# Patient Record
Sex: Female | Born: 1935
Health system: Southern US, Community
[De-identification: ages and names within clinical notes are randomized; demographics above are authoritative.]

## PROBLEM LIST (undated history)

## (undated) DIAGNOSIS — I639 Cerebral infarction, unspecified: Secondary | ICD-10-CM

## (undated) DIAGNOSIS — M949 Disorder of cartilage, unspecified: Secondary | ICD-10-CM

## (undated) DIAGNOSIS — K589 Irritable bowel syndrome without diarrhea: Secondary | ICD-10-CM

## (undated) DIAGNOSIS — I2510 Atherosclerotic heart disease of native coronary artery without angina pectoris: Secondary | ICD-10-CM

## (undated) DIAGNOSIS — I4729 Other ventricular tachycardia: Secondary | ICD-10-CM

## (undated) DIAGNOSIS — M899 Disorder of bone, unspecified: Secondary | ICD-10-CM

## (undated) DIAGNOSIS — Z9889 Other specified postprocedural states: Secondary | ICD-10-CM

## (undated) DIAGNOSIS — I5022 Chronic systolic (congestive) heart failure: Secondary | ICD-10-CM

## (undated) DIAGNOSIS — M1990 Unspecified osteoarthritis, unspecified site: Secondary | ICD-10-CM

## (undated) DIAGNOSIS — R112 Nausea with vomiting, unspecified: Secondary | ICD-10-CM

## (undated) DIAGNOSIS — I10 Essential (primary) hypertension: Secondary | ICD-10-CM

## (undated) DIAGNOSIS — I447 Left bundle-branch block, unspecified: Secondary | ICD-10-CM

## (undated) DIAGNOSIS — I351 Nonrheumatic aortic (valve) insufficiency: Secondary | ICD-10-CM

## (undated) DIAGNOSIS — E559 Vitamin D deficiency, unspecified: Secondary | ICD-10-CM

## (undated) DIAGNOSIS — K219 Gastro-esophageal reflux disease without esophagitis: Secondary | ICD-10-CM

## (undated) DIAGNOSIS — R55 Syncope and collapse: Secondary | ICD-10-CM

## (undated) DIAGNOSIS — R7303 Prediabetes: Secondary | ICD-10-CM

## (undated) DIAGNOSIS — T4145XA Adverse effect of unspecified anesthetic, initial encounter: Secondary | ICD-10-CM

## (undated) DIAGNOSIS — J309 Allergic rhinitis, unspecified: Secondary | ICD-10-CM

## (undated) DIAGNOSIS — I472 Ventricular tachycardia: Secondary | ICD-10-CM

## (undated) DIAGNOSIS — I4891 Unspecified atrial fibrillation: Secondary | ICD-10-CM

## (undated) DIAGNOSIS — E785 Hyperlipidemia, unspecified: Secondary | ICD-10-CM

## (undated) DIAGNOSIS — H269 Unspecified cataract: Secondary | ICD-10-CM

## (undated) DIAGNOSIS — M7700 Medial epicondylitis, unspecified elbow: Secondary | ICD-10-CM

## (undated) HISTORY — DX: Disorder of cartilage, unspecified: M94.9

## (undated) HISTORY — DX: Medial epicondylitis, unspecified elbow: M77.00

## (undated) HISTORY — DX: Cerebral infarction, unspecified: I63.9

## (undated) HISTORY — DX: Ventricular tachycardia: I47.2

## (undated) HISTORY — PX: REPLACEMENT TOTAL KNEE: SUR1224

## (undated) HISTORY — DX: Hyperlipidemia, unspecified: E78.5

## (undated) HISTORY — DX: Unspecified cataract: H26.9

## (undated) HISTORY — DX: Other ventricular tachycardia: I47.29

## (undated) HISTORY — DX: Left bundle-branch block, unspecified: I44.7

## (undated) HISTORY — DX: Chronic systolic (congestive) heart failure: I50.22

## (undated) HISTORY — DX: Disorder of bone, unspecified: M89.9

## (undated) HISTORY — DX: Syncope and collapse: R55

## (undated) HISTORY — DX: Vitamin D deficiency, unspecified: E55.9

## (undated) HISTORY — DX: Atherosclerotic heart disease of native coronary artery without angina pectoris: I25.10

## (undated) HISTORY — DX: Essential (primary) hypertension: I10

## (undated) HISTORY — DX: Unspecified osteoarthritis, unspecified site: M19.90

## (undated) HISTORY — DX: Allergic rhinitis, unspecified: J30.9

## (undated) HISTORY — DX: Gastro-esophageal reflux disease without esophagitis: K21.9

## (undated) HISTORY — DX: Nonrheumatic aortic (valve) insufficiency: I35.1

## (undated) HISTORY — DX: Unspecified atrial fibrillation: I48.91

## (undated) HISTORY — PX: RADICAL HYSTERECTOMY: SHX2283

## (undated) HISTORY — DX: Irritable bowel syndrome, unspecified: K58.9

---

## 1999-01-03 ENCOUNTER — Ambulatory Visit (HOSPITAL_COMMUNITY): Admission: RE | Admit: 1999-01-03 | Discharge: 1999-01-03 | Payer: Self-pay | Admitting: Family Medicine

## 1999-01-03 ENCOUNTER — Encounter: Payer: Self-pay | Admitting: Family Medicine

## 2000-01-22 ENCOUNTER — Ambulatory Visit (HOSPITAL_COMMUNITY): Admission: RE | Admit: 2000-01-22 | Discharge: 2000-01-22 | Payer: Self-pay | Admitting: Family Medicine

## 2000-01-22 ENCOUNTER — Encounter: Payer: Self-pay | Admitting: Family Medicine

## 2000-04-26 ENCOUNTER — Ambulatory Visit (HOSPITAL_COMMUNITY): Admission: RE | Admit: 2000-04-26 | Discharge: 2000-04-26 | Payer: Self-pay | Admitting: Gastroenterology

## 2000-04-26 ENCOUNTER — Encounter: Payer: Self-pay | Admitting: Gastroenterology

## 2001-01-29 ENCOUNTER — Encounter: Payer: Self-pay | Admitting: Family Medicine

## 2001-01-29 ENCOUNTER — Ambulatory Visit (HOSPITAL_COMMUNITY): Admission: RE | Admit: 2001-01-29 | Discharge: 2001-01-29 | Payer: Self-pay | Admitting: Family Medicine

## 2002-02-11 ENCOUNTER — Encounter: Admission: RE | Admit: 2002-02-11 | Discharge: 2002-02-11 | Payer: Self-pay | Admitting: Family Medicine

## 2002-02-11 ENCOUNTER — Ambulatory Visit (HOSPITAL_COMMUNITY): Admission: RE | Admit: 2002-02-11 | Discharge: 2002-02-11 | Payer: Self-pay | Admitting: Family Medicine

## 2002-02-11 ENCOUNTER — Encounter: Payer: Self-pay | Admitting: Family Medicine

## 2003-02-04 ENCOUNTER — Ambulatory Visit (HOSPITAL_COMMUNITY): Admission: RE | Admit: 2003-02-04 | Discharge: 2003-02-04 | Payer: Self-pay | Admitting: Gastroenterology

## 2003-04-14 ENCOUNTER — Ambulatory Visit (HOSPITAL_COMMUNITY): Admission: RE | Admit: 2003-04-14 | Discharge: 2003-04-14 | Payer: Self-pay | Admitting: Family Medicine

## 2003-04-14 ENCOUNTER — Encounter: Payer: Self-pay | Admitting: Family Medicine

## 2004-06-21 ENCOUNTER — Ambulatory Visit (HOSPITAL_COMMUNITY): Admission: RE | Admit: 2004-06-21 | Discharge: 2004-06-21 | Payer: Self-pay | Admitting: Family Medicine

## 2004-11-15 ENCOUNTER — Encounter: Admission: RE | Admit: 2004-11-15 | Discharge: 2004-11-15 | Payer: Self-pay | Admitting: Family Medicine

## 2005-07-24 ENCOUNTER — Ambulatory Visit (HOSPITAL_COMMUNITY): Admission: RE | Admit: 2005-07-24 | Discharge: 2005-07-24 | Payer: Self-pay | Admitting: Family Medicine

## 2006-06-14 ENCOUNTER — Ambulatory Visit: Payer: Self-pay | Admitting: Family Medicine

## 2006-06-26 ENCOUNTER — Encounter: Payer: Self-pay | Admitting: Family Medicine

## 2006-06-26 LAB — CONVERTED CEMR LAB: LDL Cholesterol: 133 mg/dL

## 2006-07-16 DIAGNOSIS — M810 Age-related osteoporosis without current pathological fracture: Secondary | ICD-10-CM | POA: Insufficient documentation

## 2006-07-16 DIAGNOSIS — K219 Gastro-esophageal reflux disease without esophagitis: Secondary | ICD-10-CM

## 2006-07-16 DIAGNOSIS — I10 Essential (primary) hypertension: Secondary | ICD-10-CM | POA: Insufficient documentation

## 2006-07-16 DIAGNOSIS — K589 Irritable bowel syndrome without diarrhea: Secondary | ICD-10-CM | POA: Insufficient documentation

## 2006-07-16 DIAGNOSIS — M899 Disorder of bone, unspecified: Secondary | ICD-10-CM | POA: Insufficient documentation

## 2006-07-16 DIAGNOSIS — J309 Allergic rhinitis, unspecified: Secondary | ICD-10-CM | POA: Insufficient documentation

## 2006-07-16 DIAGNOSIS — M949 Disorder of cartilage, unspecified: Secondary | ICD-10-CM

## 2006-07-29 ENCOUNTER — Ambulatory Visit (HOSPITAL_COMMUNITY): Admission: RE | Admit: 2006-07-29 | Discharge: 2006-07-29 | Payer: Self-pay | Admitting: Family Medicine

## 2006-08-13 ENCOUNTER — Encounter: Payer: Self-pay | Admitting: Family Medicine

## 2006-08-13 ENCOUNTER — Ambulatory Visit: Payer: Self-pay | Admitting: Family Medicine

## 2006-08-13 DIAGNOSIS — E785 Hyperlipidemia, unspecified: Secondary | ICD-10-CM

## 2006-08-13 LAB — CONVERTED CEMR LAB
Cholesterol, target level: 200 mg/dL
HDL goal, serum: 40 mg/dL

## 2006-10-15 ENCOUNTER — Encounter: Payer: Self-pay | Admitting: Family Medicine

## 2006-11-28 ENCOUNTER — Encounter: Payer: Self-pay | Admitting: Family Medicine

## 2006-12-04 ENCOUNTER — Ambulatory Visit: Payer: Self-pay | Admitting: Family Medicine

## 2007-01-01 ENCOUNTER — Telehealth (INDEPENDENT_AMBULATORY_CARE_PROVIDER_SITE_OTHER): Payer: Self-pay | Admitting: *Deleted

## 2007-01-01 ENCOUNTER — Ambulatory Visit: Payer: Self-pay | Admitting: Family Medicine

## 2007-01-01 DIAGNOSIS — M19071 Primary osteoarthritis, right ankle and foot: Secondary | ICD-10-CM

## 2007-01-01 DIAGNOSIS — M19072 Primary osteoarthritis, left ankle and foot: Secondary | ICD-10-CM

## 2007-01-03 ENCOUNTER — Telehealth (INDEPENDENT_AMBULATORY_CARE_PROVIDER_SITE_OTHER): Payer: Self-pay | Admitting: *Deleted

## 2007-01-15 ENCOUNTER — Telehealth: Payer: Self-pay | Admitting: Family Medicine

## 2007-01-23 ENCOUNTER — Encounter: Payer: Self-pay | Admitting: Vascular Surgery

## 2007-01-23 ENCOUNTER — Ambulatory Visit: Payer: Self-pay | Admitting: Vascular Surgery

## 2007-01-23 ENCOUNTER — Ambulatory Visit (HOSPITAL_COMMUNITY): Admission: RE | Admit: 2007-01-23 | Discharge: 2007-01-23 | Payer: Self-pay | Admitting: Family Medicine

## 2007-01-24 ENCOUNTER — Encounter: Payer: Self-pay | Admitting: Family Medicine

## 2007-01-24 ENCOUNTER — Telehealth (INDEPENDENT_AMBULATORY_CARE_PROVIDER_SITE_OTHER): Payer: Self-pay | Admitting: *Deleted

## 2007-01-24 LAB — CONVERTED CEMR LAB: Creatinine, Ser: 0.74 mg/dL (ref 0.40–1.20)

## 2007-01-27 ENCOUNTER — Telehealth (INDEPENDENT_AMBULATORY_CARE_PROVIDER_SITE_OTHER): Payer: Self-pay | Admitting: *Deleted

## 2007-02-18 ENCOUNTER — Ambulatory Visit: Payer: Self-pay | Admitting: Family Medicine

## 2007-02-20 ENCOUNTER — Telehealth: Payer: Self-pay | Admitting: Family Medicine

## 2007-05-16 ENCOUNTER — Ambulatory Visit: Payer: Self-pay | Admitting: Family Medicine

## 2007-05-16 LAB — CONVERTED CEMR LAB: Rapid Strep: NEGATIVE

## 2007-05-19 ENCOUNTER — Encounter: Admission: RE | Admit: 2007-05-19 | Discharge: 2007-05-19 | Payer: Self-pay | Admitting: Orthopedic Surgery

## 2007-05-19 ENCOUNTER — Encounter: Payer: Self-pay | Admitting: Family Medicine

## 2007-05-26 ENCOUNTER — Telehealth: Payer: Self-pay | Admitting: Family Medicine

## 2007-06-26 ENCOUNTER — Ambulatory Visit: Payer: Self-pay | Admitting: Family Medicine

## 2007-06-27 ENCOUNTER — Encounter: Payer: Self-pay | Admitting: Family Medicine

## 2007-06-27 LAB — CONVERTED CEMR LAB
Albumin: 4 g/dL (ref 3.5–5.2)
Alkaline Phosphatase: 62 units/L (ref 39–117)
BUN: 16 mg/dL (ref 6–23)
CO2: 24 meq/L (ref 19–32)
Glucose, Bld: 90 mg/dL (ref 70–99)
HDL: 65 mg/dL (ref >39)
Potassium: 3.9 meq/L (ref 3.5–5.3)
TSH: 1.909 microintl units/mL (ref 0.350–5.50)
Total Bilirubin: 0.6 mg/dL (ref 0.3–1.2)
Total Protein: 7.3 g/dL (ref 6.0–8.3)
VLDL: 16 mg/dL (ref 0–40)

## 2007-08-05 ENCOUNTER — Ambulatory Visit: Payer: Self-pay | Admitting: Family Medicine

## 2007-08-06 ENCOUNTER — Encounter: Payer: Self-pay | Admitting: Family Medicine

## 2007-08-06 ENCOUNTER — Encounter: Admission: RE | Admit: 2007-08-06 | Discharge: 2007-08-06 | Payer: Self-pay | Admitting: Family Medicine

## 2007-09-19 ENCOUNTER — Telehealth (INDEPENDENT_AMBULATORY_CARE_PROVIDER_SITE_OTHER): Payer: Self-pay | Admitting: *Deleted

## 2007-09-19 ENCOUNTER — Encounter: Payer: Self-pay | Admitting: Family Medicine

## 2007-09-19 ENCOUNTER — Ambulatory Visit: Payer: Self-pay | Admitting: Family Medicine

## 2007-09-19 DIAGNOSIS — R9431 Abnormal electrocardiogram [ECG] [EKG]: Secondary | ICD-10-CM

## 2007-09-24 ENCOUNTER — Ambulatory Visit: Payer: Self-pay

## 2007-09-24 ENCOUNTER — Encounter: Payer: Self-pay | Admitting: Family Medicine

## 2007-09-26 ENCOUNTER — Ambulatory Visit: Payer: Self-pay

## 2007-10-09 DIAGNOSIS — T8859XA Other complications of anesthesia, initial encounter: Secondary | ICD-10-CM

## 2007-10-09 HISTORY — DX: Other complications of anesthesia, initial encounter: T88.59XA

## 2007-10-15 ENCOUNTER — Telehealth: Payer: Self-pay | Admitting: Family Medicine

## 2007-10-29 ENCOUNTER — Telehealth: Payer: Self-pay | Admitting: Family Medicine

## 2007-11-03 ENCOUNTER — Encounter: Payer: Self-pay | Admitting: Family Medicine

## 2007-11-03 ENCOUNTER — Inpatient Hospital Stay (HOSPITAL_COMMUNITY): Admission: RE | Admit: 2007-11-03 | Discharge: 2007-11-06 | Payer: Self-pay | Admitting: Orthopedic Surgery

## 2008-01-07 ENCOUNTER — Ambulatory Visit: Payer: Self-pay | Admitting: Family Medicine

## 2008-01-08 ENCOUNTER — Encounter: Payer: Self-pay | Admitting: Family Medicine

## 2008-01-08 LAB — CONVERTED CEMR LAB
ALT: 15 units/L (ref 0–35)
AST: 17 units/L (ref 0–37)
Albumin: 4.4 g/dL (ref 3.5–5.2)
Alkaline Phosphatase: 79 units/L (ref 39–117)
Calcium: 10 mg/dL (ref 8.4–10.5)
Cholesterol: 218 mg/dL — ABNORMAL HIGH (ref 0–200)
Sodium: 139 meq/L (ref 135–145)
Total Bilirubin: 0.5 mg/dL (ref 0.3–1.2)
Total CHOL/HDL Ratio: 3.9
Total Protein: 7.9 g/dL (ref 6.0–8.3)
VLDL: 25 mg/dL (ref 0–40)

## 2008-03-29 ENCOUNTER — Inpatient Hospital Stay (HOSPITAL_COMMUNITY): Admission: RE | Admit: 2008-03-29 | Discharge: 2008-04-01 | Payer: Self-pay | Admitting: Orthopedic Surgery

## 2008-04-19 ENCOUNTER — Encounter: Payer: Self-pay | Admitting: Family Medicine

## 2008-04-28 ENCOUNTER — Ambulatory Visit: Payer: Self-pay | Admitting: Family Medicine

## 2008-04-29 ENCOUNTER — Encounter: Admission: RE | Admit: 2008-04-29 | Discharge: 2008-07-28 | Payer: Self-pay | Admitting: Orthopedic Surgery

## 2008-05-11 ENCOUNTER — Ambulatory Visit: Payer: Self-pay | Admitting: Family Medicine

## 2008-08-10 ENCOUNTER — Ambulatory Visit: Payer: Self-pay | Admitting: Family Medicine

## 2008-08-23 ENCOUNTER — Encounter: Admission: RE | Admit: 2008-08-23 | Discharge: 2008-08-23 | Payer: Self-pay | Admitting: Family Medicine

## 2008-09-29 ENCOUNTER — Encounter: Payer: Self-pay | Admitting: Family Medicine

## 2008-10-27 ENCOUNTER — Encounter: Payer: Self-pay | Admitting: Family Medicine

## 2009-07-25 ENCOUNTER — Ambulatory Visit: Payer: Self-pay | Admitting: Family Medicine

## 2009-07-26 LAB — CONVERTED CEMR LAB
AST: 19 units/L (ref 0–37)
BUN: 21 mg/dL (ref 6–23)
CO2: 23 meq/L (ref 19–32)
Creatinine, Ser: 0.72 mg/dL (ref 0.40–1.20)
Glucose, Bld: 104 mg/dL — ABNORMAL HIGH (ref 70–99)
Sodium: 142 meq/L (ref 135–145)
TSH: 1.872 microintl units/mL (ref 0.350–4.500)
Total Protein: 7.5 g/dL (ref 6.0–8.3)
VLDL: 18 mg/dL (ref 0–40)

## 2009-09-13 ENCOUNTER — Encounter: Admission: RE | Admit: 2009-09-13 | Discharge: 2009-09-13 | Payer: Self-pay | Admitting: Family Medicine

## 2009-09-21 ENCOUNTER — Encounter: Payer: Self-pay | Admitting: Family Medicine

## 2009-10-20 ENCOUNTER — Ambulatory Visit: Payer: Self-pay | Admitting: Family Medicine

## 2009-10-20 DIAGNOSIS — M7700 Medial epicondylitis, unspecified elbow: Secondary | ICD-10-CM

## 2009-11-24 ENCOUNTER — Ambulatory Visit: Payer: Self-pay | Admitting: Family Medicine

## 2009-12-06 ENCOUNTER — Encounter: Payer: Self-pay | Admitting: Family Medicine

## 2009-12-07 LAB — CONVERTED CEMR LAB
BUN: 20 mg/dL (ref 6–23)
CO2: 24 meq/L (ref 19–32)
Calcium: 10.1 mg/dL (ref 8.4–10.5)
Chloride: 103 meq/L (ref 96–112)
Creatinine, Ser: 0.69 mg/dL (ref 0.40–1.20)
Glucose, Bld: 103 mg/dL — ABNORMAL HIGH (ref 70–99)
Potassium: 4.3 meq/L (ref 3.5–5.3)
Sodium: 140 meq/L (ref 135–145)

## 2009-12-19 ENCOUNTER — Ambulatory Visit: Payer: Self-pay | Admitting: Family Medicine

## 2010-01-13 ENCOUNTER — Encounter: Payer: Self-pay | Admitting: Family Medicine

## 2010-01-14 LAB — CONVERTED CEMR LAB
Creatinine, Ser: 0.61 mg/dL (ref 0.40–1.20)
Potassium: 4.2 meq/L (ref 3.5–5.3)
Sodium: 138 meq/L (ref 135–145)

## 2010-02-06 ENCOUNTER — Telehealth: Payer: Self-pay | Admitting: Family Medicine

## 2010-02-13 ENCOUNTER — Encounter: Payer: Self-pay | Admitting: Family Medicine

## 2010-02-28 ENCOUNTER — Ambulatory Visit: Payer: Self-pay | Admitting: Family

## 2010-02-28 DIAGNOSIS — R609 Edema, unspecified: Secondary | ICD-10-CM | POA: Insufficient documentation

## 2010-03-15 ENCOUNTER — Ambulatory Visit: Payer: Self-pay | Admitting: Family Medicine

## 2010-04-20 DIAGNOSIS — H269 Unspecified cataract: Secondary | ICD-10-CM

## 2010-04-20 DIAGNOSIS — M1990 Unspecified osteoarthritis, unspecified site: Secondary | ICD-10-CM | POA: Insufficient documentation

## 2010-04-24 ENCOUNTER — Ambulatory Visit: Payer: Self-pay | Admitting: Cardiovascular Disease

## 2010-04-24 DIAGNOSIS — R0602 Shortness of breath: Secondary | ICD-10-CM | POA: Insufficient documentation

## 2010-04-24 DIAGNOSIS — R55 Syncope and collapse: Secondary | ICD-10-CM

## 2010-05-10 ENCOUNTER — Encounter: Payer: Self-pay | Admitting: Family Medicine

## 2010-05-22 ENCOUNTER — Encounter: Payer: Self-pay | Admitting: Family Medicine

## 2010-05-29 ENCOUNTER — Ambulatory Visit: Payer: Self-pay

## 2010-05-29 ENCOUNTER — Ambulatory Visit (HOSPITAL_COMMUNITY): Admission: RE | Admit: 2010-05-29 | Discharge: 2010-05-29 | Payer: Self-pay | Admitting: Cardiovascular Disease

## 2010-05-29 ENCOUNTER — Ambulatory Visit: Payer: Self-pay | Admitting: Cardiovascular Disease

## 2010-05-29 ENCOUNTER — Encounter: Payer: Self-pay | Admitting: Cardiovascular Disease

## 2010-05-29 ENCOUNTER — Encounter (INDEPENDENT_AMBULATORY_CARE_PROVIDER_SITE_OTHER): Payer: Self-pay

## 2010-05-29 ENCOUNTER — Ambulatory Visit: Payer: Self-pay | Admitting: Internal Medicine

## 2010-05-30 ENCOUNTER — Encounter: Payer: Self-pay | Admitting: Cardiovascular Disease

## 2010-05-31 ENCOUNTER — Ambulatory Visit: Payer: Self-pay | Admitting: Cardiovascular Disease

## 2010-05-31 ENCOUNTER — Encounter: Payer: Self-pay | Admitting: Cardiovascular Disease

## 2010-05-31 DIAGNOSIS — I472 Ventricular tachycardia: Secondary | ICD-10-CM

## 2010-05-31 LAB — CONVERTED CEMR LAB
BUN: 22 mg/dL (ref 6–23)
Basophils Absolute: 0 10*3/uL (ref 0.0–0.1)
Basophils Relative: 0.1 % (ref 0.0–3.0)
CO2: 28 meq/L (ref 19–32)
Chloride: 102 meq/L (ref 96–112)
Eosinophils Relative: 2.1 % (ref 0.0–5.0)
Glucose, Bld: 86 mg/dL (ref 70–99)
Hemoglobin: 13.1 g/dL (ref 12.0–15.0)
Lymphocytes Relative: 32.3 % (ref 12.0–46.0)
Lymphs Abs: 1.8 10*3/uL (ref 0.7–4.0)
MCV: 89.8 fL (ref 78.0–100.0)
Neutro Abs: 3.2 10*3/uL (ref 1.4–7.7)
Potassium: 3.5 meq/L (ref 3.5–5.1)
RBC: 4.2 M/uL (ref 3.87–5.11)
RDW: 13.3 % (ref 11.5–14.6)
WBC: 5.5 10*3/uL (ref 4.5–10.5)

## 2010-06-07 ENCOUNTER — Inpatient Hospital Stay (HOSPITAL_BASED_OUTPATIENT_CLINIC_OR_DEPARTMENT_OTHER): Admission: RE | Admit: 2010-06-07 | Discharge: 2010-06-07 | Payer: Self-pay | Admitting: Cardiovascular Disease

## 2010-06-07 ENCOUNTER — Ambulatory Visit: Payer: Self-pay | Admitting: Cardiovascular Disease

## 2010-07-03 ENCOUNTER — Ambulatory Visit: Payer: Self-pay | Admitting: Internal Medicine

## 2010-07-24 ENCOUNTER — Encounter: Payer: Self-pay | Admitting: Family Medicine

## 2010-10-08 HISTORY — PX: CATARACT EXTRACTION, BILATERAL: SHX1313

## 2010-10-26 ENCOUNTER — Ambulatory Visit
Admission: RE | Admit: 2010-10-26 | Discharge: 2010-10-26 | Payer: Self-pay | Source: Home / Self Care | Attending: Cardiovascular Disease | Admitting: Cardiovascular Disease

## 2010-10-31 ENCOUNTER — Ambulatory Visit
Admission: RE | Admit: 2010-10-31 | Discharge: 2010-10-31 | Payer: Self-pay | Source: Home / Self Care | Attending: Family Medicine | Admitting: Family Medicine

## 2010-10-31 DIAGNOSIS — E559 Vitamin D deficiency, unspecified: Secondary | ICD-10-CM | POA: Insufficient documentation

## 2010-11-03 ENCOUNTER — Other Ambulatory Visit: Payer: Self-pay | Admitting: Family Medicine

## 2010-11-03 DIAGNOSIS — Z1239 Encounter for other screening for malignant neoplasm of breast: Secondary | ICD-10-CM

## 2010-11-06 ENCOUNTER — Encounter: Payer: Self-pay | Admitting: Family Medicine

## 2010-11-07 NOTE — Assessment & Plan Note (Signed)
Summary: CARDIAC EVAUL/MT   Primary Provider:  [name] MD  CC:  BP issues .  History of Present Illness: 75 yo overweight patient with HTN, elevated lipids and borderline DM.  Recent dizzyness and near syncope while on amlodipine.  Previous preop eval with normal myovue 2008.  Reviewed ECG from then and normal with no conduction defect.  ECG no with LBBB.  Has chronic dyspnea likely due to obesity.  Chronic LE edema with venous insuf on lasix.  Started on bystolic after she didnt' tolerate amlodipine.  Denies SSCP but has significant dyspnea, with abnormal ECG and multiple CRF's.  No recurrent dizzyness while of Calcium blocker.    Current Problems (verified): 1)  Syncope  (ICD-780.2) 2)  Dyspnea  (ICD-786.05) 3)  Hyperlipidemia, Mild  (ICD-272.4) 4)  Hypertension, Benign Systemic  (ICD-401.1) 5)  Edema  (ICD-782.3) 6)  Medial Epicondylitis  (ICD-726.31) 7)  Electrocardiogram, Abnormal  (ICD-794.31) 8)  Osteoarthrosis Nos, Lower Leg  (ICD-715.96) 9)  Rhinitis, Allergic  (ICD-477.9) 10)  Osteopenia  (ICD-733.90) 11)  Irritable Bowel Syndrome  (ICD-564.1) 12)  Gastroesophageal Reflux, No Esophagitis  (ICD-530.81) 13)  Cataracts  (ICD-366.9) 14)  Osteoarthritis  (ICD-715.90)  Current Medications (verified): 1)  Fluticasone Propionate 50 Mcg/act Susp (Fluticasone Propionate) .... Two Sprays Each Nostril Daily 2)  Aspirin 325 Mg Tabs (Aspirin) .Marland Kitchen.. 1 Once Daily 3)  Losartan Potassium-Hctz 100-25 Mg Tabs (Losartan Potassium-Hctz) .... One Tablet By Mouth Daily 4)  Cetirizine Hcl 10 Mg Tabs (Cetirizine Hcl) .... Take 1 Tablet By Mouth Once A Day 5)  Metamucil 30.9 % Powd (Psyllium) .Marland Kitchen.. 1 Rounded Tsp 6)  Multivitamins  Tabs (Multiple Vitamin) .... Take 1 Tablet By Mouth Once A Day 7)  Bystolic 10 Mg Tabs (Nebivolol Hcl) .Marland Kitchen.. 1 Tab By Mouth Once Daily 8)  Calcium-Magnesium-Zinc 333-133-8.3 Mg Tabs (Calcium-Magnesium-Zinc) .... Take 4 Tablets A Day By Mouth 9)  Vitamin D-1000 Max  St [phone] Mg-Unit Tabs (Calcium-Vitamin D) .Marland Kitchen.. 1 Every Other Day 10)  Fish Oil 1200 Mg Caps (Omega-3 Fatty Acids) .... Take 2 Tablets By Mouth in Am and Pm 11)  Cyanocobalamin 1000 Mcg Tabs (Cyanocobalamin) .... Take One Tablet By Mouth Once A Day 12)  Omeprazole 40 Mg Cpdr (Omeprazole) .... Take 1 Tablet By Mouth Once A Day 13)  Pravastatin Sodium 20 Mg Tabs (Pravastatin Sodium) .... Take 1 Tablet By Mouth Once A Day  At Bedtime 14)  Grape Seed Extract 146-60 Mg Tabs (Misc Natural Products) .... Take One Tablet By Mouth Twice A Day 15)  Glucosamine Sulfate 750 Mg Caps (Glucosamine Sulfate) .... Take One Tablet By Mouth Once A Day 16)  Lasix 20 Mg Tabs (Furosemide) .... Take 1 Tablet By Mouth Once A Day in The Am Only As Needed For Lower Extremity Swelling  Allergies (verified): 1)  ! Exforge (Amlodipine Besylate-Valsartan)  Past History:  Past Medical History: Last updated: 01/01/2007 ABIs on 11-28-06, Right 1.4, left 1.35  Past Surgical History: Last updated: 08/10/2008 Total Hysterectomy -  Right Total KNee Replacement 11-03-07 Left knee replacement 03-29-08  Family History: Last updated: 08/10/2008 Colon Ca-Father  Parent-DM, hi chol, HTN, afib  mother with alzheimers, afib  Father with MI at age 45 Sister with DM, afib  Social History: Last updated: 06/26/2007 Retired from [employer].  12 yrs education.  Divorced from [employer].  One child.  Quit smoking 1972, no EtOH, no drugs, 2 caff/day, works out in the yard.   Review of Systems  Denies fever, malais, weight loss, blurry vision, decreased visual acuity, cough, sputum, SOB, hemoptysis, pleuritic pain, palpitaitons, heartburn, abdominal pain, melena, lower extremity edema, claudication, or rash.   Vital Signs:  Patient profile:   75 year old female Height:      62 inches Weight:      279 pounds BMI:     51.21 Pulse rate:   60 / minute Resp:     16 per minute BP sitting:   156 / 85  (left arm)  Vitals  Entered By: Kem Parkinson (April 24, 2010 9:02 AM)  Physical Exam  General:  Affect appropriate Healthy:  appears stated age HEENT: normal Neck supple with no adenopathy JVP normal no bruits no thyromegaly Lungs clear with no wheezing and good diaphragmatic motion Heart:  S1/S2 no murmur,rub, gallop or click PMI normal Abdomen: benighn, BS positve, no tenderness, no AAA no bruit.  No HSM or HJR Distal pulses intact with no bruits Plus 2 bilateral edema Neuro non-focal Skin warm and dry    Impression & Recommendations:  Problem # 1:  SYNCOPE (ICD-780.2) Temp related to amlodipine. New LBBB will have to monitor conduction system and assess for structural heart problems.  Avoid use of both calcium blker and BB.   Her updated medication list for this problem includes:    Aspirin 325 Mg Tabs (Aspirin) .Marland Kitchen... 1 once daily    Bystolic 10 Mg Tabs (Nebivolol hcl) .Marland Kitchen... 1 tab by mouth once daily  Orders: Echocardiogram (Echo)  Problem # 2:  DYSPNEA (ICD-786.05) Likely related to obesity but check echo and R/O anginal equiv with myovue Her updated medication list for this problem includes:    Aspirin 325 Mg Tabs (Aspirin) .Marland Kitchen... 1 once daily    Losartan Potassium-hctz 100-25 Mg Tabs (Losartan potassium-hctz) ..... One tablet by mouth daily    Bystolic 10 Mg Tabs (Nebivolol hcl) .Marland Kitchen... 1 tab by mouth once daily    Lasix 20 Mg Tabs (Furosemide) .Marland Kitchen... Take 1 tablet by mouth once a day in the am only as needed for lower extremity swelling  Orders: EKG w/ Interpretation (93000) Echocardiogram (Echo)  Her updated medication list for this problem includes:    Aspirin 325 Mg Tabs (Aspirin) .Marland Kitchen... 1 once daily    Losartan Potassium-hctz 100-25 Mg Tabs (Losartan potassium-hctz) ..... One tablet by mouth daily    Bystolic 10 Mg Tabs (Nebivolol hcl) .Marland Kitchen... 1 tab by mouth once daily    Lasix 20 Mg Tabs (Furosemide) .Marland Kitchen... Take 1 tablet by mouth once a day in the am only as needed for lower  extremity swelling  Problem # 3:  HYPERLIPIDEMIA, MILD (ICD-272.4) Continue statin Her updated medication list for this problem includes:    Pravastatin Sodium 20 Mg Tabs (Pravastatin sodium) .Marland Kitchen... Take 1 tablet by mouth once a day  at bedtime  CHOL: 189 (07/25/2009)   LDL: 111 (07/25/2009)   HDL: 60 (07/25/2009)   TG: 92 (07/25/2009) CHOL (goal): 200 (08/13/2006)   LDL (goal): 130 (08/13/2006)   HDL (goal): 40 (08/13/2006)   TG (goal): 150 (08/13/2006)  Problem # 4:  HYPERTENSION, BENIGN SYSTEMIC (ICD-401.1) Borderline control.  Will see what echo and myovue look like before making further recomendations Her updated medication list for this problem includes:    Aspirin 325 Mg Tabs (Aspirin) .Marland Kitchen... 1 once daily    Losartan Potassium-hctz 100-25 Mg Tabs (Losartan potassium-hctz) ..... One tablet by mouth daily    Bystolic 10 Mg Tabs (Nebivolol hcl) .Marland Kitchen... 1 tab by mouth  once daily    Lasix 20 Mg Tabs (Furosemide) .Marland Kitchen... Take 1 tablet by mouth once a day in the am only as needed for lower extremity swelling  Problem # 5:  EDEMA (ICD-782.3) Venous insuf.  Continue low sodium diet and lasix  Problem # 6:  ELECTROCARDIOGRAM, ABNORMAL (ICD-794.31) LBBB with PR 208  No idication for pacer but clearly her conduction system has changed since 2008.  Avoid further AV nodal blocking drugs Event monitor if any further "syncope" or dizzyness Her updated medication list for this problem includes:    Aspirin 325 Mg Tabs (Aspirin) .Marland Kitchen... 1 once daily    Bystolic 10 Mg Tabs (Nebivolol hcl) .Marland Kitchen... 1 tab by mouth once daily  Other Orders: Nuclear Stress Test (Nuc Stress Test)  Patient Instructions: 1)  Your physician recommends that you schedule a follow-up appointment in: AFTER TESTS 2)  Your physician recommends that you continue on your current medications as directed. Please refer to the Current Medication list given to you today. 3)  Your physician has requested that you have an echocardiogram.   Echocardiography is a painless test that uses sound waves to create images of your heart. It provides your doctor with information about the size and shape of your heart and how well your heart's chambers and valves are working.  This procedure takes approximately one hour. There are no restrictions for this procedure. 4)  Your physician has requested that you have an lexiscan stress myoview.  For further information please visit [email or web address]  Please follow instruction sheet, as given.   EKG Report  Procedure date:  04/24/2010  Findings:      NSR 60 LBBB new since 2008 PR 208 msec

## 2010-11-07 NOTE — Assessment & Plan Note (Signed)
Summary: f/u on BP, Metheney pt- jr   Vital Signs:  Patient profile:   75 year old female Height:      62 inches Weight:      278 pounds Pulse rate:   67 / minute BP sitting:   196 / 81  (left arm) Cuff size:   large  Vitals Entered By: Kathlene November (Feb 28, 2010 1:46 PM) CC: followup BP-stopped Amlodipine and Lasix and started back on Losartan/HCTZ 100/25mg    Primary Care Provider:  [name] MD  CC:  followup BP-stopped Amlodipine and Lasix and started back on Losartan/HCTZ 100/25mg .  History of Present Illness: Charlotte [name] is a 75 year old female with history of HTN who presents with complaint of LE edema.  1) Edema-  Patient notes that she has history of lower extremity edema for a long time. (s/p B TKR 2009).  Swelling became worse in March around the time of her last visit.  Patient has since discontinued amlodipine.  She reported that with the discontinuation of amlodipine her LE edema improved.  She then stopped the lasix as she felt that this was making her dizzy.  She started back on Losartan/HCTZ.  Dizziness is better.     2)Neuropathy-  She has seen neurology (Dr Kristian Covey [facility]).  Patient  was diagnosed with RLS and neuropathy. She was given rx for RLS med (pramipexole) which she hasn't yet filled.    Current Medications (verified): 1)  Fluticasone Propionate 50 Mcg/act Susp (Fluticasone Propionate) .... Two Sprays Each Nostril Daily 2)  Aspirin 81 Mg Tbec (Aspirin) .... Take 4 Tablet By Mouth Once A Day 3)  Losartan Potassium 100 Mg Tabs (Losartan Potassium) .... Take 1 Tablet By Mouth Once A Day 4)  Cetirizine Hcl 10 Mg Tabs (Cetirizine Hcl) .... Take 1 Tablet By Mouth Once A Day 5)  Metamucil Tablets .... Take 3 Tablets By Mouth Once A Day 6)  Multivitamins  Tabs (Multiple Vitamin) .... Take 1 Tablet By Mouth Once A Day 7)  Corgard 40 Mg  Tabs (Nadolol) .Marland Kitchen.. 1 Tab By Mouth Daily 8)  Calcium-Magnesium-Zinc 333-133-8.3 Mg Tabs  (Calcium-Magnesium-Zinc) .... Take 4 Tablets A Day By Mouth 9)  Vitamin D-1000 Max St [phone] Mg-Unit Tabs (Calcium-Vitamin D) .... Take One Tablet By Mouth Once A Day 10)  Fish Oil 1200 Mg Caps (Omega-3 Fatty Acids) .... Take 2 Tablets By Mouth in Am and Pm 11)  Cyanocobalamin 1000 Mcg Tabs (Cyanocobalamin) .... Take One Tablet By Mouth Once A Day 12)  Omeprazole 40 Mg Cpdr (Omeprazole) .... Take 1 Tablet By Mouth Once A Day 13)  Pravastatin Sodium 20 Mg Tabs (Pravastatin Sodium) .... Take 1 Tablet By Mouth Once A Day  At Bedtime 14)  Grape Seed Extract 146-60 Mg Tabs (Misc Natural Products) .... Take One Tablet By Mouth Twice A Day 15)  Glucosamine Sulfate 750 Mg Caps (Glucosamine Sulfate) .... Take One Tablet By Mouth Once A Day 16)  Lasix 20 Mg Tabs (Furosemide) .... Take 1 Tablet By Mouth Once A Day in The Am. 17)  Losartan Potassium-Hctz 100-25 Mg Tabs (Losartan Potassium-Hctz) .... Take One Tablet By Mouth Oncea  Day  Allergies (verified): No Known Drug Allergies  Comments:  Nurse/Medical Assistant: The patient's medications and allergies were reviewed with the patient and were updated in the Medication and Allergy Lists. Kathlene November (Feb 28, 2010 1:47 PM)  Physical Exam  General:  Well-developed,well-nourished,in no acute distress; alert,appropriate and cooperative throughout examination Head:  Normocephalic  and atraumatic without obvious abnormalities. No apparent alopecia or balding. Lungs:  Normal respiratory effort, chest expands symmetrically. Lungs are clear to auscultation, no crackles or wheezes. Heart:  Normal rate and regular rhythm. S1 and S2 normal without gallop, murmur, click, rub or other extra sounds. Extremities:  trace left pedal edema and trace right pedal edema.     Impression & Recommendations:  Problem # 1:  HYPERTENSION, BENIGN SYSTEMIC (ICD-401.1) Assessment Deteriorated Follow up manual BP check in the room was 160/90.  Patient is on ARB, BB,  Diuretic (CCB removed due to edema).  Patient has been on Corgard 40 mg.  HR currently in 60's, therefore hesitant to increase to 80 mg given patient's recent issues with dizziness.  Will change Corgard  to Bystolic and plan upward titration as HR allows.  Pt to follow up in 2 weeks for nurse visit. The following medications were removed from the medication list:    Amlodipine Besylate 5 Mg Tabs (Amlodipine besylate) .Marland Kitchen... Take 1 tablet by mouth once a day Her updated medication list for this problem includes:    Losartan Potassium-hctz 100-25 Mg Tabs (Losartan potassium-hctz) ..... One tablet by mouth daily    Bystolic 5 Mg Tabs (Nebivolol hcl) ..... One tablet by mouth daily    Lasix 20 Mg Tabs (Furosemide) .Marland Kitchen... Take 1 tablet by mouth once a day in the am only as needed for lower extremity swelling  Problem # 2:  EDEMA (ICD-782.3) Assessment: Improved This was likely due to amlodipine and is now improved.  Pt instructed only to use Lasix on a as needed basis.  Continue daily HCTZ. Her updated medication list for this problem includes:    Losartan Potassium-hctz 100-25 Mg Tabs (Losartan potassium-hctz) ..... One tablet by mouth daily    Lasix 20 Mg Tabs (Furosemide) .Marland Kitchen... Take 1 tablet by mouth once a day in the am only as needed for lower extremity swelling  Complete Medication List: 1)  Fluticasone Propionate 50 Mcg/act Susp (Fluticasone propionate) .... Two sprays each nostril daily 2)  Aspirin 81 Mg Tbec (Aspirin) .... Take 4 tablet by mouth once a day 3)  Losartan Potassium-hctz 100-25 Mg Tabs (Losartan potassium-hctz) .... One tablet by mouth daily 4)  Cetirizine Hcl 10 Mg Tabs (Cetirizine hcl) .... Take 1 tablet by mouth once a day 5)  Metamucil Tablets  .... Take 3 tablets by mouth once a day 6)  Multivitamins Tabs (Multiple vitamin) .... Take 1 tablet by mouth once a day 7)  Bystolic 5 Mg Tabs (Nebivolol hcl) .... One tablet by mouth daily 8)  Calcium-magnesium-zinc 333-133-8.3 Mg  Tabs (Calcium-magnesium-zinc) .... Take 4 tablets a day by mouth 9)  Vitamin D-1000 Max St [phone] Mg-unit Tabs (Calcium-vitamin d) .... Take one tablet by mouth once a day 10)  Fish Oil 1200 Mg Caps (omega-3 Fatty Acids)  .... Take 2 tablets by mouth in am and pm 11)  Cyanocobalamin 1000 Mcg Tabs (Cyanocobalamin) .... Take one tablet by mouth once a day 12)  Omeprazole 40 Mg Cpdr (Omeprazole) .... Take 1 tablet by mouth once a day 13)  Pravastatin Sodium 20 Mg Tabs (Pravastatin sodium) .... Take 1 tablet by mouth once a day  at bedtime 14)  Grape Seed Extract 146-60 Mg Tabs (Misc natural products) .... Take one tablet by mouth twice a day 15)  Glucosamine Sulfate 750 Mg Caps (Glucosamine sulfate) .... Take one tablet by mouth once a day 16)  Lasix 20 Mg Tabs (Furosemide) .... Take 1 tablet  by mouth once a day in the am only as needed for lower extremity swelling  Patient Instructions: 1)  Please follow up in 2 weeks for a nurse visit (blood pressure check). 2)  Check blood pressure once daily at home and call if blood pressure is greater than 160/90. Prescriptions: BYSTOLIC 5 MG TABS (NEBIVOLOL HCL) one tablet by mouth daily  #30 x 0   Entered and Authorized by:   Lemont Fillers FNP   Signed by:   Lemont Fillers FNP on 02/28/2010   Method used:   Electronically to        [facility] [phone]* (retail)       [address]       [place], [place]  [address]       Ph: [phone]       Fax: [phone]   RxID:   [id number]

## 2010-11-07 NOTE — Cardiovascular Report (Signed)
Summary: Pre Cath Orders   Pre Cath Orders   Imported By: Roderic Ovens 06/05/2010 13:00:57  _____________________________________________________________________  External Attachment:    Type:   Image     Comment:   External Document

## 2010-11-07 NOTE — Consult Note (Signed)
Summary: [facility]  [facility]   Imported By: Lanelle Bal 04/06/2010 08:33:21  _____________________________________________________________________  External Attachment:    Type:   Image     Comment:   External Document

## 2010-11-07 NOTE — Miscellaneous (Signed)
Summary: Appointment Canceled  Appointment status changed to canceled by [employer] on 05/10/2010 2:24 PM.  Cancellation Comments --------------------- wt 279/lexiscan s/o//780.2/nichan/sec. horz/prec. req/ echo @ 11:30/saf  Appointment Information ----------------------- Appt Type:  CARDIOLOGY NUCLEAR TESTING      Date:  Thursday, May 11, 2010      Time:  12:30 PM for 15 min   Urgency:  Routine   Made By:  Pearson Grippe  To Visit:  [id number]    Reason:  wt 279/lexiscan s/o//780.2/nichan/sec. horz/prec. req/ echo @ 11:30/saf  Appt Comments ------------- -- 05/10/10 14:24: ([facility]EM[facility]) CANCELED -- wt 279/lexiscan s/o//780.2/nichan/sec. horz/prec. req/ echo @ 11:30/saf -- 05/10/10 11:52: ([facility]EM[facility]) BOOKED -- Routine CARDIOLOGY NUCLEAR TESTING at 05/11/2010 12:30 PM for 15 min wt 279/lexiscan s/o//780.2/nichan/sec. h

## 2010-11-07 NOTE — Assessment & Plan Note (Signed)
Summary: BP check  Nurse Visit   Vital Signs:  Patient profile:   75 year old female Pulse rate:   75 / minute BP sitting:   178 / 80  (left arm) Cuff size:   large  Vitals Entered By: Kathlene November (March 15, 2010 3:25 PM) CC: Follow-up HTN HPI: Taking Meds?yes Side Effects?no Chest Pain, SOB, Dizziness?no A/P: HTN(401.1) At Goal? If no, MD will be notified. Follow-up in--  5 minutes was spent with the pt. Pt at home readings are as follows: 141/68, 134/70, 98/65, 151/79, 119/61, 164/76   Allergies: No Known Drug Allergies  Orders Added: 1)  Est. Patient Level I [16109] Prescriptions: BYSTOLIC 10 MG TABS (NEBIVOLOL HCL) 1 tab by mouth once daily  #30 x 3   Entered and Authorized by:   Seymour Bars DO   Signed by:   Seymour Bars DO on 03/15/2010   Method used:   Electronically to        [facility] [phone]* (retail)       [address]       [place], [place]  [address]       Ph: [phone]       Fax: [phone]   RxID:   [id number]     Patient Instructions: 1)  Increase Bystolic to 10 mg/ day. 2)  I sent over new RX. 3)  Return for OV in 4 wks.

## 2010-11-07 NOTE — Miscellaneous (Signed)
Summary: Appointment Canceled  Appointment status changed to canceled by [employer] on 05/10/2010 11:30 AM.  Cancellation Comments --------------------- echo/780.2/sec. horz/no prec. req/myoview @ 12:30/saf  Appointment Information ----------------------- Appt Type:  CARDIOLOGY ANCILLARY VISIT      Date:  Thursday, May 11, 2010      Time:  11:30 AM for 60 min   Urgency:  Routine   Made By:  Pearson Grippe  To Visit:  [id number]    Reason:  echo/780.2/sec. horz/no prec. req/myoview @ 12:30/saf  Appt Comments ------------- -- 05/10/10 11:30: (CEM[facility]) CANCELED -- echo/780.2/sec. horz/no prec. req/myoview @ 12:30/saf -- 04/24/10 9:41: ([facility]EM[facility]) BOOKED -- Routine CARDIOLOGY ANCILLARY VISIT at 05/11/2010 11:30 AM for 60 min echo/780.2/sec. horz/no prec. req/myoview @ 12:30/saf

## 2010-11-07 NOTE — Assessment & Plan Note (Signed)
Summary: per check/427.31/saf   Visit Type:  Follow-up Primary Provider:  [name] MD   History of Present Illness: Charlotte [name] is referred today by Dr. Eden Emms for Dobutamine induced NSVT and ? syncope in the past in the setting of LBBB.  The patient does not think she actually passed out.  She was well until several months ago when she experienced an episode where she fell to the ground.  She does not think she lost conscousness.  She has never had another episode.  She denies c/p.  She underwent a Dobutamine stress test where she had NSVT. Subsequent cahteterization demonstrated no obstructive CAD and preserved LV function.  She has had poorly controlled HTN but as her episode occured while on amlodipine in the setting of her LBBB, it was stopped.  Current Medications (verified): 1)  Fluticasone Propionate 50 Mcg/act Susp (Fluticasone Propionate) .... Two Sprays Each Nostril Daily 2)  Aspirin 325 Mg Tabs (Aspirin) .Marland Kitchen.. 1 Once Daily 3)  Losartan Potassium-Hctz 100-25 Mg Tabs (Losartan Potassium-Hctz) .... One Tablet By Mouth Daily 4)  Cetirizine Hcl 10 Mg Tabs (Cetirizine Hcl) .... Take 1 Tablet By Mouth Once A Day 5)  Metamucil 30.9 % Powd (Psyllium) .Marland Kitchen.. 1 Rounded Tsp 6)  Multivitamins  Tabs (Multiple Vitamin) .... Take 1 Tablet By Mouth Once A Day 7)  Bystolic 10 Mg Tabs (Nebivolol Hcl) .Marland Kitchen.. 1 Tab By Mouth Once Daily 8)  Calcium-Magnesium-Zinc 333-133-8.3 Mg Tabs (Calcium-Magnesium-Zinc) .... Take 4 Tablets A Day By Mouth 9)  Vitamin D-1000 Max St [phone] Mg-Unit Tabs (Calcium-Vitamin D) .Marland Kitchen.. 1 Tab By Mouth Once Daily 10)  Fish Oil 1200 Mg Caps (Omega-3 Fatty Acids) .... Take 2 Tablets By Mouth in Am and Pm 11)  Cyanocobalamin 1000 Mcg Tabs (Cyanocobalamin) .... Take One Tablet By Mouth Once A Day 12)  Omeprazole 40 Mg Cpdr (Omeprazole) .... Take 1 Tablet By Mouth Once A Day 13)  Pravastatin Sodium 20 Mg Tabs (Pravastatin Sodium) .... Take 1 Tablet By Mouth Once A Day  At  Bedtime 14)  Grape Seed Extract 146-60 Mg Tabs (Misc Natural Products) .... Take One Tablet By Mouth Twice A Day 15)  Lasix 20 Mg Tabs (Furosemide) .... Take 1 Tablet By Mouth Once A Day in The Am Only As Needed For Lower Extremity Swelling 16)  Gabapentin 300 Mg Caps (Gabapentin) .Marland Kitchen.. 1  Tab By Mouth Once Daily  Allergies (verified): 1)  ! Exforge (Amlodipine Besylate-Valsartan)  Past History:  Past Medical History: Last updated: 04/20/2010 ABIs on 11-28-06, Right 1.4, left 1.35  HYPERLIPIDEMIA HYPERTENSION EDEMA  MEDIAL EPICONDYLITIS ELECTROCARDIOGRAM, ABNORMAL OSTEOARTHROSIS NOS, LOWER LEG RHINITIS, ALLERGIC OSTEOPENIA  IRRITABLE BOWEL SYNDROME  GASTROESOPHAGEAL REFLUX, NO ESOPHAGITIS  CATARACTS  OSTEOARTHRITIS   Past Surgical History: Last updated: 08/10/2008 Total Hysterectomy -  Right Total KNee Replacement 11-03-07 Left knee replacement 03-29-08  Family History: Last updated: 08/10/2008 Colon Ca-Father  Parent-DM, hi chol, HTN, afib  mother with alzheimers, afib  Father with MI at age 78 Sister with DM, afib  Social History: Last updated: 06/26/2007 Retired from [employer].  12 yrs education.  Divorced from [employer].  One child.  Quit smoking 1972, no EtOH, no drugs, 2 caff/day, works out in the yard.   Review of Systems       All systems reviewed and negative except as noted in the HPI.  Vital Signs:  Patient profile:   75 year old female Height:      82 inches Weight:  279 pounds Pulse rate:   62 / minute BP sitting:   170 / 80  (left arm) Cuff size:   large  Vitals Entered By: Burnett Kanaris, CNA (July 03, 2010 2:45 PM)  Physical Exam  General:  Affect appropriate Healthy:  appears stated age HEENT: normal Neck supple with no adenopathy JVP normal no bruits no thyromegaly Lungs clear with no wheezing and good diaphragmatic motion Heart:  S1/Split S2 no murmur,rub, gallop or click PMI normal Abdomen: benighn, BS positve, no  tenderness, no AAA no bruit.  No HSM or HJR Distal pulses intact with no bruits No edema Neuro non-focal Skin warm and dry Allens test normal on right hand    EKG  Procedure date:  07/03/2010  Findings:      Normal sinus rhythm with rate of: 65.  Left bundle branch block.    Impression & Recommendations:  Problem # 1:  VENTRICULAR TACHYCARDIA (ICD-427.1) As her only documented episode was during her stress test, I have recommended a periode of watchful waiting.  She could have BBRVT but this has only been documented during her stress test. Her updated medication list for this problem includes:    Aspirin 325 Mg Tabs (Aspirin) .Marland Kitchen... 1 once daily    Bystolic 10 Mg Tabs (Nebivolol hcl) .Marland Kitchen... 1 tab by mouth once daily  Problem # 2:  HYPERTENSION, BENIGN SYSTEMIC (ICD-401.1) Her blood pressure is not well controlled.  With her LBBB and other problems, I am not sure which anti-hypertensive medication would be most appropriate.  I will defer this to Dr. Eden Emms.  I have asked her to maintain a blood pressure log.  Her updated medication list for this problem includes:    Aspirin 325 Mg Tabs (Aspirin) .Marland Kitchen... 1 once daily    Losartan Potassium-hctz 100-25 Mg Tabs (Losartan potassium-hctz) ..... One tablet by mouth daily    Bystolic 10 Mg Tabs (Nebivolol hcl) .Marland Kitchen... 1 tab by mouth once daily    Lasix 20 Mg Tabs (Furosemide) .Marland Kitchen... Take 1 tablet by mouth once a day in the am only as needed for lower extremity swelling  Problem # 3:  HYPERLIPIDEMIA, MILD (ICD-272.4) She will continue a low sodium diet. Her updated medication list for this problem includes:    Pravastatin Sodium 20 Mg Tabs (Pravastatin sodium) .Marland Kitchen... Take 1 tablet by mouth once a day  at bedtime  Problem # 4:  SYNCOPE (ICD-780.2) The etiology of her symptoms is unclear.  I have recommended a period of watchful waiting. Her updated medication list for this problem includes:    Aspirin 325 Mg Tabs (Aspirin) .Marland Kitchen... 1 once daily     Bystolic 10 Mg Tabs (Nebivolol hcl) .Marland Kitchen... 1 tab by mouth once daily  Patient Instructions: 1)  Record Blood Pressure every day . 2)  Call Dr.Taylor if you pass out ( fall out ) 3)  Your physician recommends that you schedule a follow-up appointment in: 4 months with Dr.Nishan

## 2010-11-07 NOTE — Assessment & Plan Note (Signed)
Summary: per check out/saf   Primary Provider:  [name] MD  CC:  sob.  History of Present Illness: 75 yo overweight patient with HTN, elevated lipids and borderline DM.  Recent dizzyness and near syncope while on amlodipine.  Previous preop eval with normal myovue 2008.  Reviewed ECG from then and normal with no conduction defect.  ECG no with LBBB.  Has chronic dyspnea likely due to obesity.  Chronic LE edema with venous insuf on lasix.  Started on bystolic after she didnt' tolerate amlodipine.  Denies SSCP but has significant dyspnea, with abnormal ECG and multiple CRF's.  No recurrent dizzyness while of Calcium blocker.    Her Dobutamine echo was abnormal with septal hypokinesis. More importantly she had possible bundle branch reentry VT.  Although this was ppt by dobutamine it may be clinically important given her LBBB and symptoms of dizzyness.    I discussed cath with her.  Given her size a radial approach would decrease risk of bleeding.  She wants to do this next week as she needs to arrange transportation.  I am not in the lab next week and will schedule it on my noninvasive day.  Dr Excell Seltzer will do the case if I am unavailable.   She understands the indication and risks and is willing to proceed. She will see Dr Ladona Ridgel regardless for further recommendation on her VT which will need to wait for results of cath.  Current Problems (verified): 1)  Syncope  (ICD-780.2) 2)  Dyspnea  (ICD-786.05) 3)  Hyperlipidemia, Mild  (ICD-272.4) 4)  Hypertension, Benign Systemic  (ICD-401.1) 5)  Edema  (ICD-782.3) 6)  Medial Epicondylitis  (ICD-726.31) 7)  Electrocardiogram, Abnormal  (ICD-794.31) 8)  Osteoarthrosis Nos, Lower Leg  (ICD-715.96) 9)  Rhinitis, Allergic  (ICD-477.9) 10)  Osteopenia  (ICD-733.90) 11)  Irritable Bowel Syndrome  (ICD-564.1) 12)  Gastroesophageal Reflux, No Esophagitis  (ICD-530.81) 13)  Cataracts  (ICD-366.9) 14)  Osteoarthritis  (ICD-715.90)  Current  Medications (verified): 1)  Fluticasone Propionate 50 Mcg/act Susp (Fluticasone Propionate) .... Two Sprays Each Nostril Daily 2)  Aspirin 325 Mg Tabs (Aspirin) .Marland Kitchen.. 1 Once Daily 3)  Losartan Potassium-Hctz 100-25 Mg Tabs (Losartan Potassium-Hctz) .... One Tablet By Mouth Daily 4)  Cetirizine Hcl 10 Mg Tabs (Cetirizine Hcl) .... Take 1 Tablet By Mouth Once A Day 5)  Metamucil 30.9 % Powd (Psyllium) .Marland Kitchen.. 1 Rounded Tsp 6)  Multivitamins  Tabs (Multiple Vitamin) .... Take 1 Tablet By Mouth Once A Day 7)  Bystolic 10 Mg Tabs (Nebivolol Hcl) .Marland Kitchen.. 1 Tab By Mouth Once Daily 8)  Calcium-Magnesium-Zinc 333-133-8.3 Mg Tabs (Calcium-Magnesium-Zinc) .... Take 4 Tablets A Day By Mouth 9)  Vitamin D-1000 Max St [phone] Mg-Unit Tabs (Calcium-Vitamin D) .Marland Kitchen.. 1 Tab By Mouth Once Daily 10)  Fish Oil 1200 Mg Caps (Omega-3 Fatty Acids) .... Take 2 Tablets By Mouth in Am and Pm 11)  Cyanocobalamin 1000 Mcg Tabs (Cyanocobalamin) .... Take One Tablet By Mouth Once A Day 12)  Omeprazole 40 Mg Cpdr (Omeprazole) .... Take 1 Tablet By Mouth Once A Day 13)  Pravastatin Sodium 20 Mg Tabs (Pravastatin Sodium) .... Take 1 Tablet By Mouth Once A Day  At Bedtime 14)  Grape Seed Extract 146-60 Mg Tabs (Misc Natural Products) .... Take One Tablet By Mouth Twice A Day 15)  Lasix 20 Mg Tabs (Furosemide) .... Take 1 Tablet By Mouth Once A Day in The Am Only As Needed For Lower Extremity Swelling 16)  Gabapentin 300 Mg Caps (Gabapentin) .Marland KitchenMarland KitchenMarland Kitchen  1  Tab By Mouth Once Daily  Allergies (verified): 1)  ! Exforge (Amlodipine Besylate-Valsartan)  Past History:  Past Medical History: Last updated: 04/20/2010 ABIs on 11-28-06, Right 1.4, left 1.35  HYPERLIPIDEMIA HYPERTENSION EDEMA  MEDIAL EPICONDYLITIS ELECTROCARDIOGRAM, ABNORMAL OSTEOARTHROSIS NOS, LOWER LEG RHINITIS, ALLERGIC OSTEOPENIA  IRRITABLE BOWEL SYNDROME  GASTROESOPHAGEAL REFLUX, NO ESOPHAGITIS  CATARACTS  OSTEOARTHRITIS   Past Surgical History: Last updated:  08/10/2008 Total Hysterectomy -  Right Total KNee Replacement 11-03-07 Left knee replacement 03-29-08  Family History: Last updated: 08/10/2008 Colon Ca-Father  Parent-DM, hi chol, HTN, afib  mother with alzheimers, afib  Father with MI at age 30 Sister with DM, afib  Social History: Last updated: 06/26/2007 Retired from [employer].  12 yrs education.  Divorced from [employer].  One child.  Quit smoking 1972, no EtOH, no drugs, 2 caff/day, works out in the yard.   Review of Systems       Denies fever, malais, weight loss, blurry vision, decreased visual acuity, cough, sputum, SOB, hemoptysis, pleuritic pain, palpitaitons, heartburn, abdominal pain, melena, lower extremity edema, claudication, or rash.   Vital Signs:  Patient profile:   75 year old female Height:      82 inches Weight:      278 pounds BMI:     29.17 Pulse rate:   66 / minute Resp:     16 per minute BP sitting:   138 / 70  (left arm)  Vitals Entered By: Kem Parkinson (May 31, 2010 9:50 AM)  Physical Exam  General:  Affect appropriate Healthy:  appears stated age HEENT: normal Neck supple with no adenopathy JVP normal no bruits no thyromegaly Lungs clear with no wheezing and good diaphragmatic motion Heart:  S1/S2 no murmur,rub, gallop or click PMI normal Abdomen: benighn, BS positve, no tenderness, no AAA no bruit.  No HSM or HJR Distal pulses intact with no bruits No edema Neuro non-focal Skin warm and dry Allens test normal on right hand    Impression & Recommendations:  Problem # 1:  SYNCOPE (ICD-780.2) Re-entrant VT with dobutamine ? septal hyppkinesis with baseling LBBB.  Cath to be arranged then F/U with GT Her updated medication list for this problem includes:    Aspirin 325 Mg Tabs (Aspirin) .Marland Kitchen... 1 once daily    Bystolic 10 Mg Tabs (Nebivolol hcl) .Marland Kitchen... 1 tab by mouth once daily  Problem # 2:  DYSPNEA (ICD-786.05) EF low normal on echo no severe DCM likely related to obesity.   Will see what EDP is at cath and get better idea of EF as echo images were suboptimal Her updated medication list for this problem includes:    Aspirin 325 Mg Tabs (Aspirin) .Marland Kitchen... 1 once daily    Losartan Potassium-hctz 100-25 Mg Tabs (Losartan potassium-hctz) ..... One tablet by mouth daily    Bystolic 10 Mg Tabs (Nebivolol hcl) .Marland Kitchen... 1 tab by mouth once daily    Lasix 20 Mg Tabs (Furosemide) .Marland Kitchen... Take 1 tablet by mouth once a day in the am only as needed for lower extremity swelling  Problem # 3:  HYPERTENSION, BENIGN SYSTEMIC (ICD-401.1) Well controlled Her updated medication list for this problem includes:    Aspirin 325 Mg Tabs (Aspirin) .Marland Kitchen... 1 once daily    Losartan Potassium-hctz 100-25 Mg Tabs (Losartan potassium-hctz) ..... One tablet by mouth daily    Bystolic 10 Mg Tabs (Nebivolol hcl) .Marland Kitchen... 1 tab by mouth once daily    Lasix 20 Mg Tabs (Furosemide) .Marland Kitchen... Take 1 tablet  by mouth once a day in the am only as needed for lower extremity swelling  Problem # 4:  HYPERLIPIDEMIA, MILD (ICD-272.4) Will need tighter control if CAD found Continue statin Her updated medication list for this problem includes:    Pravastatin Sodium 20 Mg Tabs (Pravastatin sodium) .Marland Kitchen... Take 1 tablet by mouth once a day  at bedtime  CHOL: 189 (07/25/2009)   LDL: 111 (07/25/2009)   HDL: 60 (07/25/2009)   TG: 92 (07/25/2009) CHOL (goal): 200 (08/13/2006)   LDL (goal): 130 (08/13/2006)   HDL (goal): 40 (08/13/2006)   TG (goal): 150 (08/13/2006)  Other Orders: TLB-BMP (Basic Metabolic Panel-BMET) (80048-METABOL) TLB-CBC Platelet - w/Differential (85025-CBCD) TLB-PT (Protime) (85610-PTP) TLB-PTT (85730-PTTL) T-2 View CXR (71020TC) Cardiac Catheterization (Cardiac Cath) EP Referral (Cardiology EP Ref )  Patient Instructions: 1)  Your physician recommends that you schedule a follow-up appointment in:  3-4 weeks with dr taylor 2)  Your physician recommends that you return for lab work  ZO:[id number],[id number],XB,JYN 3)  Your physician has requested that you have a cardiac catheterization.  Cardiac catheterization is used to diagnose and/or treat various heart conditions. Doctors may recommend this procedure for a number of different reasons. The most common reason is to evaluate chest pain. Chest pain can be a symptom of coronary artery disease (CAD), and cardiac catheterization can show whether plaque is narrowing or blocking your heart's arteries. This procedure is also used to evaluate the valves, as well as measure the blood flow and oxygen levels in different parts of your heart.  For further information please visit [email or web address]  Please follow instruction sheet, as given.

## 2010-11-07 NOTE — Progress Notes (Signed)
Summary: Neuro referral  Phone Note Call from Patient   Summary of Call: Legs are swelling. Has seen podiatry. Hands swollen as well. Went off of Amlodipine. Swelling is better but not gone. Leg pain, foot pain, toes tingl;ing as well as hands. [facility]   Initial call taken by: Kathlene November,  Feb 06, 2010 3:13 PM  Follow-up for Phone Call        OK will schedule.  Follow-up by: [name] MD,  Feb 06, 2010 3:53 PM

## 2010-11-07 NOTE — Letter (Signed)
Summary: [facility]  [facility]   Imported By: Lanelle Bal 08/16/2010 12:26:33  _____________________________________________________________________  External Attachment:    Type:   Image     Comment:   External Document

## 2010-11-07 NOTE — Assessment & Plan Note (Signed)
Summary: HTN, medial epicondylitis   Vital Signs:  Patient profile:   75 year old female Height:      62 inches Weight:      272 pounds Pulse rate:   60 / minute BP sitting:   168 / 70  (left arm) Cuff size:   large  Vitals Entered By: Kathlene November (October 20, 2009 9:47 AM) CC: follow-up BP   Primary Care Provider:  [name] MD  CC:  follow-up BP.  History of Present Illness: Changed to losartan at last visit. Tolerating well.  No side effects.    Reviewed her labs with her. She wanted a printed copy.   On calcium and vitamin D.  Taking 1600mg  calcium and 2000iu Vitamin D   Still having small BMS.   Had normal colonoscopy about 1 year ago. But overall her IBS is better.   Left elbow pain over the olecranon on and off over this winter.  Right handed. No trauma.  Not sure what caused it.  No exacerbating or alleviating sxs.  Says just has days where it bothers her. Wonders if it is her OA.   Anticoagulation Management History:      Positive risk factors for bleeding include an age of 8 years or older.  Negative risk factors for bleeding include no history of CVA/TIA.  The bleeding index is 'intermediate risk'.  Positive CHADS2 values include History of HTN.  Negative CHADS2 values include Age > 49 years old, History of Diabetes, and Prior Stroke/CVA/TIA.    Current Medications (verified): 1)  Fluticasone Propionate 50 Mcg/act Susp (Fluticasone Propionate) .... Two Sprays Each Nostril Daily 2)  Aspirin 81 Mg Tbec (Aspirin) .... Take 4 Tablet By Mouth Once A Day 3)  Losartan Potassium-Hctz 100-25 Mg Tabs (Losartan Potassium-Hctz) .... Take 1 Tablet By Mouth Once A Day 4)  Cetirizine Hcl 10 Mg Tabs (Cetirizine Hcl) .... Take 1 Tablet By Mouth Once A Day 5)  Metamucil Tablets .... Take 3 Tablets By Mouth Once A Day 6)  Multivitamins  Tabs (Multiple Vitamin) .... Take 1 Tablet By Mouth Once A Day 7)  Corgard 40 Mg  Tabs (Nadolol) .Marland Kitchen.. 1 Tab By Mouth Daily 8)   Calcium-Magnesium-Zinc 333-133-8.3 Mg Tabs (Calcium-Magnesium-Zinc) .... Take 4 Tablets A Day By Mouth 9)  Vitamin D-1000 Max St [phone] Mg-Unit Tabs (Calcium-Vitamin D) .... Take One Tablet By Mouth Once A Day 10)  Fish Oil 1200 Mg Caps (Omega-3 Fatty Acids) .... Take 2 Tablets By Mouth in Am and Pm 11)  Cyanocobalamin 1000 Mcg Tabs (Cyanocobalamin) .... Take One Tablet By Mouth Once A Day 12)  Tylenol Arthritis Pain 650 Mg Tbcr (Acetaminophen) .... Take 6 Tablets By Mouth Once A Day 13)  Omeprazole 40 Mg Cpdr (Omeprazole) .... Take 1 Tablet By Mouth Once A Day 14)  Pravastatin Sodium 20 Mg Tabs (Pravastatin Sodium) .... Take 1 Tablet By Mouth Once A Day  At Bedtime 15)  Zostavax .... Inject Single Dose Im X 1  Allergies (verified): No Known Drug Allergies  Comments:  Nurse/Medical Assistant: The patient's medications and allergies were reviewed with the patient and were updated in the Medication and Allergy Lists. Kathlene November (October 20, 2009 9:47 AM)  Physical Exam  General:  Well-developed,well-nourished,in no acute distress; alert,appropriate and cooperative throughout examination Head:  Normocephalic and atraumatic without obvious abnormalities. No apparent alopecia or balding. Lungs:  Normal respiratory effort, chest expands symmetrically. Lungs are clear to auscultation, no crackles or wheezes. Heart:  Normal rate  and regular rhythm. S1 and S2 normal without gallop, murmur, click, rub or other extra sounds. Msk:  Left elboe is tender over the medial epicondyle.  nontender over the olecranon.  Pain wiht internal totation against resistance. No pain with supination or pronation.  No swelling or redness. Elbow and wrist with NROM and strength 5/5  Skin:  no rashes.   Psych:  Cognition and judgment appear intact. Alert and cooperative with normal attention span and concentration. No apparent delusions, illusions, hallucinations   Impression & Recommendations:  Problem # 1:   HYPERTENSION, BENIGN SYSTEMIC (ICD-401.1) Not at goal. Will add amlopdipine. Will monitor for decreased HR on thismedication. Call if any SE.  Fu in 3-4 weeks to recheck BP. Really need to get her to goal. She is toelrating the losartan really well.  Gave her a copy of her labs from October. LDL not quite at goal but I think she can inmprove this with diet and exercise.  Her updated medication list for this problem includes:    Losartan Potassium-hctz 100-25 Mg Tabs (Losartan potassium-hctz) .Marland Kitchen... Take 1 tablet by mouth once a day    Corgard 40 Mg Tabs (Nadolol) .Marland Kitchen... 1 tab by mouth daily    Amlodipine Besylate 5 Mg Tabs (Amlodipine besylate) .Marland Kitchen... Take 1 tablet by mouth once a day  BP today: 168/70 Prior BP: 162/76 (07/25/2009)  Prior 10 Yr Risk Heart Disease: 9 % (01/08/2008)  Labs Reviewed: K+: 4.1 (07/25/2009) Creat: : 0.72 (07/25/2009)   Chol: 189 (07/25/2009)   HDL: 60 (07/25/2009)   LDL: 111 (07/25/2009)   TG: 92 (07/25/2009)  Problem # 2:  MEDIAL EPICONDYLITIS (ICD-726.31) Assessment: New Discussed dx adn treatment. Can use occ NSAID but not frequently. Also can ice the area for pain relief. GAve H.O. on exercises to do for the next 3 weeks. If not gettting better then let me know and will treat further.  .   Complete Medication List: 1)  Fluticasone Propionate 50 Mcg/act Susp (Fluticasone propionate) .... Two sprays each nostril daily 2)  Aspirin 81 Mg Tbec (Aspirin) .... Take 4 tablet by mouth once a day 3)  Losartan Potassium-hctz 100-25 Mg Tabs (Losartan potassium-hctz) .... Take 1 tablet by mouth once a day 4)  Cetirizine Hcl 10 Mg Tabs (Cetirizine hcl) .... Take 1 tablet by mouth once a day 5)  Metamucil Tablets  .... Take 3 tablets by mouth once a day 6)  Multivitamins Tabs (Multiple vitamin) .... Take 1 tablet by mouth once a day 7)  Corgard 40 Mg Tabs (Nadolol) .Marland Kitchen.. 1 tab by mouth daily 8)  Calcium-magnesium-zinc 333-133-8.3 Mg Tabs (Calcium-magnesium-zinc) .... Take 4  tablets a day by mouth 9)  Vitamin D-1000 Max St [phone] Mg-unit Tabs (Calcium-vitamin d) .... Take one tablet by mouth once a day 10)  Fish Oil 1200 Mg Caps (omega-3 Fatty Acids)  .... Take 2 tablets by mouth in am and pm 11)  Cyanocobalamin 1000 Mcg Tabs (Cyanocobalamin) .... Take one tablet by mouth once a day 12)  Tylenol Arthritis Pain 650 Mg Tbcr (Acetaminophen) .... Take 6 tablets by mouth once a day 13)  Omeprazole 40 Mg Cpdr (Omeprazole) .... Take 1 tablet by mouth once a day 14)  Pravastatin Sodium 20 Mg Tabs (Pravastatin sodium) .... Take 1 tablet by mouth once a day  at bedtime 15)  Zostavax  .... Inject single dose im x 1 16)  Amlodipine Besylate 5 Mg Tabs (Amlodipine besylate) .... Take 1 tablet by mouth once a day  Prescriptions: LOSARTAN POTASSIUM-HCTZ 100-25 MG TABS (LOSARTAN POTASSIUM-HCTZ) Take 1 tablet by mouth once a day  #90 x 3   Entered and Authorized by:   [name] MD   Signed by:   [name] MD on 10/20/2009   Method used:   Print then Give to Patient   RxID:   [id number] CORGARD 40 MG  TABS (NADOLOL) 1 tab by mouth daily  #90 x 3   Entered and Authorized by:   [name] MD   Signed by:   [name] MD on 10/20/2009   Method used:   Print then Give to Patient   RxID:   [id number] AMLODIPINE BESYLATE 5 MG TABS (AMLODIPINE BESYLATE) Take 1 tablet by mouth once a day  #30 x 1   Entered and Authorized by:   [name] MD   Signed by:   [name] MD on 10/20/2009   Method used:   Electronically to        [facility] [phone]* (retail)       [address]       [place], [place]  [address]       Ph: [phone]       Fax: [phone]   RxID:   [id number]

## 2010-11-07 NOTE — Letter (Signed)
Summary: [facility]  [facility]   Imported By: Lanelle Bal 06/02/2010 12:42:13  _____________________________________________________________________  External Attachment:    Type:   Image     Comment:   External Document

## 2010-11-07 NOTE — Assessment & Plan Note (Signed)
Summary: 1 MONTH FU HTN, swelling   Vital Signs:  Patient profile:   75 year old female Height:      62 inches Weight:      278 pounds Pulse rate:   59 / minute BP sitting:   151 / 74  (left arm) Cuff size:   large  Vitals Entered By: Kathlene November (November 24, 2009 10:12 AM) CC: followup BP   Primary Care Provider:  [name] MD  CC:  followup BP.  History of Present Illness: Feels like retaining fluid. Has noticed some wheezing. No SOB for today.   Current Medications (verified): 1)  Fluticasone Propionate 50 Mcg/act Susp (Fluticasone Propionate) .... Two Sprays Each Nostril Daily 2)  Aspirin 81 Mg Tbec (Aspirin) .... Take 4 Tablet By Mouth Once A Day 3)  Losartan Potassium-Hctz 100-25 Mg Tabs (Losartan Potassium-Hctz) .... Take 1 Tablet By Mouth Once A Day 4)  Cetirizine Hcl 10 Mg Tabs (Cetirizine Hcl) .... Take 1 Tablet By Mouth Once A Day 5)  Metamucil Tablets .... Take 3 Tablets By Mouth Once A Day 6)  Multivitamins  Tabs (Multiple Vitamin) .... Take 1 Tablet By Mouth Once A Day 7)  Corgard 40 Mg  Tabs (Nadolol) .Marland Kitchen.. 1 Tab By Mouth Daily 8)  Calcium-Magnesium-Zinc 333-133-8.3 Mg Tabs (Calcium-Magnesium-Zinc) .... Take 4 Tablets A Day By Mouth 9)  Vitamin D-1000 Max St [phone] Mg-Unit Tabs (Calcium-Vitamin D) .... Take One Tablet By Mouth Once A Day 10)  Fish Oil 1200 Mg Caps (Omega-3 Fatty Acids) .... Take 2 Tablets By Mouth in Am and Pm 11)  Cyanocobalamin 1000 Mcg Tabs (Cyanocobalamin) .... Take One Tablet By Mouth Once A Day 12)  Omeprazole 40 Mg Cpdr (Omeprazole) .... Take 1 Tablet By Mouth Once A Day 13)  Pravastatin Sodium 20 Mg Tabs (Pravastatin Sodium) .... Take 1 Tablet By Mouth Once A Day  At Bedtime 14)  Zostavax .... Inject Single Dose Im X 1 15)  Amlodipine Besylate 5 Mg Tabs (Amlodipine Besylate) .... Take 1 Tablet By Mouth Once A Day 16)  Grape Seed Extract 146-60 Mg Tabs (Misc Natural Products) .... Take One Tablet By Mouth Twice A Day 17)   Glucosamine Sulfate 750 Mg Caps (Glucosamine Sulfate) .... Take One Tablet By Mouth Once A Day  Allergies (verified): No Known Drug Allergies  Comments:  Nurse/Medical Assistant: The patient's medications and allergies were reviewed with the patient and were updated in the Medication and Allergy Lists. Kathlene November (November 24, 2009 10:19 AM)  Physical Exam  General:  Well-developed,well-nourished,in no acute distress; alert,appropriate and cooperative throughout examination Head:  Normocephalic and atraumatic without obvious abnormalities. No apparent alopecia or balding. Lungs:  Normal respiratory effort, chest expands symmetrically. Lungs are clear to auscultation, no crackles or wheezes. Heart:  Normal rate and regular rhythm. S1 and S2 normal without gallop, murmur, click, rub or other extra sounds. Extremities:  Trace edema in both ankles to mid lower leg.  Skin:  no rashes.   Psych:  Cognition and judgment appear intact. Alert and cooperative with normal attention span and concentration. No apparent delusions, illusions, hallucinations   Impression & Recommendations:  Problem # 1:  HYPERTENSION, BENIGN SYSTEMIC (ICD-401.1) Assessment Deteriorated  Will stop HCTZ.  Will change to Lasix since has gained 6 lbs and has signs of fluid retention. . check your potasssium in one week. Fu in 2-3 weeks to recheck blood pressure.  Also decrease Aleve as can cause swelling. Maybe from the amlodipine thought it is  low dose and she did have some LE swelling prior to starting the medication.  Her updated medication list for this problem includes:    Losartan Potassium 100 Mg Tabs (Losartan potassium) .Marland Kitchen... Take 1 tablet by mouth once a day    Corgard 40 Mg Tabs (Nadolol) .Marland Kitchen... 1 tab by mouth daily    Amlodipine Besylate 5 Mg Tabs (Amlodipine besylate) .Marland Kitchen... Take 1 tablet by mouth once a day    Lasix 20 Mg Tabs (Furosemide) .Marland Kitchen... Take 1 tablet by mouth once a day in the am.  Orders: T-Basic  Metabolic Panel [id number])  Complete Medication List: 1)  Fluticasone Propionate 50 Mcg/act Susp (Fluticasone propionate) .... Two sprays each nostril daily 2)  Aspirin 81 Mg Tbec (Aspirin) .... Take 4 tablet by mouth once a day 3)  Losartan Potassium 100 Mg Tabs (Losartan potassium) .... Take 1 tablet by mouth once a day 4)  Cetirizine Hcl 10 Mg Tabs (Cetirizine hcl) .... Take 1 tablet by mouth once a day 5)  Metamucil Tablets  .... Take 3 tablets by mouth once a day 6)  Multivitamins Tabs (Multiple vitamin) .... Take 1 tablet by mouth once a day 7)  Corgard 40 Mg Tabs (Nadolol) .Marland Kitchen.. 1 tab by mouth daily 8)  Calcium-magnesium-zinc 333-133-8.3 Mg Tabs (Calcium-magnesium-zinc) .... Take 4 tablets a day by mouth 9)  Vitamin D-1000 Max St [phone] Mg-unit Tabs (Calcium-vitamin d) .... Take one tablet by mouth once a day 10)  Fish Oil 1200 Mg Caps (omega-3 Fatty Acids)  .... Take 2 tablets by mouth in am and pm 11)  Cyanocobalamin 1000 Mcg Tabs (Cyanocobalamin) .... Take one tablet by mouth once a day 12)  Omeprazole 40 Mg Cpdr (Omeprazole) .... Take 1 tablet by mouth once a day 13)  Pravastatin Sodium 20 Mg Tabs (Pravastatin sodium) .... Take 1 tablet by mouth once a day  at bedtime 14)  Zostavax  .... Inject single dose im x 1 15)  Amlodipine Besylate 5 Mg Tabs (Amlodipine besylate) .... Take 1 tablet by mouth once a day 16)  Grape Seed Extract 146-60 Mg Tabs (Misc natural products) .... Take one tablet by mouth twice a day 17)  Glucosamine Sulfate 750 Mg Caps (Glucosamine sulfate) .... Take one tablet by mouth once a day 18)  Lasix 20 Mg Tabs (Furosemide) .... Take 1 tablet by mouth once a day in the am.   Patient Instructions: 1)  Please schedule a follow-up appointment in 2-3 weeks.  2)  Check labs in one week.  Prescriptions: LASIX 20 MG TABS (FUROSEMIDE) Take 1 tablet by mouth once a day in the AM.  #30 x 1   Entered and Authorized by:   [name] MD   Signed by:    [name] MD on 11/24/2009   Method used:   Electronically to        [facility] [phone]* (retail)       [address]       [place], [place]  [address]       Ph: [phone]       Fax: [phone]   RxID:   [id number] DGUYQIHK POTASSIUM 100 MG TABS (LOSARTAN POTASSIUM) Take 1 tablet by mouth once a day  #30 x 1   Entered and Authorized by:   [name] MD   Signed by:   [name] MD on 11/24/2009   Method used:   Electronically to        [facility] #  [id number]* (retail)       [address] [place], [place]  [address]       Ph: [phone]       Fax: [phone]   RxID:   [id number]

## 2010-11-07 NOTE — Assessment & Plan Note (Signed)
Summary: HTN   Vital Signs:  Patient profile:   75 year old female Height:      62 inches Weight:      277 pounds BMI:     50.85 O2 Sat:      98 % on Room air Pulse rate:   63 / minute BP sitting:   145 / 76  (left arm) Cuff size:   large  Vitals Entered By: Payton Spark CMA (December 19, 2009 11:03 AM)  O2 Flow:  Room air CC: F/U HTN   Primary Care Provider:  [name] MD  CC:  F/U HTN.  History of Present Illness: Switched to lasix and now her ankles are less swollen. No leg cramps.  Stil has sore feet.  Did buy soem new shoes which do help but still hs to change them.   Current Medications (verified): 1)  Fluticasone Propionate 50 Mcg/act Susp (Fluticasone Propionate) .... Two Sprays Each Nostril Daily 2)  Aspirin 81 Mg Tbec (Aspirin) .... Take 4 Tablet By Mouth Once A Day 3)  Losartan Potassium 100 Mg Tabs (Losartan Potassium) .... Take 1 Tablet By Mouth Once A Day 4)  Cetirizine Hcl 10 Mg Tabs (Cetirizine Hcl) .... Take 1 Tablet By Mouth Once A Day 5)  Metamucil Tablets .... Take 3 Tablets By Mouth Once A Day 6)  Multivitamins  Tabs (Multiple Vitamin) .... Take 1 Tablet By Mouth Once A Day 7)  Corgard 40 Mg  Tabs (Nadolol) .Marland Kitchen.. 1 Tab By Mouth Daily 8)  Calcium-Magnesium-Zinc 333-133-8.3 Mg Tabs (Calcium-Magnesium-Zinc) .... Take 4 Tablets A Day By Mouth 9)  Vitamin D-1000 Max St [phone] Mg-Unit Tabs (Calcium-Vitamin D) .... Take One Tablet By Mouth Once A Day 10)  Fish Oil 1200 Mg Caps (Omega-3 Fatty Acids) .... Take 2 Tablets By Mouth in Am and Pm 11)  Cyanocobalamin 1000 Mcg Tabs (Cyanocobalamin) .... Take One Tablet By Mouth Once A Day 12)  Omeprazole 40 Mg Cpdr (Omeprazole) .... Take 1 Tablet By Mouth Once A Day 13)  Pravastatin Sodium 20 Mg Tabs (Pravastatin Sodium) .... Take 1 Tablet By Mouth Once A Day  At Bedtime 14)  Amlodipine Besylate 5 Mg Tabs (Amlodipine Besylate) .... Take 1 Tablet By Mouth Once A Day 15)  Grape Seed Extract 146-60 Mg Tabs (Misc  Natural Products) .... Take One Tablet By Mouth Twice A Day 16)  Glucosamine Sulfate 750 Mg Caps (Glucosamine Sulfate) .... Take One Tablet By Mouth Once A Day 17)  Lasix 20 Mg Tabs (Furosemide) .... Take 1 Tablet By Mouth Once A Day in The Am.  Allergies (verified): No Known Drug Allergies  Physical Exam  General:  Well-developed,well-nourished,in no acute distress; alert,appropriate and cooperative throughout examination Head:  Normocephalic and atraumatic without obvious abnormalities. No apparent alopecia or balding. Lungs:  Normal respiratory effort, chest expands symmetrically. Lungs are clear to auscultation, no crackles or wheezes. Heart:  Normal rate and regular rhythm. S1 and S2 normal without gallop, murmur, click, rub or other extra sounds. Extremities:  TRace ankle edema bilat.  Skin:  no rashes.   Psych:  Cognition and judgment appear intact. Alert and cooperative with normal attention span and concentration. No apparent delusions, illusions, hallucinations   Impression & Recommendations:  Problem # 1:  HYPERTENSION, BENIGN SYSTEMIC (ICD-401.1)  Contiunue really watch your salt intake.  Recheck potassium in 2 weeks. BP improved today.  F/.U in 3 monhts. If starts sweling again then please let me now.  Her updated medication list for this  problem includes:    Losartan Potassium 100 Mg Tabs (Losartan potassium) .Marland Kitchen... Take 1 tablet by mouth once a day    Corgard 40 Mg Tabs (Nadolol) .Marland Kitchen... 1 tab by mouth daily    Amlodipine Besylate 5 Mg Tabs (Amlodipine besylate) .Marland Kitchen... Take 1 tablet by mouth once a day    Lasix 20 Mg Tabs (Furosemide) .Marland Kitchen... Take 1 tablet by mouth once a day in the am.  Orders: T-Basic Metabolic Panel [id number])  BP today: 145/76 Prior BP: 151/74 (11/24/2009)  Prior 10 Yr Risk Heart Disease: 9 % (01/08/2008)  Labs Reviewed: K+: 4.3 (12/06/2009) Creat: : 0.69 (12/06/2009)   Chol: 189 (07/25/2009)   HDL: 60 (07/25/2009)   LDL: 111 (07/25/2009)   TG:  92 (07/25/2009)  Complete Medication List: 1)  Fluticasone Propionate 50 Mcg/act Susp (Fluticasone propionate) .... Two sprays each nostril daily 2)  Aspirin 81 Mg Tbec (Aspirin) .... Take 4 tablet by mouth once a day 3)  Losartan Potassium 100 Mg Tabs (Losartan potassium) .... Take 1 tablet by mouth once a day 4)  Cetirizine Hcl 10 Mg Tabs (Cetirizine hcl) .... Take 1 tablet by mouth once a day 5)  Metamucil Tablets  .... Take 3 tablets by mouth once a day 6)  Multivitamins Tabs (Multiple vitamin) .... Take 1 tablet by mouth once a day 7)  Corgard 40 Mg Tabs (Nadolol) .Marland Kitchen.. 1 tab by mouth daily 8)  Calcium-magnesium-zinc 333-133-8.3 Mg Tabs (Calcium-magnesium-zinc) .... Take 4 tablets a day by mouth 9)  Vitamin D-1000 Max St [phone] Mg-unit Tabs (Calcium-vitamin d) .... Take one tablet by mouth once a day 10)  Fish Oil 1200 Mg Caps (omega-3 Fatty Acids)  .... Take 2 tablets by mouth in am and pm 11)  Cyanocobalamin 1000 Mcg Tabs (Cyanocobalamin) .... Take one tablet by mouth once a day 12)  Omeprazole 40 Mg Cpdr (Omeprazole) .... Take 1 tablet by mouth once a day 13)  Pravastatin Sodium 20 Mg Tabs (Pravastatin sodium) .... Take 1 tablet by mouth once a day  at bedtime 14)  Amlodipine Besylate 5 Mg Tabs (Amlodipine besylate) .... Take 1 tablet by mouth once a day 15)  Grape Seed Extract 146-60 Mg Tabs (Misc natural products) .... Take one tablet by mouth twice a day 16)  Glucosamine Sulfate 750 Mg Caps (Glucosamine sulfate) .... Take one tablet by mouth once a day 17)  Lasix 20 Mg Tabs (Furosemide) .... Take 1 tablet by mouth once a day in the am.  Patient Instructions: 1)  Please schedule a follow-up appointment in 3 months .  Prescriptions: LASIX 20 MG TABS (FUROSEMIDE) Take 1 tablet by mouth once a day in the AM.  #90 x 1   Entered and Authorized by:   [name] MD   Signed by:   [name] MD on 12/19/2009   Method used:   Printed then faxed to ...       [facility]  (retail)             , [place]         Ph:        Fax: [phone]   RxID:   [id number] AMLODIPINE BESYLATE 5 MG TABS (AMLODIPINE BESYLATE) Take 1 tablet by mouth once a day  #90 x 1   Entered and Authorized by:   [name] MD   Signed by:   [name] MD on 12/19/2009   Method used:   Printed then faxed to .[facility]  [facility] (retail)             , [place]         Ph:        Fax: [phone]   RxID:   [id number] NUUVOZDG POTASSIUM 100 MG TABS (LOSARTAN POTASSIUM) Take 1 tablet by mouth once a day  #90 x 1   Entered and Authorized by:   [name] MD   Signed by:   [name] MD on 12/19/2009   Method used:   Printed then faxed to ...       [facility] (retail)             , [place]         Ph:        Fax: [phone]   RxID:   [id number]   Last Flu Vaccine:  Fluvax 3+ (07/25/2009 10:13:18 AM) Flu Vaccine Result Date:  07/08/2009 Flu Vaccine Result:  given Flu Vaccine Next Due:  1 yr

## 2010-11-07 NOTE — Miscellaneous (Signed)
Summary: IV for Dobutamine Echo  Clinical Lists Changes     IV 24 G (R) Hand for Dobutamine Echo. Patsy Edwards,RN.

## 2010-11-09 NOTE — Assessment & Plan Note (Signed)
Summary: per check out/sf   Primary Provider:  [name] MD  CC:  follow up.  History of Present Illness: [name] is referred today for Dobutamine induced NSVT and ? syncope in the past in the setting of LBBB.  The patient does not think she actually passed out.  She was well until several months ago when she experienced an episode where she fell to the ground.  She does not think she lost conscousness.  She has never had another episode.  She denies c/p.  She underwent a Dobutamine stress test where she had NSVT. Subsequent cahteterization demonstrated no obstructive CAD and preserved LV function.  She has had poorly controlled HTN but as her episode occured while on amlodipine in the setting of her LBBB, it was stopped.  I reviewed her BP and weight records for the last 4 months.  She is not well controlled with average systolics still 150-160.  Weight is still hight around 274.  Long discussion about dietary indiscretion, low carbs and exercise.  She use to do silver sneakers but poor balance limits her activity.  Will put her on stronger diuretic and add different calcium blocker to see if we can improve her BP and edema.  So long as she is obese, with varicosities and sednentary with salt indiscretion her edema will be hard to Rx  Current Problems (verified): 1)  Ventricular Tachycardia  (ICD-427.1) 2)  Syncope  (ICD-780.2) 3)  Dyspnea  (ICD-786.05) 4)  Hyperlipidemia, Mild  (ICD-272.4) 5)  Hypertension, Benign Systemic  (ICD-401.1) 6)  Edema  (ICD-782.3) 7)  Medial Epicondylitis  (ICD-726.31) 8)  Electrocardiogram, Abnormal  (ICD-794.31) 9)  Osteoarthrosis Nos, Lower Leg  (ICD-715.96) 10)  Rhinitis, Allergic  (ICD-477.9) 11)  Osteopenia  (ICD-733.90) 12)  Irritable Bowel Syndrome  (ICD-564.1) 13)  Gastroesophageal Reflux, No Esophagitis  (ICD-530.81) 14)  Cataracts  (ICD-366.9) 15)  Osteoarthritis  (ICD-715.90)  Current Medications (verified): 1)  Fluticasone Propionate  50 Mcg/act Susp (Fluticasone Propionate) .... Two Sprays Each Nostril Daily 2)  Aspirin 325 Mg Tabs (Aspirin) .Marland Kitchen.. 1 Once Daily 3)  Losartan Potassium 100 Mg Tabs (Losartan Potassium) .... One Tablet By Mouth Once Daily 4)  Cetirizine Hcl 10 Mg Tabs (Cetirizine Hcl) .... Take 1 Tablet By Mouth Once A Day 5)  Metamucil 30.9 % Powd (Psyllium) .Marland Kitchen.. 1 Rounded Tsp 6)  Multivitamins  Tabs (Multiple Vitamin) .... Take 1 Tablet By Mouth Once A Day 7)  Bystolic 10 Mg Tabs (Nebivolol Hcl) .Marland Kitchen.. 1 Tab By Mouth Once Daily 8)  Calcium-Magnesium-Zinc 333-133-8.3 Mg Tabs (Calcium-Magnesium-Zinc) .... Take 4 Tablets A Day By Mouth 9)  Vitamin D-1000 Max St [phone] Mg-Unit Tabs (Calcium-Vitamin D) .Marland Kitchen.. 1 Tab By Mouth Once Daily 10)  Fish Oil 1200 Mg Caps (Omega-3 Fatty Acids) .... Take 2 Tablets By Mouth in Am and Pm 11)  Cyanocobalamin 1000 Mcg Tabs (Cyanocobalamin) .... Take One Tablet By Mouth Once A Day 12)  Omeprazole 40 Mg Cpdr (Omeprazole) .... Take 1 Tablet By Mouth Once A Day 13)  Pravastatin Sodium 20 Mg Tabs (Pravastatin Sodium) .... Take 1 Tablet By Mouth Once A Day  At Bedtime 14)  Grape Seed Extract 146-60 Mg Tabs (Misc Natural Products) .... Take One Tablet By Mouth Twice A Day 15)  Gabapentin 300 Mg Caps (Gabapentin) .Marland Kitchen.. 1  Tab By Mouth Once Daily 16)  Amoxicillin .... As Needed Dental 17)  Furosemide 20 Mg Tabs (Furosemide) .... Take One Tablet By Mouth Two Times A Day 18)  Klor-Con M10 10 Meq Cr-Tabs (Potassium Chloride Crys Cr) .... One Tablet By Mouth Once Daily 19)  Calan Sr 120 Mg Cr-Tabs (Verapamil Hcl) .... Onwe Tablet By Mouth Once Daily  Allergies (verified): 1)  ! Exforge (Amlodipine Besylate-Valsartan)  Past History:  Past Medical History: Last updated: 04/20/2010 ABIs on 11-28-06, Right 1.4, left 1.35  HYPERLIPIDEMIA HYPERTENSION EDEMA  MEDIAL EPICONDYLITIS ELECTROCARDIOGRAM, ABNORMAL OSTEOARTHROSIS NOS, LOWER LEG RHINITIS, ALLERGIC OSTEOPENIA  IRRITABLE BOWEL  SYNDROME  GASTROESOPHAGEAL REFLUX, NO ESOPHAGITIS  CATARACTS  OSTEOARTHRITIS   Past Surgical History: Last updated: 08/10/2008 Total Hysterectomy -  Right Total KNee Replacement 11-03-07 Left knee replacement 03-29-08  Family History: Last updated: 08/10/2008 Colon Ca-Father  Parent-DM, hi chol, HTN, afib  mother with alzheimers, afib  Father with MI at age 31 Sister with DM, afib  Social History: Last updated: 06/26/2007 Retired from [employer].  12 yrs education.  Divorced from [employer].  One child.  Quit smoking 1972, no EtOH, no drugs, 2 caff/day, works out in the yard.   Review of Systems       Denies fever, malais, weight loss, blurry vision, decreased visual acuity, cough, sputum, SOB, hemoptysis, pleuritic pain, palpitaitons, heartburn, abdominal pain, melena, l, claudication, or rash.   Vital Signs:  Patient profile:   75 year old female Height:      82 inches Weight:      279 pounds BMI:     29.28 Pulse rate:   58 / minute Resp:     16 per minute BP sitting:   161 / 78  (left arm)  Vitals Entered By: Kem Parkinson (October 26, 2010 2:08 PM)  Physical Exam  General:  Affect appropriate Healthy:  appears stated age HEENT: normal Neck supple with no adenopathy JVP normal no bruits no thyromegaly Lungs clear with no wheezing and good diaphragmatic motion Heart:  S1/S2 no murmur,rub, gallop or click PMI normal Abdomen: benighn, BS positve, no tenderness, no AAA no bruit.  No HSM or HJR Distal pulses intact with no bruits Plus one bilateral  edema Neuro non-focal Skin warm and dry    Impression & Recommendations:  Problem # 1:  VENTRICULAR TACHYCARDIA (ICD-427.1) Normal cors and normal LV  No need for further Rx or w/u per EPS Her updated medication list for this problem includes:    Aspirin 325 Mg Tabs (Aspirin) .Marland Kitchen... 1 once daily    Bystolic 10 Mg Tabs (Nebivolol hcl) .Marland Kitchen... 1 tab by mouth once daily    Calan Sr 120 Mg Cr-tabs (Verapamil hcl)  ..... Onwe tablet by mouth once daily  Problem # 2:  SYNCOPE (ICD-780.2) Non recurrent ? vasovagal Her updated medication list for this problem includes:    Aspirin 325 Mg Tabs (Aspirin) .Marland Kitchen... 1 once daily    Bystolic 10 Mg Tabs (Nebivolol hcl) .Marland Kitchen... 1 tab by mouth once daily    Calan Sr 120 Mg Cr-tabs (Verapamil hcl) ..... Onwe tablet by mouth once daily  Problem # 3:  DYSPNEA (ICD-786.05) Related to obesity  no LV dysfunction   The following medications were removed from the medication list:    Lasix 20 Mg Tabs (Furosemide) .Marland Kitchen... Take 1 tablet by mouth once a day in the am only as needed for lower extremity swelling Her updated medication list for this problem includes:    Aspirin 325 Mg Tabs (Aspirin) .Marland Kitchen... 1 once daily    Losartan Potassium 100 Mg Tabs (Losartan potassium) ..... One tablet by mouth once daily    Bystolic 10  Mg Tabs (Nebivolol hcl) .Marland Kitchen... 1 tab by mouth once daily    Furosemide 20 Mg Tabs (Furosemide) .Marland Kitchen... Take one tablet by mouth two times a day    Calan Sr 120 Mg Cr-tabs (Verapamil hcl) ..... Onwe tablet by mouth once daily  Orders: T-Basic Metabolic Panel ([phone])  Problem # 4:  HYPERLIPIDEMIA, MILD (ICD-272.4)  Continue statin  Target LDL under 100 Her updated medication list for this problem includes:    Pravastatin Sodium 20 Mg Tabs (Pravastatin sodium) .Marland Kitchen... Take 1 tablet by mouth once a day  at bedtime  Her updated medication list for this problem includes:    Pravastatin Sodium 20 Mg Tabs (Pravastatin sodium) .Marland Kitchen... Take 1 tablet by mouth once a day  at bedtime  Problem # 5:  HYPERTENSION, BENIGN SYSTEMIC (ICD-401.1) Add Lasix and verapamil.  F/U 8 weeks BMET in 4 weeks The following medications were removed from the medication list:    Lasix 20 Mg Tabs (Furosemide) .Marland Kitchen... Take 1 tablet by mouth once a day in the am only as needed for lower extremity swelling Her updated medication list for this problem includes:    Aspirin 325 Mg Tabs (Aspirin)  .Marland Kitchen... 1 once daily    Losartan Potassium 100 Mg Tabs (Losartan potassium) ..... One tablet by mouth once daily    Bystolic 10 Mg Tabs (Nebivolol hcl) .Marland Kitchen... 1 tab by mouth once daily    Furosemide 20 Mg Tabs (Furosemide) .Marland Kitchen... Take one tablet by mouth two times a day    Calan Sr 120 Mg Cr-tabs (Verapamil hcl) ..... Onwe tablet by mouth once daily  Orders: T-Basic Metabolic Panel ([phone])  The following medications were removed from the medication list:    Lasix 20 Mg Tabs (Furosemide) .Marland Kitchen... Take 1 tablet by mouth once a day in the am only as needed for lower extremity swelling Her updated medication list for this problem includes:    Aspirin 325 Mg Tabs (Aspirin) .Marland Kitchen... 1 once daily    Losartan Potassium 100 Mg Tabs (Losartan potassium) ..... One tablet by mouth once daily    Bystolic 10 Mg Tabs (Nebivolol hcl) .Marland Kitchen... 1 tab by mouth once daily    Furosemide 20 Mg Tabs (Furosemide) .Marland Kitchen... Take one tablet by mouth two times a day    Calan Sr 120 Mg Cr-tabs (Verapamil hcl) ..... Onwe tablet by mouth once daily  Problem # 6:  EDEMA (ICD-782.3) See HPI.  Lasix 20 bid  Patient Instructions: 1)  Your physician recommends that you schedule a follow-up appointment in: 8-10 WEEKS 2)  Your physician recommends that you HAVE BLOOD WORK IN [place] IN 4 WEEKS 3)  Your physician has recommended you make the following change in your medication: STOP LOSARTAN/HCT 4)  START LOSARTAN 100MG  ONE DAILY 5)  START FUROSEMIDE 20MG  ONE TABLET TWICE DAILY 6)  START POTASSIUM ONE TABLET ONCE DAILY 7)  START CALAN CR 120MG  ONCE DAILY Prescriptions: CALAN SR 120 MG CR-TABS (VERAPAMIL HCL) ONWE TABLET by mouth once daily  #30 x 12   Entered by:   Deliah Goody, RN   Authorized by:   Colon Branch, MD, [facility]   Signed by:   Deliah Goody, RN on 10/26/2010   Method used:   Electronically to        [facility] [phone]* (retail)       [address]       [place], [place]  [address]       Ph:  [phone]  Fax: [phone]   RxID:   [id number] KLOR-CON M10 10 MEQ CR-TABS (POTASSIUM CHLORIDE CRYS CR) ONE TABLET by mouth once daily  #30 x 12   Entered by:   Deliah Goody, RN   Authorized by:   Colon Branch, MD, [facility]   Signed by:   Deliah Goody, RN on 10/26/2010   Method used:   Electronically to        [facility] [phone]* (retail)       [address]       [place], [place]  [address]       Ph: [phone]       Fax: [phone]   RxID:   [id number] FUROSEMIDE 20 MG TABS (FUROSEMIDE) Take one tablet by mouth two times a day  #60 x 12   Entered by:   Deliah Goody, RN   Authorized by:   Colon Branch, MD, [facility]   Signed by:   Deliah Goody, RN on 10/26/2010   Method used:   Electronically to        [facility] [phone]* (retail)       [address]       [place], [place]  [address]       Ph: [phone]       Fax: [phone]   RxID:   [id number] LOSARTAN POTASSIUM 100 MG TABS (LOSARTAN POTASSIUM) ONE TABLET by mouth once daily  #30 x 12   Entered by:   Deliah Goody, RN   Authorized by:   Colon Branch, MD, [facility]   Signed by:   Deliah Goody, RN on 10/26/2010   Method used:   Electronically to        [facility] [phone]* (retail)       [address]       [place], [place]  [address]       Ph: [phone]       Fax: [phone]   RxID:   [id number]

## 2010-11-09 NOTE — Assessment & Plan Note (Signed)
Summary: CPE   Vital Signs:  Patient profile:   75 year old female Height:      82 inches Weight:      276 pounds Pulse rate:   77 / minute BP sitting:   147 / 76  (right arm) Cuff size:   large  Vitals Entered By: Avon Gully CMA, Duncan Dull) (October 31, 2010 1:43 PM) CC: CPE   Primary Care Provider:  [name] MD  CC:  CPE.  History of Present Illness: Cardiologist changed her meds last week.  Keeping record of BP and weight.  Has been trying to lose some weight with her diet.   Current Medications (verified): 1)  Fluticasone Propionate 50 Mcg/act Susp (Fluticasone Propionate) .... Two Sprays Each Nostril Daily 2)  Aspirin 325 Mg Tabs (Aspirin) .Marland Kitchen.. 1 Once Daily 3)  Losartan Potassium 100 Mg Tabs (Losartan Potassium) .... One Tablet By Mouth Once Daily 4)  Cetirizine Hcl 10 Mg Tabs (Cetirizine Hcl) .... Take 1 Tablet By Mouth Once A Day 5)  Metamucil 30.9 % Powd (Psyllium) .Marland Kitchen.. 1 Rounded Tsp 6)  Multivitamins  Tabs (Multiple Vitamin) .... Take 1 Tablet By Mouth Once A Day 7)  Bystolic 10 Mg Tabs (Nebivolol Hcl) .Marland Kitchen.. 1 Tab By Mouth Once Daily 8)  Calcium-Magnesium-Zinc 333-133-8.3 Mg Tabs (Calcium-Magnesium-Zinc) .... Take 4 Tablets A Day By Mouth 9)  Vitamin D-1000 Max St [phone] Mg-Unit Tabs (Calcium-Vitamin D) .Marland Kitchen.. 1 Tab By Mouth Once Daily 10)  Fish Oil 1200 Mg Caps (Omega-3 Fatty Acids) .... Take 2 Tablets By Mouth in Am and Pm 11)  Cyanocobalamin 1000 Mcg Tabs (Cyanocobalamin) .... Take One Tablet By Mouth Once A Day 12)  Omeprazole 40 Mg Cpdr (Omeprazole) .... Take 1 Tablet By Mouth Once A Day 13)  Pravastatin Sodium 20 Mg Tabs (Pravastatin Sodium) .... Take 1 Tablet By Mouth Once A Day  At Bedtime 14)  Grape Seed Extract 146-60 Mg Tabs (Misc Natural Products) .... Take One Tablet By Mouth Twice A Day 15)  Gabapentin 300 Mg Caps (Gabapentin) .Marland Kitchen.. 1  Tab By Mouth Once Daily 16)  Amoxicillin .... As Needed Dental 17)  Furosemide 20 Mg Tabs (Furosemide) ....  Take One Tablet By Mouth Two Times A Day 18)  Klor-Con M10 10 Meq Cr-Tabs (Potassium Chloride Crys Cr) .... One Tablet By Mouth Once Daily 19)  Calan Sr 120 Mg Cr-Tabs (Verapamil Hcl) .... Onwe Tablet By Mouth Once Daily  Allergies (verified): 1)  ! Exforge (Amlodipine Besylate-Valsartan)  Comments:  Nurse/Medical Assistant: The patient's medications and allergies were reviewed with the patient and were updated in the Medication and Allergy Lists. Avon Gully CMA, Duncan Dull) (October 31, 2010 1:43 PM)  Past History:  Past Surgical History: Last updated: 08/10/2008 Total Hysterectomy -  Right Total KNee Replacement 11-03-07 Left knee replacement 03-29-08  Family History: Last updated: 08/10/2008 Colon Ca-Father  Parent-DM, hi chol, HTN, afib  mother with alzheimers, afib  Father with MI at age 62 Sister with DM, afib  Social History: Last updated: 06/26/2007 Retired from [employer].  12 yrs education.  Divorced from [employer].  One child.  Quit smoking 1972, no EtOH, no drugs, 2 caff/day, works out in the yard.   Past Medical History: ABIs on 11-28-06, Right 1.4, left 1.35 EKG - Left bundle branch block  Family History: Reviewed history from 08/10/2008 and no changes required. Colon Ca-Father  Parent-DM, hi chol, HTN, afib  mother with alzheimers, afib  Father with MI at age 92 Sister with DM,  afib  Social History: Reviewed history from 06/26/2007 and no changes required. Retired from [employer].  12 yrs education.  Divorced from [employer].  One child.  Quit smoking 1972, no EtOH, no drugs, 2 caff/day, works out in the yard.   Physical Exam  General:  Well-developed,well-nourished,in no acute distress; alert,appropriate and cooperative throughout examination Head:  Normocephalic and atraumatic without obvious abnormalities. No apparent alopecia or balding. Eyes:  No corneal or conjunctival inflammation noted. EOMI. Perrla. Ears:  External ear exam shows no significant  lesions or deformities.  Otoscopic examination reveals clear canals, tympanic membranes are intact bilaterally without bulging, retraction, inflammation or discharge. Hearing is grossly normal bilaterally. Nose:  External nasal examination shows no deformity or inflammation. Mouth:  Oral mucosa and oropharynx without lesions or exudates.  Teeth in good repair. Neck:  No deformities, masses, or tenderness noted. Chest Wall:  No deformities, masses, or tenderness noted. Breasts:  No mass, nodules, thickening, tenderness, bulging, retraction, inflamation, nipple discharge or skin changes noted.   Lungs:  Normal respiratory effort, chest expands symmetrically. Lungs are clear to auscultation, no crackles or wheezes. Heart:  Normal rate and regular rhythm. S1 and S2 normal without gallop, murmur, click, rub or other extra sounds. Abdomen:  Bowel sounds positive,abdomen soft and non-tender without masses, organomegaly or hernias noted. Msk:  No deformity or scoliosis noted of thoracic or lumbar spine.   Pulses:  R and L carotid,radial,dorsalis pedis and posterior tibial pulses are full and equal bilaterally Extremities:  No clubbing, cyanosis, edema, or deformity noted with normal full range of motion of all joints.   Neurologic:  No cranial nerve deficits noted. Station and gait are normal. DTRs are symmetrical throughout. Sensory, motor and coordinative functions appear intact. Skin:  no rashes.   Cervical Nodes:  No lymphadenopathy noted Axillary Nodes:  No palpable lymphadenopathy Psych:  Cognition and judgment appear intact. Alert and cooperative with normal attention span and concentration. No apparent delusions, illusions, hallucinations   Impression & Recommendations:  Problem # 1:  HEALTH MAINTENANCE EXAM (ICD-V70.0) Exam is normal. Congratulated her on her wieght loss.  Her BP does look better on her current regimen.   Try to get some exercise to prevent decnditioning Make sure getting  adequate calcium in her diet Due for labs and her mammogram Bone density due next year Vaccines are up to date.  She would like to have vit D checkd.   Complete Medication List: 1)  Fluticasone Propionate 50 Mcg/act Susp (Fluticasone propionate) .... Two sprays each nostril daily 2)  Aspirin 325 Mg Tabs (Aspirin) .Marland Kitchen.. 1 once daily 3)  Losartan Potassium 100 Mg Tabs (Losartan potassium) .... One tablet by mouth once daily 4)  Cetirizine Hcl 10 Mg Tabs (Cetirizine hcl) .... Take 1 tablet by mouth once a day 5)  Metamucil 30.9 % Powd (Psyllium) .Marland Kitchen.. 1 rounded tsp 6)  Multivitamins Tabs (Multiple vitamin) .... Take 1 tablet by mouth once a day 7)  Bystolic 10 Mg Tabs (Nebivolol hcl) .Marland Kitchen.. 1 tab by mouth once daily 8)  Calcium-magnesium-zinc 333-133-8.3 Mg Tabs (Calcium-magnesium-zinc) .... Take 4 tablets a day by mouth 9)  Vitamin D-1000 Max St [phone] Mg-unit Tabs (Calcium-vitamin d) .Marland Kitchen.. 1 tab by mouth once daily 10)  Fish Oil 1200 Mg Caps (omega-3 Fatty Acids)  .... Take 2 tablets by mouth in am and pm 11)  Cyanocobalamin 1000 Mcg Tabs (Cyanocobalamin) .... Take one tablet by mouth once a day 12)  Omeprazole 40 Mg Cpdr (Omeprazole) .... Take  1 tablet by mouth once a day 13)  Pravastatin Sodium 20 Mg Tabs (Pravastatin sodium) .... Take 1 tablet by mouth once a day  at bedtime 14)  Grape Seed Extract 146-60 Mg Tabs (Misc natural products) .... Take one tablet by mouth twice a day 15)  Gabapentin 300 Mg Caps (Gabapentin) .Marland Kitchen.. 1  tab by mouth once daily 16)  Amoxicillin  .... As needed dental 17)  Furosemide 20 Mg Tabs (Furosemide) .... Take one tablet by mouth two times a day 18)  Klor-con M10 10 Meq Cr-tabs (Potassium chloride crys cr) .... One tablet by mouth once daily 19)  Calan Sr 120 Mg Cr-tabs (Verapamil hcl) .... Onwe tablet by mouth once daily  Other Orders: T-Comprehensive Metabolic Panel [phone]) T-Vitamin D (25-Hydroxy) [phone]) T-CBC No Diff ([id number])  Patient  Instructions: 1)  You do have some actinic keratoses on your face that I recommend  freezing off.   2)  Call and schedule your mammogram for this month 3)  Encourage some regular exercise every day.   Prescriptions: OMEPRAZOLE 40 MG CPDR (OMEPRAZOLE) Take 1 tablet by mouth once a day  #90 x 3   Entered and Authorized by:   [name] MD   Signed by:   [name] MD on 10/31/2010   Method used:   Electronically to        [facility]* (retail)             ,          Ph: [phone]       Fax: [phone]   RxID:   [id number] CETIRIZINE HCL 10 MG TABS (CETIRIZINE HCL) Take 1 tablet by mouth once a day  #90 Tablet x 3   Entered and Authorized by:   [name] MD   Signed by:   [name] MD on 10/31/2010   Method used:   Electronically to        [facility]* (retail)             ,          Ph: [phone]       Fax: [phone]   RxID:   [id number] FLUTICASONE PROPIONATE 50 MCG/ACT SUSP (FLUTICASONE PROPIONATE) two sprays each nostril daily  #3 x 3   Entered and Authorized by:   [name] MD   Signed by:   [name] MD on 10/31/2010   Method used:   Electronically to        [facility]* (retail)             ,          Ph: [phone]       Fax: [phone]   RxID:   [id number]    Orders Added: 1)  T-Comprehensive Metabolic Panel [[id number]] 2)  T-Vitamin D (25-Hydroxy) [[id number]] 3)  T-CBC No Diff [[id number]] 4)  Est. Patient age 60&> [38101]   Immunization History:  Influenza Immunization History:    Influenza:  historical (06/30/2010)   Immunization History:  Influenza Immunization History:    Influenza:  Historical (06/30/2010)  Last Flu Vaccine:  Fluvax 3+ (07/25/2009 10:13:18 AM) Flu Vaccine Next Due:  1 yr    Immunization History:  Influenza Immunization History:    Influenza:  historical (06/30/2010)  Prevention & Chronic Care Immunizations   Influenza vaccine:  Historical  (06/30/2010)   Influenza vaccine due: 07/25/2010    Tetanus booster: 07/25/2009: Tdap   Tetanus booster due: 07/26/2019  Pneumococcal vaccine: Historical  (10/08/2002)   Pneumococcal vaccine due: None    H. zoster vaccine: 09/21/2009: given  Colorectal Screening   Hemoccult: Not documented   Hemoccult due: Not Indicated    Colonoscopy: normal  (09/29/2008)   Colonoscopy due: 09/29/2013  Other Screening   Pap smear: Not documented   Pap smear due: Not Indicated    Mammogram: Normal  (09/13/2009)   Mammogram due: 09/2010    DXA bone density scan: abnormal  (09/12/2009)   DXA scan due: 09/2011    Smoking status: quit  (08/13/2006)  Lipids   Total Cholesterol: 189  (07/25/2009)   LDL: 111  (07/25/2009)   LDL Direct: Not documented   HDL: 60  (07/25/2009)   Triglycerides: 92  (07/25/2009)    SGOT (AST): 19  (07/25/2009)   SGPT (ALT): 17  (07/25/2009) CMP ordered    Alkaline phosphatase: 66  (07/25/2009)   Total bilirubin: 0.5  (07/25/2009)  Hypertension   Last Blood Pressure: 147 / 76  (10/31/2010)   Serum creatinine: 0.7  (05/31/2010)   Serum potassium 3.5  (05/31/2010) CMP ordered   Self-Management Support :    Hypertension self-management support: Not documented    Lipid self-management support: Not documented    Appended Document: CPE Actinic keratosis on her right facial cheek and right forehead and left forearm.

## 2010-11-13 ENCOUNTER — Ambulatory Visit (INDEPENDENT_AMBULATORY_CARE_PROVIDER_SITE_OTHER): Payer: BC Managed Care – PPO | Admitting: Family Medicine

## 2010-11-13 ENCOUNTER — Encounter: Payer: Self-pay | Admitting: Family Medicine

## 2010-11-13 DIAGNOSIS — L570 Actinic keratosis: Secondary | ICD-10-CM | POA: Insufficient documentation

## 2010-11-13 DIAGNOSIS — L820 Inflamed seborrheic keratosis: Secondary | ICD-10-CM | POA: Insufficient documentation

## 2010-11-14 ENCOUNTER — Ambulatory Visit
Admission: RE | Admit: 2010-11-14 | Discharge: 2010-11-14 | Disposition: A | Discharge status: Home / Self Care | Payer: BC Managed Care – PPO | Source: Ambulatory Visit | Attending: Family Medicine | Admitting: Family Medicine

## 2010-11-14 DIAGNOSIS — Z1239 Encounter for other screening for malignant neoplasm of breast: Secondary | ICD-10-CM

## 2010-11-21 ENCOUNTER — Encounter: Payer: Self-pay | Admitting: Family Medicine

## 2010-11-21 LAB — CONVERTED CEMR LAB
ALT: 15 units/L (ref 0–35)
Alkaline Phosphatase: 59 units/L (ref 39–117)
BUN: 23 mg/dL (ref 6–23)
CO2: 25 meq/L (ref 19–32)
Creatinine, Ser: 0.86 mg/dL (ref 0.40–1.20)
Glucose, Bld: 107 mg/dL — ABNORMAL HIGH (ref 70–99)
MCHC: 32.7 g/dL (ref 30.0–36.0)
Platelets: 215 10*3/uL (ref 150–400)
RBC: 4.21 M/uL (ref 3.87–5.11)
RDW: 13.3 % (ref 11.5–15.5)
Sodium: 140 meq/L (ref 135–145)

## 2010-11-23 NOTE — Assessment & Plan Note (Signed)
Summary: Cryotherapy   Vital Signs:  Patient profile:   75 year old female Height:      82 inches Weight:      281 pounds Pulse rate:   115 / minute BP sitting:   129 / 65  (right arm) Cuff size:   large  Vitals Entered By: Avon Gully CMA, Duncan Dull) (November 13, 2010 2:32 PM) CC: AK's on the face   CC:  AK's on the face.  Current Medications (verified): 1)  Fluticasone Propionate 50 Mcg/act Susp (Fluticasone Propionate) .... Two Sprays Each Nostril Daily 2)  Aspirin 325 Mg Tabs (Aspirin) .Marland Kitchen.. 1 Once Daily 3)  Losartan Potassium 100 Mg Tabs (Losartan Potassium) .... One Tablet By Mouth Once Daily 4)  Cetirizine Hcl 10 Mg Tabs (Cetirizine Hcl) .... Take 1 Tablet By Mouth Once A Day 5)  Metamucil 30.9 % Powd (Psyllium) .Marland Kitchen.. 1 Rounded Tsp 6)  Multivitamins  Tabs (Multiple Vitamin) .... Take 1 Tablet By Mouth Once A Day 7)  Bystolic 10 Mg Tabs (Nebivolol Hcl) .Marland Kitchen.. 1 Tab By Mouth Once Daily 8)  Calcium-Magnesium-Zinc 333-133-8.3 Mg Tabs (Calcium-Magnesium-Zinc) .... Take 4 Tablets A Day By Mouth 9)  Vitamin D-1000 Max St [phone] Mg-Unit Tabs (Calcium-Vitamin D) .Marland Kitchen.. 1 Tab By Mouth Once Daily 10)  Fish Oil 1200 Mg Caps (Omega-3 Fatty Acids) .... Take 2 Tablets By Mouth in Am and Pm 11)  Cyanocobalamin 1000 Mcg Tabs (Cyanocobalamin) .... Take One Tablet By Mouth Once A Day 12)  Omeprazole 40 Mg Cpdr (Omeprazole) .... Take 1 Tablet By Mouth Once A Day 13)  Pravastatin Sodium 20 Mg Tabs (Pravastatin Sodium) .... Take 1 Tablet By Mouth Once A Day  At Bedtime 14)  Grape Seed Extract 146-60 Mg Tabs (Misc Natural Products) .... Take One Tablet By Mouth Twice A Day 15)  Gabapentin 300 Mg Caps (Gabapentin) .Marland Kitchen.. 1  Tab By Mouth Once Daily 16)  Amoxicillin .... As Needed Dental 17)  Furosemide 20 Mg Tabs (Furosemide) .... Take One Tablet By Mouth Two Times A Day 18)  Klor-Con M10 10 Meq Cr-Tabs (Potassium Chloride Crys Cr) .... One Tablet By Mouth Once Daily 19)  Calan Sr 120 Mg Cr-Tabs  (Verapamil Hcl) .... Onwe Tablet By Mouth Once Daily  Allergies (verified): 1)  ! Exforge (Amlodipine Besylate-Valsartan)  Comments:  Nurse/Medical Assistant: The patient's medications and allergies were reviewed with the patient and were updated in the Medication and Allergy Lists. Avon Gully CMA, Duncan Dull) (November 13, 2010 2:34 PM)  Physical Exam  Skin:  She has one large irritated seb k on her right hair line and another along teh elft hair line.She has also has several scattered actinic keratoses.  Lesion on left forearm is either a AK or early squamous cell.    Impression & Recommendations:  Problem # 1:  SEBORRHEIC KERATOSIS, INFLAMED (ICD-702.11)  Cryotherapy performed. Pt tolerated well. If lesion on forearm or the right cheeck doesn't resolved then will need bx.   Orders: Cryotherapy/Destruction benign or premalignant lesion (1st lesion)  (17000) Cryotherapy/Destruction benign or premalignant lesion (2nd-14th lesions) (17003)  Problem # 2:  ACTINIC KERATOSIS (ICD-702.0)  Orders: Cryotherapy/Destruction benign or premalignant lesion (2nd-14th lesions) (17003)  Complete Medication List: 1)  Fluticasone Propionate 50 Mcg/act Susp (Fluticasone propionate) .... Two sprays each nostril daily 2)  Aspirin 325 Mg Tabs (Aspirin) .Marland Kitchen.. 1 once daily 3)  Losartan Potassium 100 Mg Tabs (Losartan potassium) .... One tablet by mouth once daily 4)  Cetirizine Hcl 10 Mg Tabs (  Cetirizine hcl) .... Take 1 tablet by mouth once a day 5)  Metamucil 30.9 % Powd (Psyllium) .Marland Kitchen.. 1 rounded tsp 6)  Multivitamins Tabs (Multiple vitamin) .... Take 1 tablet by mouth once a day 7)  Bystolic 10 Mg Tabs (Nebivolol hcl) .Marland Kitchen.. 1 tab by mouth once daily 8)  Calcium-magnesium-zinc 333-133-8.3 Mg Tabs (Calcium-magnesium-zinc) .... Take 4 tablets a day by mouth 9)  Vitamin D-1000 Max St [phone] Mg-unit Tabs (Calcium-vitamin d) .Marland Kitchen.. 1 tab by mouth once daily 10)  Fish Oil 1200 Mg Caps (omega-3 Fatty  Acids)  .... Take 2 tablets by mouth in am and pm 11)  Cyanocobalamin 1000 Mcg Tabs (Cyanocobalamin) .... Take one tablet by mouth once a day 12)  Omeprazole 40 Mg Cpdr (Omeprazole) .... Take 1 tablet by mouth once a day 13)  Pravastatin Sodium 20 Mg Tabs (Pravastatin sodium) .... Take 1 tablet by mouth once a day  at bedtime 14)  Grape Seed Extract 146-60 Mg Tabs (Misc natural products) .... Take one tablet by mouth twice a day 15)  Gabapentin 300 Mg Caps (Gabapentin) .Marland Kitchen.. 1  tab by mouth once daily 16)  Amoxicillin  .... As needed dental 17)  Furosemide 20 Mg Tabs (Furosemide) .... Take one tablet by mouth two times a day 18)  Klor-con M10 10 Meq Cr-tabs (Potassium chloride crys cr) .... One tablet by mouth once daily 19)  Calan Sr 120 Mg Cr-tabs (Verapamil hcl) .... Onwe tablet by mouth once daily  Patient Instructions: 1)  Wash your face with regular soap and water.    Orders Added: 1)  Cryotherapy/Destruction benign or premalignant lesion (1st lesion)  [17000] 2)  Cryotherapy/Destruction benign or premalignant lesion (2nd-14th lesions) [17003]

## 2010-12-05 NOTE — Letter (Signed)
Summary: Self Health Assessment/[employer]  Self Health Assessment/[employer]   Imported By: Maryln Gottron 11/27/2010 12:41:44  _____________________________________________________________________  External Attachment:    Type:   Image     Comment:   External Document

## 2010-12-29 ENCOUNTER — Encounter: Payer: Self-pay | Admitting: Cardiovascular Disease

## 2011-01-10 ENCOUNTER — Encounter: Payer: Self-pay | Admitting: Cardiovascular Disease

## 2011-01-10 ENCOUNTER — Ambulatory Visit (INDEPENDENT_AMBULATORY_CARE_PROVIDER_SITE_OTHER): Payer: BC Managed Care – PPO | Admitting: Cardiovascular Disease

## 2011-01-10 DIAGNOSIS — E785 Hyperlipidemia, unspecified: Secondary | ICD-10-CM

## 2011-01-10 DIAGNOSIS — I10 Essential (primary) hypertension: Secondary | ICD-10-CM

## 2011-01-10 DIAGNOSIS — R609 Edema, unspecified: Secondary | ICD-10-CM

## 2011-01-10 DIAGNOSIS — R55 Syncope and collapse: Secondary | ICD-10-CM

## 2011-01-10 DIAGNOSIS — R9431 Abnormal electrocardiogram [ECG] [EKG]: Secondary | ICD-10-CM

## 2011-01-10 NOTE — Patient Instructions (Signed)
Your physician recommends that you schedule a follow-up appointment in: 6 months  

## 2011-01-10 NOTE — Assessment & Plan Note (Signed)
Chronic LBBB  No evidence of high grade heart or AV block  F/U ECG 9/12

## 2011-01-10 NOTE — Progress Notes (Signed)
Charlotte [name] is referred today for Dobutamine induced NSVT and ? syncope in the past in the setting of LBBB.  The patient does not think she actually passed out.  She was well until several months ago when she experienced an episode where she fell to the ground.  She does not think she lost conscousness.  She has never had another episode.  She denies c/p.  She underwent a Dobutamine stress test where she had NSVT. Subsequent cahteterization demonstrated no obstructive CAD and preserved LV function.  She has had poorly controlled HTN but as her episode occured while on amlodipine in the setting of her LBBB, it was stopped.  I reviewed her BP and weight records for the last 4 months.  She is not well controlled with average systolics still 150-160.  Weight is still hight around 274.  Long discussion about dietary indiscretion, low carbs and exercise.  She use to do silver sneakers but poor balance limits her activity.  Will put her on stronger diuretic and add different calcium blocker to see if we can improve her BP and edema.  So long as she is obese, with varicosities and sednentary with salt indiscretion her edema will be hard to Rx  ROS: Denies fever, malais, weight loss, blurry vision, decreased visual acuity, cough, sputum, SOB, hemoptysis, pleuritic pain, palpitaitons, heartburn, abdominal pain, melena, lower extremity edema, claudication, or rash.   General: Obese Affect appropriate Healthy:  appears stated age HEENT: normal Neck supple with no adenopathy JVP normal no bruits no thyromegaly Lungs clear with no wheezing and good diaphragmatic motion Heart:  S1/S2 no murmur,rub, gallop or click PMI normal Abdomen: benighn, BS positve, no tenderness, no AAA no bruit.  No HSM or HJR Distal pulses intact with no bruits Plus one bilateral edema Neuro non-focal Skin warm and dry No muscular weakness   Current Outpatient Prescriptions  Medication Sig Dispense Refill  . amoxicillin (AMOXIL)  500 MG capsule Take 500 mg by mouth as needed. For dental appt      . aspirin 325 MG tablet Take 325 mg by mouth daily.        . Calcium-Magnesium-Zinc 333-133-5 MG TABS Take by mouth. 4 po daily       . cetirizine (ZYRTEC) 10 MG tablet Take 10 mg by mouth daily.        . fluticasone (VERAMYST) 27.5 MCG/SPRAY nasal spray 2 sprays by Nasal route daily.        . furosemide (LASIX) 20 MG tablet Take 20 mg by mouth 2 (two) times daily.        Marland Kitchen gabapentin (NEURONTIN) 300 MG capsule Take 300 mg by mouth 2 (two) times daily.       Marland Kitchen losartan (COZAAR) 100 MG tablet Take 100 mg by mouth daily.        . Misc Natural Products (GRAPE SEED XTRA PO) Take by mouth. 1 po bid       . Multiple Vitamin (MULTIVITAMIN) capsule Take 1 capsule by mouth daily.        . nebivolol (BYSTOLIC) 10 MG tablet Take 10 mg by mouth daily.        . Omega-3 Fatty Acids (FISH OIL) 1200 MG CAPS Take by mouth. 2 po bid       . omeprazole (PRILOSEC) 40 MG capsule Take 40 mg by mouth daily.        . potassium chloride (KLOR-CON) 10 MEQ CR tablet Take 10 mEq by mouth daily.        Marland Kitchen  pravastatin (PRAVACHOL) 20 MG tablet Take 20 mg by mouth daily.        . psyllium (METAMUCIL) 58.6 % powder Take 1 packet by mouth daily.        . verapamil (CALAN) 120 MG tablet Take 120 mg by mouth daily.        . vitamin B-12 (CYANOCOBALAMIN) 1000 MCG tablet Take 1,000 mcg by mouth daily.        Marland Kitchen VITAMIN D, CHOLECALCIFEROL, PO Take by mouth. 1 po daily         Allergies  Amlodipine besylate-valsartan  Electrocardiogram:  9/11  NSR LBBB LAD  Assessment and Plan

## 2011-01-10 NOTE — Assessment & Plan Note (Signed)
Well controlled.  Continue current medications and low sodium Dash type diet.    

## 2011-01-10 NOTE — Assessment & Plan Note (Signed)
Cholesterol is at goal.  Continue current dose of statin and diet Rx.  No myalgias or side effects.  F/U  LFT's in 6 months. Lab Results  Component Value Date   LDLCALC 111* 07/25/2009

## 2011-01-10 NOTE — Assessment & Plan Note (Signed)
Improved.  Continue current dose of lasix and low sodium intake

## 2011-01-11 ENCOUNTER — Telehealth: Payer: Self-pay | Admitting: Cardiovascular Disease

## 2011-01-11 NOTE — Telephone Encounter (Signed)
Pt would like refill

## 2011-01-12 MED ORDER — FUROSEMIDE 20 MG PO TABS: 20.0000 mg | ORAL_TABLET | Freq: Two times a day (BID) | ORAL | Status: DC

## 2011-01-12 MED ORDER — POTASSIUM CHLORIDE 10 MEQ PO TBCR: 10.0000 meq | EXTENDED_RELEASE_TABLET | Freq: Every day | ORAL | Status: DC

## 2011-01-12 MED ORDER — VERAPAMIL HCL 120 MG PO TABS: 120.0000 mg | ORAL_TABLET | Freq: Every day | ORAL | Status: DC

## 2011-01-12 MED ORDER — NEBIVOLOL HCL 10 MG PO TABS: 10.0000 mg | ORAL_TABLET | Freq: Every day | ORAL | Status: DC

## 2011-01-12 MED ORDER — LOSARTAN POTASSIUM 100 MG PO TABS: 100.0000 mg | ORAL_TABLET | Freq: Every day | ORAL | Status: DC

## 2011-01-22 ENCOUNTER — Other Ambulatory Visit: Payer: Self-pay | Admitting: *Deleted

## 2011-01-22 MED ORDER — CETIRIZINE HCL 10 MG PO TABS: 10.0000 mg | ORAL_TABLET | Freq: Every day | ORAL | Status: DC

## 2011-02-05 ENCOUNTER — Other Ambulatory Visit: Payer: Self-pay | Admitting: Family Medicine

## 2011-02-05 MED ORDER — CETIRIZINE HCL 10 MG PO TABS: 10.0000 mg | ORAL_TABLET | Freq: Every day | ORAL | Status: DC

## 2011-02-05 NOTE — Telephone Encounter (Signed)
Pt called and requested RF of generic Zyrtec 10mg .  When researched RX RF was sent on 01-22-11 #30/11 RFS and good through 01-22-2012,   PLAN:  LMOM with above info and told to call her pharm and tell them to get RF ready for her since Rx current and good til 2013. Jarvis Newcomer, LPN Domingo Dimes

## 2011-02-05 NOTE — Telephone Encounter (Signed)
Pt calls and states meds are not at [facility] in [place].  Review of chart shows that rx was sent to [facility]. Advised pt we would fix and to call pharm prior to picking up to assure ready.  She is agreeable.  RX corrected and sent to [facility].

## 2011-02-20 NOTE — Op Note (Signed)
NAMEVEENA, STURGESS                  ACCOUNT NO.:  [id number]   MEDICAL RECORD NO.:  [id number]          PATIENT TYPE:  INP   LOCATION:  0001                         FACILITY:  [facility]   PHYSICIAN:  Madlyn Frankel. Charlann Boxer, M.D.  DATE OF BIRTH:  1936/04/20   DATE OF PROCEDURE:  03/29/2008  DATE OF DISCHARGE:                               OPERATIVE REPORT   PREOPERATIVE DIAGNOSIS:  Left knee osteoarthritis.   POSTOPERATIVE DIAGNOSIS:  Left knee osteoarthritis.   PROCEDURE:  Left total knee replacement.   COMPONENTS USED:  T-rotating platform posterior stabilized knee system,  size 3 femur, 3 tibia, 12.5 insert and a 35 patella button inserted.   SURGEON:  Madlyn Frankel. Charlann Boxer, M.D.   ASSISTANT:  [name]. Mann, PA   ANESTHESIA:  Duramorph, spinal.   TOURNIQUET TIME:  60 minutes at 250 mmHg.   COMPLICATIONS:  None.   DRAINS:  X1.   BLOOD LOSS:  Minimal.   INDICATIONS FOR THE PROCEDURE:  Ms. North is a 75 year old patient of  mine, status post right total knee arthroplasty.  She has done very well  but she has end-stage changes in the left knee.  After successfully  making it through the right knee, she wishes at this point to proceed  with the left total knee.  Risks and benefits were reviewed prior to  consent being obtained.   PROCEDURE IN DETAIL:  The patient was brought to the operating theater.  Once adequate anesthesia and preoperative antibiotics, Ancef  administered, the patient was positioned supine with a proximal thigh  tourniquet placed.  The left lower extremity was prescrubbed and prepped  and draped in a sterile fashion.  Midline incision was made followed by  a median arthrotomy.  Following initial debridement, attention was  directed to the patella.  Precut measurement was 22 mm.  It was resected  down to about 13-14 mm using the 36 patella button, checking with this  button in place, the patella height was back to 22 mm.  I kept the  button in place to protect the cut  surface from __________ .   Attention was now directed to the femur following initial debridement  including fat pad synovial debridement.  I opened up the femoral canal  with a drill, irrigated to prevent fat emboli.  Intramedullary rod was  then passed and based on her short stature I decided to use a 3-degree  cut and resected 10 mm off the distal femur with 3-degrees valgus.  I  sized the femur to be a size 3, and now I attended to the tibia.  With  the tibia subluxated anteriorly I used the ex-medullary device.  The  perpendicular cut was made, resecting approximately 8 mm off the  proximal tibia based on the appearance of the radiographs and anatomy.  Once this cut was made, I checked with my extension block.  With the  12.5 mm extension block, the ROP component of the knee came out to full  extension.   We now attended to the femur.  With the distal femur exposed, we placed  the size 3 cutting block with the intramedullary rod at 3 degrees of  valgus.  I placed the stylus on the anterior aspect of the lateral  aspect of the distal femur to prevent notching and then locked this into  place.  I than placed a 12.5-mm shim, set our rotation and the femoral  component.  Once it was set at 90 degrees of flexion and assessing the  tibia cut too to make sure it was perpendicular as it was, I then pinned  this block into position.  I rechecked with the crab claw to make sure  that there was no notching and then placed the final pins.  The  anterior, posterior and chamfer cuts were all made at this point.  Final  block was made based off the lateral aspect of this femur, and assessing  with a size 3 trial.  Now attention was redirected back to the tibia.  Final preparation of the tibia was carried out using the size 3 tibial  tray, as this fit best when we cut the surface.  I drilled and keel  punched and did a trial reduction with a 3 femur, a 3 tibia and a 12.5  insert based on our  trials.  The knee came out to full extension and the  ligament was securely balanced in extension and flexion.  The patella  tracked without any application pressure through the center force on the  trochlea.  At this point all the trial components were removed and the  final components were opened.  The knee was irrigated with normal saline  solution, pulse lavaged.  The synovial capsule junction was injected  with 0.25% Marcaine with epinephrine and 1 mL of Toradol.   Once cement was ready, the final components were cemented into position  and the knee was brought into extension with the 12.5 insert.  Extruded  cement was removed.  Once the cement had secured, excessive cement was  removed throughout the knee.  Once I was satisfied that I was unable to  visualize any cement at this point, the final 12.5 insert was placed.  We reirrigated the knee out and placed a medium Hemovac drain.  I  reapproximated the extensor mechanism using #1 Vicryl with the knee in  flexion.  The remainder of the wound was closed with 2-0 Vicryl and a 4-  0 running Monocryl.  The skin was cleaned and dried and rinsed sterilely  with Steri-Strips and a bulky sterile wrap.  She was brought to the  recovery room in stable condition, tolerated the procedure well.      Madlyn Frankel Charlann Boxer, M.D.  Electronically Signed     MDO/MEDQ  D:  03/29/2008  T:  03/29/2008  Job:  [id number]

## 2011-02-20 NOTE — Discharge Summary (Signed)
Charlotte [name], Charlotte [name]                  ACCOUNT NO.:  [id number]   MEDICAL RECORD NO.:  [id number]          PATIENT TYPE:  INP   LOCATION:  1604                         FACILITY:  [facility]   PHYSICIAN:  Madlyn Frankel. Charlann Boxer, M.D.  DATE OF BIRTH:  Nov 03, 1935   DATE OF ADMISSION:  03/29/2008  DATE OF DISCHARGE:                               DISCHARGE SUMMARY   ADMITTING DIAGNOSES:  1. Osteoarthritis.  2. Hypertension.  3. Hypercholesteremia.  4. Reflux disease.  5. Osteopenia.  6. Cataracts.  7. Irritable bowel syndrome.   DISCHARGE DIAGNOSES:  1. Osteoarthritis.  2. Hypertension.  3. Hypercholesteremia.  4. Reflux disease.  5. Osteoporosis.  6. Cataract.  7. Irritable bowel syndrome.  8. Postoperative hypokalemia, resolved at the time of discharge.   HISTORY OF PRESENT ILLNESS:  This is a 75 year old female with a history  of left knee pain secondary to osteoarthritis.  It was refractory to all  conservative treatment.  She also has a history of a right total knee  replacement and did extremely well with the rehab process.   PROCEDURE:  Left total knee arthroplasty by surgeon Dr. Durene Romans and  assistant, Dwyane Luo, PA-C.   CONSULTS:  None.   LABS ON ADMISSION:  Hemoglobin and hematocrit were 12.2 and 35.9 and  platelets were 202 and tracked throughout her course of stay.  At  discharge, her hemoglobin was 10.1, hematocrit 29.4, platelets were 148.  Routine chemistry on admission:  Sodium 136, potassium 3.6, glucose 135,  creatinine 0.63.  At the time of discharge, sodium 135, potassium 3.3,  creatinine 0.52, glucose 138.  Coagulation on admission:  PT 13.8, INR  1.  UA was negative.   RADIOLOGY:  Chest two-view no acute findings.   CARDIOLOGY:  EKG:  Normal sinus rhythm.   HOSPITAL COURSE:  The patient was admitted to the hospital and underwent  a left total knee replacement and admitted to the orthopedic floor.  She  remained afebrile throughout her course of stay.  She  remained  hemodynamically stable, although she did have a small drop in her  potassium which was replenished while there.  Her dressing was changed.  After day 1, no sign of any drainage or infection.  Her Hemovac was  discontinued on day 1.  She remained neurovascularly intact of her left  lower extremity throughout her course of stay.  Her quads did improve in  function.  She is still working on a straight-leg raise prior to  discharge.  She was weightbearing as tolerated.  She did make good  progress with physical therapy, although still requiring assistance and  increase strength in order to reach independence.   DISCHARGE DISPOSITION:  Discharged to skilled nurse facility rehab for  further improvement in strength, mobility, balance and independence.  She was in stable and improved condition.   DISCHARGE DIET:  Heart healthy, regular.   DISCHARGE WOUND CARE:  Keep wound dry.   DISCHARGE PHYSICAL THERAPY:  She is weightbearing as tolerated with the  use of a rolling walker.  Goals of physical therapy will be range  of  motion 0-100 at 2 weeks, 0-120 at 6 weeks.  We want to increase  strength, minimize pain and increase activities of daily living.   DISCHARGE MEDICATIONS:  1. Lovenox 40 mg subcutaneous every 24 hours x11 days.  2. Then hen start enteric-coated aspirin 325 mg one p.o. daily x4      weeks after Lovenox completed.  3. Robaxin 500 mg one p.o. every 6 hours p.r.n. muscle spasm or pain.  4. Iron 325 mg one p.o. t.i.d. x2 weeks.  5. Colace 100 mg p.o. b.i.d. p.r.n. constipation.  6. MiraLAX 17 grams p.o. daily p.r.n. constipation.  7. Vicodin 5/325 one to two p.o. every 4-6 hours p.r.n. pain.  8. Fluticasone nasal spray 2 sprays each nostril at bedtime.  9. Nadolol 40 mg one p.o. every morning.  10.Benazepril/HCTZ 20/25 mg tablets one p.o. every morning.  11.Prilosec over-the-counter one p.o. every morning before breakfast.  12.Loratadine 10 mg one p.o. nightly  p.r.n.  13.Metamucil 1 teaspoon every night p.o. p.r.n.  14.Calcium citrate with vitamin D 2 tablets twice a day.  15.Vitamin D 1,000 units one p.o. daily.  16.Multivitamin p.o. daily, patient may self-administered.   DISCHARGE FOLLOWUP:  Followup with Dr. Charlann Boxer at phone number [phone] in  10-14 days for wound check.   SPECIAL INSTRUCTIONS:  TED hose to be worn, on 12 hours and off 12  hours.     ______________________________  Charlotte [name] Charlotte [name], Charlotte [name]      Madlyn Frankel. Charlann Boxer, M.D.  Electronically Signed   BLM/MEDQ  D:  04/01/2008  T:  04/01/2008  Job:  [id number]

## 2011-02-20 NOTE — Discharge Summary (Signed)
NAMESHAILY, LIBRIZZI                  ACCOUNT NO.:  [id number]   MEDICAL RECORD NO.:  [id number]          PATIENT TYPE:  INP   LOCATION:  1619                         FACILITY:  [facility] ([place])   PHYSICIAN:  Madlyn Frankel. Charlann Boxer, M.D.  DATE OF BIRTH:  [birth year]   DATE OF ADMISSION:  11/03/2007  DATE OF DISCHARGE:  11/06/2007                               DISCHARGE SUMMARY   ADMISSION DIAGNOSES:  1. Osteoarthritis.  2. Hypertension.  3. Hypercholesteremia.  4. Reflux disease.  5. Irritable bowel syndrome.  6. Osteopenia.   DISCHARGE DIAGNOSES:  1. Osteoarthritis.  2. Hypertension.  3. Hypercholesteremia.  4. Reflux disease.  5. Irritable bowel syndrome.  6. Osteopenia.   CONSULTATION:  None.   PROCEDURE:  Right total knee replacement.  Surgeon:  Dr. Durene Romans.  Assistant:  Dwyane Luo PA-C.   HISTORY OF PRESENT ILLNESS:  A 75 year old female with a history of  right knee pain secondary to osteoarthritis refractory to all  conservative treatment.   LABS:  Hematocrit postop day #1 dropped down to 28.5, at time of  discharge was stable at 30.8.  Routine chemistry:  Stable throughout her  course of stay.  At discharge sodium was 137, potassium 3.8, creatinine  0.61, glucose 124.  INR 1.  Preadmission UA:  Negative.   RADIOLOGY:  Chest two-view:  No acute findings.   CARDIOLOGY:  EKG:  Normal sinus rhythm with full cardiology workup and  clearance.  Normal stress nuclear study.   HOSPITAL COURSE:  The patient underwent right total knee replacement and  tolerated the procedure well.  Was admitted to the orthopedic floor.  She remained afebrile throughout her course of stay.  Hemodynamically  she remained stable with a rise in hematocrit at discharge.  Dressing  was changed on a daily basis after postop day #1 with no significant  ooze.  No sign of infection.  She was neurovascularly intact to her  right lower extremity throughout her course stay.  She had good colonic  function prior to  discharge.  She was weightbearing as tolerated.  Other  than a sore throat due to the general intubation, there were no  complications.  She was able to make minimal progress during the course  of stay, requiring further rehab for strengthening and safety and  independence in activities of daily living.   DISCHARGE DISPOSITION:  Discharge to skilled nursing facility rehab for  further strengthening and mobility exercises.  Stable and improved  condition.   DISCHARGE PHYSICAL THERAPY:  She is weightbearing as tolerated with the  use of a rolling walker with transition to a single-point cane after 2  weeks after balance and strength are improved.  Goals of therapy will be  to reach a range of motion of 0-100 degrees at 2 weeks, 0-120 at 6  weeks.  Want to maintain and increase quad strengthening, targeting the  VMO, also to target the hamstrings, calves as well as the hip flexors,  abductors and adductors.  Work on Investment banker, operational and proprioception  exercises.   DISCHARGE WOUND CARE:  Keep dry.  DISCHARGE DIET:  Regular as tolerated by the patient.   DISCHARGE MEDICATIONS:  1. Lovenox 40 mg subcutaneous q24h. x11 days.  2. Robaxin 500 mg one p.o. q.6h. muscle spasm pain.  3. Vicodin 5/325 mg one to two p.o. q.4-6h. pain.  4. Iron 325 mg one p.o. t.i.d. x2 weeks.  5. Enteric-coated aspirin 325 mg one p.o. daily x4 weeks after the      Lovenox is completed.  6. Colace 100 mg one p.o. b.i.d. constipation.  7. MiraLax 17 g one p.o. daily.   HOME MEDICATIONS:  1. Benazepril/hydrochlorothiazide 20/25 mg one p.o. q.a.m.  2. Nadolol 20 mg two tablets p.o. q.a.m.  3. Loratadine one p.o. q.p.m.  4. Fluticasone nasal spray, 2 sprays each nostril q.p.m.  5. Metamucil one teaspoon daily.  6. Calcium plus D p.o. b.i.d.  7. Multivitamin p.o. daily.   DISCHARGE WOUND CARE:  Keep wound dry, change dressing on a daily basis.   DISCHARGE FOLLOW-UP:  Follow up with Dr. Charlann Boxer at phone number  [phone]  in 2 weeks.     ______________________________  [name] [address], [place]      Madlyn Frankel. Charlann Boxer, M.D.  Electronically Signed    BLM/MEDQ  D:  11/06/2007  T:  11/06/2007  Job:  [id number]

## 2011-02-20 NOTE — H&P (Signed)
Charlotte [name], Charlotte [name]                  ACCOUNT NO.:  [id number]   MEDICAL RECORD NO.:  [id number]          PATIENT TYPE:  INP   LOCATION:  NA                           FACILITY:  [facility]   PHYSICIAN:  Madlyn Frankel. Charlann Boxer, M.D.  DATE OF BIRTH:  March 31, 1936   DATE OF ADMISSION:  11/03/2007  DATE OF DISCHARGE:                              HISTORY & PHYSICAL   PROCEDURE:  Right total knee arthroplasty.   CHIEF COMPLAINTS:  Right knee pain.   HISTORY OF PRESENT ILLNESS:  A 75 year old female with a history of  right knee pain secondary to osteoarthritis.  It has been refractory to  all conservative treatment.  She has been presurgically assessed by her  cardiologist, Dr. Gala Romney.   PAST MEDICAL HISTORY:  1. Osteoarthritis.  2. Hypertension.  3. Hypercholesteremia.  4. Reflux disease.  5. Irritable bowel syndrome.  6. Osteopenia.   PAST SURGICAL HISTORY:  Hysterectomy.   FAMILY HISTORY:  Heart disease, hypertension, cancer, arthritis,  Alzheimer's.   SOCIAL HISTORY:  The patient is divorced, retired.  Planning on a rehab  stay after surgery.   DRUG ALLERGIES:  No known drug allergies.   MEDICATIONS:  1. Aspirin 81 mg two a day.  2. Loratadine 10 mg one a day.  3. Benazepril/HCTZ 20/25 mg one a day.  4. Fluticasone nasal spray 50 mcg 2 puffs in each nostril daily.  5. Calcium plus vitamin D four a day.  6. Omega-3 fish oil 1000 mg two a day.  7. Glucosamine plus MSM 1500/1500 mg two a day.  8. Multivitamin 1 a day.  9. Vitamin B12 1000 mcg one a day.  10.Tylenol Arthritis 650 mg p.r.n.  11.Nadolol tablet 40 mg one a day.  12.Metamucil powder 1 teaspoon daily.  13.Nature's Pearl 1300 mg two a day.  14.Prilosec over-the-counter.  15.Omeprazole 1 tablet a day.   REVIEW OF SYSTEMS:  CARDIOVASCULAR:  EKG last done on September 16, 2007  with a full stress test on September 18, 2007.  GENITOURINARY:  Incontinence.  Otherwise see HPI.   PHYSICAL EXAM:  Pulse 60, respirations 18, blood  pressure 128/74.  GENERAL:  Awake, alert and oriented, well-developed, well-nourished, in  no acute distress.  NECK:  Supple.  No carotid bruits.  CHEST:  The lungs are clear to auscultation bilaterally.  BREASTS:  Deferred.  HEART:  Regular rate and rhythm without gallops, clicks, rubs or  murmurs.  ABDOMEN:  Soft, nontender, nondistended.  Bowel sounds present.  GENITOURINARY:  Deferred.  EXTREMITIES:  Dorsalis pedis pulse positive.  She does have edema.  SKIN:  No cellulitis.  NEUROLOGIC:  Intact distal sensibilities.   LABS:  Pending presurgical testing.   CARDIOLOGY:  EKG showed normal sinus rhythm.  Stress test:  Ejection  fraction 74%.  Presurgically cleared.   RADIOLOGY:  Chest x-ray pending.   IMPRESSION:  Right knee osteoarthritis.   PLAN OF ACTION:  Right total knee arthroplasty at [facility]  November 03, 2007, by surgeon Dr. Durene Romans.  Risks and complications  were discussed.   POSTOPERATIVE DISPOSITION:  The patient  will require rehab placement  postoperatively.  This was agreed to by the patient.     ______________________________  [name]. [address], [place]      Madlyn Frankel. Charlann Boxer, M.D.  Electronically Signed    BLM/MEDQ  D:  10/29/2007  T:  10/30/2007  Job:  [id number]   cc:   [name], M.D.  [phone]  [place], [place] [address]   Bevelyn Buckles. Bensimhon, MD  [address] [place] [address]   Petra Kuba, M.D.  Fax: [phone]

## 2011-02-20 NOTE — Op Note (Signed)
Charlotte [name], Charlotte [name]                  ACCOUNT NO.:  [id number]   MEDICAL RECORD NO.:  [id number]          PATIENT TYPE:  INP   LOCATION:  0004                         FACILITY:  [facility]   PHYSICIAN:  Madlyn Frankel. Charlann Boxer, M.D.  DATE OF BIRTH:  [birth year]   DATE OF PROCEDURE:  11/03/2007  DATE OF DISCHARGE:                               OPERATIVE REPORT   PREOPERATIVE DIAGNOSIS:  Right knee osteoarthritis.   POSTOPERATIVE DIAGNOSIS:  Right knee osteoarthritis.   OPERATION/PROCEDURE:  Right total knee replacement.   COMPONENTS USED:  Rotating platform posterior stabilized knee system,  size 3 femur, 3 tibia, 12.5 insert and 38-mm patella.   SURGEON:  Madlyn Frankel. Charlann Boxer, M.D.   ASSISTANT:  [name]. Mann, PA.   ANESTHESIA:  General plus regional block postoperatively.   DRAINS:  One.   COMPLICATIONS:  None.   TOURNIQUET TIME:  39 minutes 300 mmHg.   INDICATIONS:  Charlotte [name] is a pleasant 75 year old female who presented  to the office for evaluation of bilateral knee osteoarthritis, right  greater left.  She had failed conservative measures and wished to review  surgical options.  Risks risks of knee replacement surgery including  infection, DVT, component failure, need for revision surgery as well as  stiffness postop course and expectation were all reviewed.  Consent  obtained.   PROCEDURE IN DETAIL:  The patient was brought to operative theater.  Once adequate anesthesia and preoperative antibiotics, 2 grams Ancef,  administered, the patient was positioned supine.  A right thigh  tourniquet placed.  The right lower extremity prescrubbed and prepped  and draped in sterile fashion.  The patient had a very large lower  extremity with significant adipose tissue.  A midline incision was made.  Extensor mechanism was identified and median arthrotomy created.  Following initial debridement intra-articularly, fat and synovium as  well as proximal medial peel, attention was directed to the  patella.  Patellar exposure and debridement of synovium in the proximal and  inferior aspects of the patella were carried out.  Precut measurement  was 22 mm.  I resected down to 13 mm, used the 38 patellar button with  excellent coverage.  I placed a metal shield to protect the patella from  retractors.  At this point attention was directed femur.  Femoral canal  was opened with a drill following debridement osteophytes and the  cruciate ligaments.  I irrigated the canal prevent fat emboli and  intramedullary rod was then passed at 5 degrees valgus.  I resected 10  mm of bone off the distal femur.  I then sized the femur to be a size 3.  I pinned it in position and based on the posterior condylar axis, this  matched perpendicular Whiteside line.  The anterior-posterior chamfer  cuts were then made.   At this point the box cut was made to finish off femur based off the  lateral aspect of distal femur.  Attention was now directed to the  tibia.   Tibial subluxation was carried out easily with removal of meniscal  stumps as well as the  ligament stump.  Using extramedullary guide based  off the proximal tibia, I  resected 10 mm of bone off laterally.   Checked my extension block.  Was happy that was at least came out to 10  extension with 10 mm insert.  I then prepared the proximal tibia with a  size 3 tray and then positioned, drilled and keel punch checked  alignment rod.  Was that the cut was perpendicular in both planes.  Trial reduction was carried out with 3 femur, 3 tibia, and 12.5 insert.  Knee came out to extension and importantly the ligaments were stable  from extension to flexion.  The patella tracked without application of  the pressure.  At this point, all trial components removed.  The knee  with synovial capsule junction was injected with the course of Marcaine  with epinephrine 1 mL of Toradol for 61 mL.  The knee was then irrigated  with pulse lavage.  The cement was  mixed and components opened.  The  components were then cemented into position on the dried, cut surfaces.  Tibia first, femur and patella.  The 12.5 insert was placed and the knee  was brought to extension and extruded cement was removed.  Once the  cement had cured, the knee was brought to flexion and extension and  remaining cement was removed from the medial and lateral aspects of the  knee.  Once I was satisfied there was no visualized loose cement  fragments, I irrigated the knee once again and placed the final 12.5  insert center until it was let down to 39 minutes. .   Again, we irrigated the knee, placed a medium Hemovac drain deep.  Extensor mechanism was then reapproximated using #1 Vicryl with the knee  in flexion.  The remaining wound was closed 2-0 Vicryl and 4-0 running  Monocryl.  The patient's adipose tissue was quite significant and  resulted in lab closure purposes due to nature of the skin.  The skin  was then was cleaned, dried and dressed sterilely with Steri-Strips and  bulky wrap.  She was brought to recovery room after the femoral block  placed by Dr. Vernice Jefferson, extubated in stable condition.      Madlyn Frankel Charlann Boxer, M.D.  Electronically Signed     MDO/MEDQ  D:  11/03/2007  T:  11/03/2007  Job:  [id number]

## 2011-02-20 NOTE — H&P (Signed)
Charlotte [name], GELBER                  ACCOUNT NO.:  [id number]   MEDICAL RECORD NO.:  [id number]           PATIENT TYPE:   LOCATION:                                 FACILITY:   PHYSICIAN:  Madlyn Frankel. Charlann Boxer, M.D.  DATE OF BIRTH:  Nov 18, 1935   DATE OF ADMISSION:  03/29/2008  DATE OF DISCHARGE:                              HISTORY & PHYSICAL   PROCEDURE:  Left total knee arthroplasty.   CHIEF COMPLAINT:  Left knee pain.   HISTORY OF PRESENT ILLNESS:  [age]-year-old female with a history of  left knee pain secondary to osteoarthritis.  It has been refractory to  all conservative treatment.  She does have a history of a right total  knee replacement and is doing extremely well with this.  She has  recently started and stopped a statin drug due to muscle pain, weakness  and has developed a rash on her right lower extremity.  Otherwise no  health changes.   PAST MEDICAL HISTORY:  Significant for:  1. Osteoarthritis.  2. Hypertension.  3. Hypercholesterolemia.  4. Reflux disease.  5. Osteopenia.  6. Cataracts.  7. Irritable bowel syndrome.   PAST SURGICAL HISTORY:  1. Hysterectomy in 1985.  2. Right total knee replacement in January 2009.   FAMILY HISTORY:  Congestive heart failure, cancer, arthritis,  Alzheimer's, hypertension, heart dysrhythmias.   SOCIAL HISTORY:  Patient is retired.  She is planning on going to  [facility] for rehabilitation afterward, where she went for her  right knee.   DRUG ALLERGIES:  No known drug allergies.   MEDICATIONS:  1. Benazepril hydrochlorothiazide 20/25 mg 1 p.o. q.a.m.  2. Nadolol 40 mg 1 p.o. q.a.m.  3. Loratadine 10 mg 1 p.o. nightly.  4. Metamucil 1 teaspoon nightly.  5. Calcium plus vitamin D 315/200 units, 4:  2 at night and 2 in he      morning.  6. Vitamin D 1000 units 1 at night.  7. Tylenol Arthritis 650 mg p.r.n.  8. __________  nasal spray 15 mcg 2 puffs each nostril daily.  9. Omeprazole 20 mg 1 p.o. q.a.m. before  meal.   REVIEW OF SYSTEMS:  See HPI.   PHYSICAL EXAMINATION:  Pulse 64, respirations 18, blood pressure 102/72.  GENERAL:  Awake, alert and oriented.  NECK:  Supple, no carotid bruits.  CHEST:  Lungs clear to auscultation bilaterally.  BREASTS:  Deferred.  HEART:  Regular rate and rhythm.  S1, S2 distinct.  ABDOMEN:  Soft.  Bowel sounds are present.  GENITOURINARY;  Deferred.  EXTREMITIES:  Left lower extremity comes out to full extension, flexes  110 degrees plus.  Right lower extremity does have a psoriatic appearing  rash of her skin.  SKIN:  As noted in extremities, does have a right lower extremity  psoriatic rash.  The left lower extremity has no signs of cellulitis or  infection.  NEUROLOGIC:  Intact distal sensibilities.   LABORATORY DATA:  EKG, chest x-ray, all pending presurgical testing.   IMPRESSION:  Left knee osteoarthritis.   PLAN OF ACTION:  Left total knee arthroplasty  at [facility]  March 29, 2008 by the surgeon, Dr. Durene Romans.  Risks and complications  were discussed.   Postoperative medications were not provided as the patient is planning  on going to [facility] for rehabilitation postoperatively.     ______________________________  Charlotte [name] [address], [place]      Madlyn Frankel. Charlann Boxer, M.D.  Electronically Signed    BLM/MEDQ  D:  03/24/2008  T:  03/24/2008  Job:  [id number]

## 2011-02-23 NOTE — Op Note (Signed)
   NAMEMASAE, Charlotte [name]                            ACCOUNT NO.:  [id number]   MEDICAL RECORD NO.:  [id number]                   PATIENT TYPE:  AMB   LOCATION:  ENDO                                 FACILITY:  [facility]   PHYSICIAN:  Petra Kuba, M.D.                 DATE OF BIRTH:  12-Sep-1936   DATE OF PROCEDURE:  02/04/2003  DATE OF DISCHARGE:                                 OPERATIVE REPORT   PROCEDURE:  Colonoscopy.   INDICATION:  Family history, due for colonic screening.   Consent was signed after risks, benefits and options thoroughly discussed  multiple times in the past.   MEDICINES USED:  Demerol 50 mg, Versed 4 mg.   DESCRIPTION OF PROCEDURE:  Rectal inspection was pertinent for external  hemorrhoids.  Digital exam was negative.  The video colonoscope was inserted  and advanced around the colon to the cecum, this did require some abdominal  pressure and position changes.  The cecum was identified by the appendiceal  orifice and the ileocecal valve.  No abnormalities were seen on insertion.  The scope was slowly withdrawn.  The prep was adequate, there was some  liquid stool that required washing and suctioning.  On slow withdrawal  through the colon, no polyps, masses or diverticula were seen as we slowly  went through back to the rectum.  Once back in the rectum, the scope was  retroflexed and pertinent for some internal hemorrhoids.  The scope was  straightened and readvanced towards the left side of the colon, the air was  suctioned and the scope removed.  The patient tolerated the procedure well  and there was no evidence of any complication.   ENDOSCOPIC DIAGNOSES:  1. Internal and external hemorrhoids.  2. Otherwise within normal limits to the cecum.   PLAN:  Yearly rectals and guaiacs per Dr. Theresia Lo, happy to see back  p.r.n., otherwise repeat screening in five years.                                               Petra Kuba, M.D.    MEM/MEDQ  D:   02/04/2003  T:  02/05/2003  Job:  [id number]   cc:   Vikki Ports, M.D.  [address]  [place]  [place] [address]  Fax: [phone]

## 2011-06-29 LAB — CBC
HCT: 30.8 — ABNORMAL LOW
HCT: 35.7 — ABNORMAL LOW
Hemoglobin: 10.7 — ABNORMAL LOW
MCHC: 34.6
MCHC: 34.6
MCHC: 34.8
MCV: 89
RBC: 3.19 — ABNORMAL LOW
RBC: 3.46 — ABNORMAL LOW
RBC: 4.02
WBC: 7

## 2011-06-29 LAB — ABO/RH: ABO/RH(D): A POS

## 2011-06-29 LAB — URINALYSIS, ROUTINE W REFLEX MICROSCOPIC
Bilirubin Urine: NEGATIVE
Glucose, UA: NEGATIVE
Hgb urine dipstick: NEGATIVE
Specific Gravity, Urine: 1.026
pH: 6.5

## 2011-06-29 LAB — BASIC METABOLIC PANEL
BUN: 20
CO2: 24
CO2: 31
CO2: 31
Calcium: 8.3 — ABNORMAL LOW
Calcium: 9.4
Chloride: 106
Creatinine, Ser: 0.54
Creatinine, Ser: 0.65
GFR calc Af Amer: >60
GFR calc Af Amer: >60
GFR calc Af Amer: >60
GFR calc non Af Amer: >60
GFR calc non Af Amer: >60
Potassium: 3.5
Potassium: 3.8
Sodium: 136
Sodium: 137

## 2011-06-29 LAB — TYPE AND SCREEN
ABO/RH(D): A POS
Antibody Screen: NEGATIVE

## 2011-06-29 LAB — DIFFERENTIAL
Basophils Relative: 0
Eosinophils Absolute: 0.1
Eosinophils Relative: 1
Lymphs Abs: 1.7
Monocytes Relative: 8
Neutrophils Relative %: 67

## 2011-07-05 LAB — URINALYSIS, ROUTINE W REFLEX MICROSCOPIC
Bilirubin Urine: NEGATIVE
Glucose, UA: NEGATIVE
Ketones, ur: NEGATIVE
pH: 6

## 2011-07-05 LAB — DIFFERENTIAL
Basophils Absolute: 0
Basophils Relative: 0
Eosinophils Absolute: 0.1
Eosinophils Relative: 2
Monocytes Absolute: 0.6

## 2011-07-05 LAB — BASIC METABOLIC PANEL
CO2: 26
CO2: 27
Chloride: 100
Chloride: 104
Creatinine, Ser: 0.52
Creatinine, Ser: 0.56
GFR calc Af Amer: >60
GFR calc Af Amer: >60
Glucose, Bld: 135 — ABNORMAL HIGH
Potassium: 3.3 — ABNORMAL LOW
Potassium: 3.6
Potassium: 3.7
Sodium: 135
Sodium: 136

## 2011-07-05 LAB — CBC
HCT: 29.4 — ABNORMAL LOW
HCT: 35.9 — ABNORMAL LOW
Hemoglobin: 10.1 — ABNORMAL LOW
Hemoglobin: 12.2
MCHC: 34
MCHC: 34.3
MCV: 88
MCV: 88.9
RBC: 3.35 — ABNORMAL LOW
RBC: 3.39 — ABNORMAL LOW
RDW: 13.5
WBC: 8.6

## 2011-07-05 LAB — TYPE AND SCREEN

## 2011-07-09 ENCOUNTER — Ambulatory Visit: Payer: BC Managed Care – PPO | Admitting: Cardiovascular Disease

## 2011-07-11 ENCOUNTER — Ambulatory Visit: Payer: BC Managed Care – PPO | Admitting: Cardiovascular Disease

## 2011-07-12 ENCOUNTER — Encounter: Payer: Self-pay | Admitting: *Deleted

## 2011-07-12 ENCOUNTER — Encounter: Payer: Self-pay | Admitting: Cardiovascular Disease

## 2011-07-13 ENCOUNTER — Encounter: Payer: Self-pay | Admitting: Cardiovascular Disease

## 2011-07-13 ENCOUNTER — Ambulatory Visit (INDEPENDENT_AMBULATORY_CARE_PROVIDER_SITE_OTHER): Payer: BC Managed Care – PPO | Admitting: Cardiovascular Disease

## 2011-07-13 DIAGNOSIS — E785 Hyperlipidemia, unspecified: Secondary | ICD-10-CM

## 2011-07-13 DIAGNOSIS — R609 Edema, unspecified: Secondary | ICD-10-CM

## 2011-07-13 DIAGNOSIS — I10 Essential (primary) hypertension: Secondary | ICD-10-CM

## 2011-07-13 DIAGNOSIS — R9431 Abnormal electrocardiogram [ECG] [EKG]: Secondary | ICD-10-CM

## 2011-07-13 NOTE — Assessment & Plan Note (Signed)
Cholesterol is at goal.  Continue current dose of statin and diet Rx.  No myalgias or side effects.  F/U  LFT's in 6 months. Lab Results  Component Value Date   LDLCALC 111* 07/25/2009

## 2011-07-13 NOTE — Patient Instructions (Signed)
Your physician wants you to follow-up in: 6 MONTHS You will receive a reminder letter in the mail two months in advance. If you don't receive a letter, please call our office to schedule the follow-up appointment. 

## 2011-07-13 NOTE — Assessment & Plan Note (Signed)
Well controlled.  Continue current medications and low sodium Dash type diet.    

## 2011-07-13 NOTE — Assessment & Plan Note (Signed)
Stable and no change from  07/03/10 LVH from HTN  No CAD

## 2011-07-13 NOTE — Progress Notes (Signed)
Charlotte [name] is referred today for Dobutamine induced NSVT and ? syncope in the past in the setting of LBBB. The patient does not think she actually passed out. She was well until several months ago when she experienced an episode where she fell to the ground. She does not think she lost conscousness. She has never had another episode. She denies c/p. She underwent a Dobutamine stress test where she had NSVT. Subsequent cahteterization demonstrated no obstructive CAD and preserved LV function. She has had poorly controlled HTN but as her episode occured while on amlodipine in the setting of her LBBB, it was stopped. I reviewed her BP and weight records for the last 4 months. She is not well controlled with average systolics still 150-160. Weight is still hight around 274.  Long discussion about dietary indiscretion, low carbs and exercise. She use to do silver sneakers but poor balance limits her activity. Will put her on stronger diuretic and add different calcium blocker to see if we can improve her BP and edema. So long as she is obese, with varicosities and sednentary with salt indiscretion her edema will be hard to Rx  ROS: Denies fever, malais, weight loss, blurry vision, decreased visual acuity, cough, sputum, SOB, hemoptysis, pleuritic pain, palpitaitons, heartburn, abdominal pain, melena, lower extremity edema, claudication, or rash.  All other systems reviewed and negative  General: Affect appropriate Healthy:  appears stated age HEENT: normal Neck supple with no adenopathy JVP normal no bruits no thyromegaly Lungs clear with no wheezing and good diaphragmatic motion Heart:  S1/S2 no murmur,rub, gallop or click PMI normal Abdomen: benighn, BS positve, no tenderness, no AAA no bruit.  No HSM or HJR Distal pulses intact with no bruits Plus one bilateral  edema Neuro non-focal Skin warm and dry No muscular weakness   Current Outpatient Prescriptions  Medication Sig Dispense Refill  .  amoxicillin (AMOXIL) 500 MG capsule Take 500 mg by mouth as needed. For dental appt      . aspirin 325 MG tablet Take 325 mg by mouth daily.        . Calcium-Magnesium-Zinc 333-133-5 MG TABS Take by mouth. 4 po daily       . cetirizine (ZYRTEC) 10 MG tablet Take 1 tablet (10 mg total) by mouth daily.  30 tablet  11  . fluticasone (VERAMYST) 27.5 MCG/SPRAY nasal spray 2 sprays by Nasal route daily.        . furosemide (LASIX) 20 MG tablet Take 1 tablet (20 mg total) by mouth 2 (two) times daily.  180 tablet  3  . gabapentin (NEURONTIN) 300 MG capsule Take 300 mg by mouth 2 (two) times daily.       Marland Kitchen losartan (COZAAR) 100 MG tablet Take 1 tablet (100 mg total) by mouth daily.  90 tablet  3  . Misc Natural Products (GRAPE SEED XTRA PO) Take by mouth. 1 po bid       . Multiple Vitamin (MULTIVITAMIN) capsule Take 1 capsule by mouth daily.        . nebivolol (BYSTOLIC) 10 MG tablet Take 1 tablet (10 mg total) by mouth daily.  90 tablet  3  . Omega-3 Fatty Acids (FISH OIL) 1200 MG CAPS Take by mouth. 2 po bid       . omeprazole (PRILOSEC) 40 MG capsule Take 40 mg by mouth daily.        . potassium chloride (KLOR-CON) 10 MEQ CR tablet Take 1 tablet (10 mEq total) by mouth daily.  90 tablet  3  . pravastatin (PRAVACHOL) 20 MG tablet Take 20 mg by mouth daily.        . psyllium (METAMUCIL) 58.6 % powder Take 1 packet by mouth daily.        . verapamil (CALAN) 120 MG tablet Take 1 tablet (120 mg total) by mouth daily.  90 tablet  3  . vitamin B-12 (CYANOCOBALAMIN) 1000 MCG tablet Take 1,000 mcg by mouth daily.        Marland Kitchen VITAMIN D, CHOLECALCIFEROL, PO Take by mouth. 1 po daily         Allergies  Amlodipine besylate-valsartan  Electrocardiogram:  NSR 55 poor R wave progression and lateral T wave inversions old  Assessment and Plan

## 2011-07-13 NOTE — Assessment & Plan Note (Signed)
Improved continue current dose of diuretic

## 2011-11-01 ENCOUNTER — Other Ambulatory Visit: Payer: Self-pay | Admitting: Cardiovascular Disease

## 2011-11-01 MED ORDER — VERAPAMIL HCL 120 MG PO TABS: 120.0000 mg | ORAL_TABLET | Freq: Every day | ORAL | Status: DC

## 2011-11-01 MED ORDER — NEBIVOLOL HCL 10 MG PO TABS: 10.0000 mg | ORAL_TABLET | Freq: Every day | ORAL | Status: DC

## 2011-11-15 ENCOUNTER — Other Ambulatory Visit: Payer: Self-pay | Admitting: *Deleted

## 2011-11-19 ENCOUNTER — Ambulatory Visit (INDEPENDENT_AMBULATORY_CARE_PROVIDER_SITE_OTHER): Payer: BC Managed Care – PPO | Admitting: Family Medicine

## 2011-11-19 ENCOUNTER — Encounter: Payer: Self-pay | Admitting: Family Medicine

## 2011-11-19 VITALS — BP 145/70 | HR 95 | Ht 62.0 in | Wt 259.0 lb

## 2011-11-19 DIAGNOSIS — G629 Polyneuropathy, unspecified: Secondary | ICD-10-CM

## 2011-11-19 DIAGNOSIS — Z780 Asymptomatic menopausal state: Secondary | ICD-10-CM

## 2011-11-19 DIAGNOSIS — M8580 Other specified disorders of bone density and structure, unspecified site: Secondary | ICD-10-CM

## 2011-11-19 DIAGNOSIS — I10 Essential (primary) hypertension: Secondary | ICD-10-CM

## 2011-11-19 DIAGNOSIS — Z0000 Encounter for general adult medical examination without abnormal findings: Secondary | ICD-10-CM

## 2011-11-19 MED ORDER — OMEPRAZOLE 20 MG PO CPDR
20.0000 mg | DELAYED_RELEASE_CAPSULE | Freq: Two times a day (BID) | ORAL | Status: DC
Start: 2011-11-19 — End: 2013-05-07

## 2011-11-19 MED ORDER — GABAPENTIN 300 MG PO CAPS: 300.0000 mg | ORAL_CAPSULE | Freq: Two times a day (BID) | ORAL | Status: DC

## 2011-11-19 NOTE — Patient Instructions (Signed)
Start a regular exercise program and make sure you are eating a healthy diet Try to eat 4 servings of dairy a day or take a calcium supplement (500mg  twice a day). Your vaccines are up to date.  We will call you with your lab results once you go. If you don't here from [place] in about a week then please give [place] a call at [phone].

## 2011-11-19 NOTE — Progress Notes (Signed)
Subjective:    Charlotte [name] is a 76 y.o. female who presents for Medicare Annual/Subsequent preventive examination.  Preventive Screening-Counseling & Management  Tobacco History  Smoking status  . Former Smoker  . Quit date: 10/08/1970  Smokeless tobacco  . Not on file     Problems Prior to Visit 1.   Current Problems (verified) Patient Active Problem List  Diagnoses  . HYPERLIPIDEMIA, MILD  . CATARACTS  . HYPERTENSION, BENIGN SYSTEMIC  . VENTRICULAR TACHYCARDIA  . RHINITIS, ALLERGIC  . GASTROESOPHAGEAL REFLUX, NO ESOPHAGITIS  . IRRITABLE BOWEL SYNDROME  . OSTEOARTHRITIS  . OSTEOARTHROSIS NOS, LOWER LEG  . MEDIAL EPICONDYLITIS  . OSTEOPENIA  . SYNCOPE  . EDEMA  . DYSPNEA  . ELECTROCARDIOGRAM, ABNORMAL  . VITAMIN D DEFICIENCY  . ACTINIC KERATOSIS  . SEBORRHEIC KERATOSIS, INFLAMED    Medications Prior to Visit Current Outpatient Prescriptions on File Prior to Visit  Medication Sig Dispense Refill  . aspirin 325 MG tablet Take 325 mg by mouth daily.        . Calcium-Magnesium-Zinc 333-133-5 MG TABS Take by mouth. 4 po daily       . cetirizine (ZYRTEC) 10 MG tablet Take 1 tablet (10 mg total) by mouth daily.  30 tablet  11  . fluticasone (VERAMYST) 27.5 MCG/SPRAY nasal spray 2 sprays by Nasal route daily.        . furosemide (LASIX) 20 MG tablet Take 1 tablet (20 mg total) by mouth 2 (two) times daily.  180 tablet  3  . gabapentin (NEURONTIN) 300 MG capsule Take 300 mg by mouth 2 (two) times daily.       Marland Kitchen losartan (COZAAR) 100 MG tablet Take 1 tablet (100 mg total) by mouth daily.  90 tablet  3  . Multiple Vitamin (MULTIVITAMIN) capsule Take 1 capsule by mouth daily.        . nebivolol (BYSTOLIC) 10 MG tablet Take 1 tablet (10 mg total) by mouth daily.  90 tablet  3  . Omega-3 Fatty Acids (FISH OIL) 1200 MG CAPS Take by mouth. 2 po bid       . potassium chloride (KLOR-CON) 10 MEQ CR tablet Take 1 tablet (10 mEq total) by mouth daily.  90 tablet  3  . pravastatin  (PRAVACHOL) 20 MG tablet Take 20 mg by mouth daily.        . psyllium (METAMUCIL) 58.6 % powder Take 1 packet by mouth daily.        . verapamil (CALAN) 120 MG tablet Take 1 tablet (120 mg total) by mouth daily.  90 tablet  3  . vitamin B-12 (CYANOCOBALAMIN) 1000 MCG tablet Take 1,000 mcg by mouth daily.        Marland Kitchen VITAMIN D, CHOLECALCIFEROL, PO Take by mouth. 1 po daily         Current Medications (verified) Current Outpatient Prescriptions  Medication Sig Dispense Refill  . omeprazole (PRILOSEC) 20 MG capsule Take 20 mg by mouth daily.      Marland Kitchen aspirin 325 MG tablet Take 325 mg by mouth daily.        . Calcium-Magnesium-Zinc 333-133-5 MG TABS Take by mouth. 4 po daily       . cetirizine (ZYRTEC) 10 MG tablet Take 1 tablet (10 mg total) by mouth daily.  30 tablet  11  . fluticasone (VERAMYST) 27.5 MCG/SPRAY nasal spray 2 sprays by Nasal route daily.        . furosemide (LASIX) 20 MG tablet Take 1 tablet (  20 mg total) by mouth 2 (two) times daily.  180 tablet  3  . gabapentin (NEURONTIN) 300 MG capsule Take 300 mg by mouth 2 (two) times daily.       Marland Kitchen losartan (COZAAR) 100 MG tablet Take 1 tablet (100 mg total) by mouth daily.  90 tablet  3  . Multiple Vitamin (MULTIVITAMIN) capsule Take 1 capsule by mouth daily.        . nebivolol (BYSTOLIC) 10 MG tablet Take 1 tablet (10 mg total) by mouth daily.  90 tablet  3  . Omega-3 Fatty Acids (FISH OIL) 1200 MG CAPS Take by mouth. 2 po bid       . potassium chloride (KLOR-CON) 10 MEQ CR tablet Take 1 tablet (10 mEq total) by mouth daily.  90 tablet  3  . pravastatin (PRAVACHOL) 20 MG tablet Take 20 mg by mouth daily.        . psyllium (METAMUCIL) 58.6 % powder Take 1 packet by mouth daily.        . verapamil (CALAN) 120 MG tablet Take 1 tablet (120 mg total) by mouth daily.  90 tablet  3  . vitamin B-12 (CYANOCOBALAMIN) 1000 MCG tablet Take 1,000 mcg by mouth daily.        Marland Kitchen VITAMIN D, CHOLECALCIFEROL, PO Take by mouth. 1 po daily          Allergies  (verified) Amlodipine besylate-valsartan   PAST HISTORY  Family History Family History  Problem Relation Age of Onset  . Arrhythmia Mother     afib  . Alzheimer's disease Mother   . Colon cancer Father   . Heart attack Father 66  . Diabetes Sister   . Arrhythmia Sister     afib    Social History History  Substance Use Topics  . Smoking status: Former Smoker    Quit date: 10/08/1970  . Smokeless tobacco: Not on file  . Alcohol Use: No     Are there smokers in your home (other than you)? No 2 Risk Factors Current exercise habits: Home exercise routine includes Not much. think about going to silver sneakers. .  Dietary issues discussed: On Weight Watchers   Cardiac risk factors: advanced age (older than 29 for men, 27 for women), family history of premature cardiovascular disease and hypertension.  Depression Screen (Note: if answer to either of the following is "Yes", a more complete depression screening is indicated)   Over the past two weeks, have you felt down, depressed or hopeless? No   Over the past two weeks, have you felt little interest or pleasure in doing things? No  Have you lost interest or pleasure in daily life? No  Do you often feel hopeless? No  Do you cry easily over simple problems? No  Activities of Daily Living In your present state of health, do you have any difficulty performing the following activities?:  Driving? No Managing money?  No Feeding yourself? No Getting from bed to chair? No Climbing a flight of stairs? No Preparing food and eating?: No Bathing or showering? No Getting dressed: No Getting to the toilet? No Using the toilet:No Moving around from place to place: No In the past year have you fallen or had a near fall?:No   Are you sexually active?  No  Do you have more than one partner?  No  Hearing Difficulties: No Do you often ask people to speak up or repeat themselves? No Do you experience ringing or noises in your  ears? No Do you have difficulty understanding soft or whispered voices? No   Do you feel that you have a problem with memory? No  Do you often misplace items? No  Do you feel safe at home?  Yes  Cognitive Testing  Alert? Yes  Normal Appearance?Yes  Oriented to person? Yes  Place? Yes   Time? Yes  Recall of three objects?  Yes  Can perform simple calculations? Yes  Displays appropriate judgment?Yes  Can read the correct time from a watch face?Yes   Advanced Directives have been discussed with the patient? Yes  List the Names of Other Physician/Practitioners you currently use: 1.    Indicate any recent Medical Services you may have received from other than [facility] providers in the past year (date may be approximate).  Immunization History  Administered Date(s) Administered  . Influenza Whole 08/07/2006, 08/05/2007, 07/08/2009, 07/25/2009, 06/30/2010  . Pneumococcal Polysaccharide 10/08/2002  . Td 07/25/2009  . Zoster 09/21/2009    Screening Tests Health Maintenance  Topic Date Due  . Influenza Vaccine  07/08/2012  . Colonoscopy  10/08/2017  . Tetanus/tdap  07/26/2019  . Pneumococcal Polysaccharide Vaccine Age 58 And Over  Completed  . Zostavax  Completed    All answers were reviewed with the patient and necessary referrals were made:  Moroni Nester, MD   11/19/2011   History reviewed: allergies, current medications, past family history, past medical history, past social history, past surgical history and problem list  Review of Systems A comprehensive review of systems was negative.    Objective:     Vision by Snellen chart: right eye:20/25, left eye:20/25  Body mass index is 47.37 kg/(m^2). BP 145/70  Pulse 95  Ht 5\' 2"  (1.575 m)  Wt 259 lb (117.482 kg)  BMI 47.37 kg/m2  BP 145/70  Pulse 95  Ht 5\' 2"  (1.575 m)  Wt 259 lb (117.482 kg)  BMI 47.37 kg/m2  General Appearance:    Alert, cooperative, no distress, appears stated age  Head:    Normocephalic,  without obvious abnormality, atraumatic  Eyes:    PERRL, conjunctiva/corneas clear, EOM's intact, fundi    benign, both eyes  Ears:    Normal TM's and external ear canals, both ears  Nose:   Nares normal, septum midline, mucosa normal, no drainage    or sinus tenderness  Throat:   Lips, mucosa, and tongue normal; teeth and gums normal  Neck:   Supple, symmetrical, trachea midline, no adenopathy;    thyroid:  no enlargement/tenderness/nodules; no carotid   bruit or JVD  Back:     Symmetric, no curvature, ROM normal, no CVA tenderness  Lungs:     Clear to auscultation bilaterally, respirations unlabored  Chest Wall:    No tenderness or deformity   Heart:    Regular rate and rhythm, S1 and S2 normal, no murmur, rub   or gallop  Breast Exam:    No tenderness, masses, or nipple abnormality  Abdomen:     Soft, non-tender, bowel sounds active all four quadrants,    no masses, no organomegaly  Genitalia:    Normal female without lesion, discharge or tenderness  Rectal:    Normal tone, normal prostate, no masses or tenderness;   guaiac negative stool  Extremities:   Extremities normal, atraumatic, no cyanosis or edema  Pulses:   2+ and symmetric all extremities  Skin:   Skin color, texture, turgor normal, no rashes or lesions  Lymph nodes:   Cervical, supraclavicular, and axillary nodes  normal  Neurologic:   CNII-XII intact, normal strength, sensation and reflexes    throughout       Assessment:     Annual Wellness Exam.      Plan:     During the course of the visit the patient was educated and counseled about appropriate screening and preventive services including:    Pneumococcal vaccine   Bone densitometry screening  Due for screening labs for her hypertnsipon. WEll overdue.   DEXA   Diet review for nutrition referral? Yes ____  Not Indicated _x___   Patient Instructions (the written plan) was given to the patient.  Medicare Attestation I have personally reviewed: The  patient's medical and social history Their use of alcohol, tobacco or illicit drugs Their current medications and supplements The patient's functional ability including ADLs,fall risks, home safety risks, cognitive, and hearing and visual impairment Diet and physical activities Evidence for depression or mood disorders  The patient's weight, height, BMI, and visual acuity have been recorded in the chart.  I have made referrals, counseling, and provided education to the patient based on review of the above and I have provided the patient with a written personalized care plan for preventive services.     Almarosa Bohac, MD   11/19/2011    Hypertension-her blood pressure is up a little bit today. I will back her blood pressure was well-controlled in the spring. I also noticed that her verapamil is regular relief in his dose to once a day. I wonder if this could be a mistake. I will try to get in touch with Dr. Ricki Miller office who is her cardiologist, and see if she may actually have the wrong prescription. I suspect she supposed to be on sustained released product and that might be the difference between her current blood pressure and last spring.

## 2011-11-21 ENCOUNTER — Telehealth: Payer: Self-pay | Admitting: Cardiovascular Disease

## 2011-11-21 NOTE — Telephone Encounter (Signed)
APPEARS VERAPAMIL AND BYSTOLIC WERE OKAYED  FROM LAST  FILL DATE OF 11/01/11 AND PT WAS NOTIFIED  ON COST  PER  [facility]. CONFUSING MESSAGE./CY

## 2011-11-21 NOTE — Telephone Encounter (Signed)
New problem:  Per Sonya calling the request for medication has been approval.

## 2011-11-26 ENCOUNTER — Telehealth: Payer: Self-pay | Admitting: *Deleted

## 2011-11-26 ENCOUNTER — Other Ambulatory Visit: Payer: Self-pay | Admitting: Family Medicine

## 2011-11-26 DIAGNOSIS — Z1231 Encounter for screening mammogram for malignant neoplasm of breast: Secondary | ICD-10-CM

## 2011-11-26 MED ORDER — VERAPAMIL HCL ER 120 MG PO CP24: 120.0000 mg | ORAL_CAPSULE | Freq: Every day | ORAL | Status: DC

## 2011-11-26 NOTE — Telephone Encounter (Signed)
Since she has verapamil SR she can take this twice a day until she runs out. I went ahead and then a new prescription for the extended release 24 hour, once a day version to mail order.

## 2011-11-26 NOTE — Telephone Encounter (Signed)
Pt came into the office and wanted you to know that she has always been on verapamil extended releases and for some reason it got switched over when she went over to mail order pharm to verapamil SR

## 2011-11-27 ENCOUNTER — Other Ambulatory Visit: Payer: Self-pay | Admitting: Family Medicine

## 2011-11-27 LAB — COMPLETE METABOLIC PANEL WITH GFR
ALT: 16 U/L (ref 0–35)
AST: 19 U/L (ref 0–37)
Creat: 0.7 mg/dL (ref 0.50–1.10)
GFR, Est African American: >89 mL/min
Total Bilirubin: 0.6 mg/dL (ref 0.3–1.2)

## 2011-11-27 LAB — LIPID PANEL
HDL: 50 mg/dL (ref >39)
LDL Cholesterol: 117 mg/dL — ABNORMAL HIGH (ref 0–99)
Triglycerides: 75 mg/dL (ref <150)

## 2011-11-27 MED ORDER — PRAVASTATIN SODIUM 40 MG PO TABS: 40.0000 mg | ORAL_TABLET | Freq: Every day | ORAL | Status: DC

## 2011-11-27 NOTE — Telephone Encounter (Signed)
Pt informed

## 2011-12-04 ENCOUNTER — Ambulatory Visit
Admission: RE | Admit: 2011-12-04 | Discharge: 2011-12-04 | Disposition: A | Discharge status: Home / Self Care | Payer: Medicare Other | Source: Ambulatory Visit | Attending: Family Medicine | Admitting: Family Medicine

## 2011-12-04 DIAGNOSIS — Z780 Asymptomatic menopausal state: Secondary | ICD-10-CM

## 2011-12-04 IMAGING — DX DG DXA BONE DENSITY STUDY HL7
2 series · 2 of 2 positions shown · non-contrast
Comparison: When compared to most recent prior study of [DATE],
there has been a 2% decrease in bone mineral density in the left
femur, which is not felt to be statistically significant.

CLINICAL DATA: History of hysterectomy and oophorectomy, takes
calcium but no bone [HOSPITAL]

[view not recorded (1 of 2)]
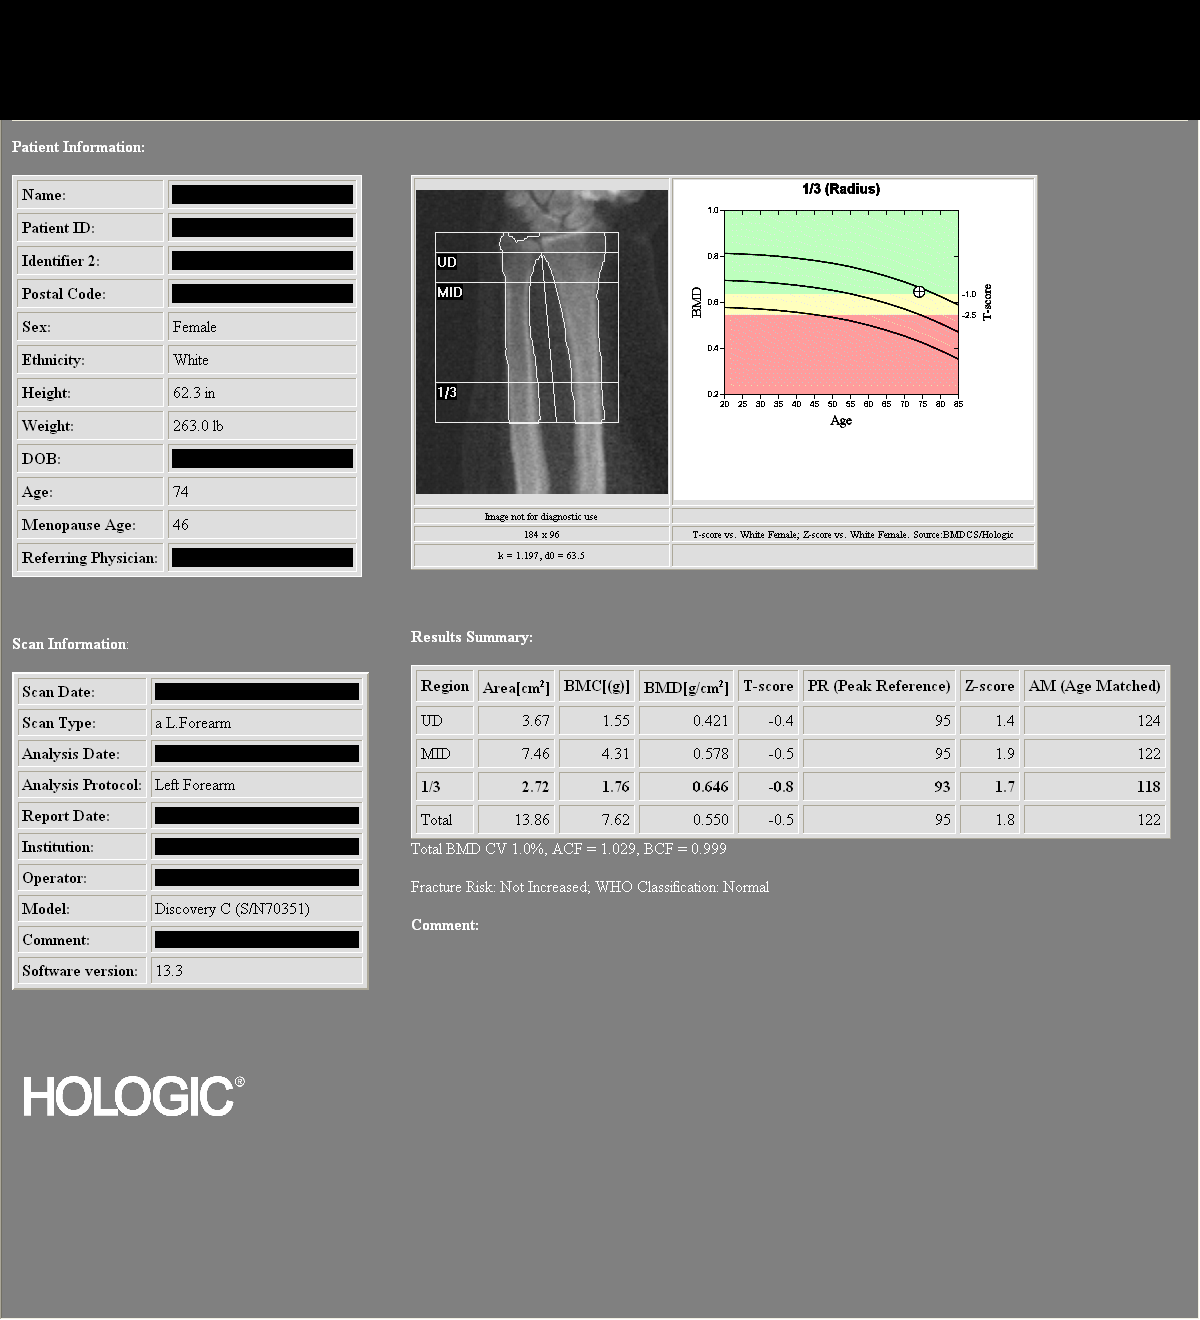

[view not recorded (2 of 2)]
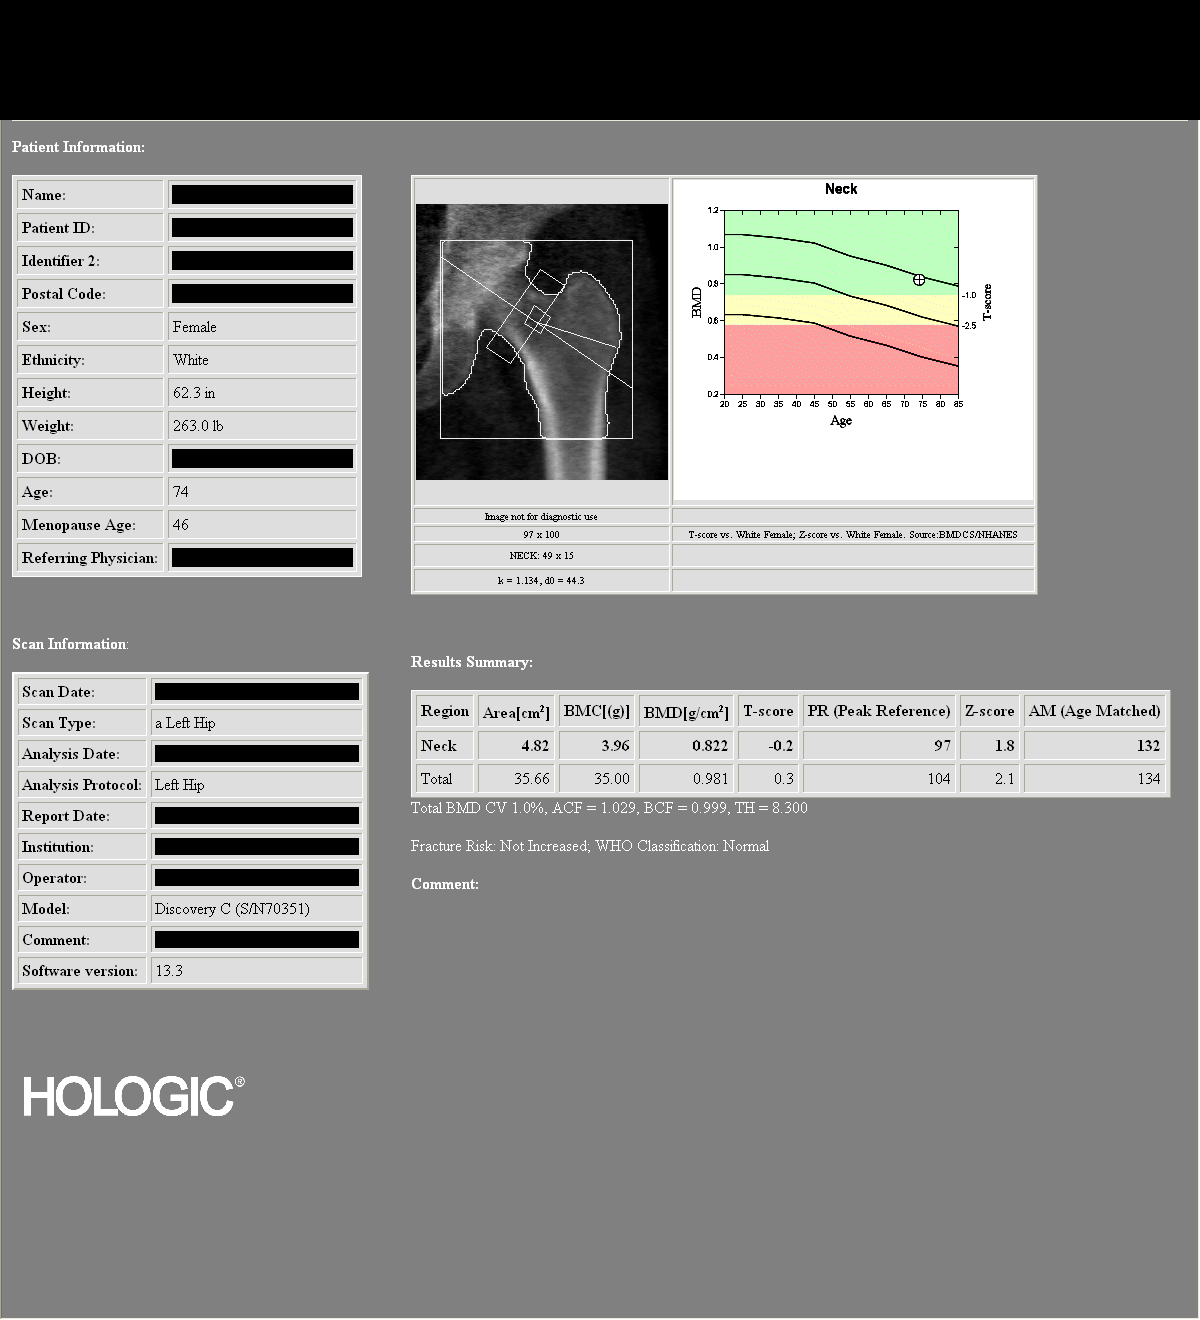

[2 of 2 positions shown; findings below may reference images not displayed]

DUAL X-RAY ABSORPTIOMETRY (DXA) FOR BONE MINERAL DENSITY

LEFT FEMUR NECK

Bone Mineral Density (BMD):             0.822 g/cm2
Young Adult T Score:                           -0.2
Z Score:

 LEFT FOREARM ([DATE] RADIUS)

Bone Mineral Density (BMD):                     0.646 g/cm2
Young Adult T Score:                                  -0.8
Z Score:

ASSESSMENT:  Patient's diagnostic category is NORMAL by WHO
Criteria.

FRACTURE RISK: NOT INCREASED

FRAX: World Health Organization FRAX assessment of absolute
fracture risk is not calculated for this patient because the
patient has normal bone density.
When
compared to baseline examination of [DATE], there has been a
10.5% decrease, which is statistically significant.

RECOMMENDATIONS:

Effective therapies are available in the form of bisphosphonates,
selective estrogen receptor modulators, biologic agents, and
hormone replacement therapy (for women).  All patients should
ensure an adequate intake of dietary calcium (1200mg daily) and
vitamin D (800 PAULUS N) unless contraindicated.

All treatment decisions require clinical judgement and
consideration of individual patient factors, including patient
preferences, co-morbidities, previous drug use, risk factors not
captured in the FRAX model (e.g., frailty, falls, vitamin D
deficiency, increased bone turnover, interval significant decline
in bone density) and possible under-or over-estimation of fracture
risk by FRAX.

The National Osteoporosis Foundation recommends that FDA-approved
medical therapies be considered in postmenopausal women and mean
age 50 or older with a:

      1)     Hip or vertebral (clinical or morphometric) fracture.

2)    T-score of -2.5 or lower at the spine or hip.
3)    Ten-year fracture probability by FRAX of 3% or greater for
hip fracture or 20% or greater for major osteoporotic fracture.
FOLLOW-UP:

People with diagnosed cases of osteoporosis or at high risk for
fracture should have regular bone mineral density tests.  For
patients eligible for Medicare, routine testing is allowed once
every 2 years.  The testing frequency can be increased to one year
for patients who have rapidly progressing disease, those who are
receiving or discontinuing medical therapy to restore bone mass, or
have additional risk factors.

World Health Organization (WHO) Criteria:

Normal: T scores from +1.0 to -1.0
Low Bone Mass (Osteopenia): T scores between -1.0 and -2.5
Osteoporosis: T scores -2.5 and below

Comparison to Reference Population:

T score is the key measure used in the diagnosis of osteoporosis
and relative risk determination for fracture.  It provides a value
for bone mass relative to the mean bone mass of a young adult
reference population expressed in terms of standard deviation (SD).

Z score is the age-matched score showing the patient's values
compared to a population matched for age, sex, and race.  This is
also expressed in terms of standard deviation.  The patient may
have values that compare favorably to the age-matched values and
still be at increased risk for fracture.

## 2011-12-11 ENCOUNTER — Ambulatory Visit
Admission: RE | Admit: 2011-12-11 | Discharge: 2011-12-11 | Disposition: A | Discharge status: Home / Self Care | Payer: Medicare Other | Source: Ambulatory Visit | Attending: Family Medicine | Admitting: Family Medicine

## 2011-12-11 DIAGNOSIS — Z1231 Encounter for screening mammogram for malignant neoplasm of breast: Secondary | ICD-10-CM

## 2012-02-15 ENCOUNTER — Ambulatory Visit (INDEPENDENT_AMBULATORY_CARE_PROVIDER_SITE_OTHER): Payer: Medicare Other | Admitting: Cardiovascular Disease

## 2012-02-15 ENCOUNTER — Encounter: Payer: Self-pay | Admitting: Cardiovascular Disease

## 2012-02-15 VITALS — BP 144/88 | HR 65 | Wt 270.0 lb

## 2012-02-15 DIAGNOSIS — I10 Essential (primary) hypertension: Secondary | ICD-10-CM

## 2012-02-15 DIAGNOSIS — E785 Hyperlipidemia, unspecified: Secondary | ICD-10-CM

## 2012-02-15 DIAGNOSIS — I472 Ventricular tachycardia: Secondary | ICD-10-CM

## 2012-02-15 NOTE — Progress Notes (Signed)
Patient ID: Charlotte [name], female   DOB: 1936-02-27, 76 y.o.   MRN: [id number] Charlotte [name] is referred today for Dobutamine induced NSVT and ? syncope in the past in the setting of LBBB. The patient does not think she actually passed out. She was well until several months ago when she experienced an episode where she fell to the ground. She does not think she lost conscousness. She has never had another episode. She denies c/p. She underwent a Dobutamine stress test where she had NSVT. Subsequent cahteterization demonstrated no obstructive CAD and preserved LV function. She has had poorly controlled HTN but as her episode occured while on amlodipine in the setting of her LBBB, it was stopped. I reviewed her BP and weight records for the last 4 months. She is not well controlled with average systolics still 150-160. Weight is still hight around 274.  Long discussion about dietary indiscretion, low carbs and exercise. She use to do silver sneakers but poor balance limits her activity. Will put her on stronger diuretic and add different calcium blocker to see if we can improve her BP and edema. So long as she is obese, with varicosities and sednentary with salt indiscretion her edema will be hard to Rx  Discussed taking losartan in am.  Takes SBE prophylaxis for knee replacements not heart   ROS: Denies fever, malais, weight loss, blurry vision, decreased visual acuity, cough, sputum, SOB, hemoptysis, pleuritic pain, palpitaitons, heartburn, abdominal pain, melena, lower extremity edema, claudication, or rash.  All other systems reviewed and negative  General: Affect appropriate Obese white female HEENT: normal Neck supple with no adenopathy JVP normal no bruits no thyromegaly Lungs clear with no wheezing and good diaphragmatic motion Heart:  S1/S2 no murmur, no rub, gallop or click PMI normal Abdomen: benighn, BS positve, no tenderness, no AAA no bruit.  No HSM or HJR Distal pulses intact with no  bruits No edema Neuro non-focal Skin warm and dry No muscular weakness   Current Outpatient Prescriptions  Medication Sig Dispense Refill  . amoxicillin (AMOXIL) 500 MG capsule Take 500 mg by mouth. PRN DENTAL      . aspirin 325 MG tablet Take 325 mg by mouth daily.        . Calcium-Magnesium-Zinc 333-133-5 MG TABS Take by mouth. 4 po daily       . fluticasone (FLONASE) 50 MCG/ACT nasal spray PRN      . fluticasone (VERAMYST) 27.5 MCG/SPRAY nasal spray 2 sprays by Nasal route daily.        . furosemide (LASIX) 20 MG tablet Take 1 tablet (20 mg total) by mouth 2 (two) times daily.  180 tablet  3  . gabapentin (NEURONTIN) 300 MG capsule Take 300 mg by mouth 4 (four) times daily.      Marland Kitchen losartan (COZAAR) 100 MG tablet Take 1 tablet (100 mg total) by mouth daily.  90 tablet  3  . Multiple Vitamin (MULTIVITAMIN) capsule Take 1 capsule by mouth daily.        . nebivolol (BYSTOLIC) 10 MG tablet Take 1 tablet (10 mg total) by mouth daily.  90 tablet  3  . Omega-3 Fatty Acids (FISH OIL) 1200 MG CAPS Take by mouth. 2 po bid       . omeprazole (PRILOSEC) 20 MG capsule Take 1 capsule (20 mg total) by mouth 2 (two) times daily.  180 capsule  1  . polyvinyl alcohol-povidone (REFRESH) 1.4-0.6 % ophthalmic solution 1-2 drops as needed.      Marland Kitchen  potassium chloride (KLOR-CON) 10 MEQ CR tablet Take 1 tablet (10 mEq total) by mouth daily.  90 tablet  3  . pravastatin (PRAVACHOL) 20 MG tablet Take 20 mg by mouth daily.      . psyllium (METAMUCIL) 58.6 % powder Take 1 packet by mouth daily.        . verapamil (VERELAN PM) 120 MG 24 hr capsule Take 1 capsule (120 mg total) by mouth at bedtime.  90 capsule  1  . vitamin B-12 (CYANOCOBALAMIN) 1000 MCG tablet Take 1,000 mcg by mouth daily.        Marland Kitchen VITAMIN D, CHOLECALCIFEROL, PO Take by mouth. 1 po daily       . DISCONTD: gabapentin (NEURONTIN) 300 MG capsule Take 1 capsule (300 mg total) by mouth 2 (two) times daily.  180 capsule  1  . cetirizine (ZYRTEC) 10 MG  tablet Take 1 tablet (10 mg total) by mouth daily.  30 tablet  11    Allergies  Amlodipine besylate-valsartan  Electrocardiogram:  Assessment and Plan

## 2012-02-15 NOTE — Patient Instructions (Signed)
Your physician wants you to follow-up in: YEAR WITH DR NISHAN  You will receive a reminder letter in the mail two months in advance. If you don't receive a letter, please call our office to schedule the follow-up appointment.  Your physician recommends that you continue on your current medications as directed. Please refer to the Current Medication list given to you today. 

## 2012-02-15 NOTE — Assessment & Plan Note (Signed)
Start taking losartan in am and not all her meds at night

## 2012-02-15 NOTE — Assessment & Plan Note (Signed)
Cholesterol is at goal.  Continue current dose of statin and diet Rx.  No myalgias or side effects.  F/U  LFT's in 6 months. Lab Results  Component Value Date   LDLCALC 117* 11/26/2011             

## 2012-02-15 NOTE — Assessment & Plan Note (Signed)
In setting of dobutamine.  LBBB no CAD Stable with no recurrent syncope

## 2012-02-18 ENCOUNTER — Other Ambulatory Visit: Payer: Self-pay | Admitting: Cardiovascular Disease

## 2012-02-18 MED ORDER — POTASSIUM CHLORIDE ER 10 MEQ PO TBCR: 10.0000 meq | EXTENDED_RELEASE_TABLET | Freq: Two times a day (BID) | ORAL | Status: DC

## 2012-02-21 ENCOUNTER — Ambulatory Visit: Payer: Medicare Other | Attending: Psychiatry | Admitting: Physical Therapy

## 2012-02-21 DIAGNOSIS — R293 Abnormal posture: Secondary | ICD-10-CM | POA: Insufficient documentation

## 2012-02-21 DIAGNOSIS — M545 Low back pain, unspecified: Secondary | ICD-10-CM | POA: Insufficient documentation

## 2012-02-21 DIAGNOSIS — M25559 Pain in unspecified hip: Secondary | ICD-10-CM | POA: Insufficient documentation

## 2012-02-21 DIAGNOSIS — IMO0001 Reserved for inherently not codable concepts without codable children: Secondary | ICD-10-CM | POA: Insufficient documentation

## 2012-02-21 DIAGNOSIS — M6281 Muscle weakness (generalized): Secondary | ICD-10-CM | POA: Insufficient documentation

## 2012-02-25 ENCOUNTER — Ambulatory Visit: Payer: Medicare Other | Admitting: Physical Therapy

## 2012-02-25 ENCOUNTER — Telehealth: Payer: Self-pay | Admitting: Cardiovascular Disease

## 2012-02-25 MED ORDER — LOSARTAN POTASSIUM 100 MG PO TABS: 100.0000 mg | ORAL_TABLET | Freq: Every day | ORAL | Status: DC

## 2012-02-25 MED ORDER — POTASSIUM CHLORIDE ER 10 MEQ PO TBCR: 10.0000 meq | EXTENDED_RELEASE_TABLET | Freq: Every day | ORAL | Status: DC

## 2012-02-25 NOTE — Telephone Encounter (Signed)
LOSARTIN 100MG  REFILLED NEEDED [facility] FOR 90 DAY SUPPLY, PT ALSO HAS QUESTION RE KLOR CON, HER MED LIST SHOWS K-DUR TO TAKE TWO TIMES A DAY, HER MED LIST WE GAVE HER 5-10 SHOWS THE KLOR CON TWICE A DAY BUT SHE WAS ONLY TAKING IT ONCE A DAY, ALSO REQUESTING SAMPLES OF THE LORSARTIN ONLY HAS 4-5 DAYS LEFT, AND CAN THEY BE MAILED?  PLS CALL  [phone]

## 2012-02-25 NOTE — Telephone Encounter (Signed)
SPOKE WITH  PT RE MESSAGE   PT AWARE LOSARTAN IS GEN   SAMPLES ARE NOT AVAILABLE  REORDERED MED VIA EPIC TO [facility]  ALSO PT ONLY TAKING  KCL  10 MEQ DAILY  AFTER REVIEWING  OFFICE PT WAS ONLY TAKING  1 TAB  INSTTRUCTED TO CONT TO ONLY TAKE 1  NO DOCUMENTATION NOTED FOR INCREASE PT VERBALIZED UNDERSTANDING .Zack Seal

## 2012-02-28 ENCOUNTER — Ambulatory Visit: Payer: Medicare Other | Admitting: Physical Therapy

## 2012-03-04 ENCOUNTER — Ambulatory Visit: Payer: Medicare Other | Admitting: Physical Therapy

## 2012-03-07 ENCOUNTER — Ambulatory Visit: Payer: Medicare Other | Admitting: Physical Therapy

## 2012-03-10 ENCOUNTER — Ambulatory Visit: Payer: Medicare Other | Attending: Psychiatry | Admitting: Physical Therapy

## 2012-03-10 DIAGNOSIS — M25559 Pain in unspecified hip: Secondary | ICD-10-CM | POA: Insufficient documentation

## 2012-03-10 DIAGNOSIS — M545 Low back pain, unspecified: Secondary | ICD-10-CM | POA: Insufficient documentation

## 2012-03-10 DIAGNOSIS — IMO0001 Reserved for inherently not codable concepts without codable children: Secondary | ICD-10-CM | POA: Insufficient documentation

## 2012-03-10 DIAGNOSIS — M6281 Muscle weakness (generalized): Secondary | ICD-10-CM | POA: Insufficient documentation

## 2012-03-10 DIAGNOSIS — R293 Abnormal posture: Secondary | ICD-10-CM | POA: Insufficient documentation

## 2012-03-13 ENCOUNTER — Encounter: Payer: Medicare Other | Admitting: Physical Therapy

## 2012-03-14 ENCOUNTER — Ambulatory Visit: Payer: Medicare Other | Admitting: Physical Therapy

## 2012-03-17 ENCOUNTER — Ambulatory Visit: Payer: Medicare Other | Admitting: Physical Therapy

## 2012-03-19 ENCOUNTER — Other Ambulatory Visit: Payer: Self-pay | Admitting: Family Medicine

## 2012-03-20 ENCOUNTER — Ambulatory Visit: Payer: Medicare Other | Admitting: Physical Therapy

## 2012-04-04 ENCOUNTER — Other Ambulatory Visit: Payer: Self-pay | Admitting: Cardiovascular Disease

## 2012-04-04 MED ORDER — POTASSIUM CHLORIDE ER 10 MEQ PO TBCR: 10.0000 meq | EXTENDED_RELEASE_TABLET | Freq: Every day | ORAL | Status: DC

## 2012-06-23 ENCOUNTER — Telehealth: Payer: Self-pay | Admitting: Cardiovascular Disease

## 2012-06-23 NOTE — Telephone Encounter (Signed)
Pt needs refill of klor-con 90 day supply at [facility]

## 2012-06-24 MED ORDER — POTASSIUM CHLORIDE ER 10 MEQ PO TBCR: 10.0000 meq | EXTENDED_RELEASE_TABLET | Freq: Every day | ORAL | Status: DC

## 2012-07-03 ENCOUNTER — Other Ambulatory Visit: Payer: Self-pay | Admitting: *Deleted

## 2012-07-03 MED ORDER — POTASSIUM CHLORIDE ER 10 MEQ PO TBCR: 10.0000 meq | EXTENDED_RELEASE_TABLET | Freq: Every day | ORAL | Status: DC

## 2012-07-03 MED ORDER — VERAPAMIL HCL ER 120 MG PO CP24: 120.0000 mg | ORAL_CAPSULE | Freq: Every day | ORAL | Status: DC

## 2012-07-03 NOTE — Telephone Encounter (Signed)
Refilled potassium and verapamil

## 2012-07-08 ENCOUNTER — Telehealth: Payer: Self-pay | Admitting: Cardiovascular Disease

## 2012-07-08 MED ORDER — VERAPAMIL HCL ER 120 MG PO CP24: 120.0000 mg | ORAL_CAPSULE | Freq: Every day | ORAL | Status: DC

## 2012-07-08 NOTE — Telephone Encounter (Signed)
GENERIC ONLY

## 2012-07-11 ENCOUNTER — Other Ambulatory Visit: Payer: Self-pay | Admitting: *Deleted

## 2012-07-11 MED ORDER — FUROSEMIDE 20 MG PO TABS: 20.0000 mg | ORAL_TABLET | Freq: Two times a day (BID) | ORAL | Status: DC

## 2012-07-11 NOTE — Telephone Encounter (Signed)
Fax Received. Refill Completed. Charlotte [name] (R.M.A)   

## 2012-09-18 ENCOUNTER — Telehealth: Payer: Self-pay | Admitting: Cardiovascular Disease

## 2012-09-18 MED ORDER — POTASSIUM CHLORIDE ER 10 MEQ PO TBCR: 10.0000 meq | EXTENDED_RELEASE_TABLET | Freq: Every day | ORAL | Status: DC

## 2012-09-18 NOTE — Telephone Encounter (Signed)
New Problem:    Patient called in needing a refill of her potassium chloride (K-DUR) 10 MEQ tablet filled with [facility].  Please call back if you have any questions.

## 2012-11-25 ENCOUNTER — Other Ambulatory Visit: Payer: Self-pay | Admitting: Cardiovascular Disease

## 2012-11-25 MED ORDER — POTASSIUM CHLORIDE ER 10 MEQ PO TBCR: 10.0000 meq | EXTENDED_RELEASE_TABLET | Freq: Every day | ORAL | Status: DC

## 2012-11-25 MED ORDER — VERAPAMIL HCL ER 120 MG PO CP24: 120.0000 mg | ORAL_CAPSULE | Freq: Every day | ORAL | Status: DC

## 2012-11-25 MED ORDER — LOSARTAN POTASSIUM 100 MG PO TABS: 100.0000 mg | ORAL_TABLET | Freq: Every day | ORAL | Status: DC

## 2012-11-25 MED ORDER — NEBIVOLOL HCL 10 MG PO TABS: 10.0000 mg | ORAL_TABLET | Freq: Every day | ORAL | Status: DC

## 2012-11-25 NOTE — Telephone Encounter (Signed)
Per refill line  Pt needs these 4 meds refilled to [facility]

## 2013-01-27 ENCOUNTER — Other Ambulatory Visit: Payer: Self-pay | Admitting: *Deleted

## 2013-01-27 MED ORDER — POTASSIUM CHLORIDE ER 10 MEQ PO TBCR: 10.0000 meq | EXTENDED_RELEASE_TABLET | Freq: Every day | ORAL | Status: DC

## 2013-02-24 ENCOUNTER — Encounter: Payer: Self-pay | Admitting: Cardiovascular Disease

## 2013-02-24 ENCOUNTER — Ambulatory Visit (INDEPENDENT_AMBULATORY_CARE_PROVIDER_SITE_OTHER): Payer: No Typology Code available for payment source | Admitting: Cardiovascular Disease

## 2013-02-24 VITALS — BP 145/82 | HR 87 | Ht 62.0 in | Wt 292.4 lb

## 2013-02-24 DIAGNOSIS — I4891 Unspecified atrial fibrillation: Secondary | ICD-10-CM

## 2013-02-24 LAB — CBC WITH DIFFERENTIAL/PLATELET
Basophils Absolute: 0 10*3/uL (ref 0.0–0.1)
Eosinophils Absolute: 0.1 10*3/uL (ref 0.0–0.7)
HCT: 40.3 % (ref 36.0–46.0)
Hemoglobin: 13.7 g/dL (ref 12.0–15.0)
Lymphs Abs: 2.1 10*3/uL (ref 0.7–4.0)
MCHC: 34.1 g/dL (ref 30.0–36.0)
MCV: 89.3 fl (ref 78.0–100.0)
Monocytes Absolute: 0.5 10*3/uL (ref 0.1–1.0)
Monocytes Relative: 7.8 % (ref 3.0–12.0)
Neutro Abs: 4.1 10*3/uL (ref 1.4–7.7)
Platelets: 209 10*3/uL (ref 150.0–400.0)
RDW: 14 % (ref 11.5–14.6)

## 2013-02-24 LAB — T4, FREE: Free T4: 0.86 ng/dL (ref 0.60–1.60)

## 2013-02-24 LAB — BASIC METABOLIC PANEL
CO2: 26 mEq/L (ref 19–32)
Calcium: 9.7 mg/dL (ref 8.4–10.5)
Creatinine, Ser: 0.8 mg/dL (ref 0.4–1.2)
GFR: 75.33 mL/min (ref >60.00)
Sodium: 139 mEq/L (ref 135–145)

## 2013-02-24 MED ORDER — VERAPAMIL HCL ER 240 MG PO CP24: 240.0000 mg | ORAL_CAPSULE | Freq: Every day | ORAL | Status: DC

## 2013-02-24 MED ORDER — POTASSIUM CHLORIDE ER 10 MEQ PO TBCR: 10.0000 meq | EXTENDED_RELEASE_TABLET | Freq: Two times a day (BID) | ORAL | Status: DC

## 2013-02-24 MED ORDER — RIVAROXABAN 20 MG PO TABS: 20.0000 mg | ORAL_TABLET | Freq: Every day | ORAL | Status: DC

## 2013-02-24 MED ORDER — FUROSEMIDE 20 MG PO TABS: 20.0000 mg | ORAL_TABLET | Freq: Two times a day (BID) | ORAL | Status: DC

## 2013-02-24 NOTE — Patient Instructions (Addendum)
Your physician recommends that you schedule a follow-up appointment in: 3-4 WEEKS WITH DR [facility] Your physician has recommended you make the following change in your medication: INCREASE VERAPAMIL TO 240 MG  EVERY DAY  INCREASE POTASSIUM TO 10  MEQ TWICE DAILY  INCREASE FUROSEMIDE TO  20 MG   1 TAB  EVERY AM AND 2 TABS  EVERY PM AND START XARELTO 20 MG  WITH  EVENING MEAL  Your physician has requested that you have an echocardiogram. Echocardiography is a painless test that uses sound waves to create images of your heart. It provides your doctor with information about the size and shape of your heart and how well your heart's chambers and valves are working. This procedure takes approximately one hour. There are no restrictions for this procedure.  Your physician recommends that you return for lab work in:  LAB TODAY  BMET BNP  CBC  TSH  FREE T4  DX  AFIB

## 2013-02-24 NOTE — Assessment & Plan Note (Signed)
Cholesterol is at goal.  Continue current dose of statin and diet Rx.  No myalgias or side effects.  F/U  LFT's in 6 months. Lab Results  Component Value Date   LDLCALC 117* 11/26/2011

## 2013-02-24 NOTE — Assessment & Plan Note (Addendum)
Related to obesity and new onset afib.  Check echo for LV function  BNP  Increase lasix

## 2013-02-24 NOTE — Assessment & Plan Note (Signed)
Well controlled.  Continue current medications and low sodium Dash type diet.    

## 2013-02-24 NOTE — Progress Notes (Signed)
Patient ID: Charlotte [name], female   DOB: [birth year], 77 y.o.   MRN: [id number] Ms. Gail is referred today for Dobutamine induced NSVT and ? syncope in the past in the setting of LBBB. The patient does not think she actually passed out. She was well until several months ago when she experienced an episode where she fell to the ground. She does not think she lost conscousness. She has never had another episode. She denies c/p. She underwent a Dobutamine stress test where she had NSVT. Subsequent cahteterization demonstrated no obstructive CAD and preserved LV function. She has had poorly controlled HTN but as her episode occured while on amlodipine in the setting of her LBBB, it was stopped. I reviewed her BP and weight records for the last 4 months. She is not well controlled with average systolics still 150-160. Weight is still hight around 274.   Long discussion about dietary indiscretion, low carbs and exercise. She use to do silver sneakers but poor balance limits her activity Last visit calcium blocker changed to ACE  and diuretic increased  So long as she is obese, with varicosities and sednentary with salt indiscretion her edema will be hard to Rx  Unfortunately today she was also noted to be in afib. No contraindications to anticoagulation. She takes her BP at home and thinks it showed irregularity in her pulse for about a month.  Increased dyspnea and edema.    ROS: Denies fever, malais, weight loss, blurry vision, decreased visual acuity, cough, sputum,  hemoptysis, pleuritic pain, palpitaitons, heartburn, abdominal pain, melena,, claudication, or rash.  All other systems reviewed and negative  General: Affect appropriate [place] white female HEENT: normal Neck supple with no adenopathy JVP normal no bruits no thyromegaly Lungs clear with no wheezing and good diaphragmatic motion Heart:  S1/S2 no murmur, no rub, gallop or click PMI normal Abdomen: benighn, BS positve, no tenderness, no  AAA no bruit.  No HSM or HJR Distal pulses intact with no bruits Plus 2 edema Neuro non-focal Skin warm and dry No muscular weakness   Current Outpatient Prescriptions  Medication Sig Dispense Refill  . amoxicillin (AMOXIL) 500 MG capsule Take 500 mg by mouth. PRN DENTAL      . aspirin 325 MG tablet Take 325 mg by mouth daily.        . Calcium-Magnesium-Zinc 333-133-5 MG TABS Take by mouth. 4 po daily       . cetirizine (ZYRTEC) 10 MG tablet TAKE ONE TABLET BY MOUTH ONCE DAILY  90 tablet  10  . fluticasone (FLONASE) 50 MCG/ACT nasal spray PRN      . fluticasone (VERAMYST) 27.5 MCG/SPRAY nasal spray 2 sprays by Nasal route daily.        . furosemide (LASIX) 20 MG tablet Take 1 tablet (20 mg total) by mouth 2 (two) times daily.  180 tablet  3  . gabapentin (NEURONTIN) 300 MG capsule Take 300 mg by mouth 4 (four) times daily.      Marland Kitchen losartan (COZAAR) 100 MG tablet Take 1 tablet (100 mg total) by mouth daily.  90 tablet  3  . Multiple Vitamin (MULTIVITAMIN) capsule Take 1 capsule by mouth daily.        . nebivolol (BYSTOLIC) 10 MG tablet Take 1 tablet (10 mg total) by mouth daily.  90 tablet  3  . Omega-3 Fatty Acids (FISH OIL) 1200 MG CAPS Take by mouth. 2 po bid       . omeprazole (PRILOSEC) 20 MG capsule  Take 1 capsule (20 mg total) by mouth 2 (two) times daily.  180 capsule  1  . polyvinyl alcohol-povidone (REFRESH) 1.4-0.6 % ophthalmic solution 1-2 drops as needed.      . potassium chloride (K-DUR) 10 MEQ tablet Take 1 tablet (10 mEq total) by mouth daily.  90 tablet  3  . pravastatin (PRAVACHOL) 20 MG tablet Take 20 mg by mouth daily.      . psyllium (METAMUCIL) 58.6 % powder Take 1 packet by mouth daily.        . verapamil (VERELAN PM) 120 MG 24 hr capsule Take 1 capsule (120 mg total) by mouth at bedtime. GENERIC ONLY  90 capsule  3  . vitamin B-12 (CYANOCOBALAMIN) 1000 MCG tablet Take 1,000 mcg by mouth daily.        Marland Kitchen VITAMIN D, CHOLECALCIFEROL, PO Take by mouth. 1 po daily         No current facility-administered medications for this visit.    Allergies  Amlodipine besylate-valsartan  Electrocardiogram:  afib rate 87  LBBB pattern with LAD   Assessment and Plan

## 2013-02-24 NOTE — Assessment & Plan Note (Signed)
Long discussion with patient regarding diagnosis, rate control anticoagulation and possible [facility]  Likely related to her dyspnea.  Increase verapamil to 240 and continue beta blocker.  Start xarelto with appr labs  Check TSH/T4  F/U with me in 3-4 weeks to further discuss [facility]

## 2013-03-04 ENCOUNTER — Other Ambulatory Visit: Payer: Self-pay | Admitting: *Deleted

## 2013-03-04 ENCOUNTER — Telehealth: Payer: Self-pay | Admitting: Cardiovascular Disease

## 2013-03-04 MED ORDER — FUROSEMIDE 20 MG PO TABS: ORAL_TABLET | ORAL | Status: DC

## 2013-03-04 NOTE — Telephone Encounter (Signed)
Follow Up     Pt is following up on new prescription and refills. Please call.

## 2013-03-04 NOTE — Telephone Encounter (Signed)
Patient phoned stating [facility] trying to contact office regarding Rx's sent over on 02/24/13. Called pharmacy to clarify directions.

## 2013-03-05 ENCOUNTER — Ambulatory Visit (HOSPITAL_COMMUNITY): Payer: Medicare Other | Attending: Cardiology

## 2013-03-05 DIAGNOSIS — I4891 Unspecified atrial fibrillation: Secondary | ICD-10-CM | POA: Insufficient documentation

## 2013-03-05 DIAGNOSIS — R0609 Other forms of dyspnea: Secondary | ICD-10-CM | POA: Insufficient documentation

## 2013-03-05 DIAGNOSIS — R0989 Other specified symptoms and signs involving the circulatory and respiratory systems: Secondary | ICD-10-CM | POA: Insufficient documentation

## 2013-03-05 NOTE — Progress Notes (Signed)
Echocardiogram performed.  

## 2013-03-27 ENCOUNTER — Encounter: Payer: Self-pay | Admitting: *Deleted

## 2013-03-27 ENCOUNTER — Encounter: Payer: Self-pay | Admitting: Cardiovascular Disease

## 2013-03-27 ENCOUNTER — Ambulatory Visit (INDEPENDENT_AMBULATORY_CARE_PROVIDER_SITE_OTHER): Payer: No Typology Code available for payment source | Admitting: Cardiovascular Disease

## 2013-03-27 ENCOUNTER — Other Ambulatory Visit: Payer: Self-pay | Admitting: Cardiovascular Disease

## 2013-03-27 ENCOUNTER — Other Ambulatory Visit: Payer: Self-pay | Admitting: Family Medicine

## 2013-03-27 VITALS — BP 166/92 | HR 91 | Ht 62.0 in | Wt 286.0 lb

## 2013-03-27 DIAGNOSIS — Z01810 Encounter for preprocedural cardiovascular examination: Secondary | ICD-10-CM

## 2013-03-27 DIAGNOSIS — I10 Essential (primary) hypertension: Secondary | ICD-10-CM

## 2013-03-27 DIAGNOSIS — I4891 Unspecified atrial fibrillation: Secondary | ICD-10-CM

## 2013-03-27 DIAGNOSIS — R0602 Shortness of breath: Secondary | ICD-10-CM

## 2013-03-27 LAB — CBC WITH DIFFERENTIAL/PLATELET
Basophils Relative: 0.1 % (ref 0.0–3.0)
Eosinophils Relative: 1.7 % (ref 0.0–5.0)
Lymphocytes Relative: 30.6 % (ref 12.0–46.0)
MCV: 91.3 fl (ref 78.0–100.0)
Monocytes Absolute: 0.5 10*3/uL (ref 0.1–1.0)
Neutrophils Relative %: 59.4 % (ref 43.0–77.0)
Platelets: 190 10*3/uL (ref 150.0–400.0)
RBC: 4.49 Mil/uL (ref 3.87–5.11)
WBC: 6 10*3/uL (ref 4.5–10.5)

## 2013-03-27 LAB — BASIC METABOLIC PANEL
BUN: 23 mg/dL (ref 6–23)
Chloride: 106 mEq/L (ref 96–112)
GFR: 69.22 mL/min (ref >60.00)
Glucose, Bld: 121 mg/dL — ABNORMAL HIGH (ref 70–99)
Potassium: 4.2 mEq/L (ref 3.5–5.1)
Sodium: 138 mEq/L (ref 135–145)

## 2013-03-27 LAB — BRAIN NATRIURETIC PEPTIDE: Pro B Natriuretic peptide (BNP): 293 pg/mL — ABNORMAL HIGH (ref 0.0–100.0)

## 2013-03-27 MED ORDER — METOLAZONE 2.5 MG PO TABS: 2.5000 mg | ORAL_TABLET | Freq: Every day | ORAL | Status: DC

## 2013-03-27 NOTE — Assessment & Plan Note (Signed)
Willing to proceed with [facility] On Rx xarelto and has not missed a dose. Risks discussed including stroke Will help keep her out of CHF with poor EF  Will arrange for next week

## 2013-03-27 NOTE — Patient Instructions (Addendum)
Your physician has recommended that you have a Cardioversion (DCCV). Electrical Cardioversion uses a jolt of electricity to your heart either through paddles or wired patches attached to your chest. This is a controlled, usually prescheduled, procedure. Defibrillation is done under light anesthesia in the hospital, and you usually go home the day of the procedure. This is done to get your heart back into a normal rhythm. You are not awake for the procedure. Please see the instruction sheet given to you today. Your physician recommends that you return for lab work in: TODAY  BMET BNP CBC  DX V72.81 Your physician has recommended you make the following change in your medication: START  Zaroxolyn 2.5 MG  30 MIN BEFORE FUROSEMIDE EVERY AM

## 2013-03-27 NOTE — Progress Notes (Signed)
Patient ID: Charlotte [name], female   DOB: June 22, 1936, 77 y.o.   MRN: [id number] Charlotte [name] was initially seen for Dobutamine induced NSVT and ? syncope in the setting of LBBB. The patient does not think she actually passed out.  She underwent a Dobutamine stress test where she had NSVT. Subsequent catheterization  demonstrated no obstructive CAD and preserved LV function. She has had poorly controlled HTN but as her episode occured while on amlodipine in the setting of her LBBB, it was stopped. I reviewed her BP and weight records for the last 4 months. She is not well controlled with average systolics still 150-160. Weight is still hight around 274.   Started on ACE and diuretic a month ago Also noted to be in new onset afib and started on anticoagulation.    So long as she is obese, with varicosities and sednentary with salt indiscretion her edema will be hard to Rx  Unfortunately today she was also noted to be in afib. No contraindications to anticoagulation. She takes her BP at home and thinks it showed irregularity in her pulse for about a month. Increased dyspnea and edema.   Echo done 03/05/13 Study Conclusions  - Left ventricle: The cavity size was normal. Wall thickness was increased in a pattern of moderate LVH. Systolic function was severely reduced. The estimated ejection fraction was in the range of 25% to 30%. There is akinesis of the anteroseptal and apical myocardium. There is akinesis of the mid-distalinferior myocardium. The study is not technically sufficient to allow evaluation of LV diastolic function. - Ventricular septum: Septal motion showed abnormal function and dyssynergy. These changes are consistent with a left bundle branch block. - Aortic valve: Moderate regurgitation. - Mitral valve: Calcified annulus. Mildly thickened leaflets . Moderate regurgitation. - Left atrium: The atrium was moderately dilated. - Right atrium: The atrium was mildly dilated. - Pulmonary  arteries: Systolic pressure was mildly increased. PA peak pressure: 32mm Hg (S).   ROS: Denies fever, malais, weight loss, blurry vision, decreased visual acuity, cough, sputum, SOB, hemoptysis, pleuritic pain, palpitaitons, heartburn, abdominal pain, melena, lower extremity edema, claudication, or rash.  All other systems reviewed and negative  General: Affect appropriate Obese white female HEENT: normal Neck supple with no adenopathy JVP normal no bruits no thyromegaly Lungs clear with no wheezing and good diaphragmatic motion Heart:  S1/S2 no murmur, no rub, gallop or click PMI normal Abdomen: benighn, BS positve, no tenderness, no AAA no bruit.  No HSM or HJR Distal pulses intact with no bruits Plus 2 bilateral edema with varicose veins Neuro non-focal Skin warm and dry No muscular weakness   Current Outpatient Prescriptions  Medication Sig Dispense Refill  . Acetaminophen (TYLENOL PO) Take by mouth.      Marland Kitchen amoxicillin (AMOXIL) 500 MG capsule Take 500 mg by mouth. PRN DENTAL      . aspirin 325 MG tablet Take 325 mg by mouth daily.        . Calcium-Magnesium-Zinc 333-133-5 MG TABS Take by mouth. 4 po daily       . cetirizine (ZYRTEC) 10 MG tablet TAKE ONE TABLET BY MOUTH ONCE DAILY  90 tablet  10  . fluticasone (FLONASE) 50 MCG/ACT nasal spray PRN      . furosemide (LASIX) 20 MG tablet 1 TAB IN AM AND 2 TABS PM  270 tablet  3  . gabapentin (NEURONTIN) 300 MG capsule Take 300 mg by mouth 4 (four) times daily.      Marland Kitchen  losartan (COZAAR) 100 MG tablet Take 1 tablet (100 mg total) by mouth daily.  90 tablet  3  . Multiple Vitamin (MULTIVITAMIN) capsule Take 1 capsule by mouth daily.        . nebivolol (BYSTOLIC) 10 MG tablet Take 1 tablet (10 mg total) by mouth daily.  90 tablet  3  . Omega-3 Fatty Acids (FISH OIL) 1200 MG CAPS Take by mouth. 2 po bid       . omeprazole (PRILOSEC) 20 MG capsule Take 1 capsule (20 mg total) by mouth 2 (two) times daily.  180 capsule  1  . polyvinyl  alcohol-povidone (REFRESH) 1.4-0.6 % ophthalmic solution 1-2 drops as needed.      . potassium chloride (K-DUR) 10 MEQ tablet Take 1 tablet (10 mEq total) by mouth 2 (two) times daily.  180 tablet  3  . pravastatin (PRAVACHOL) 20 MG tablet Take 40 mg by mouth daily.       . psyllium (METAMUCIL) 58.6 % powder Take 1 packet by mouth daily.        . Rivaroxaban (XARELTO) 20 MG TABS Take 1 tablet (20 mg total) by mouth daily.  90 tablet  3  . verapamil (VERELAN PM) 240 MG 24 hr capsule Take 1 capsule (240 mg total) by mouth at bedtime. GENERIC ONLY  90 capsule  3  . vitamin B-12 (CYANOCOBALAMIN) 1000 MCG tablet Take 1,000 mcg by mouth daily.        Marland Kitchen VITAMIN D, CHOLECALCIFEROL, PO Take by mouth. 1 po daily        No current facility-administered medications for this visit.    Allergies  Amlodipine besylate-valsartan  Electrocardiogram: 5/14 afib rate 80's LBBB LAD    Assessment and Plan

## 2013-03-27 NOTE — Assessment & Plan Note (Signed)
Add zaroxyln to current lasix as she is still edematous.  Check BMET and BNP before [facility]

## 2013-03-27 NOTE — Assessment & Plan Note (Signed)
Well controlled.  Continue current medications and low sodium Dash type diet.    

## 2013-04-01 ENCOUNTER — Ambulatory Visit (HOSPITAL_COMMUNITY)
Admission: RE | Admit: 2013-04-01 | Discharge: 2013-04-01 | Disposition: A | Discharge status: Home / Self Care | Payer: Medicare Other | Source: Ambulatory Visit | Attending: Cardiovascular Disease | Admitting: Cardiovascular Disease

## 2013-04-01 ENCOUNTER — Encounter (HOSPITAL_COMMUNITY): Payer: Self-pay

## 2013-04-01 ENCOUNTER — Ambulatory Visit (HOSPITAL_COMMUNITY): Payer: Medicare Other | Admitting: Anesthesiology

## 2013-04-01 ENCOUNTER — Encounter (HOSPITAL_COMMUNITY): Payer: Self-pay | Admitting: Anesthesiology

## 2013-04-01 ENCOUNTER — Encounter (HOSPITAL_COMMUNITY)
Admission: RE | Disposition: A | Discharge status: Home / Self Care | Payer: Self-pay | Source: Ambulatory Visit | Attending: Cardiovascular Disease

## 2013-04-01 DIAGNOSIS — I447 Left bundle-branch block, unspecified: Secondary | ICD-10-CM | POA: Insufficient documentation

## 2013-04-01 DIAGNOSIS — I8390 Asymptomatic varicose veins of unspecified lower extremity: Secondary | ICD-10-CM | POA: Insufficient documentation

## 2013-04-01 DIAGNOSIS — I10 Essential (primary) hypertension: Secondary | ICD-10-CM | POA: Insufficient documentation

## 2013-04-01 DIAGNOSIS — I4891 Unspecified atrial fibrillation: Secondary | ICD-10-CM

## 2013-04-01 DIAGNOSIS — E669 Obesity, unspecified: Secondary | ICD-10-CM | POA: Insufficient documentation

## 2013-04-01 DIAGNOSIS — Z7982 Long term (current) use of aspirin: Secondary | ICD-10-CM | POA: Insufficient documentation

## 2013-04-01 DIAGNOSIS — Z7901 Long term (current) use of anticoagulants: Secondary | ICD-10-CM | POA: Insufficient documentation

## 2013-04-01 DIAGNOSIS — Z79899 Other long term (current) drug therapy: Secondary | ICD-10-CM | POA: Insufficient documentation

## 2013-04-01 HISTORY — DX: Adverse effect of unspecified anesthetic, initial encounter: T41.45XA

## 2013-04-01 HISTORY — PX: CARDIOVERSION: SHX1299

## 2013-04-01 LAB — GLUCOSE, CAPILLARY: Glucose-Capillary: 116 mg/dL — ABNORMAL HIGH (ref 70–99)

## 2013-04-01 SURGERY — CARDIOVERSION
Anesthesia: Monitor Anesthesia Care

## 2013-04-01 MED ORDER — SODIUM CHLORIDE 0.9 % IV SOLN: INTRAVENOUS | Status: DC

## 2013-04-01 MED ORDER — PROPOFOL 10 MG/ML IV BOLUS
INTRAVENOUS | Status: DC | PRN
  Administered 2013-04-01: 50 mg via INTRAVENOUS

## 2013-04-01 MED ORDER — LIDOCAINE HCL (CARDIAC) 20 MG/ML IV SOLN
INTRAVENOUS | Status: DC | PRN
  Administered 2013-04-01: 70 mg via INTRAVENOUS

## 2013-04-01 MED ORDER — SODIUM CHLORIDE 0.9 % IV SOLN
INTRAVENOUS | Status: DC | PRN
  Administered 2013-04-01: 11:00:00 via INTRAVENOUS

## 2013-04-01 NOTE — Interval H&P Note (Signed)
History and Physical Interval Note:  04/01/2013 9:47 AM  Charlotte [name]  has presented today for surgery, with the diagnosis of AFIB  The various methods of treatment have been discussed with the patient and family. After consideration of risks, benefits and other options for treatment, the patient has consented to  Procedure(s): CARDIOVERSION (N/A) as a surgical intervention .  The patient's history has been reviewed, patient examined, no change in status, stable for surgery.  I have reviewed the patient's chart and labs.  Questions were answered to the patient's satisfaction.     Charlton Haws

## 2013-04-01 NOTE — Anesthesia Postprocedure Evaluation (Signed)
  Anesthesia Post-op Note  Patient: Charlotte [name]  Procedure(s) Performed: Procedure(s): CARDIOVERSION (N/A)  Patient Location: Endoscopy Unit  Anesthesia Type:MAC  Level of Consciousness: awake, alert  and oriented  Airway and Oxygen Therapy: Patient Spontanous Breathing and Patient connected to nasal cannula oxygen  Post-op Pain: none  Post-op Assessment: Post-op Vital signs reviewed, Patient's Cardiovascular Status Stable, Respiratory Function Stable, Patent Airway, No signs of Nausea or vomiting and Pain level controlled  Post-op Vital Signs: Reviewed and stable  Complications: No apparent anesthesia complications

## 2013-04-01 NOTE — Anesthesia Preprocedure Evaluation (Addendum)
Anesthesia Evaluation  Patient identified by MRN, date of birth, ID band Patient awake    Reviewed: Allergy & Precautions, H&P , NPO status , Patient's Chart, lab work & pertinent test results, reviewed documented beta blocker date and time   History of Anesthesia Complications (+) PONV  Airway Mallampati: II TM Distance: >3 FB Neck ROM: Full    Dental  (+) Teeth Intact   Pulmonary shortness of breath,    Pulmonary exam normal       Cardiovascular hypertension, Pt. on medications + dysrhythmias Atrial Fibrillation Rhythm:Irregular     Neuro/Psych    GI/Hepatic GERD-  ,  Endo/Other    Renal/GU      Musculoskeletal   Abdominal Normal abdominal exam  (+)   Peds  Hematology   Anesthesia Other Findings   Reproductive/Obstetrics                           Anesthesia Physical Anesthesia Plan  ASA: III  Anesthesia Plan: MAC   Post-op Pain Management:    Induction: Intravenous  Airway Management Planned: Mask  Additional Equipment:   Intra-op Plan:   Post-operative Plan:   Informed Consent: I have reviewed the patients History and Physical, chart, labs and discussed the procedure including the risks, benefits and alternatives for the proposed anesthesia with the patient or authorized representative who has indicated his/her understanding and acceptance.   Dental advisory given  Plan Discussed with: CRNA, Anesthesiologist and Surgeon  Anesthesia Plan Comments:         Anesthesia Quick Evaluation

## 2013-04-01 NOTE — Transfer of Care (Signed)
Immediate Anesthesia Transfer of Care Note  Patient: Charlotte [name]  Procedure(s) Performed: Procedure(s): CARDIOVERSION (N/A)  Patient Location: Endoscopy Unit  Anesthesia Type:MAC  Level of Consciousness: awake, alert  and oriented  Airway & Oxygen Therapy: Patient Spontanous Breathing and Patient connected to nasal cannula oxygen  Post-op Assessment: Report given to PACU RN  Post vital signs: Reviewed and stable  Complications: No apparent anesthesia complications

## 2013-04-01 NOTE — H&P (View-Only) (Signed)
Patient ID: Charlotte [name], female   DOB: Aug 29, 1936, 77 y.o.   MRN: [id number] Charlotte [name] was initially seen for Dobutamine induced NSVT and ? syncope in the setting of LBBB. The patient does not think she actually passed out.  She underwent a Dobutamine stress test where she had NSVT. Subsequent catheterization  demonstrated no obstructive CAD and preserved LV function. She has had poorly controlled HTN but as her episode occured while on amlodipine in the setting of her LBBB, it was stopped. I reviewed her BP and weight records for the last 4 months. She is not well controlled with average systolics still 150-160. Weight is still hight around 274.   Started on ACE and diuretic a month ago Also noted to be in new onset afib and started on anticoagulation.    So long as she is obese, with varicosities and sednentary with salt indiscretion her edema will be hard to Rx  Unfortunately today she was also noted to be in afib. No contraindications to anticoagulation. She takes her BP at home and thinks it showed irregularity in her pulse for about a month. Increased dyspnea and edema.   Echo done 03/05/13 Study Conclusions  - Left ventricle: The cavity size was normal. Wall thickness was increased in a pattern of moderate LVH. Systolic function was severely reduced. The estimated ejection fraction was in the range of 25% to 30%. There is akinesis of the anteroseptal and apical myocardium. There is akinesis of the mid-distalinferior myocardium. The study is not technically sufficient to allow evaluation of LV diastolic function. - Ventricular septum: Septal motion showed abnormal function and dyssynergy. These changes are consistent with a left bundle branch block. - Aortic valve: Moderate regurgitation. - Mitral valve: Calcified annulus. Mildly thickened leaflets . Moderate regurgitation. - Left atrium: The atrium was moderately dilated. - Right atrium: The atrium was mildly dilated. - Pulmonary  arteries: Systolic pressure was mildly increased. PA peak pressure: 32mm Hg (S).   ROS: Denies fever, malais, weight loss, blurry vision, decreased visual acuity, cough, sputum, SOB, hemoptysis, pleuritic pain, palpitaitons, heartburn, abdominal pain, melena, lower extremity edema, claudication, or rash.  All other systems reviewed and negative  General: Affect appropriate Obese white female HEENT: normal Neck supple with no adenopathy JVP normal no bruits no thyromegaly Lungs clear with no wheezing and good diaphragmatic motion Heart:  S1/S2 no murmur, no rub, gallop or click PMI normal Abdomen: benighn, BS positve, no tenderness, no AAA no bruit.  No HSM or HJR Distal pulses intact with no bruits Plus 2 bilateral edema with varicose veins Neuro non-focal Skin warm and dry No muscular weakness   Current Outpatient Prescriptions  Medication Sig Dispense Refill  . Acetaminophen (TYLENOL PO) Take by mouth.      Marland Kitchen amoxicillin (AMOXIL) 500 MG capsule Take 500 mg by mouth. PRN DENTAL      . aspirin 325 MG tablet Take 325 mg by mouth daily.        . Calcium-Magnesium-Zinc 333-133-5 MG TABS Take by mouth. 4 po daily       . cetirizine (ZYRTEC) 10 MG tablet TAKE ONE TABLET BY MOUTH ONCE DAILY  90 tablet  10  . fluticasone (FLONASE) 50 MCG/ACT nasal spray PRN      . furosemide (LASIX) 20 MG tablet 1 TAB IN AM AND 2 TABS PM  270 tablet  3  . gabapentin (NEURONTIN) 300 MG capsule Take 300 mg by mouth 4 (four) times daily.      Marland Kitchen  losartan (COZAAR) 100 MG tablet Take 1 tablet (100 mg total) by mouth daily.  90 tablet  3  . Multiple Vitamin (MULTIVITAMIN) capsule Take 1 capsule by mouth daily.        . nebivolol (BYSTOLIC) 10 MG tablet Take 1 tablet (10 mg total) by mouth daily.  90 tablet  3  . Omega-3 Fatty Acids (FISH OIL) 1200 MG CAPS Take by mouth. 2 po bid       . omeprazole (PRILOSEC) 20 MG capsule Take 1 capsule (20 mg total) by mouth 2 (two) times daily.  180 capsule  1  . polyvinyl  alcohol-povidone (REFRESH) 1.4-0.6 % ophthalmic solution 1-2 drops as needed.      . potassium chloride (K-DUR) 10 MEQ tablet Take 1 tablet (10 mEq total) by mouth 2 (two) times daily.  180 tablet  3  . pravastatin (PRAVACHOL) 20 MG tablet Take 40 mg by mouth daily.       . psyllium (METAMUCIL) 58.6 % powder Take 1 packet by mouth daily.        . Rivaroxaban (XARELTO) 20 MG TABS Take 1 tablet (20 mg total) by mouth daily.  90 tablet  3  . verapamil (VERELAN PM) 240 MG 24 hr capsule Take 1 capsule (240 mg total) by mouth at bedtime. GENERIC ONLY  90 capsule  3  . vitamin B-12 (CYANOCOBALAMIN) 1000 MCG tablet Take 1,000 mcg by mouth daily.        Marland Kitchen VITAMIN D, CHOLECALCIFEROL, PO Take by mouth. 1 po daily        No current facility-administered medications for this visit.    Allergies  Amlodipine besylate-valsartan  Electrocardiogram: 5/14 afib rate 80's LBBB LAD    Assessment and Plan

## 2013-04-01 NOTE — Preoperative (Signed)
Beta Blockers   Reason not to administer Beta Blockers:Not Applicable 

## 2013-04-01 NOTE — CV Procedure (Signed)
DCC: On Rx xarelto NPO since 8:00pm 03/31/13 Baseline LBBB No CAD EF 25-30% by recent echo  50 mg of propofol DCC X1 150J  Afib rate 84 to NSR with PAC rate 62  No immediate neurologic sequelae   Charlton Haws

## 2013-04-02 ENCOUNTER — Encounter (HOSPITAL_COMMUNITY): Payer: Self-pay | Admitting: Cardiovascular Disease

## 2013-04-02 ENCOUNTER — Telehealth: Payer: Self-pay | Admitting: *Deleted

## 2013-04-02 NOTE — Telephone Encounter (Signed)
         Meyah, Corle - 03/27/13 ','<More Detail >>       Wendall Stade, MD       Sent: Wed April 01, 2013 11:27 AM                  Message     Needs post [facility] f/u next available or with PA in 4-6 weeks            Attached Reports    The sender attached the following reports to this message:                Patient Information    Patient Name Sex DOB SSN   Charlotte [name], Charlotte [name] Female 03/06/1936 [id number]      CV Procedure signed by Wendall Stade, MD at 04/01/2013 11:25 AM    Author: Wendall Stade, MD Service: (none) Author Type: Physician   Filed: 04/01/2013 11:25 AM Note Time: 04/01/2013 11:21 AM         DCC: On Rx xarelto NPO since 8:00pm 03/31/13  Baseline LBBB No CAD EF 25-30% by recent echo  50 mg of propofol  DCC X1 150J Afib rate 84 to NSR with PAC rate 62  No immediate neurologic sequelae  Peter Nishan       BUSY  X1 UNABLE TO LEAVE MESSAGE./CY

## 2013-04-07 NOTE — Telephone Encounter (Signed)
PT HAS F/U  WITH [facility]OTT [facility]  ON 05-07-13 AT  11:30 .Zack Seal

## 2013-05-07 ENCOUNTER — Encounter: Payer: Self-pay | Admitting: Physician Assistant

## 2013-05-07 ENCOUNTER — Telehealth: Payer: Self-pay | Admitting: *Deleted

## 2013-05-07 ENCOUNTER — Ambulatory Visit (INDEPENDENT_AMBULATORY_CARE_PROVIDER_SITE_OTHER): Payer: No Typology Code available for payment source | Admitting: Physician Assistant

## 2013-05-07 VITALS — BP 129/72 | HR 57 | Ht 63.0 in | Wt 284.0 lb

## 2013-05-07 DIAGNOSIS — I2510 Atherosclerotic heart disease of native coronary artery without angina pectoris: Secondary | ICD-10-CM

## 2013-05-07 DIAGNOSIS — I4891 Unspecified atrial fibrillation: Secondary | ICD-10-CM

## 2013-05-07 DIAGNOSIS — E785 Hyperlipidemia, unspecified: Secondary | ICD-10-CM

## 2013-05-07 DIAGNOSIS — I428 Other cardiomyopathies: Secondary | ICD-10-CM

## 2013-05-07 DIAGNOSIS — I5022 Chronic systolic (congestive) heart failure: Secondary | ICD-10-CM

## 2013-05-07 DIAGNOSIS — I10 Essential (primary) hypertension: Secondary | ICD-10-CM

## 2013-05-07 LAB — BASIC METABOLIC PANEL
CO2: 33 mEq/L — ABNORMAL HIGH (ref 19–32)
Calcium: 9.3 mg/dL (ref 8.4–10.5)
Creatinine, Ser: 0.8 mg/dL (ref 0.4–1.2)
GFR: 76.41 mL/min (ref >60.00)
Sodium: 137 mEq/L (ref 135–145)

## 2013-05-07 NOTE — Patient Instructions (Addendum)
PLEASE SCHEDULE TO HAVE AN ECHO; DX 427.31, 428.22, 425.4  LAB TODAY; BMET  FOLLOW UP WITH DR. Eden Emms IN 3 MONTHS  YOU WILL NEED AN APPT TO SEE THE COUMADIN CLINIC TO DISCUSS SWITCHING FROM XARELTO TO COUAMDIN

## 2013-05-07 NOTE — Progress Notes (Signed)
[address], [address] [place], [place]  [address] Phone: [phone] Fax:  [phone]  Date:  05/07/2013   ID:  Charlotte [name], DOB [birth year], MRN [id number]  PCP:  [name], MD  Cardiologist:  Dr. Charlton Haws     History of Present Illness: Charlotte [name] is a 77 y.o. female who returns for followup after recent cardioversion.  She has a history of LBBB, HL, HTN. Dobutamine echocardiogram done in 06/2010 for syncope and demonstrated nonsustained ventricular tachycardia. EF at that time was normal. LHC 06/2010: EF 55%, mild plaque in the LAD 20-30%, otherwise normal coronary arteries. She was recently seen by Dr. Eden Emms and noted to be in atrial fibrillation. Echocardiogram 02/2013: Moderate LVH, EF 25-30%, anteroseptal and apical HK, moderate AI, MAC, moderate MR, moderate LAE, mild RAE, PASP 32. Patient was placed on Xarelto for anticoagulation.  She underwent DCCV 04/01/13 after several weeks of uninterrupted treatment with Xarelto.  She is doing well.  Notes less dyspnea.  She reports NYHA Class IIb symptoms.  No orthopnea, PND.   LE edema is improved.  No CP, syncope or palpitations.    Labs (5/14):  FT4 0.86 Labs (6/14):  K 4.2, Cr 0.9, Hgb 13.8  Wt Readings from Last 3 Encounters:  05/07/13 284 lb (128.822 kg)  03/27/13 286 lb (129.729 kg)  02/24/13 292 lb 6.4 oz (132.632 kg)     Past Medical History  Diagnosis Date  . HYPERLIPIDEMIA, MILD   . CATARACTS   . HYPERTENSION, BENIGN SYSTEMIC   . RHINITIS, ALLERGIC   . GERD (gastroesophageal reflux disease)   . Irritable bowel syndrome   . OSTEOARTHRITIS   . MEDIAL EPICONDYLITIS   . OSTEOPENIA   . SYNCOPE   . VITAMIN D DEFICIENCY   . Complication of anesthesia 2009    nausea and vomitting  . Chronic systolic heart failure     in setting of AFib;  Echocardiogram 02/2013: Moderate LVH, EF 25-30%, anteroseptal and apical HK, moderate AI, MAC, moderate MR, moderate LAE, mild RAE, PASP 32  . CAD (coronary artery  disease)     LHC 06/2010: EF 55%, mild plaque in the LAD 20-30%, otherwise normal coronary arteries  . LBBB (left bundle branch block)   . NSVT (nonsustained ventricular tachycardia)     during Dob Echo 2011 => normal cath  . Atrial fibrillation     a. s/p DCCV 03/2013    Current Outpatient Prescriptions  Medication Sig Dispense Refill  . Acetaminophen (TYLENOL PO) Take by mouth.      Marland Kitchen amoxicillin (AMOXIL) 500 MG capsule Take 500 mg by mouth. PRN DENTAL      . aspirin 325 MG tablet Take 325 mg by mouth daily.        . Calcium-Magnesium-Zinc 333-133-5 MG TABS Take by mouth. 4 po daily       . cetirizine (ZYRTEC) 10 MG tablet TAKE ONE TABLET BY MOUTH ONE TIME DAILY  90 tablet  0  . fluticasone (FLONASE) 50 MCG/ACT nasal spray PRN      . furosemide (LASIX) 20 MG tablet 1 TAB IN AM AND 2 TABS PM  270 tablet  3  . gabapentin (NEURONTIN) 300 MG capsule Take 300 mg by mouth 4 (four) times daily.      Marland Kitchen losartan (COZAAR) 100 MG tablet Take 1 tablet (100 mg total) by mouth daily.  90 tablet  3  . metolazone (ZAROXOLYN) 2.5 MG tablet Take 1 tablet (2.5 mg total) by mouth daily.  30 tablet  3  . Multiple Vitamin (MULTIVITAMIN) capsule Take 1 capsule by mouth daily.        . nebivolol (BYSTOLIC) 10 MG tablet Take 1 tablet (10 mg total) by mouth daily.  90 tablet  3  . Omega-3 Fatty Acids (FISH OIL) 1200 MG CAPS Take by mouth. 2 po bid       . polyvinyl alcohol-povidone (REFRESH) 1.4-0.6 % ophthalmic solution 1-2 drops as needed.      . potassium chloride (K-DUR) 10 MEQ tablet Take 1 tablet (10 mEq total) by mouth 2 (two) times daily.  180 tablet  3  . pravastatin (PRAVACHOL) 20 MG tablet Take 40 mg by mouth daily.       . psyllium (METAMUCIL) 58.6 % powder Take 1 packet by mouth daily.        . Rivaroxaban (XARELTO) 20 MG TABS Take 1 tablet (20 mg total) by mouth daily.  90 tablet  3  . verapamil (VERELAN PM) 240 MG 24 hr capsule Take 1 capsule (240 mg total) by mouth at bedtime. GENERIC ONLY  90  capsule  3  . vitamin B-12 (CYANOCOBALAMIN) 1000 MCG tablet Take 1,000 mcg by mouth daily.        Marland Kitchen VITAMIN D, CHOLECALCIFEROL, PO Take by mouth. 1 po daily        No current facility-administered medications for this visit.    Allergies:    Allergies  Allergen Reactions  . Amlodipine Besylate-Valsartan     Social History:  The patient  reports that she quit smoking about 42 years ago. She does not have any smokeless tobacco history on file. She reports that she does not drink alcohol or use illicit drugs.   ROS:  Please see the history of present illness.   She notes some constipation with metolazone.   All other systems reviewed and negative.   PHYSICAL EXAM: VS:  BP 129/72  Pulse 57  Ht 5\' 3"  (1.6 m)  Wt 284 lb (128.822 kg)  BMI 50.32 kg/m2 Well nourished, well developed, in no acute distress HEENT: normal Neck: no JVD Cardiac:  normal S1, S2; RRR; no murmur Lungs:  clear to auscultation bilaterally, no wheezing, rhonchi or rales Abd: soft, nontender, no hepatomegaly Ext: no edema Skin: warm and dry Neuro:  CNs 2-12 intact, no focal abnormalities noted  EKG:  Sinus brady, HR 57, LAD, AS Q waves     ASSESSMENT AND PLAN:  1. Atrial Fibrillation: Maintaining sinus rhythm.  CHADS2-VASc=6.  She should remain on anticoagulation.  Xarelto is too expensive.  She would like to transition to coumadin.  Will have her change from Xarelto to Coumadin with the assistance of the coumadin clinic.   2. Chronic Systolic CHF:  Volume stable.  Check BMET today. 3. Cardiomyopathy:  ? Tachy mediated.  Continue beta blocker and ARB.  Repeat echo now that she has been back in NSR for ~ 1 month.  If EF still down, consider stopping CCB.   4. Non-Obstructive CAD:  Continue ASA and statin. 5. Hyperlipidemia:  Continue Pravastatin. 6. Hypertension:  Controlled.  Continue current therapy.  7. Disposition:  F/u with Dr. Charlton Haws in 3 mos.   Signed, Tereso Newcomer, PA-C  05/07/2013 12:12 PM

## 2013-05-07 NOTE — Telephone Encounter (Signed)
Message copied by Tarri Fuller on Thu May 07, 2013  4:15 PM ------      Message from: [place], [place] T      Created: Thu May 07, 2013  3:30 PM       K+ and creatinine ok      CO2 up a little      Change Metolazone to every other day      Repeat BMET in 2 weeks      Tereso Newcomer, PA-C        05/07/2013 3:30 PM ------

## 2013-05-08 ENCOUNTER — Telehealth: Payer: Self-pay | Admitting: Physician Assistant

## 2013-05-08 ENCOUNTER — Telehealth: Payer: Self-pay | Admitting: *Deleted

## 2013-05-08 DIAGNOSIS — I4891 Unspecified atrial fibrillation: Secondary | ICD-10-CM

## 2013-05-08 MED ORDER — WARFARIN SODIUM 5 MG PO TABS: 5.0000 mg | ORAL_TABLET | Freq: Every day | ORAL | Status: DC

## 2013-05-08 MED ORDER — METOLAZONE 2.5 MG PO TABS: 2.5000 mg | ORAL_TABLET | ORAL | Status: DC

## 2013-05-08 NOTE — Telephone Encounter (Signed)
pt advised per Scott W. PA to overlap Xarelto and Coumadin starting 05/11/13 until CVRR appt 05/14/13. Pt is switching from Xarelto to Coumadin. Rx for 5 mg coumadin sent in to [facility] today. Pt verbalized understanding of Plan of Care 

## 2013-05-08 NOTE — Telephone Encounter (Signed)
pt advised per Bing Neighbors. PA to overlap Xarelto and Coumadin starting 05/11/13 until CVRR appt 05/14/13. Pt is switching from Xarelto to Coumadin. Rx for 5 mg coumadin sent in to [facility] today. Pt verbalized understanding of Plan of Care

## 2013-05-08 NOTE — Telephone Encounter (Signed)
pt notfied about lab results and to change metolazone to QOD, bmet 8/15, pt verbalized understanding to plan of care

## 2013-05-08 NOTE — Telephone Encounter (Signed)
Message copied by Tarri Fuller on Fri May 08, 2013  6:09 PM ------      Message from: [place], [place] T      Created: Thu May 07, 2013  3:30 PM       K+ and creatinine ok      CO2 up a little      Change Metolazone to every other day      Repeat BMET in 2 weeks      Tereso Newcomer, PA-C        05/07/2013 3:30 PM ------

## 2013-05-08 NOTE — Telephone Encounter (Signed)
Reviewed with Weston Brass, PharmD. Overlap Xarelto and coumadin for 3 days. Have her start coumadin 3 days before her appt with the coumadin clinic on 8/7. Take coumadin 5 mg QD until seen by the coumadin clinic. I will fwd to Weston Brass, PharmD as well for FYI. Tereso Newcomer, PA-C   05/08/2013 1:28 PM

## 2013-05-14 ENCOUNTER — Ambulatory Visit (INDEPENDENT_AMBULATORY_CARE_PROVIDER_SITE_OTHER): Payer: Medicare Other

## 2013-05-14 ENCOUNTER — Ambulatory Visit (HOSPITAL_COMMUNITY): Payer: Medicare Other | Attending: Physician Assistant | Admitting: Radiology

## 2013-05-14 ENCOUNTER — Encounter: Payer: Self-pay | Admitting: Physician Assistant

## 2013-05-14 DIAGNOSIS — R609 Edema, unspecified: Secondary | ICD-10-CM | POA: Insufficient documentation

## 2013-05-14 DIAGNOSIS — I5022 Chronic systolic (congestive) heart failure: Secondary | ICD-10-CM

## 2013-05-14 DIAGNOSIS — I447 Left bundle-branch block, unspecified: Secondary | ICD-10-CM | POA: Insufficient documentation

## 2013-05-14 DIAGNOSIS — R55 Syncope and collapse: Secondary | ICD-10-CM | POA: Insufficient documentation

## 2013-05-14 DIAGNOSIS — I4891 Unspecified atrial fibrillation: Secondary | ICD-10-CM

## 2013-05-14 DIAGNOSIS — Z87891 Personal history of nicotine dependence: Secondary | ICD-10-CM | POA: Insufficient documentation

## 2013-05-14 DIAGNOSIS — Z7901 Long term (current) use of anticoagulants: Secondary | ICD-10-CM | POA: Insufficient documentation

## 2013-05-14 DIAGNOSIS — I428 Other cardiomyopathies: Secondary | ICD-10-CM

## 2013-05-14 LAB — POCT INR: INR: 1.6

## 2013-05-14 NOTE — Patient Instructions (Signed)

## 2013-05-14 NOTE — Progress Notes (Signed)
Echocardiogram performed.  

## 2013-05-18 ENCOUNTER — Ambulatory Visit (INDEPENDENT_AMBULATORY_CARE_PROVIDER_SITE_OTHER): Payer: Medicare Other | Admitting: *Deleted

## 2013-05-18 DIAGNOSIS — I4891 Unspecified atrial fibrillation: Secondary | ICD-10-CM

## 2013-05-18 DIAGNOSIS — Z7901 Long term (current) use of anticoagulants: Secondary | ICD-10-CM

## 2013-05-18 LAB — POCT INR: INR: 1.2

## 2013-05-18 MED ORDER — WARFARIN SODIUM 5 MG PO TABS: 5.0000 mg | ORAL_TABLET | ORAL | Status: DC

## 2013-05-22 ENCOUNTER — Telehealth: Payer: Self-pay | Admitting: *Deleted

## 2013-05-22 ENCOUNTER — Other Ambulatory Visit (INDEPENDENT_AMBULATORY_CARE_PROVIDER_SITE_OTHER): Payer: Medicare Other

## 2013-05-22 ENCOUNTER — Telehealth: Payer: Self-pay

## 2013-05-22 DIAGNOSIS — I4891 Unspecified atrial fibrillation: Secondary | ICD-10-CM

## 2013-05-22 LAB — BASIC METABOLIC PANEL
Calcium: 9.2 mg/dL (ref 8.4–10.5)
Creatinine, Ser: 0.9 mg/dL (ref 0.4–1.2)
GFR: 63.15 mL/min (ref >60.00)
Sodium: 136 mEq/L (ref 135–145)

## 2013-05-22 NOTE — Telephone Encounter (Signed)
Received call from lab that pts Potassium level is critical at 2.8. Lab results given to Wynona Canes, Dr Fabio Bering nurse, to give to Dr Eden Emms for his review and recommendations.

## 2013-05-22 NOTE — Telephone Encounter (Signed)
SPOKE WITH  PT RE  LOW POTASSIUM  OF  2.8 PER PT  IS TAKING KCL 10 MEQ 2 TABS  QD  AND  METOLAZONE 2.5 MG  EVERY OTHER DAY  AND  FUROSEMIDE  20 MG  MOST OF THE  TIME TAKES 2 TABS   A DAY NOT  3   DISCUSSED WITH DR Eden Emms  INCREASE  KCL TO  2  TABS BID  AND RECHECK BMET  IN  1-2 WEEKS ./CY ORDER MAILED TO PT TO HAVE DONE IN [place] OR [place]./CY

## 2013-05-25 ENCOUNTER — Ambulatory Visit (INDEPENDENT_AMBULATORY_CARE_PROVIDER_SITE_OTHER): Payer: Medicare Other | Admitting: Pharmacist

## 2013-05-25 DIAGNOSIS — Z7901 Long term (current) use of anticoagulants: Secondary | ICD-10-CM

## 2013-05-25 DIAGNOSIS — I4891 Unspecified atrial fibrillation: Secondary | ICD-10-CM

## 2013-05-25 LAB — POCT INR: INR: 1.6

## 2013-06-03 ENCOUNTER — Encounter: Payer: Self-pay | Admitting: Cardiovascular Disease

## 2013-06-04 ENCOUNTER — Other Ambulatory Visit: Payer: Self-pay | Admitting: *Deleted

## 2013-06-04 MED ORDER — POTASSIUM CHLORIDE ER 10 MEQ PO TBCR: 20.0000 meq | EXTENDED_RELEASE_TABLET | Freq: Two times a day (BID) | ORAL | Status: DC

## 2013-06-09 ENCOUNTER — Ambulatory Visit (INDEPENDENT_AMBULATORY_CARE_PROVIDER_SITE_OTHER): Payer: Medicare Other | Admitting: Pharmacist

## 2013-06-09 DIAGNOSIS — Z7901 Long term (current) use of anticoagulants: Secondary | ICD-10-CM

## 2013-06-09 DIAGNOSIS — I4891 Unspecified atrial fibrillation: Secondary | ICD-10-CM

## 2013-06-09 LAB — POCT INR: INR: 2

## 2013-06-10 ENCOUNTER — Other Ambulatory Visit: Payer: Self-pay | Admitting: *Deleted

## 2013-06-10 DIAGNOSIS — I4891 Unspecified atrial fibrillation: Secondary | ICD-10-CM

## 2013-06-10 MED ORDER — METOLAZONE 2.5 MG PO TABS: 2.5000 mg | ORAL_TABLET | ORAL | Status: DC

## 2013-06-11 ENCOUNTER — Ambulatory Visit (INDEPENDENT_AMBULATORY_CARE_PROVIDER_SITE_OTHER): Payer: Medicare Other | Admitting: Family Medicine

## 2013-06-11 ENCOUNTER — Encounter: Payer: Self-pay | Admitting: Family Medicine

## 2013-06-11 VITALS — BP 146/66 | HR 68 | Ht 62.6 in | Wt 288.0 lb

## 2013-06-11 DIAGNOSIS — Z23 Encounter for immunization: Secondary | ICD-10-CM

## 2013-06-11 DIAGNOSIS — Z0000 Encounter for general adult medical examination without abnormal findings: Secondary | ICD-10-CM

## 2013-06-11 DIAGNOSIS — I10 Essential (primary) hypertension: Secondary | ICD-10-CM

## 2013-06-11 DIAGNOSIS — L570 Actinic keratosis: Secondary | ICD-10-CM

## 2013-06-11 DIAGNOSIS — E785 Hyperlipidemia, unspecified: Secondary | ICD-10-CM

## 2013-06-11 NOTE — Patient Instructions (Signed)
Keep up a regular exercise program and make sure you are eating a healthy diet Try to eat 4 servings of dairy a day, or if you are lactose intolerant take a calcium with vitamin D daily.  Your vaccines are up to date.   

## 2013-06-11 NOTE — Progress Notes (Signed)
Subjective:    Charlotte [name] is a 77 y.o. female who presents for Medicare Annual/Subsequent preventive examination.  Preventive Screening-Counseling & Management  Tobacco History  Smoking status  . Former Smoker  . Quit date: 10/08/1970  Smokeless tobacco  . Not on file     Problems Prior to Visit 1. Lesion on forearm. Had cryotherapy but lesion came back. Not painful or tichy on left post forearm.   Current Problems (verified) Patient Active Problem List   Diagnosis Date Noted  . Long term (current) use of anticoagulants 05/14/2013  . A-fib 02/24/2013  . ACTINIC KERATOSIS 11/13/2010  . SEBORRHEIC KERATOSIS, INFLAMED 11/13/2010  . VITAMIN D DEFICIENCY 10/31/2010  . VENTRICULAR TACHYCARDIA 05/31/2010  . SYNCOPE 04/24/2010  . DYSPNEA 04/24/2010  . CATARACTS 04/20/2010  . OSTEOARTHRITIS 04/20/2010  . EDEMA 02/28/2010  . MEDIAL EPICONDYLITIS 10/20/2009  . ELECTROCARDIOGRAM, ABNORMAL 09/19/2007  . OSTEOARTHROSIS NOS, LOWER LEG 01/01/2007  . HYPERLIPIDEMIA, MILD 08/13/2006  . HYPERTENSION, BENIGN SYSTEMIC 07/16/2006  . RHINITIS, ALLERGIC 07/16/2006  . GASTROESOPHAGEAL REFLUX, NO ESOPHAGITIS 07/16/2006  . IRRITABLE BOWEL SYNDROME 07/16/2006  . OSTEOPENIA 07/16/2006    Medications Prior to Visit Current Outpatient Prescriptions on File Prior to Visit  Medication Sig Dispense Refill  . Acetaminophen (TYLENOL PO) Take by mouth.      Marland Kitchen amoxicillin (AMOXIL) 500 MG capsule Take 500 mg by mouth. PRN DENTAL      . aspirin 325 MG tablet Take 325 mg by mouth daily.        . Calcium Carb-Cholecalciferol (CALCIUM 600/VITAMIN D3) 600-800 MG-UNIT TABS Take 1 tablet by mouth 2 (two) times daily.      . cetirizine (ZYRTEC) 10 MG tablet TAKE ONE TABLET BY MOUTH ONE TIME DAILY  90 tablet  0  . furosemide (LASIX) 20 MG tablet 1 TAB IN AM AND 2 TABS PM  270 tablet  3  . gabapentin (NEURONTIN) 300 MG capsule Take 300 mg by mouth 4 (four) times daily.      Marland Kitchen losartan (COZAAR) 100 MG tablet  Take 1 tablet (100 mg total) by mouth daily.  90 tablet  3  . metolazone (ZAROXOLYN) 2.5 MG tablet Take 1 tablet (2.5 mg total) by mouth every other day.  45 tablet  3  . Multiple Vitamin (MULTIVITAMIN) capsule Take 1 capsule by mouth daily.        . nebivolol (BYSTOLIC) 10 MG tablet Take 1 tablet (10 mg total) by mouth daily.  90 tablet  3  . polyvinyl alcohol-povidone (REFRESH) 1.4-0.6 % ophthalmic solution 1-2 drops as needed.      . potassium chloride (K-DUR) 10 MEQ tablet Take 2 tablets (20 mEq total) by mouth 2 (two) times daily.  180 tablet  3  . pravastatin (PRAVACHOL) 20 MG tablet Take 40 mg by mouth daily.       . verapamil (VERELAN PM) 240 MG 24 hr capsule Take 1 capsule (240 mg total) by mouth at bedtime. GENERIC ONLY  90 capsule  3  . vitamin B-12 (CYANOCOBALAMIN) 1000 MCG tablet Take 1,000 mcg by mouth daily.        Marland Kitchen warfarin (COUMADIN) 5 MG tablet Take 1 tablet (5 mg total) by mouth as directed.  105 tablet  0   No current facility-administered medications on file prior to visit.    Current Medications (verified) Current Outpatient Prescriptions  Medication Sig Dispense Refill  . Acetaminophen (TYLENOL PO) Take by mouth.      Marland Kitchen amoxicillin (AMOXIL) 500 MG capsule  Take 500 mg by mouth. PRN DENTAL      . aspirin 325 MG tablet Take 325 mg by mouth daily.        . Calcium Carb-Cholecalciferol (CALCIUM 600/VITAMIN D3) 600-800 MG-UNIT TABS Take 1 tablet by mouth 2 (two) times daily.      . cetirizine (ZYRTEC) 10 MG tablet TAKE ONE TABLET BY MOUTH ONE TIME DAILY  90 tablet  0  . furosemide (LASIX) 20 MG tablet 1 TAB IN AM AND 2 TABS PM  270 tablet  3  . gabapentin (NEURONTIN) 300 MG capsule Take 300 mg by mouth 4 (four) times daily.      Marland Kitchen losartan (COZAAR) 100 MG tablet Take 1 tablet (100 mg total) by mouth daily.  90 tablet  3  . metolazone (ZAROXOLYN) 2.5 MG tablet Take 1 tablet (2.5 mg total) by mouth every other day.  45 tablet  3  . Multiple Vitamin (MULTIVITAMIN) capsule  Take 1 capsule by mouth daily.        . nebivolol (BYSTOLIC) 10 MG tablet Take 1 tablet (10 mg total) by mouth daily.  90 tablet  3  . polyvinyl alcohol-povidone (REFRESH) 1.4-0.6 % ophthalmic solution 1-2 drops as needed.      . potassium chloride (K-DUR) 10 MEQ tablet Take 2 tablets (20 mEq total) by mouth 2 (two) times daily.  180 tablet  3  . pravastatin (PRAVACHOL) 20 MG tablet Take 40 mg by mouth daily.       . verapamil (VERELAN PM) 240 MG 24 hr capsule Take 1 capsule (240 mg total) by mouth at bedtime. GENERIC ONLY  90 capsule  3  . vitamin B-12 (CYANOCOBALAMIN) 1000 MCG tablet Take 1,000 mcg by mouth daily.        Marland Kitchen warfarin (COUMADIN) 5 MG tablet Take 1 tablet (5 mg total) by mouth as directed.  105 tablet  0   No current facility-administered medications for this visit.     Allergies (verified) Amlodipine besylate-valsartan   PAST HISTORY  Family History Family History  Problem Relation Age of Onset  . Arrhythmia Mother     afib  . Alzheimer's disease Mother   . Colon cancer Father   . Heart attack Father 40  . Diabetes Sister   . Arrhythmia Sister     afib    Social History History  Substance Use Topics  . Smoking status: Former Smoker    Quit date: 10/08/1970  . Smokeless tobacco: Not on file  . Alcohol Use: No     Are there smokers in your home (other than you)? No  Risk Factors Current exercise habits: none  Dietary issues discussed: none   Cardiac risk factors: advanced age (older than 30 for men, 90 for women).  Depression Screen (Note: if answer to either of the following is "Yes", a more complete depression screening is indicated)   Over the past two weeks, have you felt down, depressed or hopeless? No  Over the past two weeks, have you felt little interest or pleasure in doing things? No  Have you lost interest or pleasure in daily life? No  Do you often feel hopeless? No  Do you cry easily over simple problems? No  Activities of Daily  Living In your present state of health, do you have any difficulty performing the following activities?:  Driving? No Managing money?  No Feeding yourself? No Getting from bed to chair? Yes  Climbing a flight of stairs? Yes Preparing food and eating?:  No Bathing or showering? No Getting dressed: No Getting to the toilet? Yes Using the toilet:No Moving around from place to place: Yes In the past year have you fallen or had a near fall?:Yes   Are you sexually active?  No  Do you have more than one partner?  No  Hearing Difficulties: No Do you often ask people to speak up or repeat themselves? No Do you experience ringing or noises in your ears? No Do you have difficulty understanding soft or whispered voices? No   Do you feel that you have a problem with memory? No  Do you often misplace items? Yes  Do you feel safe at home?  Yes  Cognitive Testing  Alert? Yes  Normal Appearance?Yes  Oriented to person? Yes  Place? Yes   Time? Yes  Recall of three objects?  Yes  Can perform simple calculations? Yes  Displays appropriate judgment?Yes  Can read the correct time from a watch face?Yes   Advanced Directives have been discussed with the patient? Yes  List the Names of Other Physician/Practitioners you currently use: 1.  Dr. Charlton Haws  Indicate any recent Medical Services you may have received from other than [facility] providers in the past year (date may be approximate).  Immunization History  Administered Date(s) Administered  . Influenza Whole 08/07/2006, 08/05/2007, 07/08/2009, 07/25/2009, 06/30/2010  . Pneumococcal Polysaccharide 10/08/2002  . Td 07/25/2009  . Zoster 09/21/2009    Screening Tests Health Maintenance  Topic Date Due  . Influenza Vaccine  05/08/2013  . Colonoscopy  10/08/2017  . Tetanus/tdap  07/26/2019  . Pneumococcal Polysaccharide Vaccine Age 51 And Over  Completed  . Zostavax  Completed    All answers were reviewed with the patient and  necessary referrals were made:  METHENEY,CATHERINE, MD   06/11/2013   History reviewed: allergies, current medications, past family history, past medical history, past social history, past surgical history and problem list  Review of Systems A comprehensive review of systems was negative.    Objective:     Vision by Snellen chart: right eye:20/50, left eye:20/25  Body mass index is 51.67 kg/(m^2). BP 146/66  Pulse 68  Ht 5' 2.6" (1.59 m)  Wt 288 lb (130.636 kg)  BMI 51.67 kg/m2  BP 146/66  Pulse 68  Ht 5' 2.6" (1.59 m)  Wt 288 lb (130.636 kg)  BMI 51.67 kg/m2  General Appearance:    Alert, cooperative, no distress, appears stated age  Head:    Normocephalic, without obvious abnormality, atraumatic  Eyes:    PERRL, conjunctiva/corneas clear, EOM's intact, both eyes  Ears:    Normal TM's and external ear canals, both ears  Nose:   Nares normal, septum midline, mucosa normal, no drainage    or sinus tenderness  Throat:   Lips, mucosa, and tongue normal; teeth and gums normal  Neck:   Supple, symmetrical, trachea midline, no adenopathy;    thyroid:  no enlargement/tenderness/nodules; no carotid   bruit or JVD  Back:     Symmetric, no curvature, ROM normal, no CVA tenderness  Lungs:     Clear to auscultation bilaterally, respirations unlabored  Chest Wall:    No tenderness or deformity   Heart:    Regular rate and rhythm, S1 and S2 normal, no murmur, rub   or gallop  Breast Exam:    No tenderness, masses, or nipple abnormality  Abdomen:     Soft, non-tender, bowel sounds active all four quadrants,    no masses, no  organomegaly  Genitalia:    Not performed  Rectal:    Not performed.   Extremities:   Extremities normal, atraumatic, no cyanosis or edema  Pulses:   2+ and symmetric all extremities  Skin:   Skin color, texture, turgor normal, no rashes or lesions  Lymph nodes:   Cervical, supraclavicular, and axillary nodes normal  Neurologic:   CNII-XII intact, normal strength,  sensation and reflexes    throughout       Assessment:     Medicare Wellness Exam      Plan:     During the course of the visit the patient was educated and counseled about appropriate screening and preventive services including:    Influenza vaccine  Lesion on left forearm.   Diet review for nutrition referral? Yes ____  Not Indicated __x_   Patient Instructions (the written plan) was given to the patient.  Medicare Attestation I have personally reviewed: The patient's medical and social history Their use of alcohol, tobacco or illicit drugs Their current medications and supplements The patient's functional ability including ADLs,fall risks, home safety risks, cognitive, and hearing and visual impairment Diet and physical activities Evidence for depression or mood disorders  The patient's weight, height, BMI, and visual acuity have been recorded in the chart.  I have made referrals, counseling, and provided education to the patient based on review of the above and I have provided the patient with a written personalized care plan for preventive services.     METHENEY,CATHERINE, MD   06/11/2013      Shave Biopsy Procedure Note  Pre-operative Diagnosis: Actinic keratosis  Post-operative Diagnosis: normal  Locations:left post forearm  Indications: r/o squamous cell  Anesthesia: Lidocaine 1% with epinephrine without added sodium bicarbonate  Procedure Details  History of allergy to iodine: no  Patient informed of the risks (including bleeding and infection) and benefits of the  procedure and Verbal informed consent obtained.  The lesion and surrounding area were given a sterile prep using betadyne and draped in the usual sterile fashion. A scalpel was used to shave an area of skin approximately 1cm by 0.5cm.  Hemostasis achieved with alumuninum chloride. Antibiotic ointment and a sterile dressing applied.  The specimen was sent for pathologic examination. The  patient tolerated the procedure well.  EBL: 0 ml  Findings: Await pathology  Condition: Stable  Complications: none.  Plan: 1. Instructed to keep the wound dry and covered for 24-48h and clean thereafter.Apply vaseline daily.  2. Warning signs of infection were reviewed.   3. Recommended that the patient use OTC acetaminophen as needed for pain.  4. Return prn

## 2013-06-19 LAB — COMPLETE METABOLIC PANEL WITH GFR
Alkaline Phosphatase: 63 U/L (ref 39–117)
BUN: 22 mg/dL (ref 6–23)
GFR, Est Non African American: 75 mL/min
Glucose, Bld: 107 mg/dL — ABNORMAL HIGH (ref 70–99)
Total Bilirubin: 0.8 mg/dL (ref 0.3–1.2)

## 2013-06-19 LAB — HEMOGLOBIN A1C: Mean Plasma Glucose: 134 mg/dL — ABNORMAL HIGH (ref <117)

## 2013-06-19 LAB — LIPID PANEL
Cholesterol: 169 mg/dL (ref 0–200)
HDL: 53 mg/dL (ref >39)
Total CHOL/HDL Ratio: 3.2 Ratio
Triglycerides: 109 mg/dL (ref <150)

## 2013-06-23 ENCOUNTER — Ambulatory Visit (INDEPENDENT_AMBULATORY_CARE_PROVIDER_SITE_OTHER): Payer: Medicare Other | Admitting: Pharmacist

## 2013-06-23 DIAGNOSIS — Z7901 Long term (current) use of anticoagulants: Secondary | ICD-10-CM

## 2013-06-23 DIAGNOSIS — I4891 Unspecified atrial fibrillation: Secondary | ICD-10-CM

## 2013-06-23 MED ORDER — POTASSIUM CHLORIDE ER 10 MEQ PO TBCR: 20.0000 meq | EXTENDED_RELEASE_TABLET | Freq: Two times a day (BID) | ORAL | Status: DC

## 2013-07-07 ENCOUNTER — Ambulatory Visit (INDEPENDENT_AMBULATORY_CARE_PROVIDER_SITE_OTHER): Payer: Medicare Other

## 2013-07-07 DIAGNOSIS — I4891 Unspecified atrial fibrillation: Secondary | ICD-10-CM

## 2013-07-07 DIAGNOSIS — Z7901 Long term (current) use of anticoagulants: Secondary | ICD-10-CM

## 2013-07-15 ENCOUNTER — Other Ambulatory Visit: Payer: Self-pay | Admitting: Family Medicine

## 2013-07-15 ENCOUNTER — Encounter: Payer: Self-pay | Admitting: Cardiovascular Disease

## 2013-07-15 DIAGNOSIS — Z1231 Encounter for screening mammogram for malignant neoplasm of breast: Secondary | ICD-10-CM

## 2013-07-20 ENCOUNTER — Telehealth: Payer: Self-pay | Admitting: Cardiovascular Disease

## 2013-07-20 NOTE — Telephone Encounter (Signed)
New Problem:  Pt states she is returning Christine's call from Friday.

## 2013-07-20 NOTE — Telephone Encounter (Signed)
PT AWARE OF LAB RESULTS./CY 

## 2013-07-20 NOTE — Telephone Encounter (Signed)
BUSY   SIGNAL X3   AFTER   I CALLED  AND SOMEONE ANSWERED AND STATED HAD WRONG NUMBER .Zack Seal

## 2013-07-27 ENCOUNTER — Ambulatory Visit (INDEPENDENT_AMBULATORY_CARE_PROVIDER_SITE_OTHER): Payer: Medicare Other | Admitting: Cardiovascular Disease

## 2013-07-27 ENCOUNTER — Encounter: Payer: Self-pay | Admitting: Cardiovascular Disease

## 2013-07-27 ENCOUNTER — Ambulatory Visit (INDEPENDENT_AMBULATORY_CARE_PROVIDER_SITE_OTHER): Payer: Medicare Other | Admitting: Pharmacist

## 2013-07-27 VITALS — BP 138/78 | HR 80 | Ht 64.0 in | Wt 287.8 lb

## 2013-07-27 DIAGNOSIS — I4891 Unspecified atrial fibrillation: Secondary | ICD-10-CM

## 2013-07-27 DIAGNOSIS — Z7901 Long term (current) use of anticoagulants: Secondary | ICD-10-CM

## 2013-07-27 DIAGNOSIS — I10 Essential (primary) hypertension: Secondary | ICD-10-CM

## 2013-07-27 DIAGNOSIS — E785 Hyperlipidemia, unspecified: Secondary | ICD-10-CM

## 2013-07-27 NOTE — Assessment & Plan Note (Signed)
Well controlled.  Continue current medications and low sodium Dash type diet.    

## 2013-07-27 NOTE — Assessment & Plan Note (Signed)
Cholesterol is at goal.  Continue current dose of statin and diet Rx.  No myalgias or side effects.  F/U  LFT's in 6 months. Lab Results  Component Value Date   LDLCALC 94 06/19/2013             

## 2013-07-27 NOTE — Patient Instructions (Signed)
Your physician wants you to follow-up in:  6 MONTHS WITH DR NISHAN  You will receive a reminder letter in the mail two months in advance. If you don't receive a letter, please call our office to schedule the follow-up appointment. Your physician recommends that you continue on your current medications as directed. Please refer to the Current Medication list given to you today. 

## 2013-07-27 NOTE — Assessment & Plan Note (Signed)
Maint NSR  Consider stopping coumadin in 6 months  F/U coumadin clinic No bleeding issues

## 2013-07-27 NOTE — Progress Notes (Signed)
Patient ID: Charlotte [name], female   DOB: Oct 20, 1935, 77 y.o.   MRN: [id number] She has a history of LBBB, HL, HTN. Dobutamine echocardiogram done in 06/2010 for syncope and demonstrated nonsustained ventricular tachycardia. EF at that time was normal. LHC 06/2010: EF 55%, mild plaque in the LAD 20-30%, otherwise normal coronary arteries. She was recently seen by Dr. Eden Emms and noted to be in atrial fibrillation. Echocardiogram 02/2013: Moderate LVH, EF 25-30%, anteroseptal and apical HK, moderate AI, MAC, moderate MR, moderate LAE, mild RAE, PASP 32. Patient was placed on Xarelto for anticoagulation. She underwent DCCV 04/01/13 after several weeks of uninterrupted treatment with Xarelto. She is doing well. Notes less dyspnea. She reports NYHA Class IIb symptoms. No orthopnea, PND. LE edema is improved. No CP, syncope or palpitations.   Changed to coumadin recently due to cost    ROS: Denies fever, malais, weight loss, blurry vision, decreased visual acuity, cough, sputum, SOB, hemoptysis, pleuritic pain, palpitaitons, heartburn, abdominal pain, melena, lower extremity edema, claudication, or rash.  All other systems reviewed and negative  General: Affect appropriate Healthy:  appears stated age HEENT: normal Neck supple with no adenopathy JVP normal no bruits no thyromegaly Lungs clear with no wheezing and good diaphragmatic motion Heart:  S1/S2 no murmur, no rub, gallop or click PMI normal Abdomen: benighn, BS positve, no tenderness, no AAA no bruit.  No HSM or HJR Distal pulses intact with no bruits No edema Neuro non-focal Skin warm and dry No muscular weakness   Current Outpatient Prescriptions  Medication Sig Dispense Refill  . Acetaminophen (TYLENOL PO) Take by mouth.      Marland Kitchen amoxicillin (AMOXIL) 500 MG capsule Take 500 mg by mouth. PRN DENTAL      . aspirin 325 MG tablet Take 325 mg by mouth daily.        . Calcium Carb-Cholecalciferol (CALCIUM 600/VITAMIN D3) 600-800 MG-UNIT TABS Take 1  tablet by mouth 2 (two) times daily.      . cetirizine (ZYRTEC) 10 MG tablet TAKE ONE TABLET BY MOUTH ONE TIME DAILY  90 tablet  0  . furosemide (LASIX) 20 MG tablet 1 TAB IN AM AND 2 TABS PM  270 tablet  3  . gabapentin (NEURONTIN) 300 MG capsule Take 300 mg by mouth 3 (three) times daily.       Marland Kitchen losartan (COZAAR) 100 MG tablet Take 1 tablet (100 mg total) by mouth daily.  90 tablet  3  . metolazone (ZAROXOLYN) 2.5 MG tablet Take 1 tablet (2.5 mg total) by mouth every other day.  45 tablet  3  . Multiple Vitamin (MULTIVITAMIN) capsule Take 1 capsule by mouth daily.        . nebivolol (BYSTOLIC) 10 MG tablet Take 1 tablet (10 mg total) by mouth daily.  90 tablet  3  . polyvinyl alcohol-povidone (REFRESH) 1.4-0.6 % ophthalmic solution 1-2 drops as needed.      . potassium chloride (K-DUR) 10 MEQ tablet Take 2 tablets (20 mEq total) by mouth 2 (two) times daily.  360 tablet  3  . pravastatin (PRAVACHOL) 20 MG tablet Take 40 mg by mouth daily.       . verapamil (VERELAN PM) 240 MG 24 hr capsule Take 1 capsule (240 mg total) by mouth at bedtime. GENERIC ONLY  90 capsule  3  . vitamin B-12 (CYANOCOBALAMIN) 1000 MCG tablet Take 1,000 mcg by mouth daily.        Marland Kitchen warfarin (COUMADIN) 5 MG tablet Take 1 tablet (  5 mg total) by mouth as directed.  105 tablet  0   No current facility-administered medications for this visit.    Allergies  Amlodipine besylate-valsartan  Electrocardiogram: 7.24 SR rate 57 first degree block LAD ? Old anterior MI  Assessment and Plan

## 2013-08-06 ENCOUNTER — Telehealth: Payer: Self-pay | Admitting: Cardiovascular Disease

## 2013-08-06 NOTE — Telephone Encounter (Signed)
Can try coreg 6.25 bid

## 2013-08-06 NOTE — Telephone Encounter (Signed)
WILL FORWARD TO DR Eden Emms FOR  HIS REVIEW .Charlotte [name]

## 2013-08-06 NOTE — Telephone Encounter (Signed)
New message    On bystolic--ins will not cover for 2015----what other rx can you presc

## 2013-08-06 NOTE — Telephone Encounter (Signed)
PT  AWARE  WILL CALL  WHEN IS NEEDING  SCRIPT  HAS  APPROX  90 DAY   SUPPLY OF  THE  BYSTOLIC  LEFT./CY

## 2013-08-12 ENCOUNTER — Other Ambulatory Visit: Payer: Self-pay | Admitting: Family Medicine

## 2013-08-12 ENCOUNTER — Other Ambulatory Visit: Payer: Self-pay

## 2013-08-12 MED ORDER — POTASSIUM CHLORIDE ER 10 MEQ PO TBCR: 20.0000 meq | EXTENDED_RELEASE_TABLET | Freq: Two times a day (BID) | ORAL | Status: DC

## 2013-08-13 ENCOUNTER — Ambulatory Visit (INDEPENDENT_AMBULATORY_CARE_PROVIDER_SITE_OTHER): Payer: Medicare Other

## 2013-08-13 ENCOUNTER — Telehealth: Payer: Self-pay

## 2013-08-13 DIAGNOSIS — Z1231 Encounter for screening mammogram for malignant neoplasm of breast: Secondary | ICD-10-CM

## 2013-08-13 NOTE — Telephone Encounter (Signed)
I spoke with the patient. I advised I do not see where Wynona Canes has tried to call her yesterday. The patient states she did receive a call yesterday. She reports she called earlier about a potassium refill. I advised this was sent in yesterday and that someone from refills may have tried to call her back about that. Otherwise I do not see anything. Advised I will forward to [place]. Patient states she can call her back if she needs to.

## 2013-08-13 NOTE — Telephone Encounter (Signed)
New problem    Returning call back to nurse.   

## 2013-08-17 ENCOUNTER — Ambulatory Visit (INDEPENDENT_AMBULATORY_CARE_PROVIDER_SITE_OTHER): Payer: Medicare Other | Admitting: Pharmacist

## 2013-08-17 DIAGNOSIS — I4891 Unspecified atrial fibrillation: Secondary | ICD-10-CM

## 2013-08-17 DIAGNOSIS — Z7901 Long term (current) use of anticoagulants: Secondary | ICD-10-CM

## 2013-08-17 MED ORDER — WARFARIN SODIUM 5 MG PO TABS: 5.0000 mg | ORAL_TABLET | ORAL | Status: DC

## 2013-09-07 ENCOUNTER — Ambulatory Visit (INDEPENDENT_AMBULATORY_CARE_PROVIDER_SITE_OTHER): Payer: Medicare Other | Admitting: General Practice

## 2013-09-07 DIAGNOSIS — I4891 Unspecified atrial fibrillation: Secondary | ICD-10-CM

## 2013-09-07 DIAGNOSIS — Z7901 Long term (current) use of anticoagulants: Secondary | ICD-10-CM

## 2013-09-07 LAB — POCT INR: INR: 2.7

## 2013-10-05 ENCOUNTER — Ambulatory Visit (INDEPENDENT_AMBULATORY_CARE_PROVIDER_SITE_OTHER): Payer: Medicare Other

## 2013-10-05 DIAGNOSIS — I4891 Unspecified atrial fibrillation: Secondary | ICD-10-CM

## 2013-10-05 DIAGNOSIS — Z7901 Long term (current) use of anticoagulants: Secondary | ICD-10-CM

## 2013-10-05 MED ORDER — WARFARIN SODIUM 5 MG PO TABS: ORAL_TABLET | ORAL | Status: DC

## 2013-11-02 ENCOUNTER — Ambulatory Visit (INDEPENDENT_AMBULATORY_CARE_PROVIDER_SITE_OTHER): Payer: Medicare HMO

## 2013-11-02 DIAGNOSIS — I4891 Unspecified atrial fibrillation: Secondary | ICD-10-CM

## 2013-11-02 DIAGNOSIS — Z5181 Encounter for therapeutic drug level monitoring: Secondary | ICD-10-CM

## 2013-11-02 DIAGNOSIS — Z7901 Long term (current) use of anticoagulants: Secondary | ICD-10-CM

## 2013-11-02 LAB — POCT INR: INR: 2.1

## 2013-11-17 ENCOUNTER — Other Ambulatory Visit: Payer: Self-pay | Admitting: *Deleted

## 2013-11-17 DIAGNOSIS — I4891 Unspecified atrial fibrillation: Secondary | ICD-10-CM

## 2013-11-17 MED ORDER — METOLAZONE 2.5 MG PO TABS: 2.5000 mg | ORAL_TABLET | ORAL | Status: DC

## 2013-11-17 MED ORDER — LOSARTAN POTASSIUM 100 MG PO TABS: 100.0000 mg | ORAL_TABLET | Freq: Every day | ORAL | Status: DC

## 2013-11-17 MED ORDER — POTASSIUM CHLORIDE ER 10 MEQ PO TBCR: 20.0000 meq | EXTENDED_RELEASE_TABLET | Freq: Two times a day (BID) | ORAL | Status: DC

## 2013-11-27 ENCOUNTER — Other Ambulatory Visit: Payer: Self-pay | Admitting: *Deleted

## 2013-11-27 MED ORDER — PRAVASTATIN SODIUM 20 MG PO TABS: 40.0000 mg | ORAL_TABLET | Freq: Every day | ORAL | Status: DC

## 2013-11-30 ENCOUNTER — Telehealth: Payer: Self-pay | Admitting: *Deleted

## 2013-11-30 ENCOUNTER — Ambulatory Visit (INDEPENDENT_AMBULATORY_CARE_PROVIDER_SITE_OTHER): Payer: Medicare HMO

## 2013-11-30 DIAGNOSIS — I4891 Unspecified atrial fibrillation: Secondary | ICD-10-CM

## 2013-11-30 DIAGNOSIS — Z5181 Encounter for therapeutic drug level monitoring: Secondary | ICD-10-CM

## 2013-11-30 DIAGNOSIS — Z7901 Long term (current) use of anticoagulants: Secondary | ICD-10-CM

## 2013-11-30 LAB — POCT INR: INR: 2.3

## 2013-11-30 NOTE — Telephone Encounter (Signed)
Prior auth approved for pravastatin 20mg . Auth good until 11/30/14.

## 2013-12-09 ENCOUNTER — Encounter: Payer: Self-pay | Admitting: Family Medicine

## 2013-12-09 ENCOUNTER — Ambulatory Visit (INDEPENDENT_AMBULATORY_CARE_PROVIDER_SITE_OTHER): Payer: Medicare HMO | Admitting: Family Medicine

## 2013-12-09 VITALS — BP 123/60 | HR 63 | Ht 63.6 in | Wt 286.0 lb

## 2013-12-09 DIAGNOSIS — R7301 Impaired fasting glucose: Secondary | ICD-10-CM

## 2013-12-09 DIAGNOSIS — E669 Obesity, unspecified: Secondary | ICD-10-CM | POA: Insufficient documentation

## 2013-12-09 DIAGNOSIS — Z1211 Encounter for screening for malignant neoplasm of colon: Secondary | ICD-10-CM

## 2013-12-09 DIAGNOSIS — M25569 Pain in unspecified knee: Secondary | ICD-10-CM

## 2013-12-09 DIAGNOSIS — L259 Unspecified contact dermatitis, unspecified cause: Secondary | ICD-10-CM

## 2013-12-09 DIAGNOSIS — E1169 Type 2 diabetes mellitus with other specified complication: Secondary | ICD-10-CM | POA: Insufficient documentation

## 2013-12-09 DIAGNOSIS — L309 Dermatitis, unspecified: Secondary | ICD-10-CM

## 2013-12-09 DIAGNOSIS — I4891 Unspecified atrial fibrillation: Secondary | ICD-10-CM

## 2013-12-09 DIAGNOSIS — I10 Essential (primary) hypertension: Secondary | ICD-10-CM

## 2013-12-09 LAB — POCT GLYCOSYLATED HEMOGLOBIN (HGB A1C): Hemoglobin A1C: 6.1

## 2013-12-09 MED ORDER — TRIAMCINOLONE ACETONIDE 0.5 % EX OINT: 1.0000 "application " | TOPICAL_OINTMENT | Freq: Two times a day (BID) | CUTANEOUS | Status: DC

## 2013-12-09 NOTE — Patient Instructions (Signed)
Wash with a gentle moisturizing soap. Pat dry the skin Then apply the prescription cortisone cream, and then apply Eucerin cream on top.   Apply the Eucerin twice a day.

## 2013-12-09 NOTE — Progress Notes (Signed)
   Subjective:    Patient ID: Charlotte [name], female    DOB: 03/23/1936, 78 y.o.   MRN: [id number]  HPI Hypertension- Pt denies chest pain, SOB, dizziness, or heart palpitations.  Taking meds as directed w/o problems.  Denies medication side effects.    Red and itching legs x2-3 months. Used her daughters prescription cortisone cream and that really helped.  She has been using an over-the-counter lotion, it but doesn't really seem to help. No known triggers. No worsening or alleviating factors.  Atrial fibrillation-occasionally get short of breath with activity such as walking to the mailbox if she doesn't have time to stop and rest. She says usually it's very brief. She is on Coumadin and followed at cardiology for this. Review of Systems     Objective:   Physical Exam  Constitutional: She is oriented to person, place, and time. She appears well-developed and well-nourished.  HENT:  Head: Normocephalic and atraumatic.  Cardiovascular: Normal rate, regular rhythm and normal heart sounds.   No carotid bruit  Pulmonary/Chest: Effort normal and breath sounds normal.  Neurological: She is alert and oriented to person, place, and time.  Skin: Skin is warm and dry.  Dry scaling skin on LE with some excoriations   Psychiatric: She has a normal mood and affect. Her behavior is normal.          Assessment & Plan:  HTN- well controlled.  Continue current regimen. Followup in 6 months.  IFG - stable. Continue work on exercise diet and weight loss. will follow every 6 months.   Eczema - discussed moisturizing and will use topical steroid cream.  See information provided.    Afib- on coumadin and followed by cardiology.  Occ feel SOb with sudden activity    Needs referral to GI for screening colonoscopy.    Nees to see ortho for her knees. Will place new referral.

## 2013-12-11 ENCOUNTER — Telehealth: Payer: Self-pay | Admitting: *Deleted

## 2013-12-11 NOTE — Telephone Encounter (Signed)
Pt informed that insurance will no longer cover pravastatin. She is ok with any change that has to be made.Charlotte [name]

## 2013-12-12 LAB — BASIC METABOLIC PANEL WITH GFR
BUN: 35 mg/dL — ABNORMAL HIGH (ref 6–23)
CO2: 31 mEq/L (ref 19–32)
Calcium: 9.7 mg/dL (ref 8.4–10.5)
Chloride: 99 mEq/L (ref 96–112)
Creat: 0.8 mg/dL (ref 0.50–1.10)
GFR, EST AFRICAN AMERICAN: 83 mL/min
GFR, EST NON AFRICAN AMERICAN: 72 mL/min
Glucose, Bld: 80 mg/dL (ref 70–99)
POTASSIUM: 3.5 meq/L (ref 3.5–5.3)
SODIUM: 138 meq/L (ref 135–145)

## 2013-12-14 MED ORDER — LOVASTATIN 40 MG PO TABS: 40.0000 mg | ORAL_TABLET | Freq: Every day | ORAL | Status: DC

## 2013-12-14 NOTE — Telephone Encounter (Signed)
New rx sent

## 2013-12-14 NOTE — Progress Notes (Signed)
Quick Note:  All labs are normal. ______ 

## 2014-01-11 ENCOUNTER — Ambulatory Visit (INDEPENDENT_AMBULATORY_CARE_PROVIDER_SITE_OTHER): Payer: Commercial Managed Care - HMO

## 2014-01-11 DIAGNOSIS — Z5181 Encounter for therapeutic drug level monitoring: Secondary | ICD-10-CM

## 2014-01-11 DIAGNOSIS — I4891 Unspecified atrial fibrillation: Secondary | ICD-10-CM | POA: Diagnosis not present

## 2014-01-11 DIAGNOSIS — Z7901 Long term (current) use of anticoagulants: Secondary | ICD-10-CM

## 2014-01-11 LAB — POCT INR: INR: 2.1

## 2014-01-13 ENCOUNTER — Other Ambulatory Visit: Payer: Self-pay | Admitting: Cardiovascular Disease

## 2014-01-22 ENCOUNTER — Other Ambulatory Visit: Payer: Self-pay | Admitting: *Deleted

## 2014-01-22 MED ORDER — CARVEDILOL 6.25 MG PO TABS
6.2500 mg | ORAL_TABLET | Freq: Two times a day (BID) | ORAL | Status: DC
Start: 2014-01-22 — End: 2014-04-16

## 2014-01-22 MED ORDER — VERAPAMIL HCL ER 240 MG PO CP24: 240.0000 mg | ORAL_CAPSULE | Freq: Every day | ORAL | Status: DC

## 2014-02-22 ENCOUNTER — Ambulatory Visit (INDEPENDENT_AMBULATORY_CARE_PROVIDER_SITE_OTHER): Payer: Medicare HMO | Admitting: Surgery

## 2014-02-22 DIAGNOSIS — Z5181 Encounter for therapeutic drug level monitoring: Secondary | ICD-10-CM

## 2014-02-22 DIAGNOSIS — Z7901 Long term (current) use of anticoagulants: Secondary | ICD-10-CM

## 2014-02-22 DIAGNOSIS — I4891 Unspecified atrial fibrillation: Secondary | ICD-10-CM

## 2014-02-22 LAB — POCT INR: INR: 1.4

## 2014-03-03 ENCOUNTER — Ambulatory Visit (INDEPENDENT_AMBULATORY_CARE_PROVIDER_SITE_OTHER): Payer: Commercial Managed Care - HMO | Admitting: Surgery

## 2014-03-03 ENCOUNTER — Encounter: Payer: Self-pay | Admitting: Cardiovascular Disease

## 2014-03-03 ENCOUNTER — Ambulatory Visit (INDEPENDENT_AMBULATORY_CARE_PROVIDER_SITE_OTHER): Payer: Commercial Managed Care - HMO | Admitting: Cardiovascular Disease

## 2014-03-03 VITALS — BP 150/90 | HR 71 | Ht 63.0 in | Wt 285.0 lb

## 2014-03-03 DIAGNOSIS — R609 Edema, unspecified: Secondary | ICD-10-CM

## 2014-03-03 DIAGNOSIS — Z5181 Encounter for therapeutic drug level monitoring: Secondary | ICD-10-CM

## 2014-03-03 DIAGNOSIS — I10 Essential (primary) hypertension: Secondary | ICD-10-CM | POA: Diagnosis not present

## 2014-03-03 DIAGNOSIS — I4891 Unspecified atrial fibrillation: Secondary | ICD-10-CM

## 2014-03-03 DIAGNOSIS — I351 Nonrheumatic aortic (valve) insufficiency: Secondary | ICD-10-CM

## 2014-03-03 DIAGNOSIS — Z7901 Long term (current) use of anticoagulants: Secondary | ICD-10-CM | POA: Diagnosis not present

## 2014-03-03 DIAGNOSIS — I359 Nonrheumatic aortic valve disorder, unspecified: Secondary | ICD-10-CM

## 2014-03-03 LAB — POCT INR: INR: 1.5

## 2014-03-03 NOTE — Assessment & Plan Note (Signed)
Well controlled.  Continue current medications and low sodium Dash type diet.    

## 2014-03-03 NOTE — Assessment & Plan Note (Signed)
Maint NSR post DCC with improved LV funciton

## 2014-03-03 NOTE — Patient Instructions (Signed)
Your physician recommends that you schedule a follow-up appointment in: AUGUST WITH  DR   Eden Emms  WITH   ECHO  SAME  DAY  Your physician recommends that you continue on your current medications as directed. Please refer to the Current Medication list given to you today. Your physician has requested that you have an echocardiogram. Echocardiography is a painless test that uses sound waves to create images of your heart. It provides your doctor with information about the size and shape of your heart and how well your heart's chambers and valves are working. This procedure takes approximately one hour. There are no restrictions for this procedure.  DUE IN [place].

## 2014-03-03 NOTE — Assessment & Plan Note (Signed)
Dependant from varicosities and obesity continue lasix and kenelog cream  No evidence of cellulitis

## 2014-03-03 NOTE — Assessment & Plan Note (Signed)
Moderate Hard to hear on exam due to her body size.  F/U echo August.  Continue diuretic and ACE

## 2014-03-03 NOTE — Progress Notes (Addendum)
Patient ID: Charlotte [name], female   DOB: 1936/02/12, 78 y.o.   MRN: [id number] She has a history of LBBB, HL, HTN. Dobutamine echocardiogram done in 06/2010 for syncope and demonstrated nonsustained ventricular tachycardia. EF at that time was normal. LHC 06/2010: EF 55%, mild plaque in the LAD 20-30%, otherwise normal coronary arteries. Atrial fibrillation noted 2014 Echocardiogram 02/2013: Moderate LVH, EF 25-30%, anteroseptal and apical HK, moderate AI, MAC, moderate MR, moderate LAE, mild RAE, PASP 32. Patient was placed on Xarelto for anticoagulation. She underwent DCCV 04/01/13 after several weeks of uninterrupted treatment with Xarelto. She is doing well. Notes less dyspnea. She reports NYHA Class IIb symptoms. No orthopnea, PND. LE edema is improved. No CP, syncope or palpitations.   Changed to coumadin recently due to cost  F/U echo showed moderate AR and return to normal LV function  8/14 Study Conclusions  - Left ventricle: The cavity size was normal. Wall thickness was increased in a pattern of moderate LVH. Systolic function was normal. The estimated ejection fraction was in the range of 60% to 65%. Wall motion was normal; there were no regional wall motion abnormalities. Doppler parameters are consistent with abnormal left ventricular relaxation (grade 1 diastolic dysfunction). - Aortic valve: Moderate regurgitation. - Left atrium: The atrium was moderately dilated.   Some ectopic excema from edema in LE's  Given doxycycline fut doubt cellulitis Kenelog lotion works better   ROS: Denies fever, malais, weight loss, blurry vision, decreased visual acuity, cough, sputum, SOB, hemoptysis, pleuritic pain, palpitaitons, heartburn, abdominal pain, melena, lower extremity edema, claudication, or rash.  All other systems reviewed and negative  General: Affect appropriate Healthy:  appears stated age HEENT: normal Neck supple with no adenopathy JVP normal no bruits no thyromegaly Lungs clear  with no wheezing and good diaphragmatic motion Heart:  S1/S2 AR  murmur, no rub, gallop or click PMI normal Abdomen: benighn, BS positve, no tenderness, no AAA no bruit.  No HSM or HJR Distal pulses intact with no bruits Plus one bilateral  edema Neuro non-focal Skin warm and dry No muscular weakness   Current Outpatient Prescriptions  Medication Sig Dispense Refill  . Acetaminophen (TYLENOL PO) Take by mouth.      . Calcium Carb-Cholecalciferol (CALCIUM 600/VITAMIN D3) 600-800 MG-UNIT TABS Take 1 tablet by mouth 2 (two) times daily.      . carvedilol (COREG) 6.25 MG tablet Take 1 tablet (6.25 mg total) by mouth 2 (two) times daily.  180 tablet  0  . cetirizine (ZYRTEC) 10 MG tablet TAKE ONE TABLET BY MOUTH ONE TIME DAILY   90 tablet  1  . furosemide (LASIX) 20 MG tablet 1 TAB IN AM AND 2 TABS PM  270 tablet  3  . gabapentin (NEURONTIN) 300 MG capsule Take 300 mg by mouth 3 (three) times daily.       Marland Kitchen losartan (COZAAR) 100 MG tablet TAKE 1 TABLET EVERY DAY  90 tablet  0  . metolazone (ZAROXOLYN) 2.5 MG tablet Take 1 tablet (2.5 mg total) by mouth every other day.  45 tablet  0  . Multiple Vitamin (MULTIVITAMIN) capsule Take 1 capsule by mouth daily.        . polyvinyl alcohol-povidone (REFRESH) 1.4-0.6 % ophthalmic solution 1-2 drops as needed.      . potassium chloride (K-DUR) 10 MEQ tablet Take 2 tablets (20 mEq total) by mouth 2 (two) times daily.  360 tablet  0  . triamcinolone ointment (KENALOG) 0.5 % Apply 1 application topically  2 (two) times daily.  30 g  1  . verapamil (VERELAN PM) 240 MG 24 hr capsule Take 1 capsule (240 mg total) by mouth at bedtime. GENERIC ONLY  90 capsule  0  . vitamin B-12 (CYANOCOBALAMIN) 1000 MCG tablet Take 1,000 mcg by mouth daily.        Marland Kitchen. warfarin (COUMADIN) 5 MG tablet Take as directed by Anticoagulation clinic  135 tablet  1   No current facility-administered medications for this visit.    Allergies  Amlodipine  besylate-valsartan  Electrocardiogram:  SR rate 71 poor R wave progression cannot r/o old anterolateral MI   Assessment and Plan

## 2014-03-09 ENCOUNTER — Other Ambulatory Visit: Payer: Self-pay | Admitting: *Deleted

## 2014-03-09 MED ORDER — POTASSIUM CHLORIDE ER 10 MEQ PO TBCR: 20.0000 meq | EXTENDED_RELEASE_TABLET | Freq: Two times a day (BID) | ORAL | Status: DC

## 2014-03-11 ENCOUNTER — Other Ambulatory Visit: Payer: Self-pay | Admitting: Cardiovascular Disease

## 2014-03-12 ENCOUNTER — Ambulatory Visit (INDEPENDENT_AMBULATORY_CARE_PROVIDER_SITE_OTHER): Payer: Commercial Managed Care - HMO | Admitting: Pharmacist

## 2014-03-12 DIAGNOSIS — Z5181 Encounter for therapeutic drug level monitoring: Secondary | ICD-10-CM

## 2014-03-12 DIAGNOSIS — Z7901 Long term (current) use of anticoagulants: Secondary | ICD-10-CM

## 2014-03-12 DIAGNOSIS — I4891 Unspecified atrial fibrillation: Secondary | ICD-10-CM

## 2014-03-12 LAB — POCT INR: INR: 2

## 2014-03-26 ENCOUNTER — Ambulatory Visit (INDEPENDENT_AMBULATORY_CARE_PROVIDER_SITE_OTHER): Payer: Commercial Managed Care - HMO | Admitting: Pharmacist

## 2014-03-26 DIAGNOSIS — Z7901 Long term (current) use of anticoagulants: Secondary | ICD-10-CM

## 2014-03-26 DIAGNOSIS — Z5181 Encounter for therapeutic drug level monitoring: Secondary | ICD-10-CM | POA: Diagnosis not present

## 2014-03-26 DIAGNOSIS — I4891 Unspecified atrial fibrillation: Secondary | ICD-10-CM

## 2014-03-26 LAB — POCT INR: INR: 2.3

## 2014-03-30 DIAGNOSIS — G603 Idiopathic progressive neuropathy: Secondary | ICD-10-CM | POA: Insufficient documentation

## 2014-03-30 DIAGNOSIS — M48061 Spinal stenosis, lumbar region without neurogenic claudication: Secondary | ICD-10-CM | POA: Insufficient documentation

## 2014-03-30 DIAGNOSIS — G2581 Restless legs syndrome: Secondary | ICD-10-CM | POA: Insufficient documentation

## 2014-04-16 ENCOUNTER — Ambulatory Visit (INDEPENDENT_AMBULATORY_CARE_PROVIDER_SITE_OTHER): Payer: Commercial Managed Care - HMO | Admitting: *Deleted

## 2014-04-16 ENCOUNTER — Other Ambulatory Visit: Payer: Self-pay | Admitting: *Deleted

## 2014-04-16 DIAGNOSIS — Z7901 Long term (current) use of anticoagulants: Secondary | ICD-10-CM

## 2014-04-16 DIAGNOSIS — I4891 Unspecified atrial fibrillation: Secondary | ICD-10-CM

## 2014-04-16 DIAGNOSIS — Z5181 Encounter for therapeutic drug level monitoring: Secondary | ICD-10-CM

## 2014-04-16 LAB — POCT INR: INR: 3.4

## 2014-04-16 MED ORDER — FUROSEMIDE 20 MG PO TABS: ORAL_TABLET | ORAL | Status: DC

## 2014-04-16 MED ORDER — VERAPAMIL HCL ER 240 MG PO CP24: 240.0000 mg | ORAL_CAPSULE | Freq: Every day | ORAL | Status: DC

## 2014-04-16 MED ORDER — CARVEDILOL 6.25 MG PO TABS: 6.2500 mg | ORAL_TABLET | Freq: Two times a day (BID) | ORAL | Status: DC

## 2014-04-16 MED ORDER — LOSARTAN POTASSIUM 100 MG PO TABS: ORAL_TABLET | ORAL | Status: DC

## 2014-04-30 ENCOUNTER — Ambulatory Visit (INDEPENDENT_AMBULATORY_CARE_PROVIDER_SITE_OTHER): Payer: Commercial Managed Care - HMO

## 2014-04-30 DIAGNOSIS — Z7901 Long term (current) use of anticoagulants: Secondary | ICD-10-CM

## 2014-04-30 DIAGNOSIS — I4891 Unspecified atrial fibrillation: Secondary | ICD-10-CM

## 2014-04-30 DIAGNOSIS — Z5181 Encounter for therapeutic drug level monitoring: Secondary | ICD-10-CM

## 2014-04-30 LAB — POCT INR: INR: 2.4

## 2014-05-21 ENCOUNTER — Ambulatory Visit (INDEPENDENT_AMBULATORY_CARE_PROVIDER_SITE_OTHER): Payer: Commercial Managed Care - HMO | Admitting: *Deleted

## 2014-05-21 DIAGNOSIS — I4891 Unspecified atrial fibrillation: Secondary | ICD-10-CM

## 2014-05-21 DIAGNOSIS — Z5181 Encounter for therapeutic drug level monitoring: Secondary | ICD-10-CM

## 2014-05-21 DIAGNOSIS — Z7901 Long term (current) use of anticoagulants: Secondary | ICD-10-CM

## 2014-05-21 LAB — POCT INR: INR: 2.3

## 2014-06-02 ENCOUNTER — Ambulatory Visit (INDEPENDENT_AMBULATORY_CARE_PROVIDER_SITE_OTHER): Payer: Commercial Managed Care - HMO | Admitting: Cardiovascular Disease

## 2014-06-02 ENCOUNTER — Encounter: Payer: Self-pay | Admitting: Cardiovascular Disease

## 2014-06-02 ENCOUNTER — Ambulatory Visit (HOSPITAL_COMMUNITY): Payer: Medicare HMO | Attending: Cardiovascular Disease | Admitting: Cardiology

## 2014-06-02 VITALS — BP 124/68 | HR 64 | Ht 63.0 in | Wt 283.8 lb

## 2014-06-02 DIAGNOSIS — I359 Nonrheumatic aortic valve disorder, unspecified: Secondary | ICD-10-CM | POA: Diagnosis not present

## 2014-06-02 DIAGNOSIS — I351 Nonrheumatic aortic (valve) insufficiency: Secondary | ICD-10-CM

## 2014-06-02 DIAGNOSIS — E785 Hyperlipidemia, unspecified: Secondary | ICD-10-CM | POA: Insufficient documentation

## 2014-06-02 DIAGNOSIS — R609 Edema, unspecified: Secondary | ICD-10-CM | POA: Diagnosis not present

## 2014-06-02 DIAGNOSIS — E669 Obesity, unspecified: Secondary | ICD-10-CM | POA: Diagnosis not present

## 2014-06-02 DIAGNOSIS — I10 Essential (primary) hypertension: Secondary | ICD-10-CM | POA: Insufficient documentation

## 2014-06-02 NOTE — Assessment & Plan Note (Signed)
Well controlled.  Continue current medications and low sodium Dash type diet.    

## 2014-06-02 NOTE — Assessment & Plan Note (Signed)
Cholesterol is at goal.  Continue current dose of statin and diet Rx.  No myalgias or side effects.  F/U  LFT's in 6 months. Lab Results  Component Value Date   LDLCALC 94 06/19/2013

## 2014-06-02 NOTE — Patient Instructions (Signed)
Your physician wants you to follow-up in:  6 MONTHS WITH DR NISHAN  You will receive a reminder letter in the mail two months in advance. If you don't receive a letter, please call our office to schedule the follow-up appointment. Your physician recommends that you continue on your current medications as directed. Please refer to the Current Medication list given to you today. 

## 2014-06-02 NOTE — Progress Notes (Signed)
Patient ID: Charlotte [name], female   DOB: [birth year], 78 y.o.   MRN: [id number] She has a history of PAF , LBBB, HL, HTN. Dobutamine echocardiogram done in 06/2010 for syncope and demonstrated nonsustained ventricular tachycardia. EF at that time was normal. LHC 06/2010: EF 55%, mild plaque in the LAD 20-30%, otherwise normal coronary arteries. Atrial fibrillation noted 2014 Echocardiogram 02/2013: Moderate LVH, EF 25-30%, anteroseptal and apical HK, moderate AI, MAC, moderate MR, moderate LAE, mild RAE, PASP 32. Patient was placed on Xarelto for anticoagulation. She underwent DCCV 04/01/13 after several weeks of uninterrupted treatment with Xarelto. She is doing well. Notes less dyspnea. She reports NYHA Class IIb symptoms. No orthopnea, PND. LE edema is improved. No CP, syncope or palpitations.   Changed to coumadin recently due to cost F/U echo showed moderate AR and return to normal LV function  8/14  Study Conclusions  - Left ventricle: The cavity size was normal. Wall thickness was increased in a pattern of moderate LVH. Systolic function was normal. The estimated ejection fraction was in the range of 60% to 65%. Wall motion was normal; there were no regional wall motion abnormalities. Doppler parameters are consistent with abnormal left ventricular relaxation (grade 1 diastolic dysfunction). - Aortic valve: Moderate regurgitation. - Left atrium: The atrium was moderately dilated.  Some ectopic excema from edema in LE's Given doxycycline fut doubt cellulitis Kenelog lotion works better   REviewed echo from today and severe LVH normal EF only mild AR      ROS: Denies fever, malais, weight loss, blurry vision, decreased visual acuity, cough, sputum, SOB, hemoptysis, pleuritic pain, palpitaitons, heartburn, abdominal pain, melena, lower extremity edema, claudication, or rash.  All other systems reviewed and negative  General: Affect appropriate Overweight whtie female  HEENT: normal Neck  supple with no adenopathy JVP normal no bruits no thyromegaly Lungs clear with no wheezing and good diaphragmatic motion Heart:  S1/S2 SEM murmur, no rub, gallop or click PMI normal Abdomen: benighn, BS positve, no tenderness, no AAA no bruit.  No HSM or HJR Distal pulses intact with no bruits Plus one bilateral  Edema with varicose veins Neuro non-focal Skin warm and dry No muscular weakness   Current Outpatient Prescriptions  Medication Sig Dispense Refill  . Acetaminophen (TYLENOL PO) Take by mouth.      . Calcium Carb-Cholecalciferol (CALCIUM 600/VITAMIN D3) 600-800 MG-UNIT TABS Take 1 tablet by mouth 2 (two) times daily.      . carvedilol (COREG) 6.25 MG tablet Take 1 tablet (6.25 mg total) by mouth 2 (two) times daily.  180 tablet  1  . cetirizine (ZYRTEC) 10 MG tablet TAKE ONE TABLET BY MOUTH ONE TIME DAILY   90 tablet  1  . furosemide (LASIX) 20 MG tablet 1 TAB IN AM AND 2 TABS PM  270 tablet  1  . gabapentin (NEURONTIN) 300 MG capsule Take 300 mg by mouth 3 (three) times daily.       Marland Kitchen losartan (COZAAR) 100 MG tablet TAKE 1 TABLET EVERY DAY  90 tablet  1  . metolazone (ZAROXOLYN) 2.5 MG tablet Take 1 tablet (2.5 mg total) by mouth every other day.  45 tablet  0  . Multiple Vitamin (MULTIVITAMIN) capsule Take 1 capsule by mouth daily.        . polyvinyl alcohol-povidone (REFRESH) 1.4-0.6 % ophthalmic solution 1-2 drops as needed.      . potassium chloride (K-DUR) 10 MEQ tablet Take 2 tablets (20 mEq total) by mouth 2 (two)  times daily.  360 tablet  0  . triamcinolone ointment (KENALOG) 0.5 % Apply 1 application topically 2 (two) times daily.  30 g  1  . verapamil (VERELAN PM) 240 MG 24 hr capsule Take 1 capsule (240 mg total) by mouth at bedtime. GENERIC ONLY  90 capsule  1  . vitamin B-12 (CYANOCOBALAMIN) 1000 MCG tablet Take 1,000 mcg by mouth daily.        Marland Kitchen warfarin (COUMADIN) 5 MG tablet 1.5 tablets daily or as directed by coumadin clinic  145 tablet  1   No current  facility-administered medications for this visit.    Allergies  Amlodipine besylate-valsartan  Electrocardiogram:  SR rate 71  Loss of precordial R waves   Assessment and Plan

## 2014-06-02 NOTE — Progress Notes (Signed)
Echo performed. 

## 2014-06-02 NOTE — Assessment & Plan Note (Signed)
Mild by echo reviewed today Improved continue BP control No need for SBE

## 2014-06-11 ENCOUNTER — Ambulatory Visit: Payer: Medicare HMO | Admitting: Family Medicine

## 2014-06-18 ENCOUNTER — Ambulatory Visit (INDEPENDENT_AMBULATORY_CARE_PROVIDER_SITE_OTHER): Payer: Medicare HMO | Admitting: Family Medicine

## 2014-06-18 ENCOUNTER — Ambulatory Visit (INDEPENDENT_AMBULATORY_CARE_PROVIDER_SITE_OTHER): Payer: Commercial Managed Care - HMO | Admitting: Pharmacist

## 2014-06-18 ENCOUNTER — Encounter: Payer: Self-pay | Admitting: Family Medicine

## 2014-06-18 VITALS — BP 131/73 | HR 63 | Ht 62.0 in | Wt 283.0 lb

## 2014-06-18 DIAGNOSIS — Z7901 Long term (current) use of anticoagulants: Secondary | ICD-10-CM

## 2014-06-18 DIAGNOSIS — I4891 Unspecified atrial fibrillation: Secondary | ICD-10-CM

## 2014-06-18 DIAGNOSIS — Z23 Encounter for immunization: Secondary | ICD-10-CM

## 2014-06-18 DIAGNOSIS — Z5181 Encounter for therapeutic drug level monitoring: Secondary | ICD-10-CM

## 2014-06-18 DIAGNOSIS — R7301 Impaired fasting glucose: Secondary | ICD-10-CM

## 2014-06-18 DIAGNOSIS — I10 Essential (primary) hypertension: Secondary | ICD-10-CM

## 2014-06-18 LAB — POCT INR: INR: 2

## 2014-06-18 LAB — POCT GLYCOSYLATED HEMOGLOBIN (HGB A1C): HEMOGLOBIN A1C: 6

## 2014-06-18 NOTE — Assessment & Plan Note (Signed)
Well-controlled. Continue current regimen. Looks fantastic. Followup in 6 months.

## 2014-06-18 NOTE — Assessment & Plan Note (Signed)
She's done a great job. She's actually lost 3 pounds since March. Encouraged her to continue work on diet and be as active as possible. I would love to see her continually lose weight. Her A1c looks even better this time compared to previous. Follow up again in 6 months.

## 2014-06-18 NOTE — Progress Notes (Signed)
   Subjective:    Patient ID: Charlotte [name], female    DOB: 1936-04-10, 78 y.o.   MRN: [id number]  Hypertension  Diabetes   Hypertension- Pt denies chest pain, SOB, dizziness, or heart palpitations.  Taking meds as directed w/o problems.  Denies medication side effects.    IFG - Has lost 3 lbs since March. No recent change in diet. She does not exercise regularly. Lab Results  Component Value Date   HGBA1C 6.0 06/18/2014    Review of Systems     Objective:   Physical Exam  Constitutional: She is oriented to person, place, and time. She appears well-developed and well-nourished.  HENT:  Head: Normocephalic and atraumatic.  Cardiovascular: Normal rate, regular rhythm and normal heart sounds.   Pulmonary/Chest: Effort normal and breath sounds normal.  Neurological: She is alert and oriented to person, place, and time.  Skin: Skin is warm and dry.  Psychiatric: She has a normal mood and affect. Her behavior is normal.          Assessment & Plan:  Flu shot given today.    BMI 51-discussed importance of diet and try to get some regular exercise. She has some arthritis problems and is not very verbal but encouraged to be as active as possible. She has lost 3 pounds in the last 5 months and congratulated her on this.

## 2014-06-18 NOTE — Assessment & Plan Note (Signed)
She has to she can start doing her Coumadin here in our office. She currently is driving back and forth to [facility] for her INR checks. We would be happy to provide this service for her but would encourage her to speak with the cardiologist about this.

## 2014-06-24 LAB — LIPID PANEL
CHOL/HDL RATIO: 4.1 ratio
CHOLESTEROL: 197 mg/dL (ref 0–200)
HDL: 48 mg/dL (ref >39)
LDL Cholesterol: 125 mg/dL — ABNORMAL HIGH (ref 0–99)
Triglycerides: 120 mg/dL (ref <150)
VLDL: 24 mg/dL (ref 0–40)

## 2014-06-24 LAB — COMPLETE METABOLIC PANEL WITH GFR
ALT: 16 U/L (ref 0–35)
AST: 19 U/L (ref 0–37)
Albumin: 4.2 g/dL (ref 3.5–5.2)
Alkaline Phosphatase: 70 U/L (ref 39–117)
BILIRUBIN TOTAL: 0.7 mg/dL (ref 0.2–1.2)
BUN: 26 mg/dL — ABNORMAL HIGH (ref 6–23)
CO2: 29 mEq/L (ref 19–32)
Calcium: 9.9 mg/dL (ref 8.4–10.5)
Chloride: 95 mEq/L — ABNORMAL LOW (ref 96–112)
Creat: 0.85 mg/dL (ref 0.50–1.10)
GFR, EST AFRICAN AMERICAN: 77 mL/min
GFR, Est Non African American: 67 mL/min
Glucose, Bld: 102 mg/dL — ABNORMAL HIGH (ref 70–99)
Potassium: 3.5 mEq/L (ref 3.5–5.3)
Sodium: 136 mEq/L (ref 135–145)
Total Protein: 7.3 g/dL (ref 6.0–8.3)

## 2014-06-25 ENCOUNTER — Other Ambulatory Visit: Payer: Self-pay | Admitting: *Deleted

## 2014-06-25 MED ORDER — PRAVASTATIN SODIUM 40 MG PO TABS: 40.0000 mg | ORAL_TABLET | Freq: Every day | ORAL | Status: DC

## 2014-07-16 ENCOUNTER — Ambulatory Visit: Payer: Commercial Managed Care - HMO

## 2014-07-19 ENCOUNTER — Ambulatory Visit (INDEPENDENT_AMBULATORY_CARE_PROVIDER_SITE_OTHER): Payer: Commercial Managed Care - HMO | Admitting: Family Medicine

## 2014-07-19 ENCOUNTER — Encounter: Payer: Self-pay | Admitting: *Deleted

## 2014-07-19 VITALS — BP 130/65 | HR 65

## 2014-07-19 DIAGNOSIS — Z23 Encounter for immunization: Secondary | ICD-10-CM

## 2014-07-19 DIAGNOSIS — I4891 Unspecified atrial fibrillation: Secondary | ICD-10-CM

## 2014-07-19 DIAGNOSIS — I447 Left bundle-branch block, unspecified: Secondary | ICD-10-CM

## 2014-07-19 DIAGNOSIS — Z7901 Long term (current) use of anticoagulants: Secondary | ICD-10-CM

## 2014-07-19 LAB — POCT INR: INR: 2.4

## 2014-07-20 NOTE — Progress Notes (Signed)
Patient advised of recommendations and confirmed she does take 1 1/2 tablet of 5 mg daily.

## 2014-08-10 ENCOUNTER — Other Ambulatory Visit: Payer: Self-pay

## 2014-08-10 MED ORDER — POTASSIUM CHLORIDE ER 10 MEQ PO TBCR: 20.0000 meq | EXTENDED_RELEASE_TABLET | Freq: Two times a day (BID) | ORAL | Status: DC

## 2014-08-10 MED ORDER — METOLAZONE 2.5 MG PO TABS: 2.5000 mg | ORAL_TABLET | ORAL | Status: DC

## 2014-08-17 ENCOUNTER — Ambulatory Visit (INDEPENDENT_AMBULATORY_CARE_PROVIDER_SITE_OTHER): Payer: Commercial Managed Care - HMO | Admitting: Family Medicine

## 2014-08-17 VITALS — BP 128/68 | HR 64

## 2014-08-17 DIAGNOSIS — Z7901 Long term (current) use of anticoagulants: Secondary | ICD-10-CM

## 2014-08-17 LAB — POCT INR: INR: 2.5

## 2014-08-18 NOTE — Progress Notes (Signed)
Breonna notified and scheduled for follow up appt. Corliss Skains, RMA

## 2014-09-14 ENCOUNTER — Ambulatory Visit (INDEPENDENT_AMBULATORY_CARE_PROVIDER_SITE_OTHER): Payer: Commercial Managed Care - HMO | Admitting: Family Medicine

## 2014-09-14 ENCOUNTER — Ambulatory Visit: Payer: Commercial Managed Care - HMO

## 2014-09-14 DIAGNOSIS — I4891 Unspecified atrial fibrillation: Secondary | ICD-10-CM

## 2014-09-14 LAB — POCT INR: INR: 2.7

## 2014-09-15 NOTE — Progress Notes (Signed)
Charlotte [name] was notified and verbalized understanding of current dosage and follow up instructions. Corliss Skains, CMA

## 2014-10-12 ENCOUNTER — Ambulatory Visit (INDEPENDENT_AMBULATORY_CARE_PROVIDER_SITE_OTHER): Payer: Commercial Managed Care - HMO | Admitting: Family Medicine

## 2014-10-12 VITALS — BP 105/59 | HR 69

## 2014-10-12 DIAGNOSIS — Z7901 Long term (current) use of anticoagulants: Secondary | ICD-10-CM

## 2014-10-12 LAB — POCT INR: INR: 2.6

## 2014-10-12 NOTE — Progress Notes (Signed)
Voicemail left for Diella to continue same dosage and follow up in one month.

## 2014-11-05 ENCOUNTER — Other Ambulatory Visit: Payer: Self-pay

## 2014-11-05 ENCOUNTER — Telehealth: Payer: Self-pay

## 2014-11-05 MED ORDER — CARVEDILOL 6.25 MG PO TABS: 6.2500 mg | ORAL_TABLET | Freq: Two times a day (BID) | ORAL | Status: DC

## 2014-11-05 MED ORDER — LOSARTAN POTASSIUM 100 MG PO TABS: ORAL_TABLET | ORAL | Status: DC

## 2014-11-05 NOTE — Telephone Encounter (Signed)
Needs appt.  I couldn't find where we have addressed this before. If she already has a podiatrist but needs a new appt then we can make referral.

## 2014-11-05 NOTE — Telephone Encounter (Signed)
Charlotte [name] called and reports having bilateral foot pain, worse in the morning. She would like to be referred to a podiatrist. Please advise.

## 2014-11-08 NOTE — Telephone Encounter (Signed)
Patient scheduled.

## 2014-11-10 ENCOUNTER — Ambulatory Visit (INDEPENDENT_AMBULATORY_CARE_PROVIDER_SITE_OTHER): Payer: Commercial Managed Care - HMO | Admitting: Family Medicine

## 2014-11-10 DIAGNOSIS — Z7901 Long term (current) use of anticoagulants: Secondary | ICD-10-CM

## 2014-11-10 DIAGNOSIS — I4891 Unspecified atrial fibrillation: Secondary | ICD-10-CM | POA: Diagnosis not present

## 2014-11-10 LAB — POCT INR: INR: 1.8

## 2014-11-13 NOTE — Progress Notes (Signed)
Patient advised of recommendations.  

## 2014-11-16 ENCOUNTER — Ambulatory Visit: Payer: Self-pay | Admitting: Family Medicine

## 2014-11-16 ENCOUNTER — Ambulatory Visit (INDEPENDENT_AMBULATORY_CARE_PROVIDER_SITE_OTHER): Payer: Commercial Managed Care - HMO

## 2014-11-16 ENCOUNTER — Ambulatory Visit (INDEPENDENT_AMBULATORY_CARE_PROVIDER_SITE_OTHER): Payer: Commercial Managed Care - HMO | Admitting: Family Medicine

## 2014-11-16 ENCOUNTER — Encounter: Payer: Self-pay | Admitting: Family Medicine

## 2014-11-16 ENCOUNTER — Other Ambulatory Visit: Payer: Self-pay | Admitting: Family Medicine

## 2014-11-16 VITALS — BP 132/66 | HR 76 | Ht 62.0 in | Wt 285.0 lb

## 2014-11-16 DIAGNOSIS — M1710 Unilateral primary osteoarthritis, unspecified knee: Secondary | ICD-10-CM

## 2014-11-16 DIAGNOSIS — I7090 Unspecified atherosclerosis: Secondary | ICD-10-CM | POA: Diagnosis not present

## 2014-11-16 DIAGNOSIS — M25579 Pain in unspecified ankle and joints of unspecified foot: Secondary | ICD-10-CM

## 2014-11-16 DIAGNOSIS — I351 Nonrheumatic aortic (valve) insufficiency: Secondary | ICD-10-CM | POA: Diagnosis not present

## 2014-11-16 DIAGNOSIS — M19072 Primary osteoarthritis, left ankle and foot: Secondary | ICD-10-CM | POA: Diagnosis not present

## 2014-11-16 DIAGNOSIS — M2142 Flat foot [pes planus] (acquired), left foot: Secondary | ICD-10-CM | POA: Diagnosis not present

## 2014-11-16 DIAGNOSIS — M179 Osteoarthritis of knee, unspecified: Secondary | ICD-10-CM | POA: Diagnosis not present

## 2014-11-16 DIAGNOSIS — M7732 Calcaneal spur, left foot: Secondary | ICD-10-CM

## 2014-11-16 DIAGNOSIS — M7989 Other specified soft tissue disorders: Secondary | ICD-10-CM | POA: Diagnosis not present

## 2014-11-16 DIAGNOSIS — I4891 Unspecified atrial fibrillation: Secondary | ICD-10-CM | POA: Diagnosis not present

## 2014-11-16 DIAGNOSIS — M85871 Other specified disorders of bone density and structure, right ankle and foot: Secondary | ICD-10-CM

## 2014-11-16 DIAGNOSIS — IMO0002 Reserved for concepts with insufficient information to code with codable children: Secondary | ICD-10-CM

## 2014-11-16 DIAGNOSIS — M7731 Calcaneal spur, right foot: Secondary | ICD-10-CM

## 2014-11-16 DIAGNOSIS — M2141 Flat foot [pes planus] (acquired), right foot: Secondary | ICD-10-CM

## 2014-11-16 DIAGNOSIS — M19071 Primary osteoarthritis, right ankle and foot: Secondary | ICD-10-CM | POA: Diagnosis not present

## 2014-11-16 DIAGNOSIS — I70293 Other atherosclerosis of native arteries of extremities, bilateral legs: Secondary | ICD-10-CM | POA: Diagnosis not present

## 2014-11-16 DIAGNOSIS — M25473 Effusion, unspecified ankle: Secondary | ICD-10-CM | POA: Diagnosis not present

## 2014-11-16 NOTE — Progress Notes (Signed)
   Subjective:    Patient ID: Charlotte [name], female    DOB: Dec 31, 1935, 79 y.o.   MRN: [id number]  HPI Hx of plantar fascitis and neuropathy. She is requesting refer to Podiatry. Does get chronic swelling in her legs form her CHF.  Is on a diuretic. Ankles really bother her.  Says more pian laterally.  She denies any recent trauma or injury but does have a history of arthritis. She denies any pain at the bottoms of her feet or into her arches. She occasionally gets pain that is shooting in nature from just below her knee down through the top of her foot.  Atrial fibrillation-she says she got the call to adjust her Coumadin this weekend. She currently takes a 5 mg tab and takes 1-1/2 tabs daily except she did take 2 tabs on Sunday. Review of Systems     Objective:   Physical Exam  Constitutional: She appears well-developed and well-nourished.  HENT:  Head: Normocephalic and atraumatic.  Musculoskeletal:  She does have significant ankle swelling laterally in both feet as well as some 1+ edema of the lower extremity bilaterally that is pitting. No increased laxity of the ankle joints. Strength in all directions is 5 out of 5. Unable to palpate dorsal pedal pulses bilaterally but great capillary refills on the toes. Great strength of the great toe bilaterally. Nontender over the calcaneus. Mildly tender over the left anterior edge of the lateral malleolus. Nontender over the right lateral malleolus. Nontender medially.  Skin: Skin is warm and dry.  Psychiatric: She has a normal mood and affect. Her behavior is normal.          Assessment & Plan:  Chronic ankle swelling-suspect somewhat related to congestive heart failure as well as venous stasis. She is also obese which is continuing greatly to her fluid issues. Next  Chronic ankle pain-predominantly lateral but also complains of it hurting all the way through the ankles. Recommend x-rays today just to evaluate for degree of osteoarthritis. May  be related to the stretching of the skin from the swelling itself or may be from her peripheral neuropathy. I did not see any sign of plantar fasciitis or tarsal tunnel syndrome. No sign of peripheral vascular disease on exam. Consider referral to our sports medicine doctor for further evaluation.  She also needs a referral placed for her cardiologist. Her insurance requires this. She currently sees Dr. Theron Arista edition and has an appoint with him in about a month. Unfortunately she received a bill from December where she saw one of her specialists and did not have a referral placed at a time and the claim was denied.  Atrial fibrillation-see intake regulation flowsheet for adjustments to her Coumadin. Unfortunately she didn't get the call for couple days after the Coumadin was adjusted. In addition the dose that we thought she was taking was incorrect. I have corrected the information with her here today and printed out a new intake regulation flowsheet. We'll recheck her level in one week.

## 2014-11-24 ENCOUNTER — Ambulatory Visit (INDEPENDENT_AMBULATORY_CARE_PROVIDER_SITE_OTHER): Payer: Commercial Managed Care - HMO | Admitting: Family Medicine

## 2014-11-24 DIAGNOSIS — Z7901 Long term (current) use of anticoagulants: Secondary | ICD-10-CM | POA: Diagnosis not present

## 2014-11-24 LAB — POCT INR: INR: 2.3

## 2014-11-25 ENCOUNTER — Encounter: Payer: Self-pay | Admitting: Sports Medicine

## 2014-11-25 ENCOUNTER — Ambulatory Visit (INDEPENDENT_AMBULATORY_CARE_PROVIDER_SITE_OTHER): Payer: Commercial Managed Care - HMO | Admitting: Sports Medicine

## 2014-11-25 VITALS — BP 153/77 | HR 89 | Ht 63.0 in | Wt 283.0 lb

## 2014-11-25 DIAGNOSIS — M19071 Primary osteoarthritis, right ankle and foot: Secondary | ICD-10-CM

## 2014-11-25 DIAGNOSIS — L918 Other hypertrophic disorders of the skin: Secondary | ICD-10-CM

## 2014-11-25 DIAGNOSIS — M19072 Primary osteoarthritis, left ankle and foot: Secondary | ICD-10-CM | POA: Diagnosis not present

## 2014-11-25 MED ORDER — TRAMADOL HCL 50 MG PO TABS
ORAL_TABLET | ORAL | Status: DC
Start: 2014-11-25 — End: 2014-12-09

## 2014-11-25 MED ORDER — ACETAMINOPHEN ER 650 MG PO TBCR: 1300.0000 mg | EXTENDED_RELEASE_TABLET | Freq: Three times a day (TID) | ORAL | Status: DC | PRN

## 2014-11-25 NOTE — Progress Notes (Signed)
Subjective:    I'm seeing this patient as a consultation for:  Dr. Linford Arnold  CC: bilateral ankle pain  HPI: This is a pleasant 79 year old female who comes in with a long history of pain in both ankles, pain is localized anteriorly at the talocrural joint, she does get gelling and morning stiffness, and has tried regular strength Tylenol with only minimal response. NSAIDs have been avoided due to her congestive heart failure and edema appropriately. Symptoms are moderate, persistent. She has never had interventional treatment.  Past medical history, Surgical history, Family history not pertinant except as noted below, Social history, Allergies, and medications have been entered into the medical record, reviewed, and no changes needed.   Review of Systems: No headache, visual changes, nausea, vomiting, diarrhea, constipation, dizziness, abdominal pain, skin rash, fevers, chills, night sweats, weight loss, swollen lymph nodes, body aches, joint swelling, muscle aches, chest pain, shortness of breath, mood changes, visual or auditory hallucinations.   Objective:   General: Well Developed, well nourished, and in no acute distress.  Neuro/Psych: Alert and oriented x3, extra-ocular muscles intact, able to move all 4 extremities, sensation grossly intact. Skin: Warm and dry, no rashes noted.  Respiratory: Not using accessory muscles, speaking in full sentences, trachea midline.  Cardiovascular: Pulses palpable, no extremity edema. Abdomen: Does not appear distended. Bilateral Ankle: Visibly swollen bilaterally with overlying varicosities, there is also a skin tag over her left anterior ankle. Range of motion is full in all directions. Bilateral pes planus. Strength is 5/5 in all directions. Stable lateral and medial ligaments; squeeze test and kleiger test unremarkable; Tender to palpation over the entirety of the talar dome and the talocrural joint anteriorly. There is a palpable fluid  wave. 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  Procedure: Real-time Ultrasound Guided Injection of left talocrural joint Device: GE Logiq E  Verbal informed consent obtained.  Time-out conducted.  Noted no overlying erythema, induration, or other signs of local infection.  Skin prepped in a sterile fashion.  Local anesthesia: Topical Ethyl chloride.  With sterile technique and under real time ultrasound guidance:  25-gauge needle advanced into joint, 1 mL kenalog 40, 2 mL lidocaine injected easily. Completed without difficulty  Pain immediately resolved suggesting accurate placement of the medication.  Advised to call if fevers/chills, erythema, induration, drainage, or persistent bleeding.  Images permanently stored and available for review in the ultrasound unit.  Impression: Technically successful ultrasound guided injection.  Procedure: Real-time Ultrasound Guided Injection of right talocrural joint Device: GE Logiq E  Verbal informed consent obtained.  Time-out conducted.  Noted no overlying erythema, induration, or other signs of local infection.  Skin prepped in a sterile fashion.  Local anesthesia: Topical Ethyl chloride.  With sterile technique and under real time ultrasound guidance:  25-gauge needle advanced into joint, 1 mL kenalog 40, 2 mL lidocaine injected easily. Completed without difficulty  Pain immediately resolved suggesting accurate placement of the medication.  Advised to call if fevers/chills, erythema, induration, drainage, or persistent bleeding.  Images permanently stored and available for review in the ultrasound unit.  Impression:  Technically successful ultrasound guided injection.  Procedure:  Removal of single left foot skin tag(s). Risks, benefits, alternatives explained  to patient. Consent obtained. Time out conducted. Noted no overlying induration or erythema at site of injection. A small amount of lidocaine with epinephrine infiltrated under the skin tag(s) for local anesthesia. Hemostat used to clamp the neck of the skin tag(s). Scalpel then used to excise the skin tag(s), and subsequent electrocautery with a Hyfrecator used to control minor bleeding. Antibiotic ointment applied. Wound dressed. Advised to return if increased redness, swelling, drainage, fevers, or chills.  Impression and Recommendations:   This case required medical decision making of moderate complexity.

## 2014-11-25 NOTE — Assessment & Plan Note (Signed)
Increasing to Tylenol 652 tabs 3 times a day, tramadol for breakthrough pain. Bilateral ankle injection as above. Return for custom orthotics.

## 2014-11-25 NOTE — Assessment & Plan Note (Signed)
Excision as above.

## 2014-11-26 NOTE — Progress Notes (Signed)
Patient advised of recommendations.  

## 2014-11-29 NOTE — Progress Notes (Signed)
Patient ID: Charlotte [name], female   DOB: 12-29-1935, 79 y.o.   MRN: [id number] She has a history of PAF , LBBB, HL, HTN. Dobutamine echocardiogram done in 06/2010 for syncope and demonstrated nonsustained ventricular tachycardia. EF at that time was normal. LHC 06/2010: EF 55%, mild plaque in the LAD 20-30%, otherwise normal coronary arteries. Atrial fibrillation noted 2014 Echocardiogram 02/2013: Moderate LVH, EF 25-30%, anteroseptal and apical HK, moderate AI, MAC, moderate MR, moderate LAE, mild RAE, PASP 32. Patient was placed on Xarelto for anticoagulation. She underwent DCCV 04/01/13 after several weeks of uninterrupted treatment with Xarelto. She is doing well. Notes less dyspnea. She reports NYHA Class IIb symptoms. No orthopnea, PND. LE edema is improved. No CP, syncope or palpitations.   Changed to coumadin recently due to cost   Echo 06/02/14  Only mild AR reviewed   Study Conclusions  - Left ventricle: The cavity size was normal. Wall thickness was increased in a pattern of severe LVH. Systolic function was normal. The estimated ejection fraction was in the range of 55% to 60%. Doppler parameters are consistent with abnormal left ventricular relaxation (grade 1 diastolic dysfunction). - Aortic valve: There was mild regurgitation. - Left atrium: The atrium was mildly dilated. - Atrial septum: No defect or patent foramen ovale was identified.     ROS: Denies fever, malais, weight loss, blurry vision, decreased visual acuity, cough, sputum, SOB, hemoptysis, pleuritic pain, palpitaitons, heartburn, abdominal pain, melena, lower extremity edema, claudication, or rash.  All other systems reviewed and negative  General: Affect appropriate Overweight whtie female  HEENT: normal Neck supple with no adenopathy JVP normal no bruits no thyromegaly Lungs clear with no wheezing and good diaphragmatic motion Heart:  S1/S2 SEM murmur, no rub, gallop or click PMI normal Abdomen: benighn, BS  positve, no tenderness, no AAA no bruit.  No HSM or HJR Distal pulses intact with no bruits Plus one bilateral  Edema with varicose veins Neuro non-focal Skin warm and dry No muscular weakness   Current Outpatient Prescriptions  Medication Sig Dispense Refill  . acetaminophen (TYLENOL) 650 MG CR tablet Take 2 tablets (1,300 mg total) by mouth every 8 (eight) hours as needed for pain. 90 tablet 3  . Calcium Carb-Cholecalciferol (CALCIUM 600/VITAMIN D3) 600-800 MG-UNIT TABS Take 1 tablet by mouth 2 (two) times daily.    . carvedilol (COREG) 6.25 MG tablet Take 1 tablet (6.25 mg total) by mouth 2 (two) times daily. 180 tablet 1  . cetirizine (ZYRTEC) 10 MG tablet TAKE ONE TABLET BY MOUTH ONE TIME DAILY  90 tablet 1  . furosemide (LASIX) 20 MG tablet 1 TAB IN AM AND 2 TABS PM 270 tablet 1  . gabapentin (NEURONTIN) 300 MG capsule Take 300 mg by mouth 4 (four) times daily.     Marland Kitchen losartan (COZAAR) 100 MG tablet TAKE 1 TABLET EVERY DAY 90 tablet 1  . metolazone (ZAROXOLYN) 2.5 MG tablet Take 1 tablet (2.5 mg total) by mouth every other day. 45 tablet 2  . Multiple Vitamin (MULTIVITAMIN) capsule Take 1 capsule by mouth daily.      . polyvinyl alcohol-povidone (REFRESH) 1.4-0.6 % ophthalmic solution 1-2 drops as needed.    . potassium chloride (K-DUR) 10 MEQ tablet Take 2 tablets (20 mEq total) by mouth 2 (two) times daily. 360 tablet 1  . pravastatin (PRAVACHOL) 40 MG tablet Take 1 tablet (40 mg total) by mouth daily. 90 tablet 3  . traMADol (ULTRAM) 50 MG tablet 1-2 tabs by mouth Q8  hours, maximum 6 tabs per day. 90 tablet 0  . verapamil (VERELAN PM) 240 MG 24 hr capsule Take 1 capsule (240 mg total) by mouth at bedtime. GENERIC ONLY 90 capsule 1  . vitamin B-12 (CYANOCOBALAMIN) 1000 MCG tablet Take 1,000 mcg by mouth daily.      Marland Kitchen warfarin (COUMADIN) 5 MG tablet 1.5 tablets daily or as directed by coumadin clinic 145 tablet 1   No current facility-administered medications for this visit.     Allergies  Amlodipine besylate-valsartan  Electrocardiogram:  SR rate 71  Loss of precordial R waves 8/15   Assessment and Plan

## 2014-11-30 ENCOUNTER — Encounter: Payer: Self-pay | Admitting: Cardiovascular Disease

## 2014-11-30 ENCOUNTER — Ambulatory Visit (INDEPENDENT_AMBULATORY_CARE_PROVIDER_SITE_OTHER): Payer: Commercial Managed Care - HMO | Admitting: Cardiovascular Disease

## 2014-11-30 VITALS — BP 140/80 | HR 70 | Ht 63.0 in | Wt 279.0 lb

## 2014-11-30 DIAGNOSIS — I10 Essential (primary) hypertension: Secondary | ICD-10-CM

## 2014-11-30 DIAGNOSIS — I472 Ventricular tachycardia: Secondary | ICD-10-CM | POA: Diagnosis not present

## 2014-11-30 DIAGNOSIS — I4729 Other ventricular tachycardia: Secondary | ICD-10-CM

## 2014-11-30 DIAGNOSIS — I4891 Unspecified atrial fibrillation: Secondary | ICD-10-CM

## 2014-11-30 DIAGNOSIS — R609 Edema, unspecified: Secondary | ICD-10-CM

## 2014-11-30 DIAGNOSIS — I351 Nonrheumatic aortic (valve) insufficiency: Secondary | ICD-10-CM | POA: Diagnosis not present

## 2014-11-30 NOTE — Assessment & Plan Note (Signed)
Well controlled.  Continue current medications and low sodium Dash type diet.    

## 2014-11-30 NOTE — Assessment & Plan Note (Signed)
Non recurrent EF ok no obstructive CAD  Continue beta blocker

## 2014-11-30 NOTE — Assessment & Plan Note (Signed)
Continue bid lasix mostly now from obesity and varicosities

## 2014-11-30 NOTE — Assessment & Plan Note (Signed)
Mild on echo 8/15  Consider f/u in 6 months  No murmur on exam today but she is obese

## 2014-11-30 NOTE — Assessment & Plan Note (Signed)
Maint NSR post DCC.  Continue coreg  INR checked at [facility] office in [place]

## 2014-12-09 ENCOUNTER — Ambulatory Visit (INDEPENDENT_AMBULATORY_CARE_PROVIDER_SITE_OTHER): Payer: Medicare HMO | Admitting: Sports Medicine

## 2014-12-09 ENCOUNTER — Encounter: Payer: Self-pay | Admitting: Sports Medicine

## 2014-12-09 VITALS — BP 139/79 | HR 73 | Wt 275.0 lb

## 2014-12-09 DIAGNOSIS — M19072 Primary osteoarthritis, left ankle and foot: Secondary | ICD-10-CM

## 2014-12-09 DIAGNOSIS — M19071 Primary osteoarthritis, right ankle and foot: Secondary | ICD-10-CM

## 2014-12-09 MED ORDER — TRAMADOL HCL 50 MG PO TABS: ORAL_TABLET | ORAL | Status: DC

## 2014-12-09 NOTE — Addendum Note (Signed)
Addended by: Monica Becton on: 12/09/2014 11:21 AM   Modules accepted: Orders

## 2014-12-09 NOTE — Assessment & Plan Note (Addendum)
Approximately 60% improved after talocrural injection, custom orthotics as above, return in a month. Refilling tramadol, uses approximately 4 per day.

## 2014-12-09 NOTE — Progress Notes (Signed)
    Patient was fitted for a : standard, cushioned, semi-rigid orthotic. The orthotic was heated and afterward the patient stood on the orthotic blank positioned on the orthotic stand. The patient was positioned in subtalar neutral position and 10 degrees of ankle dorsiflexion in a weight bearing stance. After completion of molding, a stable base was applied to the orthotic blank. The blank was ground to a stable position for weight bearing. Size: 7 Base: [facility], hospital and Padding: it was very difficult to get her into a neutral position, I had added scaphoid pads bilaterally. The patient ambulated these, and they were very comfortable.  I spent 40 minutes with this patient, greater than 50% was face-to-face time counseling regarding the below diagnosis.

## 2014-12-10 ENCOUNTER — Ambulatory Visit (INDEPENDENT_AMBULATORY_CARE_PROVIDER_SITE_OTHER): Payer: Commercial Managed Care - HMO | Admitting: Family Medicine

## 2014-12-10 VITALS — BP 122/69 | HR 66

## 2014-12-10 DIAGNOSIS — I482 Chronic atrial fibrillation, unspecified: Secondary | ICD-10-CM

## 2014-12-10 LAB — POCT INR: INR: 1.7

## 2014-12-10 NOTE — Progress Notes (Signed)
Patient advised of recommendations.  

## 2014-12-10 NOTE — Progress Notes (Signed)
Left detailed message.   

## 2014-12-17 ENCOUNTER — Encounter: Payer: Self-pay | Admitting: Family Medicine

## 2014-12-17 ENCOUNTER — Ambulatory Visit (INDEPENDENT_AMBULATORY_CARE_PROVIDER_SITE_OTHER): Payer: Commercial Managed Care - HMO | Admitting: Family Medicine

## 2014-12-17 ENCOUNTER — Other Ambulatory Visit: Payer: Self-pay | Admitting: *Deleted

## 2014-12-17 VITALS — BP 118/68 | HR 67 | Wt 278.0 lb

## 2014-12-17 DIAGNOSIS — I10 Essential (primary) hypertension: Secondary | ICD-10-CM | POA: Diagnosis not present

## 2014-12-17 DIAGNOSIS — R7301 Impaired fasting glucose: Secondary | ICD-10-CM | POA: Diagnosis not present

## 2014-12-17 LAB — POCT GLYCOSYLATED HEMOGLOBIN (HGB A1C): Hemoglobin A1C: 6

## 2014-12-17 MED ORDER — WARFARIN SODIUM 5 MG PO TABS: ORAL_TABLET | ORAL | Status: DC

## 2014-12-17 NOTE — Progress Notes (Signed)
   Subjective:    Patient ID: Charlotte [name], female    DOB: 1936/01/04, 79 y.o.   MRN: [id number]  HPI IFG - No inc thirst or urination.  Her weight is down about 5 pounds from the last time I saw her 6 months ago which is fantastic.   Hypertension- Pt denies chest pain, SOB, dizziness, or heart palpitations.  Taking meds as directed w/o problems.  Denies medication side effects.       Review of Systems     Objective:   Physical Exam  Constitutional: She is oriented to person, place, and time. She appears well-developed and well-nourished.  HENT:  Head: Normocephalic and atraumatic.  Cardiovascular: Normal rate, regular rhythm and normal heart sounds.   Pulmonary/Chest: Effort normal and breath sounds normal.  Neurological: She is alert and oriented to person, place, and time.  Skin: Skin is warm and dry.  Psychiatric: She has a normal mood and affect. Her behavior is normal.          Assessment & Plan:  IFG - Stable. Continue to work on diet and exercise.  Follow-up in 6 months.  HTN - Well controlled.  Due for BMP. Continue current regimen. Follow up in 6 months.

## 2014-12-24 ENCOUNTER — Ambulatory Visit (INDEPENDENT_AMBULATORY_CARE_PROVIDER_SITE_OTHER): Payer: Commercial Managed Care - HMO | Admitting: Family Medicine

## 2014-12-24 DIAGNOSIS — I4891 Unspecified atrial fibrillation: Secondary | ICD-10-CM | POA: Diagnosis not present

## 2014-12-24 LAB — POCT INR: INR: 1.8

## 2014-12-24 NOTE — Progress Notes (Signed)
Patient advised of recommendations.  

## 2014-12-29 ENCOUNTER — Other Ambulatory Visit: Payer: Self-pay | Admitting: *Deleted

## 2014-12-29 MED ORDER — VERAPAMIL HCL ER 240 MG PO CP24: 240.0000 mg | ORAL_CAPSULE | Freq: Every day | ORAL | Status: DC

## 2015-01-06 ENCOUNTER — Ambulatory Visit (INDEPENDENT_AMBULATORY_CARE_PROVIDER_SITE_OTHER): Payer: Medicare HMO | Admitting: Sports Medicine

## 2015-01-06 ENCOUNTER — Encounter: Payer: Self-pay | Admitting: Sports Medicine

## 2015-01-06 DIAGNOSIS — M19071 Primary osteoarthritis, right ankle and foot: Secondary | ICD-10-CM

## 2015-01-06 DIAGNOSIS — M19072 Primary osteoarthritis, left ankle and foot: Secondary | ICD-10-CM

## 2015-01-06 MED ORDER — DICLOFENAC SODIUM 2 % TD SOLN: 2.0000 | Freq: Two times a day (BID) | TRANSDERMAL | Status: DC

## 2015-01-06 NOTE — Assessment & Plan Note (Signed)
50+ percent better with injection and custom orthotics. Continue Tylenol and as needed tramadol. Adding topical diclofenac. Return to see me in 2 months, we can inject 4 times per year. She does not desire to even consider surgical intervention.

## 2015-01-06 NOTE — Progress Notes (Signed)
  Subjective:    CC: Follow-up  HPI: Charlotte [name] returns, she has bilateral talocrural osteoarthritis, I injected both talocrural joint at the last visit, and she did extremely well, we then built her custom orthotics which also helped significantly. She was doing very well until she had a accidental fall, she has a couple of bruises but is otherwise okay. Overall she is happy with how things are going, we have not been able use oral NSAIDs due to her being on Coumadin, but she is amenable to trying a topical NSAID.  Past medical history, Surgical history, Family history not pertinant except as noted below, Social history, Allergies, and medications have been entered into the medical record, reviewed, and no changes needed.   Review of Systems: No fevers, chills, night sweats, weight loss, chest pain, or shortness of breath.   Objective:    General: Well Developed, well nourished, and in no acute distress.  Neuro: Alert and oriented x3, extra-ocular muscles intact, sensation grossly intact.  HEENT: Normocephalic, atraumatic, pupils equal round reactive to light, neck supple, no masses, no lymphadenopathy, thyroid nonpalpable.  Skin: Warm and dry, no rashes. Cardiac: Regular rate and rhythm, no murmurs rubs or gallops, no lower extremity edema.  Respiratory: Clear to auscultation bilaterally. Not using accessory muscles, speaking in full sentences.  Impression and Recommendations:

## 2015-01-07 ENCOUNTER — Other Ambulatory Visit: Payer: Self-pay

## 2015-01-07 ENCOUNTER — Ambulatory Visit: Payer: Self-pay

## 2015-01-07 ENCOUNTER — Ambulatory Visit (INDEPENDENT_AMBULATORY_CARE_PROVIDER_SITE_OTHER): Payer: Medicare HMO | Admitting: Family Medicine

## 2015-01-07 VITALS — BP 125/83 | HR 42 | Ht 63.0 in | Wt 273.0 lb

## 2015-01-07 DIAGNOSIS — I482 Chronic atrial fibrillation, unspecified: Secondary | ICD-10-CM

## 2015-01-07 LAB — POCT INR: INR: 2

## 2015-01-07 NOTE — Patient Outreach (Signed)
[name]) Care Management  01/07/2015  Charlotte [name] 1936-03-28 [id number]   SUBJECTIVE:  Telephone call to patient regarding disease management follow up.  Patient states she fell at her daughters home on 01/02/2015.  Patient states she was walking in her daughters garage and ran into the lawn furniture and fell.  Patient reports she has a bruise on her left arm between her shoulder and elbow.  Patient denies seeking any medical attention regarding the fall and/ or bruised arm.  Patient states her daughter is a Engineer, civil (consulting).   Patient reports she saw Dr. Benjamin [name] on 01/06/15.  Patient states she has received steroid injection to both ankles and was given topical cream to apply to ankles for pain.  Patient reports the pain in her ankles has decreased to a level of 5-6.  Patient states pain level prior to steroid injections was 8-9.   Patient reports she has also received orthotics for her shoes.   Patient reports she is in the Charlotte [name] zone of her heart failure action plan today.  Patient denies any shortness of breath, swelling, cough.  Patient reports she continues to adhere to her low sodium diet.    Patient reports she has received EMMI education material regarding heart failure, [facility] calendar, and Charlotte [name] packet.   Patient reports she continues to go to coumadin clinic as directed.   ASSESSMENT:  Patient requires additional review of heart failure action plan/zones. RNCM reviewed with patient when to contact physician for heart failure signs and symptoms.  Patient verbalizes understanding of low sodium diet choices and food preparation.  RNCM advised patient regarding fall safety and continue to use ambulatory devices as advised by doctor for safety.  RNCM offered patient [name] community care manager to assess/evaluate patient for fall safety at home.  Patient declined.  RNCM advised patient to take and apply medications as prescribed by her doctors.   PLAN:  RNCM will follow up with patient  within 1 month. RNCM will send patient's  primary MD update regarding recent fall assessment. Patient will sign consent form and return to Charlotte [name] care management office.  RNCM will send patient fall safety EMMI education material Patient will review heart failure action plan and report action plans at next outreach.   Charlotte Ina RN,BSN,CCM [name] Telephonic Care Coordinator [phone]

## 2015-01-07 NOTE — Patient Instructions (Signed)
Instructions to patient from telephone call with nurse on: 01/07/15  Review fall safety education material  Complete consent form and send back to [facility] care management office in self addressed envelop.  Nurse will contact you by phone within 1 month  Keep all scheduled doctors appointments  Take medications as prescribed by doctor  Report falls to doctor   It is important to avoid accidents which may result in broken bones.  Here are a few ideas on how to make your home safer so you will be less likely to trip or fall.  1. Use nonskid mats or non slip strips in your shower or tub, on your bathroom floor and around sinks.  If you know that you have spilled water, wipe it up! 2. In the bathroom, it is important to have properly installed grab bars on the walls or on the edge of the tub.  Towel racks are NOT strong enough for you to hold onto or to pull on for support. 3. Stairs and hallways should have enough light.  Add lamps or night lights if you need ore light. 4. It is good to have handrails on both sides of the stairs if possible.  Always fix broken handrails right away. 5. It is important to see the edges of steps.  Paint the edges of outdoor steps white so you can see them better.  Put colored tape on the edge of inside steps. 6. Throw-rugs are dangerous because they can slide.  Removing the rugs is the best idea, but if they must stay, add adhesive carpet tape to prevent slipping. 7. Do not keep things on stairs or in the halls.  Remove small furniture that blocks the halls as it may cause you to trip.  Keep telephone and electrical cords out of the way where you walk. 8. Always were sturdy, rubber-soled shoes for good support.  Never [place] just socks, especially on the stairs.  Socks may cause you to slip or fall.  Do not [place] full-length housecoats as you can easily trip on the bottom.  9. Place the things you use the most on the shelves that are the easiest to reach.  If you use a stepstool,  make sure it is in good condition.  If you feel unsteady, DO NOT climb, ask for help. 10. If a health professional advises you to use a cane or walker, do not be ashamed.  These items can keep you from falling and breaking your bones.   George Ina RN,BSN,CCM [facility] Telephonic Care Coordinator [phone]

## 2015-01-07 NOTE — Progress Notes (Signed)
Advised Pt of dosage change. Pt was able to provide correct read back. Scheduled nurse visit for 2 weeks. No further questions.

## 2015-01-21 ENCOUNTER — Ambulatory Visit (INDEPENDENT_AMBULATORY_CARE_PROVIDER_SITE_OTHER): Payer: Medicare HMO | Admitting: Family Medicine

## 2015-01-21 VITALS — BP 143/75 | HR 84

## 2015-01-21 DIAGNOSIS — I4891 Unspecified atrial fibrillation: Secondary | ICD-10-CM

## 2015-01-21 LAB — PROTIME-INR
INR: 3.94 — ABNORMAL HIGH (ref <1.50)
PROTHROMBIN TIME: 38.5 s — AB (ref 11.6–15.2)

## 2015-01-21 LAB — POCT INR: INR: 5.5

## 2015-01-21 NOTE — Progress Notes (Signed)
   Subjective:    Patient ID: Charlotte [name], female    DOB: 1936-05-20, 79 y.o.   MRN: [id number]  HPI    Review of Systems     Objective:   Physical Exam        Assessment & Plan:  Point-of-care INR elevated. Patient sent to the lab for confirmation.

## 2015-01-24 ENCOUNTER — Ambulatory Visit: Payer: Self-pay | Admitting: Family Medicine

## 2015-01-31 ENCOUNTER — Encounter: Payer: Self-pay | Admitting: Family Medicine

## 2015-01-31 ENCOUNTER — Ambulatory Visit (INDEPENDENT_AMBULATORY_CARE_PROVIDER_SITE_OTHER): Payer: Commercial Managed Care - HMO | Admitting: Family Medicine

## 2015-01-31 VITALS — BP 122/70 | HR 73 | Ht 68.0 in | Wt 275.0 lb

## 2015-01-31 DIAGNOSIS — Z0000 Encounter for general adult medical examination without abnormal findings: Secondary | ICD-10-CM | POA: Diagnosis not present

## 2015-01-31 DIAGNOSIS — E785 Hyperlipidemia, unspecified: Secondary | ICD-10-CM | POA: Diagnosis not present

## 2015-01-31 DIAGNOSIS — Z7901 Long term (current) use of anticoagulants: Secondary | ICD-10-CM | POA: Diagnosis not present

## 2015-01-31 NOTE — Patient Instructions (Signed)
Keep up a regular exercise program and make sure you are eating a healthy diet Try to eat 4 servings of dairy a day, or if you are lactose intolerant take a calcium with vitamin D daily.  Your vaccines are up to date.   

## 2015-01-31 NOTE — Progress Notes (Signed)
Subjective:    Charlotte [name] is a 79 y.o. female who presents for Medicare Annual/Subsequent preventive examination.  Preventive Screening-Counseling & Management  Tobacco History  Smoking status  . Former Smoker  . Quit date: 10/08/1970  Smokeless tobacco  . Not on file     Problems Prior to Visit 1.   Current Problems (verified) Patient Active Problem List   Diagnosis Date Noted  . Skin tag 11/25/2014  . Atrial fibrillation, unspecified 11/10/2014  . Severe obesity (BMI >= 40) 06/18/2014  . Aortic regurgitation 03/03/2014  . IFG (impaired fasting glucose) 12/09/2013  . Long term current use of anticoagulant therapy 05/14/2013  . ACTINIC KERATOSIS 11/13/2010  . VITAMIN D DEFICIENCY 10/31/2010  . VENTRICULAR TACHYCARDIA 05/31/2010  . SYNCOPE 04/24/2010  . DYSPNEA 04/24/2010  . CATARACTS 04/20/2010  . EDEMA 02/28/2010  . MEDIAL EPICONDYLITIS 10/20/2009  . ELECTROCARDIOGRAM, ABNORMAL 09/19/2007  . Osteoarthritis of both ankles 01/01/2007  . Hyperlipidemia 08/13/2006  . HYPERTENSION, BENIGN SYSTEMIC 07/16/2006  . RHINITIS, ALLERGIC 07/16/2006  . GASTROESOPHAGEAL REFLUX, NO ESOPHAGITIS 07/16/2006  . IRRITABLE BOWEL SYNDROME 07/16/2006  . OSTEOPENIA 07/16/2006    Medications Prior to Visit Current Outpatient Prescriptions on File Prior to Visit  Medication Sig Dispense Refill  . acetaminophen (TYLENOL) 650 MG CR tablet Take 2 tablets (1,300 mg total) by mouth every 8 (eight) hours as needed for pain. 90 tablet 3  . Calcium Carb-Cholecalciferol (CALCIUM 600/VITAMIN D3) 600-800 MG-UNIT TABS Take 1 tablet by mouth 2 (two) times daily.    . carvedilol (COREG) 6.25 MG tablet Take 1 tablet (6.25 mg total) by mouth 2 (two) times daily. 180 tablet 1  . cetirizine (ZYRTEC) 10 MG tablet TAKE ONE TABLET BY MOUTH ONE TIME DAILY  90 tablet 1  . Diclofenac Sodium 2 % SOLN Place 2 sprays onto the skin 2 (two) times daily. 1 Bottle 11  . furosemide (LASIX) 20 MG tablet 1 TAB IN AM  AND 2 TABS PM 270 tablet 1  . gabapentin (NEURONTIN) 300 MG capsule Take 300 mg by mouth 4 (four) times daily.     Marland Kitchen losartan (COZAAR) 100 MG tablet TAKE 1 TABLET EVERY DAY 90 tablet 1  . metolazone (ZAROXOLYN) 2.5 MG tablet Take 1 tablet (2.5 mg total) by mouth every other day. 45 tablet 2  . Multiple Vitamin (MULTIVITAMIN) capsule Take 1 capsule by mouth daily.      . polyvinyl alcohol-povidone (REFRESH) 1.4-0.6 % ophthalmic solution 1-2 drops as needed.    . potassium chloride (K-DUR) 10 MEQ tablet Take 2 tablets (20 mEq total) by mouth 2 (two) times daily. 360 tablet 1  . pravastatin (PRAVACHOL) 40 MG tablet Take 1 tablet (40 mg total) by mouth daily. 90 tablet 3  . traMADol (ULTRAM) 50 MG tablet 1 tablet up to 4 times per day 360 tablet 0  . verapamil (VERELAN PM) 240 MG 24 hr capsule Take 1 capsule (240 mg total) by mouth at bedtime. GENERIC ONLY 90 capsule 1  . vitamin B-12 (CYANOCOBALAMIN) 1000 MCG tablet Take 1,000 mcg by mouth daily.      Marland Kitchen warfarin (COUMADIN) 5 MG tablet 1.5 tablets daily or as directed by coumadin clinic 145 tablet 1   No current facility-administered medications on file prior to visit.    Current Medications (verified) Current Outpatient Prescriptions  Medication Sig Dispense Refill  . acetaminophen (TYLENOL) 650 MG CR tablet Take 2 tablets (1,300 mg total) by mouth every 8 (eight) hours as needed for pain. 90 tablet 3  .  Calcium Carb-Cholecalciferol (CALCIUM 600/VITAMIN D3) 600-800 MG-UNIT TABS Take 1 tablet by mouth 2 (two) times daily.    . carvedilol (COREG) 6.25 MG tablet Take 1 tablet (6.25 mg total) by mouth 2 (two) times daily. 180 tablet 1  . cetirizine (ZYRTEC) 10 MG tablet TAKE ONE TABLET BY MOUTH ONE TIME DAILY  90 tablet 1  . Diclofenac Sodium 2 % SOLN Place 2 sprays onto the skin 2 (two) times daily. 1 Bottle 11  . furosemide (LASIX) 20 MG tablet 1 TAB IN AM AND 2 TABS PM 270 tablet 1  . gabapentin (NEURONTIN) 300 MG capsule Take 300 mg by mouth 4  (four) times daily.     Marland Kitchen losartan (COZAAR) 100 MG tablet TAKE 1 TABLET EVERY DAY 90 tablet 1  . metolazone (ZAROXOLYN) 2.5 MG tablet Take 1 tablet (2.5 mg total) by mouth every other day. 45 tablet 2  . Multiple Vitamin (MULTIVITAMIN) capsule Take 1 capsule by mouth daily.      . polyvinyl alcohol-povidone (REFRESH) 1.4-0.6 % ophthalmic solution 1-2 drops as needed.    . potassium chloride (K-DUR) 10 MEQ tablet Take 2 tablets (20 mEq total) by mouth 2 (two) times daily. 360 tablet 1  . pravastatin (PRAVACHOL) 40 MG tablet Take 1 tablet (40 mg total) by mouth daily. 90 tablet 3  . traMADol (ULTRAM) 50 MG tablet 1 tablet up to 4 times per day 360 tablet 0  . verapamil (VERELAN PM) 240 MG 24 hr capsule Take 1 capsule (240 mg total) by mouth at bedtime. GENERIC ONLY 90 capsule 1  . vitamin B-12 (CYANOCOBALAMIN) 1000 MCG tablet Take 1,000 mcg by mouth daily.      Marland Kitchen warfarin (COUMADIN) 5 MG tablet 1.5 tablets daily or as directed by coumadin clinic 145 tablet 1   No current facility-administered medications for this visit.     Allergies (verified) Amlodipine besylate-valsartan   PAST HISTORY  Family History Family History  Problem Relation Age of Onset  . Arrhythmia Mother     afib  . Alzheimer's disease Mother   . Colon cancer Father   . Heart attack Father 52  . Diabetes Sister   . Arrhythmia Sister     afib    Social History History  Substance Use Topics  . Smoking status: Former Smoker    Quit date: 10/08/1970  . Smokeless tobacco: Not on file  . Alcohol Use: No     Are there smokers in your home (other than you)? No  Risk Factors Current exercise habits: The patient does not participate in regular exercise at present.  Dietary issues discussed: none   Cardiac risk factors: advanced age (older than 5 for men, 70 for women), hypertension, obesity (BMI >= 30 kg/m2) and sedentary lifestyle.  Depression Screen (Note: if answer to either of the following is "Yes", a more  complete depression screening is indicated)   Over the past two weeks, have you felt down, depressed or hopeless? No  Over the past two weeks, have you felt little interest or pleasure in doing things? Yes  Have you lost interest or pleasure in daily life? No  Do you often feel hopeless? No  Do you cry easily over simple problems? No  Activities of Daily Living In your present state of health, do you have any difficulty performing the following activities?:  Driving? Yes Managing money?  Yes Feeding yourself? Yes Getting from bed to chair? Yes  Climbing a flight of stairs? Yes Preparing food and eating?:  No Bathing or showering? No Getting dressed: No Getting to the toilet? No Using the toilet:No Moving around from place to place: No In the past year have you fallen or had a near fall?:Yes, 2 falls in March   Are you sexually active?  No  Do you have more than one partner?  No  Hearing Difficulties: No Do you often ask people to speak up or repeat themselves? No Do you experience ringing or noises in your ears? No Do you have difficulty understanding soft or whispered voices? No   Do you feel that you have a problem with memory? No  Do you often misplace items? No  Do you feel safe at home?  Yes  Cognitive Testing  Alert? Yes  Normal Appearance?Yes  Oriented to person? Yes  Place? Yes   Time? Yes  Recall of three objects?  Yes  Can perform simple calculations? Yes  Displays appropriate judgment?Yes  Can read the correct time from a watch face?Yes   6 CIt score of 2/28    Advanced Directives have been discussed with the patient? Yes  List the Names of Other Physician/Practitioners you currently use: 1.  Dr. Charlton Haws (Cardiology)   Indicate any recent Medical Services you may have received from other than [facility] providers in the past year (date may be approximate).  Immunization History  Administered Date(s) Administered  . Influenza Whole 08/07/2006, 08/05/2007,  07/08/2009, 07/25/2009, 06/30/2010  . Influenza,inj,Quad PF,36+ Mos 06/11/2013, 06/18/2014  . Pneumococcal Conjugate-13 07/19/2014  . Pneumococcal Polysaccharide-23 10/08/2002  . Td 07/25/2009  . Zoster 09/21/2009    Screening Tests Health Maintenance  Topic Date Due  . COLONOSCOPY  10/08/2012  . INFLUENZA VACCINE  05/09/2015  . PNA vac Low Risk Adult (2 of 2 - PPSV23) 07/20/2015  . TETANUS/TDAP  07/26/2019  . DEXA SCAN  Completed  . ZOSTAVAX  Completed    All answers were reviewed with the patient and necessary referrals were made:  METHENEY,CATHERINE, MD   01/31/2015   History reviewed: allergies, current medications, past family history, past medical history, past social history, past surgical history and problem list  Review of Systems A comprehensive review of systems was negative.    Objective:     Vision by Snellen chart: eye exam is UTD  Body mass index is 41.82 kg/(m^2). BP 122/70 mmHg  Pulse 73  Ht 5\' 8"  (1.727 m)  Wt 275 lb (124.739 kg)  BMI 41.82 kg/m2  BP 122/70 mmHg  Pulse 73  Ht 5\' 8"  (1.727 m)  Wt 275 lb (124.739 kg)  BMI 41.82 kg/m2  General Appearance:    Alert, cooperative, no distress, appears stated age  Head:    Normocephalic, without obvious abnormality, atraumatic  Eyes:    PERRL, conjunctiva/corneas clear, EOM's intact, both eyes  Ears:    Normal TM's and external ear canals, both ears  Nose:   Nares normal, septum midline, mucosa normal, no drainage    or sinus tenderness  Throat:   Lips, mucosa, and tongue normal; teeth and gums normal  Neck:   Supple, symmetrical, trachea midline, no adenopathy;    thyroid:  no enlargement/tenderness/nodules; no carotid   bruit or JVD  Back:     Symmetric, no curvature, ROM normal, no CVA tenderness  Lungs:     Clear to auscultation bilaterally, respirations unlabored  Chest Wall:    No tenderness or deformity   Heart:    Regular rate and rhythm, S1 and S2 normal, no murmur, rub  or gallop  Breast  Exam:    No tenderness, masses, or nipple abnormality  Abdomen:     Soft, non-tender, bowel sounds active all four quadrants,    no masses, no organomegaly  Genitalia:    Not performed.   Rectal:    Not performed.    guaiac negative stool  Extremities:   Extremities normal, atraumatic, no cyanosis or edema  Pulses:   2+ and symmetric all extremities  Skin:   Skin color, texture, turgor normal, no rashes or lesions  Lymph nodes:   Cervical, supraclavicular, and axillary nodes normal  Neurologic:   CNII-XII intact, normal strength, sensation and reflexes    throughout       Assessment:     Medicare Wellness Exam       Plan:     During the course of the visit the patient was educated and counseled about appropriate screening and preventive services including:    due for eye exam  - has called to schedule  Went to see GI - they didn't recommend repeat colonscopy.   Discussed breast cancer screening since she is over 75.   Constipation - discussed usind a stool softner or miralax. Call if still not working well.    Atrial fibrillation-her INR level is elevated today but it is improved from previous. Encouraged her to skip dose tonight. We will get blood draw for confirmation. We'll call with results and adjust dose as needed.  Diet review for nutrition referral? Yes ____  Not Indicated _X__   Patient Instructions (the written plan) was given to the patient.  Medicare Attestation I have personally reviewed: The patient's medical and social history Their use of alcohol, tobacco or illicit drugs Their current medications and supplements The patient's functional ability including ADLs,fall risks, home safety risks, cognitive, and hearing and visual impairment Diet and physical activities Evidence for depression or mood disorders  The patient's weight, height, BMI, and visual acuity have been recorded in the chart.  I have made referrals, counseling, and provided education to  the patient based on review of the above and I have provided the patient with a written personalized care plan for preventive services.     METHENEY,CATHERINE, MD   01/31/2015

## 2015-02-01 ENCOUNTER — Ambulatory Visit: Payer: Self-pay | Admitting: Family Medicine

## 2015-02-01 LAB — PROTIME-INR
INR: 3.66 — AB (ref <1.50)
PROTHROMBIN TIME: 36.4 s — AB (ref 11.6–15.2)

## 2015-02-03 ENCOUNTER — Other Ambulatory Visit: Payer: Self-pay

## 2015-02-03 DIAGNOSIS — I509 Heart failure, unspecified: Secondary | ICD-10-CM

## 2015-02-03 NOTE — Patient Outreach (Addendum)
[facility]) Care Management  02/03/2015  Charlotte [name] 1936-08-22 [id number]   SUBJECTIVE:  Patient reports she still has consent form but keeps forgetting to send back to [facility].  Patient states she will send consent as soon as possible.  Patient verbalized agreement to transition to health coach for continued disease management.   Patient denied any recent falls.   ASSESSMENT:  Continued disease management/education for hear failure.  Patient will benefit from fall safety education. RNCM reinforced fall safety with patient.   RNCM reinforced with patient importance of written consent completion for continued services.  Patient verbalized understanding.   PLAN:  RNCM will refer patient to health coach. Patient will mail consent back to [facility] care management.  George Ina RN,BSN,CCM [facility] Telephonic Care Coordinator [phone]

## 2015-02-04 DIAGNOSIS — E785 Hyperlipidemia, unspecified: Secondary | ICD-10-CM | POA: Diagnosis not present

## 2015-02-05 LAB — BASIC METABOLIC PANEL
BUN: 32 mg/dL — AB (ref 6–23)
CO2: 29 mEq/L (ref 19–32)
Calcium: 9.3 mg/dL (ref 8.4–10.5)
Chloride: 94 mEq/L — ABNORMAL LOW (ref 96–112)
Creat: 0.89 mg/dL (ref 0.50–1.10)
GLUCOSE: 106 mg/dL — AB (ref 70–99)
POTASSIUM: 3.3 meq/L — AB (ref 3.5–5.3)
Sodium: 137 mEq/L (ref 135–145)

## 2015-02-05 LAB — LIPID PANEL
CHOL/HDL RATIO: 3.3 ratio
Cholesterol: 176 mg/dL (ref 0–200)
HDL: 53 mg/dL (ref >=46)
LDL Cholesterol: 97 mg/dL (ref 0–99)
Triglycerides: 131 mg/dL (ref <150)
VLDL: 26 mg/dL (ref 0–40)

## 2015-02-08 NOTE — Addendum Note (Signed)
Addended by: Collie Siad on: 02/08/2015 08:26 AM   Modules accepted: Orders

## 2015-02-09 ENCOUNTER — Other Ambulatory Visit: Payer: Self-pay

## 2015-02-09 DIAGNOSIS — K5900 Constipation, unspecified: Secondary | ICD-10-CM

## 2015-02-09 NOTE — Patient Outreach (Signed)
Patient transferred from case management to Health Coach Disease Management for CHF as of 02-03-15.  Called patient to introduce myself and to arrange for monthly phone calls.  Patient reported she was not feeling as well today as usual, but that her weight has not increased significantly (only .4 pounds since yesterday), and that she has no increase in swelling- only trace edema in ankles.  Reports she has shortness of breath with activity in the home, and that it may be "a little worse" today.  Patient takes Lasix 20mg  once in the morning and twice in the evening around 6PM.  States she has to get up to bathroom multiple times at night.  Also states she has not taken her am dose of Lasix at time of my call.  Instructed her to take her am dose right away.  Also instructed her to try taking her evening dose of Lasix around 4PM to see if that will decrease night time trips to the bathroom.  Patient reports she fell twice during the month of March.  Provided teaching on fall precautions and reviewed instructions that were previously mailed to her.  She reports that her daughter has helped her improve the safety of her environment to prevent falls.  She reports she has a copy of her CHF Action Plan.  Health Coach reviewed 'Yellow Zone' symptoms to watch for, and instructed her to call her MD if these symptoms are present.  Scheduled first monthly appointment with Health Coach for near the first of June.  Patient states she has not yet mailed consents forms to [facility].  Reinforced need to sign and mail forms.  She stated she will try to mail them today.  [name], RN, MSN [facility] Health Coach [phone] Fax [phone]

## 2015-02-14 ENCOUNTER — Ambulatory Visit (INDEPENDENT_AMBULATORY_CARE_PROVIDER_SITE_OTHER): Payer: Medicare HMO | Admitting: Family Medicine

## 2015-02-14 VITALS — BP 139/71 | HR 71

## 2015-02-14 DIAGNOSIS — Z7901 Long term (current) use of anticoagulants: Secondary | ICD-10-CM | POA: Diagnosis not present

## 2015-02-14 DIAGNOSIS — I4891 Unspecified atrial fibrillation: Secondary | ICD-10-CM | POA: Diagnosis not present

## 2015-02-14 DIAGNOSIS — Z0000 Encounter for general adult medical examination without abnormal findings: Secondary | ICD-10-CM | POA: Diagnosis not present

## 2015-02-14 LAB — POCT INR: INR: 3.5

## 2015-02-15 LAB — BASIC METABOLIC PANEL WITH GFR
BUN: 18 mg/dL (ref 6–23)
CALCIUM: 9.2 mg/dL (ref 8.4–10.5)
CHLORIDE: 103 meq/L (ref 96–112)
CO2: 28 meq/L (ref 19–32)
Creat: 0.83 mg/dL (ref 0.50–1.10)
GFR, EST NON AFRICAN AMERICAN: 68 mL/min
GFR, Est African American: 79 mL/min
Glucose, Bld: 107 mg/dL — ABNORMAL HIGH (ref 70–99)
Potassium: 4.6 mEq/L (ref 3.5–5.3)
SODIUM: 140 meq/L (ref 135–145)

## 2015-02-28 ENCOUNTER — Ambulatory Visit (INDEPENDENT_AMBULATORY_CARE_PROVIDER_SITE_OTHER): Payer: Medicare HMO | Admitting: Family Medicine

## 2015-02-28 DIAGNOSIS — I4891 Unspecified atrial fibrillation: Secondary | ICD-10-CM

## 2015-02-28 LAB — POCT INR: INR: 3.4

## 2015-03-01 NOTE — Progress Notes (Signed)
Pt advised of dosage change and f/u appt was scheduled.

## 2015-03-08 ENCOUNTER — Encounter: Payer: Self-pay | Admitting: Sports Medicine

## 2015-03-08 ENCOUNTER — Ambulatory Visit (INDEPENDENT_AMBULATORY_CARE_PROVIDER_SITE_OTHER): Payer: Medicare HMO | Admitting: Sports Medicine

## 2015-03-08 VITALS — BP 123/71 | HR 75 | Ht 63.0 in | Wt 277.0 lb

## 2015-03-08 DIAGNOSIS — M19071 Primary osteoarthritis, right ankle and foot: Secondary | ICD-10-CM | POA: Diagnosis not present

## 2015-03-08 DIAGNOSIS — M19072 Primary osteoarthritis, left ankle and foot: Secondary | ICD-10-CM | POA: Diagnosis not present

## 2015-03-08 NOTE — Progress Notes (Signed)
  Subjective:    CC: follow-up  HPI: Bilateral ankle osteoarthritis: Charlotte [name] returns, she had an injection over 2 months ago with both ankles that had a fantastic response, unfortunately she has had a return of pain, moderate, persistent, localized at the talocrural joint bilaterally.  Past medical history, Surgical history, Family history not pertinant except as noted below, Social history, Allergies, and medications have been entered into the medical record, reviewed, and no changes needed.   Review of Systems: No fevers, chills, night sweats, weight loss, chest pain, or shortness of breath.   Objective:    General: Well Developed, well nourished, and in no acute distress.  Neuro: Alert and oriented x3, extra-ocular muscles intact, sensation grossly intact.  HEENT: Normocephalic, atraumatic, pupils equal round reactive to light, neck supple, no masses, no lymphadenopathy, thyroid nonpalpable.  Skin: Warm and dry, no rashes. Cardiac: Regular rate and rhythm, no murmurs rubs or gallops, no lower extremity edema.  Respiratory: Clear to auscultation bilaterally. Not using accessory muscles, speaking in full sentences.  Procedure: Real-time Ultrasound Guided Injection of left talocrural joint Device: GE Logiq E  Verbal informed consent obtained.  Time-out conducted.  Noted no overlying erythema, induration, or other signs of local infection.  Skin prepped in a sterile fashion.  Local anesthesia: Topical Ethyl chloride.  With sterile technique and under real time ultrasound guidance:  1 mL kenalog 40, 3 mL lidocaine injected easily. Completed without difficulty  Pain immediately resolved suggesting accurate placement of the medication.  Advised to call if fevers/chills, erythema, induration, drainage, or persistent bleeding.  Images permanently stored and available for review in the ultrasound unit.  Impression: Technically successful ultrasound guided injection.  Procedure: Real-time  Ultrasound Guided Injection of right talocrural joint Device: GE Logiq E  Verbal informed consent obtained.  Time-out conducted.  Noted no overlying erythema, induration, or other signs of local infection.  Skin prepped in a sterile fashion.  Local anesthesia: Topical Ethyl chloride.  With sterile technique and under real time ultrasound guidance:  1 mL kenalog 40, 3 mL lidocaine injected easily. Completed without difficulty  Pain immediately resolved suggesting accurate placement of the medication.  Advised to call if fevers/chills, erythema, induration, drainage, or persistent bleeding.  Images permanently stored and available for review in the ultrasound unit.  Impression: Technically successful ultrasound guided injection.   Impression and Recommendations:

## 2015-03-08 NOTE — Assessment & Plan Note (Addendum)
Bilateral injection as above. Previous injection did not last long, she has custom orthotics. At this point I am going to refer her for consideration of ankle replacement versus fusion.

## 2015-03-11 ENCOUNTER — Other Ambulatory Visit: Payer: Self-pay

## 2015-03-11 NOTE — Patient Outreach (Signed)
[facility]) Care Management  03/11/2015  GRASYN MARCIAL 1936/02/08 [id number]  Follow-up assessment with patient regarding self-management of CHF.  Patient reports actively working on decreasing salt in her diet.  She is keeping a log of her BP and daily weights, and reports she has been "in the green zone".  Patient has arthritis in both ankles, and saw her orthopedist recently for bilateral steroid injections.  States she continues to have pain in her ankles and lower legs, for which she takes Ultram.  She is awaiting an appointment with an orthopedic surgeon for a consult about replacement surgery.    Health Coach will continue monthly telephonic contacts for education and support for self-management of CHF.  Patient has [facility] Health Coach and 24 hour nurse line phone numbers.  [name], RN, MSN [facility] Health Coach [phone] Fax [phone]

## 2015-03-15 ENCOUNTER — Ambulatory Visit (INDEPENDENT_AMBULATORY_CARE_PROVIDER_SITE_OTHER): Payer: Medicare HMO | Admitting: Family Medicine

## 2015-03-15 DIAGNOSIS — I4891 Unspecified atrial fibrillation: Secondary | ICD-10-CM | POA: Diagnosis not present

## 2015-03-15 LAB — POCT INR: INR: 2

## 2015-03-16 NOTE — Progress Notes (Signed)
Pt informed of dosage and scheduled for f/u. No further questions.

## 2015-03-29 ENCOUNTER — Ambulatory Visit (INDEPENDENT_AMBULATORY_CARE_PROVIDER_SITE_OTHER): Payer: Medicare HMO | Admitting: Family Medicine

## 2015-03-29 VITALS — BP 108/63 | HR 80

## 2015-03-29 DIAGNOSIS — I4891 Unspecified atrial fibrillation: Secondary | ICD-10-CM

## 2015-03-29 DIAGNOSIS — Z7901 Long term (current) use of anticoagulants: Secondary | ICD-10-CM | POA: Diagnosis not present

## 2015-03-29 LAB — POCT INR: INR: 1.6

## 2015-03-30 NOTE — Progress Notes (Signed)
Pt advised of dosage change, verbalized understanding. F/U appt made.

## 2015-04-07 ENCOUNTER — Other Ambulatory Visit: Payer: Self-pay

## 2015-04-07 NOTE — Patient Outreach (Addendum)
[facility]) Care Management  Charlotte [name]  04/07/2015   Charlotte [name] January 12, 1936 [id number]   Telephone assessment completed today for chronic disease management for heart failure.  Subjective: Patient reports doing well.  Weights have been stable.  No edema or breathing problems reported.                      States she sometimes skips her morning lasix if she is going out. Reports following low sodium                      diet guidelines most of time.  No recent falls.  Objective: Patient was able to discuss components of heart failure action plan and low sodium diet.  Current Medications:  Current Outpatient Prescriptions  Medication Sig Dispense Refill  . acetaminophen (TYLENOL) 650 MG CR tablet Take 2 tablets (1,300 mg total) by mouth every 8 (eight) hours as needed for pain. 90 tablet 3  . Calcium Carb-Cholecalciferol (CALCIUM 600/VITAMIN D3) 600-800 MG-UNIT TABS Take 1 tablet by mouth 2 (two) times daily.    . carvedilol (COREG) 6.25 MG tablet Take 1 tablet (6.25 mg total) by mouth 2 (two) times daily. 180 tablet 1  . cetirizine (ZYRTEC) 10 MG tablet TAKE ONE TABLET BY MOUTH ONE TIME DAILY  90 tablet 1  . Diclofenac Sodium 2 % SOLN Place 2 sprays onto the skin 2 (two) times daily. 1 Bottle 11  . furosemide (LASIX) 20 MG tablet 1 TAB IN AM AND 2 TABS PM 270 tablet 1  . gabapentin (NEURONTIN) 300 MG capsule Take 300 mg by mouth 4 (four) times daily.     Marland Kitchen losartan (COZAAR) 100 MG tablet TAKE 1 TABLET EVERY DAY 90 tablet 1  . metolazone (ZAROXOLYN) 2.5 MG tablet Take 1 tablet (2.5 mg total) by mouth every other day. 45 tablet 2  . Multiple Vitamin (MULTIVITAMIN) capsule Take 1 capsule by mouth daily.      . polyvinyl alcohol-povidone (REFRESH) 1.4-0.6 % ophthalmic solution 1-2 drops as needed.    . potassium chloride (K-DUR) 10 MEQ tablet Take 2 tablets (20 mEq total) by mouth 2 (two) times daily. 360 tablet 1  . pravastatin (PRAVACHOL) 40 MG tablet Take 1  tablet (40 mg total) by mouth daily. 90 tablet 3  . traMADol (ULTRAM) 50 MG tablet 1 tablet up to 4 times per day 360 tablet 0  . verapamil (VERELAN PM) 240 MG 24 hr capsule Take 1 capsule (240 mg total) by mouth at bedtime. GENERIC ONLY 90 capsule 1  . vitamin B-12 (CYANOCOBALAMIN) 1000 MCG tablet Take 1,000 mcg by mouth daily.      Marland Kitchen warfarin (COUMADIN) 5 MG tablet 1.5 tablets daily or as directed by coumadin clinic 145 tablet 1   No current facility-administered medications for this visit.    Functional Status:  In your present state of health, do you have any difficulty performing the following activities: 04/07/2015 03/11/2015  Hearing? N N  Vision? N N  Difficulty concentrating or making decisions? N N  Walking or climbing stairs? Y Y  Dressing or bathing? N N  Doing errands, shopping? N N  Preparing Food and eating ? N N  Using the Toilet? N N  In the past six months, have you accidently leaked urine? N N  Do you have problems with loss of bowel control? N N  Managing your Medications? N N  Managing your Finances? N N  Housekeeping or managing your Housekeeping? N N    Fall/Depression Screening: PHQ 2/9 Scores 04/07/2015 06/18/2014 06/11/2013  PHQ - 2 Score 0 0 0   [facility] CM Care Plan Problem One        Patient Outreach Telephone from 04/07/2015 in [place] Problem One  knowledge deficit related to congestive heart failiure diagnosis   Care Plan for Problem One  Active   [facility] Long Term Goal (31-90 days)  Patient will verbalize all 3 congestive heart failure zones within 60 days   [facility] Long Term Goal Start Date  11/24/14   [facility] Long Term Goal Met Date  04/07/15   Interventions for Problem One Long Term Goal  Using teachback method, patient able to explain action plan [Reinforced teaching re CHF Action Plan.]   [facility] CM Short Term Goal #1 (0-30 days)  patient will report 3 low sodium food items within 30 days   [facility] CM Short Term Goal #1 Start Date  03/11/15    Interventions for Short Term Goal #1  Reinforced guidelines for low sodium diet.    [facility] CM Care Plan Problem Two        Patient Outreach Telephone from 04/07/2015 in [place] Problem Two  high risk for falls related to recent fall   Care Plan for Problem Two  Not Active   [facility]N CM Short Term Goal #1 (0-30 days)  Patient will report no falls within the next 30 days   [facility]N CM Short Term Goal #1 Met Date   03/11/15   Interventions for Short Term Goal #2   -- [No falls reported- reinforced ongoing need for safety.]     Assessment: Patient has increased her knowledge of CHF but will benefit from ongoing reinforcement                        of education and support from monthly telephonic assessments.                        She has remained free of falls. Plan: Scheduled visit for 04-25-15 for reinforcement of CHF self-management plan.  Candie Mile, RN, MSN [place] [phone] Fax [phone]

## 2015-04-12 ENCOUNTER — Ambulatory Visit (INDEPENDENT_AMBULATORY_CARE_PROVIDER_SITE_OTHER): Payer: Medicare HMO | Admitting: Family Medicine

## 2015-04-12 VITALS — BP 115/60 | HR 69

## 2015-04-12 DIAGNOSIS — I4891 Unspecified atrial fibrillation: Secondary | ICD-10-CM

## 2015-04-12 LAB — PROTIME-INR
INR: 3.46 — AB (ref <1.50)
PROTHROMBIN TIME: 34.8 s — AB (ref 11.6–15.2)

## 2015-04-12 LAB — POCT INR: INR: 4.2

## 2015-04-13 NOTE — Progress Notes (Signed)
Pt advised of dosage change and f/u appt made. No further questions.

## 2015-04-25 ENCOUNTER — Ambulatory Visit (INDEPENDENT_AMBULATORY_CARE_PROVIDER_SITE_OTHER): Payer: Commercial Managed Care - HMO | Admitting: Family Medicine

## 2015-04-25 DIAGNOSIS — I4891 Unspecified atrial fibrillation: Secondary | ICD-10-CM

## 2015-04-25 LAB — POCT INR: INR: 2.9

## 2015-04-25 NOTE — Progress Notes (Signed)
Left detailed message.   

## 2015-05-04 ENCOUNTER — Other Ambulatory Visit: Payer: Self-pay | Admitting: Cardiovascular Disease

## 2015-05-04 ENCOUNTER — Other Ambulatory Visit: Payer: Self-pay | Admitting: Family Medicine

## 2015-05-09 ENCOUNTER — Ambulatory Visit (INDEPENDENT_AMBULATORY_CARE_PROVIDER_SITE_OTHER): Payer: Commercial Managed Care - HMO | Admitting: Family Medicine

## 2015-05-09 DIAGNOSIS — I4891 Unspecified atrial fibrillation: Secondary | ICD-10-CM

## 2015-05-09 LAB — POCT INR: INR: 3.7

## 2015-05-09 NOTE — Progress Notes (Signed)
Pt advised of dosage and f/u appt scheduled.

## 2015-05-10 ENCOUNTER — Ambulatory Visit: Payer: Self-pay

## 2015-05-10 ENCOUNTER — Other Ambulatory Visit: Payer: Self-pay

## 2015-05-10 DIAGNOSIS — K5909 Other constipation: Secondary | ICD-10-CM | POA: Insufficient documentation

## 2015-05-10 DIAGNOSIS — K5900 Constipation, unspecified: Secondary | ICD-10-CM

## 2015-05-10 NOTE — Patient Outreach (Signed)
[facility]) Care Management  [facility] Care Manager  05/10/2015   SHERLYNE HALKER 1936-01-31 [id number]   Telephonic assessment completed today by RN Health Coach regarding self-management of Congestive Heart Failure.  Patient demonstrated knowledge of CHF Action Plan, using the 'stoplight' tool.  She also reports adherence to a low sodium diet, and is using salt substitutes and reading labels for sodium content.  Her weight was increased to 276.2 pounds from 273.6 approximately one month ago.  She reports this has been a gradual increase, and that the swelling in her ankles- which she attributes to arthritis- is unchanged.   Safety and fall prevention has been a focus since she suffered a fall in March 2016.  She has been instructed about safety in the home and fall prevention measures.  She has not had a subsequent fall.  Ms. Saner chief concern today is constipation due to analgesic use for her arthritis pain in her ankles.  Education was provided regarding management of constipation. She is currently being seen by an orthopedist, and has discussed the possibility of ankle replacement surgery.  She wants to avoid surgery if possible.  Monthly follow-up with [facility] RN Health Coach will be continued for reinforcement of education provided, and support in reaching her goals.   Current Medications:  Current Outpatient Prescriptions  Medication Sig Dispense Refill  . acetaminophen (TYLENOL) 650 MG CR tablet Take 2 tablets (1,300 mg total) by mouth every 8 (eight) hours as needed for pain. 90 tablet 3  . Calcium Carb-Cholecalciferol (CALCIUM 600/VITAMIN D3) 600-800 MG-UNIT TABS Take 1 tablet by mouth 2 (two) times daily.    . carvedilol (COREG) 6.25 MG tablet TAKE 1 TABLET TWICE DAILY 180 tablet 1  . furosemide (LASIX) 20 MG tablet 1 TAB IN AM AND 2 TABS PM 270 tablet 1  . gabapentin (NEURONTIN) 300 MG capsule Take 300 mg by mouth 4 (four) times daily.     Marland Kitchen losartan (COZAAR) 100 MG  tablet TAKE 1 TABLET EVERY DAY 90 tablet 1  . metolazone (ZAROXOLYN) 2.5 MG tablet TAKE 1 TABLET EVERY OTHER DAY 45 tablet 2  . Multiple Vitamin (MULTIVITAMIN) capsule Take 1 capsule by mouth daily.      . polyvinyl alcohol-povidone (REFRESH) 1.4-0.6 % ophthalmic solution 1-2 drops as needed.    . potassium chloride (K-DUR) 10 MEQ tablet Take 2 tablets (20 mEq total) by mouth 2 (two) times daily. 360 tablet 1  . pravastatin (PRAVACHOL) 40 MG tablet Take 1 tablet (40 mg total) by mouth daily. 90 tablet 3  . traMADol (ULTRAM) 50 MG tablet 1 tablet up to 4 times per day 360 tablet 0  . verapamil (VERELAN PM) 240 MG 24 hr capsule Take 1 capsule (240 mg total) by mouth at bedtime. GENERIC ONLY 90 capsule 1  . vitamin B-12 (CYANOCOBALAMIN) 1000 MCG tablet Take 1,000 mcg by mouth daily.      Marland Kitchen warfarin (COUMADIN) 5 MG tablet TAKE 1 AND 1/2 TABLETS EVERY DAY  OR AS DIRECTED  BY  COUMADIN  CLINIC 145 tablet 1  . cetirizine (ZYRTEC) 10 MG tablet TAKE ONE TABLET BY MOUTH ONE TIME DAILY  (Patient not taking: Reported on 05/10/2015) 90 tablet 1  . Diclofenac Sodium 2 % SOLN Place 2 sprays onto the skin 2 (two) times daily. (Patient not taking: Reported on 05/10/2015) 1 Bottle 11   No current facility-administered medications for this visit.    Functional Status:  In your present state of health, do you have  any difficulty performing the following activities: 05/10/2015 05/10/2015  Hearing? - N  Vision? - N  Difficulty concentrating or making decisions? - N  Walking or climbing stairs? - Y  Dressing or bathing? - N  Doing errands, shopping? - N  Quarry manager and eating ? - N  Using the Toilet? - N  In the past six months, have you accidently leaked urine? - Y  Do you have problems with loss of bowel control? (No Data) N  Managing your Medications? - N  Managing your Finances? - N  Housekeeping or managing your Housekeeping? - N    Fall/Depression Screening: PHQ 2/9 Scores 05/10/2015 04/07/2015 06/18/2014  06/11/2013  PHQ - 2 Score 0 0 0 0      [name], RN, MSN [facility] Health Coach [phone] Fax [phone]

## 2015-05-16 ENCOUNTER — Other Ambulatory Visit: Payer: Self-pay | Admitting: *Deleted

## 2015-05-16 MED ORDER — FUROSEMIDE 20 MG PO TABS: ORAL_TABLET | ORAL | Status: DC

## 2015-05-23 ENCOUNTER — Ambulatory Visit (INDEPENDENT_AMBULATORY_CARE_PROVIDER_SITE_OTHER): Payer: Commercial Managed Care - HMO | Admitting: Family Medicine

## 2015-05-23 DIAGNOSIS — I4891 Unspecified atrial fibrillation: Secondary | ICD-10-CM

## 2015-05-23 LAB — POCT INR: INR: 2.2

## 2015-05-24 NOTE — Progress Notes (Signed)
Patient advised of recommendations.  

## 2015-06-07 ENCOUNTER — Other Ambulatory Visit: Payer: Medicare HMO

## 2015-06-07 NOTE — Patient Outreach (Signed)
[facility]) Care Management  06/07/2015  Charlotte [name] Jun 16, 1936 [id number]   Patient called to request assessment be changed to tomorrow 06-08-15 due to family illness.  [name], RN, MSN RN [employer] [phone] Fax [phone]

## 2015-06-08 ENCOUNTER — Other Ambulatory Visit: Payer: Self-pay

## 2015-06-08 ENCOUNTER — Ambulatory Visit: Payer: Self-pay

## 2015-06-08 NOTE — Patient Outreach (Signed)
[facility]) Care Management  [place]  06/08/2015   Charlotte [name] Jul 17, 1936 [id number]   Telephonic assessment completed today.  Patient's primary concern today is on-going problem with constipation due to use of Tramadol ordered for pain in ankles.  Patient was to consult with an orthopedist about possible ankle replacement surgery, but she has decided against surgery at this time.  Episodic urinary incontinence continues, especially during the night.  Patient is weighing daily and recording her weight, which is 170.4 pounds today, down from 176.2 last month.  She is abiding by low sodium diet guidelines, and reports trace edema in her ankles.  She is able to demonstrate knowledge of CHF Action Plan, and states she is in 'Green Zone'.  Current Medications:  Current Outpatient Prescriptions  Medication Sig Dispense Refill  . acetaminophen (TYLENOL) 650 MG CR tablet Take 2 tablets (1,300 mg total) by mouth every 8 (eight) hours as needed for pain. 90 tablet 3  . Calcium Carb-Cholecalciferol (CALCIUM 600/VITAMIN D3) 600-800 MG-UNIT TABS Take 1 tablet by mouth 2 (two) times daily.    . carvedilol (COREG) 6.25 MG tablet TAKE 1 TABLET TWICE DAILY 180 tablet 1  . cetirizine (ZYRTEC) 10 MG tablet TAKE ONE TABLET BY MOUTH ONE TIME DAILY  90 tablet 1  . Diclofenac Sodium 2 % SOLN Place 2 sprays onto the skin 2 (two) times daily. 1 Bottle 11  . furosemide (LASIX) 20 MG tablet 1 TAB IN AM AND 2 TABS PM 270 tablet 1  . gabapentin (NEURONTIN) 300 MG capsule Take 300 mg by mouth 4 (four) times daily.     Marland Kitchen losartan (COZAAR) 100 MG tablet TAKE 1 TABLET EVERY DAY 90 tablet 1  . metolazone (ZAROXOLYN) 2.5 MG tablet TAKE 1 TABLET EVERY OTHER DAY 45 tablet 2  . Multiple Vitamin (MULTIVITAMIN) capsule Take 1 capsule by mouth daily.      . polyvinyl alcohol-povidone (REFRESH) 1.4-0.6 % ophthalmic solution 1-2 drops as needed.    . potassium chloride (K-DUR) 10 MEQ tablet Take 2  tablets (20 mEq total) by mouth 2 (two) times daily. 360 tablet 1  . pravastatin (PRAVACHOL) 40 MG tablet Take 1 tablet (40 mg total) by mouth daily. 90 tablet 3  . traMADol (ULTRAM) 50 MG tablet 1 tablet up to 4 times per day 360 tablet 0  . verapamil (VERELAN PM) 240 MG 24 hr capsule Take 1 capsule (240 mg total) by mouth at bedtime. GENERIC ONLY 90 capsule 1  . vitamin B-12 (CYANOCOBALAMIN) 1000 MCG tablet Take 1,000 mcg by mouth daily.      Marland Kitchen warfarin (COUMADIN) 5 MG tablet TAKE 1 AND 1/2 TABLETS EVERY DAY  OR AS DIRECTED  BY  COUMADIN  CLINIC 145 tablet 1   No current facility-administered medications for this visit.    Functional Status:  In your present state of health, do you have any difficulty performing the following activities: 06/08/2015 05/10/2015  Hearing? N -  Vision? N -  Difficulty concentrating or making decisions? N -  Walking or climbing stairs? Y -  Dressing or bathing? N -  Doing errands, shopping? N -  Preparing Food and eating ? N -  Using the Toilet? N -  In the past six months, have you accidently leaked urine? Y -  Do you have problems with loss of bowel control? N (No Data)  Managing your Medications? N -  Managing your Finances? N -  Housekeeping or managing your Housekeeping? N -  Fall/Depression Screening: PHQ 2/9 Scores 05/10/2015 04/07/2015 06/18/2014 06/11/2013  PHQ - 2 Score 0 0 0 0   THN CM Care Plan Problem One        Most Recent Value   Care Plan Problem One  knowledge deficit related to congestive heart failiure diagnosis   Role Documenting the Problem One  Health Coach [Patient has recieved CHF educational materials.]   Care Plan for Problem One  Active   THN CM Short Term Goal #1 Met Date  06/08/15   Interventions for Short Term Goal #1  Patient able to discuss low salt diet guidelines and states she is following same.    THN CM Care Plan Problem Two        Most Recent Value   Care Plan Problem Two  high risk for falls related to recent fall    Role Documenting the Problem [place] for Problem Two  Not Active   THN CM Short Term Goal #1 (0-30 days)  Patient will report no falls within the next 30 days   THN CM Short Term Goal #1 Met Date   03/11/15   Interventions for Short Term Goal #2   No recent falls.  Education provided on fall prevention when OOB to bathroom.    THN CM Care Plan Problem Three        Most Recent Value   Care Plan Problem Three  Constipation related to pain medication use.   Role Documenting the Problem [place] for Problem Three  Active   THN CM Short Term Goal #1 (0-30 days)  -- [Patient will report BM every 2-3 days within 30 days.]   THN CM Short Term Goal #1 Start Date  05/10/15   Interventions for Short Term Goal #1  -- [Education provided re managing constipation.]      Assessment: CHF appears to be stable at this time.  Patient has knowledge of CHF Action Plan, and is adhering                        to low salt diet.    Plan: Patient will continue to self-manage her CHF, and requests ongoing support and reinforcement of                     education by RN Health Coach.                   Assessment scheduled for follow-up on Sept 27th.  Candie Mile, RN, MSN [place] [phone] Fax [phone]

## 2015-06-09 ENCOUNTER — Other Ambulatory Visit: Payer: Self-pay

## 2015-06-09 DIAGNOSIS — M19071 Primary osteoarthritis, right ankle and foot: Secondary | ICD-10-CM

## 2015-06-09 DIAGNOSIS — M19072 Primary osteoarthritis, left ankle and foot: Principal | ICD-10-CM

## 2015-06-09 MED ORDER — TRAMADOL HCL 50 MG PO TABS: ORAL_TABLET | ORAL | Status: DC

## 2015-06-14 ENCOUNTER — Ambulatory Visit (INDEPENDENT_AMBULATORY_CARE_PROVIDER_SITE_OTHER): Payer: Commercial Managed Care - HMO | Admitting: Family Medicine

## 2015-06-14 VITALS — BP 152/75

## 2015-06-14 DIAGNOSIS — I4891 Unspecified atrial fibrillation: Secondary | ICD-10-CM

## 2015-06-14 LAB — POCT INR: INR: 4.7

## 2015-06-14 NOTE — Progress Notes (Signed)
   Subjective:    Patient ID: Charlotte [name], female    DOB: 10/13/1935, 79 y.o.   MRN: [id number]  HPI    Review of Systems     Objective:   Physical Exam        Assessment & Plan:  Because INR was greater than 4.0 she will need to go to the lab for confirmation of her INR.  [name], MD

## 2015-06-15 NOTE — Progress Notes (Signed)
Patient advised to return for a lab draw.

## 2015-06-16 ENCOUNTER — Ambulatory Visit: Payer: Self-pay | Admitting: Family Medicine

## 2015-06-16 ENCOUNTER — Telehealth: Payer: Self-pay | Admitting: *Deleted

## 2015-06-16 LAB — PROTIME-INR
INR: 3.5 — AB (ref <1.50)
PROTHROMBIN TIME: 35.7 s — AB (ref 11.6–15.2)

## 2015-06-16 NOTE — Telephone Encounter (Signed)
lvm w/following information.:Decrease dose. 5mg  on Thursday and 7.5 mg all other days.  Recheck in 2 week. Laureen Ochs, Viann Shove

## 2015-06-20 ENCOUNTER — Encounter: Payer: Self-pay | Admitting: Family Medicine

## 2015-06-20 ENCOUNTER — Ambulatory Visit (INDEPENDENT_AMBULATORY_CARE_PROVIDER_SITE_OTHER): Payer: Commercial Managed Care - HMO | Admitting: Family Medicine

## 2015-06-20 VITALS — BP 113/60 | HR 74 | Temp 98.4°F | Ht 63.0 in | Wt 270.0 lb

## 2015-06-20 DIAGNOSIS — I10 Essential (primary) hypertension: Secondary | ICD-10-CM | POA: Diagnosis not present

## 2015-06-20 DIAGNOSIS — R7301 Impaired fasting glucose: Secondary | ICD-10-CM

## 2015-06-20 DIAGNOSIS — I4891 Unspecified atrial fibrillation: Secondary | ICD-10-CM

## 2015-06-20 DIAGNOSIS — Z23 Encounter for immunization: Secondary | ICD-10-CM

## 2015-06-20 LAB — POCT GLYCOSYLATED HEMOGLOBIN (HGB A1C): HEMOGLOBIN A1C: 6.2

## 2015-06-20 NOTE — Progress Notes (Signed)
   Subjective:    Patient ID: Charlotte [name], female    DOB: [birth year], 79 y.o.   MRN: [id number]  HPI Hypertension- Pt denies chest pain, SOB, dizziness, or heart palpitations.  Taking meds as directed w/o problems.  Denies medication side effects.    IFG - she hasn't been eating as well the last couple of weeks as her brother has been in the hospital but before that she really was trying to cut back on her carbs. She has lost about 3 pounds.Marland Kitchen    afib- Her INR was 3.6s owe decreased her dose.  Doing well overall. She denies any chest pain or shortness of breath. She's overall asymptomatic.   Review of Systems     Objective:   Physical Exam  Constitutional: She is oriented to person, place, and time. She appears well-developed and well-nourished.  HENT:  Head: Normocephalic and atraumatic.  Cardiovascular: Normal rate, regular rhythm and normal heart sounds.   Pulmonary/Chest: Effort normal and breath sounds normal.  Neurological: She is alert and oriented to person, place, and time.  Skin: Skin is warm and dry.  Psychiatric: She has a normal mood and affect. Her behavior is normal.          Assessment & Plan:  HTN  - well controlled.  Continue current regimen. F/U in 6 months.   IFG - increased.  A1C has inc to 6.2.  F/U in 4 months. She has lost a few lbs since May. Great job. Continue to work Radiographer, therapeutic and exerise.   A fib -  Stable. See intake regulation flowsheet for adjustments. To follow up in one week for repeat Coumadin check.

## 2015-06-30 ENCOUNTER — Ambulatory Visit (INDEPENDENT_AMBULATORY_CARE_PROVIDER_SITE_OTHER): Payer: Commercial Managed Care - HMO | Admitting: Family Medicine

## 2015-06-30 VITALS — BP 119/71 | HR 82

## 2015-06-30 DIAGNOSIS — I4891 Unspecified atrial fibrillation: Secondary | ICD-10-CM

## 2015-06-30 LAB — POCT INR: INR: 2.7

## 2015-06-30 NOTE — Progress Notes (Signed)
Left information on Pt's voicemail, advised of dosage and provided callback for f/u scheduling.

## 2015-07-01 ENCOUNTER — Other Ambulatory Visit: Payer: Self-pay | Admitting: Cardiovascular Disease

## 2015-07-05 ENCOUNTER — Ambulatory Visit: Payer: Commercial Managed Care - HMO

## 2015-07-06 ENCOUNTER — Ambulatory Visit: Payer: Self-pay

## 2015-07-06 ENCOUNTER — Other Ambulatory Visit: Payer: Self-pay

## 2015-07-06 NOTE — Patient Outreach (Signed)
[facility]) Care Management  [place]  07/06/2015   Charlotte [name] 07-Sep-1936 [id number]  Patient reports she is doing well.  Is adhering to low salt diet guidelines, but admits it is a struggle.  Her weight has decreased to 268.6, which she is pleased about.  She reports slight swelling of lower extremities but no sudden weight gains, and no new problems with shortness of breath, stating she has been in the "Green Zone" this month.  Pain in her ankles remains an issue.  She is using Tramadol and Gabapentin three times a day, and reports a pain rating of four during our conversation.  Reports at its worst, pain is a 6.  She has an October appointment with her PCP and wants to discuss pain management issues at that time.  She is managing constipation with Metamucil. Nocturia continues with multiple trips up to bathroom most nights.   Current Medications:  Current Outpatient Prescriptions  Medication Sig Dispense Refill  . acetaminophen (TYLENOL) 650 MG CR tablet Take 2 tablets (1,300 mg total) by mouth every 8 (eight) hours as needed for pain. 90 tablet 3  . Calcium Carb-Cholecalciferol (CALCIUM 600/VITAMIN D3) 600-800 MG-UNIT TABS Take 1 tablet by mouth 2 (two) times daily.    . carvedilol (COREG) 6.25 MG tablet TAKE 1 TABLET TWICE DAILY 180 tablet 1  . furosemide (LASIX) 20 MG tablet 1 TAB IN AM AND 2 TABS PM 270 tablet 1  . gabapentin (NEURONTIN) 300 MG capsule Take 300 mg by mouth 4 (four) times daily.     Marland Kitchen losartan (COZAAR) 100 MG tablet TAKE 1 TABLET EVERY DAY 90 tablet 1  . metolazone (ZAROXOLYN) 2.5 MG tablet TAKE 1 TABLET EVERY OTHER DAY 45 tablet 2  . Multiple Vitamin (MULTIVITAMIN) capsule Take 1 capsule by mouth daily.      . polyvinyl alcohol-povidone (REFRESH) 1.4-0.6 % ophthalmic solution 1-2 drops as needed.    . potassium chloride (K-DUR) 10 MEQ tablet Take 2 tablets (20 mEq total) by mouth 2 (two) times daily. 360 tablet 1  . potassium chloride  (K-DUR,KLOR-CON) 10 MEQ tablet Take 2 tablets (20 mEq total) by mouth 2 (two) times daily. 360 tablet 0  . pravastatin (PRAVACHOL) 40 MG tablet Take 1 tablet (40 mg total) by mouth daily. 90 tablet 3  . traMADol (ULTRAM) 50 MG tablet 1 tablet up to 4 times per day 360 tablet 0  . verapamil (VERELAN PM) 240 MG 24 hr capsule Take 1 capsule (240 mg total) by mouth at bedtime. GENERIC ONLY 90 capsule 1  . vitamin B-12 (CYANOCOBALAMIN) 1000 MCG tablet Take 1,000 mcg by mouth daily.      Marland Kitchen warfarin (COUMADIN) 5 MG tablet TAKE 1 AND 1/2 TABLETS EVERY DAY  OR AS DIRECTED  BY  COUMADIN  CLINIC 145 tablet 1   No current facility-administered medications for this visit.    Functional Status:  In your present state of health, do you have any difficulty performing the following activities: 07/06/2015 06/08/2015  Hearing? - N  Vision? - N  Difficulty concentrating or making decisions? N N  Walking or climbing stairs? Y Y  Dressing or bathing? N N  Doing errands, shopping? N N  Preparing Food and eating ? N N  Using the Toilet? N N  In the past six months, have you accidently leaked urine? (No Data) Y  Do you have problems with loss of bowel control? N N  Managing your Medications? N N  Managing your Finances? N N  Housekeeping or managing your Housekeeping? N N    Fall/Depression Screening: PHQ 2/9 Scores 05/10/2015 04/07/2015 06/18/2014 06/11/2013  PHQ - 2 Score 0 0 0 0    Assessment: Patient continues to live alone and is independent for ADLs.  No episodic issues with heart failure in the past month.  THN CM Care Plan Problem One        Most Recent Value   Care Plan Problem One  knowledge deficit related to congestive heart failiure diagnosis   Role Documenting the Problem One  Health Coach [Patient has recieved CHF educational materials.]   Care Plan for Problem One  Active   THN Long Term Goal (31-90 days)  Patient will verbalize all 3 congestive heart failure zones within 60 days   THN Long  Term Goal Start Date  11/24/14   [facility] Long Term Goal Met Date  04/07/15   Interventions for Problem One Long Term Goal  Using teachback method, patient able to explain action plan [Reinforced teaching re CHF Action Plan.]   THN CM Short Term Goal #1 (0-30 days)  patient will report 3 low sodium food items within 30 days   THN CM Short Term Goal #1 Start Date  03/11/15   [facility] CM Short Term Goal #1 Met Date  07/06/15   Interventions for Short Term Goal #1  Reinforced guidelines for low sodium diet.   THN CM Short Term Goal #2 (0-30 days)  patient will report taking medications as prescribed by doctor within 30 days.   THN CM Short Term Goal #2 Met Date  12/07/14   THN CM Short Term Goal #3 (0-30 days)  patient will verbalize heart failure action plan within 30 days    [facility] CM Care Plan Problem Two        Most Recent Value   Care Plan Problem Two  high risk for falls related to recent fall   Role Documenting the Problem Two  [place] for Problem Two  Not Active   THN CM Short Term Goal #1 (0-30 days)  Patient will report no falls within the next 30 days   THN CM Short Term Goal #1 Met Date   03/11/15   Interventions for Short Term Goal #2   Reinforced fall precautions. [No falls reported- reinforced ongoing need for safety.]    [facility] CM Care Plan Problem Three        Most Recent Value   Care Plan Problem Three  Constipation related to pain medication use.   Role Documenting the Problem [place]  Health Coach      Plan: Reinforced education on congestive heart failure self management, low salt diet, fall prevention.           Follow-up assessment scheduled for 08-04-15.  Candie Mile, RN, MSN [place] [phone] Fax [phone]

## 2015-07-15 ENCOUNTER — Other Ambulatory Visit: Payer: Self-pay

## 2015-07-15 MED ORDER — VERAPAMIL HCL ER 240 MG PO CP24: 240.0000 mg | ORAL_CAPSULE | Freq: Every day | ORAL | Status: DC

## 2015-07-18 ENCOUNTER — Ambulatory Visit (INDEPENDENT_AMBULATORY_CARE_PROVIDER_SITE_OTHER): Payer: Commercial Managed Care - HMO | Admitting: Family Medicine

## 2015-07-18 VITALS — BP 107/67 | HR 78 | Resp 16

## 2015-07-18 DIAGNOSIS — I4891 Unspecified atrial fibrillation: Secondary | ICD-10-CM | POA: Diagnosis not present

## 2015-07-18 LAB — POCT INR: INR: 3.5

## 2015-07-19 NOTE — Progress Notes (Signed)
Pt notified of dosage change and follow up appointment has been made. No further questions.

## 2015-07-25 ENCOUNTER — Ambulatory Visit: Payer: Medicare HMO | Admitting: Family Medicine

## 2015-08-01 ENCOUNTER — Encounter: Payer: Self-pay | Admitting: *Deleted

## 2015-08-01 ENCOUNTER — Ambulatory Visit (INDEPENDENT_AMBULATORY_CARE_PROVIDER_SITE_OTHER): Payer: Commercial Managed Care - HMO | Admitting: *Deleted

## 2015-08-01 VITALS — BP 105/51 | HR 64 | Wt 269.0 lb

## 2015-08-01 DIAGNOSIS — I4891 Unspecified atrial fibrillation: Secondary | ICD-10-CM | POA: Diagnosis not present

## 2015-08-01 DIAGNOSIS — Z7901 Long term (current) use of anticoagulants: Secondary | ICD-10-CM

## 2015-08-01 LAB — POCT INR: INR: 3.3

## 2015-08-01 NOTE — Progress Notes (Signed)
Pt here for INR check she missed last night's dose. No other missed doses, diet changes,bruising,bleeding,CP,SOB. She currently has been taking 7.5 mg daily.Charlotte [name] [place]

## 2015-08-03 ENCOUNTER — Other Ambulatory Visit: Payer: Self-pay

## 2015-08-03 DIAGNOSIS — I509 Heart failure, unspecified: Secondary | ICD-10-CM

## 2015-08-03 NOTE — Patient Outreach (Signed)
[facility]) Care Management  [place]  08/03/2015   Charlotte [name] May 27, 1936 [id number]   Patient being followed for disease management of CHF.  She demonstrates understanding and use of CHF Action Plan, and reports no current acute symptoms.  Her weight ranges from 265.4 to 268, but she reports no sudden increases.  She has persistent pain and swelling in her ankles, but attributes this to arthritis.  She has an appointment with her orthopedist this week for follow up on this. She demonstrates understanding of low salt diet guidelines.  She reports difficulty in choosing low salt menu items when eating out. Nocturia remains an issue, but she practices fall prevention strategies and has not fallen recently. She has received a flu shot, and was able to explain good hand hygiene and avoiding crowds as steps to limit her exposure.   Current Medications:  Current Outpatient Prescriptions  Medication Sig Dispense Refill  . acetaminophen (TYLENOL) 650 MG CR tablet Take 2 tablets (1,300 mg total) by mouth every 8 (eight) hours as needed for pain. 90 tablet 3  . Calcium Carb-Cholecalciferol (CALCIUM 600/VITAMIN D3) 600-800 MG-UNIT TABS Take 1 tablet by mouth 2 (two) times daily.    . carvedilol (COREG) 6.25 MG tablet TAKE 1 TABLET TWICE DAILY 180 tablet 1  . furosemide (LASIX) 20 MG tablet 1 TAB IN AM AND 2 TABS PM 270 tablet 1  . gabapentin (NEURONTIN) 300 MG capsule Take 300 mg by mouth 4 (four) times daily.     Marland Kitchen losartan (COZAAR) 100 MG tablet TAKE 1 TABLET EVERY DAY 90 tablet 1  . metolazone (ZAROXOLYN) 2.5 MG tablet TAKE 1 TABLET EVERY OTHER DAY 45 tablet 2  . Multiple Vitamin (MULTIVITAMIN) capsule Take 1 capsule by mouth daily.      . polyvinyl alcohol-povidone (REFRESH) 1.4-0.6 % ophthalmic solution 1-2 drops as needed.    . potassium chloride (K-DUR) 10 MEQ tablet Take 2 tablets (20 mEq total) by mouth 2 (two) times daily. 360 tablet 1  . potassium chloride  (K-DUR,KLOR-CON) 10 MEQ tablet Take 2 tablets (20 mEq total) by mouth 2 (two) times daily. 360 tablet 0  . pravastatin (PRAVACHOL) 40 MG tablet Take 1 tablet (40 mg total) by mouth daily. 90 tablet 3  . traMADol (ULTRAM) 50 MG tablet 1 tablet up to 4 times per day 360 tablet 0  . verapamil (VERELAN PM) 240 MG 24 hr capsule Take 1 capsule (240 mg total) by mouth at bedtime. GENERIC ONLY 90 capsule 1  . vitamin B-12 (CYANOCOBALAMIN) 1000 MCG tablet Take 1,000 mcg by mouth daily.      Marland Kitchen warfarin (COUMADIN) 5 MG tablet TAKE 1 AND 1/2 TABLETS EVERY DAY  OR AS DIRECTED  BY  COUMADIN  CLINIC 145 tablet 1   No current facility-administered medications for this visit.    Functional Status:  In your present state of health, do you have any difficulty performing the following activities: 08/03/2015 07/06/2015  Hearing? N -  Vision? N -  Difficulty concentrating or making decisions? N N  Walking or climbing stairs? Y Y  Dressing or bathing? N N  Doing errands, shopping? N N  Preparing Food and eating ? - N  Using the Toilet? - N  In the past six months, have you accidently leaked urine? - (No Data)  Do you have problems with loss of bowel control? - N  Managing your Medications? - N  Managing your Finances? - N  Housekeeping or managing  your Housekeeping? - N    Fall/Depression Screening: PHQ 2/9 Scores 05/10/2015 04/07/2015 06/18/2014 06/11/2013  PHQ - 2 Score 0 0 0 0   THN CM Care Plan Problem One        Most Recent Value   Care Plan Problem One  knowledge deficit related to congestive heart failiure diagnosis   Role Documenting the Problem One  Health Coach [Patient has recieved CHF educational materials.]   Care Plan for Problem One  Active   THN Long Term Goal (31-90 days)  Patient will verbalize all 3 congestive heart failure zones within 60 days   THN Long Term Goal Start Date  11/24/14   [facility] Long Term Goal Met Date  04/07/15   Interventions for Problem One Long Term Goal  Using teachback  method, patient able to explain action plan [Reinforced teaching re CHF Action Plan.]   THN CM Short Term Goal #1 (0-30 days)  patient will report 3 low sodium food items within 30 days   THN CM Short Term Goal #1 Start Date  03/11/15   [facility] CM Short Term Goal #1 Met Date  07/06/15   Interventions for Short Term Goal #1  Reinforced guidelines for low sodium diet.   THN CM Short Term Goal #2 (0-30 days)  patient will report taking medications as prescribed by doctor within 30 days.   THN CM Short Term Goal #2 Met Date  12/07/14   THN CM Short Term Goal #3 (0-30 days)  patient will verbalize heart failure action plan within 30 days    [facility] CM Care Plan Problem Two        Most Recent Value   Care Plan Problem Two  high risk for falls related to recent fall   Role Documenting the Problem Two  [place] for Problem Two  Not Active   THN CM Short Term Goal #1 (0-30 days)  Patient will report no falls within the next 30 days   THN CM Short Term Goal #1 Met Date   03/11/15   Interventions for Short Term Goal #2   No recent falls.  Reinforced teaching. [No falls reported- reinforced ongoing need for safety.]    [facility] CM Care Plan Problem Three        Most Recent Value   Care Plan Problem Three  Constipation related to pain medication use.   Role Documenting the Problem [place] for Problem Three  Not Active      Assessment: Patient has good understanding of CHF Action Plan.  Plan: Patient will continue to incorporate self-management strategies for CHF.           Patient will keep her appointment on Friday 08-05-15.           RN Health Coach will follow-up in approximately one month.  Candie Mile, RN, MSN [place] [phone] Fax [phone]

## 2015-08-04 ENCOUNTER — Ambulatory Visit: Payer: Commercial Managed Care - HMO

## 2015-08-05 ENCOUNTER — Encounter: Payer: Self-pay | Admitting: Sports Medicine

## 2015-08-05 ENCOUNTER — Ambulatory Visit (INDEPENDENT_AMBULATORY_CARE_PROVIDER_SITE_OTHER): Payer: Commercial Managed Care - HMO | Admitting: Sports Medicine

## 2015-08-05 DIAGNOSIS — M19071 Primary osteoarthritis, right ankle and foot: Secondary | ICD-10-CM | POA: Diagnosis not present

## 2015-08-05 DIAGNOSIS — M19072 Primary osteoarthritis, left ankle and foot: Secondary | ICD-10-CM

## 2015-08-05 MED ORDER — TRAMADOL HCL ER 300 MG PO TB24: 300.0000 mg | ORAL_TABLET | Freq: Every day | ORAL | Status: DC

## 2015-08-05 NOTE — Assessment & Plan Note (Signed)
Previous injection provided 5 months of relief, repeat bilateral ankle injection today. Continue custom orthotics, declines surgical referral. Switching tramadol to 300 mg extended release, Currently using 200 mg of extended-release total.

## 2015-08-05 NOTE — Progress Notes (Signed)
  Subjective:    CC:  Bilateral ankle pain  HPI: This is a pleasant 79 year old female with known bilateral ankle osteoarthritis, and injection was moderately effective approximately 5 months ago, and she desires repeat interventional treatment today.  Past medical history, Surgical history, Family history not pertinant except as noted below, Social history, Allergies, and medications have been entered into the medical record, reviewed, and no changes needed.   Review of Systems: No fevers, chills, night sweats, weight loss, chest pain, or shortness of breath.   Objective:    General: Well Developed, well nourished, and in no acute distress.  Neuro: Alert and oriented x3, extra-ocular muscles intact, sensation grossly intact.  HEENT: Normocephalic, atraumatic, pupils equal round reactive to light, neck supple, no masses, no lymphadenopathy, thyroid nonpalpable.  Skin: Warm and dry, no rashes. Cardiac: Regular rate and rhythm, no murmurs rubs or gallops, no lower extremity edema.  Respiratory: Clear to auscultation bilaterally. Not using accessory muscles, speaking in full sentences.  Procedure: Real-time Ultrasound Guided Injection of Right ankle Device: GE Logiq E  Verbal informed consent obtained.  Time-out conducted.  Noted no overlying erythema, induration, or other signs of local infection.  Skin prepped in a sterile fashion.  Local anesthesia: Topical Ethyl chloride.  With sterile technique and under real time ultrasound guidance:  2 mL Marcaine, 2 mL lidocaine, 1 mL kenalog 40 injected easily. Completed without difficulty  Pain immediately resolved suggesting accurate placement of the medication.  Advised to call if fevers/chills, erythema, induration, drainage, or persistent bleeding.  Images permanently stored and available for review in the ultrasound unit.  Impression: Technically successful ultrasound guided injection.  Procedure: Real-time Ultrasound Guided Injection of  left ankle Device: GE Logiq E  Verbal informed consent obtained.  Time-out conducted.  Noted no overlying erythema, induration, or other signs of local infection.  Skin prepped in a sterile fashion.  Local anesthesia: Topical Ethyl chloride.  With sterile technique and under real time ultrasound guidance:  2 mL Marcaine, 2 mL lidocaine, 1 mL kenalog 40 injected easily. Completed without difficulty  Pain immediately resolved suggesting accurate placement of the medication.  Advised to call if fevers/chills, erythema, induration, drainage, or persistent bleeding.  Images permanently stored and available for review in the ultrasound unit.  Impression: Technically successful ultrasound guided injection.  Left and right ankles were strapped with compressive dressing.  Impression and Recommendations:

## 2015-08-18 ENCOUNTER — Encounter: Payer: Self-pay | Admitting: *Deleted

## 2015-08-19 ENCOUNTER — Ambulatory Visit (INDEPENDENT_AMBULATORY_CARE_PROVIDER_SITE_OTHER): Payer: Commercial Managed Care - HMO | Admitting: Family Medicine

## 2015-08-19 VITALS — BP 149/83 | HR 93

## 2015-08-19 DIAGNOSIS — I4891 Unspecified atrial fibrillation: Secondary | ICD-10-CM

## 2015-08-19 LAB — POCT INR: INR: 1.9

## 2015-08-19 NOTE — Progress Notes (Signed)
Left message advising of recommendations.  

## 2015-08-22 ENCOUNTER — Encounter: Payer: Self-pay | Admitting: Cardiovascular Disease

## 2015-08-22 ENCOUNTER — Ambulatory Visit (INDEPENDENT_AMBULATORY_CARE_PROVIDER_SITE_OTHER): Payer: Commercial Managed Care - HMO | Admitting: Cardiovascular Disease

## 2015-08-22 VITALS — BP 106/60 | HR 67 | Ht 63.0 in | Wt 270.4 lb

## 2015-08-22 DIAGNOSIS — I10 Essential (primary) hypertension: Secondary | ICD-10-CM | POA: Diagnosis not present

## 2015-08-22 DIAGNOSIS — I429 Cardiomyopathy, unspecified: Secondary | ICD-10-CM | POA: Diagnosis not present

## 2015-08-22 NOTE — Patient Instructions (Addendum)
Medication Instructions:  Your physician recommends that you continue on your current medications as directed. Please refer to the Current Medication list given to you today.   Labwork: NONE  Testing/Procedures: Your physician has requested that you have an echocardiogram. Echocardiography is a painless test that uses sound waves to create images of your heart. It provides your doctor with information about the size and shape of your heart and how well your heart's chambers and valves are working. This procedure takes approximately one hour. There are no restrictions for this procedure.  DUE I N  6 MO   Follow-Up: Your physician wants you to follow-up in:   6 MONTHS WITH  DR Eden Emms ECHO SAME DAY You will receive a reminder letter in the mail two months in advance. If you don't receive a letter, please call our office to schedule the follow-up appointment.  Any Other Special Instructions Will Be Listed Below (If Applicable).     If you need a refill on your cardiac medications before your next appointment, please call your pharmacy. CHECK PRICES  ELIQUIS    PRADAXA   Carlena Hurl

## 2015-08-22 NOTE — Progress Notes (Signed)
Patient ID: Charlotte [name], female   DOB: 07/15/1936, 79 y.o.   MRN: [id number]   She has a history of PAF , LBBB, HL, HTN. Dobutamine echocardiogram done in 06/2010 for syncope and demonstrated nonsustained ventricular tachycardia. EF at that time was normal. LHC 06/2010: EF 55%, mild plaque in the LAD 20-30%, otherwise normal coronary arteries. Atrial fibrillation noted 2014 Echocardiogram 02/2013: Moderate LVH, EF 25-30%, anteroseptal and apical HK, moderate AI, MAC, moderate MR, moderate LAE, mild RAE, PASP 32. Patient was placed on Xarelto for anticoagulation. She underwent DCCV 04/01/13 after several weeks of uninterrupted treatment with Xarelto. She is doing well. Notes less dyspnea. She reports NYHA Class IIb symptoms. No orthopnea, PND. LE edema is improved. No CP, syncope or palpitations.   Changed to coumadin recently due to cost   Echo 06/02/14  Only mild AR reviewed   Study Conclusions  - Left ventricle: The cavity size was normal. Wall thickness was increased in a pattern of severe LVH. Systolic function was normal. The estimated ejection fraction was in the range of 55% to 60%. Doppler parameters are consistent with abnormal left ventricular relaxation (grade 1 diastolic dysfunction). - Aortic valve: There was mild regurgitation. - Left atrium: The atrium was mildly dilated. - Atrial septum: No defect or patent foramen ovale was identified.     ROS: Denies fever, malais, weight loss, blurry vision, decreased visual acuity, cough, sputum, SOB, hemoptysis, pleuritic pain, palpitaitons, heartburn, abdominal pain, melena, lower extremity edema, claudication, or rash.  All other systems reviewed and negative  General: Affect appropriate Overweight whtie female  HEENT: normal Neck supple with no adenopathy JVP normal no bruits no thyromegaly Lungs clear with no wheezing and good diaphragmatic motion Heart:  S1/S2 SEM murmur, no rub, gallop or click PMI normal Abdomen: benighn,  BS positve, no tenderness, no AAA no bruit.  No HSM or HJR Distal pulses intact with no bruits Plus one bilateral  Edema with varicose veins Neuro non-focal Skin warm and dry No muscular weakness   Current Outpatient Prescriptions  Medication Sig Dispense Refill  . acetaminophen (TYLENOL) 650 MG CR tablet Take 2 tablets (1,300 mg total) by mouth every 8 (eight) hours as needed for pain. 90 tablet 3  . Calcium Carb-Cholecalciferol (CALCIUM 600/VITAMIN D3) 600-800 MG-UNIT TABS Take 1 tablet by mouth 2 (two) times daily.    . carvedilol (COREG) 6.25 MG tablet Take 6.25 mg by mouth 2 (two) times daily with a meal.    . furosemide (LASIX) 20 MG tablet TAKE 20 MG BY MOUTH IN THE AM & TAKE 40 MG  BY MOUTH IN THE PM    . gabapentin (NEURONTIN) 300 MG capsule Take 300 mg by mouth 4 (four) times daily.     Marland Kitchen losartan (COZAAR) 100 MG tablet Take 100 mg by mouth daily.    . metolazone (ZAROXOLYN) 2.5 MG tablet Take 2.5 mg by mouth daily.    . Multiple Vitamin (MULTIVITAMIN) capsule Take 1 capsule by mouth daily.      . polyvinyl alcohol-povidone (REFRESH) 1.4-0.6 % ophthalmic solution Place 1-2 drops into both eyes daily as needed (FOR DRY EYES).     . potassium chloride (K-DUR,KLOR-CON) 10 MEQ tablet Take 20 mEq by mouth 2 (two) times daily.    . pravastatin (PRAVACHOL) 40 MG tablet Take 1 tablet (40 mg total) by mouth daily. 90 tablet 3  . traMADol (ULTRAM-ER) 300 MG 24 hr tablet Take 1 tablet (300 mg total) by mouth daily. 30 tablet 3  .  verapamil (VERELAN PM) 240 MG 24 hr capsule Take 1 capsule (240 mg total) by mouth at bedtime. GENERIC ONLY 90 capsule 1  . vitamin B-12 (CYANOCOBALAMIN) 1000 MCG tablet Take 1,000 mcg by mouth daily.      Marland Kitchen warfarin (COUMADIN) 5 MG tablet TAKE 1 AND 1/2 TABLETS EVERY DAY  OR AS DIRECTED  BY  COUMADIN  CLINIC 145 tablet 1   No current facility-administered medications for this visit.    Allergies  Amlodipine besylate-valsartan  Electrocardiogram:  SR rate 71   Loss of precordial R waves 8/15   Assessment and Plan PAF: Maint NSR with no palpitations DCM: EF improved 50-55% update echo next year continue current medical Rx HTN: Well controlled.  Continue current medications and low sodium Dash type diet.   Chol:  Lab Results  Component Value Date   LDLCALC 97 02/04/2015    LBBB chronic no high grade AV block yearly ECG Anticoagulation  Fluctuates INR due to obesity she will check price on NOAC with new health plan INR followed by primary [place]

## 2015-08-24 NOTE — Progress Notes (Signed)
Patient aware of recommendations.  

## 2015-08-30 ENCOUNTER — Other Ambulatory Visit: Payer: Self-pay | Admitting: *Deleted

## 2015-08-30 ENCOUNTER — Other Ambulatory Visit: Payer: Self-pay

## 2015-08-30 VITALS — Wt 265.0 lb

## 2015-08-30 DIAGNOSIS — K5909 Other constipation: Secondary | ICD-10-CM

## 2015-08-30 NOTE — Patient Outreach (Signed)
[facility]) Care Management  [facility] Care Manager  08/30/2015   NYELLIE WINDING [birth year] [id number]   Telephonic assessment conducted today.  Patient reports no acute changes in her heart failure status.  Her weight has been steady, around 265 pounds. She continues with limiting salt in her diet.  Patient states she does want to increase her activity level, using a stationary bike.   Patient saw her orthopedist 08-05-15 and received bilateral injections into her ankles, where she has arthritis and chronic pain.  States the shots only helped for a short time, and the pain has returned.  She is using Tramadol 300 mg ER daily, and is starting home exercises for her ankles.  Current Medications:  Current Outpatient Prescriptions  Medication Sig Dispense Refill  . acetaminophen (TYLENOL) 650 MG CR tablet Take 2 tablets (1,300 mg total) by mouth every 8 (eight) hours as needed for pain. 90 tablet 3  . Calcium Carb-Cholecalciferol (CALCIUM 600/VITAMIN D3) 600-800 MG-UNIT TABS Take 1 tablet by mouth 2 (two) times daily.    . carvedilol (COREG) 6.25 MG tablet Take 6.25 mg by mouth 2 (two) times daily with a meal.    . furosemide (LASIX) 20 MG tablet TAKE 20 MG BY MOUTH IN THE AM & TAKE 40 MG  BY MOUTH IN THE PM    . gabapentin (NEURONTIN) 300 MG capsule Take 300 mg by mouth 4 (four) times daily.     Marland Kitchen losartan (COZAAR) 100 MG tablet Take 100 mg by mouth daily.    . metolazone (ZAROXOLYN) 2.5 MG tablet Take 2.5 mg by mouth daily.    . Multiple Vitamin (MULTIVITAMIN) capsule Take 1 capsule by mouth daily.      . polyvinyl alcohol-povidone (REFRESH) 1.4-0.6 % ophthalmic solution Place 1-2 drops into both eyes daily as needed (FOR DRY EYES).     . potassium chloride (K-DUR,KLOR-CON) 10 MEQ tablet Take 20 mEq by mouth 2 (two) times daily.    . pravastatin (PRAVACHOL) 40 MG tablet Take 1 tablet (40 mg total) by mouth daily. 90 tablet 3  . traMADol (ULTRAM-ER) 300 MG 24 hr tablet Take 1  tablet (300 mg total) by mouth daily. 30 tablet 3  . verapamil (VERELAN PM) 240 MG 24 hr capsule Take 1 capsule (240 mg total) by mouth at bedtime. GENERIC ONLY 90 capsule 1  . vitamin B-12 (CYANOCOBALAMIN) 1000 MCG tablet Take 1,000 mcg by mouth daily.      Marland Kitchen warfarin (COUMADIN) 5 MG tablet TAKE 1 AND 1/2 TABLETS EVERY DAY  OR AS DIRECTED  BY  COUMADIN  CLINIC 145 tablet 1   No current facility-administered medications for this visit.    Assessment: Patient demonstrates knowledge and use of CHF Action Plan to self-manage her CHF.  Plan: Patient will increase her activity level by implementing use of a stationary bicycle 5 days a week, gradually                              increasing the amount of time for each session.           Patient will continue use of CHF Action Plan, daily weights, and low salt diet.           RN Health Coach will mail EMMI article on activiy.           RN Health Coach will follow up in approximately 1 month.  [name], RN, MSN RN  [facility] [employer] [phone] Fax [phone]

## 2015-08-31 ENCOUNTER — Other Ambulatory Visit: Payer: Self-pay | Admitting: Cardiovascular Disease

## 2015-09-05 ENCOUNTER — Ambulatory Visit (INDEPENDENT_AMBULATORY_CARE_PROVIDER_SITE_OTHER): Payer: Commercial Managed Care - HMO | Admitting: Sports Medicine

## 2015-09-05 ENCOUNTER — Encounter: Payer: Self-pay | Admitting: Sports Medicine

## 2015-09-05 VITALS — BP 122/79 | HR 102

## 2015-09-05 DIAGNOSIS — M19071 Primary osteoarthritis, right ankle and foot: Secondary | ICD-10-CM | POA: Diagnosis not present

## 2015-09-05 DIAGNOSIS — I482 Chronic atrial fibrillation, unspecified: Secondary | ICD-10-CM

## 2015-09-05 DIAGNOSIS — M19072 Primary osteoarthritis, left ankle and foot: Secondary | ICD-10-CM

## 2015-09-05 LAB — POCT INR: INR: 2.5

## 2015-09-05 MED ORDER — TRAMADOL HCL ER 300 MG PO TB24: 300.0000 mg | ORAL_TABLET | Freq: Every day | ORAL | Status: DC

## 2015-09-05 NOTE — Progress Notes (Signed)
  Subjective:    CC:  Follow-up  HPI: Ankle osteoarthritis: did well with initial injection, previous injection did not provide the same relief, has not yet started 300 mg tramadol. Pain is moderate, persistent. She does have a history of congestive heart failure, most recent echocardiogram showed a normal ejection fraction with LVH, she also had mild nonobstructive coronary artery disease on catheterization 2 years ago. No debilitating angina.  Past medical history, Surgical history, Family history not pertinant except as noted below, Social history, Allergies, and medications have been entered into the medical record, reviewed, and no changes needed.   Review of Systems: No fevers, chills, night sweats, weight loss, chest pain, or shortness of breath.   Objective:    General: Well Developed, well nourished, and in no acute distress.  Neuro: Alert and oriented x3, extra-ocular muscles intact, sensation grossly intact.  HEENT: Normocephalic, atraumatic, pupils equal round reactive to light, neck supple, no masses, no lymphadenopathy, thyroid nonpalpable.  Skin: Warm and dry, no rashes. Cardiac: Regular rate and rhythm, no murmurs rubs or gallops, no lower extremity edema.  Respiratory: Clear to auscultation bilaterally. Not using accessory muscles, speaking in full sentences.  Impression and Recommendations:

## 2015-09-05 NOTE — Assessment & Plan Note (Signed)
After 2 injections she is continuing to have pain, the first one provided good relief. Continue compression stockings, increasing tramadol 300 mg extended release, she needs 3 months  Sent to her mail-order pharmacy.  return to see me in one month, at that point we will consider referral for ankle arthroplasty versus arthrodesis.  She has nonobstructive CAD on a catheterization 2 years ago, with a normal ejection fraction, and no symptoms of angina , some LVH.

## 2015-09-19 ENCOUNTER — Ambulatory Visit (INDEPENDENT_AMBULATORY_CARE_PROVIDER_SITE_OTHER): Payer: Commercial Managed Care - HMO | Admitting: Family Medicine

## 2015-09-19 ENCOUNTER — Telehealth: Payer: Self-pay | Admitting: Emergency Medicine

## 2015-09-19 VITALS — BP 128/79 | HR 82 | Resp 16

## 2015-09-19 DIAGNOSIS — I4891 Unspecified atrial fibrillation: Secondary | ICD-10-CM | POA: Diagnosis not present

## 2015-09-19 LAB — POCT INR: INR: 3.7

## 2015-09-20 NOTE — Telephone Encounter (Signed)
n

## 2015-09-26 ENCOUNTER — Ambulatory Visit: Payer: Self-pay

## 2015-09-27 ENCOUNTER — Other Ambulatory Visit: Payer: Self-pay

## 2015-09-27 VITALS — Wt 264.0 lb

## 2015-09-27 DIAGNOSIS — I509 Heart failure, unspecified: Secondary | ICD-10-CM

## 2015-09-27 NOTE — Patient Outreach (Signed)
[name]) Care Management  [place]  09/27/2015   Charlotte [name] August 24, 1936 [id number]  Telephonic assessment completed with patient.  She reports continuing chronic ankle pain, and that she has had a cold for a few weeks (no fever).  She is managing her pain with Tramadol 50 mg tablets 3-4 times a day, waiting for insurance approval for Tramadol ER 367m.  Patient continues with daily weights and abiding by low salt dietary guidelines.  Weight today was 164 pounds.  Reinforced importance of closely monitoring weight, especially during the holidays with eating out or going to holiday functions.  Patient has not implemented an exercise regimen.  She intends to use her stationary bike up to 5 days a week, but states she has not felt like exercising due to her cold and ankle pain.  Encouraged to implement activity plan.  Patient reports no recent falls.  Current Medications:  Current Outpatient Prescriptions  Medication Sig Dispense Refill  . traMADol (ULTRAM) 50 MG tablet Take 50 mg by mouth every 6 (six) hours as needed (Staying on 544mtablet until 30059mR is approved.).    . aMarland Kitchenetaminophen (TYLENOL) 650 MG CR tablet Take 2 tablets (1,300 mg total) by mouth every 8 (eight) hours as needed for pain. 90 tablet 3  . Calcium Carb-Cholecalciferol (CALCIUM 600/VITAMIN D3) 600-800 MG-UNIT TABS Take 1 tablet by mouth 2 (two) times daily.    . carvedilol (COREG) 6.25 MG tablet Take 6.25 mg by mouth 2 (two) times daily with a meal.    . furosemide (LASIX) 20 MG tablet TAKE 20 MG BY MOUTH IN THE AM & TAKE 40 MG  BY MOUTH IN THE PM    . gabapentin (NEURONTIN) 300 MG capsule Take 300 mg by mouth 4 (four) times daily.     . lMarland Kitchensartan (COZAAR) 100 MG tablet Take 100 mg by mouth daily.    . metolazone (ZAROXOLYN) 2.5 MG tablet Take 2.5 mg by mouth daily.    . Multiple Vitamin (MULTIVITAMIN) capsule Take 1 capsule by mouth daily.      . polyvinyl alcohol-povidone (REFRESH)  1.4-0.6 % ophthalmic solution Place 1-2 drops into both eyes daily as needed (FOR DRY EYES).     . potassium chloride (K-DUR,KLOR-CON) 10 MEQ tablet TAKE 2 TABLETS TWICE DAILY (PLEASE CALL MD OFFICE AND SCHEDULE AN APPOINTMENT) 360 tablet 3  . pravastatin (PRAVACHOL) 40 MG tablet Take 1 tablet (40 mg total) by mouth daily. 90 tablet 3  . traMADol (ULTRAM-ER) 300 MG 24 hr tablet Take 1 tablet (300 mg total) by mouth daily. (Patient not taking: Reported on 09/27/2015) 90 tablet 3  . verapamil (VERELAN PM) 240 MG 24 hr capsule Take 1 capsule (240 mg total) by mouth at bedtime. GENERIC ONLY 90 capsule 1  . vitamin B-12 (CYANOCOBALAMIN) 1000 MCG tablet Take 1,000 mcg by mouth daily.      . wMarland Kitchenrfarin (COUMADIN) 5 MG tablet TAKE 1 AND 1/2 TABLETS EVERY DAY  OR AS DIRECTED  BY  COUMADIN  CLINIC 145 tablet 1   No current facility-administered medications for this visit.    [facility] CM Care Plan Problem One        Most Recent Value   Care Plan Problem One  knowledge deficit related to congestive heart failiure diagnosis   Role Documenting the Problem One  Health Coach [Patient has recieved CHF educational materials.]   Care Plan for Problem One  Active   [facility] Long Term Goal (31-90 days)  Patient  will verbalize all 3 congestive heart failure zones within 60 days   [facility] Long Term Goal Start Date  11/24/14   [facility] Long Term Goal Met Date  04/07/15   Interventions for Problem One Long Term Goal  Reviewed Action Plan with patient. [Reinforced teaching re CHF Action Plan.]   [facility] CM Short Term Goal #1 (0-30 days)  patient will report 3 low sodium food items within 30 days   [facility] CM Short Term Goal #1 Start Date  03/11/15   [facility] CM Short Term Goal #1 Met Date  07/06/15   Interventions for Short Term Goal #1  Reinforced guidelines for low sodium diet.   THN CM Short Term Goal #2 (0-30 days)  Taking meds as prescribed.   [facility]N CM Short Term Goal #2 Met Date  12/07/14   [facility] CM Short Term Goal #3 (0-30 days)  patient will  verbalize heart failure action plan within 30 days   [facility]N CM Short Term Goal #4 (0-30 days)  Patient will increase activity level by using stationalry bike 5 times a week within 30 days.   [facility] CM Short Term Goal #4 Start Date  08/30/15   Interventions for Short Term Goal #4  Has not been using exercise bike.  Reinforced need to increase activity levle.    [facility]N CM Care Plan Problem Two        Most Recent Value   Care Plan Problem Two  high risk for falls related to recent fall   Role Documenting the Problem [place] for Problem Two  Not Active   [facility]N CM Short Term Goal #1 (0-30 days)  Patient will report no falls within the next 30 days   [facility] CM Short Term Goal #1 Met Date   03/11/15   Interventions for Short Term Goal #2   No recent falls.  Reinforced teaching. [No falls reported- reinforced ongoing need for safety.]    [facility] CM Care Plan Problem Three        Most Recent Value   Care Plan Problem Three  Constipation related to pain medication use.   Role Documenting the Problem [place] for Problem Three  Not Active       Assessment: Patient managing her CHF, using her CHF Action Plan.  Plan: Patient will start using her exercise bike up to 5 days a week.           Patient will continue low salt dietary guidelines and daily weights.           RN Health Coach will follow up within one month.  Candie Mile, RN, MSN [place] [phone] Fax [phone]

## 2015-10-04 ENCOUNTER — Ambulatory Visit (INDEPENDENT_AMBULATORY_CARE_PROVIDER_SITE_OTHER): Payer: Commercial Managed Care - HMO | Admitting: Family Medicine

## 2015-10-04 ENCOUNTER — Ambulatory Visit: Payer: Commercial Managed Care - HMO | Admitting: Sports Medicine

## 2015-10-04 VITALS — BP 119/68 | HR 70

## 2015-10-04 DIAGNOSIS — I4891 Unspecified atrial fibrillation: Secondary | ICD-10-CM

## 2015-10-04 LAB — POCT INR: INR: 3.4

## 2015-10-04 NOTE — Progress Notes (Signed)
Pt notified. -Olivia Mackie, RMA

## 2015-10-04 NOTE — Progress Notes (Signed)
Pt presents to clinic for coumadin check.  Pt tolerated finger stick without any complications. -Olivia Mackie, RMA

## 2015-10-07 ENCOUNTER — Other Ambulatory Visit: Payer: Self-pay | Admitting: Family Medicine

## 2015-10-18 ENCOUNTER — Ambulatory Visit (INDEPENDENT_AMBULATORY_CARE_PROVIDER_SITE_OTHER): Payer: Commercial Managed Care - HMO | Admitting: Family Medicine

## 2015-10-18 DIAGNOSIS — I4891 Unspecified atrial fibrillation: Secondary | ICD-10-CM

## 2015-10-18 LAB — POCT INR: INR: 1.7

## 2015-10-20 NOTE — Progress Notes (Signed)
Patient advised of recommendations.  

## 2015-10-21 ENCOUNTER — Encounter: Payer: Self-pay | Admitting: Family Medicine

## 2015-10-21 ENCOUNTER — Ambulatory Visit (INDEPENDENT_AMBULATORY_CARE_PROVIDER_SITE_OTHER): Payer: Commercial Managed Care - HMO | Admitting: Family Medicine

## 2015-10-21 VITALS — BP 134/65 | HR 76 | Wt 265.0 lb

## 2015-10-21 DIAGNOSIS — E1169 Type 2 diabetes mellitus with other specified complication: Secondary | ICD-10-CM

## 2015-10-21 DIAGNOSIS — I4891 Unspecified atrial fibrillation: Secondary | ICD-10-CM

## 2015-10-21 DIAGNOSIS — R7301 Impaired fasting glucose: Secondary | ICD-10-CM | POA: Diagnosis not present

## 2015-10-21 DIAGNOSIS — E119 Type 2 diabetes mellitus without complications: Secondary | ICD-10-CM | POA: Diagnosis not present

## 2015-10-21 DIAGNOSIS — I10 Essential (primary) hypertension: Secondary | ICD-10-CM | POA: Diagnosis not present

## 2015-10-21 DIAGNOSIS — E669 Obesity, unspecified: Secondary | ICD-10-CM

## 2015-10-21 LAB — POCT GLYCOSYLATED HEMOGLOBIN (HGB A1C): HEMOGLOBIN A1C: 6.5

## 2015-10-21 MED ORDER — METFORMIN HCL 500 MG PO TABS: 500.0000 mg | ORAL_TABLET | Freq: Two times a day (BID) | ORAL | Status: DC

## 2015-10-21 MED ORDER — TRUE METRIX GO GLUCOSE METER W/DEVICE KIT: 1.0000 | PACK | Freq: Every day | Status: DC

## 2015-10-21 MED ORDER — TGT LANCET ULTRA THIN 30G MISC: Status: DC

## 2015-10-21 MED ORDER — GLUCOSE BLOOD VI STRP: ORAL_STRIP | Status: DC

## 2015-10-21 MED ORDER — ALCOHOL PREP 70 % PADS: MEDICATED_PAD | Status: DC

## 2015-10-21 NOTE — Progress Notes (Signed)
Subjective:    Patient ID: Charlotte [name], female    DOB: [birth year], 80 y.o.   MRN: [id number]  HPI Hypertension- Pt denies chest pain, SOB, dizziness, or heart palpitations.  Taking meds as directed w/o problems.  Denies medication side effects.    IFG - no increased thirst or urination. She has lost 5 pounds since I saw her in September. She is active but does not exercise actively.  Atrial fibrillation no chest pain shortness of breath. No swelling. She does come in regularly for her Coumadin checks.  Review of Systems   BP 134/65 mmHg  Pulse 76  Wt 265 lb (120.203 kg)  SpO2 95%    Allergies  Allergen Reactions  . Amlodipine Besylate-Valsartan     HEADACHE     Past Medical History  Diagnosis Date  . HYPERLIPIDEMIA, MILD   . CATARACTS   . HYPERTENSION, BENIGN SYSTEMIC   . RHINITIS, ALLERGIC   . GERD (gastroesophageal reflux disease)   . Irritable bowel syndrome   . OSTEOARTHRITIS   . MEDIAL EPICONDYLITIS   . OSTEOPENIA   . SYNCOPE   . VITAMIN D DEFICIENCY   . Complication of anesthesia 2009    nausea and vomitting  . Chronic systolic heart failure (HCC)     in setting of AFib;  Echocardiogram 02/2013: Moderate LVH, EF 25-30%, anteroseptal and apical HK, moderate AI, MAC, moderate MR, moderate LAE, mild RAE, PASP 32 => after DCCV f/u Echo 8/14: Moderate LVH, EF 60-65%, normal wall motion, grade 1 diastolic dysfunction, moderate AI, moderate LAE  . CAD (coronary artery disease)     LHC 06/2010: EF 55%, mild plaque in the LAD 20-30%, otherwise normal coronary arteries  . LBBB (left bundle branch block)   . NSVT (nonsustained ventricular tachycardia) (HCC)     during Dob Echo 2011 => normal cath  . Atrial fibrillation (HCC)     a. s/p DCCV 03/2013  . Aortic insufficiency     Past Surgical History  Procedure Laterality Date  . Radical hysterectomy    . Replacement total knee      11/03/07 right....... 03/29/08 left  . Cataract extraction, bilateral  2012    Dr.  Elmer Picker  . Cardioversion N/A 04/01/2013    Procedure: CARDIOVERSION;  Surgeon: Wendall Stade, MD;  Location: [facility] ENDOSCOPY;  Service: Cardiovascular;  Laterality: N/A;    Social History   Social History  . Marital Status: Divorced    Spouse Name: N/A  . Number of Children: 1  . Years of Education: N/A   Occupational History  . Retired    Social History Main Topics  . Smoking status: Former Smoker    Quit date: 10/08/1970  . Smokeless tobacco: Not on file  . Alcohol Use: No  . Drug Use: No  . Sexual Activity: Not on file   Other Topics Concern  . Not on file   Social History Narrative    Family History  Problem Relation Age of Onset  . Arrhythmia Mother     afib  . Alzheimer's disease Mother   . Colon cancer Father   . Heart attack Father 21  . Diabetes Sister   . Arrhythmia Sister     afib    Outpatient Encounter Prescriptions as of 10/21/2015  Medication Sig  . acetaminophen (TYLENOL) 650 MG CR tablet Take 2 tablets (1,300 mg total) by mouth every 8 (eight) hours as needed for pain.  . Calcium Carb-Cholecalciferol (CALCIUM 600/VITAMIN D3) 600-800 MG-UNIT TABS  Take 1 tablet by mouth 2 (two) times daily.  . carvedilol (COREG) 6.25 MG tablet Take 6.25 mg by mouth 2 (two) times daily with a meal.  . furosemide (LASIX) 20 MG tablet TAKE 20 MG BY MOUTH IN THE AM & TAKE 40 MG  BY MOUTH IN THE PM  . gabapentin (NEURONTIN) 300 MG capsule Take 300 mg by mouth 4 (four) times daily.   Marland Kitchen losartan (COZAAR) 100 MG tablet Take 100 mg by mouth daily.  . metolazone (ZAROXOLYN) 2.5 MG tablet Take 2.5 mg by mouth daily.  . Multiple Vitamin (MULTIVITAMIN) capsule Take 1 capsule by mouth daily.    . polyvinyl alcohol-povidone (REFRESH) 1.4-0.6 % ophthalmic solution Place 1-2 drops into both eyes daily as needed (FOR DRY EYES).   . potassium chloride (K-DUR,KLOR-CON) 10 MEQ tablet TAKE 2 TABLETS TWICE DAILY (PLEASE CALL MD OFFICE AND SCHEDULE AN APPOINTMENT)  . pravastatin (PRAVACHOL) 40  MG tablet TAKE 1 TABLET EVERY DAY  . traMADol (ULTRAM) 50 MG tablet TAKE 1 TABLET  UP  TO FOUR TIMES DAILY  . verapamil (VERELAN PM) 240 MG 24 hr capsule Take 1 capsule (240 mg total) by mouth at bedtime. GENERIC ONLY  . vitamin B-12 (CYANOCOBALAMIN) 1000 MCG tablet Take 1,000 mcg by mouth daily.    Marland Kitchen warfarin (COUMADIN) 5 MG tablet TAKE 1 AND 1/2 TABLETS EVERY DAY  OR AS DIRECTED  BY  COUMADIN  CLINIC  . metFORMIN (GLUCOPHAGE) 500 MG tablet Take 1 tablet (500 mg total) by mouth 2 (two) times daily with a meal.  . [DISCONTINUED] traMADol (ULTRAM-ER) 300 MG 24 hr tablet Take 1 tablet (300 mg total) by mouth daily.   No facility-administered encounter medications on file as of 10/21/2015.          Objective:   Physical Exam  Constitutional: She is oriented to person, place, and time. She appears well-developed and well-nourished.  HENT:  Head: Normocephalic and atraumatic.  Cardiovascular: Normal rate, regular rhythm and normal heart sounds.   Pulmonary/Chest: Effort normal and breath sounds normal.  Neurological: She is alert and oriented to person, place, and time.  Skin: Skin is warm and dry.  Psychiatric: She has a normal mood and affect. Her behavior is normal.          Assessment & Plan:  HTN - well controlled.  Continue current regimen.  New Dx DM type 2 - her A1c today is 6.5 which makes her officially diabetic. We discussed starting metformin. Discussed potential side effects of the medication including diarrhea and upset stomach. Will need a BMP. Will get her prescription for glucometer. Follow-up in 3 month.    Atrial fibrillation-stable. Comes in regularly for Coumadin evaluation.

## 2015-10-26 ENCOUNTER — Other Ambulatory Visit: Payer: Self-pay

## 2015-10-26 VITALS — Wt 263.0 lb

## 2015-10-26 DIAGNOSIS — R7301 Impaired fasting glucose: Secondary | ICD-10-CM | POA: Diagnosis not present

## 2015-10-26 DIAGNOSIS — E669 Obesity, unspecified: Secondary | ICD-10-CM | POA: Diagnosis not present

## 2015-10-26 DIAGNOSIS — I509 Heart failure, unspecified: Secondary | ICD-10-CM

## 2015-10-26 DIAGNOSIS — I10 Essential (primary) hypertension: Secondary | ICD-10-CM | POA: Diagnosis not present

## 2015-10-26 DIAGNOSIS — E119 Type 2 diabetes mellitus without complications: Secondary | ICD-10-CM | POA: Diagnosis not present

## 2015-10-26 NOTE — Patient Outreach (Signed)
[facility]) Care Management  [place]run[place]  10/26/2015   Charlotte [name] 01-07-1936 [id number]  Telephone contact with patient, who reports no issues with her CHF.  She continues to monitor her weight daily, and it remains stable with no sudden increases.  Weight today is 263.  She continues to limit sodium intake.  Patient has not yet implemented an activity plan.  She has an exercise bike, but has not been using it.  One reason is ongoing ankle pain, for which she takes Tramadol 50 mg 3 or 4 times a day.  Discussed chronic pain management and encouraged her to discuss concerns about pain levels with her PCP.  Patient reports she saw her PCP on Jan 13th, and was given a new diagnosis of diabetes.  She has a new Rx for Metformin 500 mg twice a day, but has not yet received it from her mail order pharmacy.  She also has an Rx for a glucometer, but has not yet obtained one.  Instruction was provided about monitoring blood sugar levels, starting her Metformin when she gets it, and diabetic dietary guidelines.  Discussed hypo and hyperglycemia symptoms and management.  Will mail EMMI materials on diabetic self management.  Patient reports her daughter is a Marine scientist and that she will teach her how to use the glucometer.  Current Medications:  Current Outpatient Prescriptions  Medication Sig Dispense Refill  . traMADol (ULTRAM) 50 MG tablet TAKE 1 TABLET  UP  TO FOUR TIMES DAILY 360 tablet 0  . acetaminophen (TYLENOL) 650 MG CR tablet Take 2 tablets (1,300 mg total) by mouth every 8 (eight) hours as needed for pain. 90 tablet 3  . Alcohol Swabs (ALCOHOL PREP) 70 % PADS Use to test blood sugars one time daily. DX: E11.9 100 each 3  . Blood Glucose Monitoring Suppl (TRUE METRIX GO GLUCOSE METER) w/Device KIT 1 kit by Does not apply route daily. Use to test blood sugars one time daily. DX: E11.9 1 kit 0  . Calcium Carb-Cholecalciferol (CALCIUM 600/VITAMIN D3) 600-800 MG-UNIT TABS Take 1  tablet by mouth 2 (two) times daily.    . carvedilol (COREG) 6.25 MG tablet Take 6.25 mg by mouth 2 (two) times daily with a meal.    . furosemide (LASIX) 20 MG tablet TAKE 20 MG BY MOUTH IN THE AM & TAKE 40 MG  BY MOUTH IN THE PM    . gabapentin (NEURONTIN) 300 MG capsule Take 300 mg by mouth 4 (four) times daily.     Marland Kitchen glucose blood test strip Use to test blood sugars one time daily. DX: E11.9 100 each 12  . Lancets (TGT LANCET ULTRA THIN 30G) MISC Use to test blood sugars one time daily. DX: E11.9 100 each 3  . losartan (COZAAR) 100 MG tablet Take 100 mg by mouth daily.    . metFORMIN (GLUCOPHAGE) 500 MG tablet Take 1 tablet (500 mg total) by mouth 2 (two) times daily with a meal. (Patient not taking: Reported on 10/26/2015) 180 tablet 1  . metolazone (ZAROXOLYN) 2.5 MG tablet Take 2.5 mg by mouth daily.    . Multiple Vitamin (MULTIVITAMIN) capsule Take 1 capsule by mouth daily.      . polyvinyl alcohol-povidone (REFRESH) 1.4-0.6 % ophthalmic solution Place 1-2 drops into both eyes daily as needed (FOR DRY EYES).     . potassium chloride (K-DUR,KLOR-CON) 10 MEQ tablet TAKE 2 TABLETS TWICE DAILY (PLEASE CALL MD OFFICE AND SCHEDULE AN APPOINTMENT) 360 tablet 3  .  pravastatin (PRAVACHOL) 40 MG tablet TAKE 1 TABLET EVERY DAY 90 tablet 3  . verapamil (VERELAN PM) 240 MG 24 hr capsule Take 1 capsule (240 mg total) by mouth at bedtime. GENERIC ONLY 90 capsule 1  . vitamin B-12 (CYANOCOBALAMIN) 1000 MCG tablet Take 1,000 mcg by mouth daily.      Marland Kitchen warfarin (COUMADIN) 5 MG tablet TAKE 1 AND 1/2 TABLETS EVERY DAY  OR AS DIRECTED  BY  COUMADIN  CLINIC 145 tablet 1   No current facility-administered medications for this visit.      THN CM Care Plan Problem One        Most Recent Value   Care Plan Problem One  knowledge deficit related to congestive heart failiure diagnosis   Role Documenting the Problem One  Health Coach [Patient has recieved CHF educational materials.]   Care Plan for Problem One   Active   THN Long Term Goal (31-90 days)  Patient will verbalize all 3 congestive heart failure zones within 60 days   THN Long Term Goal Start Date  11/24/14   [facility] Long Term Goal Met Date  04/07/15   Interventions for Problem One Long Term Goal  Reviewed Action Plan with patient. [Reinforced teaching re CHF Action Plan.]   THN CM Short Term Goal #1 (0-30 days)  patient will report 3 low sodium food items within 30 days   THN CM Short Term Goal #1 Start Date  03/11/15   [facility] CM Short Term Goal #1 Met Date  07/06/15   Interventions for Short Term Goal #1  Reinforced guidelines for low sodium diet.   THN CM Short Term Goal #2 (0-30 days)  Taking meds as prescribed.   THN CM Short Term Goal #2 Met Date  12/07/14   THN CM Short Term Goal #3 (0-30 days)  patient will verbalize heart failure action plan within 30 days   THN CM Short Term Goal #4 (0-30 days)  Patient will increase activity level by using stationalry bike 5 times a week within 30 days.   THN CM Short Term Goal #4 Start Date  08/30/15   Interventions for Short Term Goal #4  Reinforced need to increase activity level.    THN CM Care Plan Problem Two        Most Recent Value   Care Plan Problem Two  high risk for falls related to recent fall   Role Documenting the Problem [place] for Problem Two  Not Active   THN CM Short Term Goal #1 (0-30 days)  Patient will report no falls within the next 30 days   THN CM Short Term Goal #1 Met Date   03/11/15   Interventions for Short Term Goal #2   Fall assessment completed.  No recent falls. [No falls reported- reinforced ongoing need for safety.]    [facility] CM Care Plan Problem Three        Most Recent Value   Care Plan Problem Three  Constipation related to pain medication use.   Role Documenting the Problem [place] for Problem Three  Not Active   THN CM Short Term Goal #1 (0-30 days)  Patient will monitor cbgs and keep a record.   THN CM Short  Term Goal #1 Start Date  10/26/15   Interventions for Short Term Goal #1  Education provided re monitoring cbgs and keeping record.  Will mail EMMI materials.  THN CM Short Term Goal #2 (0-30 days)  Patient will be able to name 3 foods to avoid for diabetic dietary management.   THN CM Short Term Goal #2 Start Date  10/26/15   Interventions for Short Term Goal #2  Discussed need to limit carbohydrates.  Will mail EMMI materials.       Assessment: Patient appears to be using CHF Action Plan and is effectively managing CHF.                        Patient lacks understanding of diabetic self management.  Plan: Patient will continue daily weights and low salt diet.           Patient will obtain a glucometer and begin monitoring blood sugars and keeping a record.           Patient will use EMMI educational materials to increase her knowledge of diabetic management.           RN Health Coach will mail EMMI materials on diabetic blood sugar monitoring and dietary guidelines.           RN Health Coach will follow up in approximately 2 weeks.  Candie Mile, RN, MSN [place] [phone] Fax [phone]

## 2015-10-27 LAB — BASIC METABOLIC PANEL WITH GFR
BUN: 33 mg/dL — ABNORMAL HIGH (ref 7–25)
CHLORIDE: 97 mmol/L — AB (ref 98–110)
CO2: 31 mmol/L (ref 20–31)
Calcium: 9.6 mg/dL (ref 8.6–10.4)
Creat: 0.99 mg/dL — ABNORMAL HIGH (ref 0.60–0.93)
GFR, EST NON AFRICAN AMERICAN: 55 mL/min — AB (ref >=60)
GFR, Est African American: 63 mL/min (ref >=60)
Glucose, Bld: 101 mg/dL — ABNORMAL HIGH (ref 65–99)
Potassium: 3.6 mmol/L (ref 3.5–5.3)
SODIUM: 137 mmol/L (ref 135–146)

## 2015-10-28 ENCOUNTER — Encounter: Payer: Self-pay | Admitting: Family Medicine

## 2015-11-02 ENCOUNTER — Other Ambulatory Visit: Payer: Self-pay | Admitting: Cardiovascular Disease

## 2015-11-03 ENCOUNTER — Ambulatory Visit (INDEPENDENT_AMBULATORY_CARE_PROVIDER_SITE_OTHER): Payer: Commercial Managed Care - HMO | Admitting: Family Medicine

## 2015-11-03 VITALS — BP 111/70 | HR 66 | Ht 63.0 in | Wt 265.0 lb

## 2015-11-03 DIAGNOSIS — I4891 Unspecified atrial fibrillation: Secondary | ICD-10-CM | POA: Diagnosis not present

## 2015-11-03 LAB — POCT INR: INR: 1.2

## 2015-11-16 ENCOUNTER — Other Ambulatory Visit: Payer: Self-pay

## 2015-11-21 ENCOUNTER — Ambulatory Visit (INDEPENDENT_AMBULATORY_CARE_PROVIDER_SITE_OTHER): Payer: Commercial Managed Care - HMO | Admitting: Family Medicine

## 2015-11-21 DIAGNOSIS — I4891 Unspecified atrial fibrillation: Secondary | ICD-10-CM | POA: Diagnosis not present

## 2015-11-21 LAB — POCT INR: INR: 1.1

## 2015-11-21 NOTE — Progress Notes (Signed)
Pt aware of Rx change. Appt scheduled for 2 week follow up.

## 2015-12-05 ENCOUNTER — Ambulatory Visit (INDEPENDENT_AMBULATORY_CARE_PROVIDER_SITE_OTHER): Payer: Commercial Managed Care - HMO | Admitting: Family Medicine

## 2015-12-05 DIAGNOSIS — I4891 Unspecified atrial fibrillation: Secondary | ICD-10-CM

## 2015-12-05 LAB — POCT INR: INR: 1.7

## 2015-12-07 ENCOUNTER — Other Ambulatory Visit: Payer: Self-pay | Admitting: Cardiovascular Disease

## 2015-12-07 ENCOUNTER — Other Ambulatory Visit: Payer: Self-pay | Admitting: Family Medicine

## 2015-12-14 ENCOUNTER — Ambulatory Visit: Payer: Self-pay

## 2015-12-14 ENCOUNTER — Other Ambulatory Visit: Payer: Self-pay

## 2015-12-14 NOTE — Patient Outreach (Signed)
[facility]) Care Management  12/14/2015  Charlotte [name] [birth year] [id number]   Unsuccessful attempt to call for scheduled appointment.  HIPPA appropriate message left requesting call back.  If no response RN will make another attempt to contact patient within 5 working days.  [name], RN, MSN RN [employer] [phone] Fax [phone]

## 2015-12-14 NOTE — Patient Outreach (Signed)
[facility]) Care Management  12/14/2015  Charlotte [name] 10/18/1935 [id number]   LATE ENTRY for contact on 11-16-15.  Telephone contact with patient.  She continues to report stable status for CHF.  Weight is 264.2 pounds.  She continues monitoring daily weights and watching sodium intake.  Patient's daughter is helping her with management of newly diagnosed Diabetes.  Patient states she thinks PCP said "Prediabetes".  Her A1C is 6.5.  Her last cbg on 11-12-15 was 95 fasting.  Education was provided on hypo and hyperglycemia.    [name], RN, MSN RN [employer] [phone] Fax [phone]

## 2015-12-15 ENCOUNTER — Other Ambulatory Visit: Payer: Self-pay

## 2015-12-15 NOTE — Patient Outreach (Signed)
[facility]) Care Management  12/15/2015  Charlotte [name] 1936-02-05 [id number]   Telephonic assessment completed.  Patient reports compliance with CHF Action Plan.  She has had no sudden increases in weight or edema.   Reviewed symptoms of hypoglycemia with patient.  She received written educational materials sent last month, and states she has reviewed them.  Instructed her to check her cbg if she develops signs of hypoglycemia.  Patient states due to neuropathy in her hands she has difficulty using the lancet pen to stick her finger.  Her daughter does this for her. A recent fasting cbg was 88, and a non-fasting reading was 124.  Encouraged her to take the record with her to PCP appointment in April.  THN CM Care Plan Problem One        Most Recent Value   Care Plan Problem One  knowledge deficit related to congestive heart failiure diagnosis   Role Documenting the Problem One  Health Coach [Patient has recieved CHF educational materials.]   Care Plan for Problem One  Active   THN Long Term Goal (31-90 days)  Patient will verbalize all 3 congestive heart failure zones within 60 days   THN Long Term Goal Start Date  11/24/14   [facility] Long Term Goal Met Date  04/07/15   Interventions for Problem One Long Term Goal  Demonstrates knowledge of CHF Action Plan. [Reinforced teaching re CHF Action Plan.]   THN CM Short Term Goal #1 (0-30 days)  patient will report 3 low sodium food items within 30 days   THN CM Short Term Goal #1 Start Date  03/11/15   [facility] CM Short Term Goal #1 Met Date  07/06/15   THN CM Short Term Goal #2 (0-30 days)  Taking meds as prescribed.   THN CM Short Term Goal #2 Met Date  12/07/14   THN CM Short Term Goal #3 (0-30 days)  patient will verbalize heart failure action plan within 30 days   THN CM Short Term Goal #4 (0-30 days)  Patient will increase activity level by using stationalry bike 5 times a week within 30 days.   THN CM Short Term Goal #4 Start Date   08/30/15   Interventions for Short Term Goal #4  Pt encouraged to explore options like yoga that will not aggrevate orthopedic problems.    THN CM Care Plan Problem Two        Most Recent Value   Care Plan Problem Two  high risk for falls related to recent fall   Role Documenting the Problem [place] for Problem Two  Not Active   THN CM Short Term Goal #1 (0-30 days)  Patient will report no falls within the next 30 days   THN CM Short Term Goal #1 Met Date   03/11/15   Interventions for Short Term Goal #2   -- [No falls reported- reinforced ongoing need for safety.]    [facility] CM Care Plan Problem Three        Most Recent Value   Care Plan Problem Three  Constipation related to pain medication use.   Role Documenting the Problem [place] for Problem Three  Not Active   THN CM Short Term Goal #1 (0-30 days)  Patient will monitor cbgs and keep a record.   THN CM Short Term Goal #1 Start Date  10/26/15   Interventions for Short Term Goal #  1  Dau is assisting with cbg checks several times a week.  Encouraged to take record to MD appt. in April.   THN CM Short Term Goal #2 (0-30 days)  Patient will be able to name 3 foods to avoid for diabetic dietary management.   THN CM Short Term Goal #2 Start Date  10/26/15   Interventions for Short Term Goal #2  Patient reports limiting carbohydrates.      Plan: Patient will keep PCP appointment in April.          Patient will continue using CHF Action Plan to monitor for CHF symptoms.          Patient will observe for symptoms of hypoglycemia and take appropriate actions if it occurs.          RN will mail 2017 [facility] calendar to patient.          RN will follow up in approximately one month.  Candie Mile, RN, MSN [place] [phone] Fax [phone]

## 2015-12-19 ENCOUNTER — Ambulatory Visit (INDEPENDENT_AMBULATORY_CARE_PROVIDER_SITE_OTHER): Payer: Commercial Managed Care - HMO | Admitting: Family Medicine

## 2015-12-19 VITALS — BP 125/75 | HR 72

## 2015-12-19 DIAGNOSIS — I4891 Unspecified atrial fibrillation: Secondary | ICD-10-CM | POA: Diagnosis not present

## 2015-12-19 LAB — POCT INR: INR: 2.2

## 2015-12-19 NOTE — Progress Notes (Signed)
Left information on Pt's VM, callback information provided.

## 2015-12-19 NOTE — Progress Notes (Signed)
See anticoagulation flowsheet for adjustments to Coumadin.    [name], MD

## 2016-01-02 ENCOUNTER — Ambulatory Visit (INDEPENDENT_AMBULATORY_CARE_PROVIDER_SITE_OTHER): Payer: Commercial Managed Care - HMO | Admitting: Family Medicine

## 2016-01-02 DIAGNOSIS — I4891 Unspecified atrial fibrillation: Secondary | ICD-10-CM | POA: Diagnosis not present

## 2016-01-02 LAB — POCT INR: INR: 1.9

## 2016-01-03 NOTE — Progress Notes (Signed)
Patient advised of recommendations.  

## 2016-01-17 ENCOUNTER — Ambulatory Visit (INDEPENDENT_AMBULATORY_CARE_PROVIDER_SITE_OTHER): Payer: Commercial Managed Care - HMO | Admitting: Family Medicine

## 2016-01-17 DIAGNOSIS — I4891 Unspecified atrial fibrillation: Secondary | ICD-10-CM

## 2016-01-17 LAB — POCT INR: INR: 2.5

## 2016-01-17 NOTE — Progress Notes (Signed)
Left information on Pt's VM, callback provided for scheduling.

## 2016-01-19 ENCOUNTER — Ambulatory Visit: Payer: Commercial Managed Care - HMO | Admitting: Family Medicine

## 2016-01-23 ENCOUNTER — Ambulatory Visit (INDEPENDENT_AMBULATORY_CARE_PROVIDER_SITE_OTHER): Payer: Commercial Managed Care - HMO | Admitting: Family Medicine

## 2016-01-23 ENCOUNTER — Encounter: Payer: Self-pay | Admitting: Family Medicine

## 2016-01-23 VITALS — BP 120/63 | HR 62 | Wt 264.0 lb

## 2016-01-23 DIAGNOSIS — I10 Essential (primary) hypertension: Secondary | ICD-10-CM

## 2016-01-23 DIAGNOSIS — E1169 Type 2 diabetes mellitus with other specified complication: Secondary | ICD-10-CM

## 2016-01-23 DIAGNOSIS — E785 Hyperlipidemia, unspecified: Secondary | ICD-10-CM | POA: Diagnosis not present

## 2016-01-23 DIAGNOSIS — E119 Type 2 diabetes mellitus without complications: Secondary | ICD-10-CM | POA: Diagnosis not present

## 2016-01-23 DIAGNOSIS — E669 Obesity, unspecified: Secondary | ICD-10-CM

## 2016-01-23 LAB — POCT GLYCOSYLATED HEMOGLOBIN (HGB A1C): Hemoglobin A1C: 6.1

## 2016-01-23 NOTE — Progress Notes (Signed)
   Subjective:    Patient ID: Charlotte [name], female    DOB: Oct 10, 1935, 80 y.o.   MRN: [id number]  HPI Diabetes - no hypoglycemic events. No wounds or sores that are not healing well. No increased thirst or urination. Checking glucose at home. Taking medications as prescribed without any side effects.  Hypertension- Pt denies chest pain, SOB, dizziness, or heart palpitations.  Taking meds as directed w/o problems.  Denies medication side effects.    Atrial fibrillation - currently on Coumadin we just saw her about a week ago and her INR looks fantastic. She is due for recheck in about 2 weeks. Asymtpomatic. No CP, palpitations or SOB. No recent change or increase in swelling. Has some chronic ankle edema.   -Review of Systems     Objective:   Physical Exam  Constitutional: She is oriented to person, place, and time. She appears well-developed and well-nourished.  HENT:  Head: Normocephalic and atraumatic.  Cardiovascular: Normal rate, regular rhythm and normal heart sounds.   Pulmonary/Chest: Effort normal and breath sounds normal.  Musculoskeletal:  Trace ankle edema bilaterally.   Neurological: She is alert and oriented to person, place, and time.  Skin: Skin is warm and dry.  Psychiatric: She has a normal mood and affect. Her behavior is normal.        Assessment & Plan:  DM- Well controlled. Continue current regimen. Follow up in 3-4 months. Schedule for eye exam next week.    Lab Results  Component Value Date   HGBA1C 6.1 01/23/2016   HTN- Well controlled. Continue current regimen. Follow up in 6 mo.   afib - f/U in 2 weeks for coumadin

## 2016-01-31 ENCOUNTER — Other Ambulatory Visit: Payer: Self-pay | Admitting: Cardiovascular Disease

## 2016-02-01 DIAGNOSIS — E669 Obesity, unspecified: Secondary | ICD-10-CM | POA: Diagnosis not present

## 2016-02-01 DIAGNOSIS — E785 Hyperlipidemia, unspecified: Secondary | ICD-10-CM | POA: Diagnosis not present

## 2016-02-01 DIAGNOSIS — E119 Type 2 diabetes mellitus without complications: Secondary | ICD-10-CM | POA: Diagnosis not present

## 2016-02-01 DIAGNOSIS — I10 Essential (primary) hypertension: Secondary | ICD-10-CM | POA: Diagnosis not present

## 2016-02-02 LAB — BASIC METABOLIC PANEL
BUN: 18 mg/dL (ref 7–25)
CO2: 25 mmol/L (ref 20–31)
Calcium: 9.5 mg/dL (ref 8.6–10.4)
Chloride: 99 mmol/L (ref 98–110)
Creat: 0.72 mg/dL (ref 0.60–0.93)
Glucose, Bld: 113 mg/dL — ABNORMAL HIGH (ref 65–99)
POTASSIUM: 3.7 mmol/L (ref 3.5–5.3)
Sodium: 137 mmol/L (ref 135–146)

## 2016-02-02 LAB — LIPID PANEL
CHOLESTEROL: 151 mg/dL (ref 125–200)
HDL: 57 mg/dL (ref >=46)
LDL Cholesterol: 74 mg/dL (ref <130)
TRIGLYCERIDES: 101 mg/dL (ref <150)
Total CHOL/HDL Ratio: 2.6 Ratio (ref <=5.0)
VLDL: 20 mg/dL (ref <30)

## 2016-02-02 NOTE — Progress Notes (Signed)
Quick Note:  All labs are normal. ______ 

## 2016-02-07 ENCOUNTER — Ambulatory Visit (INDEPENDENT_AMBULATORY_CARE_PROVIDER_SITE_OTHER): Payer: Commercial Managed Care - HMO | Admitting: Family Medicine

## 2016-02-07 ENCOUNTER — Encounter: Payer: Self-pay | Admitting: Family Medicine

## 2016-02-07 ENCOUNTER — Other Ambulatory Visit: Payer: Self-pay

## 2016-02-07 VITALS — BP 132/75 | HR 70 | Wt 262.0 lb

## 2016-02-07 DIAGNOSIS — I4891 Unspecified atrial fibrillation: Secondary | ICD-10-CM

## 2016-02-07 DIAGNOSIS — E119 Type 2 diabetes mellitus without complications: Secondary | ICD-10-CM | POA: Diagnosis not present

## 2016-02-07 DIAGNOSIS — I509 Heart failure, unspecified: Secondary | ICD-10-CM

## 2016-02-07 DIAGNOSIS — Z0000 Encounter for general adult medical examination without abnormal findings: Secondary | ICD-10-CM

## 2016-02-07 DIAGNOSIS — E669 Obesity, unspecified: Secondary | ICD-10-CM

## 2016-02-07 DIAGNOSIS — E1169 Type 2 diabetes mellitus with other specified complication: Secondary | ICD-10-CM

## 2016-02-07 NOTE — Patient Outreach (Signed)
[facility]) Care Management  02/07/2016  Charlotte [name] March 15, 1936 [id number]   Telephone assessment today.  Patient saw her PCP on 01-23-16 and got a good report.  She states Dr. Linford Arnold was pleased with her cbg readings, and that no new problems were found.  She only has to check cbgs about 2 times a week.  Patient states most recent readings were 108 and 116, plus a non-fasting reading of 134.  CHF self-management is on-going.  Patient weighs daily, and limits salt in her diet.  She has an appointment on 02-27-16 with Dr. Hurshel Party, and will have an echo-cardiogram.  Plan: Patient will continue self-management guidelines for CHF and Diabetes.          Patient will keep appointment with cardiologist on 02-27-16.          RN will follow up in approximately one month.  [name], RN, MSN RN [employer] [phone] Fax [phone]

## 2016-02-07 NOTE — Patient Outreach (Signed)
Hgb A1C was 6.1 on 01-23-16.

## 2016-02-07 NOTE — Progress Notes (Signed)
Subjective:    Charlotte [name] is a 80 y.o. female who presents for Medicare Annual/Subsequent preventive examination.  Preventive Screening-Counseling & Management  Tobacco History  Smoking status  . Former Smoker  . Quit date: 10/08/1970  Smokeless tobacco  . Not on file     Problems Prior to Visit 1.   Current Problems (verified) Patient Active Problem List   Diagnosis Date Noted  . Constipation 05/10/2015  . Skin tag 11/25/2014  . Atrial fibrillation, unspecified 11/10/2014  . Severe obesity (BMI >= 40) ([place]) 06/18/2014  . Aortic regurgitation 03/03/2014  . Diabetes mellitus type 2 in obese ([place]) 12/09/2013  . Long term current use of anticoagulant therapy 05/14/2013  . ACTINIC KERATOSIS 11/13/2010  . VITAMIN D DEFICIENCY 10/31/2010  . VENTRICULAR TACHYCARDIA 05/31/2010  . SYNCOPE 04/24/2010  . DYSPNEA 04/24/2010  . CATARACTS 04/20/2010  . EDEMA 02/28/2010  . MEDIAL EPICONDYLITIS 10/20/2009  . ELECTROCARDIOGRAM, ABNORMAL 09/19/2007  . Osteoarthritis of both ankles 01/01/2007  . Hyperlipidemia 08/13/2006  . HYPERTENSION, BENIGN SYSTEMIC 07/16/2006  . RHINITIS, ALLERGIC 07/16/2006  . GASTROESOPHAGEAL REFLUX, NO ESOPHAGITIS 07/16/2006  . IRRITABLE BOWEL SYNDROME 07/16/2006  . OSTEOPENIA 07/16/2006    Medications Prior to Visit Current Outpatient Prescriptions on File Prior to Visit  Medication Sig Dispense Refill  . acetaminophen (TYLENOL) 650 MG CR tablet Take 2 tablets (1,300 mg total) by mouth every 8 (eight) hours as needed for pain. 90 tablet 3  . Alcohol Swabs (ALCOHOL PREP) 70 % PADS Use to test blood sugars one time daily. DX: E11.9 100 each 3  . Blood Glucose Monitoring Suppl (TRUE METRIX GO GLUCOSE METER) w/Device KIT 1 kit by Does not apply route daily. Use to test blood sugars one time daily. DX: E11.9 1 kit 0  . Calcium Carb-Cholecalciferol (CALCIUM 600/VITAMIN D3) 600-800 MG-UNIT TABS Take 1 tablet by mouth 2 (two) times daily.    . carvedilol  (COREG) 6.25 MG tablet TAKE 1 TABLET TWICE DAILY 180 tablet 0  . furosemide (LASIX) 20 MG tablet TAKE 1 TABLET IN THE MORNING  AND TAKE 2 TABLETS IN THE EVENING 270 tablet 1  . gabapentin (NEURONTIN) 300 MG capsule Take 300 mg by mouth 4 (four) times daily.     Marland Kitchen glucose blood test strip Use to test blood sugars one time daily. DX: E11.9 100 each 12  . Lancets (TGT LANCET ULTRA THIN 30G) MISC Use to test blood sugars one time daily. DX: E11.9 100 each 3  . losartan (COZAAR) 100 MG tablet TAKE 1 TABLET EVERY DAY 90 tablet 1  . metFORMIN (GLUCOPHAGE) 500 MG tablet Take 1 tablet (500 mg total) by mouth 2 (two) times daily with a meal. 180 tablet 1  . metolazone (ZAROXOLYN) 2.5 MG tablet Take 2.5 mg by mouth daily.    . Multiple Vitamin (MULTIVITAMIN) capsule Take 1 capsule by mouth daily.      . polyvinyl alcohol-povidone (REFRESH) 1.4-0.6 % ophthalmic solution Place 1-2 drops into both eyes daily as needed (FOR DRY EYES).     . potassium chloride (K-DUR,KLOR-CON) 10 MEQ tablet TAKE 2 TABLETS TWICE DAILY (PLEASE CALL MD OFFICE AND SCHEDULE AN APPOINTMENT) 360 tablet 3  . pravastatin (PRAVACHOL) 40 MG tablet TAKE 1 TABLET EVERY DAY 90 tablet 3  . traMADol (ULTRAM) 50 MG tablet TAKE 1 TABLET  UP  TO FOUR TIMES DAILY 360 tablet 0  . verapamil (VERELAN PM) 240 MG 24 hr capsule TAKE 1 CAPSULE (240 MG TOTAL) BY MOUTH AT BEDTIME.  [place]  capsule 0  . vitamin B-12 (CYANOCOBALAMIN) 1000 MCG tablet Take 1,000 mcg by mouth daily.      Marland Kitchen warfarin (COUMADIN) 5 MG tablet TAKE 1 AND 1/2 TABLETS EVERY DAY  OR AS DIRECTED  BY  COUMADIN  CLINIC 145 tablet 1   No current facility-administered medications on file prior to visit.    Current Medications (verified) Current Outpatient Prescriptions  Medication Sig Dispense Refill  . acetaminophen (TYLENOL) 650 MG CR tablet Take 2 tablets (1,300 mg total) by mouth every 8 (eight) hours as needed for pain. 90 tablet 3  . Alcohol Swabs (ALCOHOL PREP) 70 % PADS Use to test  blood sugars one time daily. DX: E11.9 100 each 3  . Blood Glucose Monitoring Suppl (TRUE METRIX GO GLUCOSE METER) w/Device KIT 1 kit by Does not apply route daily. Use to test blood sugars one time daily. DX: E11.9 1 kit 0  . Calcium Carb-Cholecalciferol (CALCIUM 600/VITAMIN D3) 600-800 MG-UNIT TABS Take 1 tablet by mouth 2 (two) times daily.    . carvedilol (COREG) 6.25 MG tablet TAKE 1 TABLET TWICE DAILY 180 tablet 0  . furosemide (LASIX) 20 MG tablet TAKE 1 TABLET IN THE MORNING  AND TAKE 2 TABLETS IN THE EVENING 270 tablet 1  . gabapentin (NEURONTIN) 300 MG capsule Take 300 mg by mouth 4 (four) times daily.     Marland Kitchen glucose blood test strip Use to test blood sugars one time daily. DX: E11.9 100 each 12  . Lancets (TGT LANCET ULTRA THIN 30G) MISC Use to test blood sugars one time daily. DX: E11.9 100 each 3  . losartan (COZAAR) 100 MG tablet TAKE 1 TABLET EVERY DAY 90 tablet 1  . metFORMIN (GLUCOPHAGE) 500 MG tablet Take 1 tablet (500 mg total) by mouth 2 (two) times daily with a meal. 180 tablet 1  . metolazone (ZAROXOLYN) 2.5 MG tablet Take 2.5 mg by mouth daily.    . Multiple Vitamin (MULTIVITAMIN) capsule Take 1 capsule by mouth daily.      . polyvinyl alcohol-povidone (REFRESH) 1.4-0.6 % ophthalmic solution Place 1-2 drops into both eyes daily as needed (FOR DRY EYES).     . potassium chloride (K-DUR,KLOR-CON) 10 MEQ tablet TAKE 2 TABLETS TWICE DAILY (PLEASE CALL MD OFFICE AND SCHEDULE AN APPOINTMENT) 360 tablet 3  . pravastatin (PRAVACHOL) 40 MG tablet TAKE 1 TABLET EVERY DAY 90 tablet 3  . traMADol (ULTRAM) 50 MG tablet TAKE 1 TABLET  UP  TO FOUR TIMES DAILY 360 tablet 0  . verapamil (VERELAN PM) 240 MG 24 hr capsule TAKE 1 CAPSULE (240 MG TOTAL) BY MOUTH AT BEDTIME.  90 capsule 0  . vitamin B-12 (CYANOCOBALAMIN) 1000 MCG tablet Take 1,000 mcg by mouth daily.      Marland Kitchen warfarin (COUMADIN) 5 MG tablet TAKE 1 AND 1/2 TABLETS EVERY DAY  OR AS DIRECTED  BY  COUMADIN  CLINIC 145 tablet 1   No  current facility-administered medications for this visit.     Allergies (verified) Amlodipine besylate-valsartan   PAST HISTORY  Family History Family History  Problem Relation Age of Onset  . Arrhythmia Mother     afib  . Alzheimer's disease Mother   . Colon cancer Father   . Heart attack Father 8  . Diabetes Sister   . Arrhythmia Sister     afib    Social History Social History  Substance Use Topics  . Smoking status: Former Smoker    Quit date: 10/08/1970  . Smokeless tobacco:  Not on file  . Alcohol Use: No     Are there smokers in your home (other than you)? No  Risk Factors Current exercise habits: has been trying to garden some.  she is limited in her mobility.   Dietary issues discussed: discussed fruit intake    Cardiac risk factors: advanced age (older than 53 for men, 75 for women), hypertension and sedentary lifestyle.  Depression Screen (Note: if answer to either of the following is "Yes", a more complete depression screening is indicated)   Over the past two weeks, have you felt down, depressed or hopeless? No  Over the past two weeks, have you felt little interest or pleasure in doing things? No  Have you lost interest or pleasure in daily life? No  Do you often feel hopeless? No  Do you cry easily over simple problems? No  Activities of Daily Living In your present state of health, do you have any difficulty performing the following activities?:  Driving? No Managing money?  No Feeding yourself? No Getting from bed to chair? No Climbing a flight of stairs? No Preparing food and eating?: No Bathing or showering? No Getting dressed: No Getting to the toilet? No Using the toilet:No Moving around from place to place: No In the past year have you fallen or had a near fall?:Yes.  She reports 3 near falls but did not actually fall and injure herself. She felt like it was related to her dizziness.   Are you sexually active?  No  Do you have more  than one partner?  No  Hearing Difficulties: No Do you often ask people to speak up or repeat themselves? No Do you experience ringing or noises in your ears? No Do you have difficulty understanding soft or whispered voices? No   Do you feel that you have a problem with memory? No  Do you often misplace items? No  Do you feel safe at home?  Yes  Cognitive Testing  Alert? Yes  Normal Appearance?Yes  Oriented to person? Yes  Place? Yes   Time? Yes  Recall of three objects?  Yes  Can perform simple calculations? Yes  Displays appropriate judgment?Yes  Can read the correct time from a watch face?Yes   Advanced Directives have been discussed with the patient? No  List the Names of Other Physician/Practitioners you currently use: 1.    Indicate any recent Medical Services you may have received from other than [facility] providers in the past year (date may be approximate).  Immunization History  Administered Date(s) Administered  . Influenza Whole 08/07/2006, 08/05/2007, 07/08/2009, 07/25/2009, 06/30/2010  . Influenza,inj,Quad PF,36+ Mos 06/11/2013, 06/18/2014, 06/20/2015  . Pneumococcal Conjugate-13 07/19/2014  . Pneumococcal Polysaccharide-23 10/08/2002  . Td 07/25/2009  . Zoster 09/21/2009    Screening Tests Health Maintenance  Topic Date Due  . OPHTHALMOLOGY EXAM  10/17/1945  . PNA vac Low Risk Adult (2 of 2 - PPSV23) 07/20/2015  . INFLUENZA VACCINE  05/08/2016  . HEMOGLOBIN A1C  07/24/2016  . FOOT EXAM  01/22/2017  . TETANUS/TDAP  07/26/2019  . DEXA SCAN  Completed  . ZOSTAVAX  Completed    All answers were reviewed with the patient and necessary referrals were made:  Christop Hippert, MD   02/07/2016   History reviewed: allergies, current medications, past family history, past medical history, past social history, past surgical history and problem list  Review of Systems A comprehensive review of systems was negative.    Objective:     Vision  by Snellen chart:  see vision   Body mass index is 46.42 kg/(m^2). BP 132/75 mmHg  Pulse 70  Wt 262 lb (118.842 kg)  SpO2 98%  BP 132/75 mmHg  Pulse 70  Wt 262 lb (118.842 kg)  SpO2 98% General appearance: alert, cooperative and appears stated age Head: Normocephalic, without obvious abnormality, atraumatic Eyes: conj clear, EOMI, pEERLA Ears: normal TM's and external ear canals both ears Nose: Nares normal. Septum midline. Mucosa normal. No drainage or sinus tenderness. Throat: lips, mucosa, and tongue normal; teeth and gums normal Neck: no adenopathy, no carotid bruit, no JVD, supple, symmetrical, trachea midline and thyroid not enlarged, symmetric, no tenderness/mass/nodules Back: symmetric, no curvature. ROM normal. No CVA tenderness. Lungs: clear to auscultation bilaterally Breasts: normal appearance, no masses or tenderness Heart: regular rate and rhythm, S1, S2 normal, no murmur, click, rub or gallop Abdomen: soft, non-tender; bowel sounds normal; no masses,  no organomegaly Extremities: extremities normal, atraumatic, no cyanosis or edema Pulses: 2+ and symmetric Skin: Skin color, texture, turgor normal. No rashes or lesions Lymph nodes: Cervical, supraclavicular, and axillary nodes normal. Neurologic: Alert and oriented X 3, normal strength and tone. Normal symmetric reflexes. Normal coordination and gait     Assessment:     Medicare WEllness exam       Plan:     During the course of the visit the patient was educated and counseled about appropriate screening and preventive services including:    Screening mammography  eye exam   Atrial fib - she is on coumading and symptomatic. Has an echo scheduled later this month.    BMI 40 - reviewed importance of regular exercise. Encouraged her to check into doing pool/water aerobics and she does have access to silver sneakers through her health insurance. Explained that she would be able to work out without any further injury to her  knees or ankles.  Diet review for nutrition referral? Yes ____  Not Indicated ___x_   Patient Instructions (the written plan) was given to the patient.  Medicare Attestation I have personally reviewed: The patient's medical and social history Their use of alcohol, tobacco or illicit drugs Their current medications and supplements The patient's functional ability including ADLs,fall risks, home safety risks, cognitive, and hearing and visual impairment Diet and physical activities Evidence for depression or mood disorders  The patient's weight, height, BMI, and visual acuity have been recorded in the chart.  I have made referrals, counseling, and provided education to the patient based on review of the above and I have provided the patient with a written personalized care plan for preventive services.     ,, MD   02/07/2016

## 2016-02-07 NOTE — Patient Instructions (Signed)
Keep up a regular exercise program and make sure you are eating a healthy diet Try to eat 4 servings of dairy a day, or if you are lactose intolerant take a calcium with vitamin D daily.  Your vaccines are up to date.   

## 2016-02-10 ENCOUNTER — Ambulatory Visit (INDEPENDENT_AMBULATORY_CARE_PROVIDER_SITE_OTHER): Payer: Commercial Managed Care - HMO | Admitting: Family Medicine

## 2016-02-10 DIAGNOSIS — I4891 Unspecified atrial fibrillation: Secondary | ICD-10-CM

## 2016-02-10 LAB — POCT INR: INR: 2.5

## 2016-02-10 NOTE — Progress Notes (Signed)
Left information on Pt's VM. Callback provided to schedule follow up.

## 2016-02-21 ENCOUNTER — Encounter: Payer: Self-pay | Admitting: Cardiovascular Disease

## 2016-02-21 NOTE — Progress Notes (Signed)
Patient ID: Charlotte [name], female   DOB: 1936-02-19, 80 y.o.   MRN: [id number]   She has a history of PAF , LBBB, HL, HTN. Dobutamine echocardiogram done in 06/2010 for syncope and demonstrated nonsustained ventricular tachycardia. EF at that time was normal. LHC 06/2010: EF 55%, mild plaque in the LAD 20-30%, otherwise normal coronary arteries. Atrial fibrillation noted 2014 Echocardiogram 02/2013: Moderate LVH, EF 25-30%, anteroseptal and apical HK, moderate AI, MAC, moderate MR, moderate LAE, mild RAE, PASP 32. Patient was placed on Xarelto for anticoagulation. She underwent DCCV 04/01/13 after several weeks of uninterrupted treatment with Xarelto. She is doing well. Notes less dyspnea. She reports NYHA Class IIb symptoms. No orthopnea, PND. LE edema is improved. No CP, syncope or palpitations.   Changed to coumadin  due to cost  But now thinks she wants to change   Echo 06/02/14  Only mild AR reviewed   Study Conclusions  - Left ventricle: The cavity size was normal. Wall thickness was increased in a pattern of severe LVH. Systolic function was normal. The estimated ejection fraction was in the range of 55% to 60%. Doppler parameters are consistent with abnormal left ventricular relaxation (grade 1 diastolic dysfunction). - Aortic valve: There was mild regurgitation. - Left atrium: The atrium was mildly dilated. - Atrial septum: No defect or patent foramen ovale was identified.   ROS: Denies fever, malais, weight loss, blurry vision, decreased visual acuity, cough, sputum, SOB, hemoptysis, pleuritic pain, palpitaitons, heartburn, abdominal pain, melena, lower extremity edema, claudication, or rash.  All other systems reviewed and negative  General: Affect appropriate Overweight whtie female  HEENT: normal Neck supple with no adenopathy JVP normal no bruits no thyromegaly Lungs clear with no wheezing and good diaphragmatic motion Heart:  S1/S2 SEM murmur, no rub, gallop or click PMI  normal Abdomen: benighn, BS positve, no tenderness, no AAA no bruit.  No HSM or HJR Distal pulses intact with no bruits Plus one bilateral  Edema with varicose veins Neuro non-focal Skin warm and dry No muscular weakness   Current Outpatient Prescriptions  Medication Sig Dispense Refill  . acetaminophen (TYLENOL) 650 MG CR tablet Take 2 tablets (1,300 mg total) by mouth every 8 (eight) hours as needed for pain. 90 tablet 3  . Calcium Carb-Cholecalciferol (CALCIUM 600/VITAMIN D3) 600-800 MG-UNIT TABS Take 1 tablet by mouth 2 (two) times daily.    . carvedilol (COREG) 6.25 MG tablet Take 6.25 mg by mouth daily.    . furosemide (LASIX) 20 MG tablet Take 20 mg by mouth daily.    Marland Kitchen gabapentin (NEURONTIN) 300 MG capsule Take 300 mg by mouth 4 (four) times daily.     Marland Kitchen losartan (COZAAR) 100 MG tablet Take 100 mg by mouth daily.    . metFORMIN (GLUCOPHAGE) 500 MG tablet Take 1 tablet (500 mg total) by mouth 2 (two) times daily with a meal. 180 tablet 1  . metolazone (ZAROXOLYN) 2.5 MG tablet Take 2.5 mg by mouth every other day.    . Multiple Vitamin (MULTIVITAMIN) capsule Take 1 capsule by mouth daily.      . polyvinyl alcohol-povidone (REFRESH) 1.4-0.6 % ophthalmic solution Place 1-2 drops into both eyes daily as needed (FOR DRY EYES).     . potassium chloride (MICRO-K) 10 MEQ CR capsule Take 10 mEq by mouth 2 (two) times daily.    . pravastatin (PRAVACHOL) 40 MG tablet Take 40 mg by mouth daily.    . traMADol (ULTRAM) 50 MG tablet Take 50 mg  by mouth every 6 (six) hours as needed for moderate pain or severe pain.    . verapamil (VERELAN PM) 240 MG 24 hr capsule TAKE 1 CAPSULE (240 MG TOTAL) BY MOUTH AT BEDTIME.  90 capsule 0  . vitamin B-12 (CYANOCOBALAMIN) 1000 MCG tablet Take 1,000 mcg by mouth daily.      Marland Kitchen warfarin (COUMADIN) 5 MG tablet TAKE 1 AND 1/2 TABLETS EVERY DAY  OR AS DIRECTED  BY  COUMADIN  CLINIC 145 tablet 1   No current facility-administered medications for this visit.     Allergies  Amlodipine besylate-valsartan  Electrocardiogram:  SR rate 71  Loss of precordial R waves 8/15   Assessment and Plan PAF: Maint NSR with no palpitations DCM: EF improved 50-55% update echo continue current medical Rx HTN: Well controlled.  Continue current medications and low sodium Dash type diet.   Chol:  Lab Results  Component Value Date   LDLCALC 74 02/01/2016    LBBB chronic no high grade AV block yearly ECG Anticoagulation  Getting BS checks and doesn't want that with INR checks wants to change to NOAC Will check INR today and if less than 3 start xaretlto 20 mg  Cr normal on 02/01/16  Lab Results  Component Value Date   CREATININE 0.72 02/01/2016   BUN 18 02/01/2016   NA 137 02/01/2016   K 3.7 02/01/2016   CL 99 02/01/2016   CO2 25 02/01/2016     Charlton Haws

## 2016-02-27 ENCOUNTER — Ambulatory Visit (HOSPITAL_COMMUNITY): Payer: Commercial Managed Care - HMO | Attending: Cardiology

## 2016-02-27 ENCOUNTER — Ambulatory Visit (INDEPENDENT_AMBULATORY_CARE_PROVIDER_SITE_OTHER): Payer: Commercial Managed Care - HMO | Admitting: Cardiovascular Disease

## 2016-02-27 ENCOUNTER — Encounter: Payer: Self-pay | Admitting: Cardiovascular Disease

## 2016-02-27 ENCOUNTER — Other Ambulatory Visit: Payer: Self-pay

## 2016-02-27 VITALS — BP 114/60 | HR 64 | Ht 62.0 in | Wt 262.8 lb

## 2016-02-27 DIAGNOSIS — I351 Nonrheumatic aortic (valve) insufficiency: Secondary | ICD-10-CM | POA: Insufficient documentation

## 2016-02-27 DIAGNOSIS — E669 Obesity, unspecified: Secondary | ICD-10-CM | POA: Insufficient documentation

## 2016-02-27 DIAGNOSIS — Z6841 Body Mass Index (BMI) 40.0 and over, adult: Secondary | ICD-10-CM | POA: Diagnosis not present

## 2016-02-27 DIAGNOSIS — E785 Hyperlipidemia, unspecified: Secondary | ICD-10-CM | POA: Diagnosis not present

## 2016-02-27 DIAGNOSIS — Z7901 Long term (current) use of anticoagulants: Secondary | ICD-10-CM | POA: Diagnosis not present

## 2016-02-27 DIAGNOSIS — I429 Cardiomyopathy, unspecified: Secondary | ICD-10-CM

## 2016-02-27 DIAGNOSIS — I4891 Unspecified atrial fibrillation: Secondary | ICD-10-CM | POA: Diagnosis not present

## 2016-02-27 DIAGNOSIS — I10 Essential (primary) hypertension: Secondary | ICD-10-CM | POA: Diagnosis not present

## 2016-02-27 LAB — POCT INR: INR: 3.2

## 2016-02-27 MED ORDER — RIVAROXABAN 20 MG PO TABS
20.0000 mg | ORAL_TABLET | Freq: Every day | ORAL | Status: DC
Start: 2016-02-27 — End: 2017-03-07

## 2016-02-27 NOTE — Patient Instructions (Addendum)
Medication Instructions:  Your physician has recommended you make the following change in your medication:  1-START Xarelto 20 mg by mouth daily 2-STOP Coumadin as directed by coumadin clinic  Labwork: NONE  Testing/Procedures: NONE  Follow-Up: Your physician wants you to follow-up in: 6 months with Dr. Eden Emms. You will receive a reminder letter in the mail two months in advance. If you don't receive a letter, please call our office to schedule the follow-up appointment.   If you need a refill on your cardiac medications before your next appointment, please call your pharmacy.

## 2016-02-27 NOTE — Addendum Note (Signed)
Addended by: Audrie Lia R on: 02/27/2016 03:05 PM   Modules accepted: Orders

## 2016-02-28 ENCOUNTER — Other Ambulatory Visit: Payer: Self-pay | Admitting: Cardiovascular Disease

## 2016-02-28 NOTE — Telephone Encounter (Signed)
Rx refill sent to pharmacy. 

## 2016-03-07 ENCOUNTER — Other Ambulatory Visit: Payer: Self-pay

## 2016-03-07 VITALS — Wt 260.2 lb

## 2016-03-07 DIAGNOSIS — I509 Heart failure, unspecified: Secondary | ICD-10-CM

## 2016-03-07 NOTE — Patient Outreach (Signed)
[facility] service [facility]) Care Management  03/07/2016  [name] Dec 29, 1935 [id number]   Telephone contact with patient.  She reports good self management of CHF and Diabetes.  Her weights have ranged from 256 to 260 with no sudden increases.  Her most recent blood sugars were 100 and a non-fasting 123.    She did report a recent fall outside in her yard, with no injuries other than sore ribs.  She has been put on Xeralto and taken off of Coumadin.  She states a recent echo-cardiogram report was 'good'.  Plan:  Follow-up in approximately one month.  [name], RN, MSN RN [employer] [phone] Fax [phone]

## 2016-04-04 ENCOUNTER — Other Ambulatory Visit: Payer: Self-pay

## 2016-04-04 VITALS — Wt 256.6 lb

## 2016-04-04 DIAGNOSIS — I509 Heart failure, unspecified: Secondary | ICD-10-CM

## 2016-04-04 NOTE — Patient Outreach (Signed)
[facility] Care Management  04/04/2016  Charlotte [name] [birth year] [id number]  Telephone assessment and case closure today.  Patient reports she is doing well.  She is successfully managing her congestive heart failure and a more recent diagnosis of diabetes.  She has met the goals we identified.  Plan:  Patient will continue self-management strategies.           Patient will call [facility] if she has further needs in the future.           RN will send case closure letter to PCP.  Candie Mile, RN, MSN [place] [phone] Fax [phone]

## 2016-04-05 ENCOUNTER — Other Ambulatory Visit: Payer: Self-pay | Admitting: Cardiovascular Disease

## 2016-04-12 ENCOUNTER — Other Ambulatory Visit: Payer: Self-pay | Admitting: Cardiovascular Disease

## 2016-04-23 ENCOUNTER — Encounter: Payer: Self-pay | Admitting: Family Medicine

## 2016-04-23 ENCOUNTER — Ambulatory Visit (INDEPENDENT_AMBULATORY_CARE_PROVIDER_SITE_OTHER): Payer: Commercial Managed Care - HMO | Admitting: Family Medicine

## 2016-04-23 VITALS — BP 122/67 | HR 67 | Wt 259.0 lb

## 2016-04-23 DIAGNOSIS — I10 Essential (primary) hypertension: Secondary | ICD-10-CM | POA: Diagnosis not present

## 2016-04-23 DIAGNOSIS — I482 Chronic atrial fibrillation, unspecified: Secondary | ICD-10-CM

## 2016-04-23 DIAGNOSIS — E1169 Type 2 diabetes mellitus with other specified complication: Secondary | ICD-10-CM

## 2016-04-23 DIAGNOSIS — E119 Type 2 diabetes mellitus without complications: Secondary | ICD-10-CM

## 2016-04-23 DIAGNOSIS — E669 Obesity, unspecified: Secondary | ICD-10-CM | POA: Diagnosis not present

## 2016-04-23 DIAGNOSIS — Z0100 Encounter for examination of eyes and vision without abnormal findings: Secondary | ICD-10-CM

## 2016-04-23 NOTE — Progress Notes (Signed)
Subjective:    CC: DM  HPI: Diabetes - no hypoglycemic events. No wounds or sores that are not healing well. No increased thirst or urination. Checking glucose at home. Taking medications as prescribed without any side effects.  Hypertension- Pt denies chest pain, SOB, dizziness, or heart palpitations.  Taking meds as directed w/o problems.  Denies medication side effects.    Atrial fibrillation-now on Xarelto and off of Coumadin.  She was having a lot of billing problems with getting her Coumadin checked here. She will get a charge for about $16 each time she came and it was supposed to be a free/covered visit. She is doing well on the Xarelto without any side effects   Past medical history, Surgical history, Family history not pertinant except as noted below, Social history, Allergies, and medications have been entered into the medical record, reviewed, and corrections made.   Review of Systems: No fevers, chills, night sweats, weight loss, chest pain, or shortness of breath.   Objective:    General: Well Developed, well nourished, and in no acute distress.  Neuro: Alert and oriented x3, extra-ocular muscles intact, sensation grossly intact.  HEENT: Normocephalic, atraumatic  Skin: Warm and dry, no rashes. Cardiac: Regular rate and rhythm, no murmurs rubs or gallops, no lower extremity edema.  Respiratory: Clear to auscultation bilaterally. Not using accessory muscles, speaking in full sentences.   Impression and Recommendations:   DM - Due for A1c today. Unfortunately we are out of test strips and she will have to go to the lab. I'll call her with those results and we can make adjustments if needed.  HTN - well controlled. Continue current regimen. Next  Atrial fibrillation-she's now on Xarelto and doing well without any side effects.

## 2016-04-24 LAB — HEMOGLOBIN A1C
HEMOGLOBIN A1C: 5.8 % — AB (ref <5.7)
MEAN PLASMA GLUCOSE: 120 mg/dL

## 2016-04-26 ENCOUNTER — Other Ambulatory Visit: Payer: Self-pay | Admitting: Family Medicine

## 2016-04-27 ENCOUNTER — Other Ambulatory Visit: Payer: Self-pay | Admitting: Cardiovascular Disease

## 2016-04-27 NOTE — Telephone Encounter (Signed)
In reviewing chart patient might have reported taking coreg only once a day, there is no record on it changing in her chart. Called patient to confirm how she is taking medication. Patient is taking coreg twice daily. Sent in refill.

## 2016-04-27 NOTE — Telephone Encounter (Signed)
Should this now be once daily? Please advise. Thanks, MI

## 2016-05-07 ENCOUNTER — Ambulatory Visit: Payer: Commercial Managed Care - HMO

## 2016-06-13 ENCOUNTER — Other Ambulatory Visit: Payer: Self-pay | Admitting: Family Medicine

## 2016-06-18 ENCOUNTER — Encounter: Payer: Self-pay | Admitting: Family Medicine

## 2016-06-18 DIAGNOSIS — E119 Type 2 diabetes mellitus without complications: Secondary | ICD-10-CM | POA: Diagnosis not present

## 2016-06-18 DIAGNOSIS — H43813 Vitreous degeneration, bilateral: Secondary | ICD-10-CM | POA: Diagnosis not present

## 2016-06-18 DIAGNOSIS — H02831 Dermatochalasis of right upper eyelid: Secondary | ICD-10-CM | POA: Diagnosis not present

## 2016-06-18 DIAGNOSIS — H02834 Dermatochalasis of left upper eyelid: Secondary | ICD-10-CM | POA: Diagnosis not present

## 2016-06-18 LAB — HM DIABETES EYE EXAM

## 2016-06-25 DIAGNOSIS — M546 Pain in thoracic spine: Secondary | ICD-10-CM | POA: Diagnosis not present

## 2016-06-25 DIAGNOSIS — M545 Low back pain: Secondary | ICD-10-CM | POA: Diagnosis not present

## 2016-06-25 DIAGNOSIS — M9902 Segmental and somatic dysfunction of thoracic region: Secondary | ICD-10-CM | POA: Diagnosis not present

## 2016-06-25 DIAGNOSIS — M9903 Segmental and somatic dysfunction of lumbar region: Secondary | ICD-10-CM | POA: Diagnosis not present

## 2016-06-28 DIAGNOSIS — M9903 Segmental and somatic dysfunction of lumbar region: Secondary | ICD-10-CM | POA: Diagnosis not present

## 2016-06-28 DIAGNOSIS — M9902 Segmental and somatic dysfunction of thoracic region: Secondary | ICD-10-CM | POA: Diagnosis not present

## 2016-06-28 DIAGNOSIS — M545 Low back pain: Secondary | ICD-10-CM | POA: Diagnosis not present

## 2016-06-28 DIAGNOSIS — M546 Pain in thoracic spine: Secondary | ICD-10-CM | POA: Diagnosis not present

## 2016-07-02 DIAGNOSIS — M9903 Segmental and somatic dysfunction of lumbar region: Secondary | ICD-10-CM | POA: Diagnosis not present

## 2016-07-02 DIAGNOSIS — M9902 Segmental and somatic dysfunction of thoracic region: Secondary | ICD-10-CM | POA: Diagnosis not present

## 2016-07-02 DIAGNOSIS — M546 Pain in thoracic spine: Secondary | ICD-10-CM | POA: Diagnosis not present

## 2016-07-02 DIAGNOSIS — M545 Low back pain: Secondary | ICD-10-CM | POA: Diagnosis not present

## 2016-07-03 DIAGNOSIS — M9903 Segmental and somatic dysfunction of lumbar region: Secondary | ICD-10-CM | POA: Diagnosis not present

## 2016-07-03 DIAGNOSIS — M546 Pain in thoracic spine: Secondary | ICD-10-CM | POA: Diagnosis not present

## 2016-07-03 DIAGNOSIS — M9902 Segmental and somatic dysfunction of thoracic region: Secondary | ICD-10-CM | POA: Diagnosis not present

## 2016-07-03 DIAGNOSIS — M545 Low back pain: Secondary | ICD-10-CM | POA: Diagnosis not present

## 2016-07-04 DIAGNOSIS — M9903 Segmental and somatic dysfunction of lumbar region: Secondary | ICD-10-CM | POA: Diagnosis not present

## 2016-07-04 DIAGNOSIS — M9902 Segmental and somatic dysfunction of thoracic region: Secondary | ICD-10-CM | POA: Diagnosis not present

## 2016-07-04 DIAGNOSIS — M546 Pain in thoracic spine: Secondary | ICD-10-CM | POA: Diagnosis not present

## 2016-07-04 DIAGNOSIS — M545 Low back pain: Secondary | ICD-10-CM | POA: Diagnosis not present

## 2016-07-09 DIAGNOSIS — M545 Low back pain: Secondary | ICD-10-CM | POA: Diagnosis not present

## 2016-07-09 DIAGNOSIS — M546 Pain in thoracic spine: Secondary | ICD-10-CM | POA: Diagnosis not present

## 2016-07-09 DIAGNOSIS — M9903 Segmental and somatic dysfunction of lumbar region: Secondary | ICD-10-CM | POA: Diagnosis not present

## 2016-07-09 DIAGNOSIS — M9902 Segmental and somatic dysfunction of thoracic region: Secondary | ICD-10-CM | POA: Diagnosis not present

## 2016-07-10 ENCOUNTER — Other Ambulatory Visit: Payer: Self-pay | Admitting: Family Medicine

## 2016-07-10 DIAGNOSIS — Z780 Asymptomatic menopausal state: Secondary | ICD-10-CM

## 2016-07-11 ENCOUNTER — Ambulatory Visit (INDEPENDENT_AMBULATORY_CARE_PROVIDER_SITE_OTHER): Payer: Commercial Managed Care - HMO

## 2016-07-11 DIAGNOSIS — Z780 Asymptomatic menopausal state: Secondary | ICD-10-CM

## 2016-07-11 DIAGNOSIS — M545 Low back pain: Secondary | ICD-10-CM | POA: Diagnosis not present

## 2016-07-11 DIAGNOSIS — M9902 Segmental and somatic dysfunction of thoracic region: Secondary | ICD-10-CM | POA: Diagnosis not present

## 2016-07-11 DIAGNOSIS — M546 Pain in thoracic spine: Secondary | ICD-10-CM | POA: Diagnosis not present

## 2016-07-11 DIAGNOSIS — M9903 Segmental and somatic dysfunction of lumbar region: Secondary | ICD-10-CM | POA: Diagnosis not present

## 2016-07-11 DIAGNOSIS — Z13820 Encounter for screening for osteoporosis: Secondary | ICD-10-CM | POA: Diagnosis not present

## 2016-07-11 DIAGNOSIS — E2839 Other primary ovarian failure: Secondary | ICD-10-CM | POA: Diagnosis not present

## 2016-07-13 DIAGNOSIS — M9902 Segmental and somatic dysfunction of thoracic region: Secondary | ICD-10-CM | POA: Diagnosis not present

## 2016-07-13 DIAGNOSIS — M545 Low back pain: Secondary | ICD-10-CM | POA: Diagnosis not present

## 2016-07-13 DIAGNOSIS — M546 Pain in thoracic spine: Secondary | ICD-10-CM | POA: Diagnosis not present

## 2016-07-13 DIAGNOSIS — M9903 Segmental and somatic dysfunction of lumbar region: Secondary | ICD-10-CM | POA: Diagnosis not present

## 2016-07-16 DIAGNOSIS — M545 Low back pain: Secondary | ICD-10-CM | POA: Diagnosis not present

## 2016-07-16 DIAGNOSIS — M546 Pain in thoracic spine: Secondary | ICD-10-CM | POA: Diagnosis not present

## 2016-07-16 DIAGNOSIS — M9903 Segmental and somatic dysfunction of lumbar region: Secondary | ICD-10-CM | POA: Diagnosis not present

## 2016-07-16 DIAGNOSIS — M9902 Segmental and somatic dysfunction of thoracic region: Secondary | ICD-10-CM | POA: Diagnosis not present

## 2016-07-18 DIAGNOSIS — M545 Low back pain: Secondary | ICD-10-CM | POA: Diagnosis not present

## 2016-07-18 DIAGNOSIS — M9903 Segmental and somatic dysfunction of lumbar region: Secondary | ICD-10-CM | POA: Diagnosis not present

## 2016-07-18 DIAGNOSIS — M9902 Segmental and somatic dysfunction of thoracic region: Secondary | ICD-10-CM | POA: Diagnosis not present

## 2016-07-18 DIAGNOSIS — M546 Pain in thoracic spine: Secondary | ICD-10-CM | POA: Diagnosis not present

## 2016-07-24 ENCOUNTER — Ambulatory Visit (INDEPENDENT_AMBULATORY_CARE_PROVIDER_SITE_OTHER): Payer: Commercial Managed Care - HMO | Admitting: Family Medicine

## 2016-07-24 ENCOUNTER — Encounter: Payer: Self-pay | Admitting: Family Medicine

## 2016-07-24 VITALS — BP 120/67 | HR 64 | Ht 62.0 in | Wt 253.0 lb

## 2016-07-24 DIAGNOSIS — E1169 Type 2 diabetes mellitus with other specified complication: Secondary | ICD-10-CM

## 2016-07-24 DIAGNOSIS — I482 Chronic atrial fibrillation, unspecified: Secondary | ICD-10-CM

## 2016-07-24 DIAGNOSIS — Z23 Encounter for immunization: Secondary | ICD-10-CM

## 2016-07-24 DIAGNOSIS — E669 Obesity, unspecified: Secondary | ICD-10-CM | POA: Diagnosis not present

## 2016-07-24 DIAGNOSIS — I10 Essential (primary) hypertension: Secondary | ICD-10-CM

## 2016-07-24 LAB — POCT GLYCOSYLATED HEMOGLOBIN (HGB A1C): Hemoglobin A1C: 5.6

## 2016-07-24 NOTE — Progress Notes (Signed)
Subjective:    CC: DM  HPI: Diabetes - no hypoglycemic events. No wounds or sores that are not healing well. No increased thirst or urination. Checking glucose at home. Taking medications as prescribed without any side effects. Lab Results  Component Value Date   HGBA1C 5.8 (H) 04/23/2016   Hypertension- Pt denies chest pain, SOB, dizziness, or heart palpitations.  Taking meds as directed w/o problems.  Denies medication side effects.    Afib - she is on Xarelto. She is doing well. No bleeding.   Past medical history, Surgical history, Family history not pertinant except as noted below, Social history, Allergies, and medications have been entered into the medical record, reviewed, and corrections made.   Review of Systems: No fevers, chills, night sweats, weight loss, chest pain, or shortness of breath.   Objective:    General: Well Developed, well nourished, and in no acute distress.  Neuro: Alert and oriented x3, extra-ocular muscles intact, sensation grossly intact.  HEENT: Normocephalic, atraumatic, no thyromegaly Skin: Warm and dry, no rashes. Cardiac: Regular rate and rhythm, no murmurs rubs or gallops, no lower extremity edema.  Respiratory: Clear to auscultation bilaterally. Not using accessory muscles, speaking in full sentences.   Impression and Recommendations:   DM- well controlled. A1C of 5.6.  Continue current regimen. Follow-up in 3-4 months.  HTN - Well controlled. Continue current regimen. Follow up in  3-4 mo.   Afib - On Xareltop.

## 2016-07-26 LAB — BASIC METABOLIC PANEL WITH GFR
BUN: 34 mg/dL — AB (ref 7–25)
CHLORIDE: 99 mmol/L (ref 98–110)
CO2: 26 mmol/L (ref 20–31)
CREATININE: 0.89 mg/dL (ref 0.60–0.93)
Calcium: 9.2 mg/dL (ref 8.6–10.4)
GFR, Est African American: 72 mL/min (ref >=60)
GFR, Est Non African American: 62 mL/min (ref >=60)
Glucose, Bld: 110 mg/dL — ABNORMAL HIGH (ref 65–99)
Potassium: 3.8 mmol/L (ref 3.5–5.3)
Sodium: 137 mmol/L (ref 135–146)

## 2016-08-21 ENCOUNTER — Other Ambulatory Visit: Payer: Self-pay

## 2016-08-21 MED ORDER — GABAPENTIN 300 MG PO CAPS: 300.0000 mg | ORAL_CAPSULE | Freq: Four times a day (QID) | ORAL | 1 refills | Status: DC

## 2016-09-17 ENCOUNTER — Other Ambulatory Visit: Payer: Self-pay | Admitting: Family Medicine

## 2016-11-12 NOTE — Progress Notes (Signed)
Patient ID: Charlotte [name], female   DOB: [birth year], 81 y.o.   MRN: [id number]   She has a history of PAF , LBBB, HL, HTN. Dobutamine echocardiogram done in 06/2010 for syncope and demonstrated nonsustained ventricular tachycardia. EF at that time was normal. LHC 06/2010: EF 55%, mild plaque in the LAD 20-30%, otherwise normal coronary arteries. Atrial fibrillation noted 2014 Echocardiogram 02/2013: Moderate LVH, EF 25-30%, anteroseptal and apical HK, moderate AI, MAC, moderate MR, moderate LAE, mild RAE, PASP 32. Patient was placed on Xarelto for anticoagulation. She underwent DCCV 04/01/13 after several weeks of uninterrupted treatment with Xarelto. She is doing well. Notes less dyspnea. She reports NYHA Class IIb symptoms. No orthopnea, PND. LE edema is improved. No CP, syncope or palpitations.   Changed to coumadin  due to cost  But now thinks she wants to change   Echo 02/27/16  EF 00-34%  Grade 1 diastolic Mild AR   Seeing chiropractor since 07/2016 for her back and scoliosis with a lot Of improvement   ROS: Denies fever, malais, weight loss, blurry vision, decreased visual acuity, cough, sputum, SOB, hemoptysis, pleuritic pain, palpitaitons, heartburn, abdominal pain, melena, lower extremity edema, claudication, or rash.  All other systems reviewed and negative  General: Affect appropriate Overweight whtie female  HEENT: normal Neck supple with no adenopathy JVP normal no bruits no thyromegaly Lungs clear with no wheezing and good diaphragmatic motion Heart:  S1/S2 SEM murmur, no rub, gallop or click PMI normal Abdomen: benighn, BS positve, no tenderness, no AAA no bruit.  No HSM or HJR Distal pulses intact with no bruits Plus one bilateral  Edema with varicose veins Neuro non-focal Skin warm and dry No muscular weakness   Current Outpatient Prescriptions  Medication Sig Dispense Refill  . acetaminophen (TYLENOL) 650 MG CR tablet Take 2 tablets (1,300 mg total) by mouth every 8  (eight) hours as needed for pain. 90 tablet 3  . Calcium Carb-Cholecalciferol (CALCIUM 600/VITAMIN D3) 600-800 MG-UNIT TABS Take 1 tablet by mouth 2 (two) times daily.    . carvedilol (COREG) 6.25 MG tablet Take 6.25 mg by mouth 2 (two) times daily.    . furosemide (LASIX) 20 MG tablet TAKE 1 TAB BY MOUTH IN THE AM & 2 TABS BY MOUTH IN THE PM    . gabapentin (NEURONTIN) 300 MG capsule Take 1 capsule (300 mg total) by mouth 4 (four) times daily. 270 capsule 1  . losartan (COZAAR) 100 MG tablet Take 100 mg by mouth daily.    . metFORMIN (GLUCOPHAGE) 500 MG tablet Take 500 mg by mouth 2 (two) times daily.    . metolazone (ZAROXOLYN) 2.5 MG tablet Take 2.5 mg by mouth every other day.    . Multiple Vitamin (MULTIVITAMIN) capsule Take 1 capsule by mouth daily.      . polyvinyl alcohol-povidone (REFRESH) 1.4-0.6 % ophthalmic solution Place 1-2 drops into both eyes daily as needed (FOR DRY EYES).     . potassium chloride (MICRO-K) 10 MEQ CR capsule Take 10 mEq by mouth 2 (two) times daily.    . pravastatin (PRAVACHOL) 40 MG tablet Take 40 mg by mouth daily.    . rivaroxaban (XARELTO) 20 MG TABS tablet Take 1 tablet (20 mg total) by mouth daily with supper. 30 tablet 11  . traMADol (ULTRAM) 50 MG tablet Take 50 mg by mouth every 6 (six) hours as needed.    . verapamil (VERELAN PM) 240 MG 24 hr capsule Take 1 capsule (240 mg total) by  mouth at bedtime. 90 capsule 3  . vitamin B-12 (CYANOCOBALAMIN) 1000 MCG tablet Take 1,000 mcg by mouth daily.       No current facility-administered medications for this visit.     Allergies  Amlodipine besylate-valsartan  Electrocardiogram:  SR rate 71  Loss of precordial R waves 8/15   11/16/16  SR rate 71 LBBB LAD   Assessment and Plan  PAF: Maint NSR with no palpitations DCM: EF is normalized by echo 2017 continue medical Rx  HTN: Well controlled.  Continue current medications and low sodium Dash type diet.   Chol:  Lab Results  Component Value Date    LDLCALC 74 02/01/2016    LBBB chronic no high grade AV block yearly ECG Anticoagulation  On Xarelto now more convenient and no issues  Lab Results  Component Value Date   CREATININE 0.89 07/24/2016   BUN 34 (H) 07/24/2016   NA 137 07/24/2016   K 3.8 07/24/2016   CL 99 07/24/2016   CO2 26 07/24/2016     Jenkins Rouge

## 2016-11-16 ENCOUNTER — Ambulatory Visit (INDEPENDENT_AMBULATORY_CARE_PROVIDER_SITE_OTHER): Payer: Medicare HMO | Admitting: Cardiovascular Disease

## 2016-11-16 ENCOUNTER — Encounter (INDEPENDENT_AMBULATORY_CARE_PROVIDER_SITE_OTHER): Payer: Self-pay

## 2016-11-16 ENCOUNTER — Encounter: Payer: Self-pay | Admitting: Cardiovascular Disease

## 2016-11-16 VITALS — BP 120/60 | HR 75 | Ht 62.5 in | Wt 247.1 lb

## 2016-11-16 DIAGNOSIS — I429 Cardiomyopathy, unspecified: Secondary | ICD-10-CM

## 2016-11-16 DIAGNOSIS — I10 Essential (primary) hypertension: Secondary | ICD-10-CM | POA: Diagnosis not present

## 2016-11-16 NOTE — Patient Instructions (Signed)

## 2016-11-26 ENCOUNTER — Ambulatory Visit: Payer: Medicare HMO

## 2016-11-26 ENCOUNTER — Encounter: Payer: Self-pay | Admitting: Family Medicine

## 2016-11-26 ENCOUNTER — Ambulatory Visit (INDEPENDENT_AMBULATORY_CARE_PROVIDER_SITE_OTHER): Payer: Medicare HMO | Admitting: Family Medicine

## 2016-11-26 ENCOUNTER — Ambulatory Visit: Payer: Commercial Managed Care - HMO | Admitting: Family Medicine

## 2016-11-26 VITALS — BP 112/59 | HR 78 | Wt 251.0 lb

## 2016-11-26 DIAGNOSIS — I482 Chronic atrial fibrillation, unspecified: Secondary | ICD-10-CM

## 2016-11-26 DIAGNOSIS — E669 Obesity, unspecified: Secondary | ICD-10-CM | POA: Diagnosis not present

## 2016-11-26 DIAGNOSIS — I10 Essential (primary) hypertension: Secondary | ICD-10-CM | POA: Diagnosis not present

## 2016-11-26 DIAGNOSIS — E1169 Type 2 diabetes mellitus with other specified complication: Secondary | ICD-10-CM

## 2016-11-26 LAB — POCT GLYCOSYLATED HEMOGLOBIN (HGB A1C): Hemoglobin A1C: 5.7

## 2016-11-26 NOTE — Progress Notes (Signed)
Subjective:    CC: HTN, DM 4 mo f/U   Hypertension- Pt denies chest pain, SOB, dizziness, or heart palpitations.  Taking meds as directed w/o problems.  Denies medication side effects.    Diabetes - no hypoglycemic events. No wounds or sores that are not healing well. No increased thirst or urination. Checking glucose at home. Taking medications as prescribed without any side effects. Home sugars running 100-122.  He did want to let me know that she is weaning off of her statin because she went to see her chiropractor who specializes in diet and nutrition and he and his looked discussed that statins can cause cancer and so she wants to get off of them she is now just taking one every other day.  Atrial fibrillation - Stable. No SOB.  Has been working on upper body exercises.  She is on Xarelto.   She's been getting more weakness in her legs and says that the chiropractor that she is working with his encouraged her to work on her upper body strength and to stop walking for exercise.  Past medical history, Surgical history, Family history not pertinant except as noted below, Social history, Allergies, and medications have been entered into the medical record, reviewed, and corrections made.   Review of Systems: No fevers, chills, night sweats, weight loss, chest pain, or shortness of breath.   Objective:    General: Well Developed, well nourished, and in no acute distress.  Neuro: Alert and oriented x3, extra-ocular muscles intact, sensation grossly intact.  HEENT: Normocephalic, atraumatic  Skin: Warm and dry, no rashes. Cardiac: Regular rate and rhythm, no murmurs rubs or gallops, no lower extremity edema.  Respiratory: Clear to auscultation bilaterally. Not using accessory muscles, speaking in full sentences.   Impression and Recommendations:    HTN - She is really done a great job losing weight over the last year. There her weight is up a couple pounds from last time. Encouraged her to  just continue to work on healthy diet and regular exercise and weight loss and hopefully we can actually decrease her losartan 50 mg when I see her back in 3 months.  DM - Well controlled. Continue current regimen. Follow up in  6 months. We'll be due for full labs including lipid panel at follow-up appointment. I discussed with her that I am not aware of any study that shows a correlation between increased risk of cancer and taking a statin and discussed with her the benefits of taking a statin [facility] and she is diabetic and has hypertension. Certainly accept her what she decides. She's been noticing some lower extremity weakness and thinks it could be related to the statin. We also discussed making sure that she is working on some lower extremity strengthening.  Atrial fibrillation - stable. Continue Xarelto.

## 2016-11-26 NOTE — Patient Instructions (Signed)
We can look at splitting your losartan in half if pressure still look really good next time.

## 2016-11-30 ENCOUNTER — Other Ambulatory Visit: Payer: Self-pay | Admitting: Cardiovascular Disease

## 2017-01-21 NOTE — Patient Outreach (Signed)
[facility]) Care Management  01/21/2017  Charlotte [name] 21-Aug-1936 [id number]  Late Entry note:  Case closure was completed 03-25-16.  [name], RN, MSN RN [employer] [phone] Fax [phone]

## 2017-01-26 ENCOUNTER — Other Ambulatory Visit: Payer: Self-pay | Admitting: Cardiovascular Disease

## 2017-02-05 ENCOUNTER — Other Ambulatory Visit: Payer: Self-pay | Admitting: Family Medicine

## 2017-02-25 ENCOUNTER — Ambulatory Visit (INDEPENDENT_AMBULATORY_CARE_PROVIDER_SITE_OTHER): Payer: Medicare HMO | Admitting: Family Medicine

## 2017-02-25 ENCOUNTER — Encounter: Payer: Self-pay | Admitting: Family Medicine

## 2017-02-25 VITALS — BP 118/68 | HR 67 | Ht 63.0 in | Wt 248.0 lb

## 2017-02-25 DIAGNOSIS — E1169 Type 2 diabetes mellitus with other specified complication: Secondary | ICD-10-CM | POA: Diagnosis not present

## 2017-02-25 DIAGNOSIS — E876 Hypokalemia: Secondary | ICD-10-CM | POA: Diagnosis not present

## 2017-02-25 DIAGNOSIS — I10 Essential (primary) hypertension: Secondary | ICD-10-CM

## 2017-02-25 DIAGNOSIS — R7989 Other specified abnormal findings of blood chemistry: Secondary | ICD-10-CM | POA: Diagnosis not present

## 2017-02-25 DIAGNOSIS — E669 Obesity, unspecified: Secondary | ICD-10-CM

## 2017-02-25 DIAGNOSIS — R0602 Shortness of breath: Secondary | ICD-10-CM | POA: Diagnosis not present

## 2017-02-25 DIAGNOSIS — Z23 Encounter for immunization: Secondary | ICD-10-CM | POA: Diagnosis not present

## 2017-02-25 DIAGNOSIS — K5901 Slow transit constipation: Secondary | ICD-10-CM | POA: Diagnosis not present

## 2017-02-25 LAB — POCT UA - MICROALBUMIN
Albumin/Creatinine Ratio, Urine, POC: <30
Creatinine, POC: 100 mg/dL
Microalbumin Ur, POC: 30 mg/L

## 2017-02-25 LAB — POCT GLYCOSYLATED HEMOGLOBIN (HGB A1C): HEMOGLOBIN A1C: 5.6

## 2017-02-25 NOTE — Progress Notes (Signed)
Subjective:    Patient ID: Charlotte [name], female    DOB: [birth year], 81 y.o.   MRN: [id number]  HPI Hypertension- Pt denies chest pain, SOB, dizziness, or heart palpitations.  Taking meds as directed w/o problems.  Denies medication side effects.     Diabetes - no hypoglycemic events. No wounds or sores that are not healing well. No increased thirst or urination. Checking glucose at home. Taking medications as prescribed without any side effects.  Chronic constipation-she actually says her constipation has been better since she has stopped the tramadol. She says that has made a big difference in she's not having to use laxatives nearly as often.  Lower extremities edema-she feels like her swelling has been under good control.   Review of Systems BP 118/68   Pulse 67   Ht _0  (1.6 m)   Wt 248 lb (112.5 kg)   SpO2 97%   BMI 43.93 kg/m     Allergies  Allergen Reactions  . Amlodipine Besylate-Valsartan     HEADACHE     Past Medical History:  Diagnosis Date  . Aortic insufficiency   . Atrial fibrillation ([place])    a. s/p DCCV 03/2013  . CAD (coronary artery disease)    LHC 06/2010: EF 55%, mild plaque in the LAD 20-30%, otherwise normal coronary arteries  . CATARACTS   . Chronic systolic heart failure ([place])    in setting of AFib;  Echocardiogram 02/2013: Moderate LVH, EF 25-30%, anteroseptal and apical HK, moderate AI, MAC, moderate MR, moderate LAE, mild RAE, PASP 32 => after DCCV f/u Echo 8/14: Moderate LVH, EF 60-65%, normal wall motion, grade 1 diastolic dysfunction, moderate AI, moderate LAE  . Complication of anesthesia 2009   nausea and vomitting  . Diabetes mellitus without complication ([place])   . GERD (gastroesophageal reflux disease)   . HYPERLIPIDEMIA, MILD   . HYPERTENSION, BENIGN SYSTEMIC   . Irritable bowel syndrome   . LBBB (left bundle branch block)   . MEDIAL EPICONDYLITIS   . NSVT (nonsustained ventricular tachycardia) ([place])    during Dob Echo 2011 =>  normal cath  . OSTEOARTHRITIS   . OSTEOPENIA   . RHINITIS, ALLERGIC   . SYNCOPE   . VITAMIN D DEFICIENCY     Past Surgical History:  Procedure Laterality Date  . CARDIOVERSION N/A 04/01/2013   Procedure: CARDIOVERSION;  Surgeon: Josue Hector, MD;  Location: [place];  Service: Cardiovascular;  Laterality: N/A;  . CATARACT EXTRACTION, BILATERAL  2012   Dr. Herbert Deaner  . RADICAL HYSTERECTOMY    . REPLACEMENT TOTAL KNEE     11/03/07 right....... 03/29/08 left    Social History   Social History  . Marital status: Divorced    Spouse name: N/A  . Number of children: 1  . Years of education: N/A   Occupational History  . Retired Retired   Social History Main Topics  . Smoking status: Former Smoker    Quit date: 10/08/1970  . Smokeless tobacco: Never Used  . Alcohol use No  . Drug use: No  . Sexual activity: Not on file   Other Topics Concern  . Not on file   Social History Narrative  . No narrative on file    Family History  Problem Relation Age of Onset  . Arrhythmia Mother        afib  . Alzheimer's disease Mother   . Colon cancer Father   . Heart attack Father 45  . Diabetes Sister   .  Arrhythmia Sister        afib    Outpatient Encounter Prescriptions as of 02/25/2017  Medication Sig  . acetaminophen (TYLENOL) 650 MG CR tablet Take 2 tablets (1,300 mg total) by mouth every 8 (eight) hours as needed for pain.  . Calcium Carb-Cholecalciferol (CALCIUM 600/VITAMIN D3) 600-800 MG-UNIT TABS Take 1 tablet by mouth 2 (two) times daily.  . carvedilol (COREG) 6.25 MG tablet Take 6.25 mg by mouth 2 (two) times daily.  . furosemide (LASIX) 20 MG tablet TAKE 1 TAB BY MOUTH IN THE AM & 2 TABS BY MOUTH IN THE PM  . gabapentin (NEURONTIN) 300 MG capsule TAKE 1 CAPSULE FOUR TIMES DAILY  . losartan (COZAAR) 100 MG tablet Take 100 mg by mouth daily.  . metFORMIN (GLUCOPHAGE) 500 MG tablet Take 500 mg by mouth 2 (two) times daily.  . metolazone (ZAROXOLYN) 2.5 MG tablet TAKE 1  TABLET EVERY OTHER DAY  . Multiple Vitamin (MULTIVITAMIN) capsule Take 1 capsule by mouth daily.    . polyvinyl alcohol-povidone (REFRESH) 1.4-0.6 % ophthalmic solution Place 1-2 drops into both eyes daily as needed (FOR DRY EYES).   . potassium chloride (K-DUR,KLOR-CON) 10 MEQ tablet Take 1 tablet (10 mEq total) by mouth 2 (two) times daily.  . rivaroxaban (XARELTO) 20 MG TABS tablet Take 1 tablet (20 mg total) by mouth daily with supper.  . verapamil (VERELAN PM) 240 MG 24 hr capsule Take 1 capsule (240 mg total) by mouth at bedtime.  . vitamin B-12 (CYANOCOBALAMIN) 1000 MCG tablet Take 1,000 mcg by mouth daily.    . [DISCONTINUED] potassium chloride (MICRO-K) 10 MEQ CR capsule Take 10 mEq by mouth 2 (two) times daily.  . [DISCONTINUED] traMADol (ULTRAM) 50 MG tablet Take 50 mg by mouth every 6 (six) hours as needed.   No facility-administered encounter medications on file as of 02/25/2017.          Objective:   Physical Exam  Constitutional: She is oriented to person, place, and time. She appears well-developed and well-nourished.  HENT:  Head: Normocephalic and atraumatic.  Cardiovascular: Normal rate, regular rhythm and normal heart sounds.   Pulmonary/Chest: Effort normal and breath sounds normal.  Musculoskeletal:  Trace ankle edema bilaterally.  Neurological: She is alert and oriented to person, place, and time.  Skin: Skin is warm and dry.  Psychiatric: She has a normal mood and affect. Her behavior is normal.          Assessment & Plan:  HTN - Well controlled. Due to recheck CMP and lipids. Continue current regimen. Follow up in  6 months.   DM- Well-controlled. Hemoglobin A1c of 5.6 which is fantastic. Foot exam performed. Follow-up in 4-5 months.  Continue metformin. Lab Results  Component Value Date   HGBA1C 5.7 11/26/2016   Chronic constipation - improved off of the tramadol. She is mostly relying on her gabapentin and Tylenol for pain control at this point in  time.   Edema-stable on current regimen. Due to recheck electrolytes.

## 2017-03-01 DIAGNOSIS — I10 Essential (primary) hypertension: Secondary | ICD-10-CM | POA: Diagnosis not present

## 2017-03-01 DIAGNOSIS — E669 Obesity, unspecified: Secondary | ICD-10-CM | POA: Diagnosis not present

## 2017-03-01 DIAGNOSIS — E1169 Type 2 diabetes mellitus with other specified complication: Secondary | ICD-10-CM | POA: Diagnosis not present

## 2017-03-02 LAB — COMPLETE METABOLIC PANEL WITH GFR
ALK PHOS: 73 U/L (ref 33–130)
ALT: 11 U/L (ref 6–29)
AST: 13 U/L (ref 10–35)
Albumin: 3.7 g/dL (ref 3.6–5.1)
BILIRUBIN TOTAL: 0.5 mg/dL (ref 0.2–1.2)
BUN: 51 mg/dL — AB (ref 7–25)
CALCIUM: 9.5 mg/dL (ref 8.6–10.4)
CO2: 24 mmol/L (ref 20–31)
Chloride: 98 mmol/L (ref 98–110)
Creat: 1.19 mg/dL — ABNORMAL HIGH (ref 0.60–0.93)
GFR, Est African American: 50 mL/min — ABNORMAL LOW (ref >=60)
GFR, Est Non African American: 44 mL/min — ABNORMAL LOW (ref >=60)
GLUCOSE: 111 mg/dL — AB (ref 65–99)
Potassium: 3.4 mmol/L — ABNORMAL LOW (ref 3.5–5.3)
SODIUM: 135 mmol/L (ref 135–146)
TOTAL PROTEIN: 6.8 g/dL (ref 6.1–8.1)

## 2017-03-02 LAB — LIPID PANEL
CHOLESTEROL: 193 mg/dL (ref <200)
HDL: 54 mg/dL (ref >50)
LDL Cholesterol: 124 mg/dL — ABNORMAL HIGH (ref <100)
Total CHOL/HDL Ratio: 3.6 Ratio (ref <5.0)
Triglycerides: 76 mg/dL (ref <150)
VLDL: 15 mg/dL (ref <30)

## 2017-03-02 LAB — TSH: TSH: 1.38 m[IU]/L

## 2017-03-06 NOTE — Addendum Note (Signed)
Addended by: Deno Etienne on: 03/06/2017 10:27 AM   Modules accepted: Orders

## 2017-03-07 ENCOUNTER — Other Ambulatory Visit: Payer: Self-pay | Admitting: Cardiovascular Disease

## 2017-03-12 DIAGNOSIS — R7989 Other specified abnormal findings of blood chemistry: Secondary | ICD-10-CM | POA: Diagnosis not present

## 2017-03-12 DIAGNOSIS — E876 Hypokalemia: Secondary | ICD-10-CM | POA: Diagnosis not present

## 2017-03-13 LAB — BASIC METABOLIC PANEL
BUN: 25 mg/dL (ref 7–25)
CO2: 22 mmol/L (ref 20–31)
Calcium: 8.9 mg/dL (ref 8.6–10.4)
Chloride: 101 mmol/L (ref 98–110)
Creat: 0.94 mg/dL — ABNORMAL HIGH (ref 0.60–0.93)
Glucose, Bld: 108 mg/dL — ABNORMAL HIGH (ref 65–99)
POTASSIUM: 3.7 mmol/L (ref 3.5–5.3)
SODIUM: 135 mmol/L (ref 135–146)

## 2017-03-26 ENCOUNTER — Other Ambulatory Visit: Payer: Self-pay | Admitting: Cardiovascular Disease

## 2017-05-02 ENCOUNTER — Telehealth: Payer: Self-pay | Admitting: Cardiovascular Disease

## 2017-05-02 NOTE — Telephone Encounter (Signed)
New Message     This call came off the answering service sheet   Pt  is dehydrated and has burning when urinating - how long can pt go w/o taking metolazone (ZAROXOLYN) 2.5 MG tablet and furosemide (LASIX) 20 MG tablet

## 2017-05-02 NOTE — Telephone Encounter (Signed)
Patient stated her Chiropractor tried to do a procedure PRP injections. They were unable to do the procedure due to patient being dehydrated. Patient wanted to know what she could do since she takings Lasix and Metolazone. Informed patient she should not stop any of her medications without talking to her doctor. Informed patient that a lot of times places that need patients to stop medications will send a request to the patient's doctor. Encouraged patient if she needs to have this procedure done to have it scheduled early in the morning and just wait to take her medications after the procedure is done that morning.  Informed patient that her PCP checked her BMET last month, and it had improved from the previous month. Patient also complained of being dizzy when standing and getting up at night to use the bathroom. Patient stated her BP and HR are good. Encouraged patient to change positions slowly and take her time when getting up at night. Informed patient that Dr. Eden Emms is out of the office until next week. Will forward to patient's PCP and Dr. Eden Emms to advise.

## 2017-05-03 NOTE — Telephone Encounter (Signed)
I agree, Ok to hold duretic until after procedure, especially if she can have the procedure done first thing in the morning and then take her medications when she gets home. If that is not possible then she could hold the Lasix for a day if needed.

## 2017-05-03 NOTE — Telephone Encounter (Signed)
Called patient back with Dr. Shelah Lewandowsky recommendation. Patient verbalized understanding and appreciated the call back.

## 2017-05-06 ENCOUNTER — Telehealth: Payer: Self-pay | Admitting: Cardiovascular Disease

## 2017-05-06 NOTE — Telephone Encounter (Signed)
New message    Molli Hazard is calling. He states pt is on lasix and is dehydrated. He is asking if pt can hold lasix for 24 hours prior to her next visit. He said she is scheduled for this Wednesday. Please call pt.

## 2017-05-06 NOTE — Telephone Encounter (Signed)
Left message for patient to call back  

## 2017-05-07 NOTE — Telephone Encounter (Signed)
Agapito Games, MD  to Me     10:23 AM  Note    I agree, Ok to hold duretic until after procedure, especially if she can have the procedure done first thing in the morning and then take her medications when she gets home. If that is not possible then she could hold the Lasix for a day if needed.     Molli Hazard returned call this morning. Repeated Dr. Shelah Lewandowsky message from last phone note to Molli Hazard at [facility]. He verbalized understanding and stated "That is all I needed to know".

## 2017-05-13 ENCOUNTER — Other Ambulatory Visit: Payer: Self-pay | Admitting: Cardiovascular Disease

## 2017-05-13 ENCOUNTER — Other Ambulatory Visit: Payer: Self-pay | Admitting: Family Medicine

## 2017-05-21 ENCOUNTER — Encounter (HOSPITAL_COMMUNITY): Payer: Self-pay

## 2017-05-21 ENCOUNTER — Encounter (INDEPENDENT_AMBULATORY_CARE_PROVIDER_SITE_OTHER): Payer: Self-pay

## 2017-05-21 ENCOUNTER — Inpatient Hospital Stay (HOSPITAL_COMMUNITY)
Admission: EM | Admit: 2017-05-21 | Discharge: 2017-05-23 | DRG: 811 | Disposition: A | Discharge status: Home / Self Care | Payer: Medicare Other | Attending: Internal Medicine | Admitting: Internal Medicine

## 2017-05-21 ENCOUNTER — Encounter: Payer: Self-pay | Admitting: Nurse Practitioner

## 2017-05-21 ENCOUNTER — Ambulatory Visit (INDEPENDENT_AMBULATORY_CARE_PROVIDER_SITE_OTHER): Payer: Medicare HMO | Admitting: Nurse Practitioner

## 2017-05-21 VITALS — BP 128/60 | HR 88 | Ht 62.5 in | Wt 248.2 lb

## 2017-05-21 DIAGNOSIS — E861 Hypovolemia: Secondary | ICD-10-CM | POA: Diagnosis present

## 2017-05-21 DIAGNOSIS — I4891 Unspecified atrial fibrillation: Secondary | ICD-10-CM | POA: Diagnosis present

## 2017-05-21 DIAGNOSIS — K219 Gastro-esophageal reflux disease without esophagitis: Secondary | ICD-10-CM | POA: Diagnosis present

## 2017-05-21 DIAGNOSIS — M19071 Primary osteoarthritis, right ankle and foot: Secondary | ICD-10-CM

## 2017-05-21 DIAGNOSIS — Z888 Allergy status to other drugs, medicaments and biological substances status: Secondary | ICD-10-CM | POA: Diagnosis not present

## 2017-05-21 DIAGNOSIS — E119 Type 2 diabetes mellitus without complications: Secondary | ICD-10-CM | POA: Diagnosis not present

## 2017-05-21 DIAGNOSIS — R195 Other fecal abnormalities: Secondary | ICD-10-CM | POA: Diagnosis not present

## 2017-05-21 DIAGNOSIS — Z86010 Personal history of colonic polyps: Secondary | ICD-10-CM

## 2017-05-21 DIAGNOSIS — E1169 Type 2 diabetes mellitus with other specified complication: Secondary | ICD-10-CM | POA: Diagnosis not present

## 2017-05-21 DIAGNOSIS — E669 Obesity, unspecified: Secondary | ICD-10-CM | POA: Diagnosis present

## 2017-05-21 DIAGNOSIS — E871 Hypo-osmolality and hyponatremia: Secondary | ICD-10-CM | POA: Diagnosis not present

## 2017-05-21 DIAGNOSIS — K2100 Gastro-esophageal reflux disease with esophagitis, without bleeding: Secondary | ICD-10-CM

## 2017-05-21 DIAGNOSIS — Z6841 Body Mass Index (BMI) 40.0 and over, adult: Secondary | ICD-10-CM | POA: Diagnosis not present

## 2017-05-21 DIAGNOSIS — I10 Essential (primary) hypertension: Secondary | ICD-10-CM | POA: Diagnosis present

## 2017-05-21 DIAGNOSIS — I5032 Chronic diastolic (congestive) heart failure: Secondary | ICD-10-CM | POA: Diagnosis present

## 2017-05-21 DIAGNOSIS — K254 Chronic or unspecified gastric ulcer with hemorrhage: Secondary | ICD-10-CM | POA: Diagnosis present

## 2017-05-21 DIAGNOSIS — Z7901 Long term (current) use of anticoagulants: Secondary | ICD-10-CM

## 2017-05-21 DIAGNOSIS — Z9841 Cataract extraction status, right eye: Secondary | ICD-10-CM | POA: Diagnosis not present

## 2017-05-21 DIAGNOSIS — I447 Left bundle-branch block, unspecified: Secondary | ICD-10-CM | POA: Diagnosis present

## 2017-05-21 DIAGNOSIS — K5730 Diverticulosis of large intestine without perforation or abscess without bleeding: Secondary | ICD-10-CM | POA: Diagnosis not present

## 2017-05-21 DIAGNOSIS — M19072 Primary osteoarthritis, left ankle and foot: Secondary | ICD-10-CM

## 2017-05-21 DIAGNOSIS — I351 Nonrheumatic aortic (valve) insufficiency: Secondary | ICD-10-CM | POA: Diagnosis present

## 2017-05-21 DIAGNOSIS — I480 Paroxysmal atrial fibrillation: Secondary | ICD-10-CM | POA: Diagnosis not present

## 2017-05-21 DIAGNOSIS — D62 Acute posthemorrhagic anemia: Secondary | ICD-10-CM | POA: Diagnosis not present

## 2017-05-21 DIAGNOSIS — I440 Atrioventricular block, first degree: Secondary | ICD-10-CM | POA: Diagnosis present

## 2017-05-21 DIAGNOSIS — Z96653 Presence of artificial knee joint, bilateral: Secondary | ICD-10-CM | POA: Diagnosis not present

## 2017-05-21 DIAGNOSIS — I428 Other cardiomyopathies: Secondary | ICD-10-CM

## 2017-05-21 DIAGNOSIS — D649 Anemia, unspecified: Secondary | ICD-10-CM | POA: Diagnosis not present

## 2017-05-21 DIAGNOSIS — Z800 Family history of malignant neoplasm of digestive organs: Secondary | ICD-10-CM | POA: Diagnosis not present

## 2017-05-21 DIAGNOSIS — Z833 Family history of diabetes mellitus: Secondary | ICD-10-CM

## 2017-05-21 DIAGNOSIS — Z87891 Personal history of nicotine dependence: Secondary | ICD-10-CM

## 2017-05-21 DIAGNOSIS — Z9842 Cataract extraction status, left eye: Secondary | ICD-10-CM

## 2017-05-21 DIAGNOSIS — E876 Hypokalemia: Secondary | ICD-10-CM | POA: Diagnosis not present

## 2017-05-21 DIAGNOSIS — Z90710 Acquired absence of both cervix and uterus: Secondary | ICD-10-CM

## 2017-05-21 DIAGNOSIS — K922 Gastrointestinal hemorrhage, unspecified: Secondary | ICD-10-CM | POA: Diagnosis not present

## 2017-05-21 DIAGNOSIS — I110 Hypertensive heart disease with heart failure: Secondary | ICD-10-CM | POA: Diagnosis present

## 2017-05-21 DIAGNOSIS — I2510 Atherosclerotic heart disease of native coronary artery without angina pectoris: Secondary | ICD-10-CM | POA: Diagnosis present

## 2017-05-21 DIAGNOSIS — Z7984 Long term (current) use of oral hypoglycemic drugs: Secondary | ICD-10-CM

## 2017-05-21 DIAGNOSIS — K259 Gastric ulcer, unspecified as acute or chronic, without hemorrhage or perforation: Secondary | ICD-10-CM | POA: Diagnosis not present

## 2017-05-21 DIAGNOSIS — Z8249 Family history of ischemic heart disease and other diseases of the circulatory system: Secondary | ICD-10-CM

## 2017-05-21 DIAGNOSIS — D509 Iron deficiency anemia, unspecified: Secondary | ICD-10-CM | POA: Diagnosis not present

## 2017-05-21 LAB — CBC
HCT: 24 % — ABNORMAL LOW (ref 36.0–46.0)
Hematocrit: 23.2 % — ABNORMAL LOW (ref 34.0–46.6)
Hemoglobin: 7.4 g/dL — ABNORMAL LOW (ref 11.1–15.9)
Hemoglobin: 7.3 g/dL — ABNORMAL LOW (ref 12.0–15.0)
MCH: 22.5 pg — ABNORMAL LOW (ref 26.6–33.0)
MCH: 22.6 pg — AB (ref 26.0–34.0)
MCHC: 31.9 g/dL (ref 31.5–35.7)
MCHC: 30.4 g/dL (ref 30.0–36.0)
MCV: 71 fL — ABNORMAL LOW (ref 79–97)
MCV: 74.3 fL — ABNORMAL LOW (ref 78.0–100.0)
PLATELETS: 415 10*3/uL — AB (ref 150–400)
Platelets: 440 10*3/uL — ABNORMAL HIGH (ref 150–379)
RBC: 3.29 x10E6/uL — ABNORMAL LOW (ref 3.77–5.28)
RBC: 3.23 MIL/uL — ABNORMAL LOW (ref 3.87–5.11)
RDW: 17 % — ABNORMAL HIGH (ref 12.3–15.4)
RDW: 16.4 % — AB (ref 11.5–15.5)
WBC: 7 10*3/uL (ref 3.4–10.8)
WBC: 7.5 10*3/uL (ref 4.0–10.5)

## 2017-05-21 LAB — BASIC METABOLIC PANEL
Anion gap: 12 (ref 5–15)
BUN/Creatinine Ratio: 27 (ref 12–28)
BUN: 28 mg/dL — ABNORMAL HIGH (ref 8–27)
BUN: 29 mg/dL — AB (ref 6–20)
CALCIUM: 9.1 mg/dL (ref 8.9–10.3)
CO2: 23 mmol/L (ref 20–29)
CO2: 23 mmol/L (ref 22–32)
CREATININE: 1.07 mg/dL — AB (ref 0.44–1.00)
Calcium: 9.4 mg/dL (ref 8.7–10.3)
Chloride: 98 mmol/L (ref 96–106)
Chloride: 98 mmol/L — ABNORMAL LOW (ref 101–111)
Creatinine, Ser: 1.05 mg/dL — ABNORMAL HIGH (ref 0.57–1.00)
GFR calc Af Amer: 58 mL/min/{1.73_m2} — ABNORMAL LOW (ref >59)
GFR calc Af Amer: 56 mL/min — ABNORMAL LOW (ref >60)
GFR calc non Af Amer: 51 mL/min/{1.73_m2} — ABNORMAL LOW (ref >59)
GFR calc non Af Amer: 48 mL/min — ABNORMAL LOW (ref >60)
GLUCOSE: 99 mg/dL (ref 65–99)
Glucose: 108 mg/dL — ABNORMAL HIGH (ref 65–99)
Potassium: 3.4 mmol/L — ABNORMAL LOW (ref 3.5–5.2)
Potassium: 3.6 mmol/L (ref 3.5–5.1)
Sodium: 136 mmol/L (ref 134–144)
Sodium: 133 mmol/L — ABNORMAL LOW (ref 135–145)

## 2017-05-21 LAB — ABO/RH: ABO/RH(D): A POS

## 2017-05-21 LAB — POC OCCULT BLOOD, ED: FECAL OCCULT BLD: POSITIVE — AB

## 2017-05-21 LAB — PREPARE RBC (CROSSMATCH)

## 2017-05-21 MED ORDER — ADULT MULTIVITAMIN W/MINERALS CH
1.0000 | ORAL_TABLET | Freq: Every day | ORAL | Status: DC
  Administered 2017-05-22 – 2017-05-23 (×2): 1 via ORAL
  Filled 2017-05-21 (×2): qty 1

## 2017-05-21 MED ORDER — LOSARTAN POTASSIUM 50 MG PO TABS
100.0000 mg | ORAL_TABLET | Freq: Every day | ORAL | Status: DC
  Administered 2017-05-22 – 2017-05-23 (×2): 100 mg via ORAL
  Filled 2017-05-21 (×2): qty 2

## 2017-05-21 MED ORDER — POLYVINYL ALCOHOL 1.4 % OP SOLN
1.0000 [drp] | Freq: Every day | OPHTHALMIC | Status: DC | PRN
  Filled 2017-05-21: qty 15

## 2017-05-21 MED ORDER — PANTOPRAZOLE SODIUM 40 MG IV SOLR
40.0000 mg | Freq: Two times a day (BID) | INTRAVENOUS | Status: DC
  Administered 2017-05-22 – 2017-05-23 (×4): 40 mg via INTRAVENOUS
  Filled 2017-05-21 (×4): qty 40

## 2017-05-21 MED ORDER — HYDROCODONE-ACETAMINOPHEN 5-325 MG PO TABS
1.0000 | ORAL_TABLET | ORAL | Status: DC | PRN
  Administered 2017-05-22 (×2): 1 via ORAL
  Filled 2017-05-21: qty 1
  Filled 2017-05-21: qty 2

## 2017-05-21 MED ORDER — CARVEDILOL 6.25 MG PO TABS
6.2500 mg | ORAL_TABLET | Freq: Two times a day (BID) | ORAL | Status: DC
  Administered 2017-05-22 – 2017-05-23 (×3): 6.25 mg via ORAL
  Filled 2017-05-21 (×4): qty 1

## 2017-05-21 MED ORDER — VERAPAMIL HCL ER 240 MG PO TBCR
240.0000 mg | EXTENDED_RELEASE_TABLET | Freq: Every day | ORAL | Status: DC
  Administered 2017-05-22 (×2): 240 mg via ORAL
  Filled 2017-05-21 (×4): qty 1

## 2017-05-21 MED ORDER — INSULIN ASPART 100 UNIT/ML ~~LOC~~ SOLN: 0.0000 [IU] | Freq: Every day | SUBCUTANEOUS | Status: DC

## 2017-05-21 MED ORDER — SODIUM CHLORIDE 0.9% FLUSH
3.0000 mL | Freq: Two times a day (BID) | INTRAVENOUS | Status: DC
  Administered 2017-05-22: 3 mL via INTRAVENOUS

## 2017-05-21 MED ORDER — SODIUM CHLORIDE 0.9% FLUSH
3.0000 mL | Freq: Two times a day (BID) | INTRAVENOUS | Status: DC
  Administered 2017-05-22 – 2017-05-23 (×2): 3 mL via INTRAVENOUS

## 2017-05-21 MED ORDER — ACETAMINOPHEN 650 MG RE SUPP: 650.0000 mg | Freq: Four times a day (QID) | RECTAL | Status: DC | PRN

## 2017-05-21 MED ORDER — VITAMIN B-12 1000 MCG PO TABS
1000.0000 ug | ORAL_TABLET | Freq: Every day | ORAL | Status: DC
  Administered 2017-05-22 – 2017-05-23 (×2): 1000 ug via ORAL
  Filled 2017-05-21 (×2): qty 1

## 2017-05-21 MED ORDER — INSULIN ASPART 100 UNIT/ML ~~LOC~~ SOLN: 0.0000 [IU] | Freq: Three times a day (TID) | SUBCUTANEOUS | Status: DC

## 2017-05-21 MED ORDER — SODIUM CHLORIDE 0.9 % IV SOLN
10.0000 mL/h | Freq: Once | INTRAVENOUS | Status: AC
  Administered 2017-05-23: 09:00:00 via INTRAVENOUS

## 2017-05-21 MED ORDER — SODIUM CHLORIDE 0.9% FLUSH: 3.0000 mL | INTRAVENOUS | Status: DC | PRN

## 2017-05-21 MED ORDER — GABAPENTIN 300 MG PO CAPS
300.0000 mg | ORAL_CAPSULE | Freq: Three times a day (TID) | ORAL | Status: DC
  Administered 2017-05-22 – 2017-05-23 (×6): 300 mg via ORAL
  Filled 2017-05-21 (×6): qty 1

## 2017-05-21 MED ORDER — ONDANSETRON HCL 4 MG PO TABS: 4.0000 mg | ORAL_TABLET | Freq: Four times a day (QID) | ORAL | Status: DC | PRN

## 2017-05-21 MED ORDER — ACETAMINOPHEN 325 MG PO TABS
650.0000 mg | ORAL_TABLET | Freq: Four times a day (QID) | ORAL | Status: DC | PRN
  Administered 2017-05-22 – 2017-05-23 (×3): 650 mg via ORAL
  Filled 2017-05-21 (×3): qty 2

## 2017-05-21 MED ORDER — ONDANSETRON HCL 4 MG/2ML IJ SOLN: 4.0000 mg | Freq: Four times a day (QID) | INTRAMUSCULAR | Status: DC | PRN

## 2017-05-21 MED ORDER — CALCIUM CARBONATE-VITAMIN D 500-200 MG-UNIT PO TABS
1.0000 | ORAL_TABLET | Freq: Two times a day (BID) | ORAL | Status: DC
Start: 2017-05-22 — End: 2017-05-23
  Administered 2017-05-22 – 2017-05-23 (×3): 1 via ORAL
  Filled 2017-05-21 (×3): qty 1

## 2017-05-21 MED ORDER — SODIUM CHLORIDE 0.9 % IV SOLN: 250.0000 mL | INTRAVENOUS | Status: DC | PRN

## 2017-05-21 NOTE — ED Triage Notes (Signed)
Per Pt, Pt went to PCP for wellness check. Pt was called and told to come to the ED due to low hemoglobin. Pt reports fatigue, but denies any other symptoms. Pt has chronic back pain. Reports some SOB.

## 2017-05-21 NOTE — Progress Notes (Signed)
CARDIOLOGY OFFICE NOTE  Date:  05/21/2017    Charlotte [name] Date of Birth: 1936-09-13 Medical Record #[id number]  PCP:  [name], MD  Cardiologist:  Johnsie Cancel  Chief Complaint  Patient presents with  . Atrial Fibrillation    6 month check - seen for Dr. Johnsie Cancel    History of Present Illness: Charlotte [name] is a 81 y.o. female who presents today for a 6 month check. Seen for Dr. Johnsie Cancel.   She has a history of PAF, LBBB, HLD, & HTN. Dobutamine echocardiogram done in 06/2010 for syncope demonstrated nonsustained ventricular tachycardia. EF at that time was normal. LHC 06/2010: EF 55%, mild plaque in the LAD 20-30%, otherwise normal coronary arteries. Atrial fibrillation noted in 2014.  Echocardiogram 02/2013: Moderate LVH, EF 25-30%, anteroseptal and apical HK, moderate AI, MAC, moderate MR, moderate LAE, mild RAE, PASP 32. Patient was placed on Xarelto for anticoagulation. She underwent DCCV 04/01/13 after several weeks of uninterrupted treatment with Xarelto. She has maintained NSR. Other issues as noted below.   Last seen back in February - seemed to be doing ok. Remained in NSR.   Comes in today. Here alone. It is her regular follow up but she is complaining of feeling short of breath.  She notes that she has been to "[facility] [facility] [employer]" - had blood taken - respun and then injected back into her feet. She paid $3000. Says she can feel her feet after the first treatment. Seen by her PCP but told she was "dehydrated" - could not get her labs checked. No recent CBC noted. She feels short of breath. Has just started over the last week or so - only with exertion - especially with walking. No chest pain. Rhythm has been ok as far as she knows. No belly pain. Having constipation. Stool may be dark. She has chronic swelling in her legs - this is unchanged.   Past Medical History:  Diagnosis Date  . Aortic insufficiency   . Atrial fibrillation ([place])    a. s/p DCCV 03/2013  . CAD  (coronary artery disease)    LHC 06/2010: EF 55%, mild plaque in the LAD 20-30%, otherwise normal coronary arteries  . CATARACTS   . Chronic systolic heart failure ([place])    in setting of AFib;  Echocardiogram 02/2013: Moderate LVH, EF 25-30%, anteroseptal and apical HK, moderate AI, MAC, moderate MR, moderate LAE, mild RAE, PASP 32 => after DCCV f/u Echo 8/14: Moderate LVH, EF 60-65%, normal wall motion, grade 1 diastolic dysfunction, moderate AI, moderate LAE  . Complication of anesthesia 2009   nausea and vomitting  . Diabetes mellitus without complication ([place])   . GERD (gastroesophageal reflux disease)   . HYPERLIPIDEMIA, MILD   . HYPERTENSION, BENIGN SYSTEMIC   . Irritable bowel syndrome   . LBBB (left bundle branch block)   . MEDIAL EPICONDYLITIS   . NSVT (nonsustained ventricular tachycardia) ([place])    during Dob Echo 2011 => normal cath  . OSTEOARTHRITIS   . OSTEOPENIA   . RHINITIS, ALLERGIC   . SYNCOPE   . VITAMIN D DEFICIENCY     Past Surgical History:  Procedure Laterality Date  . CARDIOVERSION N/A 04/01/2013   Procedure: CARDIOVERSION;  Surgeon: Josue Hector, MD;  Location: [facility];  Service: Cardiovascular;  Laterality: N/A;  . CATARACT EXTRACTION, BILATERAL  2012   Dr. Herbert Deaner  . RADICAL HYSTERECTOMY    . REPLACEMENT TOTAL KNEE     11/03/07 right....... 03/29/08  left     Medications: Current Meds  Medication Sig  . acetaminophen (TYLENOL) 650 MG CR tablet Take 2 tablets (1,300 mg total) by mouth every 8 (eight) hours as needed for pain.  . Calcium Carb-Cholecalciferol (CALCIUM 600/VITAMIN D3) 600-800 MG-UNIT TABS Take 1 tablet by mouth 2 (two) times daily.  . carvedilol (COREG) 6.25 MG tablet TAKE 1 TABLET TWICE DAILY  . furosemide (LASIX) 20 MG tablet Take 20 mg by mouth 2 (two) times daily. Except when pt's has prp treatment  . gabapentin (NEURONTIN) 300 MG capsule TAKE 1 CAPSULE FOUR TIMES DAILY  . losartan (COZAAR) 100 MG tablet TAKE 1 TABLET EVERY DAY  .  metFORMIN (GLUCOPHAGE) 500 MG tablet TAKE 1 TABLET TWICE DAILY WITH MEALS  . metolazone (ZAROXOLYN) 2.5 MG tablet TAKE 1 TABLET EVERY OTHER DAY  . Multiple Vitamin (MULTIVITAMIN) capsule Take 1 capsule by mouth daily.    . polyvinyl alcohol-povidone (REFRESH) 1.4-0.6 % ophthalmic solution Place 1-2 drops into both eyes daily as needed (FOR DRY EYES).   . potassium chloride (K-DUR,KLOR-CON) 10 MEQ tablet Take 1 tablet (10 mEq total) by mouth 2 (two) times daily.  . verapamil (VERELAN PM) 240 MG 24 hr capsule TAKE 1 CAPSULE AT BEDTIME  . vitamin B-12 (CYANOCOBALAMIN) 1000 MCG tablet Take 1,000 mcg by mouth daily.    Alveda Reasons 20 MG TABS tablet TAKE 1 TABLET EVERY DAY WITH SUPPER  . [DISCONTINUED] furosemide (LASIX) 20 MG tablet TAKE 1 TAB BY MOUTH IN THE AM & 2 TABS BY MOUTH IN THE PM     Allergies: Allergies  Allergen Reactions  . Amlodipine Besylate-Valsartan     HEADACHE     Social History: The patient  reports that she quit smoking about 46 years ago. She has never used smokeless tobacco. She reports that she does not drink alcohol or use drugs.   Family History: The patient's family history includes Alzheimer's disease in her mother; Arrhythmia in her mother and sister; Colon cancer in her father; Diabetes in her sister; Heart attack (age of onset: 73) in her father.   Review of Systems: Please see the history of present illness.   Otherwise, the review of systems is positive for none.   All other systems are reviewed and negative.   Physical Exam: VS:  BP 128/60 (BP Location: Left Arm, Patient Position: Sitting, Cuff Size: Large)   Pulse 88   Ht 5' 2.5" (1.588 m)   Wt 248 lb 3.2 oz (112.6 kg)   BMI 44.67 kg/m  .  BMI Body mass index is 44.67 kg/m.  Wt Readings from Last 3 Encounters:  05/21/17 248 lb 3.2 oz (112.6 kg)  02/25/17 248 lb (112.5 kg)  11/26/16 251 lb (113.9 kg)   Oxygen level is 96%.   General: Pleasant. Obese female who is alert and in no acute distress.  Using a cane. Little short of breath with coming into the office. She is quite pale.    HEENT: Normal. Conjunctiva pale as well.  Neck: Supple, no JVD, carotid bruits, or masses noted.  Cardiac: Regular rate and rhythm. Occasional ectopics noted. No murmurs, rubs, or gallops. Legs are full with trace edema.  Respiratory:  Lungs are clear to auscultation bilaterally with normal work of breathing.  GI: Soft and nontender.  MS: No deformity or atrophy. Gait and ROM intact.  Skin: Warm and dry. Color is quite pale Neuro:  Strength and sensation are intact and no gross focal deficits noted.  Psych: Alert, appropriate  and with normal affect.   LABORATORY DATA:  EKG:  EKG is ordered today. This shows NSR with LBBB. PVC noted.   Lab Results  Component Value Date   WBC 6.0 03/27/2013   HGB 13.8 03/27/2013   HCT 41.0 03/27/2013   PLT 190.0 03/27/2013   GLUCOSE 108 (H) 03/12/2017   CHOL 193 03/01/2017   TRIG 76 03/01/2017   HDL 54 03/01/2017   LDLCALC 124 (H) 03/01/2017   ALT 11 03/01/2017   AST 13 03/01/2017   NA 135 03/12/2017   K 3.7 03/12/2017   CL 101 03/12/2017   CREATININE 0.94 (H) 03/12/2017   BUN 25 03/12/2017   CO2 22 03/12/2017   TSH 1.38 03/01/2017   INR 3.2 02/27/2016   HGBA1C 5.6 02/25/2017   MICROALBUR 30 02/25/2017     BNP (last 3 results) No results for input(s): BNP in the last 8760 hours.  ProBNP (last 3 results) No results for input(s): PROBNP in the last 8760 hours.   Other Studies Reviewed Today:  Echo Study Conclusions 02/2016  - Left ventricle: The cavity size was normal. There was mild focal   basal and mild concentric hypertrophy of the septum. Systolic   function was normal. The estimated ejection fraction was in the   range of 55% to 60%. Wall motion was normal; there were no   regional wall motion abnormalities. There was an increased   relative contribution of atrial contraction to ventricular   filling. Doppler parameters are consistent  with abnormal left   ventricular relaxation (grade 1 diastolic dysfunction). - Ventricular septum: Septal motion showed moderate paradox. These   changes are consistent with intraventricular conduction delay. - Aortic valve: Trileaflet; normal thickness, mildly calcified   leaflets. There was mild regurgitation. - Mitral valve: Calcified annulus. Mild focal calcification of the   anterior leaflet (medial segment(s)). - Atrial septum: There was increased thickness of the septum,   consistent with lipomatous hypertrophy. - Tricuspid valve: There was trivial regurgitation.  Assessment/Plan:  1. PAF - she remains in NSR. She does not feel well. Looks quite pale to me - checking stat lab - further disposition to follow.   2. Dilated CM - EF normalized by echo back in 2017 - her weight is down. Swelling is chronic - I want to see what her lab show prior to proceeding with further evaluation.   3. HTN - BP is fine on her current regimen.   4. LBBB - chronic  5. Chronic anticoagulation - needs baseline labs updated. She appears quite pale to me today/stools are dark -  but I have never seen her before to know what she looks like. Further disposition to follow.    Current medicines are reviewed with the patient today.  The patient does not have concerns regarding medicines other than what has been noted above.  The following changes have been made:  See above.  Labs/ tests ordered today include:    Orders Placed This Encounter  Procedures  . Pro b natriuretic peptide (BNP)  . Basic metabolic panel  . CBC     Disposition:   Further disposition to follow.   Patient is agreeable to this plan and will call if any problems develop in the interim.   SignedTruitt Merle, NP  05/21/2017 3:09 PM  [facility] [address] [place], [place]  [address] Phone: [phone] Fax: [phone]

## 2017-05-21 NOTE — H&P (Signed)
History and Physical    Charlotte [name] BMW:[id number] DOB: [birth year] DOA: 05/21/2017  PCP: Charlotte Marry, MD   Patient coming from: Home  Chief Complaint: Fatigue, dark stools  HPI: Charlotte [name] is a 81 y.o. female with medical history significant for paroxysmal atrial fibrillation on Xarelto, coronary artery disease, chronic diastolic CHF, type 2 diabetes mellitus, hypertension, and GERD, now presenting to the emergency department at the direction of her cardiologist for evaluation of fatigue, shortness of breath, and low hemoglobin. Patient saw her cardiologist today for a routine checkup, reported feeling increasingly short of breath and fatigued over the recent weeks, and notes that her stool has been darker over the same interval. She denies any chest pain or palpitations and denies any loss of consciousness. There has not been any recent fevers or chills, cough, or increase in her chronic lower extremity swelling. Basic blood work was obtained after her clinic visit today and hemoglobin was noted to be in the 7-range, significantly lower from priors. She was directed to the ED for further evaluation.  ED Course: Upon arrival to the ED, patient is found to be afebrile, saturating well on room air, and with vital signs stable. EKG features a sinus rhythm with first degree AV nodal block and chronic LBBB. Chemistry panel is notable for a sodium of 133, BUN of 29, and creatinine 1.07 which appears stable. CBC features a hemoglobin of 7.3, down from 14 remotely and with no recent priors available for comparison. DRE revealed dark brown stool which is FOBT positive. One unit of packed red blood cells was ordered for immediate transfusion in the ED. Patient remained hemodynamically stable and in no pallor rest or distress and will be admitted to the telemetry unit for ongoing evaluation and management of symptomatic anemia with occult GI bleeding in a patient on Xarelto for atrial  fibrillation.  Review of Systems:  All other systems reviewed and apart from HPI, are negative.  Past Medical History:  Diagnosis Date  . Aortic insufficiency   . Atrial fibrillation ([place])    a. s/p DCCV 03/2013  . CAD (coronary artery disease)    LHC 06/2010: EF 55%, mild plaque in the LAD 20-30%, otherwise normal coronary arteries  . CATARACTS   . Chronic systolic heart failure ([place])    in setting of AFib;  Echocardiogram 02/2013: Moderate LVH, EF 25-30%, anteroseptal and apical HK, moderate AI, MAC, moderate MR, moderate LAE, mild RAE, PASP 32 => after DCCV f/u Echo 8/14: Moderate LVH, EF 60-65%, normal wall motion, grade 1 diastolic dysfunction, moderate AI, moderate LAE  . Complication of anesthesia 2009   nausea and vomitting  . Diabetes mellitus without complication ([place])   . GERD (gastroesophageal reflux disease)   . HYPERLIPIDEMIA, MILD   . HYPERTENSION, BENIGN SYSTEMIC   . Irritable bowel syndrome   . LBBB (left bundle branch block)   . MEDIAL EPICONDYLITIS   . NSVT (nonsustained ventricular tachycardia) ([place])    during Dob Echo 2011 => normal cath  . OSTEOARTHRITIS   . OSTEOPENIA   . RHINITIS, ALLERGIC   . SYNCOPE   . VITAMIN D DEFICIENCY     Past Surgical History:  Procedure Laterality Date  . CARDIOVERSION N/A 04/01/2013   Procedure: CARDIOVERSION;  Surgeon: Josue Hector, MD;  Location: [facility];  Service: Cardiovascular;  Laterality: N/A;  . CATARACT EXTRACTION, BILATERAL  2012   Dr. Herbert Deaner  . RADICAL HYSTERECTOMY    . REPLACEMENT TOTAL KNEE  11/03/07 right....... 03/29/08 left     reports that she quit smoking about 46 years ago. She has never used smokeless tobacco. She reports that she does not drink alcohol or use drugs.  Allergies  Allergen Reactions  . Amlodipine Besylate-Valsartan Other (See Comments)    HEADACHE     Family History  Problem Relation Age of Onset  . Arrhythmia Mother        afib  . Alzheimer's disease Mother   . Colon  cancer Father   . Heart attack Father 32  . Diabetes Sister   . Arrhythmia Sister        afib     Prior to Admission medications   Medication Sig Start Date End Date Taking? Authorizing Provider  Acetaminophen 500 MG coapsule Take 500 mg by mouth every 6 (six) hours as needed for pain.  04/08/17  Yes [provider]  Calcium Carb-Cholecalciferol (CALCIUM 600/VITAMIN D3) 600-800 MG-UNIT TABS Take 1 tablet by mouth 2 (two) times daily.   Yes [provider]  carvedilol (COREG) 6.25 MG tablet TAKE 1 TABLET TWICE DAILY Patient taking differently: TAKE 6.25 mg TABLET TWICE DAILY 05/14/17  Yes Josue Hector, MD  furosemide (LASIX) 20 MG tablet Take 20 mg by mouth 2 (two) times daily. Except when pt's has prp treatment   Yes [provider]  gabapentin (NEURONTIN) 300 MG capsule TAKE 1 CAPSULE FOUR TIMES DAILY Patient taking differently: TAKE 300 mg CAPSULE Three TIMES DAILY 02/06/17  Yes Charlotte Marry, MD  losartan (COZAAR) 100 MG tablet TAKE 1 TABLET EVERY DAY Patient taking differently: TAKE 100 mg TABLET EVERY DAY 03/27/17  Yes Josue Hector, MD  metFORMIN (GLUCOPHAGE) 500 MG tablet TAKE 1 TABLET TWICE DAILY WITH MEALS Patient taking differently: TAKE 500 mg TABLET TWICE DAILY WITH MEALS 05/14/17  Yes Charlotte Marry, MD  metolazone (ZAROXOLYN) 2.5 MG tablet TAKE 1 TABLET EVERY OTHER DAY Patient taking differently: TAKE 2.5 mg TABLET EVERY OTHER DAY 01/28/17  Yes Josue Hector, MD  Multiple Vitamin (MULTIVITAMIN) capsule Take 1 capsule by mouth daily.     Yes [provider]  polyvinyl alcohol-povidone (REFRESH) 1.4-0.6 % ophthalmic solution Place 1-2 drops into both eyes daily as needed (FOR DRY EYES).    Yes [provider]  potassium chloride (K-DUR,KLOR-CON) 10 MEQ tablet Take 1 tablet (10 mEq total) by mouth 2 (two) times daily. 12/03/16  Yes Josue Hector, MD  psyllium (HYDROCIL/METAMUCIL) 95 % PACK Take 1 packet by mouth daily.    Yes [provider]  verapamil (VERELAN PM) 240 MG 24 hr capsule TAKE 1 CAPSULE AT BEDTIME Patient taking differently: TAKE 240 mg CAPSULE AT BEDTIME 03/27/17  Yes Josue Hector, MD  vitamin B-12 (CYANOCOBALAMIN) 1000 MCG tablet Take 1,000 mcg by mouth daily.     Yes [provider]  XARELTO 20 MG TABS tablet TAKE 1 TABLET EVERY DAY WITH SUPPER Patient taking differently: TAKE 20 mg TABLET EVERY DAY WITH SUPPER 03/07/17  Yes Josue Hector, MD  acetaminophen (TYLENOL) 650 MG CR tablet Take 2 tablets (1,300 mg total) by mouth every 8 (eight) hours as needed for pain. Patient not taking: Reported on 05/21/2017 11/25/14   Silverio Decamp, MD    Physical Exam: Vitals:   05/21/17 1724 05/21/17 1930 05/21/17 2000  BP: 129/69 (!) 147/66 (!) 145/71  Pulse: 97 77 75  Resp: 16    Temp: 98.1 F (36.7 C)    TempSrc: Oral  SpO2: 95% 97% 98%  Weight: 111.1 kg (245 lb)    Height: 5' 2.5" (1.588 m)        Constitutional: NAD, calm, comfortable, obese Eyes: PERTLA, lids and conjunctivae normal ENMT: Mucous membranes are moist. Posterior pharynx clear of any exudate or lesions.   Neck: normal, supple, no masses, no thyromegaly Respiratory: clear to auscultation bilaterally, breath sounds slightly diminished bilaterally. Dyspnea with speech. No accessory muscle use.  Cardiovascular: S1 & S2 heard, regular rate and rhythm. 1+ pretibial edema bilaterally. No significant JVD. Abdomen: No distension, no tenderness, no masses palpated. Bowel sounds normal.  Musculoskeletal: no clubbing / cyanosis. No joint deformity upper and lower extremities.  Skin: no significant rashes, lesions, ulcers. Warm, dry, well-perfused. Neurologic: CN 2-12 grossly intact. Sensation intact, DTR normal. Strength 5/5 in all 4 limbs.  Psychiatric: Alert and oriented x 3. Calm, cooperative.     Labs on Admission: I have personally reviewed following labs and imaging studies  CBC:  Recent  Labs Lab 05/21/17 1509 05/21/17 1729  WBC 7.0 7.5  HGB 7.4* 7.3*  HCT 23.2* 24.0*  MCV 71* 74.3*  PLT 440* 482*   Basic Metabolic Panel:  Recent Labs Lab 05/21/17 1509 05/21/17 1729  NA 136 133*  K 3.4* 3.6  CL 98 98*  CO2 23 23  GLUCOSE 108* 99  BUN 28* 29*  CREATININE 1.05* 1.07*  CALCIUM 9.4 9.1   GFR: Estimated Creatinine Clearance: 50.6 mL/min (A) (by C-G formula based on SCr of 1.07 mg/dL (H)). Liver Function Tests: No results for input(s): AST, ALT, ALKPHOS, BILITOT, PROT, ALBUMIN in the last 168 hours. No results for input(s): LIPASE, AMYLASE in the last 168 hours. No results for input(s): AMMONIA in the last 168 hours. Coagulation Profile: No results for input(s): INR, PROTIME in the last 168 hours. Cardiac Enzymes: No results for input(s): CKTOTAL, CKMB, CKMBINDEX, TROPONINI in the last 168 hours. BNP (last 3 results) No results for input(s): PROBNP in the last 8760 hours. HbA1C: No results for input(s): HGBA1C in the last 72 hours. CBG: No results for input(s): GLUCAP in the last 168 hours. Lipid Profile: No results for input(s): CHOL, HDL, LDLCALC, TRIG, CHOLHDL, LDLDIRECT in the last 72 hours. Thyroid Function Tests: No results for input(s): TSH, T4TOTAL, FREET4, T3FREE, THYROIDAB in the last 72 hours. Anemia Panel: No results for input(s): VITAMINB12, FOLATE, FERRITIN, TIBC, IRON, RETICCTPCT in the last 72 hours. Urine analysis:    Component Value Date/Time   COLORURINE YELLOW 03/23/2008 1050   APPEARANCEUR CLEAR 03/23/2008 1050   LABSPEC 1.010 03/23/2008 1050   PHURINE 6.0 03/23/2008 1050   GLUCOSEU NEGATIVE 03/23/2008 1050   HGBUR NEGATIVE 03/23/2008 1050   BILIRUBINUR NEGATIVE 03/23/2008 1050   KETONESUR NEGATIVE 03/23/2008 1050   PROTEINUR NEGATIVE 03/23/2008 1050   UROBILINOGEN 0.2 03/23/2008 1050   NITRITE NEGATIVE 03/23/2008 1050   LEUKOCYTESUR  03/23/2008 1050    NEGATIVE MICROSCOPIC NOT DONE ON URINES WITH NEGATIVE PROTEIN, BLOOD,  LEUKOCYTES, NITRITE, OR GLUCOSE <1000 mg/dL.   Sepsis Labs: _0 (procalcitonin:4,lacticidven:4) )No results found for this or any previous visit (from the past 240 hour(s)).   Radiological Exams on Admission: No results found.  EKG: Independently reviewed. Sinus rhythm, 1st degree AV block, chronic LBBB.   Assessment/Plan  1. Symptomatic anemia  - Pt reports insidious development of DOE and lightheadedness over weeks  - Found to have Hgb 7.3; was normal remotely, no recent labs  - She acknowledges dark stools, FOBT is positive  - Reports having  colonoscopy at age 75 with polypectomy  - She is hemodynamically stable in ED - 1 unit of RBC's infusing in ED - Hold Xarelto, check post-transfusion CBC    2. Atrial fibrillation, paroxysmal  - In a sinus rhythm on admission  - CHADS-VASc is 3 (age, gender, CHF, HTN, DM) - Xarelto held in light of suspected GIB with symptomatic anemia  - Continue verapamil  3. Chronic diastolic CHF  - Appears hypovolemic on admission  - Plan to SLIV after transfusion, hold diuretics, follow daily wts and I/O's, continue Coreg and losartan    4. Hyponatremia  - Serum sodium is 133 on admission  - This is in setting of hypovolemia  - Transfusing 1 unit RBC's, holding diuretics, repeating chem panel in am    5. Type II DM  - A1c was only 5.6% in May '18  - Managed at home with metformin only  - Check CBG with meals and qHS  - Start a low-intensity sliding-scale with Novolog prn   6. Hypertension  - BP at goal  - Continue Coreg and losartan as tolerated    DVT prophylaxis: SCD's  Code Status: Full  Family Communication: Daughter updated at bedside Disposition Plan: Admit to telemetry Consults called: None Admission status: Inpatient    Vianne Bulls, MD [facility] Pager [phone]  If 7PM-7AM, please contact night-coverage [email or web address] Password [facility]  05/21/2017, 9:52 PM

## 2017-05-21 NOTE — Patient Instructions (Addendum)
We will be checking the following labs today - STAT BMET, STAT CBC and BNP   Medication Instructions:    Continue with your current medicines for now.     Testing/Procedures To Be Arranged:  N/A  Follow-Up:   Let's see what your labs show - then we will decide about follow up.      Other Special Instructions:   N/A    If you need a refill on your cardiac medications before your next appointment, please call your pharmacy.   Call the [facility] office at [phone] if you have any questions, problems or concerns.

## 2017-05-21 NOTE — ED Notes (Signed)
Patient endorses seeing her PCP earlier today for bloodwork - was called and told to go to ED d/t Hgb 7.3. Patient endorses dizziness upon standing at times, lightheadedness with long periods of standing, and dark stools. She states she stays constipated off and on. Last BM yesterday. Denies abd pain, N/V, no lightheadedness at rest. Patient ambulatory with assistance from cane.

## 2017-05-21 NOTE — ED Provider Notes (Signed)
[facility] DEPT Provider Note   CSN: [id number] Arrival date & time: 05/21/17  1716     History   Chief Complaint Chief Complaint  Patient presents with  . Anemia    HPI Charlotte [name] is a 81 y.o. female.  Patient with history of anticoagulation due to atrial fibrillation, coronary artery disease, systolic heart failure, diabetes,  Colon polyps on previous colonoscopy, no history of blood transfusion -- presents with complaints of anemia. Patient states that she saw her primary care physician today for routine physical. She had labs drawn and her hemoglobin was found to be 7. She was referred to the emergency department for further evaluation and workup. Patient does report "dark" stools over the past 2-3 months with associated fatigue,exertional shortness of breath, generalized weakness. She states that some days she just doesn't when even get up. She has a greatly decreased activity level. No chest pain. She denies red blood in her stool, urine. No blood gum bleeding. No easy bruising. Patient denies NSAID or alcohol use. She has not had any abdominal pains. Onset of symptoms insidious. Course is gradually worsening. Nothing makes symptoms better.      Past Medical History:  Diagnosis Date  . Aortic insufficiency   . Atrial fibrillation ([place])    a. s/p DCCV 03/2013  . CAD (coronary artery disease)    LHC 06/2010: EF 55%, mild plaque in the LAD 20-30%, otherwise normal coronary arteries  . CATARACTS   . Chronic systolic heart failure ([place])    in setting of AFib;  Echocardiogram 02/2013: Moderate LVH, EF 25-30%, anteroseptal and apical HK, moderate AI, MAC, moderate MR, moderate LAE, mild RAE, PASP 32 => after DCCV f/u Echo 8/14: Moderate LVH, EF 60-65%, normal wall motion, grade 1 diastolic dysfunction, moderate AI, moderate LAE  . Complication of anesthesia 2009   nausea and vomitting  . Diabetes mellitus without complication ([place])   . GERD (gastroesophageal reflux disease)     . HYPERLIPIDEMIA, MILD   . HYPERTENSION, BENIGN SYSTEMIC   . Irritable bowel syndrome   . LBBB (left bundle branch block)   . MEDIAL EPICONDYLITIS   . NSVT (nonsustained ventricular tachycardia) ([place])    during Dob Echo 2011 => normal cath  . OSTEOARTHRITIS   . OSTEOPENIA   . RHINITIS, ALLERGIC   . SYNCOPE   . VITAMIN D DEFICIENCY     Patient Active Problem List   Diagnosis Date Noted  . Symptomatic anemia 05/21/2017  . Hyponatremia 05/21/2017  . Constipation 05/10/2015  . Atrial fibrillation ([place]) 11/10/2014  . Severe obesity (BMI >= 40) ([place]) 06/18/2014  . Aortic regurgitation 03/03/2014  . Diabetes mellitus type 2 in obese ([place]) 12/09/2013  . Long term current use of anticoagulant therapy 05/14/2013  . VITAMIN D DEFICIENCY 10/31/2010  . VENTRICULAR TACHYCARDIA 05/31/2010  . SYNCOPE 04/24/2010  . DYSPNEA 04/24/2010  . CATARACTS 04/20/2010  . EDEMA 02/28/2010  . ELECTROCARDIOGRAM, ABNORMAL 09/19/2007  . Osteoarthritis of both ankles 01/01/2007  . Hyperlipidemia 08/13/2006  . HYPERTENSION, BENIGN SYSTEMIC 07/16/2006  . RHINITIS, ALLERGIC 07/16/2006  . GASTROESOPHAGEAL REFLUX, NO ESOPHAGITIS 07/16/2006  . IRRITABLE BOWEL SYNDROME 07/16/2006  . OSTEOPENIA 07/16/2006    Past Surgical History:  Procedure Laterality Date  . CARDIOVERSION N/A 04/01/2013   Procedure: CARDIOVERSION;  Surgeon: Josue Hector, MD;  Location: [facility];  Service: Cardiovascular;  Laterality: N/A;  . CATARACT EXTRACTION, BILATERAL  2012   Dr. Herbert Deaner  . RADICAL HYSTERECTOMY    . REPLACEMENT TOTAL KNEE  11/03/07 right....... 03/29/08 left    OB History    No data available       Home Medications    Prior to Admission medications   Medication Sig Start Date End Date Taking? Authorizing Provider  Acetaminophen 500 MG coapsule Take 500 mg by mouth every 6 (six) hours as needed for pain.  04/08/17  Yes [provider]  Calcium Carb-Cholecalciferol (CALCIUM 600/VITAMIN D3)  600-800 MG-UNIT TABS Take 1 tablet by mouth 2 (two) times daily.   Yes [provider]  carvedilol (COREG) 6.25 MG tablet TAKE 1 TABLET TWICE DAILY Patient taking differently: TAKE 6.25 mg TABLET TWICE DAILY 05/14/17  Yes Josue Hector, MD  furosemide (LASIX) 20 MG tablet Take 20 mg by mouth 2 (two) times daily. Except when pt's has prp treatment   Yes [provider]  gabapentin (NEURONTIN) 300 MG capsule TAKE 1 CAPSULE FOUR TIMES DAILY Patient taking differently: TAKE 300 mg CAPSULE Three TIMES DAILY 02/06/17  Yes [name], MD  losartan (COZAAR) 100 MG tablet TAKE 1 TABLET EVERY DAY Patient taking differently: TAKE 100 mg TABLET EVERY DAY 03/27/17  Yes Josue Hector, MD  metFORMIN (GLUCOPHAGE) 500 MG tablet TAKE 1 TABLET TWICE DAILY WITH MEALS Patient taking differently: TAKE 500 mg TABLET TWICE DAILY WITH MEALS 05/14/17  Yes [name], MD  metolazone (ZAROXOLYN) 2.5 MG tablet TAKE 1 TABLET EVERY OTHER DAY Patient taking differently: TAKE 2.5 mg TABLET EVERY OTHER DAY 01/28/17  Yes Josue Hector, MD  Multiple Vitamin (MULTIVITAMIN) capsule Take 1 capsule by mouth daily.     Yes [provider]  polyvinyl alcohol-povidone (REFRESH) 1.4-0.6 % ophthalmic solution Place 1-2 drops into both eyes daily as needed (FOR DRY EYES).    Yes [provider]  potassium chloride (K-DUR,KLOR-CON) 10 MEQ tablet Take 1 tablet (10 mEq total) by mouth 2 (two) times daily. 12/03/16  Yes Josue Hector, MD  psyllium (HYDROCIL/METAMUCIL) 95 % PACK Take 1 packet by mouth daily.   Yes [provider]  verapamil (VERELAN PM) 240 MG 24 hr capsule TAKE 1 CAPSULE AT BEDTIME Patient taking differently: TAKE 240 mg CAPSULE AT BEDTIME 03/27/17  Yes Josue Hector, MD  vitamin B-12 (CYANOCOBALAMIN) 1000 MCG tablet Take 1,000 mcg by mouth daily.     Yes [provider]  XARELTO 20 MG TABS tablet TAKE 1 TABLET EVERY DAY WITH SUPPER Patient taking  differently: TAKE 20 mg TABLET EVERY DAY WITH SUPPER 03/07/17  Yes Josue Hector, MD  acetaminophen (TYLENOL) 650 MG CR tablet Take 2 tablets (1,300 mg total) by mouth every 8 (eight) hours as needed for pain. Patient not taking: Reported on 05/21/2017 11/25/14   Silverio Decamp, MD    Family History Family History  Problem Relation Age of Onset  . Arrhythmia Mother        afib  . Alzheimer's disease Mother   . Colon cancer Father   . Heart attack Father 45  . Diabetes Sister   . Arrhythmia Sister        afib    Social History Social History  Substance Use Topics  . Smoking status: Former Smoker    Quit date: 10/08/1970  . Smokeless tobacco: Never Used  . Alcohol use No     Allergies   Amlodipine besylate-valsartan   Review of Systems Review of Systems  Constitutional: Positive for fatigue. Negative for fever.  HENT: Negative for rhinorrhea and sore throat.   Eyes:  Negative for redness.  Respiratory: Positive for shortness of breath. Negative for cough.   Cardiovascular: Negative for chest pain.  Gastrointestinal: Negative for abdominal pain, blood in stool, diarrhea, nausea and vomiting.  Genitourinary: Negative for dysuria.  Musculoskeletal: Negative for myalgias.  Skin: Negative for rash.  Neurological: Positive for weakness. Negative for headaches.     Physical Exam Updated Vital Signs BP (!) 145/71   Pulse 75   Temp 98.1 F (36.7 C) (Oral)   Resp 16   Ht 5' 2.5" (1.588 m)   Wt 111.1 kg (245 lb)   SpO2 98%   BMI 44.10 kg/m   Physical Exam  Constitutional: She appears well-developed and well-nourished.  HENT:  Head: Normocephalic and atraumatic.  Mouth/Throat: Oropharynx is clear and moist.  Eyes: Right eye exhibits no discharge. Left eye exhibits no discharge.  Conjunctival pallor  Neck: Normal range of motion. Neck supple.  Cardiovascular: Normal rate, regular rhythm and normal heart sounds.   No tachycardia.  Pulmonary/Chest: Effort  normal and breath sounds normal. No respiratory distress. She has no wheezes. She has no rales.  Abdominal: Soft. There is no tenderness.  Genitourinary: Rectal exam shows external hemorrhoid (nonthrombosed, no active bleeding) and guaiac positive stool. Rectal exam shows no internal hemorrhoid, no mass and no tenderness.  Genitourinary Comments: Dark brown stool noted  Musculoskeletal: She exhibits no edema or tenderness.  Neurological: She is alert.  Skin: Skin is warm and dry.  Psychiatric: She has a normal mood and affect.  Nursing note and vitals reviewed.    ED Treatments / Results  Labs (all labs ordered are listed, but only abnormal results are displayed) Labs Reviewed  BASIC METABOLIC PANEL - Abnormal; Notable for the following:       Result Value   Sodium 133 (*)    Chloride 98 (*)    BUN 29 (*)    Creatinine, Ser 1.07 (*)    GFR calc non Af Amer 48 (*)    GFR calc Af Amer 56 (*)    All other components within normal limits  CBC - Abnormal; Notable for the following:    RBC 3.23 (*)    Hemoglobin 7.3 (*)    HCT 24.0 (*)    MCV 74.3 (*)    MCH 22.6 (*)    RDW 16.4 (*)    Platelets 415 (*)    All other components within normal limits  POC OCCULT BLOOD, ED - Abnormal; Notable for the following:    Fecal Occult Bld POSITIVE (*)    All other components within normal limits  BASIC METABOLIC PANEL  VITAMIN Z56  FOLATE  IRON AND TIBC  FERRITIN  RETICULOCYTES  TYPE AND SCREEN  ABO/RH  PREPARE RBC (CROSSMATCH)    EKG  EKG Interpretation  Date/Time:  Tuesday May 21 2017 17:28:34 EDT Ventricular Rate:  95 PR Interval:  234 QRS Duration: 138 QT Interval:  376 QTC Calculation: 472 R Axis:   -39 Text Interpretation:  Sinus rhythm with 1st degree A-V block Left axis deviation Left bundle branch block Abnormal ECG no significant change since 2014 Confirmed by Sherwood Gambler [phone]) on 05/21/2017 8:57:35 PM       Radiology No results  found.  Procedures Procedures (including critical care time)  Medications Ordered in ED Medications  0.9 %  sodium chloride infusion (not administered)     Initial Impression / Assessment and Plan / ED Course  I have reviewed the triage vital signs and the nursing notes.  Pertinent labs & imaging results that were available during my care of the patient were reviewed by me and considered in my medical decision making (see chart for details).     Patient seen and examined. Patient discussed with Dr. Regenia Skeeter who will see patient.  Vital signs reviewed and are as follows: BP (!) 145/71   Pulse 75   Temp 98.1 F (36.7 C) (Oral)   Resp 16   Ht 5' 2.5" (1.588 m)   Wt 111.1 kg (245 lb)   SpO2 98%   BMI 44.10 kg/m   Fecal occult blood test is positive. Will transfuse patient 1 unit of packed red blood cells. Will admit for GI bleeding and anemia evaluation.  Patient and daughter updated on plan. They are in agreement. Discussed risks and benefits of blood transfusion.  Discussed patient with Dr. Myna Hidalgo who will see and admit.  Final Clinical Impressions(s) / ED Diagnoses   Final diagnoses:  Gastrointestinal hemorrhage, unspecified gastrointestinal hemorrhage type   Patient with symptomatic anemia, likely secondary to occult GI bleeding. Admit for workup and treatment.   New Prescriptions New Prescriptions   No medications on file       Carlisle Cater, Hershal Coria 05/21/17 2155    Sherwood Gambler, MD 05/26/17 [phone]

## 2017-05-21 NOTE — ED Notes (Signed)
Patient verbalized understanding of RBC administration and denies any further needs or questions. Informed consent form signed and at bedside.

## 2017-05-21 NOTE — ED Notes (Signed)
EDP states okay for patient to eat/drink - provided with [place] sandwich and water.

## 2017-05-22 ENCOUNTER — Other Ambulatory Visit: Payer: Self-pay | Admitting: *Deleted

## 2017-05-22 DIAGNOSIS — E119 Type 2 diabetes mellitus without complications: Secondary | ICD-10-CM | POA: Diagnosis not present

## 2017-05-22 DIAGNOSIS — I480 Paroxysmal atrial fibrillation: Secondary | ICD-10-CM

## 2017-05-22 DIAGNOSIS — E669 Obesity, unspecified: Secondary | ICD-10-CM

## 2017-05-22 DIAGNOSIS — Z6841 Body Mass Index (BMI) 40.0 and over, adult: Secondary | ICD-10-CM | POA: Diagnosis not present

## 2017-05-22 DIAGNOSIS — R195 Other fecal abnormalities: Secondary | ICD-10-CM | POA: Diagnosis not present

## 2017-05-22 DIAGNOSIS — K254 Chronic or unspecified gastric ulcer with hemorrhage: Secondary | ICD-10-CM | POA: Diagnosis not present

## 2017-05-22 DIAGNOSIS — K210 Gastro-esophageal reflux disease with esophagitis: Secondary | ICD-10-CM | POA: Diagnosis not present

## 2017-05-22 DIAGNOSIS — D649 Anemia, unspecified: Secondary | ICD-10-CM

## 2017-05-22 DIAGNOSIS — I10 Essential (primary) hypertension: Secondary | ICD-10-CM

## 2017-05-22 DIAGNOSIS — E876 Hypokalemia: Secondary | ICD-10-CM | POA: Diagnosis not present

## 2017-05-22 DIAGNOSIS — D62 Acute posthemorrhagic anemia: Secondary | ICD-10-CM | POA: Diagnosis not present

## 2017-05-22 DIAGNOSIS — K259 Gastric ulcer, unspecified as acute or chronic, without hemorrhage or perforation: Secondary | ICD-10-CM | POA: Diagnosis not present

## 2017-05-22 DIAGNOSIS — E871 Hypo-osmolality and hyponatremia: Secondary | ICD-10-CM | POA: Diagnosis not present

## 2017-05-22 DIAGNOSIS — K922 Gastrointestinal hemorrhage, unspecified: Secondary | ICD-10-CM

## 2017-05-22 DIAGNOSIS — E1169 Type 2 diabetes mellitus with other specified complication: Secondary | ICD-10-CM

## 2017-05-22 DIAGNOSIS — K219 Gastro-esophageal reflux disease without esophagitis: Secondary | ICD-10-CM | POA: Diagnosis not present

## 2017-05-22 DIAGNOSIS — I5032 Chronic diastolic (congestive) heart failure: Secondary | ICD-10-CM | POA: Diagnosis not present

## 2017-05-22 DIAGNOSIS — K5730 Diverticulosis of large intestine without perforation or abscess without bleeding: Secondary | ICD-10-CM | POA: Diagnosis not present

## 2017-05-22 DIAGNOSIS — D509 Iron deficiency anemia, unspecified: Secondary | ICD-10-CM | POA: Diagnosis not present

## 2017-05-22 LAB — FERRITIN: Ferritin: 6 ng/mL — ABNORMAL LOW (ref 11–307)

## 2017-05-22 LAB — HEMOGLOBIN AND HEMATOCRIT, BLOOD
HEMATOCRIT: 24.1 % — AB (ref 36.0–46.0)
HEMATOCRIT: 27.2 % — AB (ref 36.0–46.0)
HEMOGLOBIN: 8.2 g/dL — AB (ref 12.0–15.0)
Hemoglobin: 7.5 g/dL — ABNORMAL LOW (ref 12.0–15.0)

## 2017-05-22 LAB — GLUCOSE, CAPILLARY
GLUCOSE-CAPILLARY: 107 mg/dL — AB (ref 65–99)
GLUCOSE-CAPILLARY: 122 mg/dL — AB (ref 65–99)
GLUCOSE-CAPILLARY: 112 mg/dL — AB (ref 65–99)
GLUCOSE-CAPILLARY: 110 mg/dL — AB (ref 65–99)
Glucose-Capillary: 123 mg/dL — ABNORMAL HIGH (ref 65–99)
Glucose-Capillary: 129 mg/dL — ABNORMAL HIGH (ref 65–99)

## 2017-05-22 LAB — PRO B NATRIURETIC PEPTIDE: NT-Pro BNP: 332 pg/mL (ref 0–738)

## 2017-05-22 LAB — RETICULOCYTES
RBC.: 3.38 MIL/uL — AB (ref 3.87–5.11)
RETIC COUNT ABSOLUTE: 54.1 10*3/uL (ref 19.0–186.0)
RETIC CT PCT: 1.6 % (ref 0.4–3.1)

## 2017-05-22 LAB — PREPARE RBC (CROSSMATCH)

## 2017-05-22 LAB — BASIC METABOLIC PANEL
ANION GAP: 9 (ref 5–15)
BUN: 23 mg/dL — AB (ref 6–20)
CALCIUM: 9.1 mg/dL (ref 8.9–10.3)
CHLORIDE: 101 mmol/L (ref 101–111)
CO2: 26 mmol/L (ref 22–32)
Creatinine, Ser: 0.87 mg/dL (ref 0.44–1.00)
Glucose, Bld: 107 mg/dL — ABNORMAL HIGH (ref 65–99)
POTASSIUM: 3.1 mmol/L — AB (ref 3.5–5.1)
Sodium: 136 mmol/L (ref 135–145)

## 2017-05-22 LAB — IRON AND TIBC
Iron: 72 ug/dL (ref 28–170)
Saturation Ratios: 16 % (ref 10.4–31.8)
TIBC: 441 ug/dL (ref 250–450)
UIBC: 369 ug/dL

## 2017-05-22 LAB — VITAMIN B12: Vitamin B-12: 319 pg/mL (ref 180–914)

## 2017-05-22 LAB — FOLATE: Folate: 15.2 ng/mL (ref >5.9)

## 2017-05-22 MED ORDER — SODIUM CHLORIDE 0.9 % IV SOLN
INTRAVENOUS | Status: DC
  Administered 2017-05-22: 09:00:00 via INTRAVENOUS

## 2017-05-22 MED ORDER — POTASSIUM CHLORIDE CRYS ER 20 MEQ PO TBCR
40.0000 meq | EXTENDED_RELEASE_TABLET | Freq: Once | ORAL | Status: AC
  Administered 2017-05-22: 40 meq via ORAL
  Filled 2017-05-22: qty 2

## 2017-05-22 MED ORDER — PEG 3350-KCL-NA BICARB-NACL 420 G PO SOLR
4000.0000 mL | Freq: Once | ORAL | Status: AC
  Administered 2017-05-22: 4000 mL via ORAL
  Filled 2017-05-22 (×2): qty 4000

## 2017-05-22 MED ORDER — SODIUM CHLORIDE 0.9 % IV SOLN
Freq: Once | INTRAVENOUS | Status: AC
  Administered 2017-05-22: 14:00:00 via INTRAVENOUS

## 2017-05-22 NOTE — Progress Notes (Signed)
Report received from [facility] and bedside rounding done. Pt awake and alert connected to scd's and is on low bed with mattress on the floor. In nad and is currently finishing up her oral prep for colonoscopy in am. Npo made aware. Will start assessment.

## 2017-05-22 NOTE — Consult Note (Signed)
Subjective:   HPI  The patient is a 81 year old female with a history of atrial fibrillation who is on Xarelto. She has multiple other medical problems including coronary artery disease chronic diastolic congestive heart failure type 2 diabetes and hypertension. She saw her cardiologist yesterday with complaints of fatigue and shortness of breath which she has been experiencing for a while now according to her. She was found to have a low hemoglobin. She was subsequently admitted. She was found to be heme positive. She denies black stools red blood in the stool or vomiting blood. She received one unit of blood in the emergency room.  Review of Systems No chest pain. She has been short of breath and feeling fatigued. She feels better today.  Past Medical History:  Diagnosis Date  . Aortic insufficiency   . Atrial fibrillation ([place])    a. s/p DCCV 03/2013  . CAD (coronary artery disease)    LHC 06/2010: EF 55%, mild plaque in the LAD 20-30%, otherwise normal coronary arteries  . CATARACTS   . Chronic systolic heart failure ([place])    in setting of AFib;  Echocardiogram 02/2013: Moderate LVH, EF 25-30%, anteroseptal and apical HK, moderate AI, MAC, moderate MR, moderate LAE, mild RAE, PASP 32 => after DCCV f/u Echo 8/14: Moderate LVH, EF 60-65%, normal wall motion, grade 1 diastolic dysfunction, moderate AI, moderate LAE  . Complication of anesthesia 2009   nausea and vomitting  . Diabetes mellitus without complication ([place])   . GERD (gastroesophageal reflux disease)   . HYPERLIPIDEMIA, MILD   . HYPERTENSION, BENIGN SYSTEMIC   . Irritable bowel syndrome   . LBBB (left bundle branch block)   . MEDIAL EPICONDYLITIS   . NSVT (nonsustained ventricular tachycardia) ([place])    during Dob Echo 2011 => normal cath  . OSTEOARTHRITIS   . OSTEOPENIA   . RHINITIS, ALLERGIC   . SYNCOPE   . VITAMIN D DEFICIENCY    Past Surgical History:  Procedure Laterality Date  . CARDIOVERSION N/A 04/01/2013    Procedure: CARDIOVERSION;  Surgeon: Josue Hector, MD;  Location: [facility];  Service: Cardiovascular;  Laterality: N/A;  . CATARACT EXTRACTION, BILATERAL  2012   Dr. Herbert Deaner  . RADICAL HYSTERECTOMY    . REPLACEMENT TOTAL KNEE     11/03/07 right....... 03/29/08 left   Social History   Social History  . Marital status: Divorced    Spouse name: N/A  . Number of children: 1  . Years of education: N/A   Occupational History  . Retired Retired   Social History Main Topics  . Smoking status: Former Smoker    Quit date: 10/08/1970  . Smokeless tobacco: Never Used  . Alcohol use No  . Drug use: No  . Sexual activity: Not on file   Other Topics Concern  . Not on file   Social History Narrative  . No narrative on file   family history includes Alzheimer's disease in her mother; Arrhythmia in her mother and sister; Colon cancer in her father; Diabetes in her sister; Heart attack (age of onset: 37) in her father.  Current Facility-Administered Medications:  .  0.9 %  sodium chloride infusion, 10 mL/hr, Intravenous, Once, Geiple, Joshua, PA-C .  0.9 %  sodium chloride infusion, 250 mL, Intravenous, PRN, Opyd, Timothy S, MD .  0.9 %  sodium chloride infusion, , Intravenous, Continuous, Rai, Ripudeep K, MD, Last Rate: 75 mL/hr at 05/22/17 0907 .  acetaminophen (TYLENOL) tablet 650 mg, 650 mg, Oral, Q6H  PRN, 650 mg at 05/22/17 0347 **OR** acetaminophen (TYLENOL) suppository 650 mg, 650 mg, Rectal, Q6H PRN, Opyd, Ilene Qua, MD .  calcium-vitamin D (OSCAL WITH D) 500-200 MG-UNIT per tablet 1 tablet, 1 tablet, Oral, BID, Opyd, Timothy S, MD .  carvedilol (COREG) tablet 6.25 mg, 6.25 mg, Oral, BID WC, Opyd, Timothy S, MD, 6.25 mg at 05/22/17 0900 .  gabapentin (NEURONTIN) capsule 300 mg, 300 mg, Oral, TID, Opyd, Ilene Qua, MD, 300 mg at 05/22/17 0329 .  HYDROcodone-acetaminophen (NORCO/VICODIN) 5-325 MG per tablet 1-2 tablet, 1-2 tablet, Oral, Q4H PRN, Opyd, Timothy S, MD .  insulin aspart  (novoLOG) injection 0-5 Units, 0-5 Units, Subcutaneous, QHS, Opyd, Timothy S, MD .  insulin aspart (novoLOG) injection 0-9 Units, 0-9 Units, Subcutaneous, TID WC, Opyd, Timothy S, MD .  losartan (COZAAR) tablet 100 mg, 100 mg, Oral, Daily, Opyd, Ilene Qua, MD .  multivitamin with minerals tablet 1 tablet, 1 tablet, Oral, Daily, Opyd, Timothy S, MD .  ondansetron (ZOFRAN) tablet 4 mg, 4 mg, Oral, Q6H PRN **OR** ondansetron (ZOFRAN) injection 4 mg, 4 mg, Intravenous, Q6H PRN, Opyd, Timothy S, MD .  pantoprazole (PROTONIX) injection 40 mg, 40 mg, Intravenous, Q12H, Opyd, Ilene Qua, MD, 40 mg at 05/22/17 0907 .  polyethylene glycol-electrolytes (NuLYTELY/GoLYTELY) solution 4,000 mL, 4,000 mL, Oral, Once, Anson Fret F, MD .  polyvinyl alcohol (LIQUIFILM TEARS) 1.4 % ophthalmic solution 1-2 drop, 1-2 drop, Both Eyes, Daily PRN, Opyd, Timothy S, MD .  sodium chloride flush (NS) 0.9 % injection 3 mL, 3 mL, Intravenous, Q12H, Opyd, Timothy S, MD .  sodium chloride flush (NS) 0.9 % injection 3 mL, 3 mL, Intravenous, Q12H, Opyd, Ilene Qua, MD, 3 mL at 05/22/17 0330 .  sodium chloride flush (NS) 0.9 % injection 3 mL, 3 mL, Intravenous, PRN, Opyd, Timothy S, MD .  verapamil (CALAN-SR) CR tablet 240 mg, 240 mg, Oral, QHS, Opyd, Ilene Qua, MD, 240 mg at 05/22/17 0330 .  vitamin B-12 (CYANOCOBALAMIN) tablet 1,000 mcg, 1,000 mcg, Oral, Daily, Opyd, Ilene Qua, MD, 1,000 mcg at 05/22/17 0907 Allergies  Allergen Reactions  . Amlodipine Besylate-Valsartan Other (See Comments)    HEADACHE      Objective:     BP (!) 128/56 (BP Location: Right Arm)   Pulse 63   Temp 98.3 F (36.8 C) (Oral)   Resp 16   Ht 5' 2.5" (1.588 m)   Wt 111.1 kg (245 lb)   SpO2 97%   BMI 44.10 kg/m   No distress  Heart irregular  Lungs clear  Abdomen soft and nontender  Laboratory No components found for: D1    Assessment:     Anemia  Heme-positive stool  Multiple other medical problems as mentioned above       Plan:     We discussed EGD and colonoscopy to further investigate. This will be scheduled for tomorrow. Lab Results  Component Value Date   HGB 7.3 (L) 05/21/2017   HGB 7.4 (L) 05/21/2017   HGB 13.8 03/27/2013   HGB 13.7 02/24/2013   HCT 24.0 (L) 05/21/2017   HCT 23.2 (L) 05/21/2017   HCT 41.0 03/27/2013   HCT 40.3 02/24/2013   ALKPHOS 73 03/01/2017   ALKPHOS 70 06/23/2014   ALKPHOS 63 06/19/2013   AST 13 03/01/2017   AST 19 06/23/2014   AST 23 06/19/2013   ALT 11 03/01/2017   ALT 16 06/23/2014   ALT 19 06/19/2013

## 2017-05-22 NOTE — Progress Notes (Signed)
Report received from [facility] and bedside rounding done.

## 2017-05-22 NOTE — Progress Notes (Signed)
[facility]                                                                              Patient Demographics  Charlotte [name], is a 81 y.o. female, DOB - [birth year], PHK:[id number]  Admit date - 05/21/2017   Admitting Physician Briscoe Deutscher, MD  Outpatient Primary MD for the patient is Agapito Games, MD  Outpatient specialists:   LOS - 1  days   Medical records reviewed and are as summarized below:    Chief Complaint  Patient presents with  . Anemia       Brief summary   Patient is a 81 year old female with paroxysmal Afib on xarelto, CAD, DM, and chronic diastolic CHF, hypertension, GERD presented with fatigue, dizziness, shortness of breath, fatigued over the recent weeks and was found to have hemoglobin of 7.3 Patient reported dark stools, was found to be FOBT positive. In ED, patient was transfused 1 unit of packed RBCs.   Assessment & Plan    Principal Problem:   Symptomatic anemia / GI bleed - Hemoglobin 7.3 in the ED on presentation, baseline 13.8 in June 2014, no recent lab work in chart available - Patient on xarelto for paroxysmal atrial fibrillation - Transfused 1 unit packed RBC overnight however hemoglobin still 7.5 this morning - Continue IV PPI, transfuse 2 more RBC transfusions - GI consulted, discussed with Dr. Evette Cristal, EGD, colonoscopy in a.m. - Hold xarelto, last xarelto dose was on 8/13  Active Problems:   HYPERTENSION, BENIGN SYSTEMIC - BP currently stable - For now continue verapamil    GASTROESOPHAGEAL REFLUX, NO ESOPHAGITIS - Continue IV PPI    Diabetes mellitus type 2 in obese (HCC) - Hemoglobin A1c 5.6 in May 2018 - On metformin at home, hold while inpatient - Continue sliding scale insulin     Atrial fibrillation (HCC) - Continue Coreg for rate control, - HOLD xarelto in light of GI bleed    Hyponatremia - Resolved  Hypokalemia - Replaced   Code Status: Full CODE STATUS DVT Prophylaxis:  SCD's Family  Communication: Discussed in detail with the patient, all imaging results, lab results explained to the patient    Disposition Plan:   Time Spent in minutes   25 minutes  Procedures:    Consultants:    Gastroenterology  Antimicrobials:      Medications  Scheduled Meds: . calcium-vitamin D  1 tablet Oral BID  . carvedilol  6.25 mg Oral BID WC  . gabapentin  300 mg Oral TID  . insulin aspart  0-5 Units Subcutaneous QHS  . insulin aspart  0-9 Units Subcutaneous TID WC  . losartan  100 mg Oral Daily  . multivitamin with minerals  1 tablet Oral Daily  . pantoprazole (PROTONIX) IV  40 mg Intravenous Q12H  . polyethylene glycol-electrolytes  4,000 mL Oral Once  . sodium chloride flush  3 mL Intravenous Q12H  . sodium chloride flush  3 mL Intravenous Q12H  . verapamil  240 mg Oral QHS  . vitamin B-12  1,000 mcg Oral Daily   Continuous Infusions: . sodium chloride    . sodium chloride    .  sodium chloride 75 mL/hr at 05/22/17 0907   PRN Meds:.sodium chloride, acetaminophen **OR** acetaminophen, HYDROcodone-acetaminophen, ondansetron **OR** ondansetron (ZOFRAN) IV, polyvinyl alcohol, sodium chloride flush   Antibiotics   Anti-infectives    None        Subjective:   Charlotte [name] was seen and examined today. Still feeling weak and lightheaded at the time of my examination. Patient denies  chest pain, shortness of breath, abdominal pain, N/V/D/C, numbess, tingling. No acute events overnight.    Objective:   Vitals:   05/22/17 0026 05/22/17 0145 05/22/17 0431 05/22/17 0909  BP: 134/61 (!) 138/50 (!) 114/46 (!) 128/56  Pulse: 91 90 69 63  Resp: 16  16   Temp: 98.2 F (36.8 C) 98.1 F (36.7 C) 98 F (36.7 C) 98.3 F (36.8 C)  TempSrc: Oral Oral Oral Oral  SpO2: 99%  99% 97%  Weight:      Height:        Intake/Output Summary (Last 24 hours) at 05/22/17 1241 Last data filed at 05/22/17 0949  Gross per 24 hour  Intake              404 ml  Output                 0 ml  Net              404 ml     Wt Readings from Last 3 Encounters:  05/21/17 111.1 kg (245 lb)  05/21/17 112.6 kg (248 lb 3.2 oz)  02/25/17 112.5 kg (248 lb)     Exam  General: Alert and oriented x 3, NAD  Eyes: PERRLA, EOMI, Anicteric Sclera,  HEENT:  Atraumatic, normocephalic, normal oropharynx  Cardiovascular: S1 S2 auscultated, no rubs, murmurs or gallops. Regular rate and rhythm.  Respiratory: Clear to auscultation bilaterally, no wheezing, rales or rhonchi  Gastrointestinal: Soft, nontender, nondistended, + bowel sounds  Ext: no pedal edema bilaterally  Neuro: AAOx3, Cr N's II- XII. Strength 5/5 upper and lower extremities bilaterally, speech clear, sensations grossly intact  Musculoskeletal: No digital cyanosis, clubbing  Skin: No rashes  Psych: Normal affect and demeanor, alert and oriented x3    Data Reviewed:  I have personally reviewed following labs and imaging studies  Micro Results No results found for this or any previous visit (from the past 240 hour(s)).  Radiology Reports No results found.  Lab Data:  CBC:  Recent Labs Lab 05/21/17 1509 05/21/17 1729 05/22/17 1115  WBC 7.0 7.5  --   HGB 7.4* 7.3* 7.5*  HCT 23.2* 24.0* 24.1*  MCV 71* 74.3*  --   PLT 440* 415*  --    Basic Metabolic Panel:  Recent Labs Lab 05/21/17 1509 05/21/17 1729 05/22/17 0535  NA 136 133* 136  K 3.4* 3.6 3.1*  CL 98 98* 101  CO2 23 23 26   GLUCOSE 108* 99 107*  BUN 28* 29* 23*  CREATININE 1.05* 1.07* 0.87  CALCIUM 9.4 9.1 9.1   GFR: Estimated Creatinine Clearance: 62.2 mL/min (by C-G formula based on SCr of 0.87 mg/dL). Liver Function Tests: No results for input(s): AST, ALT, ALKPHOS, BILITOT, PROT, ALBUMIN in the last 168 hours. No results for input(s): LIPASE, AMYLASE in the last 168 hours. No results for input(s): AMMONIA in the last 168 hours. Coagulation Profile: No results for input(s): INR, PROTIME in the last 168 hours. Cardiac  Enzymes: No results for input(s): CKTOTAL, CKMB, CKMBINDEX, TROPONINI in the last 168 hours. BNP (last 3 results)  Recent Labs  05/21/17 1510  PROBNP 332   HbA1C: No results for input(s): HGBA1C in the last 72 hours. CBG:  Recent Labs Lab 05/21/17 2318 05/22/17 0429 05/22/17 0853 05/22/17 1229  GLUCAP 123* 107* 122* 129*   Lipid Profile: No results for input(s): CHOL, HDL, LDLCALC, TRIG, CHOLHDL, LDLDIRECT in the last 72 hours. Thyroid Function Tests: No results for input(s): TSH, T4TOTAL, FREET4, T3FREE, THYROIDAB in the last 72 hours. Anemia Panel:  Recent Labs  05/22/17 0535  VITAMINB12 319  FOLATE 15.2  FERRITIN 6*  TIBC 441  IRON 72  RETICCTPCT 1.6   Urine analysis:    Component Value Date/Time   COLORURINE YELLOW 03/23/2008 1050   APPEARANCEUR CLEAR 03/23/2008 1050   LABSPEC 1.010 03/23/2008 1050   PHURINE 6.0 03/23/2008 1050   GLUCOSEU NEGATIVE 03/23/2008 1050   HGBUR NEGATIVE 03/23/2008 1050   BILIRUBINUR NEGATIVE 03/23/2008 1050   KETONESUR NEGATIVE 03/23/2008 1050   PROTEINUR NEGATIVE 03/23/2008 1050   UROBILINOGEN 0.2 03/23/2008 1050   NITRITE NEGATIVE 03/23/2008 1050   LEUKOCYTESUR  03/23/2008 1050    NEGATIVE MICROSCOPIC NOT DONE ON URINES WITH NEGATIVE PROTEIN, BLOOD, LEUKOCYTES, NITRITE, OR GLUCOSE <1000 mg/dL.     Thad Ranger M.D. [facility] 05/22/2017, 12:41 PM  Pager: [phone] Between 7am to 7pm - call Pager - [phone]  After 7pm go to [email or web address] - password [email or web address]  Call night coverage person covering after 7pm

## 2017-05-22 NOTE — Care Management Note (Signed)
Case Management Note  Patient Details  Name: NANETTA FAVORITE MRN: [id number] Date of Birth: [birth year]  Subjective/Objective:                 Patient from home alone admitted for symptomatic anemia. Plan for colonoscopy tomorrow, has been transfused. CM will continue to follow for DC planning.    Action/Plan:   Expected Discharge Date:                  Expected Discharge Plan:  Home/Self Care  In-House Referral:     Discharge planning Services  CM Consult  Post Acute Care Choice:    Choice offered to:     DME Arranged:    DME Agency:     HH Arranged:    HH Agency:     Status of Service:  In process, will continue to follow  If discussed at Long Length of Stay Meetings, dates discussed:    Additional Comments:  Lawerance Sabal, RN 05/22/2017, 3:09 PM

## 2017-05-22 NOTE — Care Management Important Message (Signed)
Important Message  Patient Details  Name: Charlotte [name] MRN: [id number] Date of Birth: 1936/05/17   Medicare Important Message Given:  Yes    Lamari Youngers Abena 05/22/2017, 9:23 AM

## 2017-05-23 ENCOUNTER — Encounter (HOSPITAL_COMMUNITY)
Admission: EM | Disposition: A | Discharge status: Home / Self Care | Payer: Self-pay | Source: Home / Self Care | Attending: Internal Medicine

## 2017-05-23 ENCOUNTER — Inpatient Hospital Stay (HOSPITAL_COMMUNITY): Payer: Medicare Other | Admitting: Anesthesiology

## 2017-05-23 ENCOUNTER — Ambulatory Visit: Payer: Medicare HMO | Admitting: Cardiovascular Disease

## 2017-05-23 ENCOUNTER — Encounter (HOSPITAL_COMMUNITY): Payer: Self-pay | Admitting: *Deleted

## 2017-05-23 DIAGNOSIS — Z6841 Body Mass Index (BMI) 40.0 and over, adult: Secondary | ICD-10-CM | POA: Diagnosis not present

## 2017-05-23 DIAGNOSIS — E1169 Type 2 diabetes mellitus with other specified complication: Secondary | ICD-10-CM | POA: Diagnosis not present

## 2017-05-23 DIAGNOSIS — D62 Acute posthemorrhagic anemia: Secondary | ICD-10-CM | POA: Diagnosis not present

## 2017-05-23 DIAGNOSIS — I5032 Chronic diastolic (congestive) heart failure: Secondary | ICD-10-CM | POA: Diagnosis not present

## 2017-05-23 DIAGNOSIS — K319 Disease of stomach and duodenum, unspecified: Secondary | ICD-10-CM | POA: Diagnosis not present

## 2017-05-23 DIAGNOSIS — K922 Gastrointestinal hemorrhage, unspecified: Secondary | ICD-10-CM | POA: Diagnosis not present

## 2017-05-23 DIAGNOSIS — E669 Obesity, unspecified: Secondary | ICD-10-CM | POA: Diagnosis not present

## 2017-05-23 DIAGNOSIS — D649 Anemia, unspecified: Secondary | ICD-10-CM | POA: Diagnosis not present

## 2017-05-23 DIAGNOSIS — K5730 Diverticulosis of large intestine without perforation or abscess without bleeding: Secondary | ICD-10-CM | POA: Diagnosis not present

## 2017-05-23 DIAGNOSIS — K210 Gastro-esophageal reflux disease with esophagitis: Secondary | ICD-10-CM | POA: Diagnosis not present

## 2017-05-23 DIAGNOSIS — R195 Other fecal abnormalities: Secondary | ICD-10-CM | POA: Diagnosis not present

## 2017-05-23 DIAGNOSIS — E119 Type 2 diabetes mellitus without complications: Secondary | ICD-10-CM | POA: Diagnosis not present

## 2017-05-23 DIAGNOSIS — E871 Hypo-osmolality and hyponatremia: Secondary | ICD-10-CM | POA: Diagnosis not present

## 2017-05-23 DIAGNOSIS — K254 Chronic or unspecified gastric ulcer with hemorrhage: Secondary | ICD-10-CM | POA: Diagnosis not present

## 2017-05-23 DIAGNOSIS — I10 Essential (primary) hypertension: Secondary | ICD-10-CM | POA: Diagnosis not present

## 2017-05-23 DIAGNOSIS — K219 Gastro-esophageal reflux disease without esophagitis: Secondary | ICD-10-CM | POA: Diagnosis not present

## 2017-05-23 DIAGNOSIS — K259 Gastric ulcer, unspecified as acute or chronic, without hemorrhage or perforation: Secondary | ICD-10-CM | POA: Diagnosis not present

## 2017-05-23 DIAGNOSIS — E876 Hypokalemia: Secondary | ICD-10-CM | POA: Diagnosis not present

## 2017-05-23 DIAGNOSIS — I480 Paroxysmal atrial fibrillation: Secondary | ICD-10-CM | POA: Diagnosis not present

## 2017-05-23 HISTORY — PX: ESOPHAGOGASTRODUODENOSCOPY (EGD) WITH PROPOFOL: SHX5813

## 2017-05-23 HISTORY — PX: COLONOSCOPY WITH PROPOFOL: SHX5780

## 2017-05-23 LAB — BPAM RBC
BLOOD PRODUCT EXPIRATION DATE: 201808292359
Blood Product Expiration Date: 201808292359
Blood Product Expiration Date: 201808292359
ISSUE DATE / TIME: 201808142153
ISSUE DATE / TIME: 201808151342
ISSUE DATE / TIME: 201808152020
Unit Type and Rh: 6200
Unit Type and Rh: 6200
Unit Type and Rh: 6200

## 2017-05-23 LAB — TYPE AND SCREEN
ABO/RH(D): A POS
Antibody Screen: NEGATIVE
Unit division: 0
Unit division: 0
Unit division: 0

## 2017-05-23 LAB — GLUCOSE, CAPILLARY
GLUCOSE-CAPILLARY: 109 mg/dL — AB (ref 65–99)
GLUCOSE-CAPILLARY: 114 mg/dL — AB (ref 65–99)
Glucose-Capillary: 113 mg/dL — ABNORMAL HIGH (ref 65–99)
Glucose-Capillary: 126 mg/dL — ABNORMAL HIGH (ref 65–99)

## 2017-05-23 LAB — HEMOGLOBIN AND HEMATOCRIT, BLOOD
HCT: 29.6 % — ABNORMAL LOW (ref 36.0–46.0)
HCT: 32.4 % — ABNORMAL LOW (ref 36.0–46.0)
HEMOGLOBIN: 9.4 g/dL — AB (ref 12.0–15.0)
Hemoglobin: 10.1 g/dL — ABNORMAL LOW (ref 12.0–15.0)

## 2017-05-23 LAB — BASIC METABOLIC PANEL
ANION GAP: 10 (ref 5–15)
BUN: 10 mg/dL (ref 6–20)
CO2: 21 mmol/L — ABNORMAL LOW (ref 22–32)
Calcium: 8.9 mg/dL (ref 8.9–10.3)
Chloride: 107 mmol/L (ref 101–111)
Creatinine, Ser: 0.66 mg/dL (ref 0.44–1.00)
GFR calc Af Amer: >60 mL/min (ref >60)
GFR calc non Af Amer: >60 mL/min (ref >60)
Glucose, Bld: 115 mg/dL — ABNORMAL HIGH (ref 65–99)
POTASSIUM: 3.9 mmol/L (ref 3.5–5.1)
SODIUM: 138 mmol/L (ref 135–145)

## 2017-05-23 SURGERY — COLONOSCOPY WITH PROPOFOL
Anesthesia: Monitor Anesthesia Care

## 2017-05-23 MED ORDER — LACTATED RINGERS IV SOLN
INTRAVENOUS | Status: AC | PRN
  Administered 2017-05-23: 1000 mL via INTRAVENOUS

## 2017-05-23 MED ORDER — PANTOPRAZOLE SODIUM 40 MG PO TBEC: 40.0000 mg | DELAYED_RELEASE_TABLET | Freq: Two times a day (BID) | ORAL | 4 refills | Status: DC

## 2017-05-23 MED ORDER — POLYSACCHARIDE IRON COMPLEX 150 MG PO CAPS: 150.0000 mg | ORAL_CAPSULE | Freq: Every day | ORAL | 3 refills | Status: DC

## 2017-05-23 MED ORDER — PANTOPRAZOLE SODIUM 40 MG PO TBEC: 40.0000 mg | DELAYED_RELEASE_TABLET | Freq: Two times a day (BID) | ORAL | Status: DC

## 2017-05-23 MED ORDER — SODIUM CHLORIDE 0.9 % IV SOLN: INTRAVENOUS | Status: DC

## 2017-05-23 MED ORDER — RIVAROXABAN 20 MG PO TABS: ORAL_TABLET | ORAL | 3 refills | Status: DC

## 2017-05-23 MED ORDER — LIDOCAINE HCL (CARDIAC) 20 MG/ML IV SOLN
INTRAVENOUS | Status: DC | PRN
  Administered 2017-05-23: 100 mg via INTRATRACHEAL

## 2017-05-23 MED ORDER — PROPOFOL 500 MG/50ML IV EMUL
INTRAVENOUS | Status: DC | PRN
  Administered 2017-05-23: 75 ug/kg/min via INTRAVENOUS

## 2017-05-23 SURGICAL SUPPLY — 25 items

## 2017-05-23 NOTE — Anesthesia Preprocedure Evaluation (Signed)
Anesthesia Evaluation  Patient identified by MRN, date of birth, ID band Patient awake    Reviewed: Allergy & Precautions, H&P , NPO status , Patient's Chart, lab work & pertinent test results, reviewed documented beta blocker date and time   History of Anesthesia Complications (+) PONV and history of anesthetic complications  Airway Mallampati: II  TM Distance: >3 FB Neck ROM: Full    Dental  (+) Teeth Intact   Pulmonary shortness of breath, former smoker,    Pulmonary exam normal        Cardiovascular hypertension, Pt. on medications and Pt. on home beta blockers + CAD and +CHF  Normal cardiovascular exam+ dysrhythmias Atrial Fibrillation   ECG: SR, rate 92. 1st degree AV block  ECHO: LV EF: 55% -   60%  Sees cardiologist   Neuro/Psych    GI/Hepatic   Endo/Other  diabetes, Oral Hypoglycemic AgentsMorbid obesity  Renal/GU      Musculoskeletal   Abdominal (+) + obese,   Peds  Hematology  (+) anemia ,   Anesthesia Other Findings HYPERLIPIDEMIA  Reproductive/Obstetrics                             Anesthesia Physical  Anesthesia Plan  ASA: III  Anesthesia Plan: MAC   Post-op Pain Management:    Induction: Intravenous  PONV Risk Score and Plan: Treatment may vary due to age or medical condition and Propofol infusion  Airway Management Planned: Nasal Cannula  Additional Equipment:   Intra-op Plan:   Post-operative Plan:   Informed Consent: I have reviewed the patients History and Physical, chart, labs and discussed the procedure including the risks, benefits and alternatives for the proposed anesthesia with the patient or authorized representative who has indicated his/her understanding and acceptance.   Dental advisory given  Plan Discussed with: CRNA and Surgeon  Anesthesia Plan Comments:         Anesthesia Quick Evaluation

## 2017-05-23 NOTE — H&P (Signed)
The patient presents to the endoscopy department for EGD and colonoscopy to evaluate anemia and heme positive stool. No change in history or physical  Physical: No distress Heart irregular  Lungs clear  Abdomen soft and nontender  Impression: Anemia and heme positive stool  Plan: EGD and colonoscopy

## 2017-05-23 NOTE — Progress Notes (Signed)
Discharge instructions, RX's and follow up appt explained and provided to patient and daughter verbalized understanding. Patient left floor via wheelchair accompanied by staff no c/o pain or shortness of breath at d/c.  Shantel Wesely, Kae Heller, RN

## 2017-05-23 NOTE — Op Note (Addendum)
[facility] Patient Name: Charlotte [name] Procedure Date : 05/23/2017 MRN: [id number] Attending MD: Graylin Shiver , MD Date of Birth: [birth year] CSN: [id number] Age: 81 Admit Type: Inpatient Procedure:                Colonoscopy Indications:              Heme positive stool Providers:                Graylin Shiver, MD, Norman Clay, RN, Oletha Blend,                            Technician Referring MD:              Medicines:                Propofol per Anesthesia Complications:            No immediate complications. Estimated Blood Loss:     Estimated blood loss: none. Procedure:                Pre-Anesthesia Assessment:                           - Prior to the procedure, a History and Physical                            was performed, and patient medications and                            allergies were reviewed. The patient's tolerance of                            previous anesthesia was also reviewed. The risks                            and benefits of the procedure and the sedation                            options and risks were discussed with the patient.                            All questions were answered, and informed consent                            was obtained. Prior Anticoagulants: The patient has                            taken anticoagulant medication, last dose was 3                            days prior to procedure. ASA Grade Assessment: III                            - A patient with severe systemic disease. After  reviewing the risks and benefits, the patient was                            deemed in satisfactory condition to undergo the                            procedure.                           - Prior to the procedure, a History and Physical                            was performed, and patient medications and                            allergies were reviewed. The patient's tolerance of   previous anesthesia was also reviewed. The risks                            and benefits of the procedure and the sedation                            options and risks were discussed with the patient.                            All questions were answered, and informed consent                            was obtained. Prior Anticoagulants: The patient has                            taken anticoagulant medication, last dose was 3                            days prior to procedure. ASA Grade Assessment: III                            - A patient with severe systemic disease. After                            reviewing the risks and benefits, the patient was                            deemed in satisfactory condition to undergo the                            procedure.                           After obtaining informed consent, the colonoscope                            was passed under direct vision. Throughout the  procedure, the patient's blood pressure, pulse, and                            oxygen saturations were monitored continuously. The                            EC-[id number] ([id number]) scope was introduced through                            the anus and advanced to the the cecum, identified                            by the ileocecal valve. The ileocecal valve and the                            rectum were photographed. The quality of the bowel                            preparation was adequate. Scope In: 9:21:35 AM Scope Out: 9:35:30 AM Scope Withdrawal Time: 0 hours 4 minutes 41 seconds  Total Procedure Duration: 0 hours 13 minutes 55 seconds  Findings:      The perianal and digital rectal examinations were normal.      The digital rectal exam was normal.      A few small-mouthed diverticula were found in the sigmoid colon.      The exam was otherwise without abnormality. Impression:               - Diverticulosis in the sigmoid colon.                            - The examination was otherwise normal.                           - No specimens collected. Moderate Sedation:      . Recommendation:           - Resume regular diet.                           - Continue present medications.                           - Repeat colonoscopy is not recommended for                            screening purposes. Procedure Code(s):        --- Professional ---                           [phone], Colonoscopy, flexible; diagnostic, including                            collection of specimen(s) by brushing or washing,                            when performed (separate procedure) Diagnosis Code(s):        ---  Professional ---                           R19.5, Other fecal abnormalities                           K57.30, Diverticulosis of large intestine without                            perforation or abscess without bleeding CPT copyright 2016 American Medical Association. All rights reserved. The codes documented in this report are preliminary and upon coder review may  be revised to meet current compliance requirements. Graylin Shiver, MD 05/23/2017 9:51:28 AM This report has been signed electronically. Number of Addenda: 0

## 2017-05-23 NOTE — Consult Note (Signed)
[facility] CM Primary Care Navigator  05/23/2017  Charlotte [name] [birth year] [id number]    Met with patient and daughter Butch Penny) at the bedside to identify possible discharge needs. Patient reports having dizziness, easily fatigued and low hemoglobin level (7.4) that had led to this admission. Patient endorses Dr. Beatrice Lecher with [facility] as the primary care provider.   Patient shared using [facility] Order service, [facility] and [facility] in [place] to obtain medications without difficulty.   Patient reports managing her own medications at home using "pill box" system filled weekly.   Patient reports being able to drive prior to admission. Daughter can provide transportationto herdoctors'appointments after discharge per patient.   She lives alone and fairly independent but daughter (lives next door) will be able to provide assistance with her needs at home.  Anticipated discharge plan is home per daughter.    Patient and daughter expressedunderstanding to call primary care provider's officewhen she returns back home,for a post discharge follow-up appointment within a week or sooner if needed.Patient letter (with PCP's contact number) was provided as a reminder.  Explained to patient about [facility] CM services available for healthmanagement but she denies any current needs or concerns at this time. Patient reports managing her chronic illnesses at home with daughter's (RN) help. Patient states not needing EMMI Calls to follow-up when she returns home since daughter and granddaughter (both nurses) will be there to look after her.  Patient voiced understanding to seek referral to [facility] care managementservicesfrom primary care provider if deemed necessaryin the future.   [facility] care management information provided for future needs that may arise.   For questions, please contact:  Dannielle Huh, BSN, RN-  [facility] Primary Care Navigator  Telephone: [phone] [place]

## 2017-05-23 NOTE — Care Management Obs Status (Signed)
MEDICARE OBSERVATION STATUS NOTIFICATION   Patient Details  Name: Charlotte [name] MRN: [id number] Date of Birth: [birth year]   Medicare Observation Status Notification Given:  Yes    Tyrique Sporn, Annamarie Major, RN 05/23/2017, 3:22 PM

## 2017-05-23 NOTE — Discharge Summary (Addendum)
Physician Discharge Summary   Patient ID: Charlotte [name] MRN: [id number] DOB/AGE: December 06, 1935 81 y.o.  Admit date: 05/21/2017 Discharge date: 05/23/2017  Primary Care Physician:  Agapito Games, MD  Discharge Diagnoses:    . HYPERTENSION, BENIGN SYSTEMIC . Diabetes mellitus type 2 in obese (HCC) . Atrial fibrillation (HCC) . GASTROESOPHAGEAL REFLUX, NO ESOPHAGITIS . Symptomatic anemia . Hyponatremia   Consults: Gastroenterology  Recommendations for Outpatient Follow-up:  1. Please repeat CBC/BMET at next visit 2. Patient started on Protonix 40 mg twice a day for 2 weeks and then 40 mg daily thereafter for 1 cm linear gastric ulcer   DIET: Carb modified diet    Allergies:   Allergies  Allergen Reactions  . Amlodipine Besylate-Valsartan Other (See Comments)    HEADACHE      DISCHARGE MEDICATIONS: Current Discharge Medication List    START taking these medications   Details  iron polysaccharides (NU-IRON) 150 MG capsule Take 1 capsule (150 mg total) by mouth daily. Qty: 30 capsule, Refills: 3   Associated Diagnoses: Osteoarthritis of both ankles    pantoprazole (PROTONIX) 40 MG tablet Take 1 tablet (40 mg total) by mouth 2 (two) times daily before a meal. Please take twice a day before meals for 2 WEEKS, then daily thereafter Qty: 60 tablet, Refills: 4      CONTINUE these medications which have CHANGED   Details  rivaroxaban (XARELTO) 20 MG TABS tablet TAKE 20 mg TABLET EVERY DAY WITH SUPPER, start on 05/25/17 Qty: 90 tablet, Refills: 3      CONTINUE these medications which have NOT CHANGED   Details  Acetaminophen 500 MG coapsule Take 500 mg by mouth every 6 (six) hours as needed for pain.     Calcium Carb-Cholecalciferol (CALCIUM 600/VITAMIN D3) 600-800 MG-UNIT TABS Take 1 tablet by mouth 2 (two) times daily.    carvedilol (COREG) 6.25 MG tablet TAKE 1 TABLET TWICE DAILY Qty: 180 tablet, Refills: 1    furosemide (LASIX) 20 MG tablet Take 20 mg by  mouth 2 (two) times daily. Except when pt's has prp treatment    gabapentin (NEURONTIN) 300 MG capsule TAKE 1 CAPSULE FOUR TIMES DAILY Qty: 360 capsule, Refills: 1    losartan (COZAAR) 100 MG tablet TAKE 1 TABLET EVERY DAY Qty: 90 tablet, Refills: 2    metFORMIN (GLUCOPHAGE) 500 MG tablet TAKE 1 TABLET TWICE DAILY WITH MEALS Qty: 180 tablet, Refills: 1    metolazone (ZAROXOLYN) 2.5 MG tablet TAKE 1 TABLET EVERY OTHER DAY Qty: 45 tablet, Refills: 3    Multiple Vitamin (MULTIVITAMIN) capsule Take 1 capsule by mouth daily.      polyvinyl alcohol-povidone (REFRESH) 1.4-0.6 % ophthalmic solution Place 1-2 drops into both eyes daily as needed (FOR DRY EYES).     potassium chloride (K-DUR,KLOR-CON) 10 MEQ tablet Take 1 tablet (10 mEq total) by mouth 2 (two) times daily. Qty: 180 tablet, Refills: 3    psyllium (HYDROCIL/METAMUCIL) 95 % PACK Take 1 packet by mouth daily.    verapamil (VERELAN PM) 240 MG 24 hr capsule TAKE 1 CAPSULE AT BEDTIME Qty: 90 capsule, Refills: 2    vitamin B-12 (CYANOCOBALAMIN) 1000 MCG tablet Take 1,000 mcg by mouth daily.           Brief H and P: For complete details please refer to admission H and P, but in briefPatient is a 81 year old female with paroxysmal Afib on xarelto, CAD, DM, and chronic diastolic CHF, hypertension, GERD presented with fatigue, dizziness, shortness of breath, fatigued over  the recent weeks and was found to have hemoglobin of 7.3 Patient reported dark stools, was found to be FOBT positive. In ED, patient was transfused 1 unit of packed RBCs.   Hospital Course:   Symptomatic anemia / GI bleed - Hemoglobin 7.3 in the ED on presentation, baseline 13.8 in June 2014, no recent lab work in chart available - Patient on xarelto for paroxysmal atrial fibrillation - Patient was placed on IV PPI and transfused. At the time of discharge hemoglobin stable and 10.1. - The patient underwent endoscopy and colonoscopy by Dr Evette Cristal, [facility] - EGD  showed normal esophagus, one linear gastric ulcer 1 cm long with no stigmata of bleeding in the gastric antrum, normal duodenum. - Colonoscopy showed diverticulosis in the sigmoid colon otherwise normal. - GI recommended iron supplement, PPI and may resume anticoagulation     HYPERTENSION, BENIGN SYSTEMIC - BP currently stable - For now continue verapamil    GASTROESOPHAGEAL REFLUX, NO ESOPHAGITIS - Placed on oral PPI twice a day for 2 weeks and then daily thereafter - Outpatient follow-up with Dr. Evette Cristal    Diabetes mellitus type 2 in obese (HCC) - Hemoglobin A1c 5.6 in May 2018 - On metformin at home, held while inpatient and placed on sliding scale insulin     Atrial fibrillation (HCC) - Continue Coreg for rate control, - Per GI may resume and decortication    Hyponatremia - Resolved  Hypokalemia - Replaced   Day of Discharge BP 119/87   Pulse 61   Temp (!) 97.2 F (36.2 C) (Axillary)   Resp 17   Ht 5' 2.5" (1.588 m)   Wt 111.1 kg (245 lb)   SpO2 100%   BMI 44.10 kg/m   Physical Exam: General: Alert and awake oriented x3 not in any acute distress. HEENT: anicteric sclera, pupils reactive to light and accommodation CVS: S1-S2 clear no murmur rubs or gallops Chest: clear to auscultation bilaterally, no wheezing rales or rhonchi Abdomen: soft nontender, nondistended, normal bowel sounds Extremities: no cyanosis, clubbing or edema noted bilaterally Neuro: Cranial nerves II-XII intact, no focal neurological deficits   The results of significant diagnostics from this hospitalization (including imaging, microbiology, ancillary and laboratory) are listed below for reference.    LAB RESULTS: Basic Metabolic Panel:  Recent Labs Lab 05/22/17 0535 05/23/17 0700  NA 136 138  K 3.1* 3.9  CL 101 107  CO2 26 21*  GLUCOSE 107* 115*  BUN 23* 10  CREATININE 0.87 0.66  CALCIUM 9.1 8.9   Liver Function Tests: No results for input(s): AST, ALT, ALKPHOS,  BILITOT, PROT, ALBUMIN in the last 168 hours. No results for input(s): LIPASE, AMYLASE in the last 168 hours. No results for input(s): AMMONIA in the last 168 hours. CBC:  Recent Labs Lab 05/21/17 1509 05/21/17 1729  05/23/17 0134 05/23/17 1012  WBC 7.0 7.5  --   --   --   HGB 7.4* 7.3*  < > 9.4* 10.1*  HCT 23.2* 24.0*  < > 29.6* 32.4*  MCV 71* 74.3*  --   --   --   PLT 440* 415*  --   --   --   < > = values in this interval not displayed. Cardiac Enzymes: No results for input(s): CKTOTAL, CKMB, CKMBINDEX, TROPONINI in the last 168 hours. BNP: Invalid input(s): POCBNP CBG:  Recent Labs Lab 05/23/17 0813 05/23/17 1202  GLUCAP 109* 126*    Significant Diagnostic Studies:  No results found.  2D ECHO:  Disposition and Follow-up: Discharge Instructions    Diet Carb Modified    Complete by:  As directed    Increase activity slowly    Complete by:  As directed        DISPOSITION:Home   DISCHARGE FOLLOW-UP Follow-up Information    Agapito Games, MD. Schedule an appointment as soon as possible for a visit in 2 week(s).   Specialty:  Family Medicine Contact information: [address] [place] [address] [phone]        Graylin Shiver, MD. Schedule an appointment as soon as possible for a visit in 4 week(s).   Specialty:  Gastroenterology Why:  GI follow-up Contact information: [address] [place] [address] [phone]            Time spent on Discharge: 35 mins   Signed:   Thad Ranger M.D. [facility] 05/23/2017, 2:16 PM Pager: [phone]   Coding query addendum Acute blood loss anemia secondary to GI bleed, gastric ulcer Hemoglobin 7.3 in ED status post transfusion of 3 units pRBCs. EGD showed normal esophagus, one linear gastric ulcer 1 cm long with no stigmata of bleeding in the gastric antrum, normal duodenum.    Thad Ranger M.D. [facility] 06/21/2017, 1:12  PM  Pager: [phone]

## 2017-05-23 NOTE — Anesthesia Postprocedure Evaluation (Signed)
Anesthesia Post Note  Patient: Charlotte [name]  Procedure(s) Performed: Procedure(s) (LRB): COLONOSCOPY WITH PROPOFOL (N/A) ESOPHAGOGASTRODUODENOSCOPY (EGD) WITH PROPOFOL (N/A)     Patient location during evaluation: PACU Anesthesia Type: MAC Level of consciousness: awake and alert Pain management: pain level controlled Vital Signs Assessment: post-procedure vital signs reviewed and stable Respiratory status: spontaneous breathing, nonlabored ventilation, respiratory function stable and patient connected to nasal cannula oxygen Cardiovascular status: stable and blood pressure returned to baseline Anesthetic complications: no    Last Vitals:  Vitals:   05/23/17 0950 05/23/17 1001  BP: (!) 144/66 119/87  Pulse: 62 61  Resp: 13 17  Temp:    SpO2: 100% 100%    Last Pain:  Vitals:   05/23/17 0940  TempSrc: Axillary  PainSc:                  Karyl Kinnier [name]

## 2017-05-23 NOTE — Op Note (Signed)
[facility] Patient Name: Charlotte [name] Procedure Date : 05/23/2017 MRN: [id number] Attending MD: Graylin Shiver , MD Date of Birth: [birth year] CSN: [id number] Age: 81 Admit Type: Inpatient Procedure:                Upper GI endoscopy Indications:              Iron deficiency anemia, Heme positive stool Providers:                Graylin Shiver, MD, Norman Clay, RN, Oletha Blend,                            Technician Referring MD:              Medicines:                Propofol per Anesthesia Complications:            No immediate complications. Estimated Blood Loss:     Estimated blood loss: none. Procedure:                Pre-Anesthesia Assessment:                           - Prior to the procedure, a History and Physical                            was performed, and patient medications and                            allergies were reviewed. The patient's tolerance of                            previous anesthesia was also reviewed. The risks                            and benefits of the procedure and the sedation                            options and risks were discussed with the patient.                            All questions were answered, and informed consent                            was obtained. Prior Anticoagulants: The patient has                            taken anticoagulant medication, last dose was 3                            days prior to procedure. ASA Grade Assessment: III                            - A patient with severe systemic disease. After  reviewing the risks and benefits, the patient was                            deemed in satisfactory condition to undergo the                            procedure.                           After obtaining informed consent, the endoscope was                            passed under direct vision. Throughout the                            procedure, the patient's blood pressure, pulse,  and                            oxygen saturations were monitored continuously. The                            [id number] ([id number]) scope was introduced through the                            mouth, and advanced to the second part of duodenum.                            The upper GI endoscopy was accomplished without                            difficulty. The patient tolerated the procedure                            well. Scope In: Scope Out: Findings:      The examined esophagus was normal.      One linear gastric ulcer 1 cm long with no stigmata of bleeding was       found in the gastric antrum.      The examined duodenum was normal. Impression:               - Normal esophagus.                           - Gastric ulcer with no stigmata of bleeding.                           - Normal examined duodenum.                           - No specimens collected. Moderate Sedation:      . Recommendation:           - Resume regular diet.                           - Continue present medications.                           -  May resume anticoagulant tomorrow. She needs to                            be on a PPI. Iron suppliment to build up HGB Procedure Code(s):        --- Professional ---                           [phone], Esophagogastroduodenoscopy, flexible,                            transoral; diagnostic, including collection of                            specimen(s) by brushing or washing, when performed                            (separate procedure) Diagnosis Code(s):        --- Professional ---                           K25.9, Gastric ulcer, unspecified as acute or                            chronic, without hemorrhage or perforation                           D50.9, Iron deficiency anemia, unspecified                           R19.5, Other fecal abnormalities CPT copyright 2016 [employer]. All rights reserved. The codes documented in this report are preliminary and upon  coder review may  be revised to meet current compliance requirements. Graylin Shiver, MD 05/23/2017 9:48:03 AM This report has been signed electronically. Number of Addenda: 0

## 2017-05-23 NOTE — Transfer of Care (Signed)
Immediate Anesthesia Transfer of Care Note  Patient: Charlotte [name]  Procedure(s) Performed: Procedure(s): COLONOSCOPY WITH PROPOFOL (N/A) ESOPHAGOGASTRODUODENOSCOPY (EGD) WITH PROPOFOL (N/A)  Patient Location: Endoscopy Unit  Anesthesia Type:MAC  Level of Consciousness: awake, alert  and oriented  Airway & Oxygen Therapy: Patient Spontanous Breathing and Patient connected to nasal cannula oxygen  Post-op Assessment: Report given to RN, Post -op Vital signs reviewed and stable and Patient moving all extremities X 4  Post vital signs: Reviewed and stable  Last Vitals:  Vitals:   05/23/17 0449 05/23/17 0831  BP: (!) 163/55 (!) 165/68  Pulse: 62   Resp: 17 15  Temp: 36.6 C 36.6 C  SpO2: 99% 100%    Last Pain:  Vitals:   05/23/17 0831  TempSrc: Oral  PainSc:       Patients Stated Pain Goal: 2 (03/55/97 4163)  Complications: No apparent anesthesia complications

## 2017-05-24 ENCOUNTER — Encounter (HOSPITAL_COMMUNITY): Payer: Self-pay | Admitting: Gastroenterology

## 2017-05-28 ENCOUNTER — Encounter: Payer: Self-pay | Admitting: *Deleted

## 2017-05-28 ENCOUNTER — Emergency Department
Admission: EM | Admit: 2017-05-28 | Discharge: 2017-05-28 | Disposition: A | Discharge status: Home / Self Care | Payer: Medicare HMO | Source: Home / Self Care | Attending: Family Medicine | Admitting: Family Medicine

## 2017-05-28 DIAGNOSIS — M25561 Pain in right knee: Secondary | ICD-10-CM

## 2017-05-28 DIAGNOSIS — M545 Low back pain, unspecified: Secondary | ICD-10-CM

## 2017-05-28 DIAGNOSIS — M25562 Pain in left knee: Secondary | ICD-10-CM

## 2017-05-28 LAB — POCT URINALYSIS DIP (MANUAL ENTRY)
Blood, UA: NEGATIVE
Glucose, UA: NEGATIVE mg/dL
Nitrite, UA: NEGATIVE
Protein Ur, POC: NEGATIVE mg/dL
Spec Grav, UA: 1.015 (ref 1.010–1.025)
Urobilinogen, UA: 0.2 E.U./dL
pH, UA: 5 (ref 5.0–8.0)

## 2017-05-28 MED ORDER — CYCLOBENZAPRINE HCL 5 MG PO TABS: 5.0000 mg | ORAL_TABLET | Freq: Three times a day (TID) | ORAL | 0 refills | Status: DC | PRN

## 2017-05-28 NOTE — ED Provider Notes (Signed)
[facility]    CSN: [id number] Arrival date & time: 05/28/17  1719     History   Chief Complaint Chief Complaint  Patient presents with  . Back Pain    HPI Charlotte [name] is a 81 y.o. female.   HPI  Charlotte [name] is a 81 y.o. female presenting to UC with her daughter c/o gradually worsening back pain that started 3 weeks ago in her upper back and has gradually moved down to her lower back and hips.  She also reports bilateral knee pain that is worse with weight bearing. She was admitted to the hospital from 05/21/17 to 05/23/17 for a GI bleed.  She was having back pain prior to being admitted.  Hx of chronic back pain and bilateral knee replacement about 10 years ago.  She has taken Tylenol and took 1 tramadol w/o relief. Pt tries not to take tramadol as it causes constipation.  She typically walks with a cane at home but since being discharged she has been using her walker.  She does report mild dysuria. denies fever, chills, n/v/d. She called her PCP for f/u due to recent admission but was advised she cannot f/u for 1 week.    Past Medical History:  Diagnosis Date  . Aortic insufficiency   . Atrial fibrillation ([place])    a. s/p DCCV 03/2013  . CAD (coronary artery disease)    LHC 06/2010: EF 55%, mild plaque in the LAD 20-30%, otherwise normal coronary arteries  . CATARACTS   . Chronic systolic heart failure ([place])    in setting of AFib;  Echocardiogram 02/2013: Moderate LVH, EF 25-30%, anteroseptal and apical HK, moderate AI, MAC, moderate MR, moderate LAE, mild RAE, PASP 32 => after DCCV f/u Echo 8/14: Moderate LVH, EF 60-65%, normal wall motion, grade 1 diastolic dysfunction, moderate AI, moderate LAE  . Complication of anesthesia 2009   nausea and vomitting  . Diabetes mellitus without complication ([place])   . GERD (gastroesophageal reflux disease)   . HYPERLIPIDEMIA, MILD   . HYPERTENSION, BENIGN SYSTEMIC   . Irritable bowel syndrome   . LBBB (left bundle branch block)    . MEDIAL EPICONDYLITIS   . NSVT (nonsustained ventricular tachycardia) ([place])    during Dob Echo 2011 => normal cath  . OSTEOARTHRITIS   . OSTEOPENIA   . RHINITIS, ALLERGIC   . SYNCOPE   . VITAMIN D DEFICIENCY     Patient Active Problem List   Diagnosis Date Noted  . Symptomatic anemia 05/21/2017  . Hyponatremia 05/21/2017  . Gastrointestinal hemorrhage   . Constipation 05/10/2015  . Atrial fibrillation ([place]) 11/10/2014  . Severe obesity (BMI >= 40) ([place]) 06/18/2014  . Aortic regurgitation 03/03/2014  . Diabetes mellitus type 2 in obese ([place]) 12/09/2013  . Long term current use of anticoagulant therapy 05/14/2013  . VITAMIN D DEFICIENCY 10/31/2010  . VENTRICULAR TACHYCARDIA 05/31/2010  . SYNCOPE 04/24/2010  . DYSPNEA 04/24/2010  . CATARACTS 04/20/2010  . EDEMA 02/28/2010  . ELECTROCARDIOGRAM, ABNORMAL 09/19/2007  . Osteoarthritis of both ankles 01/01/2007  . Hyperlipidemia 08/13/2006  . HYPERTENSION, BENIGN SYSTEMIC 07/16/2006  . RHINITIS, ALLERGIC 07/16/2006  . GASTROESOPHAGEAL REFLUX, NO ESOPHAGITIS 07/16/2006  . IRRITABLE BOWEL SYNDROME 07/16/2006  . OSTEOPENIA 07/16/2006    Past Surgical History:  Procedure Laterality Date  . CARDIOVERSION N/A 04/01/2013   Procedure: CARDIOVERSION;  Surgeon: Josue Hector, MD;  Location: [facility];  Service: Cardiovascular;  Laterality: N/A;  . CATARACT EXTRACTION, BILATERAL  2012  Dr. Herbert Deaner  . COLONOSCOPY WITH PROPOFOL N/A 05/23/2017   Procedure: COLONOSCOPY WITH PROPOFOL;  Surgeon: Wonda Horner, MD;  [facility] ENDOSCOPY;  Service: Endoscopy;  Laterality: N/A;  . ESOPHAGOGASTRODUODENOSCOPY (EGD) WITH PROPOFOL N/A 05/23/2017   Procedure: ESOPHAGOGASTRODUODENOSCOPY (EGD) WITH PROPOFOL;  Surgeon: Wonda Horner, MD;  Location: [facility] END[facility]COPY;  Service: Endoscopy;  Laterality: N/A;  . RADICAL HYSTERECTOMY    . REPLACEMENT TOTAL KNEE     11/03/07 right....... 03/29/08 left    OB History    No data available       Home  Medications    Prior to Admission medications   Medication Sig Start Date End Date Taking? Authorizing Provider  Acetaminophen 500 MG coapsule Take 500 mg by mouth every 6 (six) hours as needed for pain.  04/08/17   [provider]  Calcium Carb-Cholecalciferol (CALCIUM 600/VITAMIN D3) 600-800 MG-UNIT TABS Take 1 tablet by mouth 2 (two) times daily.    [provider]  carvedilol (COREG) 6.25 MG tablet TAKE 1 TABLET TWICE DAILY Patient taking differently: TAKE 6.25 mg TABLET TWICE DAILY 05/14/17   Josue Hector, MD  cyclobenzaprine (FLEXERIL) 5 MG tablet Take 1 tablet (5 mg total) by mouth 3 (three) times daily as needed for muscle spasms. 05/28/17   Noe Gens, PA-C  furosemide (LASIX) 20 MG tablet Take 20 mg by mouth 2 (two) times daily. Except when pt's has prp treatment    [provider]  gabapentin (NEURONTIN) 300 MG capsule TAKE 1 CAPSULE FOUR TIMES DAILY Patient taking differently: TAKE 300 mg CAPSULE Three TIMES DAILY 02/06/17   [name], MD  iron polysaccharides (NU-IRON) 150 MG capsule Take 1 capsule (150 mg total) by mouth daily. 05/23/17   Rai, Ripudeep Raliegh Ip, MD  losartan (COZAAR) 100 MG tablet TAKE 1 TABLET EVERY DAY Patient taking differently: TAKE 100 mg TABLET EVERY DAY 03/27/17   Josue Hector, MD  metFORMIN (GLUCOPHAGE) 500 MG tablet TAKE 1 TABLET TWICE DAILY WITH MEALS Patient taking differently: TAKE 500 mg TABLET TWICE DAILY WITH MEALS 05/14/17   [name], MD  metolazone (ZAROXOLYN) 2.5 MG tablet TAKE 1 TABLET EVERY OTHER DAY Patient taking differently: TAKE 2.5 mg TABLET EVERY OTHER DAY 01/28/17   Josue Hector, MD  Multiple Vitamin (MULTIVITAMIN) capsule Take 1 capsule by mouth daily.      [provider]  pantoprazole (PROTONIX) 40 MG tablet Take 1 tablet (40 mg total) by mouth 2 (two) times daily before a meal. Please take twice a day before meals for 2 WEEKS, then daily thereafter 05/23/17   Rai, Ripudeep K, MD    polyvinyl alcohol-povidone (REFRESH) 1.4-0.6 % ophthalmic solution Place 1-2 drops into both eyes daily as needed (FOR DRY EYES).     [provider]  potassium chloride (K-DUR,KLOR-CON) 10 MEQ tablet Take 1 tablet (10 mEq total) by mouth 2 (two) times daily. 12/03/16   Josue Hector, MD  psyllium (HYDROCIL/METAMUCIL) 95 % PACK Take 1 packet by mouth daily.    [provider]  rivaroxaban (XARELTO) 20 MG TABS tablet TAKE 20 mg TABLET EVERY DAY WITH SUPPER, start on 05/25/17 05/25/17   Rai, Vernelle Emerald, MD  verapamil (VERELAN PM) 240 MG 24 hr capsule TAKE 1 CAPSULE AT BEDTIME Patient taking differently: TAKE 240 mg CAPSULE AT BEDTIME 03/27/17   Josue Hector, MD  vitamin B-12 (CYANOCOBALAMIN) 1000 MCG tablet Take 1,000 mcg by mouth daily.      [provider]  Family History Family History  Problem Relation Age of Onset  . Arrhythmia Mother        afib  . Alzheimer's disease Mother   . Colon cancer Father   . Heart attack Father 32  . Diabetes Sister   . Arrhythmia Sister        afib    Social History Social History  Substance Use Topics  . Smoking status: Former Smoker    Quit date: 10/08/1970  . Smokeless tobacco: Never Used  . Alcohol use No     Allergies   Amlodipine besylate-valsartan   Review of Systems Review of Systems  Constitutional: Negative for chills and fever.  Gastrointestinal: Negative for abdominal pain, diarrhea, nausea and vomiting.  Genitourinary: Positive for dysuria. Negative for flank pain, frequency and hematuria.  Musculoskeletal: Positive for arthralgias, back pain, gait problem and myalgias. Negative for joint swelling, neck pain and neck stiffness.     Physical Exam Triage Vital Signs ED Triage Vitals  Enc Vitals Group     BP 05/28/17 1751 136/86     Pulse Rate 05/28/17 1751 63     Resp 05/28/17 1751 18     Temp 05/28/17 1751 98.5 F (36.9 C)     Temp Source 05/28/17 1751 Oral     SpO2 05/28/17 1751 94 %      Weight --      Height 05/28/17 1751 5' 4.5" (1.638 m)     Head Circumference --      Peak Flow --      Pain Score 05/28/17 1752 7     Pain Loc --      Pain Edu? --      Excl. in [place]? --    No data found.   Updated Vital Signs BP 136/86 (BP Location: Left Arm)   Pulse 63   Temp 98.5 F (36.9 C) (Oral)   Resp 18   Ht 5' 4.5" (1.638 m)   SpO2 94%     Physical Exam  Constitutional: She is oriented to person, place, and time. She appears well-developed and well-nourished. No distress.  Obese elderly female sitting in wheelchair, NAD. Walker by her side.  HENT:  Head: Normocephalic and atraumatic.  Eyes: EOM are normal.  Neck: Normal range of motion.  Cardiovascular: Normal rate.   Pulmonary/Chest: Effort normal. No respiratory distress.  Musculoskeletal: Normal range of motion. She exhibits tenderness. She exhibits no edema.  Tenderness from mid to lower back bilateral muscles. No midline spinal tenderness.  Full ROM upper extremities. Bilateral knees: no obvious edema. Non-tender. Full ROM. Calves are soft, non-tender.  Neurological: She is alert and oriented to person, place, and time.  Skin: Skin is warm and dry. She is not diaphoretic.  Bilateral knees: skin in tact. No ecchymosis or erythema.   Psychiatric: She has a normal mood and affect. Her behavior is normal.  Nursing note and vitals reviewed.    UC Treatments / Results  Labs (all labs ordered are listed, but only abnormal results are displayed) Labs Reviewed  POCT URINALYSIS DIP (MANUAL ENTRY) - Abnormal; Notable for the following:       Result Value   Clarity, UA cloudy (*)    Bilirubin, UA small (*)    Ketones, POC UA small (15) (*)    Leukocytes, UA Small (1+) (*)    All other components within normal limits  URINE CULTURE    EKG  EKG Interpretation None       Radiology No  results found.  Procedures Procedures (including critical care time)  Medications Ordered in UC Medications - No  data to display   Initial Impression / Assessment and Plan / UC Course  I have reviewed the triage vital signs and the nursing notes.  Pertinent labs & imaging results that were available during my care of the patient were reviewed by me and considered in my medical decision making (see chart for details).     UA: no definite UTI, will send culture.  Back and knee pain most c/w musculoskeletal pain, likely exacerbation of chronic pain due to recent hospital stay. No red flag symptoms. No recent falls or known injury. No imaging indicated at this time.  Final Clinical Impressions(s) / UC Diagnoses   Final diagnoses:  Acute bilateral low back pain without sciatica  Acute pain of both knees   Reassured pt and daughter no concerning findings today. Recommend alternating cool and warm compress May try Flexeril along with Tylenol  Pt on Xarelto, cannot take NSAIDs. Encouraged f/u with PCP and/or Sports Medicine as pt may benefit from PT.   New Prescriptions Discharge Medication List as of 05/28/2017  6:37 PM    START taking these medications   Details  cyclobenzaprine (FLEXERIL) 5 MG tablet Take 1 tablet (5 mg total) by mouth 3 (three) times daily as needed for muscle spasms., Starting Tue 05/28/2017, Normal         Controlled Substance Prescriptions [place] Controlled Substance Registry consulted? Not Applicable   Tyrell Antonio 05/28/17 1907

## 2017-05-28 NOTE — ED Triage Notes (Signed)
Pt c/o LBP, bilateral hip and bilateral knee pain x 3 wks. The pain began in her upper back before she was admitted to the hospital, but has since moved to her lower back. She reports a hx of chronic back pain. She applied ice today without relief. She has taken Tylenol and Tramadol without relief.

## 2017-05-28 NOTE — Discharge Instructions (Signed)
°  Flexeril (cyclobenzaprine) is a muscle relaxer and may cause drowsiness. Do not drink alcohol, drive, or operate heavy machinery while taking. ° °

## 2017-05-29 ENCOUNTER — Ambulatory Visit (INDEPENDENT_AMBULATORY_CARE_PROVIDER_SITE_OTHER): Payer: Medicare HMO | Admitting: Family Medicine

## 2017-05-29 ENCOUNTER — Ambulatory Visit (INDEPENDENT_AMBULATORY_CARE_PROVIDER_SITE_OTHER): Payer: Medicare HMO

## 2017-05-29 ENCOUNTER — Encounter: Payer: Self-pay | Admitting: Family Medicine

## 2017-05-29 VITALS — BP 107/66 | HR 74

## 2017-05-29 DIAGNOSIS — D649 Anemia, unspecified: Secondary | ICD-10-CM

## 2017-05-29 DIAGNOSIS — K254 Chronic or unspecified gastric ulcer with hemorrhage: Secondary | ICD-10-CM

## 2017-05-29 DIAGNOSIS — S39012A Strain of muscle, fascia and tendon of lower back, initial encounter: Secondary | ICD-10-CM | POA: Insufficient documentation

## 2017-05-29 DIAGNOSIS — M8588 Other specified disorders of bone density and structure, other site: Secondary | ICD-10-CM | POA: Diagnosis not present

## 2017-05-29 DIAGNOSIS — M5136 Other intervertebral disc degeneration, lumbar region: Secondary | ICD-10-CM

## 2017-05-29 DIAGNOSIS — M545 Low back pain: Secondary | ICD-10-CM | POA: Diagnosis not present

## 2017-05-29 DIAGNOSIS — K5901 Slow transit constipation: Secondary | ICD-10-CM

## 2017-05-29 DIAGNOSIS — I700 Atherosclerosis of aorta: Secondary | ICD-10-CM

## 2017-05-29 LAB — CBC
HCT: 34 % — ABNORMAL LOW (ref 35.0–45.0)
HEMOGLOBIN: 10.5 g/dL — AB (ref 11.7–15.5)
MCH: 24.6 pg — AB (ref 27.0–33.0)
MCHC: 30.9 g/dL — ABNORMAL LOW (ref 32.0–36.0)
MCV: 79.8 fL — AB (ref 80.0–100.0)
MPV: 9.9 fL (ref 7.5–12.5)
Platelets: 304 10*3/uL (ref 140–400)
RBC: 4.26 MIL/uL (ref 3.80–5.10)
RDW: 18.3 % — ABNORMAL HIGH (ref 11.0–15.0)
WBC: 9.5 10*3/uL (ref 3.8–10.8)

## 2017-05-29 MED ORDER — OXYCODONE HCL 5 MG PO TABS: ORAL_TABLET | ORAL | 0 refills | Status: DC

## 2017-05-29 NOTE — Patient Instructions (Signed)
Thank you for coming in today. You should hear from home health PT soon.  Use a heating pad and a TENS unit.  Take tylenol for pain.  Use oxycodone for severe pain.  Recheck with me in about 1-2 weeks.  Get labs and xray today.   TENS UNIT: This is helpful for muscle pain and spasm.   Search and Purchase a TENS 7000 2nd edition at  www.tenspros.com or www.Amazon.com It should be less than $30.     TENS unit instructions: Do not shower or bathe with the unit on Turn the unit off before removing electrodes or batteries If the electrodes lose stickiness add a drop of water to the electrodes after they are disconnected from the unit and place on plastic sheet. If you continued to have difficulty, call the TENS unit company to purchase more electrodes. Do not apply lotion on the skin area prior to use. Make sure the skin is clean and dry as this will help prolong the life of the electrodes. After use, always check skin for unusual red areas, rash or other skin difficulties. If there are any skin problems, does not apply electrodes to the same area. Never remove the electrodes from the unit by pulling the wires. Do not use the TENS unit or electrodes other than as directed. Do not change electrode placement without consultating your therapist or physician. Keep 2 fingers with between each electrode. [place] time ratio is 2:1, on to off times.    For example on for 30 minutes off for 15 minutes and then on for 30 minutes off for 15 minutes     Lumbosacral Strain Lumbosacral strain is an injury that causes pain in the lower back (lumbosacral spine). This injury usually occurs from overstretching the muscles or ligaments along your spine. A strain can affect one or more muscles or cord-like tissues that connect bones to other bones (ligaments). What are the causes? This condition may be caused by:  A hard, direct hit (blow) to the back.  Excessive stretching of the lower back muscles.  This may result from: ? A fall. ? Lifting something heavy. ? Repetitive movements such as bending or crouching.  What increases the risk? The following factors may increase your risk of getting this condition:  Participating in sports or activities that involve: ? A sudden twist of the back. ? Pushing or pulling motions.  Being overweight or obese.  Having poor strength and flexibility, especially tight hamstrings or weak muscles in the back or abdomen.  Having too much of a curve in the lower back.  Having a pelvis that is tilted forward.  What are the signs or symptoms? The main symptom of this condition is pain in the lower back, at the site of the strain. Pain may extend (radiate) down one or both legs. How is this diagnosed? This condition is diagnosed based on:  Your symptoms.  Your medical history.  A physical exam. ? Your health care provider may push on certain areas of your back to determine the source of your pain. ? You may be asked to bend forward, backward, and side to side to assess the severity of your pain and your range of motion.  Imaging tests, such as: ? X-rays. ? MRI.  How is this treated? Treatment for this condition may include:  Putting heat and cold on the affected area.  Medicines to help relieve pain and relax your muscles (muscle relaxants).  NSAIDs to help reduce swelling and discomfort.  When your symptoms improve, it is important to gradually return to your normal routine as soon as possible to reduce pain, avoid stiffness, and avoid loss of muscle strength. Generally, symptoms should improve within 6 weeks of treatment. However, recovery time varies. Follow these instructions at home: Managing pain, stiffness, and swelling   If directed, put ice on the injured area during the first 24 hours after your strain. ? Put ice in a plastic bag. ? Place a towel between your skin and the bag. ? Leave the ice on for 20 minutes, 2-3 times a  day.  If directed, put heat on the affected area as often as told by your health care provider. Use the heat source that your health care provider recommends, such as a moist heat pack or a heating pad. ? Place a towel between your skin and the heat source. ? Leave the heat on for 20-30 minutes. ? Remove the heat if your skin turns bright red. This is especially important if you are unable to feel pain, heat, or cold. You may have a greater risk of getting burned. Activity  Rest and return to your normal activities as told by your health care provider. Ask your health care provider what activities are safe for you.  Avoid activities that take a lot of energy for as long as told by your health care provider. General instructions  Take over-the-counter and prescription medicines only as told by your health care provider.  Donot drive or use heavy machinery while taking prescription pain medicine.  Do not use any products that contain nicotine or tobacco, such as cigarettes and e-cigarettes. If you need help quitting, ask your health care provider.  Keep all follow-up visits as told by your health care provider. This is important. How is this prevented?  Use correct form when playing sports and lifting heavy objects.  Use good posture when sitting and standing.  Maintain a healthy weight.  Sleep on a mattress with medium firmness to support your back.  Be safe and responsible while being active to avoid falls.  Do at least 150 minutes of moderate-intensity exercise each week, such as brisk walking or water aerobics. Try a form of exercise that takes stress off your back, such as swimming or stationary cycling.  Maintain physical fitness, including: ? Strength. ? Flexibility. ? Cardiovascular fitness. ? Endurance. Contact a health care provider if:  Your back pain does not improve after 6 weeks of treatment.  Your symptoms get worse. Get help right away if:  Your back pain  is severe.  You cannot stand or walk.  You have difficulty controlling when you urinate or when you have a bowel movement.  You feel nauseous or you vomit.  Your feet get very cold.  You have numbness, tingling, weakness, or problems using your arms or legs.  You develop any of the following: ? Shortness of breath. ? Dizziness. ? Pain in your legs. ? Weakness in your buttocks or legs. ? Discoloration of the skin on your toes or legs. This information is not intended to replace advice given to you by your health care provider. Make sure you discuss any questions you have with your health care provider. Document Released: 07/04/2005 Document Revised: 04/13/2016 Document Reviewed: 02/26/2016 Elsevier Interactive Patient Education  2017 [employer].

## 2017-05-30 DIAGNOSIS — I700 Atherosclerosis of aorta: Secondary | ICD-10-CM | POA: Insufficient documentation

## 2017-05-30 NOTE — Progress Notes (Signed)
Charlotte [name] is a 81 y.o. female who presents to [facility] today for back pain. Patient notes a recent episode of back pain. This is been ongoing now for a few weeks. She was recently in the hospital on August 14 after suffering a gastric ulcer GI bleed. Her pain worsened since then. She was seen in urgent care on the 21st where she was thought to have a myofascial strain. She was prescribed cyclobenzaprine which has not been helpful. She notes pain is predominantly located in the bilateral low back. She denies any significant radiating pain or new weakness or numbness. The pain is severe at times. She denies any pain along the midline. She feels well otherwise with no fevers or chills.  She denies any bloody vomiting or melanotic stool.   Past Medical History:  Diagnosis Date  . Aortic insufficiency   . Atrial fibrillation ([place])    a. s/p DCCV 03/2013  . CAD (coronary artery disease)    LHC 06/2010: EF 55%, mild plaque in the LAD 20-30%, otherwise normal coronary arteries  . CATARACTS   . Chronic systolic heart failure ([place])    in setting of AFib;  Echocardiogram 02/2013: Moderate LVH, EF 25-30%, anteroseptal and apical HK, moderate AI, MAC, moderate MR, moderate LAE, mild RAE, PASP 32 => after DCCV f/u Echo 8/14: Moderate LVH, EF 60-65%, normal wall motion, grade 1 diastolic dysfunction, moderate AI, moderate LAE  . Complication of anesthesia 2009   nausea and vomitting  . Diabetes mellitus without complication ([place])   . GERD (gastroesophageal reflux disease)   . HYPERLIPIDEMIA, MILD   . HYPERTENSION, BENIGN SYSTEMIC   . Irritable bowel syndrome   . LBBB (left bundle branch block)   . MEDIAL EPICONDYLITIS   . NSVT (nonsustained ventricular tachycardia) ([place])    during Dob Echo 2011 => normal cath  . OSTEOARTHRITIS   . OSTEOPENIA   . RHINITIS, ALLERGIC   . SYNCOPE   . VITAMIN D DEFICIENCY    Past Surgical History:  Procedure Laterality  Date  . CARDIOVERSION N/A 04/01/2013   Procedure: CARDIOVERSION;  Surgeon: Josue Hector, MD;  Location: [facility];  Service: Cardiovascular;  Laterality: N/A;  . CATARACT EXTRACTION, BILATERAL  2012   Dr. Herbert Deaner  . COLONOSCOPY WITH PROPOFOL N/A 05/23/2017   Procedure: COLONOSCOPY WITH PROPOFOL;  Surgeon: Wonda Horner, MD;  Location[facility]COPY;  Service: Endoscopy;  Laterality: N/A;  . ESOPHAGOGASTRODUODENOSCOPY (EGD) WITH PROPOFOL N/A 05/23/2017   Procedure: ESOPHAGOGASTRODUODENOSCOPY (EGD) WITH PROPOFOL;  Surgeon: Wonda Horner, MD;  Location: [facility] ENDOSCOPY;  Service: Endoscopy;  Laterality: N/A;  . RADICAL HYSTERECTOMY    . REPLACEMENT TOTAL KNEE     11/03/07 right....... 03/29/08 left   Social History  Substance Use Topics  . Smoking status: Former Smoker    Quit date: 10/08/1970  . Smokeless tobacco: Never Used  . Alcohol use No     ROS:  As above   Medications: Current Outpatient Prescriptions  Medication Sig Dispense Refill  . Acetaminophen 500 MG coapsule Take 500 mg by mouth every 6 (six) hours as needed for pain.     . Calcium Carb-Cholecalciferol (CALCIUM 600/VITAMIN D3) 600-800 MG-UNIT TABS Take 1 tablet by mouth 2 (two) times daily.    . carvedilol (COREG) 6.25 MG tablet TAKE 1 TABLET TWICE DAILY (Patient taking differently: TAKE 6.25 mg TABLET TWICE DAILY) 180 tablet 1  . cyclobenzaprine (FLEXERIL) 5 MG tablet Take 1 tablet (5 mg total)  by mouth 3 (three) times daily as needed for muscle spasms. 15 tablet 0  . furosemide (LASIX) 20 MG tablet Take 20 mg by mouth 2 (two) times daily. Except when pt's has prp treatment    . gabapentin (NEURONTIN) 300 MG capsule TAKE 1 CAPSULE FOUR TIMES DAILY (Patient taking differently: TAKE 300 mg CAPSULE Three TIMES DAILY) 360 capsule 1  . iron polysaccharides (NU-IRON) 150 MG capsule Take 1 capsule (150 mg total) by mouth daily. 30 capsule 3  . losartan (COZAAR) 100 MG tablet TAKE 1 TABLET EVERY DAY (Patient taking differently:  TAKE 100 mg TABLET EVERY DAY) 90 tablet 2  . metFORMIN (GLUCOPHAGE) 500 MG tablet TAKE 1 TABLET TWICE DAILY WITH MEALS (Patient taking differently: TAKE 500 mg TABLET TWICE DAILY WITH MEALS) 180 tablet 1  . metolazone (ZAROXOLYN) 2.5 MG tablet TAKE 1 TABLET EVERY OTHER DAY (Patient taking differently: TAKE 2.5 mg TABLET EVERY OTHER DAY) 45 tablet 3  . Multiple Vitamin (MULTIVITAMIN) capsule Take 1 capsule by mouth daily.      . pantoprazole (PROTONIX) 40 MG tablet Take 1 tablet (40 mg total) by mouth 2 (two) times daily before a meal. Please take twice a day before meals for 2 WEEKS, then daily thereafter 60 tablet 4  . polyvinyl alcohol-povidone (REFRESH) 1.4-0.6 % ophthalmic solution Place 1-2 drops into both eyes daily as needed (FOR DRY EYES).     . potassium chloride (K-DUR,KLOR-CON) 10 MEQ tablet Take 1 tablet (10 mEq total) by mouth 2 (two) times daily. 180 tablet 3  . psyllium (HYDROCIL/METAMUCIL) 95 % PACK Take 1 packet by mouth daily.    . rivaroxaban (XARELTO) 20 MG TABS tablet TAKE 20 mg TABLET EVERY DAY WITH SUPPER, start on 05/25/17 90 tablet 3  . verapamil (VERELAN PM) 240 MG 24 hr capsule TAKE 1 CAPSULE AT BEDTIME (Patient taking differently: TAKE 240 mg CAPSULE AT BEDTIME) 90 capsule 2  . vitamin B-12 (CYANOCOBALAMIN) 1000 MCG tablet Take 1,000 mcg by mouth daily.      Marland Kitchen oxyCODONE (ROXICODONE) 5 MG immediate release tablet Take 1/2 to 1 pill every 6 hours as needed for severe pain. 15 tablet 0   No current facility-administered medications for this visit.    Allergies  Allergen Reactions  . Amlodipine Besylate-Valsartan Other (See Comments)    HEADACHE      Exam:  BP 107/66   Pulse 74  General: Well Developed, well nourished, and in no acute distress. Patient seated in wheelchair Neuro/Psych: Alert and oriented x3, extra-ocular muscles intact, able to move all 4 extremities, sensation grossly intact. Skin: Warm and dry, no rashes noted.  Respiratory: Not using accessory  muscles, speaking in full sentences, trachea midline.  Cardiovascular: Pulses palpable, no extremity edema. Abdomen: Does not appear distended. MSK: Lumbar and thoracic spine nontender to spinal midline. Tender to palpation bilateral lumbar paraspinal muscles. Normal back motion. Lower extremity sensation and reflexes are equal and normal throughout. Standing leg strength was not assessed.  X-ray lumbar spine: No obvious vertebral compression fracture. Significant degenerative change present. Air-filled loops of bowel present. Aortic arteriosclerosis also seen on x-ray.  Awaiting formal radiology review.     Results for orders placed or performed in visit on 05/29/17 (from the past 48 hour(s))  CBC     Status: Abnormal   Collection Time: 05/29/17  3:43 PM  Result Value Ref Range   WBC 9.5 3.8 - 10.8 K/uL   RBC 4.26 3.80 - 5.10 MIL/uL   Hemoglobin 10.5 (L)  11.7 - 15.5 g/dL   HCT 34.0 (L) 35.0 - 45.0 %   MCV 79.8 (L) 80.0 - 100.0 fL   MCH 24.6 (L) 27.0 - 33.0 pg   MCHC 30.9 (L) 32.0 - 36.0 g/dL   RDW 18.3 (H) 11.0 - 15.0 %   Platelets 304 140 - 400 K/uL   MPV 9.9 7.5 - 12.5 fL   No results found.    Assessment and Plan: 81 y.o. female with  Low back pain likely myofascial disruption. Plan for home health physical therapy acetaminophen and intermittent infrequent low-dose oxycodone. We had a lengthy discussion about risks of this medication including confusion falls and constipation. Plan to recheck in 1-2 weeks. Discussed precautions. Recommend against cyclobenzaprine as it is not helpful at this time.   Anemia due to GI bleed: Hemoglobin is stable compared to one week ago. Continue current regimen.  Air-filled loops of bowel: Likely constipation related. Awaiting radiology review. Doubtful for small bowel obstruction.  Obesity is a factor limiting mobility  Aortic arteriosclerosis seen on x-ray. We'll address with PCP in the near future.  Orders Placed This Encounter    Procedures  . DG Lumbar Spine Complete    Standing Status:   Future    Number of Occurrences:   1    Standing Expiration Date:   07/29/2018    Order Specific Question:   Reason for Exam (SYMPTOM  OR DIAGNOSIS REQUIRED)    Answer:   eval lumbar pain ? compression fracture    Order Specific Question:   Preferred imaging location?    Answer:   [facility]    Order Specific Question:   Radiology Contrast Protocol - do NOT remove file path    Answer:   [email or web address]  . CBC  . Ambulatory referral to Home Health    Referral Priority:   Routine    Referral Type:   Home Health Care    Referral Reason:   Specialty Services Required    Requested Specialty:   [facility]    Number of Visits Requested:   1   Meds ordered this encounter  Medications  . oxyCODONE (ROXICODONE) 5 MG immediate release tablet    Sig: Take 1/2 to 1 pill every 6 hours as needed for severe pain.    Dispense:  15 tablet    Refill:  0    Discussed warning signs or symptoms. Please see discharge instructions. Patient expresses understanding.

## 2017-05-31 ENCOUNTER — Telehealth: Payer: Self-pay | Admitting: *Deleted

## 2017-05-31 ENCOUNTER — Telehealth: Payer: Self-pay

## 2017-05-31 DIAGNOSIS — I4891 Unspecified atrial fibrillation: Secondary | ICD-10-CM | POA: Diagnosis not present

## 2017-05-31 DIAGNOSIS — Z7982 Long term (current) use of aspirin: Secondary | ICD-10-CM | POA: Diagnosis not present

## 2017-05-31 DIAGNOSIS — I2510 Atherosclerotic heart disease of native coronary artery without angina pectoris: Secondary | ICD-10-CM | POA: Diagnosis not present

## 2017-05-31 DIAGNOSIS — I5020 Unspecified systolic (congestive) heart failure: Secondary | ICD-10-CM | POA: Diagnosis not present

## 2017-05-31 DIAGNOSIS — M545 Low back pain: Secondary | ICD-10-CM | POA: Diagnosis not present

## 2017-05-31 DIAGNOSIS — K219 Gastro-esophageal reflux disease without esophagitis: Secondary | ICD-10-CM | POA: Diagnosis not present

## 2017-05-31 DIAGNOSIS — I110 Hypertensive heart disease with heart failure: Secondary | ICD-10-CM | POA: Diagnosis not present

## 2017-05-31 LAB — URINE CULTURE

## 2017-05-31 MED ORDER — AMBULATORY NON FORMULARY MEDICATION: 0 refills | Status: DC

## 2017-05-31 MED ORDER — AMOXICILLIN 500 MG PO CAPS: 500.0000 mg | ORAL_CAPSULE | Freq: Three times a day (TID) | ORAL | 0 refills | Status: DC

## 2017-05-31 NOTE — Telephone Encounter (Signed)
Daughter, [name], called, mother fell twice during the night.  In one of the falls, she fell on her walker and bent the walker to where it doesn't work anymore.  [facility] is coming today at 1:45 to evaluate her.  Daughter is trying to talk mom into a rehab for several weeks to get her strength back, but mom is resistant.  [name] is just concerned because she can not be there with her all the time.

## 2017-05-31 NOTE — Telephone Encounter (Signed)
Dr Cathren Harsh,  Please review UCX. Patient will need an antibiotic.

## 2017-05-31 NOTE — Telephone Encounter (Signed)
I discussed with Daughter the plan.  I recommend SNF for rehab.  Return to clinic if needed

## 2017-05-31 NOTE — Telephone Encounter (Signed)
Patient notified of UCX results. Advised Dr. Cathren Harsh will prescribed antibiotic. Please complete entire course.   Per Dr. Cathren Harsh, call in Amoxicillin 500mg  1 q8h x 7 days. #21. Sent to [facility] per patient request.b

## 2017-06-01 ENCOUNTER — Emergency Department (HOSPITAL_COMMUNITY)
Admission: EM | Admit: 2017-06-01 | Discharge: 2017-06-01 | Disposition: A | Discharge status: Home / Self Care | Payer: Medicare HMO | Source: Home / Self Care | Attending: Emergency Medicine | Admitting: Emergency Medicine

## 2017-06-01 ENCOUNTER — Encounter (HOSPITAL_COMMUNITY): Payer: Self-pay | Admitting: Nurse Practitioner

## 2017-06-01 ENCOUNTER — Emergency Department (HOSPITAL_COMMUNITY): Payer: Medicare HMO

## 2017-06-01 ENCOUNTER — Inpatient Hospital Stay (HOSPITAL_COMMUNITY)
Admission: EM | Admit: 2017-06-01 | Discharge: 2017-06-07 | DRG: 516 | Disposition: A | Discharge status: Skilled Nursing Facility | Payer: Medicare HMO | Attending: Internal Medicine | Admitting: Internal Medicine

## 2017-06-01 DIAGNOSIS — E876 Hypokalemia: Secondary | ICD-10-CM | POA: Diagnosis present

## 2017-06-01 DIAGNOSIS — R0902 Hypoxemia: Secondary | ICD-10-CM | POA: Diagnosis not present

## 2017-06-01 DIAGNOSIS — E119 Type 2 diabetes mellitus without complications: Secondary | ICD-10-CM

## 2017-06-01 DIAGNOSIS — Z79899 Other long term (current) drug therapy: Secondary | ICD-10-CM

## 2017-06-01 DIAGNOSIS — I110 Hypertensive heart disease with heart failure: Secondary | ICD-10-CM

## 2017-06-01 DIAGNOSIS — K5900 Constipation, unspecified: Secondary | ICD-10-CM | POA: Diagnosis not present

## 2017-06-01 DIAGNOSIS — R140 Abdominal distension (gaseous): Secondary | ICD-10-CM | POA: Diagnosis not present

## 2017-06-01 DIAGNOSIS — N183 Chronic kidney disease, stage 3 (moderate): Secondary | ICD-10-CM | POA: Diagnosis present

## 2017-06-01 DIAGNOSIS — M4856XA Collapsed vertebra, not elsewhere classified, lumbar region, initial encounter for fracture: Principal | ICD-10-CM | POA: Diagnosis present

## 2017-06-01 DIAGNOSIS — S3992XA Unspecified injury of lower back, initial encounter: Secondary | ICD-10-CM | POA: Diagnosis not present

## 2017-06-01 DIAGNOSIS — I5042 Chronic combined systolic (congestive) and diastolic (congestive) heart failure: Secondary | ICD-10-CM | POA: Diagnosis present

## 2017-06-01 DIAGNOSIS — E669 Obesity, unspecified: Secondary | ICD-10-CM | POA: Diagnosis not present

## 2017-06-01 DIAGNOSIS — R2689 Other abnormalities of gait and mobility: Secondary | ICD-10-CM | POA: Diagnosis not present

## 2017-06-01 DIAGNOSIS — S32039A Unspecified fracture of third lumbar vertebra, initial encounter for closed fracture: Secondary | ICD-10-CM

## 2017-06-01 DIAGNOSIS — I482 Chronic atrial fibrillation: Secondary | ICD-10-CM | POA: Diagnosis present

## 2017-06-01 DIAGNOSIS — D649 Anemia, unspecified: Secondary | ICD-10-CM | POA: Diagnosis not present

## 2017-06-01 DIAGNOSIS — Z9842 Cataract extraction status, left eye: Secondary | ICD-10-CM

## 2017-06-01 DIAGNOSIS — M545 Low back pain, unspecified: Secondary | ICD-10-CM | POA: Diagnosis present

## 2017-06-01 DIAGNOSIS — R64 Cachexia: Secondary | ICD-10-CM | POA: Diagnosis present

## 2017-06-01 DIAGNOSIS — S39012A Strain of muscle, fascia and tendon of lower back, initial encounter: Secondary | ICD-10-CM | POA: Diagnosis present

## 2017-06-01 DIAGNOSIS — Z7984 Long term (current) use of oral hypoglycemic drugs: Secondary | ICD-10-CM

## 2017-06-01 DIAGNOSIS — Z96651 Presence of right artificial knee joint: Secondary | ICD-10-CM | POA: Diagnosis present

## 2017-06-01 DIAGNOSIS — I480 Paroxysmal atrial fibrillation: Secondary | ICD-10-CM | POA: Diagnosis present

## 2017-06-01 DIAGNOSIS — N390 Urinary tract infection, site not specified: Secondary | ICD-10-CM | POA: Diagnosis present

## 2017-06-01 DIAGNOSIS — E1169 Type 2 diabetes mellitus with other specified complication: Secondary | ICD-10-CM | POA: Diagnosis not present

## 2017-06-01 DIAGNOSIS — W19XXXA Unspecified fall, initial encounter: Secondary | ICD-10-CM | POA: Diagnosis not present

## 2017-06-01 DIAGNOSIS — E1122 Type 2 diabetes mellitus with diabetic chronic kidney disease: Secondary | ICD-10-CM | POA: Diagnosis present

## 2017-06-01 DIAGNOSIS — I130 Hypertensive heart and chronic kidney disease with heart failure and stage 1 through stage 4 chronic kidney disease, or unspecified chronic kidney disease: Secondary | ICD-10-CM | POA: Diagnosis present

## 2017-06-01 DIAGNOSIS — I10 Essential (primary) hypertension: Secondary | ICD-10-CM | POA: Diagnosis not present

## 2017-06-01 DIAGNOSIS — K219 Gastro-esophageal reflux disease without esophagitis: Secondary | ICD-10-CM | POA: Diagnosis present

## 2017-06-01 DIAGNOSIS — Z66 Do not resuscitate: Secondary | ICD-10-CM | POA: Diagnosis present

## 2017-06-01 DIAGNOSIS — M48061 Spinal stenosis, lumbar region without neurogenic claudication: Secondary | ICD-10-CM | POA: Diagnosis present

## 2017-06-01 DIAGNOSIS — M549 Dorsalgia, unspecified: Secondary | ICD-10-CM | POA: Diagnosis not present

## 2017-06-01 DIAGNOSIS — I5022 Chronic systolic (congestive) heart failure: Secondary | ICD-10-CM | POA: Insufficient documentation

## 2017-06-01 DIAGNOSIS — Z7901 Long term (current) use of anticoagulants: Secondary | ICD-10-CM | POA: Diagnosis not present

## 2017-06-01 DIAGNOSIS — M5432 Sciatica, left side: Secondary | ICD-10-CM

## 2017-06-01 DIAGNOSIS — R296 Repeated falls: Secondary | ICD-10-CM | POA: Diagnosis present

## 2017-06-01 DIAGNOSIS — K922 Gastrointestinal hemorrhage, unspecified: Secondary | ICD-10-CM | POA: Diagnosis not present

## 2017-06-01 DIAGNOSIS — E1121 Type 2 diabetes mellitus with diabetic nephropathy: Secondary | ICD-10-CM | POA: Diagnosis present

## 2017-06-01 DIAGNOSIS — M5489 Other dorsalgia: Secondary | ICD-10-CM | POA: Diagnosis not present

## 2017-06-01 DIAGNOSIS — Z87891 Personal history of nicotine dependence: Secondary | ICD-10-CM

## 2017-06-01 DIAGNOSIS — Z888 Allergy status to other drugs, medicaments and biological substances status: Secondary | ICD-10-CM

## 2017-06-01 DIAGNOSIS — D638 Anemia in other chronic diseases classified elsewhere: Secondary | ICD-10-CM | POA: Diagnosis present

## 2017-06-01 DIAGNOSIS — E1142 Type 2 diabetes mellitus with diabetic polyneuropathy: Secondary | ICD-10-CM | POA: Diagnosis present

## 2017-06-01 DIAGNOSIS — R278 Other lack of coordination: Secondary | ICD-10-CM | POA: Diagnosis not present

## 2017-06-01 DIAGNOSIS — S32030S Wedge compression fracture of third lumbar vertebra, sequela: Secondary | ICD-10-CM | POA: Diagnosis not present

## 2017-06-01 DIAGNOSIS — B952 Enterococcus as the cause of diseases classified elsewhere: Secondary | ICD-10-CM | POA: Diagnosis present

## 2017-06-01 DIAGNOSIS — Z9841 Cataract extraction status, right eye: Secondary | ICD-10-CM

## 2017-06-01 DIAGNOSIS — I2510 Atherosclerotic heart disease of native coronary artery without angina pectoris: Secondary | ICD-10-CM | POA: Diagnosis present

## 2017-06-01 DIAGNOSIS — Z6841 Body Mass Index (BMI) 40.0 and over, adult: Secondary | ICD-10-CM

## 2017-06-01 DIAGNOSIS — S32030A Wedge compression fracture of third lumbar vertebra, initial encounter for closed fracture: Secondary | ICD-10-CM | POA: Diagnosis present

## 2017-06-01 DIAGNOSIS — I4891 Unspecified atrial fibrillation: Secondary | ICD-10-CM | POA: Diagnosis present

## 2017-06-01 LAB — CBC WITH DIFFERENTIAL/PLATELET
BASOS PCT: 0 %
Basophils Absolute: 0 10*3/uL (ref 0.0–0.1)
EOS ABS: 0.1 10*3/uL (ref 0.0–0.7)
EOS PCT: 2 %
HCT: 31.3 % — ABNORMAL LOW (ref 36.0–46.0)
Hemoglobin: 9.9 g/dL — ABNORMAL LOW (ref 12.0–15.0)
LYMPHS ABS: 1.7 10*3/uL (ref 0.7–4.0)
Lymphocytes Relative: 23 %
MCH: 24.5 pg — AB (ref 26.0–34.0)
MCHC: 31.6 g/dL (ref 30.0–36.0)
MCV: 77.5 fL — ABNORMAL LOW (ref 78.0–100.0)
MONOS PCT: 9 %
Monocytes Absolute: 0.7 10*3/uL (ref 0.1–1.0)
Neutro Abs: 5.1 10*3/uL (ref 1.7–7.7)
Neutrophils Relative %: 66 %
PLATELETS: 244 10*3/uL (ref 150–400)
RBC: 4.04 MIL/uL (ref 3.87–5.11)
RDW: 18.3 % — ABNORMAL HIGH (ref 11.5–15.5)
WBC: 7.6 10*3/uL (ref 4.0–10.5)

## 2017-06-01 LAB — BASIC METABOLIC PANEL
Anion gap: 8 (ref 5–15)
BUN: 39 mg/dL — AB (ref 6–20)
CO2: 32 mmol/L (ref 22–32)
CREATININE: 0.82 mg/dL (ref 0.44–1.00)
Calcium: 9.2 mg/dL (ref 8.9–10.3)
Chloride: 93 mmol/L — ABNORMAL LOW (ref 101–111)
GFR calc Af Amer: >60 mL/min (ref >60)
Glucose, Bld: 128 mg/dL — ABNORMAL HIGH (ref 65–99)
Potassium: 2.6 mmol/L — CL (ref 3.5–5.1)
SODIUM: 133 mmol/L — AB (ref 135–145)

## 2017-06-01 LAB — URINALYSIS, ROUTINE W REFLEX MICROSCOPIC
BILIRUBIN URINE: NEGATIVE
Glucose, UA: NEGATIVE mg/dL
HGB URINE DIPSTICK: NEGATIVE
Ketones, ur: NEGATIVE mg/dL
Leukocytes, UA: NEGATIVE
NITRITE: NEGATIVE
PROTEIN: NEGATIVE mg/dL
pH: 6.5 (ref 5.0–8.0)

## 2017-06-01 LAB — MAGNESIUM: MAGNESIUM: 1.8 mg/dL (ref 1.7–2.4)

## 2017-06-01 MED ORDER — MORPHINE SULFATE (PF) 4 MG/ML IV SOLN
4.0000 mg | Freq: Once | INTRAVENOUS | Status: AC
  Administered 2017-06-01: 4 mg via INTRAMUSCULAR
  Filled 2017-06-01: qty 1

## 2017-06-01 MED ORDER — POTASSIUM CHLORIDE CRYS ER 20 MEQ PO TBCR
40.0000 meq | EXTENDED_RELEASE_TABLET | Freq: Once | ORAL | Status: AC
  Administered 2017-06-01: 40 meq via ORAL
  Filled 2017-06-01: qty 2

## 2017-06-01 MED ORDER — POTASSIUM CHLORIDE 10 MEQ/100ML IV SOLN
10.0000 meq | INTRAVENOUS | Status: AC
  Administered 2017-06-01 (×2): 10 meq via INTRAVENOUS
  Filled 2017-06-01: qty 100

## 2017-06-01 MED ORDER — MORPHINE SULFATE (PF) 4 MG/ML IV SOLN
4.0000 mg | Freq: Once | INTRAVENOUS | Status: AC
Start: 2017-06-01 — End: 2017-06-01
  Administered 2017-06-01: 4 mg via INTRAVENOUS
  Filled 2017-06-01: qty 1

## 2017-06-01 NOTE — H&P (Addendum)
Charlotte [name] UDJ:[id number] DOB: 1936/04/15 DOA: 06/01/2017     PCP: [name], MD   Outpatient Specialists: Wolfgang Phoenix Patient coming from:    home Lives alone,      Chief Complaint: Back pain  HPI: Charlotte [name] is a 81 y.o. female with medical history significant of paroxysmal Afibon xarelto, CAD, DM,and chronic diastolic CHF, hypertension, GERD, spinal stenosis    Presented with  4 days ago patient presented to emergency department complaining of bilateral hip and knee pain for 3 weeks the pain has been traveling up and down her spine she has chronic history of back pain which preceded previous admission Tylenol and tramadol at home with no relief. She's been having trouble ambulating and was seen in emergency department on 21st for similar complaints. Followed up with her PCP on 22nd from August had lumbar spine films done that showed no vertebral compression fracture but significant degenerative changes.  Yesterday patient have suffered 2 falls during the night one of the falls was significant enough to bend the walker which is currently not functional anymore. This morning patient presented to emergency department secondary to significant back pain worsened by the fall. Patient endorses dysuria or abdominal pain and frequent urination Social work consult was placed the patient was unable to be placed for rehabilitation from ER she was discharged to home with home health services but was unable to get off the stretcher on her arrival to home at which point she was brought back to emergency department Patient denies any fevers or chills no weakness no urinary complaints to suggest incontinence   She was using a diagnosed with UTI and started amoxicillin. Review of records show urine culture growing  Enterococcus species    Not Specified    AMPICILLIN <=2 "><=2  Sensitive    LEVOFLOXACIN 0.5  Sensitive    NITROFURANTOIN <=16 "><=16  Sensitive    TETRACYCLINE >=16   Resistant    VANCOMYCIN 1  Sensitive     Patient has known history of intermittent back pain last MRI in 2013 at [facility] showed mild spinal stenosis at L1-L2 and degenerative spine Z [place] stasis L4-5 due to severe facet arthritis with mild spinal stenosis.   Patient recently admitted for GI lead with hemoglobin of 7.3 requiring 1 unit of packed red blood cells patient underwent endoscopy and colonoscopy by Dr Penelope Coop, [facility]   EGD showed normal esophagus, one linear gastric ulcer 1 cm long with no stigmata of bleeding in the gastric antrum, normal duodenum.  Colonoscopy showed diverticulosis in the sigmoid colon otherwise normal.  GI recommended iron supplement, PPI and may resume anticoagulation. Patient was discharged home on 16 of August  Regarding pertinent Chronic problems:  History of diabetes mellitus on metformin at home he last hemoglobin A1c 5.6 in May 2018 History of atrial fibrillation on Coreg for rate control and Xarelto for anticoagulation. Last echogram May 2017 showing preserved EF grade 1 diastolic dysfunction IN ER:  Temp (24hrs), Avg:98.2 F (36.8 C), Min:97.8 F (36.6 C), Max:98.7 F (37.1 C)      on arrival  ED Triage Vitals  Enc Vitals Group     BP 06/01/17 2026 (!) 117/57     Pulse Rate 06/01/17 2026 74     Resp 06/01/17 2026 18     Temp 06/01/17 2026 97.9 F (36.6 C)     Temp Source 06/01/17 2026 Oral     SpO2 06/01/17 2026 94 %  Weight --      Height --      Head Circumference --      Peak Flow --      Pain Score 06/01/17 2019 10     Pain Loc --      Pain Edu? --      Excl. in [place]? --     Latest RR 14 satting 94% HR 77 BP 117/57 Earlier today Na 133 K2.6   was replaced in ER magnesium 1.8 glucose 128 BUN 39 creatinine 0.82    WBC 7.6 hemoglobin 9.9 close to baseline Repeat imaging today showed scoliosis and degenerative changes but no acute fracture  KUB was significant for increased stool burden  Following Medications were ordered in  ER: Medications  morphine 4 MG/ML injection 4 mg (4 mg Intramuscular Given 06/01/17 2140)    Hospitalist was called for admission for Uncontrollable low back pain  Review of Systems:    Pertinent positives include: fatigue, lower backpain  Constitutional:  No weight loss, night sweats, Fevers, chills,  weight loss  HEENT:  No headaches, Difficulty swallowing,Tooth/dental problems,Sore throat,  No sneezing, itching, ear ache, nasal congestion, post nasal drip,  Cardio-vascular:  No chest pain, Orthopnea, PND, anasarca, dizziness, palpitations.no Bilateral lower extremity swelling  GI:  No heartburn, indigestion, abdominal pain, nausea, vomiting, diarrhea, change in bowel habits, loss of appetite, melena, blood in stool, hematemesis Resp:  no shortness of breath at rest. No dyspnea on exertion, No excess mucus, no productive cough, No non-productive cough, No coughing up of blood.No change in color of mucus.No wheezing. Skin:  no rash or lesions. No jaundice GU:  no dysuria, change in color of urine, no urgency or frequency. No straining to urinate.  No flank pain.  Musculoskeletal:  No joint pain or no joint swelling. No decreased range of motion. No back pain.  Psych:  No change in mood or affect. No depression or anxiety. No memory loss.  Neuro: no localizing neurological complaints, no tingling, no weakness, no double vision, no gait abnormality, no slurred speech, no confusion  As per HPI otherwise 10 point review of systems negative.   Past Medical History: Past Medical History:  Diagnosis Date  . Aortic insufficiency   . Atrial fibrillation ([place])    a. s/p DCCV 03/2013  . CAD (coronary artery disease)    LHC 06/2010: EF 55%, mild plaque in the LAD 20-30%, otherwise normal coronary arteries  . CATARACTS   . Chronic systolic heart failure ([place])    in setting of AFib;  Echocardiogram 02/2013: Moderate LVH, EF 25-30%, anteroseptal and apical HK, moderate AI, MAC, moderate MR,  moderate LAE, mild RAE, PASP 32 => after DCCV f/u Echo 8/14: Moderate LVH, EF 60-65%, normal wall motion, grade 1 diastolic dysfunction, moderate AI, moderate LAE  . Complication of anesthesia 2009   nausea and vomitting  . Diabetes mellitus without complication ([place])   . GERD (gastroesophageal reflux disease)   . HYPERLIPIDEMIA, MILD   . HYPERTENSION, BENIGN SYSTEMIC   . Irritable bowel syndrome   . LBBB (left bundle branch block)   . MEDIAL EPICONDYLITIS   . NSVT (nonsustained ventricular tachycardia) ([place])    during Dob Echo 2011 => normal cath  . OSTEOARTHRITIS   . OSTEOPENIA   . RHINITIS, ALLERGIC   . SYNCOPE   . VITAMIN D DEFICIENCY    Past Surgical History:  Procedure Laterality Date  . CARDIOVERSION N/A 04/01/2013   Procedure: CARDIOVERSION;  Surgeon: Josue Hector, MD;  Location: [facility] ENDOSCOPY;  Service: Cardiovascular;  Laterality: N/A;  . CATARACT EXTRACTION, BILATERAL  2012   Dr. Herbert Deaner  . COLONOSCOPY WITH PROPOFOL N/A 05/23/2017   Procedure: COLONOSCOPY WITH PROPOFOL;  Surgeon: Wonda Horner, MD;  Location[facility] ENDOSCOPY;  Service: Endoscopy;  Laterality: N/A;  . ESOPHAGOGASTRODUODENOSCOPY (EGD) WITH PROPOFOL N/A 05/23/2017   Procedure: ESOPHAGOGASTRODUODENOSCOPY (EGD) WITH PROPOFOL;  Surgeon: Wonda Horner, MD;  Location: [facility] ENDOSCOPY;  Service: Endoscopy;  Laterality: N/A;  . RADICAL HYSTERECTOMY    . REPLACEMENT TOTAL KNEE     11/03/07 right....... 03/29/08 left     Social History:  Ambulatory walker      reports that she quit smoking about 46 years ago. She has never used smokeless tobacco. She reports that she does not drink alcohol or use drugs.  Allergies:   Allergies  Allergen Reactions  . Amlodipine Besylate-Valsartan Other (See Comments)    HEADACHE     Family History:   Family History  Problem Relation Age of Onset  . Arrhythmia Mother        afib  . Alzheimer's disease Mother   . Colon cancer Father   . Heart attack Father 31  . Diabetes  Sister   . Arrhythmia Sister        afib    Medications: Prior to Admission medications   Medication Sig Start Date End Date Taking? Authorizing Provider  Acetaminophen 500 MG coapsule Take 500 mg by mouth every 6 (six) hours as needed for pain.  04/08/17  Yes [provider]  AMBULATORY NON FORMULARY MEDICATION Disp 1 walker for use as needed due to frequent falls and back pain 05/31/17  Yes Gregor Hams, MD  amoxicillin (AMOXIL) 500 MG capsule Take 1 capsule (500 mg total) by mouth 3 (three) times daily. 05/31/17 06/07/17 Yes Kandra Nicolas, MD  Calcium Carb-Cholecalciferol (CALCIUM 600/VITAMIN D3) 600-800 MG-UNIT TABS Take 1 tablet by mouth 2 (two) times daily.   Yes [provider]  carvedilol (COREG) 6.25 MG tablet TAKE 1 TABLET TWICE DAILY 05/14/17  Yes Josue Hector, MD  Cholecalciferol (VITAMIN D PO) Take 1 tablet by mouth daily.   Yes [provider]  cyclobenzaprine (FLEXERIL) 5 MG tablet Take 1 tablet (5 mg total) by mouth 3 (three) times daily as needed for muscle spasms. 05/28/17  Yes Phelps, Erin O, PA-C  furosemide (LASIX) 20 MG tablet Take 20 mg by mouth 2 (two) times daily. Except when pt's has prp treatment   Yes [provider]  gabapentin (NEURONTIN) 300 MG capsule TAKE 1 CAPSULE FOUR TIMES DAILY Patient taking differently: TAKE 300 mg CAPSULE Three TIMES DAILY 02/06/17  Yes [name], MD  iron polysaccharides (NU-IRON) 150 MG capsule Take 1 capsule (150 mg total) by mouth daily. 05/23/17  Yes Rai, Ripudeep K, MD  losartan (COZAAR) 100 MG tablet TAKE 1 TABLET EVERY DAY 03/27/17  Yes Josue Hector, MD  metFORMIN (GLUCOPHAGE) 500 MG tablet TAKE 1 TABLET TWICE DAILY WITH MEALS 05/14/17  Yes [name], MD  metolazone (ZAROXOLYN) 2.5 MG tablet TAKE 1 TABLET EVERY OTHER DAY 01/28/17  Yes Josue Hector, MD  Multiple Vitamin (MULTIVITAMIN) capsule Take 1 capsule by mouth daily.     Yes [provider]  Omega-3 Fatty  Acids (FISH OIL PO) Take 1 tablet by mouth daily.   Yes [provider]  oxyCODONE (ROXICODONE) 5 MG immediate release tablet Take 1/2 to 1 pill every 6 hours as needed for  severe pain. 05/29/17  Yes Gregor Hams, MD  pantoprazole (PROTONIX) 40 MG tablet Take 1 tablet (40 mg total) by mouth 2 (two) times daily before a meal. Please take twice a day before meals for 2 WEEKS, then daily thereafter 05/23/17  Yes Rai, Ripudeep K, MD  polyvinyl alcohol-povidone (REFRESH) 1.4-0.6 % ophthalmic solution Place 1-2 drops into both eyes daily as needed (FOR DRY EYES).    Yes [provider]  potassium chloride (K-DUR,KLOR-CON) 10 MEQ tablet Take 1 tablet (10 mEq total) by mouth 2 (two) times daily. 12/03/16  Yes Josue Hector, MD  psyllium (HYDROCIL/METAMUCIL) 95 % PACK Take 1 packet by mouth daily.   Yes [provider]  rivaroxaban (XARELTO) 20 MG TABS tablet TAKE 20 mg TABLET EVERY DAY WITH SUPPER, start on 05/25/17 05/25/17  Yes Rai, Ripudeep K, MD  verapamil (VERELAN PM) 240 MG 24 hr capsule TAKE 1 CAPSULE AT BEDTIME 03/27/17  Yes Josue Hector, MD  vitamin B-12 (CYANOCOBALAMIN) 1000 MCG tablet Take 1,000 mcg by mouth daily.     Yes [provider]    Physical Exam: Patient Vitals for the past 24 hrs:  BP Temp Temp src Pulse Resp SpO2  06/01/17 2026 (!) 117/57 98.7 F (37.1 C) Oral 77 14 94 %  06/01/17 2026 (!) 117/57 97.9 F (36.6 C) Oral 74 18 94 %    1. General:  in No Acute distress * Chronically ill *well *cachectic *toxic acutely ill -appearing 2. Psychological: Alert and   Oriented 3. Head/ENT:    Dry Mucous Membranes                          Head Non traumatic, neck supple                            Poor Dentition 4. SKIN:  decreased Skin turgor,  Skin clean Dry and intact no rash 5. Heart: Regular rate and rhythm no  Murmur, no Rub or gallop 6. Lungs:  no wheezes or crackles   7. Abdomen: Soft, suprapubic tenderness, Non distended  obese  bowel  sounds present, no bruising 8. Lower extremities: no clubbing, cyanosis, or edema 9. Neurologically  strength 5 out of 5 in all 4 extremities cranial nerves II through XII intact 10. MSK: Normal range of motion   body mass index is unknown because there is no height or weight on file.  Labs on Admission:   Labs on Admission: I have personally reviewed following labs and imaging studies  CBC:  Recent Labs Lab 05/29/17 1543 06/01/17 0735  WBC 9.5 7.6  NEUTROABS  --  5.1  HGB 10.5* 9.9*  HCT 34.0* 31.3*  MCV 79.8* 77.5*  PLT 304 867   Basic Metabolic Panel:  Recent Labs Lab 06/01/17 0735  NA 133*  K 2.6*  CL 93*  CO2 32  GLUCOSE 128*  BUN 39*  CREATININE 0.82  CALCIUM 9.2  MG 1.8   GFR: Estimated Creatinine Clearance: 68.5 mL/min (by C-G formula based on SCr of 0.82 mg/dL). Liver Function Tests: No results for input(s): AST, ALT, ALKPHOS, BILITOT, PROT, ALBUMIN in the last 168 hours. No results for input(s): LIPASE, AMYLASE in the last 168 hours. No results for input(s): AMMONIA in the last 168 hours. Coagulation Profile: No results for input(s): INR, PROTIME in the last 168 hours. Cardiac Enzymes: No results for input(s): CKTOTAL, CKMB, CKMBINDEX, TROPONINI  in the last 168 hours. BNP (last 3 results)  Recent Labs  05/21/17 1510  PROBNP 332   HbA1C: No results for input(s): HGBA1C in the last 72 hours. CBG: No results for input(s): GLUCAP in the last 168 hours. Lipid Profile: No results for input(s): CHOL, HDL, LDLCALC, TRIG, CHOLHDL, LDLDIRECT in the last 72 hours. Thyroid Function Tests: No results for input(s): TSH, T4TOTAL, FREET4, T3FREE, THYROIDAB in the last 72 hours. Anemia Panel: No results for input(s): VITAMINB12, FOLATE, FERRITIN, TIBC, IRON, RETICCTPCT in the last 72 hours. Urine analysis:    Component Value Date/Time   COLORURINE YELLOW 06/01/2017 0648   APPEARANCEUR CLEAR 06/01/2017 0648   LABSPEC <1.005 (L) 06/01/2017 0648    PHURINE 6.5 06/01/2017 0648   GLUCOSEU NEGATIVE 06/01/2017 0648   HGBUR NEGATIVE 06/01/2017 0648   BILIRUBINUR NEGATIVE 06/01/2017 0648   BILIRUBINUR small (A) 05/28/2017 1829   KETONESUR NEGATIVE 06/01/2017 0648   PROTEINUR NEGATIVE 06/01/2017 0648   UROBILINOGEN 0.2 05/28/2017 1829   UROBILINOGEN 0.2 03/23/2008 1050   NITRITE NEGATIVE 06/01/2017 0648   LEUKOCYTESUR NEGATIVE 06/01/2017 0648   Sepsis Labs: _0 (procalcitonin:4,lacticidven:4) ) Recent Results (from the past 240 hour(s))  Urine culture     Status: None   Collection Time: 05/28/17  6:33 PM  Result Value Ref Range Status   Culture ENTEROCOCCUS SPECIES  Final   Colony Count Greater than 100,000 CFU/mL  Final   Organism ID, Bacteria ENTEROCOCCUS SPECIES  Final      Susceptibility   Enterococcus species -  (no method available)    AMPICILLIN <=2 Sensitive     LEVOFLOXACIN 0.5 Sensitive     NITROFURANTOIN <=16 Sensitive     VANCOMYCIN 1 Sensitive     TETRACYCLINE >=16 Resistant       UA  no evidence of UTI    Lab Results  Component Value Date   HGBA1C 5.6 02/25/2017    Estimated Creatinine Clearance: 68.5 mL/min (by C-G formula based on SCr of 0.82 mg/dL).  BNP (last 3 results)  Recent Labs  05/21/17 1510  PROBNP 332     ECG REPORT Not obtained today There were no vitals filed for this visit.   Cultures: No results found for: SDES, [place], CULT, REPTSTATUS   Radiological Exams on Admission: Dg Lumbar Spine Complete  Result Date: 06/01/2017 CLINICAL DATA:  Pain after fall. EXAM: LUMBAR SPINE - COMPLETE 4+ VIEW COMPARISON:  May 29, 2017 FINDINGS: Scoliotic curvature of the lumbar spine, apex to the left, is unchanged. Mild wedging of L3 is stable since May 09, 2017, likely chronic. No acute fracture identified. Degenerative changes are seen, most marked at L1-2. Lower lumbar facet degenerative changes are noted. No traumatic malalignment identified. Calcified atherosclerosis is noted.  IMPRESSION: 1. Scoliosis and degenerative change. No acute fracture or traumatic malalignment. Electronically Signed   By: Dorise Bullion III M.D   On: 06/01/2017 07:31   Dg Abd 1 View  Result Date: 06/01/2017 CLINICAL DATA:  Abdominal discomfort.  Constipation for 2 days. EXAM: ABDOMEN - 1 VIEW COMPARISON:  None FINDINGS: Moderate fecal loading in the ascending and transverse colon. No bowel obstruction. IMPRESSION: Moderate fecal loading in the ascending and transverse colon. No other acute abnormalities Electronically Signed   By: Dorise Bullion III M.D   On: 06/01/2017 07:32    Chart has been reviewed    Assessment/Plan   81 y.o. female with medical history significant of paroxysmal Afibon xarelto, CAD, DM,and chronic diastolic CHF, hypertension, GERD, spinal stenosis  Admitted for  Uncontrollable low back pain    Present on Admission: . Low back pain - repeat imaging showing evidence of fracture no neurological changes no fevers or chills. Will order PT OT evaluation pain management patient will likely benefit from placement. For completion order a sedimentation rate and CRP,  Extremities unclear if patient has potential fallen on her abdomen and she is having some abdominal tenderness in that as well obtain CT of abdomen and pelvis showed possible acute on chronic L3 fracture Discussed with radiology, ordered TLSO brace, given difficult to control pain we'll put a consult for interventional radiology to see if patient would benefit from intervention .Chronic Atrial fibrillation (HCC)        - CHA2DS2 vas score 5 : continue current anticoagulation with   on hold for now in case may need IR intervention       -  Rate control:  Currently controlled with Coreg  will continue    . Diabetes mellitus type 2 in obese (HCC) -  - Order Sensitive  SSI   -  check TSH and HgA1C  - Hold by mouth medications   Recent GI bleed - continue PPI currently resolved monitor hemoglobin  .  HYPERTENSION, BENIGN SYSTEMIC - stable continue home medications . Symptomatic anemia - currently stable  Continue to follow Hypo-kalemia: Repeat lab work replace if needed UTI - sensitive to ampicillin will treat, urine appears to be noninfected unclear if partially treated patient continues to be symptomatic Constipation treat with bowel regimen Other plan as per orders.  DVT prophylaxis:  SCD  Code Status:    DNR/DNI  as per patient    Family Communication:   Family  at  Bedside  plan of care was discussed with   Daughter and Richey daughter Disposition Plan:     likely will need placement for rehabilitation                       Would benefit from PT/OT eval prior to DC  ordered                       Social Work    consulted                          Consults called: None    Admission status:   obs   Level of care  tele            I have spent a total of 66 min on this admission   extra time was spent to discuss case with consultants ( radiology)  Toy Baker 06/02/2017, 1:31 AM    [facility]  Pager [phone]   after 2 AM please page floor coverage PA If 7AM-7PM, please contact the day team taking care of the patient  [email or web address]  Password [email or web address]

## 2017-06-01 NOTE — ED Triage Notes (Signed)
Pt reports she fell last PM causing her chronic back pain to worsen and was encouraged by daughter to be seen tonight in the ED

## 2017-06-01 NOTE — ED Notes (Signed)
Bed: WA20 Expected date:  Expected time:  Means of arrival:  Comments: ems 

## 2017-06-01 NOTE — ED Notes (Signed)
I have been contacting pharmacy; and they are continuing to work on making morphine available--thus far I have been blocked from obtaining it.

## 2017-06-01 NOTE — ED Notes (Signed)
Pt tried to get out of bed to ambulate with help but in too much pain to even sit up in bed. RN and PA aware.

## 2017-06-01 NOTE — ED Triage Notes (Signed)
VS BP 148/64 P= 74 Resp= 18 95% RA

## 2017-06-01 NOTE — ED Provider Notes (Signed)
[facility] DEPT Provider Note   CSN: [id number] Arrival date & time: 06/01/17  1955     History   Chief Complaint Chief Complaint  Patient presents with  . Back Pain  . Dc/ReAdmit    HPI Charlotte [name] is a 81 y.o. female who presents to the ED via EMS chest after being discharged from this ED for low back pain. Patient was taken home via EMS, however was unable to stand up off of stretcher to get into her house, therefore she returned to the ED. Patient with recent admission on 05/21/2017 for GI bleed and was discharged with symptoms of low back pain that has been worsening since then. Patient seen in the ED on 05/28/2017 as well as at her PCP on 05/29/2017 for this back pain. She has taken Flexeril and Percocet without relief. On Friday, 05/31/2017 home health reported to her house to set up rehabilitation, as back pain suspected from deconditioning from hospital stay. During her ED visit earlier today, x-ray of L-spine and abdomen negative. Social work was consulted and attempted to set up inpatient rehabilitation, however were unsuccessful.  Pt reports pain is worse on the left side of back, made worse with movement and ambulation. Denies numbness/tingling, weakness, bowel or bladder incontinence, fever/chills. Patient's daughter and granddaughter accompanying patient, reporting she is requiring large amount of assistance at home after hospital admission. They report prior to admission she was ambulatory with a cane and independent.  The history is provided by the patient and a relative.    Past Medical History:  Diagnosis Date  . Aortic insufficiency   . Atrial fibrillation ([place])    a. s/p DCCV 03/2013  . CAD (coronary artery disease)    LHC 06/2010: EF 55%, mild plaque in the LAD 20-30%, otherwise normal coronary arteries  . CATARACTS   . Chronic systolic heart failure ([place])    in setting of AFib;  Echocardiogram 02/2013: Moderate LVH, EF 25-30%, anteroseptal and apical HK,  moderate AI, MAC, moderate MR, moderate LAE, mild RAE, PASP 32 => after DCCV f/u Echo 8/14: Moderate LVH, EF 60-65%, normal wall motion, grade 1 diastolic dysfunction, moderate AI, moderate LAE  . Complication of anesthesia 2009   nausea and vomitting  . Diabetes mellitus without complication ([place])   . GERD (gastroesophageal reflux disease)   . HYPERLIPIDEMIA, MILD   . HYPERTENSION, BENIGN SYSTEMIC   . Irritable bowel syndrome   . LBBB (left bundle branch block)   . MEDIAL EPICONDYLITIS   . NSVT (nonsustained ventricular tachycardia) ([place])    during Dob Echo 2011 => normal cath  . OSTEOARTHRITIS   . OSTEOPENIA   . RHINITIS, ALLERGIC   . SYNCOPE   . VITAMIN D DEFICIENCY     Patient Active Problem List   Diagnosis Date Noted  . Arteriosclerosis of abdominal aorta ([place]) 05/30/2017  . Lumbosacral strain 05/29/2017  . Symptomatic anemia 05/21/2017  . Hyponatremia 05/21/2017  . Gastrointestinal hemorrhage   . Constipation 05/10/2015  . Atrial fibrillation ([place]) 11/10/2014  . Severe obesity (BMI >= 40) ([place]) 06/18/2014  . Aortic regurgitation 03/03/2014  . Diabetes mellitus type 2 in obese ([place]) 12/09/2013  . Long term current use of anticoagulant therapy 05/14/2013  . VITAMIN D DEFICIENCY 10/31/2010  . VENTRICULAR TACHYCARDIA 05/31/2010  . SYNCOPE 04/24/2010  . DYSPNEA 04/24/2010  . CATARACTS 04/20/2010  . EDEMA 02/28/2010  . ELECTROCARDIOGRAM, ABNORMAL 09/19/2007  . Osteoarthritis of both ankles 01/01/2007  . Hyperlipidemia 08/13/2006  . HYPERTENSION, BENIGN SYSTEMIC 07/16/2006  .  RHINITIS, ALLERGIC 07/16/2006  . GASTROESOPHAGEAL REFLUX, NO ESOPHAGITIS 07/16/2006  . IRRITABLE BOWEL SYNDROME 07/16/2006  . OSTEOPENIA 07/16/2006    Past Surgical History:  Procedure Laterality Date  . CARDIOVERSION N/A 04/01/2013   Procedure: CARDIOVERSION;  Surgeon: Josue Hector, MD;  Location: [facility];  Service: Cardiovascular;  Laterality: N/A;  . CATARACT EXTRACTION, BILATERAL   2012   Dr. Herbert Deaner  . COLONOSCOPY WITH PROPOFOL N/A 05/23/2017   Procedure: COLONOSCOPY WITH PROPOFOL;  Surgeon: [facility] Horner, MD;  [facility] END[facility]COPY;  Service: Endoscopy;  Laterality: N/A;  . ESOPHAGOGASTRODUODENOSCOPY (EGD) WITH PROPOFOL N/A 05/23/2017   Procedure: ESOPHAGOGASTRODUODENOSCOPY (EGD) WITH PROPOFOL;  Surgeon: [facility]onda Horner, MD;  Location: [facility] ENDOSCOPY;  Service: Endoscopy;  Laterality: N/A;  . RADICAL HYSTERECTOMY    . REPLACEMENT TOTAL KNEE     11/03/07 right....... 03/29/08 left    OB History    No data available       Home Medications    Prior to Admission medications   Medication Sig Start Date End Date Taking? Authorizing Provider  Acetaminophen 500 MG coapsule Take 500 mg by mouth every 6 (six) hours as needed for pain.  04/08/17  Yes [provider]  AMBULATORY NON FORMULARY MEDICATION Disp 1 walker for use as needed due to frequent falls and back pain 05/31/17  Yes Gregor Hams, MD  amoxicillin (AMOXIL) 500 MG capsule Take 1 capsule (500 mg total) by mouth 3 (three) times daily. 05/31/17 06/07/17 Yes Kandra Nicolas, MD  Calcium Carb-Cholecalciferol (CALCIUM 600/VITAMIN D3) 600-800 MG-UNIT TABS Take 1 tablet by mouth 2 (two) times daily.   Yes [provider]  carvedilol (COREG) 6.25 MG tablet TAKE 1 TABLET TWICE DAILY 05/14/17  Yes Josue Hector, MD  Cholecalciferol (VITAMIN D PO) Take 1 tablet by mouth daily.   Yes [provider]  cyclobenzaprine (FLEXERIL) 5 MG tablet Take 1 tablet (5 mg total) by mouth 3 (three) times daily as needed for muscle spasms. 05/28/17  Yes Phelps, Erin O, PA-C  furosemide (LASIX) 20 MG tablet Take 20 mg by mouth 2 (two) times daily. Except when pt's has prp treatment   Yes [provider]  gabapentin (NEURONTIN) 300 MG capsule TAKE 1 CAPSULE FOUR TIMES DAILY Patient taking differently: TAKE 300 mg CAPSULE Three TIMES DAILY 02/06/17  Yes [name], MD  iron polysaccharides (NU-IRON) 150  MG capsule Take 1 capsule (150 mg total) by mouth daily. 05/23/17  Yes Rai, Ripudeep K, MD  losartan (COZAAR) 100 MG tablet TAKE 1 TABLET EVERY DAY 03/27/17  Yes Josue Hector, MD  metFORMIN (GLUCOPHAGE) 500 MG tablet TAKE 1 TABLET TWICE DAILY WITH MEALS 05/14/17  Yes [name], MD  metolazone (ZAROXOLYN) 2.5 MG tablet TAKE 1 TABLET EVERY OTHER DAY 01/28/17  Yes Josue Hector, MD  Multiple Vitamin (MULTIVITAMIN) capsule Take 1 capsule by mouth daily.     Yes [provider]  Omega-3 Fatty Acids (FISH OIL PO) Take 1 tablet by mouth daily.   Yes [provider]  oxyCODONE (ROXICODONE) 5 MG immediate release tablet Take 1/2 to 1 pill every 6 hours as needed for severe pain. 05/29/17  Yes Gregor Hams, MD  pantoprazole (PROTONIX) 40 MG tablet Take 1 tablet (40 mg total) by mouth 2 (two) times daily before a meal. Please take twice a day before meals for 2 WEEKS, then daily thereafter 05/23/17  Yes Rai, Ripudeep K, MD  polyvinyl alcohol-povidone (REFRESH) 1.4-0.6 % ophthalmic solution  Place 1-2 drops into both eyes daily as needed (FOR DRY EYES).    Yes [provider]  potassium chloride (K-DUR,KLOR-CON) 10 MEQ tablet Take 1 tablet (10 mEq total) by mouth 2 (two) times daily. 12/03/16  Yes Josue Hector, MD  psyllium (HYDROCIL/METAMUCIL) 95 % PACK Take 1 packet by mouth daily.   Yes [provider]  rivaroxaban (XARELTO) 20 MG TABS tablet TAKE 20 mg TABLET EVERY DAY WITH SUPPER, start on 05/25/17 05/25/17  Yes Rai, Ripudeep K, MD  verapamil (VERELAN PM) 240 MG 24 hr capsule TAKE 1 CAPSULE AT BEDTIME 03/27/17  Yes Josue Hector, MD  vitamin B-12 (CYANOCOBALAMIN) 1000 MCG tablet Take 1,000 mcg by mouth daily.     Yes [provider]    Family History Family History  Problem Relation Age of Onset  . Arrhythmia Mother        afib  . Alzheimer's disease Mother   . Colon cancer Father   . Heart attack Father 79  . Diabetes Sister   . Arrhythmia  Sister        afib    Social History Social History  Substance Use Topics  . Smoking status: Former Smoker    Quit date: 10/08/1970  . Smokeless tobacco: Never Used  . Alcohol use No     Allergies   Amlodipine besylate-valsartan   Review of Systems Review of Systems  Constitutional: Negative for chills and fever.  Gastrointestinal: Positive for constipation. Negative for abdominal pain and nausea.       No bowel incontinence  Genitourinary: Negative for difficulty urinating.  Musculoskeletal: Positive for back pain and gait problem.  Skin: Negative for color change.  Neurological: Negative for weakness and numbness.  All other systems reviewed and are negative.    Physical Exam Updated Vital Signs BP (!) 117/57 (BP Location: Left Arm)   Pulse 77   Temp 98.7 F (37.1 C) (Oral)   Resp 14   SpO2 94%   Physical Exam  Constitutional: She appears well-developed and well-nourished.  Morbidly obese female, not in distress.  HENT:  Head: Normocephalic and atraumatic.  Eyes: Conjunctivae are normal.  Neck: Normal range of motion. Neck supple.  Cardiovascular: Normal rate, regular rhythm, normal heart sounds and intact distal pulses.   Pulmonary/Chest: Effort normal and breath sounds normal. No respiratory distress. She has no wheezes. She has no rales.  Abdominal: Soft. Bowel sounds are normal. She exhibits no distension and no mass. There is no tenderness. There is no rebound and no guarding.  Musculoskeletal:  No midline spinal or paraspinal tenderness. Bilateral lateral lower back tenderness and into bilateral gluteal region. No obvious bony step-offs. No focal tenderness. Positive straight leg raise.  Neurological: She is alert.  5/5 strength bilateral upper and lower extremities. Normal sensation distal extremities. Patient unable to ambulate secondary to pain, therefore unable to assess gait.  Skin: Skin is warm.  Psychiatric: She has a normal mood and affect. Her  behavior is normal.  Nursing note and vitals reviewed.    ED Treatments / Results  Labs (all labs ordered are listed, but only abnormal results are displayed) Labs Reviewed - No data to display  EKG  EKG Interpretation None       Radiology Dg Lumbar Spine Complete  Result Date: 06/01/2017 CLINICAL DATA:  Pain after fall. EXAM: LUMBAR SPINE - COMPLETE 4+ VIEW COMPARISON:  May 29, 2017 FINDINGS: Scoliotic curvature of the lumbar spine, apex to the left, is unchanged. Mild  wedging of L3 is stable since May 09, 2017, likely chronic. No acute fracture identified. Degenerative changes are seen, most marked at L1-2. Lower lumbar facet degenerative changes are noted. No traumatic malalignment identified. Calcified atherosclerosis is noted. IMPRESSION: 1. Scoliosis and degenerative change. No acute fracture or traumatic malalignment. Electronically Signed   By: Dorise Bullion III M.D   On: 06/01/2017 07:31   Dg Abd 1 View  Result Date: 06/01/2017 CLINICAL DATA:  Abdominal discomfort.  Constipation for 2 days. EXAM: ABDOMEN - 1 VIEW COMPARISON:  None FINDINGS: Moderate fecal loading in the ascending and transverse colon. No bowel obstruction. IMPRESSION: Moderate fecal loading in the ascending and transverse colon. No other acute abnormalities Electronically Signed   By: Dorise Bullion III M.D   On: 06/01/2017 07:32    Procedures Procedures (including critical care time)  Medications Ordered in ED Medications  morphine 4 MG/ML injection 4 mg (not administered)     Initial Impression / Assessment and Plan / ED Course  I have reviewed the triage vital signs and the nursing notes.  Pertinent labs & imaging results that were available during my care of the patient were reviewed by me and considered in my medical decision making (see chart for details).     Pt returning to the ED immediately following ED discharge via EMS for persistent worsening back pain after recent hospital  admission. Back pain suspected to be due to deconditioning during hospital stay. Patient returning to the ED as she is unable to ambulate secondary to severe pain. No bowel or bladder incontinence, normal neurologic exam. No red flags. X-ray of L-spine and abdomen done earlier today was negative for acute pathology. Given patient's persistent uncontrolled pain with PO medication and inability to ambulate, will admit for pain management. Dr. Roel Cluck consulted and accepting admission.  Patient discussed with and seen by Dr. Leonette Monarch.  The patient appears reasonably stabilized for admission considering the current resources, flow, and capabilities available in the ED at this time, and I doubt any other [facility] requiring further screening and/or treatment in the ED prior to admission.  Final Clinical Impressions(s) / ED Diagnoses   Final diagnoses:  None    New Prescriptions New Prescriptions   No medications on file     Russo, [place] N, PA-C 06/01/17 2256    Fatima Blank, MD 06/02/17 [phone]

## 2017-06-01 NOTE — Progress Notes (Signed)
RN may call report to [place], [phone] at 23:20.

## 2017-06-01 NOTE — ED Notes (Signed)
Pt fell x2 yesterday evening/morning @ 2340 and 0300. Fall exacerbated patient's chronic back pain. Per family, pt unable to get up on her own and family was unable to help her up to the restroom.

## 2017-06-01 NOTE — ED Triage Notes (Signed)
Pt has been brought back by medics who transported her post discharge 2 hours ago. Reportedly on getting home she was unable to move due to the back pain.

## 2017-06-01 NOTE — Discharge Instructions (Signed)
I have placed an order for home health evaluation for possible assistance that can help you at home. Follow-up with your primary care doctor. Return to the ED for new or worsening symptoms.

## 2017-06-01 NOTE — Clinical Social Work Note (Signed)
Clinical Social Work Assessment  Patient Details  Name: Charlotte [name] MRN: [id number] Date of Birth: 06-15-1936  Date of referral:  06/01/17               Reason for consult:  Facility Placement                Permission sought to share information with:  Case Manager Permission granted to share information::  Yes, Verbal Permission Granted  Name::        Agency::     Relationship::     Contact Information:     Housing/Transportation Living arrangements for the past 2 months:  Single Family Home Source of Information:  Patient, Other (Comment Required) (Charlotte [name]) Patient Interpreter Needed:  None Criminal Activity/Legal Involvement Pertinent to Current Situation/Hospitalization:  No - Comment as needed Significant Relationships:  Adult Children, Other Family Members Lives with:  Self Do you feel safe going back to the place where you live?   (Patient's Charlotte expressed concern about patient returning home due to weakness) Need for family participation in patient care:  Yes (Comment)  Care giving concerns:  Patient from home. Patient reported that she fell twice on Friday and had to call EMS to assist her with getting up.  Patient's Charlotte reported that patient is experiencing weakness and unable to assist with transfers and is unable to stand. Patient's Charlotte reported that she and patient's daughter have been assisting patient with ADLs, noting patient has a rolling walker and bedside commode at home. Patient reported that [facility] will be coming to the home starting Monday to start PT/OT.   Social Worker assessment / plan: CSW spoke with patient and patient's Charlotte [name], general practice) at bedside. Patient reported that she fell twice Friday. Patient's Charlotte reported that she was interested in SNF, patient reported that she does not feel that she is able to participate in PT at this time. CSW explained [facility] rehab vs Charlotte [name] term/custodial care  and the different payor sources. Patient's insurance company is closed today and unable to get insurance authorization. Patient reported that she is unable to private pay for SNF. Patient reported that she has a Charlotte [name] term care insurance plan but is unsure of the policy information. Patient reported that her son in law was looking for the policy information but was unable to find.  CSW contacted [facility] Charlotte [name] [phone]) to inquire about additional resources in the home for patient. CSW explained patient's case to [name]. RNCM reported that she is not aware of any other resources that can be provided to the patient in the home. CSW provided patient with private duty care agency list and [place] adult services list, and informed about adult placement service.   PA placed face to face evaluation for possible home health services.  Employment status:  Retired Database administrator PT Recommendations:  Not assessed at this time Information / Referral to community resources:  Other (Comment Required) (Private Duty Care Agency)  Patient/Family's Response to care: Patient/patient's Charlotte thanked CSW for assistance.   Patient/Family's Understanding of and Emotional Response to Diagnosis, Current Treatment, and Prognosis:  Patient remained pleasant throughout assessment. Patient's Charlotte expressed concern about patient returning home, CSW validated patient's granddaughters concerns and discussed barrier to desired placement at SNF. CSW provided private duty care agency list as a resource. CSW provided contact information if patient had any additional questions/concerns.   Emotional Assessment Appearance:  Appears stated age Attitude/Demeanor/Rapport:  Other (Cooperative) Affect (typically  observed):  Pleasant Orientation:  Oriented to Self, Oriented to Situation, Oriented to Place, Oriented to  Time Alcohol / Substance use:  Not Applicable Psych involvement  (Current and /or in the community):  No (Comment)  Discharge Needs  Concerns to be addressed:  Care Coordination Readmission within the last 30 days:  Yes Current discharge risk:  None Barriers to Discharge:  No Barriers Identified   Charlotte Poles, Charlotte [name] 06/01/2017, 4:13 PM

## 2017-06-01 NOTE — Progress Notes (Signed)
CSW received voicemail from patient's daughter inquiring about assistance with placement. CSW returned phone call and received no answer.   Celso Sickle, LCSWA [facility]  Clinical Social Worker [phone]

## 2017-06-01 NOTE — ED Provider Notes (Signed)
[facility] DEPT Provider Note   CSN: [id number] Arrival date & time: 06/01/17  0213     History   Chief Complaint Chief Complaint  Patient presents with  . Back Pain    HPI Charlotte [name] is a 81 y.o. female.  The history is provided by the patient and medical records.  Back Pain      81 year old female with history of A. fib on Xarelto, coronary artery disease, congestive heart failure, diabetes, hyperlipidemia, hypertension, osteoarthritis, osteopenia, presenting to the ED with back pain.  Patient was recently hospitalized for GI bleed and since leaving the hospital she has had worsening low back pain. She was seen at urgent care 2 days ago it had an x-ray performed which was negative. She also had a urinalysis sent and was called yesterday and told that she had a UTI. They called an antibiotic, amoxicillin around 4 PM yesterday. Family has been trying to help patient at home, she had any Benadryl on her bed, but is still been unable to get up independently. States she used to walk with a cane, but since leaving the hospital has been using a walker. When she is up, she does not really have a lot of problems walking.  States she has had some radiation of pain into the left leg but denies this currently. She has not had any numbness or weakness. No bowel or bladder incontinence. States she has had some feelings of constipation, but thinks this is due to the pain medication she was put on. She's not had any fever or chills. Daughter brought patient in because she fell last night and early this morning and has had worsening pain since.  Past Medical History:  Diagnosis Date  . Aortic insufficiency   . Atrial fibrillation ([place])    a. s/p DCCV 03/2013  . CAD (coronary artery disease)    LHC 06/2010: EF 55%, mild plaque in the LAD 20-30%, otherwise normal coronary arteries  . CATARACTS   . Chronic systolic heart failure ([place])    in setting of AFib;  Echocardiogram 02/2013: Moderate LVH, EF  25-30%, anteroseptal and apical HK, moderate AI, MAC, moderate MR, moderate LAE, mild RAE, PASP 32 => after DCCV f/u Echo 8/14: Moderate LVH, EF 60-65%, normal wall motion, grade 1 diastolic dysfunction, moderate AI, moderate LAE  . Complication of anesthesia 2009   nausea and vomitting  . Diabetes mellitus without complication ([place])   . GERD (gastroesophageal reflux disease)   . HYPERLIPIDEMIA, MILD   . HYPERTENSION, BENIGN SYSTEMIC   . Irritable bowel syndrome   . LBBB (left bundle branch block)   . MEDIAL EPICONDYLITIS   . NSVT (nonsustained ventricular tachycardia) ([place])    during Dob Echo 2011 => normal cath  . OSTEOARTHRITIS   . OSTEOPENIA   . RHINITIS, ALLERGIC   . SYNCOPE   . VITAMIN D DEFICIENCY     Patient Active Problem List   Diagnosis Date Noted  . Arteriosclerosis of abdominal aorta ([place]) 05/30/2017  . Lumbosacral strain 05/29/2017  . Symptomatic anemia 05/21/2017  . Hyponatremia 05/21/2017  . Gastrointestinal hemorrhage   . Constipation 05/10/2015  . Atrial fibrillation ([place]) 11/10/2014  . Severe obesity (BMI >= 40) ([place]) 06/18/2014  . Aortic regurgitation 03/03/2014  . Diabetes mellitus type 2 in obese ([place]) 12/09/2013  . Long term current use of anticoagulant therapy 05/14/2013  . VITAMIN D DEFICIENCY 10/31/2010  . VENTRICULAR TACHYCARDIA 05/31/2010  . SYNCOPE 04/24/2010  . DYSPNEA 04/24/2010  . CATARACTS 04/20/2010  .  EDEMA 02/28/2010  . ELECTROCARDIOGRAM, ABNORMAL 09/19/2007  . Osteoarthritis of both ankles 01/01/2007  . Hyperlipidemia 08/13/2006  . HYPERTENSION, BENIGN SYSTEMIC 07/16/2006  . RHINITIS, ALLERGIC 07/16/2006  . GASTROESOPHAGEAL REFLUX, NO ESOPHAGITIS 07/16/2006  . IRRITABLE BOWEL SYNDROME 07/16/2006  . OSTEOPENIA 07/16/2006    Past Surgical History:  Procedure Laterality Date  . CARDIOVERSION N/A 04/01/2013   Procedure: CARDIOVERSION;  Surgeon: Josue Hector, MD;  Location: [facility];  Service: Cardiovascular;  Laterality: N/A;    . CATARACT EXTRACTION, BILATERAL  2012   Dr. Herbert Deaner  . COLONOSCOPY WITH PROPOFOL N/A 05/23/2017   Procedure: COLONOSCOPY WITH PROPOFOL;  Surgeon: Wonda Horner, MD;  Location: [facility] ENDOSCOPY;  Service: Endoscopy;  Laterality: N/A;  . ESOPHAGOGASTRODUODENOSCOPY (EGD) WITH PROPOFOL N/A 05/23/2017   Procedure: ESOPHAGOGASTRODUODENOSCOPY (EGD) WITH PROPOFOL;  Surgeon: Wonda Horner, MD;  Location: [facility] ENDOSCOPY;  Service: Endoscopy;  Laterality: N/A;  . RADICAL HYSTERECTOMY    . REPLACEMENT TOTAL KNEE     11/03/07 right....... 03/29/08 left    OB History    No data available       Home Medications    Prior to Admission medications   Medication Sig Start Date End Date Taking? Authorizing Provider  amoxicillin (AMOXIL) 500 MG capsule Take 1 capsule (500 mg total) by mouth 3 (three) times daily. 05/31/17 06/07/17 Yes Kandra Nicolas, MD  Calcium Carb-Cholecalciferol (CALCIUM 600/VITAMIN D3) 600-800 MG-UNIT TABS Take 1 tablet by mouth 2 (two) times daily.   Yes [provider]  carvedilol (COREG) 6.25 MG tablet TAKE 1 TABLET TWICE DAILY Patient taking differently: TAKE 6.25 mg TABLET TWICE DAILY 05/14/17  Yes Josue Hector, MD  cyclobenzaprine (FLEXERIL) 5 MG tablet Take 1 tablet (5 mg total) by mouth 3 (three) times daily as needed for muscle spasms. 05/28/17  Yes Phelps, Erin O, PA-C  furosemide (LASIX) 20 MG tablet Take 20 mg by mouth 2 (two) times daily. Except when pt's has prp treatment   Yes [provider]  losartan (COZAAR) 100 MG tablet TAKE 1 TABLET EVERY DAY Patient taking differently: TAKE 100 mg TABLET EVERY DAY 03/27/17  Yes Josue Hector, MD  metFORMIN (GLUCOPHAGE) 500 MG tablet TAKE 1 TABLET TWICE DAILY WITH MEALS Patient taking differently: TAKE 500 mg TABLET TWICE DAILY WITH MEALS 05/14/17  Yes [name], MD  metolazone (ZAROXOLYN) 2.5 MG tablet TAKE 1 TABLET EVERY OTHER DAY Patient taking differently: TAKE 2.5 mg TABLET EVERY OTHER DAY 01/28/17  Yes  Josue Hector, MD  Multiple Vitamin (MULTIVITAMIN) capsule Take 1 capsule by mouth daily.     Yes [provider]  oxyCODONE (ROXICODONE) 5 MG immediate release tablet Take 1/2 to 1 pill every 6 hours as needed for severe pain. 05/29/17  Yes Gregor Hams, MD  pantoprazole (PROTONIX) 40 MG tablet Take 1 tablet (40 mg total) by mouth 2 (two) times daily before a meal. Please take twice a day before meals for 2 WEEKS, then daily thereafter 05/23/17  Yes Rai, Ripudeep K, MD  potassium chloride (K-DUR,KLOR-CON) 10 MEQ tablet Take 1 tablet (10 mEq total) by mouth 2 (two) times daily. 12/03/16  Yes Josue Hector, MD  psyllium (HYDROCIL/METAMUCIL) 95 % PACK Take 1 packet by mouth daily.   Yes [provider]  rivaroxaban (XARELTO) 20 MG TABS tablet TAKE 20 mg TABLET EVERY DAY WITH SUPPER, start on 05/25/17 05/25/17  Yes Rai, Ripudeep K, MD  verapamil (VERELAN PM) 240 MG 24 hr capsule TAKE 1 CAPSULE  AT BEDTIME Patient taking differently: TAKE 240 mg CAPSULE AT BEDTIME 03/27/17  Yes Josue Hector, MD  vitamin B-12 (CYANOCOBALAMIN) 1000 MCG tablet Take 1,000 mcg by mouth daily.     Yes [provider]  Acetaminophen 500 MG coapsule Take 500 mg by mouth every 6 (six) hours as needed for pain.  04/08/17   [provider]  AMBULATORY NON FORMULARY MEDICATION Disp 1 walker for use as needed due to frequent falls and back pain 05/31/17   Gregor Hams, MD  gabapentin (NEURONTIN) 300 MG capsule TAKE 1 CAPSULE FOUR TIMES DAILY Patient taking differently: TAKE 300 mg CAPSULE Three TIMES DAILY 02/06/17   [name], MD  iron polysaccharides (NU-IRON) 150 MG capsule Take 1 capsule (150 mg total) by mouth daily. 05/23/17   Rai, Ripudeep Raliegh Ip, MD  polyvinyl alcohol-povidone (REFRESH) 1.4-0.6 % ophthalmic solution Place 1-2 drops into both eyes daily as needed (FOR DRY EYES).     [provider]    Family History Family History  Problem Relation Age of Onset  . Arrhythmia  Mother        afib  . Alzheimer's disease Mother   . Colon cancer Father   . Heart attack Father 94  . Diabetes Sister   . Arrhythmia Sister        afib    Social History Social History  Substance Use Topics  . Smoking status: Former Smoker    Quit date: 10/08/1970  . Smokeless tobacco: Never Used  . Alcohol use No     Allergies   Amlodipine besylate-valsartan   Review of Systems Review of Systems  Musculoskeletal: Positive for back pain.  All other systems reviewed and are negative.    Physical Exam Updated Vital Signs BP (!) 130/56 (BP Location: Right Arm)   Pulse 70   Temp 98.1 F (36.7 C) (Oral)   Resp 18   SpO2 94%   Physical Exam  Constitutional: She is oriented to person, place, and time. She appears well-developed and well-nourished.  Obese, NAD,smiling, able to roll around in the bed and maneuver for exam  HENT:  Head: Normocephalic and atraumatic.  Mouth/Throat: Oropharynx is clear and moist.  Eyes: Pupils are equal, round, and reactive to light. Conjunctivae and EOM are normal.  Neck: Normal range of motion.  Cardiovascular: Normal rate, regular rhythm and normal heart sounds.   Pulmonary/Chest: Effort normal and breath sounds normal. No respiratory distress. She has no wheezes.  Abdominal: Soft. Bowel sounds are normal. There is no tenderness. There is no rebound.  Musculoskeletal: Normal range of motion.  Tenderness of lumbar spine and left lumbar paraspinal muscles; I do not appreciate any gross deformity or step-off; moving legs normally; normal apparent strength/sensation; gait not tested during exam Pelvis stable, hips non-tender  Neurological: She is alert and oriented to person, place, and time.  Skin: Skin is warm and dry.  Psychiatric: She has a normal mood and affect.  Nursing note and vitals reviewed.    ED Treatments / Results  Labs (all labs ordered are listed, but only abnormal results are displayed) Labs Reviewed  CBC WITH  DIFFERENTIAL/PLATELET - Abnormal; Notable for the following:       Result Value   Hemoglobin 9.9 (*)    HCT 31.3 (*)    MCV 77.5 (*)    MCH 24.5 (*)    RDW 18.3 (*)    All other components within normal limits  BASIC METABOLIC PANEL - Abnormal; Notable for  the following:    Sodium 133 (*)    Potassium 2.6 (*)    Chloride 93 (*)    Glucose, Bld 128 (*)    BUN 39 (*)    All other components within normal limits  URINALYSIS, ROUTINE W REFLEX MICROSCOPIC - Abnormal; Notable for the following:    Specific Gravity, Urine <1.005 (*)    All other components within normal limits  URINE CULTURE  MAGNESIUM    EKG  EKG Interpretation None       Radiology Dg Lumbar Spine Complete  Result Date: 06/01/2017 CLINICAL DATA:  Pain after fall. EXAM: LUMBAR SPINE - COMPLETE 4+ VIEW COMPARISON:  May 29, 2017 FINDINGS: Scoliotic curvature of the lumbar spine, apex to the left, is unchanged. Mild wedging of L3 is stable since May 09, 2017, likely chronic. No acute fracture identified. Degenerative changes are seen, most marked at L1-2. Lower lumbar facet degenerative changes are noted. No traumatic malalignment identified. Calcified atherosclerosis is noted. IMPRESSION: 1. Scoliosis and degenerative change. No acute fracture or traumatic malalignment. Electronically Signed   By: Dorise Bullion III M.D   On: 06/01/2017 07:31   Dg Abd 1 View  Result Date: 06/01/2017 CLINICAL DATA:  Abdominal discomfort.  Constipation for 2 days. EXAM: ABDOMEN - 1 VIEW COMPARISON:  None FINDINGS: Moderate fecal loading in the ascending and transverse colon. No bowel obstruction. IMPRESSION: Moderate fecal loading in the ascending and transverse colon. No other acute abnormalities Electronically Signed   By: Dorise Bullion III M.D   On: 06/01/2017 07:32    Procedures Procedures (including critical care time)  Medications Ordered in ED Medications  morphine 4 MG/ML injection 4 mg (4 mg Intramuscular Given  06/01/17 0807)  potassium chloride SA (K-DUR,KLOR-CON) CR tablet 40 mEq (40 mEq Oral Given 06/01/17 0910)  potassium chloride 10 mEq in 100 mL IVPB (0 mEq Intravenous Stopped 06/01/17 1114)  morphine 4 MG/ML injection 4 mg (4 mg Intravenous Given 06/01/17 1357)     Initial Impression / Assessment and Plan / ED Course  I have reviewed the triage vital signs and the nursing notes.  Pertinent labs & imaging results that were available during my care of the patient were reviewed by me and considered in my medical decision making (see chart for details).  81 year old female presenting to the ED for back pain. This is her third evaluation for same in the past few days. Back pain has been worsening since release from the hospital for admission for bleeding peptic ulcer. She did fall last night trying to get out of bed. She has been on oxycodone at home. Patient does have some tenderness of the lumbar spine and left lumbar paraspinal region. There is no appreciable step-off. No hip pain, pelvis is stable, no leg shortening.  Daughter reports diagnosis of UTI, started on amoxicillin yesterday.  We'll plan for plain films of the back, basic labs, UA.  Plain films without acute findings. Does have some stool burden. Potassium is low at 2.6, magnesium is normal. Suspect this is from her diuretics. UA here without signs of infection. Patient's pain treated here with morphine, still unable to get out of bed and ambulate. This seems to have been an issue for her at home recently. Will discuss with social work for possible home health versus rehabilitation placement.  CSW has met with patient-- cannot place from the ED.  No medical necessity requiring admission at this time as hypokalemia has already been addressed here.  Will place face-to-face  evaluation for possible home health services.    Patient seen and evaluated with attending physician, Dr. Vanita Panda, who evaluated patient and agrees with plan of care.    Final Clinical Impressions(s) / ED Diagnoses   Final diagnoses:  Acute left-sided low back pain, with sciatica presence unspecified  Hypokalemia    New Prescriptions New Prescriptions   No medications on file     Larene Pickett, PA-C 06/01/17 1547    Carmin Muskrat, MD 06/01/17 [phone]

## 2017-06-01 NOTE — ED Notes (Signed)
She was unable to get out of bed d/t severe pain in low back whenever this attempt was made.

## 2017-06-02 ENCOUNTER — Encounter (HOSPITAL_COMMUNITY): Payer: Self-pay

## 2017-06-02 ENCOUNTER — Observation Stay (HOSPITAL_COMMUNITY): Payer: Medicare HMO

## 2017-06-02 DIAGNOSIS — D638 Anemia in other chronic diseases classified elsewhere: Secondary | ICD-10-CM | POA: Diagnosis present

## 2017-06-02 DIAGNOSIS — R296 Repeated falls: Secondary | ICD-10-CM | POA: Diagnosis present

## 2017-06-02 DIAGNOSIS — K219 Gastro-esophageal reflux disease without esophagitis: Secondary | ICD-10-CM | POA: Diagnosis present

## 2017-06-02 DIAGNOSIS — I5042 Chronic combined systolic (congestive) and diastolic (congestive) heart failure: Secondary | ICD-10-CM | POA: Diagnosis present

## 2017-06-02 DIAGNOSIS — S32030A Wedge compression fracture of third lumbar vertebra, initial encounter for closed fracture: Secondary | ICD-10-CM

## 2017-06-02 DIAGNOSIS — S39012A Strain of muscle, fascia and tendon of lower back, initial encounter: Secondary | ICD-10-CM | POA: Diagnosis present

## 2017-06-02 DIAGNOSIS — N183 Chronic kidney disease, stage 3 (moderate): Secondary | ICD-10-CM | POA: Diagnosis present

## 2017-06-02 DIAGNOSIS — I482 Chronic atrial fibrillation: Secondary | ICD-10-CM | POA: Diagnosis present

## 2017-06-02 DIAGNOSIS — W19XXXA Unspecified fall, initial encounter: Secondary | ICD-10-CM | POA: Diagnosis not present

## 2017-06-02 DIAGNOSIS — D649 Anemia, unspecified: Secondary | ICD-10-CM | POA: Diagnosis not present

## 2017-06-02 DIAGNOSIS — E876 Hypokalemia: Secondary | ICD-10-CM | POA: Diagnosis present

## 2017-06-02 DIAGNOSIS — E1121 Type 2 diabetes mellitus with diabetic nephropathy: Secondary | ICD-10-CM | POA: Diagnosis present

## 2017-06-02 DIAGNOSIS — Z9841 Cataract extraction status, right eye: Secondary | ICD-10-CM | POA: Diagnosis not present

## 2017-06-02 DIAGNOSIS — M48061 Spinal stenosis, lumbar region without neurogenic claudication: Secondary | ICD-10-CM | POA: Diagnosis present

## 2017-06-02 DIAGNOSIS — M4856XA Collapsed vertebra, not elsewhere classified, lumbar region, initial encounter for fracture: Secondary | ICD-10-CM | POA: Diagnosis present

## 2017-06-02 DIAGNOSIS — S32030S Wedge compression fracture of third lumbar vertebra, sequela: Secondary | ICD-10-CM | POA: Diagnosis not present

## 2017-06-02 DIAGNOSIS — R64 Cachexia: Secondary | ICD-10-CM | POA: Diagnosis present

## 2017-06-02 DIAGNOSIS — B952 Enterococcus as the cause of diseases classified elsewhere: Secondary | ICD-10-CM | POA: Diagnosis present

## 2017-06-02 DIAGNOSIS — E669 Obesity, unspecified: Secondary | ICD-10-CM | POA: Diagnosis not present

## 2017-06-02 DIAGNOSIS — Z87891 Personal history of nicotine dependence: Secondary | ICD-10-CM | POA: Diagnosis not present

## 2017-06-02 DIAGNOSIS — Z66 Do not resuscitate: Secondary | ICD-10-CM | POA: Diagnosis present

## 2017-06-02 DIAGNOSIS — I10 Essential (primary) hypertension: Secondary | ICD-10-CM | POA: Diagnosis not present

## 2017-06-02 DIAGNOSIS — E1122 Type 2 diabetes mellitus with diabetic chronic kidney disease: Secondary | ICD-10-CM | POA: Diagnosis present

## 2017-06-02 DIAGNOSIS — M549 Dorsalgia, unspecified: Secondary | ICD-10-CM | POA: Diagnosis not present

## 2017-06-02 DIAGNOSIS — I480 Paroxysmal atrial fibrillation: Secondary | ICD-10-CM | POA: Diagnosis present

## 2017-06-02 DIAGNOSIS — E1142 Type 2 diabetes mellitus with diabetic polyneuropathy: Secondary | ICD-10-CM | POA: Diagnosis present

## 2017-06-02 DIAGNOSIS — N390 Urinary tract infection, site not specified: Secondary | ICD-10-CM | POA: Diagnosis present

## 2017-06-02 DIAGNOSIS — K922 Gastrointestinal hemorrhage, unspecified: Secondary | ICD-10-CM | POA: Diagnosis not present

## 2017-06-02 DIAGNOSIS — I2510 Atherosclerotic heart disease of native coronary artery without angina pectoris: Secondary | ICD-10-CM | POA: Diagnosis present

## 2017-06-02 DIAGNOSIS — Z6841 Body Mass Index (BMI) 40.0 and over, adult: Secondary | ICD-10-CM | POA: Diagnosis not present

## 2017-06-02 DIAGNOSIS — E1169 Type 2 diabetes mellitus with other specified complication: Secondary | ICD-10-CM | POA: Diagnosis not present

## 2017-06-02 DIAGNOSIS — I130 Hypertensive heart and chronic kidney disease with heart failure and stage 1 through stage 4 chronic kidney disease, or unspecified chronic kidney disease: Secondary | ICD-10-CM | POA: Diagnosis present

## 2017-06-02 LAB — PHOSPHORUS: PHOSPHORUS: 4.2 mg/dL (ref 2.5–4.6)

## 2017-06-02 LAB — COMPREHENSIVE METABOLIC PANEL
ALBUMIN: 3.2 g/dL — AB (ref 3.5–5.0)
ALT: 15 U/L (ref 14–54)
AST: 18 U/L (ref 15–41)
Alkaline Phosphatase: 63 U/L (ref 38–126)
Anion gap: 9 (ref 5–15)
BILIRUBIN TOTAL: 0.7 mg/dL (ref 0.3–1.2)
BUN: 36 mg/dL — AB (ref 6–20)
CHLORIDE: 96 mmol/L — AB (ref 101–111)
CO2: 32 mmol/L (ref 22–32)
CREATININE: 0.9 mg/dL (ref 0.44–1.00)
Calcium: 9.2 mg/dL (ref 8.9–10.3)
GFR calc Af Amer: >60 mL/min (ref >60)
GFR calc non Af Amer: 59 mL/min — ABNORMAL LOW (ref >60)
GLUCOSE: 121 mg/dL — AB (ref 65–99)
POTASSIUM: 3 mmol/L — AB (ref 3.5–5.1)
Sodium: 137 mmol/L (ref 135–145)
TOTAL PROTEIN: 6.8 g/dL (ref 6.5–8.1)

## 2017-06-02 LAB — CBC
HEMATOCRIT: 30.8 % — AB (ref 36.0–46.0)
Hemoglobin: 9.8 g/dL — ABNORMAL LOW (ref 12.0–15.0)
MCH: 24.6 pg — ABNORMAL LOW (ref 26.0–34.0)
MCHC: 31.8 g/dL (ref 30.0–36.0)
MCV: 77.2 fL — AB (ref 78.0–100.0)
Platelets: 262 10*3/uL (ref 150–400)
RBC: 3.99 MIL/uL (ref 3.87–5.11)
RDW: 18.5 % — AB (ref 11.5–15.5)
WBC: 8 10*3/uL (ref 4.0–10.5)

## 2017-06-02 LAB — HEPATIC FUNCTION PANEL
ALBUMIN: 3.4 g/dL — AB (ref 3.5–5.0)
ALT: 16 U/L (ref 14–54)
AST: 25 U/L (ref 15–41)
Alkaline Phosphatase: 67 U/L (ref 38–126)
BILIRUBIN TOTAL: 0.8 mg/dL (ref 0.3–1.2)
Bilirubin, Direct: 0.2 mg/dL (ref 0.1–0.5)
Indirect Bilirubin: 0.6 mg/dL (ref 0.3–0.9)
TOTAL PROTEIN: 7.2 g/dL (ref 6.5–8.1)

## 2017-06-02 LAB — SEDIMENTATION RATE: SED RATE: 20 mm/h (ref 0–22)

## 2017-06-02 LAB — BASIC METABOLIC PANEL
ANION GAP: 11 (ref 5–15)
BUN: 30 mg/dL — ABNORMAL HIGH (ref 6–20)
CALCIUM: 9.4 mg/dL (ref 8.9–10.3)
CO2: 29 mmol/L (ref 22–32)
Chloride: 95 mmol/L — ABNORMAL LOW (ref 101–111)
Creatinine, Ser: 0.85 mg/dL (ref 0.44–1.00)
GFR calc Af Amer: >60 mL/min (ref >60)
Glucose, Bld: 129 mg/dL — ABNORMAL HIGH (ref 65–99)
POTASSIUM: 3.2 mmol/L — AB (ref 3.5–5.1)
Sodium: 135 mmol/L (ref 135–145)

## 2017-06-02 LAB — URINE CULTURE: Culture: NO GROWTH

## 2017-06-02 LAB — TSH: TSH: 2.507 u[IU]/mL (ref 0.350–4.500)

## 2017-06-02 LAB — C-REACTIVE PROTEIN: CRP: 6.3 mg/dL — AB (ref <1.0)

## 2017-06-02 LAB — MAGNESIUM: Magnesium: 1.7 mg/dL (ref 1.7–2.4)

## 2017-06-02 MED ORDER — OXYCODONE HCL 5 MG PO TABS: 5.0000 mg | ORAL_TABLET | Freq: Four times a day (QID) | ORAL | Status: DC | PRN

## 2017-06-02 MED ORDER — VERAPAMIL HCL ER 240 MG PO TBCR
240.0000 mg | EXTENDED_RELEASE_TABLET | Freq: Every day | ORAL | Status: DC
  Administered 2017-06-02 – 2017-06-06 (×5): 240 mg via ORAL
  Filled 2017-06-02 (×6): qty 1

## 2017-06-02 MED ORDER — ACETAMINOPHEN 650 MG RE SUPP: 650.0000 mg | Freq: Four times a day (QID) | RECTAL | Status: DC | PRN

## 2017-06-02 MED ORDER — IOPAMIDOL (ISOVUE-300) INJECTION 61%
100.0000 mL | Freq: Once | INTRAVENOUS | Status: AC | PRN
  Administered 2017-06-02: 100 mL via INTRAVENOUS

## 2017-06-02 MED ORDER — BISACODYL 10 MG RE SUPP: 10.0000 mg | Freq: Every day | RECTAL | Status: DC | PRN

## 2017-06-02 MED ORDER — SODIUM CHLORIDE 0.9 % IV SOLN: 250.0000 mL | INTRAVENOUS | Status: DC | PRN

## 2017-06-02 MED ORDER — GABAPENTIN 300 MG PO CAPS
300.0000 mg | ORAL_CAPSULE | Freq: Three times a day (TID) | ORAL | Status: DC
  Administered 2017-06-02 – 2017-06-07 (×15): 300 mg via ORAL
  Filled 2017-06-02 (×15): qty 1

## 2017-06-02 MED ORDER — FUROSEMIDE 20 MG PO TABS
20.0000 mg | ORAL_TABLET | Freq: Two times a day (BID) | ORAL | Status: DC
Start: 2017-06-02 — End: 2017-06-04
  Administered 2017-06-02 – 2017-06-04 (×5): 20 mg via ORAL
  Filled 2017-06-02 (×5): qty 1

## 2017-06-02 MED ORDER — ONDANSETRON HCL 4 MG/2ML IJ SOLN: 4.0000 mg | Freq: Four times a day (QID) | INTRAMUSCULAR | Status: DC | PRN

## 2017-06-02 MED ORDER — POTASSIUM CHLORIDE CRYS ER 10 MEQ PO TBCR
10.0000 meq | EXTENDED_RELEASE_TABLET | Freq: Two times a day (BID) | ORAL | Status: DC
  Administered 2017-06-02 (×2): 10 meq via ORAL
  Filled 2017-06-02 (×2): qty 1

## 2017-06-02 MED ORDER — SODIUM CHLORIDE 0.9% FLUSH: 3.0000 mL | INTRAVENOUS | Status: DC | PRN

## 2017-06-02 MED ORDER — CARVEDILOL 6.25 MG PO TABS
6.2500 mg | ORAL_TABLET | Freq: Two times a day (BID) | ORAL | Status: DC
  Administered 2017-06-02 – 2017-06-07 (×9): 6.25 mg via ORAL
  Filled 2017-06-02 (×10): qty 1

## 2017-06-02 MED ORDER — SENNA 8.6 MG PO TABS
1.0000 | ORAL_TABLET | Freq: Two times a day (BID) | ORAL | Status: DC
  Administered 2017-06-02 – 2017-06-07 (×11): 8.6 mg via ORAL
  Filled 2017-06-02 (×11): qty 1

## 2017-06-02 MED ORDER — METHOCARBAMOL 500 MG PO TABS
750.0000 mg | ORAL_TABLET | Freq: Three times a day (TID) | ORAL | Status: DC | PRN
  Administered 2017-06-02 (×2): 750 mg via ORAL
  Filled 2017-06-02 (×2): qty 2

## 2017-06-02 MED ORDER — PANTOPRAZOLE SODIUM 40 MG PO TBEC
40.0000 mg | DELAYED_RELEASE_TABLET | Freq: Two times a day (BID) | ORAL | Status: DC
  Administered 2017-06-02 – 2017-06-07 (×10): 40 mg via ORAL
  Filled 2017-06-02 (×10): qty 1

## 2017-06-02 MED ORDER — SODIUM CHLORIDE 0.9% FLUSH
3.0000 mL | Freq: Two times a day (BID) | INTRAVENOUS | Status: DC
  Administered 2017-06-02 – 2017-06-03 (×5): 3 mL via INTRAVENOUS

## 2017-06-02 MED ORDER — SODIUM CHLORIDE 0.9 % IV SOLN
3.0000 g | Freq: Four times a day (QID) | INTRAVENOUS | Status: DC
  Administered 2017-06-02 – 2017-06-07 (×21): 3 g via INTRAVENOUS
  Filled 2017-06-02 (×24): qty 3

## 2017-06-02 MED ORDER — POLYETHYLENE GLYCOL 3350 17 G PO PACK: 17.0000 g | PACK | Freq: Every day | ORAL | Status: DC | PRN

## 2017-06-02 MED ORDER — HYDROCODONE-ACETAMINOPHEN 5-325 MG PO TABS
1.0000 | ORAL_TABLET | Freq: Four times a day (QID) | ORAL | Status: DC | PRN
  Administered 2017-06-02 – 2017-06-06 (×11): 1 via ORAL
  Filled 2017-06-02 (×12): qty 1

## 2017-06-02 MED ORDER — ACETAMINOPHEN 325 MG PO TABS: 650.0000 mg | ORAL_TABLET | Freq: Four times a day (QID) | ORAL | Status: DC | PRN

## 2017-06-02 MED ORDER — ONDANSETRON HCL 4 MG PO TABS: 4.0000 mg | ORAL_TABLET | Freq: Four times a day (QID) | ORAL | Status: DC | PRN

## 2017-06-02 MED ORDER — LOSARTAN POTASSIUM 50 MG PO TABS
100.0000 mg | ORAL_TABLET | Freq: Every day | ORAL | Status: DC
  Administered 2017-06-02: 100 mg via ORAL
  Filled 2017-06-02: qty 2

## 2017-06-02 MED ORDER — HEPARIN (PORCINE) IN NACL 100-0.45 UNIT/ML-% IJ SOLN
1300.0000 [IU]/h | INTRAMUSCULAR | Status: DC
  Administered 2017-06-02: 1300 [IU]/h via INTRAVENOUS
  Filled 2017-06-02: qty 250

## 2017-06-02 MED ORDER — IOPAMIDOL (ISOVUE-300) INJECTION 61%
INTRAVENOUS | Status: AC
  Filled 2017-06-02: qty 100

## 2017-06-02 MED ORDER — RIVAROXABAN 20 MG PO TABS: 20.0000 mg | ORAL_TABLET | Freq: Every day | ORAL | Status: DC

## 2017-06-02 MED ORDER — POTASSIUM CHLORIDE CRYS ER 20 MEQ PO TBCR
40.0000 meq | EXTENDED_RELEASE_TABLET | Freq: Once | ORAL | Status: AC
  Administered 2017-06-02: 40 meq via ORAL
  Filled 2017-06-02: qty 2

## 2017-06-02 NOTE — Progress Notes (Signed)
ANTICOAGULATION CONSULT NOTE - Initial Consult  Pharmacy Consult for Heparin Indication: atrial fibrillation  Allergies  Allergen Reactions  . Amlodipine Besylate-Valsartan Other (See Comments)    HEADACHE     Patient Measurements: Height: 5' 2.5" (158.8 cm) Weight: 241 lb 6.5 oz (109.5 kg) IBW/kg (Calculated) : 51.25 Heparin Dosing Weight: 78  Vital Signs: Temp: 98.7 F (37.1 C) (08/26 0528) Temp Source: Oral (08/26 0528) BP: 105/51 (08/26 1008) Pulse Rate: 68 (08/26 0528)  Labs:  Recent Labs  06/01/17 0735 06/01/17 2351 06/02/17 0517  HGB 9.9*  --  9.8*  HCT 31.3*  --  30.8*  PLT 244  --  262  CREATININE 0.82 0.85 0.90    Estimated Creatinine Clearance: 59.7 mL/min (by C-G formula based on SCr of 0.9 mg/dL).   Medical History: Past Medical History:  Diagnosis Date  . Aortic insufficiency   . Atrial fibrillation ([place])    a. s/p DCCV 03/2013  . CAD (coronary artery disease)    LHC 06/2010: EF 55%, mild plaque in the LAD 20-30%, otherwise normal coronary arteries  . CATARACTS   . Chronic systolic heart failure ([place])    in setting of AFib;  Echocardiogram 02/2013: Moderate LVH, EF 25-30%, anteroseptal and apical HK, moderate AI, MAC, moderate MR, moderate LAE, mild RAE, PASP 32 => after DCCV f/u Echo 8/14: Moderate LVH, EF 60-65%, normal wall motion, grade 1 diastolic dysfunction, moderate AI, moderate LAE  . Complication of anesthesia 2009   nausea and vomitting  . Diabetes mellitus without complication ([place])   . GERD (gastroesophageal reflux disease)   . HYPERLIPIDEMIA, MILD   . HYPERTENSION, BENIGN SYSTEMIC   . Irritable bowel syndrome   . LBBB (left bundle branch block)   . MEDIAL EPICONDYLITIS   . NSVT (nonsustained ventricular tachycardia) ([place])    during Dob Echo 2011 => normal cath  . OSTEOARTHRITIS   . OSTEOPENIA   . RHINITIS, ALLERGIC   . SYNCOPE   . VITAMIN D DEFICIENCY     Medications:  Prescriptions Prior to Admission  Medication Sig  Dispense Refill Last Dose  . Acetaminophen 500 MG coapsule Take 500 mg by mouth every 6 (six) hours as needed for pain.    05/31/2017 at Unknown time  . AMBULATORY NON FORMULARY MEDICATION Disp 1 walker for use as needed due to frequent falls and back pain 1 each 0 Past Month at Unknown time  . amoxicillin (AMOXIL) 500 MG capsule Take 1 capsule (500 mg total) by mouth 3 (three) times daily. 21 capsule 0 06/01/2017 at Unknown time  . Calcium Carb-Cholecalciferol (CALCIUM 600/VITAMIN D3) 600-800 MG-UNIT TABS Take 1 tablet by mouth 2 (two) times daily.   Past Week at Unknown time  . carvedilol (COREG) 6.25 MG tablet TAKE 1 TABLET TWICE DAILY 180 tablet 1 06/01/2017 at 5:15pm  . Cholecalciferol (VITAMIN D PO) Take 1 tablet by mouth daily.   Past Week at Unknown time  . cyclobenzaprine (FLEXERIL) 5 MG tablet Take 1 tablet (5 mg total) by mouth 3 (three) times daily as needed for muscle spasms. 15 tablet 0 06/01/2017 at Unknown time  . furosemide (LASIX) 20 MG tablet Take 20 mg by mouth 2 (two) times daily. Except when pt's has prp treatment   06/01/2017 at Unknown time  . gabapentin (NEURONTIN) 300 MG capsule TAKE 1 CAPSULE FOUR TIMES DAILY (Patient taking differently: TAKE 300 mg CAPSULE Three TIMES DAILY) 360 capsule 1 06/01/2017 at Unknown time  . iron polysaccharides (NU-IRON) 150 MG capsule Take  1 capsule (150 mg total) by mouth daily. 30 capsule 3 06/01/2017 at Unknown time  . losartan (COZAAR) 100 MG tablet TAKE 1 TABLET EVERY DAY 90 tablet 2 06/01/2017 at Unknown time  . metFORMIN (GLUCOPHAGE) 500 MG tablet TAKE 1 TABLET TWICE DAILY WITH MEALS 180 tablet 1 05/31/2017 at Unknown time  . metolazone (ZAROXOLYN) 2.5 MG tablet TAKE 1 TABLET EVERY OTHER DAY 45 tablet 3 05/31/2017 at Unknown time  . Multiple Vitamin (MULTIVITAMIN) capsule Take 1 capsule by mouth daily.     Past Week at Unknown time  . Omega-3 Fatty Acids (FISH OIL PO) Take 1 tablet by mouth daily.   Past Week at Unknown time  . oxyCODONE  (ROXICODONE) 5 MG immediate release tablet Take 1/2 to 1 pill every 6 hours as needed for severe pain. 15 tablet 0 06/01/2017 at Unknown time  . pantoprazole (PROTONIX) 40 MG tablet Take 1 tablet (40 mg total) by mouth 2 (two) times daily before a meal. Please take twice a day before meals for 2 WEEKS, then daily thereafter 60 tablet 4 06/01/2017 at Unknown time  . polyvinyl alcohol-povidone (REFRESH) 1.4-0.6 % ophthalmic solution Place 1-2 drops into both eyes daily as needed (FOR DRY EYES).    Past Week at Unknown time  . potassium chloride (K-DUR,KLOR-CON) 10 MEQ tablet Take 1 tablet (10 mEq total) by mouth 2 (two) times daily. 180 tablet 3 05/31/2017 at Unknown time  . psyllium (HYDROCIL/METAMUCIL) 95 % PACK Take 1 packet by mouth daily.   06/01/2017 at Unknown time  . rivaroxaban (XARELTO) 20 MG TABS tablet TAKE 20 mg TABLET EVERY DAY WITH SUPPER, start on 05/25/17 90 tablet 3 06/01/2017 at 5:15pm  . verapamil (VERELAN PM) 240 MG 24 hr capsule TAKE 1 CAPSULE AT BEDTIME 90 capsule 2 05/31/2017 at Unknown time  . vitamin B-12 (CYANOCOBALAMIN) 1000 MCG tablet Take 1,000 mcg by mouth daily.     Past Week at Unknown time   Scheduled:  . carvedilol  6.25 mg Oral BID  . furosemide  20 mg Oral BID  . gabapentin  300 mg Oral TID  . losartan  100 mg Oral Daily  . pantoprazole  40 mg Oral BID AC  . potassium chloride  10 mEq Oral BID  . senna  1 tablet Oral BID  . sodium chloride flush  3 mL Intravenous Q12H  . verapamil  240 mg Oral QHS   Infusions:  . sodium chloride    . ampicillin-sulbactam (UNASYN) IV 3 g (06/02/17 1001)    Assessment: 81yo F admitted with back pain. On chronic Xarelto for paroxysmal A.fib, last dose taken 8/25 at 17:15. Pharmacy is asked to start heparin drip while holding oral anticoag.    Goal of Therapy:  Heparin level 0.3-0.7 units/ml Monitor platelets by anticoagulation protocol: Yes   Plan:  Start heparin 24 hours after last Xarelto dose with no bolus at 1300  units/hr. Check heparin level in 6hrs and daily thereafter. Check CBC q24h while on heparin. F/u daily.  Romeo Rabon, PharmD, pager [phone]. 06/02/2017,12:21 PM.

## 2017-06-02 NOTE — Progress Notes (Signed)
PROGRESS NOTE    Charlotte [name]  ZOX:[id number] DOB: 03/29/1936 DOA: 06/01/2017 PCP: Agapito Games, MD   Brief Narrative: Charlotte [name] is a 81 y.o. female with a history of atrial fibrillation on Xarelto, CAD, diabetes mellitus, chronic diastolic heart failure, essential hypertension, GERD. Patient presented with back pain and falls and was found to have an L3 vertebral compression fracture. Patient has been managed on analgesics. PT evaluation and interventional radiology evaluation pending.   Assessment & Plan:   Active Problems:   HYPERTENSION, BENIGN SYSTEMIC   Long term current use of anticoagulant therapy   Diabetes mellitus type 2 in obese [facility])   Atrial fibrillation (HCC)   Symptomatic anemia   Low back pain   Intractable low back pain   Falls   Intractable back pain   Compression fracture of L3 lumbar vertebra (HCC)   Low back pain Secondary to compression fracture of L3. -Norco -TLSO brace -IR consult pending -PT eval pending -Continue Robaxin when necessary  Chronic atrial fibrillation CHA2DS2-VASc Score is 5. On Xarelto as an outpatient. -Start heparin drip while off of Xarelto  Enterococcus UTI -Continue Unasyn   Peripheral neuropathy -Continue gabapentin  Frequent falls Secondary to pain in addition to neuropathy. Blood pressure could be contributing. -PT eval pending  Diabetes mellitus type 2 with peripheral neuropathy -Continue sliding-scale insulin  Essential hypertension -Continue carvedilol -discontinue losartan secondary to soft blood pressures  Anemia Stable. Microcytic. With chronic GI bleeding.  Hypokalemia -Potassium supplementation -Repeat BMP in the morning  History of GI bleed -Continue PPI   DVT prophylaxis: Heparin drip Code Status: DO NOT RESUSCITATE Family Communication: Daughter at bedside Disposition Plan: Discharge to home versus SNF pending PT evaluation   Consultants:   Interventional  radiology  Procedures:   Heparin drip (8/26>>  Antimicrobials:  Unasyn (8/25>>    Subjective: Patient reports improvement in back pain overnight. Getting worse this morning.  Objective: Vitals:   06/02/17 0020 06/02/17 0050 06/02/17 0528 06/02/17 1008  BP: (!) 117/59 (!) 107/47 (!) 97/47 (!) 105/51  Pulse: 79 76 68   Resp: 14 16 16    Temp:  98.3 F (36.8 C) 98.7 F (37.1 C)   TempSrc:  Oral Oral   SpO2: 94%  95%   Weight:  109.5 kg (241 lb 6.5 oz)    Height:  5' 2.5" (1.588 m)      Intake/Output Summary (Last 24 hours) at 06/02/17 1209 Last data filed at 06/02/17 0600  Gross per 24 hour  Intake              220 ml  Output                0 ml  Net              220 ml   Filed Weights   06/02/17 0050  Weight: 109.5 kg (241 lb 6.5 oz)    Examination:  General exam: Appears calm and comfortable Respiratory system: Clear to auscultation. Respiratory effort normal. Cardiovascular system: S1 & S2 heard, RRR. No murmurs. Gastrointestinal system: Abdomen is nondistended, soft and nontender. Normal bowel sounds heard. Central nervous system: Alert and oriented. No focal neurological deficits. Extremities: No edema. No calf tenderness Skin: No cyanosis. Psychiatry: Judgement and insight appear normal. Mood & affect appropriate.     Data Reviewed: I have personally reviewed following labs and imaging studies  CBC:  Recent Labs Lab 05/29/17 1543 06/01/17 0735 06/02/17 0517  WBC 9.5 7.6 8.0  NEUTROABS  --  5.1  --   HGB 10.5* 9.9* 9.8*  HCT 34.0* 31.3* 30.8*  MCV 79.8* 77.5* 77.2*  PLT 304 244 262   Basic Metabolic Panel:  Recent Labs Lab 06/01/17 0735 06/01/17 2351 06/02/17 0517  NA 133* 135 137  K 2.6* 3.2* 3.0*  CL 93* 95* 96*  CO2 32 29 32  GLUCOSE 128* 129* 121*  BUN 39* 30* 36*  CREATININE 0.82 0.85 0.90  CALCIUM 9.2 9.4 9.2  MG 1.8  --  1.7  PHOS  --   --  4.2   GFR: Estimated Creatinine Clearance: 59.7 mL/min (by C-G formula based on  SCr of 0.9 mg/dL). Liver Function Tests:  Recent Labs Lab 06/01/17 2351 06/02/17 0517  AST 25 18  ALT 16 15  ALKPHOS 67 63  BILITOT 0.8 0.7  PROT 7.2 6.8  ALBUMIN 3.4* 3.2*   No results for input(s): LIPASE, AMYLASE in the last 168 hours. No results for input(s): AMMONIA in the last 168 hours. Coagulation Profile: No results for input(s): INR, PROTIME in the last 168 hours. Cardiac Enzymes: No results for input(s): CKTOTAL, CKMB, CKMBINDEX, TROPONINI in the last 168 hours. BNP (last 3 results)  Recent Labs  05/21/17 1510  PROBNP 332   HbA1C: No results for input(s): HGBA1C in the last 72 hours. CBG: No results for input(s): GLUCAP in the last 168 hours. Lipid Profile: No results for input(s): CHOL, HDL, LDLCALC, TRIG, CHOLHDL, LDLDIRECT in the last 72 hours. Thyroid Function Tests:  Recent Labs  06/02/17 0517  TSH 2.507   Anemia Panel: No results for input(s): VITAMINB12, FOLATE, FERRITIN, TIBC, IRON, RETICCTPCT in the last 72 hours. Sepsis Labs: No results for input(s): PROCALCITON, LATICACIDVEN in the last 168 hours.  Recent Results (from the past 240 hour(s))  Urine culture     Status: None   Collection Time: 05/28/17  6:33 PM  Result Value Ref Range Status   Culture ENTEROCOCCUS SPECIES  Final   Colony Count Greater than 100,000 CFU/mL  Final   Organism ID, Bacteria ENTEROCOCCUS SPECIES  Final      Susceptibility   Enterococcus species -  (no method available)    AMPICILLIN <=2 Sensitive     LEVOFLOXACIN 0.5 Sensitive     NITROFURANTOIN <=16 Sensitive     VANCOMYCIN 1 Sensitive     TETRACYCLINE >=16 Resistant   Urine culture     Status: None   Collection Time: 06/01/17  6:48 AM  Result Value Ref Range Status   Specimen Description URINE, RANDOM  Final   Special Requests NONE  Final   Culture   Final    NO GROWTH Performed at [facility], [address] [place], [place] [address]    Report Status 06/02/2017 FINAL  Final          Radiology Studies: Dg Lumbar Spine Complete  Result Date: 06/01/2017 CLINICAL DATA:  Pain after fall. EXAM: LUMBAR SPINE - COMPLETE 4+ VIEW COMPARISON:  May 29, 2017 FINDINGS: Scoliotic curvature of the lumbar spine, apex to the left, is unchanged. Mild wedging of L3 is stable since May 09, 2017, likely chronic. No acute fracture identified. Degenerative changes are seen, most marked at L1-2. Lower lumbar facet degenerative changes are noted. No traumatic malalignment identified. Calcified atherosclerosis is noted. IMPRESSION: 1. Scoliosis and degenerative change. No acute fracture or traumatic malalignment. Electronically Signed   By: Gerome Sam III M.D   On: 06/01/2017 07:31   Dg Abd 1  View  Result Date: 06/01/2017 CLINICAL DATA:  Abdominal discomfort.  Constipation for 2 days. EXAM: ABDOMEN - 1 VIEW COMPARISON:  None FINDINGS: Moderate fecal loading in the ascending and transverse colon. No bowel obstruction. IMPRESSION: Moderate fecal loading in the ascending and transverse colon. No other acute abnormalities Electronically Signed   By: Gerome Sam III M.D   On: 06/01/2017 07:32   Ct Abdomen Pelvis W Contrast  Result Date: 06/02/2017 CLINICAL DATA:  Progressive low back pain, recent admission 05/21/2017 for gastrointestinal bleeding. EXAM: CT ABDOMEN AND PELVIS WITH CONTRAST TECHNIQUE: Multidetector CT imaging of the abdomen and pelvis was performed using the standard protocol following bolus administration of intravenous contrast. CONTRAST:  ISOVUE-300 IOPAMIDOL (ISOVUE-300) INJECTION 61% COMPARISON:  Abdomen and lumbar spine radiographs dating back through 05/19/2007 FINDINGS: Lower chest: Cardiomegaly. No pericardial effusion. Minimal bibasilar atelectasis. No pneumonic consolidation or pneumothorax. No pleural effusion. Hepatobiliary: The at least 3 subcentimeter hypodensities are noted within the liver, two in the right hepatic lobe and one on the left  statistically consistent with cysts or hemangiomata. Pancreas: Unremarkable. No pancreatic ductal dilatation or surrounding inflammatory changes. Spleen: No splenomegaly. Nonspecific 8 mm hypodensity is noted possibly representing small hemangioma or hematopoietic rest. Adrenals/Urinary Tract: Adrenal glands are unremarkable. Kidneys are normal, without renal calculi, focal lesion, or hydronephrosis. Bladder is unremarkable. Stomach/Bowel: Stomach is within normal limits. Appendix appears normal. No evidence of bowel wall thickening, distention, or inflammatory changes. Vascular/Lymphatic: Aortic atherosclerosis. No enlarged abdominal or pelvic lymph nodes. Reproductive: Status post hysterectomy. No adnexal masses. Other: None Musculoskeletal: Recent appearing superior endplate compression of L3 slightly more accentuated than on prior studies dating back 2008. No retropulsion is seen. The fractures are seen along the anterior right lateral aspect of the vertebral body. Chronic stable degenerative disc disease L1-2 and L3-4. Lower lumbar facet arthropathy is identified with facet joint space narrowing and hypertrophy at L4-5 and L5-S1. IMPRESSION: 1. Acute on chronic appearing mild superior endplate compression of L3 with fracture lucencies noted anteriorly and right lateral. 2. Degenerative disc disease L1-2 and L3-4 with L4-5 and L5-S1 facet arthropathy. 3. Hepatic and splenic hypodensities too small to further characterize but statistically consistent with cysts or hemangiomata. 4. Mild cardiomegaly. Electronically Signed   By: Tollie Eth M.D.   On: 06/02/2017 01:10        Scheduled Meds: . carvedilol  6.25 mg Oral BID  . furosemide  20 mg Oral BID  . gabapentin  300 mg Oral TID  . iopamidol      . losartan  100 mg Oral Daily  . pantoprazole  40 mg Oral BID AC  . potassium chloride  10 mEq Oral BID  . senna  1 tablet Oral BID  . sodium chloride flush  3 mL Intravenous Q12H  . verapamil  240 mg  Oral QHS   Continuous Infusions: . sodium chloride    . ampicillin-sulbactam (UNASYN) IV 3 g (06/02/17 1001)     LOS: 0 days     Jacquelin Hawking, MD [facility] 06/02/2017, 12:09 PM Pager: [phone]  If 7PM-7AM, please contact night-coverage [email or web address] Password [facility] 06/02/2017, 12:09 PM

## 2017-06-02 NOTE — Progress Notes (Signed)
PT Cancellation Note  Patient Details Name: Charlotte [name] MRN: [id number] DOB: [birth year]   Cancelled Treatment:    Reason Eval/Treat Not Completed: Other (comment) (awaiting delivery of TLSO, will check back later today. )   [name] 06/02/2017, 11:27 AM  [phone]

## 2017-06-02 NOTE — Evaluation (Signed)
Physical Therapy Evaluation Patient Details Name: Charlotte [name] MRN: [id number] DOB: May 11, 1936 Today's Date: 06/02/2017   History of Present Illness  81 y.o. female with medical history significant of paroxysmal Afibon xarelto, CAD, DM,and chronic diastolic CHF, hypertension, GERD, spinal stenosis admitted with B hip/knee pain x 3 weeks, 2 falls just PTA, L3 compression fx. (more details: Charlotte [name] is a 81 y.o. female presenting to ED after just being released from ED and unable to even move from stretcher to her bed at home, so she was brought back. She recently was seen at Urgent Care for worsening back pain over last 3 weeks, then fell on 8/23 and 8/24 with even greater severe pain in her mid back. She had a recent  admisson to the hospital from 05/21/17 to 05/23/17 for a GI bleed. Hx of chronic back pain and bilateral knee replacement about 10 years ago.  She lives alone and typically walks with a cane at home but since last few weeks she has been using her walker. )  Clinical Impression  Pt very limited with ability today due to sharp pain in Low back region around L3 area with very little movement. She still wanted to try and attempt to sit EOB and stand, however it was very difficulty and unable to fully stand and weight bear today. Will benefit from continued PT to work on increasing mobility. Aware IR order an MRI in order to help decide on VP/KP options for patient. Will continue to follow.     Follow Up Recommendations SNF (depending on pt's progress and family ability to assist at home)    Equipment Recommendations  Rolling walker with 5" wheels    Recommendations for Other Services       Precautions / Restrictions Precautions Precautions: Fall Required Braces or Orthoses: Spinal Brace Spinal Brace: Thoracolumbosacral orthotic (use when ambulating )      Mobility  Bed Mobility Overal bed mobility: Needs Assistance Bed Mobility: Sidelying to Sit;Sit to Sidelying    Sidelying to sit: Max assist     Sit to sidelying: Max assist General bed mobility comments: max A to help scoot due to sharp pain with very little movement. went slow and in sections to decrease movment and pain as much as possible. Educated on hooklying for log roll , etc both directionswith bed mobilitya dn sidelying to /from sit.   Transfers Overall transfer level: Needs assistance Equipment used: Rolling walker (2 wheeled) Transfers: Sit to/from Stand Sit to Stand: +2 physical assistance;+2 safety/equipment;Mod assist;From elevated surface         General transfer comment: tried to weight bear 2times however only able to do small lift off due to sharp pain in L3 area with attempt to weight bear.   Ambulation/Gait Ambulation/Gait assistance:  (unable to )              Careers information officer    Modified Rankin (Stroke Patients Only)       Balance                                             Pertinent Vitals/Pain Pain Assessment: 0-10 Pain Score: 9  Pain Location: mid back right at L3 level. painful upon palpation and with any movement (across back area as well)  Pain Descriptors / Indicators:  Sharp;Stabbing Pain Intervention(s): Limited activity within patient's tolerance;Monitored during session;Repositioned (subsided once she was still and not moving. )    Home Living Family/patient expects to be discharged to:: Private residence Living Arrangements: Alone Available Help at Discharge: Family (dtr is a Engineer, civil (consulting) at [employer] , but can be off initally to help) Type of Home: House Home Access: Stairs to enter;Ramped entrance   Entrance Stairs-Number of Steps: 1 Home Layout: One level Home Equipment: Walker - 4 wheels;Walker - 2 wheels;Cane - single point;Toilet riser (* RW is bent due to her fall, daughter was going to have to go to [place] to get new order to take to [facility]. )      Prior Function Level of  Independence: Independent         Comments: prior to 3 weeks ago, independent and dtr just helped with some fo the grocery shopping and some meals      Hand Dominance        Extremity/Trunk Assessment        Lower Extremity Assessment Lower Extremity Assessment: Generalized weakness (difficult to assess truely due to limited ability to move LEs or stand with back pain so severe. Pt tried and was very willing. )       Communication   Communication: No difficulties  Cognition Arousal/Alertness: Awake/alert Behavior During Therapy: WFL for tasks assessed/performed Overall Cognitive Status: Within Functional Limits for tasks assessed                                        General Comments      Exercises     Assessment/Plan    PT Assessment Patient needs continued PT services  PT Problem List Decreased strength;Decreased activity tolerance;Decreased mobility       PT Treatment Interventions DME instruction;Gait training;Functional mobility training;Therapeutic activities;Therapeutic exercise;Patient/family education    PT Goals (Current goals can be found in the Care Plan section)  Acute Rehab PT Goals Patient Stated Goal: I want to get this better  PT Goal Formulation: With patient/family Time For Goal Achievement: 06/16/17 Potential to Achieve Goals: Good    Frequency Min 3X/week   Barriers to discharge        Co-evaluation               AM-PAC PT "6 Clicks" Daily Activity  Outcome Measure Difficulty turning over in bed (including adjusting bedclothes, sheets and blankets)?: Unable Difficulty moving from lying on back to sitting on the side of the bed? : Unable Difficulty sitting down on and standing up from a chair with arms (e.g., wheelchair, bedside commode, etc,.)?: Unable Help needed moving to and from a bed to chair (including a wheelchair)?: A Lot Help needed walking in hospital room?: Total Help needed climbing 3-5 steps  with a railing? : Total 6 Click Score: 7    End of Session   Activity Tolerance: Patient limited by pain (also limited by fluch feeling. BP was low early today) Patient left: in bed;with call bell/phone within reach;with family/visitor present Nurse Communication: Mobility status PT Visit Diagnosis: Other abnormalities of gait and mobility (R26.89);Repeated falls (R29.6)    Time: 1600-1700 PT Time Calculation (min) (ACUTE ONLY): 60 min   Charges:   PT Evaluation $PT Eval Moderate Complexity: 1 Mod PT Treatments $Therapeutic Activity: 23-37 mins $Self Care/Home Management: 8-22   PT G Codes:   PT G-Codes **NOT FOR  INPATIENT CLASS** Functional Assessment Tool Used: AM-PAC 6 Clicks Basic Mobility    Marella Bile, [place] Pager: [phone] 06/02/2017   Marella Bile 06/02/2017, 11:19 PM

## 2017-06-02 NOTE — Progress Notes (Signed)
PT Note  Patient Details Name: HORIZON NIJJAR MRN: [id number] DOB: Sep 03, 1936    Assessment performed today, limited due to such great acute pain in lumbar region with very little movement. Performed a lot of education, sat EOB, and attempted standing however with every little movement patient would report a sharp burst of pain in lumbar region. Attempted to stand at [facility] with 2 person assist and elevated bed , however pt unable to come upright due to the sharp pain. Was able to get pain lying back down and pain back down to a minimal in supine. Daughter was present during entire session.   At this time unable to go home alone, however will follow as patient awaiting MRI for IR to decide on VP/KP on L3.   Pt stated she has always had a little back pain , however has been able to be independent driving, lives alone, until sharp pain with first fall this past Thursday (05/30/2017), and a second fall on Friday and then was brought to the ER, and taken home and was not able to get off stretcher at home so she was brought back.   Full write up to follow.Marland Kitchen   Marella Bile 06/02/2017, 5:56 PM

## 2017-06-02 NOTE — Progress Notes (Signed)
Patient ID: Charlotte [name], female   DOB: 1936-03-25, 81 y.o.   MRN: [id number] Aware of request for poss VP/KP on pt with L3 fracture, ? acuity; pt does have point tenderness in region; latest CT reviewed by Dr. Lowella Dandy; rec lumbar MRI to determine acuity of fracture before deciding whether to proceed with case. Above d/w pt.

## 2017-06-02 NOTE — Progress Notes (Signed)
Pharmacy Antibiotic Note  Charlotte [name] is a 81 y.o. female c/o back pain admitted on 06/01/2017 with UTI.  Pharmacy has been consulted for unasyn dosing.  Plan: Unasyn 3 Gm IV q6h F/u scr/cultures as indicated  Height: 5' 2.5" (158.8 cm) Weight: 241 lb 6.5 oz (109.5 kg) IBW/kg (Calculated) : 51.25  Temp (24hrs), Avg:98.2 F (36.8 C), Min:97.8 F (36.6 C), Max:98.7 F (37.1 C)   Recent Labs Lab 05/29/17 1543 06/01/17 0735 06/01/17 2351  WBC 9.5 7.6  --   CREATININE  --  0.82 0.85    Estimated Creatinine Clearance: 63.2 mL/min (by C-G formula based on SCr of 0.85 mg/dL).    Allergies  Allergen Reactions  . Amlodipine Besylate-Valsartan Other (See Comments)    HEADACHE     Antimicrobials this admission: 8/26 unasyn >>    >>   Dose adjustments this admission:   Microbiology results:  BCx:   UCx:    Sputum:    MRSA PCR:   Thank you for allowing pharmacy to be a part of this patient's care.  Lorenza Evangelist 06/02/2017 1:00 AM

## 2017-06-03 ENCOUNTER — Telehealth: Payer: Self-pay | Admitting: Family Medicine

## 2017-06-03 ENCOUNTER — Inpatient Hospital Stay (HOSPITAL_COMMUNITY): Payer: Medicare HMO

## 2017-06-03 LAB — BASIC METABOLIC PANEL
Anion gap: 9 (ref 5–15)
BUN: 42 mg/dL — AB (ref 6–20)
CALCIUM: 8.7 mg/dL — AB (ref 8.9–10.3)
CO2: 28 mmol/L (ref 22–32)
Chloride: 97 mmol/L — ABNORMAL LOW (ref 101–111)
Creatinine, Ser: 1.01 mg/dL — ABNORMAL HIGH (ref 0.44–1.00)
GFR calc Af Amer: 60 mL/min — ABNORMAL LOW (ref >60)
GFR, EST NON AFRICAN AMERICAN: 52 mL/min — AB (ref >60)
GLUCOSE: 111 mg/dL — AB (ref 65–99)
Potassium: 3 mmol/L — ABNORMAL LOW (ref 3.5–5.1)
Sodium: 134 mmol/L — ABNORMAL LOW (ref 135–145)

## 2017-06-03 LAB — CBC
HCT: 28.3 % — ABNORMAL LOW (ref 36.0–46.0)
Hemoglobin: 9 g/dL — ABNORMAL LOW (ref 12.0–15.0)
MCH: 25.1 pg — ABNORMAL LOW (ref 26.0–34.0)
MCHC: 31.8 g/dL (ref 30.0–36.0)
MCV: 78.8 fL (ref 78.0–100.0)
PLATELETS: 245 10*3/uL (ref 150–400)
RBC: 3.59 MIL/uL — AB (ref 3.87–5.11)
RDW: 19.1 % — AB (ref 11.5–15.5)
WBC: 7.6 10*3/uL (ref 4.0–10.5)

## 2017-06-03 LAB — APTT
APTT: 144 s — AB (ref 24–36)
APTT: 60 s — AB (ref 24–36)

## 2017-06-03 LAB — HEPARIN LEVEL (UNFRACTIONATED): Heparin Unfractionated: >2.2 IU/mL — ABNORMAL HIGH (ref 0.30–0.70)

## 2017-06-03 MED ORDER — HEPARIN (PORCINE) IN NACL 100-0.45 UNIT/ML-% IJ SOLN
1000.0000 [IU]/h | INTRAMUSCULAR | Status: DC
  Administered 2017-06-03: 1000 [IU]/h via INTRAVENOUS

## 2017-06-03 MED ORDER — POLYETHYLENE GLYCOL 3350 17 G PO PACK: 17.0000 g | PACK | Freq: Once | ORAL | Status: DC

## 2017-06-03 MED ORDER — PSYLLIUM 95 % PO PACK
1.0000 | PACK | Freq: Every day | ORAL | Status: DC
  Administered 2017-06-03 – 2017-06-06 (×4): 1 via ORAL
  Filled 2017-06-03 (×5): qty 1

## 2017-06-03 MED ORDER — HEPARIN (PORCINE) IN NACL 100-0.45 UNIT/ML-% IJ SOLN
1250.0000 [IU]/h | INTRAMUSCULAR | Status: AC
  Administered 2017-06-03 – 2017-06-05 (×3): 1100 [IU]/h via INTRAVENOUS
  Filled 2017-06-03 (×3): qty 250

## 2017-06-03 NOTE — [place] FL2 (Signed)
[place] MEDICAID FL2 LEVEL OF CARE SCREENING TOOL     IDENTIFICATION  Patient Name: Charlotte [name] Birthdate: 07-Feb-1936 Sex: female Admission Date (Current Location): 06/01/2017  [facility] and [place] Number:  Producer, television/film/video and Address:  [facility],  [address], [place] [address]      Provider Number: [id number]  Attending Physician Name and Address:  Pearson Grippe, MD  Relative Name and Phone Number:       Current Level of Care: Hospital Recommended Level of Care: Skilled Nursing Facility Prior Approval Number:    Date Approved/Denied:   PASRR Number: [id number]  Discharge Plan: SNF    Current Diagnoses: Patient Active Problem List   Diagnosis Date Noted  . Compression fracture of L3 lumbar vertebra (HCC) 06/02/2017  . Low back pain 06/01/2017  . Intractable low back pain 06/01/2017  . Falls 06/01/2017  . Intractable back pain 06/01/2017  . Arteriosclerosis of abdominal aorta (HCC) 05/30/2017  . Lumbosacral strain 05/29/2017  . Symptomatic anemia 05/21/2017  . Hyponatremia 05/21/2017  . Gastrointestinal hemorrhage   . Constipation 05/10/2015  . Atrial fibrillation (HCC) 11/10/2014  . Severe obesity (BMI >= 40) (HCC) 06/18/2014  . Aortic regurgitation 03/03/2014  . Diabetes mellitus type 2 in obese (HCC) 12/09/2013  . Kweli Grassel term current use of anticoagulant therapy 05/14/2013  . VITAMIN D DEFICIENCY 10/31/2010  . VENTRICULAR TACHYCARDIA 05/31/2010  . SYNCOPE 04/24/2010  . DYSPNEA 04/24/2010  . CATARACTS 04/20/2010  . EDEMA 02/28/2010  . ELECTROCARDIOGRAM, ABNORMAL 09/19/2007  . Osteoarthritis of both ankles 01/01/2007  . Hyperlipidemia 08/13/2006  . HYPERTENSION, BENIGN SYSTEMIC 07/16/2006  . RHINITIS, ALLERGIC 07/16/2006  . GASTROESOPHAGEAL REFLUX, NO ESOPHAGITIS 07/16/2006  . IRRITABLE BOWEL SYNDROME 07/16/2006  . OSTEOPENIA 07/16/2006    Orientation RESPIRATION BLADDER Height & Weight     Self, Time, Situation, Place  Normal  External catheter Weight: 241 lb 6.5 oz (109.5 kg) Height:  5' 2.5" (158.8 cm)  BEHAVIORAL SYMPTOMS/MOOD NEUROLOGICAL BOWEL NUTRITION STATUS        Diet (Carb Modified)  AMBULATORY STATUS COMMUNICATION OF NEEDS Skin   Total Care Verbally Normal                       Personal Care Assistance Level of Assistance  Bathing, Feeding, Dressing Bathing Assistance: Maximum assistance Feeding assistance: Limited assistance Dressing Assistance: Maximum assistance     Functional Limitations Info             SPECIAL CARE FACTORS FREQUENCY  PT (By licensed PT), OT (By licensed OT)     PT Frequency: 5x OT Frequency: 5x            Contractures Contractures Info: Not present    Additional Factors Info  Code Status, Allergies Code Status Info: DNR Allergies Info: Amlodipine Besylate-valsartan           Current Medications (06/03/2017):  This is the current hospital active medication list Current Facility-Administered Medications  Medication Dose Route Frequency Provider Last Rate Last Dose  . 0.9 %  sodium chloride infusion  250 mL Intravenous PRN Doutova, Anastassia, MD      . Ampicillin-Sulbactam (UNASYN) 3 g in sodium chloride 0.9 % 100 mL IVPB  3 g Intravenous Q6H Lorenza Evangelist, [facility]   Stopped at 06/03/17 5621  . bisacodyl (DULCOLAX) suppository 10 mg  10 mg Rectal Daily PRN Doutova, Anastassia, MD      . carvedilol (COREG) tablet 6.25 mg  6.25 mg Oral  BID Therisa Doyne, MD   6.25 mg at 06/03/17 0904  . furosemide (LASIX) tablet 20 mg  20 mg Oral BID Therisa Doyne, MD   20 mg at 06/03/17 0905  . gabapentin (NEURONTIN) capsule 300 mg  300 mg Oral TID Therisa Doyne, MD   300 mg at 06/03/17 0905  . heparin ADULT infusion 100 units/mL (25000 units/266mL sodium chloride 0.45%)  1,000 Units/hr Intravenous Continuous Lorenza Evangelist, RPH 10 mL/hr at 06/03/17 0845 1,000 Units/hr at 06/03/17 0845  . HYDROcodone-acetaminophen (NORCO/VICODIN) 5-325 MG per  tablet 1 tablet  1 tablet Oral Q6H PRN Narda Bonds, MD   1 tablet at 06/03/17 0729  . methocarbamol (ROBAXIN) tablet 750 mg  750 mg Oral Q8H PRN Therisa Doyne, MD   750 mg at 06/02/17 1735  . ondansetron (ZOFRAN) tablet 4 mg  4 mg Oral Q6H PRN Doutova, Anastassia, MD       Or  . ondansetron (ZOFRAN) injection 4 mg  4 mg Intravenous Q6H PRN Doutova, Anastassia, MD      . pantoprazole (PROTONIX) EC tablet 40 mg  40 mg Oral BID AC Doutova, Anastassia, MD   40 mg at 06/03/17 0905  . polyethylene glycol (MIRALAX / GLYCOLAX) packet 17 g  17 g Oral Daily PRN Therisa Doyne, MD      . psyllium (HYDROCIL/METAMUCIL) packet 1 packet  1 packet Oral Daily Pearson Grippe, MD      . senna (SENOKOT) tablet 8.6 mg  1 tablet Oral BID Therisa Doyne, MD   8.6 mg at 06/03/17 0904  . sodium chloride flush (NS) 0.9 % injection 3 mL  3 mL Intravenous Q12H Doutova, Anastassia, MD   3 mL at 06/03/17 0905  . sodium chloride flush (NS) 0.9 % injection 3 mL  3 mL Intravenous PRN Doutova, Anastassia, MD      . verapamil (CALAN-SR) CR tablet 240 mg  240 mg Oral QHS Therisa Doyne, MD   Stopped at 06/02/17 2153     Discharge Medications: Please see discharge summary for a list of discharge medications.  Relevant Imaging Results:  Relevant Lab Results:   Additional Information SSN [id number]  [name], LCSW

## 2017-06-03 NOTE — Consult Note (Signed)
Chief Complaint: Patient was seen in consultation today for back pain  Referring Physician(s):  Dr. Toy Baker  Supervising Physician: Sandi Mariscal  Patient Status: [facility] - In-pt  History of Present Illness: Charlotte [name] is a 81 y.o. female with past medical history of a fib, CAD, DM, GERD, HLD, HTN, osteoarthritis, osteopenia who presented to [facility] ED with acute back pain 06/01/17.  She was found to have compression fracture and was sent home with outpatient follow-up, however could not manage pain at home and returned to hospital.   Of note, patient was seen in the ED for back pain 05/28/17 prior to falls and was treated for possible UTI.  Her urine culture was positive for Entercoccus.   CT Abd/Pelvis 06/02/17: 1. Acute on chronic appearing mild superior endplate compression of L3 with fracture lucencies noted anteriorly and right lateral. 2. Degenerative disc disease L1-2 and L3-4 with L4-5 and L5-S1 facet arthropathy. 3. Hepatic and splenic hypodensities too small to further characterize but statistically consistent with cysts or hemangiomata. 4. Mild cardiomegaly.  IR consulted for possible vertebroplasty/kyphoplasty. Dr. Anselm Pancoast recommended additional imaging with MR Lumbar Spin.  MR Lumbar Spine 06/03/17: 1. Acute/subacute superior endplate compression fracture at L3 with 30% loss of height anteriorly but no retropulsed bone. 2. Levoconvex scoliosis at L4-5. 3. Mild subarticular and foraminal narrowing bilaterally at L1-2. 4. Moderate right and mild left subarticular and foraminal stenosis at L2-3. 5. Mild subarticular narrowing bilaterally at L3-4 with moderate right and mild left foraminal stenosis. 6. Moderate subarticular and foraminal stenosis bilaterally at L4-5 with facet spurring contributing to the foraminal disease. 7. Asymmetric left-sided facet hypertrophy and spurring contributes to mild left foraminal stenosis at L5-S1.  Case reviewed by Dr. Pascal Lux today who  approves patient for procedure.   Past Medical History:  Diagnosis Date  . Aortic insufficiency   . Atrial fibrillation ([place])    a. s/p DCCV 03/2013  . CAD (coronary artery disease)    LHC 06/2010: EF 55%, mild plaque in the LAD 20-30%, otherwise normal coronary arteries  . CATARACTS   . Chronic systolic heart failure ([place])    in setting of AFib;  Echocardiogram 02/2013: Moderate LVH, EF 25-30%, anteroseptal and apical HK, moderate AI, MAC, moderate MR, moderate LAE, mild RAE, PASP 32 => after DCCV f/u Echo 8/14: Moderate LVH, EF 60-65%, normal wall motion, grade 1 diastolic dysfunction, moderate AI, moderate LAE  . Complication of anesthesia 2009   nausea and vomitting  . Diabetes mellitus without complication ([place])   . GERD (gastroesophageal reflux disease)   . HYPERLIPIDEMIA, MILD   . HYPERTENSION, BENIGN SYSTEMIC   . Irritable bowel syndrome   . LBBB (left bundle branch block)   . MEDIAL EPICONDYLITIS   . NSVT (nonsustained ventricular tachycardia) ([place])    during Dob Echo 2011 => normal cath  . OSTEOARTHRITIS   . OSTEOPENIA   . RHINITIS, ALLERGIC   . SYNCOPE   . VITAMIN D DEFICIENCY     Past Surgical History:  Procedure Laterality Date  . CARDIOVERSION N/A 04/01/2013   Procedure: CARDIOVERSION;  Surgeon: Josue Hector, MD;  Location: [place];  Service: Cardiovascular;  Laterality: N/A;  . CATARACT EXTRACTION, BILATERAL  2012   Dr. Herbert Deaner  . COLONOSCOPY WITH PROPOFOL N/A 05/23/2017   Procedure: COLONOSCOPY WITH PROPOFOL;  Surgeon: Wonda Horner, MD;  Location: [facility] ENDOSCOPY;  Service: Endoscopy;  Laterality: N/A;  . ESOPHAGOGASTRODUODENOSCOPY (EGD) WITH PROPOFOL N/A 05/23/2017   Procedure: ESOPHAGOGASTRODUODENOSCOPY (EGD) WITH  PROPOFOL;  Surgeon: Wonda Horner, MD;  Location[facility] ENDOSCOPY;  Service: Endoscopy;  Laterality: N/A;  . RADICAL HYSTERECTOMY    . REPLACEMENT TOTAL KNEE     11/03/07 right....... 03/29/08 left    Allergies: Amlodipine  besylate-valsartan  Medications: Prior to Admission medications   Medication Sig Start Date End Date Taking? Authorizing Provider  Acetaminophen 500 MG coapsule Take 500 mg by mouth every 6 (six) hours as needed for pain.  04/08/17  Yes [provider]  AMBULATORY NON FORMULARY MEDICATION Disp 1 walker for use as needed due to frequent falls and back pain 05/31/17  Yes Gregor Hams, MD  amoxicillin (AMOXIL) 500 MG capsule Take 1 capsule (500 mg total) by mouth 3 (three) times daily. 05/31/17 06/07/17 Yes Kandra Nicolas, MD  Calcium Carb-Cholecalciferol (CALCIUM 600/VITAMIN D3) 600-800 MG-UNIT TABS Take 1 tablet by mouth 2 (two) times daily.   Yes [provider]  carvedilol (COREG) 6.25 MG tablet TAKE 1 TABLET TWICE DAILY 05/14/17  Yes Josue Hector, MD  Cholecalciferol (VITAMIN D PO) Take 1 tablet by mouth daily.   Yes [provider]  cyclobenzaprine (FLEXERIL) 5 MG tablet Take 1 tablet (5 mg total) by mouth 3 (three) times daily as needed for muscle spasms. 05/28/17  Yes Phelps, Erin O, PA-C  furosemide (LASIX) 20 MG tablet Take 20 mg by mouth 2 (two) times daily. Except when pt's has prp treatment   Yes [provider]  gabapentin (NEURONTIN) 300 MG capsule TAKE 1 CAPSULE FOUR TIMES DAILY Patient taking differently: TAKE 300 mg CAPSULE Three TIMES DAILY 02/06/17  Yes [name], MD  iron polysaccharides (NU-IRON) 150 MG capsule Take 1 capsule (150 mg total) by mouth daily. 05/23/17  Yes Rai, Ripudeep K, MD  losartan (COZAAR) 100 MG tablet TAKE 1 TABLET EVERY DAY 03/27/17  Yes Josue Hector, MD  metFORMIN (GLUCOPHAGE) 500 MG tablet TAKE 1 TABLET TWICE DAILY WITH MEALS 05/14/17  Yes [name], MD  metolazone (ZAROXOLYN) 2.5 MG tablet TAKE 1 TABLET EVERY OTHER DAY 01/28/17  Yes Josue Hector, MD  Multiple Vitamin (MULTIVITAMIN) capsule Take 1 capsule by mouth daily.     Yes [provider]  Omega-3 Fatty Acids (FISH OIL PO) Take 1  tablet by mouth daily.   Yes [provider]  oxyCODONE (ROXICODONE) 5 MG immediate release tablet Take 1/2 to 1 pill every 6 hours as needed for severe pain. 05/29/17  Yes Gregor Hams, MD  pantoprazole (PROTONIX) 40 MG tablet Take 1 tablet (40 mg total) by mouth 2 (two) times daily before a meal. Please take twice a day before meals for 2 WEEKS, then daily thereafter 05/23/17  Yes Rai, Ripudeep K, MD  polyvinyl alcohol-povidone (REFRESH) 1.4-0.6 % ophthalmic solution Place 1-2 drops into both eyes daily as needed (FOR DRY EYES).    Yes [provider]  potassium chloride (K-DUR,KLOR-CON) 10 MEQ tablet Take 1 tablet (10 mEq total) by mouth 2 (two) times daily. 12/03/16  Yes Josue Hector, MD  psyllium (HYDROCIL/METAMUCIL) 95 % PACK Take 1 packet by mouth daily.   Yes [provider]  rivaroxaban (XARELTO) 20 MG TABS tablet TAKE 20 mg TABLET EVERY DAY WITH SUPPER, start on 05/25/17 05/25/17  Yes Rai, Ripudeep K, MD  verapamil (VERELAN PM) 240 MG 24 hr capsule TAKE 1 CAPSULE AT BEDTIME 03/27/17  Yes Josue Hector, MD  vitamin B-12 (CYANOCOBALAMIN) 1000 MCG tablet Take 1,000 mcg by mouth daily.  Yes [provider]     Family History  Problem Relation Age of Onset  . Arrhythmia Mother        afib  . Alzheimer's disease Mother   . Colon cancer Father   . Heart attack Father 21  . Diabetes Sister   . Arrhythmia Sister        afib    Social History   Social History  . Marital status: Divorced    Spouse name: N/A  . Number of children: 1  . Years of education: N/A   Occupational History  . Retired Retired   Social History Main Topics  . Smoking status: Former Smoker    Quit date: 10/08/1970  . Smokeless tobacco: Never Used  . Alcohol use No  . Drug use: No  . Sexual activity: Not Asked   Other Topics Concern  . None   Social History Narrative  . None    Review of Systems  Constitutional: Negative for fatigue and fever.  Respiratory:  Negative for cough and shortness of breath.   Cardiovascular: Negative for chest pain.  Genitourinary: Negative for frequency and urgency.  Psychiatric/Behavioral: Negative for behavioral problems and confusion.    Vital Signs: BP (!) 117/47 (BP Location: Left Arm)   Pulse 63   Temp 97.6 F (36.4 C) (Oral)   Resp 18   Ht 5' 2.5" (1.588 m)   Wt 241 lb 6.5 oz (109.5 kg)   SpO2 93%   BMI 43.45 kg/m   Physical Exam  Constitutional: She is oriented to person, place, and time. She appears well-developed.  Cardiovascular: Normal rate, regular rhythm and normal heart sounds.   Pulmonary/Chest: Effort normal and breath sounds normal. No respiratory distress.  Musculoskeletal: She exhibits tenderness (general, worse with movement.  Describes pain as worse in lower back).  Neurological: She is alert and oriented to person, place, and time.  Skin: Skin is warm and dry.  Nursing note and vitals reviewed.   Mallampati Score:  MD Evaluation Airway: WNL Heart: WNL Abdomen: WNL Chest/ Lungs: WNL ASA  Classification: 3 Mallampati/Airway Score: Two  Imaging: Dg Lumbar Spine Complete  Result Date: 06/01/2017 CLINICAL DATA:  Pain after fall. EXAM: LUMBAR SPINE - COMPLETE 4+ VIEW COMPARISON:  May 29, 2017 FINDINGS: Scoliotic curvature of the lumbar spine, apex to the left, is unchanged. Mild wedging of L3 is stable since May 09, 2017, likely chronic. No acute fracture identified. Degenerative changes are seen, most marked at L1-2. Lower lumbar facet degenerative changes are noted. No traumatic malalignment identified. Calcified atherosclerosis is noted. IMPRESSION: 1. Scoliosis and degenerative change. No acute fracture or traumatic malalignment. Electronically Signed   By: Dorise Bullion III M.D   On: 06/01/2017 07:31   Dg Lumbar Spine Complete  Result Date: 05/30/2017 CLINICAL DATA:  Low back pain, no injury EXAM: LUMBAR SPINE - COMPLETE 4+ VIEW COMPARISON:  Lumbar spine films of  05/19/2007 FINDINGS: There is little change in slight lumbar curvature convex to the left. The bones are diffusely osteopenic. Degenerative disc disease is most marked at L1-2 where there is considerable loss of joint space and sclerosis with spurring as well as vacuum disc phenomenon. No new compression deformity is seen. On oblique views no pars defect is seen and there is some degenerative change of the lower facet joints. Moderate abdominal aortic atherosclerosis is noted. IMPRESSION: 1. No change in slight curvature of the lumbar spine convex left with diffuse osteopenia. 2. Some progression of degenerative disc disease at  L1-2. Electronically Signed   By: Ivar Drape M.D.   On: 05/30/2017 08:02   Dg Abd 1 View  Result Date: 06/01/2017 CLINICAL DATA:  Abdominal discomfort.  Constipation for 2 days. EXAM: ABDOMEN - 1 VIEW COMPARISON:  None FINDINGS: Moderate fecal loading in the ascending and transverse colon. No bowel obstruction. IMPRESSION: Moderate fecal loading in the ascending and transverse colon. No other acute abnormalities Electronically Signed   By: Dorise Bullion III M.D   On: 06/01/2017 07:32   Mr Lumbar Spine Wo Contrast  Result Date: 06/03/2017 CLINICAL DATA:  L3 compression fracture. Mid back pain. Unable to ambulate. EXAM: MRI LUMBAR SPINE WITHOUT CONTRAST TECHNIQUE: Multiplanar, multisequence MR imaging of the lumbar spine was performed. No intravenous contrast was administered. COMPARISON:  CT of the abdomen and pelvis 06/02/2017. Lumbar spine radiographs 06/01/2017 and 05/29/2017. FINDINGS: Segmentation: 5 non rib-bearing lumbar type vertebral bodies are present. Alignment: Slight anterolisthesis at L4-5 is stable. AP alignment is otherwise anatomic. Levoconvex curvature of the lumbar spine is centered at L4. Vertebrae: Acute/subacute superior endplate fracture is confirmed at L3. There is 30% loss of height anteriorly. No significant retropulsed bone is present. Chronic endplate  marrow changes are present with fatty replacement on the left at L1-2. Mild chronic endplate changes are noted on the right at L3-4. There is diffuse fatty infiltration of the marrow throughout the sacrum. Conus medullaris: Extends to the L1-2 level and appears normal. Paraspinal and other soft tissues: Limited imaging of the abdomen is unremarkable. Disc levels: T12-L1: Negative. L1-2: A broad-based disc protrusion is present. Mild facet hypertrophy is noted bilaterally. This results in mild subarticular and foraminal narrowing bilaterally. L2-3: A broad-based disc protrusion is present. Moderate facet hypertrophy is worse on the right. Moderate right and mild left subarticular and foraminal narrowing is present bilaterally. L3-4: A broad-based disc protrusion is present. Moderate facet hypertrophy is noted bilaterally. Mild subarticular narrowing is noted bilaterally. Facet hypertrophy contributes to moderate right and mild left foraminal stenosis. L4-5: A broad-based disc protrusion is present. Moderate facet hypertrophy is noted bilaterally. Moderate subarticular and foraminal narrowing is present bilaterally, worse on the left. Facet spurring contributes. L5-S1: Mild facet hypertrophy is worse on the left. Spurring results in mild left foraminal narrowing. The central canal is patent. IMPRESSION: 1. Acute/subacute superior endplate compression fracture at L3 with 30% loss of height anteriorly but no retropulsed bone. 2. Levoconvex scoliosis at L4-5. 3. Mild subarticular and foraminal narrowing bilaterally at L1-2. 4. Moderate right and mild left subarticular and foraminal stenosis at L2-3. 5. Mild subarticular narrowing bilaterally at L3-4 with moderate right and mild left foraminal stenosis. 6. Moderate subarticular and foraminal stenosis bilaterally at L4-5 with facet spurring contributing to the foraminal disease. 7. Asymmetric left-sided facet hypertrophy and spurring contributes to mild left foraminal  stenosis at L5-S1. Electronically Signed   By: San Morelle M.D.   On: 06/03/2017 08:40   Ct Abdomen Pelvis W Contrast  Result Date: 06/02/2017 CLINICAL DATA:  Progressive low back pain, recent admission 05/21/2017 for gastrointestinal bleeding. EXAM: CT ABDOMEN AND PELVIS WITH CONTRAST TECHNIQUE: Multidetector CT imaging of the abdomen and pelvis was performed using the standard protocol following bolus administration of intravenous contrast. CONTRAST:  174m ISOVUE-300 IOPAMIDOL (ISOVUE-300) INJECTION 61% COMPARISON:  Abdomen and lumbar spine radiographs dating back through 05/19/2007 FINDINGS: Lower chest: Cardiomegaly. No pericardial effusion. Minimal bibasilar atelectasis. No pneumonic consolidation or pneumothorax. No pleural effusion. Hepatobiliary: The at least 3 subcentimeter hypodensities are noted within the liver, two  in the right hepatic lobe and one on the left statistically consistent with cysts or hemangiomata. Pancreas: Unremarkable. No pancreatic ductal dilatation or surrounding inflammatory changes. Spleen: No splenomegaly. Nonspecific 8 mm hypodensity is noted possibly representing small hemangioma or hematopoietic rest. Adrenals/Urinary Tract: Adrenal glands are unremarkable. Kidneys are normal, without renal calculi, focal lesion, or hydronephrosis. Bladder is unremarkable. Stomach/Bowel: Stomach is within normal limits. Appendix appears normal. No evidence of bowel wall thickening, distention, or inflammatory changes. Vascular/Lymphatic: Aortic atherosclerosis. No enlarged abdominal or pelvic lymph nodes. Reproductive: Status post hysterectomy. No adnexal masses. Other: None Musculoskeletal: Recent appearing superior endplate compression of L3 slightly more accentuated than on prior studies dating back 2008. No retropulsion is seen. The fractures are seen along the anterior right lateral aspect of the vertebral body. Chronic stable degenerative disc disease L1-2 and L3-4. Lower  lumbar facet arthropathy is identified with facet joint space narrowing and hypertrophy at L4-5 and L5-S1. IMPRESSION: 1. Acute on chronic appearing mild superior endplate compression of L3 with fracture lucencies noted anteriorly and right lateral. 2. Degenerative disc disease L1-2 and L3-4 with L4-5 and L5-S1 facet arthropathy. 3. Hepatic and splenic hypodensities too small to further characterize but statistically consistent with cysts or hemangiomata. 4. Mild cardiomegaly. Electronically Signed   By: Ashley Royalty M.D.   On: 06/02/2017 01:10    Labs:  CBC:  Recent Labs  05/29/17 1543 06/01/17 0735 06/02/17 0517 06/03/17 0144  WBC 9.5 7.6 8.0 7.6  HGB 10.5* 9.9* 9.8* 9.0*  HCT 34.0* 31.3* 30.8* 28.3*  PLT 304 244 262 245    COAGS:  Recent Labs  06/03/17 0144 06/03/17 1344  APTT 144* 60*    BMP:  Recent Labs  06/01/17 0735 06/01/17 2351 06/02/17 0517 06/03/17 0919  NA 133* 135 137 134*  K 2.6* 3.2* 3.0* 3.0*  CL 93* 95* 96* 97*  CO2 32 29 32 28  GLUCOSE 128* 129* 121* 111*  BUN 39* 30* 36* 42*  CALCIUM 9.2 9.4 9.2 8.7*  CREATININE 0.82 0.85 0.90 1.01*  GFRNONAA >60 >60 59* 52*  GFRAA >60 >60 >60 60*    LIVER FUNCTION TESTS:  Recent Labs  03/01/17 1148 06/01/17 2351 06/02/17 0517  BILITOT 0.5 0.8 0.7  AST _0 ALT _1 ALKPHOS 73 67 63  PROT 6.8 7.2 6.8  ALBUMIN 3.7 3.4* 3.2*    TUMOR MARKERS: No results for input(s): AFPTM, CEA, CA199, CHROMGRNA in the last 8760 hours.  Assessment and Plan: Patient with past medical history of CAD, HTN, DM, arthritis presents with complaint of acute onset back pain.  IR consulted for vertebroplasty/kyphoplasty at the request of Dr. Roel Cluck. Case reviewed by Dr. Pascal Lux who approves patient for procedure.  Case has been submitted for insurance approval which is expected mid-week.  Looking ahead at schedule, patient may require transfer to [facility] for procedure.   Continue treatment for UTI.   Patient did have a  negative urine culture on admission 8/25.  Family states they cannot take patient home in her current level of pain and debility.  Risks and benefits discussed with the patient including, but not limited to education regarding the natural healing process of compression fractures without intervention, bleeding, infection, cement migration which may cause spinal cord damage, paralysis, pulmonary embolism or even death. All of the patient's questions were answered, patient is agreeable to proceed. Consent signed and in chart.  Continue to work towards Biochemist, clinical and scheduling procedure.   Thank you  for this interesting consult.  I greatly enjoyed meeting Charlotte [name] and look forward to participating in their care.  A copy of this report was sent to the requesting provider on this date.  Electronically Signed: Docia Barrier, PA 06/03/2017, 4:11 PM   I spent a total of 40 Minutes    in face to face in clinical consultation, greater than 50% of which was counseling/coordinating care for compression fracture L3

## 2017-06-03 NOTE — Telephone Encounter (Signed)
Please tell her I'm so sorry. I hope she recovers quickly. I do have her FMLA paperwork and started to complete it but had a couple of questions. How much time if she needing to spend with her mother. Has she been missing full days of work or just mostly taking her to appointments etc.

## 2017-06-03 NOTE — Telephone Encounter (Signed)
FYI: Daughter, [name] called.She states Mom is in the hospital for Compression fracture - fallen twice at home.  Thank you

## 2017-06-03 NOTE — Progress Notes (Signed)
ANTICOAGULATION CONSULT NOTE - f/u Consult  Pharmacy Consult for Heparin (PTA xarelto) \Indication: atrial fibrillation  Allergies  Allergen Reactions  . Amlodipine Besylate-Valsartan Other (See Comments)    HEADACHE     Patient Measurements: Height: 5' 2.5" (158.8 cm) Weight: 241 lb 6.5 oz (109.5 kg) IBW/kg (Calculated) : 51.25 Heparin Dosing Weight: 78  Vital Signs: Temp: 98.4 F (36.9 C) (08/27 0043) Temp Source: Oral (08/27 0043) BP: 114/49 (08/27 0043) Pulse Rate: 67 (08/27 0043)  Labs:  Recent Labs  06/01/17 0735 06/01/17 2351 06/02/17 0517 06/03/17 0144  HGB 9.9*  --  9.8* 9.0*  HCT 31.3*  --  30.8* 28.3*  PLT 244  --  262 245  APTT  --   --   --  144*  HEPARINUNFRC  --   --   --  >2.20*  CREATININE 0.82 0.85 0.90  --     Estimated Creatinine Clearance: 59.7 mL/min (by C-G formula based on SCr of 0.9 mg/dL).   Medical History: Past Medical History:  Diagnosis Date  . Aortic insufficiency   . Atrial fibrillation ([place])    a. s/p DCCV 03/2013  . CAD (coronary artery disease)    LHC 06/2010: EF 55%, mild plaque in the LAD 20-30%, otherwise normal coronary arteries  . CATARACTS   . Chronic systolic heart failure ([place])    in setting of AFib;  Echocardiogram 02/2013: Moderate LVH, EF 25-30%, anteroseptal and apical HK, moderate AI, MAC, moderate MR, moderate LAE, mild RAE, PASP 32 => after DCCV f/u Echo 8/14: Moderate LVH, EF 60-65%, normal wall motion, grade 1 diastolic dysfunction, moderate AI, moderate LAE  . Complication of anesthesia 2009   nausea and vomitting  . Diabetes mellitus without complication ([place])   . GERD (gastroesophageal reflux disease)   . HYPERLIPIDEMIA, MILD   . HYPERTENSION, BENIGN SYSTEMIC   . Irritable bowel syndrome   . LBBB (left bundle branch block)   . MEDIAL EPICONDYLITIS   . NSVT (nonsustained ventricular tachycardia) ([place])    during Dob Echo 2011 => normal cath  . OSTEOARTHRITIS   . OSTEOPENIA   . RHINITIS, ALLERGIC   .  SYNCOPE   . VITAMIN D DEFICIENCY     Medications:  Prescriptions Prior to Admission  Medication Sig Dispense Refill Last Dose  . Acetaminophen 500 MG coapsule Take 500 mg by mouth every 6 (six) hours as needed for pain.    05/31/2017 at Unknown time  . AMBULATORY NON FORMULARY MEDICATION Disp 1 walker for use as needed due to frequent falls and back pain 1 each 0 Past Month at Unknown time  . amoxicillin (AMOXIL) 500 MG capsule Take 1 capsule (500 mg total) by mouth 3 (three) times daily. 21 capsule 0 06/01/2017 at Unknown time  . Calcium Carb-Cholecalciferol (CALCIUM 600/VITAMIN D3) 600-800 MG-UNIT TABS Take 1 tablet by mouth 2 (two) times daily.   Past Week at Unknown time  . carvedilol (COREG) 6.25 MG tablet TAKE 1 TABLET TWICE DAILY 180 tablet 1 06/01/2017 at 5:15pm  . Cholecalciferol (VITAMIN D PO) Take 1 tablet by mouth daily.   Past Week at Unknown time  . cyclobenzaprine (FLEXERIL) 5 MG tablet Take 1 tablet (5 mg total) by mouth 3 (three) times daily as needed for muscle spasms. 15 tablet 0 06/01/2017 at Unknown time  . furosemide (LASIX) 20 MG tablet Take 20 mg by mouth 2 (two) times daily. Except when pt's has prp treatment   06/01/2017 at Unknown time  . gabapentin (NEURONTIN) 300  MG capsule TAKE 1 CAPSULE FOUR TIMES DAILY (Patient taking differently: TAKE 300 mg CAPSULE Three TIMES DAILY) 360 capsule 1 06/01/2017 at Unknown time  . iron polysaccharides (NU-IRON) 150 MG capsule Take 1 capsule (150 mg total) by mouth daily. 30 capsule 3 06/01/2017 at Unknown time  . losartan (COZAAR) 100 MG tablet TAKE 1 TABLET EVERY DAY 90 tablet 2 06/01/2017 at Unknown time  . metFORMIN (GLUCOPHAGE) 500 MG tablet TAKE 1 TABLET TWICE DAILY WITH MEALS 180 tablet 1 05/31/2017 at Unknown time  . metolazone (ZAROXOLYN) 2.5 MG tablet TAKE 1 TABLET EVERY OTHER DAY 45 tablet 3 05/31/2017 at Unknown time  . Multiple Vitamin (MULTIVITAMIN) capsule Take 1 capsule by mouth daily.     Past Week at Unknown time  . Omega-3  Fatty Acids (FISH OIL PO) Take 1 tablet by mouth daily.   Past Week at Unknown time  . oxyCODONE (ROXICODONE) 5 MG immediate release tablet Take 1/2 to 1 pill every 6 hours as needed for severe pain. 15 tablet 0 06/01/2017 at Unknown time  . pantoprazole (PROTONIX) 40 MG tablet Take 1 tablet (40 mg total) by mouth 2 (two) times daily before a meal. Please take twice a day before meals for 2 WEEKS, then daily thereafter 60 tablet 4 06/01/2017 at Unknown time  . polyvinyl alcohol-povidone (REFRESH) 1.4-0.6 % ophthalmic solution Place 1-2 drops into both eyes daily as needed (FOR DRY EYES).    Past Week at Unknown time  . potassium chloride (K-DUR,KLOR-CON) 10 MEQ tablet Take 1 tablet (10 mEq total) by mouth 2 (two) times daily. 180 tablet 3 05/31/2017 at Unknown time  . psyllium (HYDROCIL/METAMUCIL) 95 % PACK Take 1 packet by mouth daily.   06/01/2017 at Unknown time  . rivaroxaban (XARELTO) 20 MG TABS tablet TAKE 20 mg TABLET EVERY DAY WITH SUPPER, start on 05/25/17 90 tablet 3 06/01/2017 at 5:15pm  . verapamil (VERELAN PM) 240 MG 24 hr capsule TAKE 1 CAPSULE AT BEDTIME 90 capsule 2 05/31/2017 at Unknown time  . vitamin B-12 (CYANOCOBALAMIN) 1000 MCG tablet Take 1,000 mcg by mouth daily.     Past Week at Unknown time   Scheduled:  . carvedilol  6.25 mg Oral BID  . furosemide  20 mg Oral BID  . gabapentin  300 mg Oral TID  . pantoprazole  40 mg Oral BID AC  . senna  1 tablet Oral BID  . sodium chloride flush  3 mL Intravenous Q12H  . verapamil  240 mg Oral QHS   Infusions:  . sodium chloride    . ampicillin-sulbactam (UNASYN) IV 3 g (06/03/17 0200)  . heparin      Assessment: 81yo F admitted with back pain. On chronic Xarelto for paroxysmal A.fib, last dose taken 8/25 at 17:15. Pharmacy is asked to start heparin drip while holding oral anticoag.  Today 8/27 0144 HL=>2.20 and aPtt = 144 sec above goal.  No baseline HL or aPtt drawn were drawn.  No problems reported by RN.    Goal of Therapy:   APtt=66-102 Heparin level 0.3-0.7 units/ml Monitor platelets by anticoagulation protocol: Yes   Plan:  Hold heparin drip x 1 hour At 0500 restart heparin at 1000 units/hr Check aptt 8 hours after heparin restarted Will use aptt to follow heparin until effects of xarelto on HL are diminished Check heparin level daily. Check CBC q24h while on heparin. F/u daily.  Lawana Pai R . 06/03/2017,3:58 AM.

## 2017-06-03 NOTE — Progress Notes (Signed)
ANTICOAGULATION CONSULT NOTE - f/u Consult  Pharmacy Consult for Heparin (PTA xarelto) \Indication: atrial fibrillation  Allergies  Allergen Reactions  . Amlodipine Besylate-Valsartan Other (See Comments)    HEADACHE     Patient Measurements: Height: 5' 2.5" (158.8 cm) Weight: 241 lb 6.5 oz (109.5 kg) IBW/kg (Calculated) : 51.25 Heparin Dosing Weight: 78 kg  Vital Signs: Temp: 97.6 F (36.4 C) (08/27 0357) Temp Source: Oral (08/27 0357) BP: 117/47 (08/27 0357) Pulse Rate: 63 (08/27 0357)  Labs:  Recent Labs  06/01/17 0735 06/01/17 2351 06/02/17 0517 06/03/17 0144 06/03/17 0919 06/03/17 1344  HGB 9.9*  --  9.8* 9.0*  --   --   HCT 31.3*  --  30.8* 28.3*  --   --   PLT 244  --  262 245  --   --   APTT  --   --   --  144*  --  60*  HEPARINUNFRC  --   --   --  >2.20*  --   --   CREATININE 0.82 0.85 0.90  --  1.01*  --     Estimated Creatinine Clearance: 53.2 mL/min (A) (by C-G formula based on SCr of 1.01 mg/dL (H)).   Assessment: 81yo F admitted with back pain secondary to compression fracture of L3. Patient on chronic Xarelto for paroxysmal A.fib, last dose taken 8/25 at 17:15. Pharmacy is asked to start heparin drip while holding oral anticoagulation for possible vertebroplasty or kyphoplasty.  Today, 06/03/17:    1344 aPTT = 60 seconds, subtherapeutic on heparin infusion at 1000 units/hr  Of note, heparin infusion was held from 0731 to 0845 for MRI  CBC: Hgb 9, low but stable. Pltc WNL.   No bleeding or line issues reported per nursing.   Goal of Therapy:  aPTT 66-102 seconds Heparin level 0.3-0.7 units/ml Monitor platelets by anticoagulation protocol: Yes   Plan:   Adjust heparin infusion to 1100 units/hr  Will use aPTT for now for adjustment of heparin infusion until effects of Xarelto on heparin levels have diminished  Check aPTT 8 hours after rate change  Daily CBC and heparin level   Monitor closely for s/sx of bleeding  F/u timing of  procedure (if indicated) and when to stop heparin prior.    Greer Pickerel, PharmD, BCPS Pager: [phone] 06/03/2017 3:13 PM

## 2017-06-03 NOTE — Clinical Social Work Note (Signed)
Clinical Social Work Assessment  Patient Details  Name: Charlotte [name] MRN: [id number] Date of Birth: [birth year]  Date of referral:  06/03/17               Reason for consult:  Facility Placement                Permission sought to share information with:  Facility Industrial/product designer granted to share information::  Yes, Verbal Permission Granted  Name::        Agency::     Relationship::     Contact Information:     Housing/Transportation Living arrangements for the past 2 months:  Single Family Home Source of Information:  Patient, Other (Comment Required) (Daughter Building control surveyor) Patient Interpreter Needed:  None Criminal Activity/Legal Involvement Pertinent to Current Situation/Hospitalization:  No - Comment as needed Significant Relationships:  Adult Children, Other Family Members Lives with:  Self Do you feel safe going back to the place where you live?  No Need for family participation in patient care:  Yes (Comment)  Care giving concerns:  Patient was recently assessed in the ED on 8/25 after coming in with weakness after two falls. Patient was discharged home and EMS was unable to get patient into her home and brought her back to the ED. Patient has been admitted under observation on 8/25 and then admitted to inpatient on 8/26 for back pain and L3 vertebral compression fracture. PT recommended SNF for ST rehab, patient is agreeable.    Social Worker assessment / plan:  CSW spoke with patient and patient's daughter at bedside regarding PT recommendation for ST rehab at [facility]. Patient reported that she is agreeable and has been to [facility] in the past. CSW explained insurance authorization process. Patient reported that she is interested in returning [facility] or going to [facility].  CSW completed FL2 and will provide bed offers. CSW agreed to follow up with patient tomorrow for SNF selection so insurance authorization process can be started by selected  facility.   Employment status:  Retired Database administrator PT Recommendations:  Skilled Nursing Facility Information / Referral to community resources:  Skilled Nursing Facility  Patient/Family's Response to care:  Patient/patient's daughter agreeable to ST rehab at [facility].   Patient/Family's Understanding of and Emotional Response to Diagnosis, Current Treatment, and Prognosis:  Patient/patient's daughter verbalized understanding of patient's diagnosis and current treatment. Patient's daughter reported that they are waiting to see if patient will have a procedure for her compression fracture. Patient reported that her goal is to regain independence and return home after ST rehab.   Emotional Assessment Appearance:  Appears stated age Attitude/Demeanor/Rapport:  Other (Cooperative) Affect (typically observed):  Pleasant Orientation:  Oriented to Self, Oriented to Place, Oriented to  Time, Oriented to Situation Alcohol / Substance use:  Not Applicable Psych involvement (Current and /or in the community):  No (Comment)  Discharge Needs  Concerns to be addressed:  No discharge needs identified Readmission within the last 30 days:  Yes Current discharge risk:  None Barriers to Discharge:  Continued Medical Work up, [employer], LCSW 06/03/2017, 3:51 PM

## 2017-06-03 NOTE — Evaluation (Signed)
Occupational Therapy Evaluation Patient Details Name: Charlotte [name] MRN: [id number] DOB: October 11, 1935 Today's Date: 06/03/2017    History of Present Illness 81 y.o. female with medical history significant of paroxysmal Afibon xarelto, CAD, DM,and chronic diastolic CHF, hypertension, GERD, spinal stenosis admitted with B hip/knee pain x 3 weeks, 2 falls just PTA, L3 compression fx. (more details: Charlotte [name] is a 81 y.o. female presenting to ED after just being released from ED and unable to even move from stretcher to her bed at home, so she was brought back. She recently was seen at Urgent Care for worsening back pain over last 3 weeks, then fell on 8/23 and 8/24 with even greater severe pain in her mid back. She had a recent  admisson to the hospital from 05/21/17 to 05/23/17 for a GI bleed. Hx of chronic back pain and bilateral knee replacement about 10 years ago.  She lives alone and typically walks with a cane at home but since last few weeks she has been using her walker. )   Clinical Impression   Pt admitted with back pain. Pt currently with functional limitations due to the deficits listed below (see OT Problem List). Pt will benefit from skilled OT to increase their safety and independence with ADL and functional mobility for ADL to facilitate discharge to venue listed below.      Follow Up Recommendations  SNF    Equipment Recommendations  None recommended by OT       Precautions / Restrictions Precautions Precautions: Fall;Back Required Braces or Orthoses: Spinal Brace Spinal Brace: Thoracolumbosacral orthotic (use when ambulating )      Mobility Bed Mobility Overal bed mobility: Needs Assistance Bed Mobility: Rolling   Sidelying to sit: Mod assist     Sit to sidelying: Mod assist General bed mobility comments: encouraged use of bed rail and log rolling  Transfers                 General transfer comment: did not perform        ADL either performed or  assessed with clinical judgement   ADL Overall ADL's : Needs assistance/impaired Eating/Feeding: Set up;Bed level   Grooming: Brushing hair;Bed level;Minimal assistance   Upper Body Bathing: Moderate assistance;Bed level   Lower Body Bathing: Total assistance;Bed level   Upper Body Dressing : Minimal assistance;Bed level   Lower Body Dressing: Total assistance;Bed level                 General ADL Comments: Bed bath performed in bed as well as BM     Vision Baseline Vision/History: No visual deficits Patient Visual Report: No change from baseline              Pertinent Vitals/Pain Pain Assessment: 0-10 Pain Score: 6  Pain Location: back after bed mobility Pain Descriptors / Indicators: Sore;Discomfort Pain Intervention(s): Limited activity within patient's tolerance;Repositioned     Hand Dominance     Extremity/Trunk Assessment Upper Extremity Assessment Upper Extremity Assessment: Overall WFL for tasks assessed           Communication Communication Communication: No difficulties   Cognition Arousal/Alertness: Awake/alert Behavior During Therapy: WFL for tasks assessed/performed Overall Cognitive Status: Within Functional Limits for tasks assessed  Home Living Family/patient expects to be discharged to:: Private residence Living Arrangements: Alone Available Help at Discharge: Family (dtr is a Engineer, civil (consulting) at [facility] , but can be off initally to help) Type of Home: House Home Access: Stairs to enter;Ramped entrance Entrance Stairs-Number of Steps: 1   Home Layout: One level         Bathroom Toilet: Standard     Home Equipment: Walker - 4 wheels;Walker - 2 wheels;Cane - single point;Toilet riser (* RW is bent due to her fall, daughter was going to have to go to [facility] to get new order to take to [facility]. )          Prior Functioning/Environment Level of  Independence: Independent        Comments: prior to 3 weeks ago, independent and dtr just helped with some fo the grocery shopping and some meals         OT Problem List: Decreased strength;Pain;Decreased knowledge of use of DME or AE;Decreased activity tolerance      OT Treatment/Interventions: Self-care/ADL training;Patient/family education;DME and/or AE instruction    OT Goals(Current goals can be found in the care plan section) Acute Rehab OT Goals Patient Stated Goal: I want to get this better  OT Goal Formulation: With patient Time For Goal Achievement: 06/10/17  OT Frequency: Min 2X/week              AM-PAC PT "6 Clicks" Daily Activity     Outcome Measure Help from another person eating meals?: None Help from another person taking care of personal grooming?: A Little Help from another person toileting, which includes using toliet, bedpan, or urinal?: Total Help from another person bathing (including washing, rinsing, drying)?: A Lot Help from another person to put on and taking off regular upper body clothing?: A Little Help from another person to put on and taking off regular lower body clothing?: Total 6 Click Score: 14   End of Session Equipment Utilized During Treatment: Rolling walker Nurse Communication: Mobility status  Activity Tolerance: Patient limited by pain Patient left: with call bell/phone within reach;in bed;with family/visitor present  OT Visit Diagnosis: Pain;Muscle weakness (generalized) (M62.81);Unsteadiness on feet (R26.81) Pain - part of body:  (back)                Time: 4098-1191 OT Time Calculation (min): 39 min Charges:  OT General Charges $OT Visit: 1 Visit OT Evaluation $OT Eval Moderate Complexity: 1 Mod OT Treatments $Self Care/Home Management : 23-37 mins G-Codes:     Zyaire Mccleod, [place] Kung 06/03/2017, 1:19 PM

## 2017-06-03 NOTE — Progress Notes (Signed)
Patient ID: Charlotte [name], female   DOB: Mar 14, 1936, 81 y.o.   MRN: [id number]                                                                PROGRESS NOTE                                                                                                                                                                                                             Patient Demographics:    Charlotte [name], is a 81 y.o. female, DOB - [birth year], WUJ:[id number]  Admit date - 06/01/2017   Admitting Physician Eduard Clos, MD  Outpatient Primary MD for the patient is Agapito Games, MD  LOS - 1  Outpatient Specialists:     Chief Complaint  Patient presents with  . Back Pain  . Dc/ReAdmit       Brief Narrative     81 y.o. female with a history of atrial fibrillation on Xarelto, CAD, diabetes mellitus, chronic diastolic heart failure, essential hypertension, GERD. Patient presented with back pain and falls and was found to have an L3 vertebral compression fracture. Patient has been managed on analgesics. PT evaluation and interventional radiology evaluation pending.    Subjective:    Charlotte [name] today continues to have back pain   Going to MRI this am  No headache, No chest pain, No abdominal pain - No Nausea, No new weakness tingling or numbness, No Cough - SOB.    Assessment  & Plan :    Active Problems:   HYPERTENSION, BENIGN SYSTEMIC   Long term current use of anticoagulant therapy   Diabetes mellitus type 2 in obese [facility])   Atrial fibrillation (HCC)   Symptomatic anemia   Low back pain   Intractable low back pain   Falls   Intractable back pain   Compression fracture of L3 lumbar vertebra (HCC)   Low back pain Secondary to compression fracture of L3. -Norco -TLSO brace -IR consult pending, appreciate input. To see if can have vertebroplasty or kyphoplasty -PT eval pending -Continue Robaxin when necessary  Chronic atrial fibrillation CHA2DS2-VASc Scoreis 5. On  Xarelto as an outpatient. -Start heparin drip while off of Xarelto  Enterococcus UTI -Continue Unasyn   Peripheral neuropathy -Continue gabapentin  Frequent falls Secondary to  pain in addition to neuropathy. Blood pressure could be contributing. -PT eval pending  Diabetes mellitus type 2 with peripheral neuropathy -Continue sliding-scale insulin  Essential hypertension -Continue carvedilol -discontinue losartan secondary to soft blood pressures  Anemia Stable. Microcytic. With chronic GI bleeding.  Hypokalemia -Potassium supplementation -Repeat BMP in the morning  History of GI bleed -Continue PPI   DVT prophylaxis: Heparin drip Code Status: DO NOT RESUSCITATE Family Communication: Daughter at bedside Disposition Plan: Discharge to home versus SNF pending PT evaluation   Consultants:   Interventional radiology  Procedures:   Heparin drip (8/26>>  Antimicrobials:  Unasyn (8/25>>      Lab Results  Component Value Date   PLT 245 06/03/2017      Anti-infectives    Start     Dose/Rate Route Frequency Ordered Stop   06/02/17 0200  Ampicillin-Sulbactam (UNASYN) 3 g in sodium chloride 0.9 % 100 mL IVPB     3 g 200 mL/hr over 30 Minutes Intravenous Every 6 hours 06/02/17 0057          Objective:   Vitals:   06/02/17 1423 06/02/17 2106 06/03/17 0043 06/03/17 0357  BP: (!) 96/36 (!) 96/51 (!) 114/49 (!) 117/47  Pulse: 68 70 67 63  Resp: 18 15 16 18   Temp: 98.2 F (36.8 C) 98 F (36.7 C) 98.4 F (36.9 C) 97.6 F (36.4 C)  TempSrc: Oral Oral Oral Oral  SpO2: 92% 93% 94% 93%  Weight:      Height:        Wt Readings from Last 3 Encounters:  06/02/17 109.5 kg (241 lb 6.5 oz)  05/23/17 111.1 kg (245 lb)  05/21/17 112.6 kg (248 lb 3.2 oz)     Intake/Output Summary (Last 24 hours) at 06/03/17 0751 Last data filed at 06/03/17 0700  Gross per 24 hour  Intake           499.07 ml  Output             1600 ml  Net         -1100.93  ml     Physical Exam  Awake Alert, Oriented X 3, No new F.N deficits, Normal affect [place].AT,PERRAL Supple Neck,No JVD, No cervical lymphadenopathy appriciated.  Symmetrical Chest wall movement, Good air movement bilaterally, CTAB RRR,No Gallops,Rubs or new Murmurs, No Parasternal Heave +ve B.Sounds, Abd Soft, No tenderness, No organomegaly appriciated, No rebound - guarding or rigidity. No Cyanosis, Clubbing or edema, No new Rash or bruise  SLR negative    Data Review:    CBC  Recent Labs Lab 05/29/17 1543 06/01/17 0735 06/02/17 0517 06/03/17 0144  WBC 9.5 7.6 8.0 7.6  HGB 10.5* 9.9* 9.8* 9.0*  HCT 34.0* 31.3* 30.8* 28.3*  PLT 304 244 262 245  MCV 79.8* 77.5* 77.2* 78.8  MCH 24.6* 24.5* 24.6* 25.1*  MCHC 30.9* 31.6 31.8 31.8  RDW 18.3* 18.3* 18.5* 19.1*  LYMPHSABS  --  1.7  --   --   MONOABS  --  0.7  --   --   EOSABS  --  0.1  --   --   BASOSABS  --  0.0  --   --     Chemistries   Recent Labs Lab 06/01/17 0735 06/01/17 2351 06/02/17 0517  NA 133* 135 137  K 2.6* 3.2* 3.0*  CL 93* 95* 96*  CO2 32 29 32  GLUCOSE 128* 129* 121*  BUN 39* 30* 36*  CREATININE 0.82 0.85 0.90  CALCIUM 9.2  9.4 9.2  MG 1.8  --  1.7  AST  --  25 18  ALT  --  16 15  ALKPHOS  --  67 63  BILITOT  --  0.8 0.7   ------------------------------------------------------------------------------------------------------------------ No results for input(s): CHOL, HDL, LDLCALC, TRIG, CHOLHDL, LDLDIRECT in the last 72 hours.  Lab Results  Component Value Date   HGBA1C 5.6 02/25/2017   ------------------------------------------------------------------------------------------------------------------  Recent Labs  06/02/17 0517  TSH 2.507   ------------------------------------------------------------------------------------------------------------------ No results for input(s): VITAMINB12, FOLATE, FERRITIN, TIBC, IRON, RETICCTPCT in the last 72 hours.  Coagulation profile No results  for input(s): INR, PROTIME in the last 168 hours.  No results for input(s): DDIMER in the last 72 hours.  Cardiac Enzymes No results for input(s): CKMB, TROPONINI, MYOGLOBIN in the last 168 hours.  Invalid input(s): CK ------------------------------------------------------------------------------------------------------------------ No results found for: BNP  Inpatient Medications  Scheduled Meds: . carvedilol  6.25 mg Oral BID  . furosemide  20 mg Oral BID  . gabapentin  300 mg Oral TID  . pantoprazole  40 mg Oral BID AC  . senna  1 tablet Oral BID  . sodium chloride flush  3 mL Intravenous Q12H  . verapamil  240 mg Oral QHS   Continuous Infusions: . sodium chloride    . ampicillin-sulbactam (UNASYN) IV Stopped (06/03/17 0230)  . heparin Stopped (06/03/17 0731)   PRN Meds:.sodium chloride, bisacodyl, HYDROcodone-acetaminophen, methocarbamol, ondansetron **OR** ondansetron (ZOFRAN) IV, polyethylene glycol, sodium chloride flush  Micro Results Recent Results (from the past 240 hour(s))  Urine culture     Status: None   Collection Time: 05/28/17  6:33 PM  Result Value Ref Range Status   Culture ENTEROCOCCUS SPECIES  Final   Colony Count Greater than 100,000 CFU/mL  Final   Organism ID, Bacteria ENTEROCOCCUS SPECIES  Final      Susceptibility   Enterococcus species -  (no method available)    AMPICILLIN <=2 Sensitive     LEVOFLOXACIN 0.5 Sensitive     NITROFURANTOIN <=16 Sensitive     VANCOMYCIN 1 Sensitive     TETRACYCLINE >=16 Resistant   Urine culture     Status: None   Collection Time: 06/01/17  6:48 AM  Result Value Ref Range Status   Specimen Description URINE, RANDOM  Final   Special Requests NONE  Final   Culture   Final    NO GROWTH Performed at [facility], [address], [place], [place] [address]    Report Status 06/02/2017 FINAL  Final    Radiology Reports Dg Lumbar Spine Complete  Result Date: 06/01/2017 CLINICAL DATA:  Pain after fall.  EXAM: LUMBAR SPINE - COMPLETE 4+ VIEW COMPARISON:  May 29, 2017 FINDINGS: Scoliotic curvature of the lumbar spine, apex to the left, is unchanged. Mild wedging of L3 is stable since May 09, 2017, likely chronic. No acute fracture identified. Degenerative changes are seen, most marked at L1-2. Lower lumbar facet degenerative changes are noted. No traumatic malalignment identified. Calcified atherosclerosis is noted. IMPRESSION: 1. Scoliosis and degenerative change. No acute fracture or traumatic malalignment. Electronically Signed   By: Gerome Sam III M.D   On: 06/01/2017 07:31   Dg Lumbar Spine Complete  Result Date: 05/30/2017 CLINICAL DATA:  Low back pain, no injury EXAM: LUMBAR SPINE - COMPLETE 4+ VIEW COMPARISON:  Lumbar spine films of 05/19/2007 FINDINGS: There is little change in slight lumbar curvature convex to the left. The bones are diffusely osteopenic. Degenerative disc disease is most marked  at L1-2 where there is considerable loss of joint space and sclerosis with spurring as well as vacuum disc phenomenon. No new compression deformity is seen. On oblique views no pars defect is seen and there is some degenerative change of the lower facet joints. Moderate abdominal aortic atherosclerosis is noted. IMPRESSION: 1. No change in slight curvature of the lumbar spine convex left with diffuse osteopenia. 2. Some progression of degenerative disc disease at L1-2. Electronically Signed   By: Dwyane Dee M.D.   On: 05/30/2017 08:02   Dg Abd 1 View  Result Date: 06/01/2017 CLINICAL DATA:  Abdominal discomfort.  Constipation for 2 days. EXAM: ABDOMEN - 1 VIEW COMPARISON:  None FINDINGS: Moderate fecal loading in the ascending and transverse colon. No bowel obstruction. IMPRESSION: Moderate fecal loading in the ascending and transverse colon. No other acute abnormalities Electronically Signed   By: Gerome Sam III M.D   On: 06/01/2017 07:32   Ct Abdomen Pelvis W Contrast  Result Date:  06/02/2017 CLINICAL DATA:  Progressive low back pain, recent admission 05/21/2017 for gastrointestinal bleeding. EXAM: CT ABDOMEN AND PELVIS WITH CONTRAST TECHNIQUE: Multidetector CT imaging of the abdomen and pelvis was performed using the standard protocol following bolus administration of intravenous contrast. CONTRAST:  ISOVUE-300 IOPAMIDOL (ISOVUE-300) INJECTION 61% COMPARISON:  Abdomen and lumbar spine radiographs dating back through 05/19/2007 FINDINGS: Lower chest: Cardiomegaly. No pericardial effusion. Minimal bibasilar atelectasis. No pneumonic consolidation or pneumothorax. No pleural effusion. Hepatobiliary: The at least 3 subcentimeter hypodensities are noted within the liver, two in the right hepatic lobe and one on the left statistically consistent with cysts or hemangiomata. Pancreas: Unremarkable. No pancreatic ductal dilatation or surrounding inflammatory changes. Spleen: No splenomegaly. Nonspecific 8 mm hypodensity is noted possibly representing small hemangioma or hematopoietic rest. Adrenals/Urinary Tract: Adrenal glands are unremarkable. Kidneys are normal, without renal calculi, focal lesion, or hydronephrosis. Bladder is unremarkable. Stomach/Bowel: Stomach is within normal limits. Appendix appears normal. No evidence of bowel wall thickening, distention, or inflammatory changes. Vascular/Lymphatic: Aortic atherosclerosis. No enlarged abdominal or pelvic lymph nodes. Reproductive: Status post hysterectomy. No adnexal masses. Other: None Musculoskeletal: Recent appearing superior endplate compression of L3 slightly more accentuated than on prior studies dating back 2008. No retropulsion is seen. The fractures are seen along the anterior right lateral aspect of the vertebral body. Chronic stable degenerative disc disease L1-2 and L3-4. Lower lumbar facet arthropathy is identified with facet joint space narrowing and hypertrophy at L4-5 and L5-S1. IMPRESSION: 1. Acute on chronic appearing  mild superior endplate compression of L3 with fracture lucencies noted anteriorly and right lateral. 2. Degenerative disc disease L1-2 and L3-4 with L4-5 and L5-S1 facet arthropathy. 3. Hepatic and splenic hypodensities too small to further characterize but statistically consistent with cysts or hemangiomata. 4. Mild cardiomegaly. Electronically Signed   By: Tollie Eth M.D.   On: 06/02/2017 01:10    Time Spent in minutes  30   Pearson Grippe M.D on 06/03/2017 at 7:51 AM  Between 7am to 7pm - Pager - [phone]  After 7pm go to [email or web address] - password [facility]  [facility] -  Office  [phone]

## 2017-06-04 ENCOUNTER — Inpatient Hospital Stay: Payer: Medicare HMO | Admitting: Family Medicine

## 2017-06-04 LAB — COMPREHENSIVE METABOLIC PANEL
ALK PHOS: 69 U/L (ref 38–126)
ALT: 14 U/L (ref 14–54)
ANION GAP: 11 (ref 5–15)
AST: 17 U/L (ref 15–41)
Albumin: 3 g/dL — ABNORMAL LOW (ref 3.5–5.0)
BUN: 34 mg/dL — ABNORMAL HIGH (ref 6–20)
CALCIUM: 8.8 mg/dL — AB (ref 8.9–10.3)
CO2: 29 mmol/L (ref 22–32)
CREATININE: 0.86 mg/dL (ref 0.44–1.00)
Chloride: 99 mmol/L — ABNORMAL LOW (ref 101–111)
Glucose, Bld: 124 mg/dL — ABNORMAL HIGH (ref 65–99)
Potassium: 2.7 mmol/L — CL (ref 3.5–5.1)
Sodium: 139 mmol/L (ref 135–145)
TOTAL PROTEIN: 6.5 g/dL (ref 6.5–8.1)
Total Bilirubin: 0.8 mg/dL (ref 0.3–1.2)

## 2017-06-04 LAB — APTT
APTT: 71 s — AB (ref 24–36)
APTT: 78 s — AB (ref 24–36)

## 2017-06-04 LAB — CBC
HEMATOCRIT: 30.7 % — AB (ref 36.0–46.0)
HEMOGLOBIN: 9.7 g/dL — AB (ref 12.0–15.0)
MCH: 24.7 pg — AB (ref 26.0–34.0)
MCHC: 31.6 g/dL (ref 30.0–36.0)
MCV: 78.3 fL (ref 78.0–100.0)
Platelets: 260 10*3/uL (ref 150–400)
RBC: 3.92 MIL/uL (ref 3.87–5.11)
RDW: 19.1 % — AB (ref 11.5–15.5)
WBC: 10.9 10*3/uL — ABNORMAL HIGH (ref 4.0–10.5)

## 2017-06-04 LAB — HEPARIN LEVEL (UNFRACTIONATED): HEPARIN UNFRACTIONATED: 0.73 [IU]/mL — AB (ref 0.30–0.70)

## 2017-06-04 MED ORDER — POTASSIUM CHLORIDE 20 MEQ/15ML (10%) PO SOLN
40.0000 meq | Freq: Once | ORAL | Status: AC
  Administered 2017-06-04: 40 meq via ORAL
  Filled 2017-06-04: qty 30

## 2017-06-04 MED ORDER — POTASSIUM CHLORIDE CRYS ER 20 MEQ PO TBCR
40.0000 meq | EXTENDED_RELEASE_TABLET | Freq: Every day | ORAL | Status: DC
  Administered 2017-06-05 – 2017-06-07 (×3): 40 meq via ORAL
  Filled 2017-06-04 (×4): qty 2

## 2017-06-04 MED ORDER — POTASSIUM CHLORIDE CRYS ER 20 MEQ PO TBCR: 40.0000 meq | EXTENDED_RELEASE_TABLET | Freq: Once | ORAL | Status: DC

## 2017-06-04 MED ORDER — POTASSIUM CHLORIDE CRYS ER 20 MEQ PO TBCR: 40.0000 meq | EXTENDED_RELEASE_TABLET | Freq: Four times a day (QID) | ORAL | Status: DC

## 2017-06-04 MED ORDER — FUROSEMIDE 20 MG PO TABS
10.0000 mg | ORAL_TABLET | Freq: Every day | ORAL | Status: DC
  Administered 2017-06-05 – 2017-06-07 (×3): 10 mg via ORAL
  Filled 2017-06-04 (×3): qty 1

## 2017-06-04 NOTE — Progress Notes (Signed)
Physical Therapy Treatment Patient Details Name: GEARLDEAN [name] MRN: [id number] DOB: 11-19-1935 Today's Date: 06/04/2017    History of Present Illness 81 y.o. female with medical history significant of paroxysmal Afibon xarelto, CAD, DM,and chronic diastolic CHF, hypertension, GERD, spinal stenosis admitted with B hip/knee pain x 3 weeks, 2 falls just PTA, L3 compression fx. (more details: Charlotte [name] is a 81 y.o. female presenting to ED after just being released from ED and unable to even move from stretcher to her bed at home, so she was brought back. She recently was seen at Urgent Care for worsening back pain over last 3 weeks, then fell on 8/23 and 8/24 with even greater severe pain in her mid back. She had a recent  admisson to the hospital from 05/21/17 to 05/23/17 for a GI bleed. Hx of chronic back pain and bilateral knee replacement about 10 years ago.  She lives alone and typically walks with a cane at home but since last few weeks she has been using her walker. )    PT Comments    Progressing slowly with mobility. Donned TLSO for OOB activity. Pain rated 7/10 during session. Pt is still awaiting approval for KP procedure. Will continue to follow and progress activity as able.    Follow Up Recommendations  SNF     Equipment Recommendations  Rolling walker with 5" wheels    Recommendations for Other Services       Precautions / Restrictions Precautions Precautions: Fall;Back Precaution Comments: logroll for comfort/ease of transitional movements Required Braces or Orthoses: Spinal Brace Spinal Brace: Thoracolumbosacral orthotic;Applied in sitting position Restrictions Weight Bearing Restrictions: No    Mobility  Bed Mobility Overal bed mobility: Needs Assistance Bed Mobility: Rolling Rolling: Min assist Sidelying to sit: Min assist;+2 for physical assistance;+2 for safety/equipment       General bed mobility comments: encouraged use of bed rail and log rolling.  Increased time. Cues for technique.   Transfers Overall transfer level: Needs assistance Equipment used: Rolling walker (2 wheeled) Transfers: Sit to/from [employer] Sit to Stand: Min assist;+2 physical assistance;+2 safety/equipment;From elevated surface Stand pivot transfers: Min assist;+2 physical assistance;+2 safety/equipment       General transfer comment: Assist to rise, stabilize, control descent. VCS safety, hand placement. Stand pivot, bed to recliner with RW.   Ambulation/Gait Ambulation/Gait assistance: Min assist;+2 physical assistance;+2 safety/equipment Ambulation Distance (Feet): 3 Feet Assistive device: Rolling walker (2 wheeled) Gait Pattern/deviations: Step-to pattern     General Gait Details: Cues for safety, posture. Assist to stabilize pt and maneuver with RW.    Stairs            Wheelchair Mobility    Modified Rankin (Stroke Patients Only)       Balance                                            Cognition Arousal/Alertness: Awake/alert Behavior During Therapy: WFL for tasks assessed/performed Overall Cognitive Status: Within Functional Limits for tasks assessed                                        Exercises      General Comments        Pertinent Vitals/Pain Pain Assessment: 0-10 Pain Score: 7  Pain  Location: back with activity Pain Descriptors / Indicators: Sore;Discomfort;Guarding Pain Intervention(s): Limited activity within patient's tolerance;Repositioned    Home Living                      Prior Function            PT Goals (current goals can now be found in the care plan section) Progress towards PT goals: Progressing toward goals    Frequency    Min 3X/week      PT Plan Current plan remains appropriate    Co-evaluation              AM-PAC PT "6 Clicks" Daily Activity  Outcome Measure  Difficulty turning over in bed (including  adjusting bedclothes, sheets and blankets)?: Unable Difficulty moving from lying on back to sitting on the side of the bed? : Unable Difficulty sitting down on and standing up from a chair with arms (e.g., wheelchair, bedside commode, etc,.)?: Unable Help needed moving to and from a bed to chair (including a wheelchair)?: A Lot Help needed walking in hospital room?: A Lot Help needed climbing 3-5 steps with a railing? : Total 6 Click Score: 8    End of Session   Activity Tolerance: Patient limited by pain Patient left: in chair;with call bell/phone within reach;with family/visitor present   PT Visit Diagnosis: Muscle weakness (generalized) (M62.81);Difficulty in walking, not elsewhere classified (R26.2);Pain Pain - part of body:  (back)     Time: 3837-7939 PT Time Calculation (min) (ACUTE ONLY): 15 min  Charges:  $Gait Training: 8-22 mins                    G Codes:          Rebeca Alert, MPT Pager: [phone]

## 2017-06-04 NOTE — Telephone Encounter (Signed)
Forms completed, copied, faxed, confirmation received  Scanned.Charlotte [name] [place]

## 2017-06-04 NOTE — Consult Note (Signed)
   [facility] CM Inpatient Consult   06/04/2017  Charlotte [name] Feb 08, 1936 [id number]    Patient screened for potential [facility] Care Management services.   Chart reviewed. Noted current discharge plan is for SNF.  There are no identifiable [facility] Care Management needs at this time.   If patient's post hospital needs change, please place a [facility] Care Management consult. For questions please contact:  Raiford Noble, MSN-Ed, RN,BSN [facility] Liaison [phone], MSN-Ed, RN,BSN [facility] Care Management [facility] Liaison [phone]

## 2017-06-04 NOTE — Progress Notes (Signed)
CRITICAL VALUE ALERT  Critical Value: potassium 2.7  Date & Time Notied:  8/28  0530  Provider Notified: Clearence Ped, NP   Orders Received/Actions taken: medication orders placed by NP and implemented by this RN.

## 2017-06-04 NOTE — Clinical Social Work Placement (Signed)
Patient received and accepted bed offer at [facility]. Staff member Wille Celeste started insurance authorization 06/04/2017.  CLINICAL SOCIAL WORK PLACEMENT  NOTE  Date:  06/04/2017  Patient Details  Name: Charlotte [name] MRN: [id number] Date of Birth: [birth year]  Clinical Social Work is seeking post-discharge placement for this patient at the Skilled  Nursing Facility level of care (*CSW will initial, date and re-position this form in  chart as items are completed):  Yes   Patient/family provided with [place] Clinical Social Work Department's list of facilities offering this level of care within the geographic area requested by the patient (or if unable, by the patient's family).  Yes   Patient/family informed of their freedom to choose among providers that offer the needed level of care, that participate in Medicare, Medicaid or managed care program needed by the patient, have an available bed and are willing to accept the patient.  Yes   Patient/family informed of [facility]'s ownership interest in [facility] and [facility], as well as of the fact that they are under no obligation to receive care at these facilities.  PASRR submitted to EDS on       PASRR number received on       Existing PASRR number confirmed on 06/03/17     FL2 transmitted to all facilities in geographic area requested by pt/family on 06/03/17     FL2 transmitted to all facilities within larger geographic area on       Patient informed that his/her managed care company has contracts with or will negotiate with certain facilities, including the following:        Yes   Patient/family informed of bed offers received.  Patient chooses bed at [facility]     Physician recommends and patient chooses bed at      Patient to be transferred to   on  .  Patient to be transferred to facility by       Patient family notified on   of transfer.  Name of family member notified:        PHYSICIAN       Additional Comment:    _______________________________________________ [name], LCSW 06/04/2017, 4:00 PM

## 2017-06-04 NOTE — Progress Notes (Signed)
ANTICOAGULATION CONSULT NOTE - Follow Up Consult  Pharmacy Consult for Heparin (PTA Xarelto) Indication: atrial fibrillation  Allergies  Allergen Reactions  . Amlodipine Besylate-Valsartan Other (See Comments)    HEADACHE     Patient Measurements: Height: 5' 2.5" (158.8 cm) Weight: 241 lb 6.5 oz (109.5 kg) IBW/kg (Calculated) : 51.25 Heparin Dosing Weight:   Vital Signs: Temp: 97.9 F (36.6 C) (08/28 0511) Temp Source: Oral (08/28 0511) BP: 107/45 (08/28 0511) Pulse Rate: 61 (08/28 0511)  Labs:  Recent Labs  06/02/17 0517  06/03/17 0144 06/03/17 0919 06/03/17 1344 06/03/17 2339 06/04/17 0425 06/04/17 0441  HGB 9.8*  --  9.0*  --   --   --  9.7*  --   HCT 30.8*  --  28.3*  --   --   --  30.7*  --   PLT 262  --  245  --   --   --  260  --   APTT  --   < > 144*  --  60* 78*  --  71*  HEPARINUNFRC  --   --  >2.20*  --   --   --  0.73*  --   CREATININE 0.90  --   --  1.01*  --   --  0.86  --   < > = values in this interval not displayed.  Estimated Creatinine Clearance: 62.5 mL/min (by C-G formula based on SCr of 0.86 mg/dL).   Medications:  Scheduled:  . carvedilol  6.25 mg Oral BID  . furosemide  20 mg Oral BID  . gabapentin  300 mg Oral TID  . pantoprazole  40 mg Oral BID AC  . psyllium  1 packet Oral Daily  . senna  1 tablet Oral BID  . sodium chloride flush  3 mL Intravenous Q12H  . verapamil  240 mg Oral QHS   Infusions:  . sodium chloride    . ampicillin-sulbactam (UNASYN) IV Stopped (06/04/17 0220)  . heparin 1,100 Units/hr (06/03/17 1812)    Assessment: Patient with high heparin level but PTT at goal again.  No heparin issues noted. PTT ordered with Heparin level until both correlate due to possible drug-lab interaction between oral anticoagulant (rivaroxaban, edoxaban, or apixaban) and anti-Xa level (aka heparin level)  Goal of Therapy:  Heparin level 0.3-0.7 units/ml aPTT 66-102 seconds Monitor platelets by anticoagulation protocol: Yes    Plan:  Continue heparin drip at current rate Recheck levels (PTT, heparin) with 8/29 AM labs  Darlina Guys, Jacquenette Shone [place] 06/04/2017,5:38 AM

## 2017-06-04 NOTE — Progress Notes (Signed)
PROGRESS NOTE    Charlotte [name]  ZOX:[id number] DOB: July 22, 1936 DOA: 06/01/2017 PCP: Agapito Games, MD   Brief Narrative: Charlotte [name] is a 81 y.o. female with a history of atrial fibrillation on Xarelto, CAD, diabetes mellitus, chronic diastolic heart failure, essential hypertension, GERD. Patient presented with back pain and falls and was found to have an L3 vertebral compression fracture. Patient has been managed on analgesics. PT evaluation and interventional radiology evaluation pending.   Assessment & Plan:   Active Problems:   HYPERTENSION, BENIGN SYSTEMIC   Long term current use of anticoagulant therapy   Diabetes mellitus type 2 in obese [facility])   Atrial fibrillation (HCC)   Symptomatic anemia   Low back pain   Intractable low back pain   Falls   Intractable back pain   Compression fracture of L3 lumbar vertebra (HCC)   Low back pain Secondary to compression fracture of L3. -Norco -TLSO brace -IR management pending insurance authorization -PT: SNF -Continue Robaxin when necessary  Chronic atrial fibrillation CHA2DS2-VASc Score is 5. On Xarelto as an outpatient. -continue heparin drip while off of Xarelto  Enterococcus UTI -Continue Unasyn   Peripheral neuropathy -Continue gabapentin  Frequent falls Secondary to pain in addition to neuropathy. Blood pressure could be contributing. -PT: SNF  Diabetes mellitus type 2 with peripheral neuropathy -Continue sliding-scale insulin  Essential hypertension -Continue carvedilol -discontinued losartan secondary to soft blood pressures  Anemia Stable. Microcytic. With chronic GI bleeding.  Hypokalemia Lasix likely playing a role -Potassium supplementation daily -Repeat BMP in the morning -decrease lasix dose  History of GI bleed -Continue PPI  Grade 1 diastolic dysfunction -continue Lasix at reduced dose   DVT prophylaxis: Heparin drip Code Status: DO NOT RESUSCITATE Family Communication:  Daughter at bedside Disposition Plan: Discharge to SNF pending IR eval/management   Consultants:   Interventional radiology  Procedures:   Heparin drip (8/26>>  Antimicrobials:  Unasyn (8/25>>    Subjective: Back pain managed with medication, however, significantly limited secondary to pain on ambulation.  Objective: Vitals:   06/03/17 0357 06/03/17 2046 06/03/17 2224 06/04/17 0511  BP: (!) 117/47 (!) 172/72 (!) 95/45 (!) 107/45  Pulse: 63 92  61  Resp: 18 18  18   Temp: 97.6 F (36.4 C) 100.1 F (37.8 C) 98.5 F (36.9 C) 97.9 F (36.6 C)  TempSrc: Oral Oral Oral Oral  SpO2: 93% 92%  94%  Weight:      Height:        Intake/Output Summary (Last 24 hours) at 06/04/17 1323 Last data filed at 06/04/17 0824  Gross per 24 hour  Intake           584.64 ml  Output              900 ml  Net          -315.36 ml   Filed Weights   06/02/17 0050  Weight: 109.5 kg (241 lb 6.5 oz)    Examination:  General exam: Appears calm and comfortable Respiratory system: Clear to auscultation. Respiratory effort normal. Cardiovascular system: Regular rate and rhythm. Normal S1 and S2. No heart murmurs present. No extra heart sounds Gastrointestinal system: Abdomen is nondistended, soft and nontender. Normal bowel sounds heard. Central nervous system: Alert and oriented. No focal neurological deficits. Extremities: No edema. No calf tenderness Skin: No cyanosis. Psychiatry: Judgement and insight appear normal. Mood & affect appropriate.     Data Reviewed: I have personally reviewed following labs and imaging studies  CBC:  Recent Labs Lab 05/29/17 1543 06/01/17 0735 06/02/17 0517 06/03/17 0144 06/04/17 0425  WBC 9.5 7.6 8.0 7.6 10.9*  NEUTROABS  --  5.1  --   --   --   HGB 10.5* 9.9* 9.8* 9.0* 9.7*  HCT 34.0* 31.3* 30.8* 28.3* 30.7*  MCV 79.8* 77.5* 77.2* 78.8 78.3  PLT 304 244 262 245 260   Basic Metabolic Panel:  Recent Labs Lab 06/01/17 0735 06/01/17 2351  06/02/17 0517 06/03/17 0919 06/04/17 0425  NA 133* 135 137 134* 139  K 2.6* 3.2* 3.0* 3.0* 2.7*  CL 93* 95* 96* 97* 99*  CO2 32 29 32 28 29  GLUCOSE 128* 129* 121* 111* 124*  BUN 39* 30* 36* 42* 34*  CREATININE 0.82 0.85 0.90 1.01* 0.86  CALCIUM 9.2 9.4 9.2 8.7* 8.8*  MG 1.8  --  1.7  --   --   PHOS  --   --  4.2  --   --    GFR: Estimated Creatinine Clearance: 62.5 mL/min (by C-G formula based on SCr of 0.86 mg/dL). Liver Function Tests:  Recent Labs Lab 06/01/17 2351 06/02/17 0517 06/04/17 0425  AST 25 18 17   ALT 16 15 14   ALKPHOS 67 63 69  BILITOT 0.8 0.7 0.8  PROT 7.2 6.8 6.5  ALBUMIN 3.4* 3.2* 3.0*   No results for input(s): LIPASE, AMYLASE in the last 168 hours. No results for input(s): AMMONIA in the last 168 hours. Coagulation Profile: No results for input(s): INR, PROTIME in the last 168 hours. Cardiac Enzymes: No results for input(s): CKTOTAL, CKMB, CKMBINDEX, TROPONINI in the last 168 hours. BNP (last 3 results)  Recent Labs  05/21/17 1510  PROBNP 332   HbA1C: No results for input(s): HGBA1C in the last 72 hours. CBG: No results for input(s): GLUCAP in the last 168 hours. Lipid Profile: No results for input(s): CHOL, HDL, LDLCALC, TRIG, CHOLHDL, LDLDIRECT in the last 72 hours. Thyroid Function Tests:  Recent Labs  06/02/17 0517  TSH 2.507   Anemia Panel: No results for input(s): VITAMINB12, FOLATE, FERRITIN, TIBC, IRON, RETICCTPCT in the last 72 hours. Sepsis Labs: No results for input(s): PROCALCITON, LATICACIDVEN in the last 168 hours.  Recent Results (from the past 240 hour(s))  Urine culture     Status: None   Collection Time: 05/28/17  6:33 PM  Result Value Ref Range Status   Culture ENTEROCOCCUS SPECIES  Final   Colony Count Greater than 100,000 CFU/mL  Final   Organism ID, Bacteria ENTEROCOCCUS SPECIES  Final      Susceptibility   Enterococcus species -  (no method available)    AMPICILLIN <=2 Sensitive     LEVOFLOXACIN 0.5  Sensitive     NITROFURANTOIN <=16 Sensitive     VANCOMYCIN 1 Sensitive     TETRACYCLINE >=16 Resistant   Urine culture     Status: None   Collection Time: 06/01/17  6:48 AM  Result Value Ref Range Status   Specimen Description URINE, RANDOM  Final   Special Requests NONE  Final   Culture   Final    NO GROWTH Performed at [facility], [address] [place], [place] [address]    Report Status 06/02/2017 FINAL  Final         Radiology Studies: Mr Lumbar Spine Wo Contrast  Result Date: 06/03/2017 CLINICAL DATA:  L3 compression fracture. Mid back pain. Unable to ambulate. EXAM: MRI LUMBAR SPINE WITHOUT CONTRAST TECHNIQUE: Multiplanar, multisequence MR imaging of  the lumbar spine was performed. No intravenous contrast was administered. COMPARISON:  CT of the abdomen and pelvis 06/02/2017. Lumbar spine radiographs 06/01/2017 and 05/29/2017. FINDINGS: Segmentation: 5 non rib-bearing lumbar type vertebral bodies are present. Alignment: Slight anterolisthesis at L4-5 is stable. AP alignment is otherwise anatomic. Levoconvex curvature of the lumbar spine is centered at L4. Vertebrae: Acute/subacute superior endplate fracture is confirmed at L3. There is 30% loss of height anteriorly. No significant retropulsed bone is present. Chronic endplate marrow changes are present with fatty replacement on the left at L1-2. Mild chronic endplate changes are noted on the right at L3-4. There is diffuse fatty infiltration of the marrow throughout the sacrum. Conus medullaris: Extends to the L1-2 level and appears normal. Paraspinal and other soft tissues: Limited imaging of the abdomen is unremarkable. Disc levels: T12-L1: Negative. L1-2: A broad-based disc protrusion is present. Mild facet hypertrophy is noted bilaterally. This results in mild subarticular and foraminal narrowing bilaterally. L2-3: A broad-based disc protrusion is present. Moderate facet hypertrophy is worse on the right. Moderate right and  mild left subarticular and foraminal narrowing is present bilaterally. L3-4: A broad-based disc protrusion is present. Moderate facet hypertrophy is noted bilaterally. Mild subarticular narrowing is noted bilaterally. Facet hypertrophy contributes to moderate right and mild left foraminal stenosis. L4-5: A broad-based disc protrusion is present. Moderate facet hypertrophy is noted bilaterally. Moderate subarticular and foraminal narrowing is present bilaterally, worse on the left. Facet spurring contributes. L5-S1: Mild facet hypertrophy is worse on the left. Spurring results in mild left foraminal narrowing. The central canal is patent. IMPRESSION: 1. Acute/subacute superior endplate compression fracture at L3 with 30% loss of height anteriorly but no retropulsed bone. 2. Levoconvex scoliosis at L4-5. 3. Mild subarticular and foraminal narrowing bilaterally at L1-2. 4. Moderate right and mild left subarticular and foraminal stenosis at L2-3. 5. Mild subarticular narrowing bilaterally at L3-4 with moderate right and mild left foraminal stenosis. 6. Moderate subarticular and foraminal stenosis bilaterally at L4-5 with facet spurring contributing to the foraminal disease. 7. Asymmetric left-sided facet hypertrophy and spurring contributes to mild left foraminal stenosis at L5-S1. Electronically Signed   By: Marin Roberts M.D.   On: 06/03/2017 08:40        Scheduled Meds: . carvedilol  6.25 mg Oral BID  . furosemide  10 mg Oral Daily  . gabapentin  300 mg Oral TID  . pantoprazole  40 mg Oral BID AC  . [START ON 06/05/2017] potassium chloride  40 mEq Oral Daily  . psyllium  1 packet Oral Daily  . senna  1 tablet Oral BID  . sodium chloride flush  3 mL Intravenous Q12H  . verapamil  240 mg Oral QHS   Continuous Infusions: . sodium chloride    . ampicillin-sulbactam (UNASYN) IV Stopped (06/04/17 0854)  . heparin 1,100 Units/hr (06/03/17 1812)     LOS: 2 days     Jacquelin Hawking, MD [facility] 06/04/2017, 1:23 PM Pager: [phone]  If 7PM-7AM, please contact night-coverage [email or web address] Password [email or web address] 06/04/2017, 1:23 PM

## 2017-06-04 NOTE — Telephone Encounter (Signed)
Spoke with Charlotte [name] this morning: -She's been missing full days of work since Friday August 24th  -Applied for Intermittent Leave for help at home and hospitalization This is all the information she has now and wants  you to call her at [phone] if you have more questions.

## 2017-06-05 DIAGNOSIS — M545 Low back pain, unspecified: Secondary | ICD-10-CM | POA: Diagnosis present

## 2017-06-05 LAB — CBC
HCT: 28.3 % — ABNORMAL LOW (ref 36.0–46.0)
HEMOGLOBIN: 9 g/dL — AB (ref 12.0–15.0)
MCH: 25 pg — AB (ref 26.0–34.0)
MCHC: 31.8 g/dL (ref 30.0–36.0)
MCV: 78.6 fL (ref 78.0–100.0)
PLATELETS: 249 10*3/uL (ref 150–400)
RBC: 3.6 MIL/uL — AB (ref 3.87–5.11)
RDW: 19.2 % — ABNORMAL HIGH (ref 11.5–15.5)
WBC: 7.3 10*3/uL (ref 4.0–10.5)

## 2017-06-05 LAB — BASIC METABOLIC PANEL
ANION GAP: 8 (ref 5–15)
BUN: 30 mg/dL — ABNORMAL HIGH (ref 6–20)
CHLORIDE: 101 mmol/L (ref 101–111)
CO2: 27 mmol/L (ref 22–32)
Calcium: 8.6 mg/dL — ABNORMAL LOW (ref 8.9–10.3)
Creatinine, Ser: 0.83 mg/dL (ref 0.44–1.00)
GFR calc non Af Amer: >60 mL/min (ref >60)
GLUCOSE: 119 mg/dL — AB (ref 65–99)
Potassium: 3.1 mmol/L — ABNORMAL LOW (ref 3.5–5.1)
Sodium: 136 mmol/L (ref 135–145)

## 2017-06-05 LAB — GLUCOSE, CAPILLARY
GLUCOSE-CAPILLARY: 123 mg/dL — AB (ref 65–99)
Glucose-Capillary: 115 mg/dL — ABNORMAL HIGH (ref 65–99)

## 2017-06-05 LAB — APTT: aPTT: 104 seconds — ABNORMAL HIGH (ref 24–36)

## 2017-06-05 LAB — HEPARIN LEVEL (UNFRACTIONATED): Heparin Unfractionated: 0.38 IU/mL (ref 0.30–0.70)

## 2017-06-05 MED ORDER — CEFAZOLIN SODIUM-DEXTROSE 2-4 GM/100ML-% IV SOLN
2.0000 g | Freq: Once | INTRAVENOUS | Status: DC
  Filled 2017-06-05: qty 100

## 2017-06-05 MED ORDER — INSULIN ASPART 100 UNIT/ML ~~LOC~~ SOLN
0.0000 [IU] | Freq: Three times a day (TID) | SUBCUTANEOUS | Status: DC
  Administered 2017-06-06: 2 [IU] via SUBCUTANEOUS

## 2017-06-05 NOTE — Progress Notes (Signed)
ANTICOAGULATION CONSULT NOTE - f/u Consult  Pharmacy Consult for Heparin (PTA xarelto) \Indication: atrial fibrillation  Allergies  Allergen Reactions  . Amlodipine Besylate-Valsartan Other (See Comments)    HEADACHE     Patient Measurements: Height: 5' 2.5" (158.8 cm) Weight: 241 lb 6.5 oz (109.5 kg) IBW/kg (Calculated) : 51.25 Heparin Dosing Weight: 78 kg  Vital Signs: Temp: 97.9 F (36.6 C) (08/29 0525) Temp Source: Oral (08/29 0525) BP: 128/53 (08/29 0525) Pulse Rate: 58 (08/29 0525)  Labs:  Recent Labs  06/03/17 0144 06/03/17 0919  06/03/17 2339 06/04/17 0425 06/04/17 0441 06/05/17 0435  HGB 9.0*  --   --   --  9.7*  --  9.0*  HCT 28.3*  --   --   --  30.7*  --  28.3*  PLT 245  --   --   --  260  --  249  APTT 144*  --   < > 78*  --  71* 104*  HEPARINUNFRC >2.20*  --   --   --  0.73*  --  0.38  CREATININE  --  1.01*  --   --  0.86  --  0.83  < > = values in this interval not displayed.  Estimated Creatinine Clearance: 64.7 mL/min (by C-G formula based on SCr of 0.83 mg/dL).   Assessment: 81yo F admitted with back pain secondary to compression fracture of L3. Patient on chronic Xarelto for paroxysmal A.fib, last dose taken 8/25 at 17:15. Pharmacy is asked to start heparin drip while holding oral anticoagulation for possible vertebroplasty or kyphoplasty.  Today, 06/05/17:     aPTT = 104 seconds, heparin level= 0.38 on heparin infusion at 1100 units/hr  CBC: Hgb 9, low but stable. Pltc WNL.   No bleeding or line issues reported per nursing.   Goal of Therapy:  aPTT 66-102 seconds Heparin level 0.3-0.7 units/ml Monitor platelets by anticoagulation protocol: Yes   Plan:   Continue  heparin infusion at 1100 units/hr  Effects of xarelto have diminished and will use only heparin levels moving forward  Daily CBC and heparin level   Monitor closely for s/sx of bleeding  F/u timing of procedure (if indicated) and when to stop heparin prior.     Arley Phenix RPh 06/05/2017, 8:14 AM Pager [phone]

## 2017-06-05 NOTE — Progress Notes (Addendum)
Patient ID: Charlotte [name], female   DOB: Jun 23, 1936, 81 y.o.   MRN: [id number]  PROGRESS NOTE    Charlotte [name]  NUU:[id number] DOB: November 06, 1935 DOA: 06/01/2017  PCP: Agapito Games, MD   Brief Narrative:  81 year old female with atrial fibrillation (on xarelto), CAD, diabetes, chronic diastolic CHF, hypertension who presented to ED with ongoing back pain and falls. She was found to have L3 vertebral compression fracture. IR consulted for vertebroplasty/kyphoplasty.   Assessment & Plan:   Active Problems:   Acute L3 compression fracture / Persistent low back pain without sciatica / Frequent falls  - Pt presented to ED with back pain - Pt found to have an acute/subacute superior endplate compression fracture at L3 with 30% loss of height anteriorly - Awaiting insurance approval and potential vertebroplasty or kyphoplasty - Manage pain conservatively for now - Per pT - SNF recommended - Appreciate SW assistance with discharge planning    Hypokalemia - Supplemented - Follow up BMP in am    Leukocytosis / Enterococcus UTI - Enterococcus species on urine cx 8/21 but no growth on this admission urine cx - Will continue abx for 2 more days (total of 7 day treatment) - Continue Unasyn     CKD stage 3 - Baseline Cr 1.19 in 02/2017 - Cr WNL at this time    Anemia of chronic disease - Due to CKD - Hgb 9, stable     Paroxysmal atrial fibrillation  - CHA2DS2-VASc Score is 5 - On xarelto outpt - Now on hep drip in case she goes for vertebroplasty    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  Objective: Vitals:   06/04/17 0511 06/04/17 1351 06/04/17 2104 06/05/17 0525  BP: (!) 107/45 (!) 112/56 (!) 134/43 (!) 128/53  Pulse: 61 69 71 (!) 58  Resp: 18 19 18 18   Temp: 97.9 F (36.6 C) 97.6 F (36.4 C) 98.5 F (36.9 C) 97.9 F (36.6 C)  TempSrc: Oral Oral Oral Oral  SpO2: 94% 96% 95% 94%  Weight:      Height:        Intake/Output Summary (Last 24 hours) at 06/05/17 1215 Last data filed at 06/05/17 1045  Gross per 24 hour  Intake             1188 ml  Output             1800 ml  Net             -612 ml   Filed Weights   06/02/17 0050  Weight: 109.5 kg (241 lb 6.5 oz)    Examination:  General exam: Appears calm and comfortable  Respiratory system: Clear to auscultation. Respiratory effort normal. Cardiovascular system: S1 & S2 heard, RRR.  Gastrointestinal system: Abdomen is nondistended, soft and nontender. No organomegaly or masses felt. Normal bowel sounds heard. Central nervous system: Alert and oriented. No focal neurological deficits. Extremities:  Symmetric 5 x 5 power. Skin: No rashes, lesions or ulcers Psychiatry: Judgement and insight appear normal. Mood & affect appropriate.   Data Reviewed: I have personally reviewed following labs and imaging studies  CBC:  Recent Labs Lab 06/01/17 0735 06/02/17 0517 06/03/17 0144 06/04/17 0425 06/05/17 0435  WBC 7.6 8.0 7.6 10.9* 7.3  NEUTROABS 5.1  --   --   --   --   HGB 9.9* 9.8* 9.0* 9.7* 9.0*  HCT 31.3* 30.8* 28.3* 30.7* 28.3*  MCV 77.5* 77.2* 78.8 78.3 78.6  PLT 244 262 245 260 249   Basic Metabolic Panel:  Recent Labs Lab 06/01/17 0735 06/01/17 2351 06/02/17 0517 06/03/17 0919 06/04/17 0425 06/05/17 0435  NA 133* 135 137 134* 139 136  K 2.6* 3.2* 3.0* 3.0* 2.7* 3.1*  CL 93* 95* 96* 97* 99* 101  CO2 32 29 32 28 29 27   GLUCOSE 128* 129* 121* 111*  124* 119*  BUN 39* 30* 36* 42* 34* 30*  CREATININE 0.82 0.85 0.90 1.01* 0.86 0.83  CALCIUM 9.2 9.4 9.2 8.7* 8.8* 8.6*  MG 1.8  --  1.7  --   --   --   PHOS  --   --  4.2  --   --   --    GFR: Estimated Creatinine Clearance: 64.7 mL/min (by C-G formula based on SCr of 0.83 mg/dL). Liver Function Tests:  Recent Labs Lab 06/01/17 2351 06/02/17 0517 06/04/17 0425  AST 25 18 17   ALT 16 15 14   ALKPHOS 67 63 69  BILITOT 0.8 0.7 0.8  PROT 7.2 6.8 6.5  ALBUMIN 3.4* 3.2* 3.0*   No results for input(s): LIPASE, AMYLASE in the last 168 hours. No results for input(s): AMMONIA in the last 168 hours. Coagulation Profile: No results for input(s): INR, PROTIME in the last 168 hours. Cardiac Enzymes: No results for input(s): CKTOTAL, CKMB, CKMBINDEX, TROPONINI in the last 168 hours. BNP (last 3 results)  Recent Labs  05/21/17 1510  PROBNP 332   HbA1C: No results for input(s): HGBA1C in the last 72 hours. CBG: No results for input(s): GLUCAP in the last 168 hours. Lipid Profile: No results for input(s): CHOL, HDL, LDLCALC, TRIG, CHOLHDL, LDLDIRECT in the last 72 hours. Thyroid Function Tests: No results for input(s): TSH, T4TOTAL, FREET4, T3FREE, THYROIDAB in the last 72 hours. Anemia Panel: No results for input(s): VITAMINB12, FOLATE, FERRITIN, TIBC, IRON, RETICCTPCT in the last 72 hours. Urine analysis:    Component Value Date/Time   COLORURINE YELLOW 06/01/2017 0648   APPEARANCEUR CLEAR 06/01/2017 0648   LABSPEC <1.005 (L) 06/01/2017 0648   PHURINE 6.5 06/01/2017 0648   GLUCOSEU NEGATIVE 06/01/2017 0648   HGBUR NEGATIVE 06/01/2017 0648   BILIRUBINUR NEGATIVE 06/01/2017 0648   BILIRUBINUR small (A) 05/28/2017 1829   KETONESUR NEGATIVE 06/01/2017 0648   PROTEINUR NEGATIVE 06/01/2017 0648   UROBILINOGEN 0.2 05/28/2017 1829   UROBILINOGEN 0.2 03/23/2008 1050   NITRITE NEGATIVE 06/01/2017 0648   LEUKOCYTESUR NEGATIVE 06/01/2017 0648   Sepsis  Labs: @LABRCNTIP (procalcitonin:4,lacticidven:4)   Urine culture     Status: None   Collection Time: 05/28/17  6:33 PM  Result Value Ref Range Status   Organism ID, Bacteria ENTEROCOCCUS SPECIES  Final      Susceptibility  Urine culture     Status: None   Collection Time: 06/01/17  6:48 AM  Result Value Ref Range Status   Special Requests NONE  Final    NO GROWTH   Report Status 06/02/2017 FINAL  Final      Radiology Studies: Mr Lumbar Spine Wo Contrast Result Date: 06/03/2017 1. Acute/subacute superior endplate compression fracture at L3 with 30% loss of height anteriorly but no retropulsed bone. 2. Levoconvex scoliosis at L4-5. 3. Mild subarticular and foraminal narrowing bilaterally at L1-2. 4. Moderate right and mild left subarticular and foraminal stenosis at L2-3. 5. Mild subarticular narrowing bilaterally at L3-4 with moderate right and mild left foraminal stenosis. 6. Moderate subarticular and foraminal stenosis bilaterally at L4-5 with facet spurring contributing to the foraminal disease. 7. Asymmetric left-sided facet hypertrophy and spurring contributes to mild left foraminal stenosis at L5-S1.  Ct Abdomen Pelvis W Contrast Result Date: 06/02/2017 1. Acute on chronic appearing mild superior endplate compression of L3 with fracture lucencies noted anteriorly and right lateral. 2. Degenerative disc disease L1-2 and L3-4 with L4-5 and L5-S1 facet arthropathy. 3. Hepatic and splenic hypodensities too small to further characterize but statistically consistent with cysts or hemangiomata. 4. Mild cardiomegaly.   Scheduled Meds: . carvedilol  6.25 mg Oral BID  . furosemide  10 mg Oral Daily  . gabapentin  300 mg Oral TID  . pantoprazole  40 mg Oral BID AC  . potassium chloride  40 mEq Oral Daily  . psyllium  1 packet Oral Daily  . senna  1 tablet Oral BID  . verapamil  240 mg Oral QHS   Continuous Infusions: . sodium chloride    . ampicillin-sulbactam (UNASYN) IV Stopped  (06/05/17 0815)  . heparin 1,100 Units/hr (06/04/17 1603)     LOS: 3 days    Time spent: 25 minutes  Greater than 50% of the time spent on counseling and coordinating the care.   Manson Passey, MD [facility] Pager [phone]  If 7PM-7AM, please contact night-coverage [email or web address] Password [facility] 06/05/2017, 12:15 PM

## 2017-06-05 NOTE — Care Management Note (Signed)
Case Management Note  Patient Details  Name: Charlotte [name] MRN: [id number] Date of Birth: [birth year]  Subjective/Objective: 81 y/o f admitted w/low back pain. Hx: paroxsymal afib. Compression fx L3-IR awaiting insurance approval for kypho vs vertoplasty. UTI.PT-SNF-CSW already following.                   Action/Plan:d/c plan SNF.   Expected Discharge Date:                  Expected Discharge Plan:  Skilled Nursing Facility  In-House Referral:  Clinical Social Work  Discharge planning Services  CM Consult  Post Acute Care Choice:    Choice offered to:     DME Arranged:    DME Agency:     HH Arranged:    HH Agency:     Status of Service:  In process, will continue to follow  If discussed at Long Length of Stay Meetings, dates discussed:    Additional Comments:  Lanier Clam, RN 06/05/2017, 10:48 AM

## 2017-06-05 NOTE — Progress Notes (Signed)
Occupational Therapy Treatment Patient Details Name: Charlotte [name] MRN: [id number] DOB: 01-Jul-1936 Today's Date: 06/05/2017    History of present illness 81 y.o. female with medical history significant of paroxysmal Afibon xarelto, CAD, DM,and chronic diastolic CHF, hypertension, GERD, spinal stenosis admitted with B hip/knee pain x 3 weeks, 2 falls just PTA, L3 compression fx. (more details: Charlotte [name] is a 81 y.o. female presenting to ED after just being released from ED and unable to even move from stretcher to her bed at home, so she was brought back. She recently was seen at Urgent Care for worsening back pain over last 3 weeks, then fell on 8/23 and 8/24 with even greater severe pain in her mid back. She had a recent  admisson to the hospital from 05/21/17 to 05/23/17 for a GI bleed. Hx of chronic back pain and bilateral knee replacement about 10 years ago.  She lives alone and typically walks with a cane at home but since last few weeks she has been using her walker. )   OT comments  pts brace does not fit well. OT did communicate with nursing.    Follow Up Recommendations  SNF    Equipment Recommendations  None recommended by OT       Precautions / Restrictions Precautions Precautions: Fall;Back Spinal Brace: Thoracolumbosacral orthotic;Applied in sitting position (pt in chair with out brace.  Brace applied and removed .  Order states for ambulation only)       Mobility Bed Mobility               General bed mobility comments: pt in chair  Transfers Overall transfer level: Needs assistance Equipment used: Rolling walker (2 wheeled) Transfers: Sit to/from [employer] Sit to Stand: Mod assist Stand pivot transfers: Mod assist                ADL either performed or assessed with clinical judgement   ADL Overall ADL's : Needs assistance/impaired                                       General ADL Comments: pt was sitting in chair  upon OT arrival without brace.  OT donned Brace. In sitting brace was riding up and pushing into pts chin.  MD order for brace states with ambulation.  Brace removed.               Cognition Arousal/Alertness: Awake/alert Behavior During Therapy: WFL for tasks assessed/performed Overall Cognitive Status: Within Functional Limits for tasks assessed                                                     Pertinent Vitals/ Pain       Pain Score: 6  Pain Location: back with activity Pain Descriptors / Indicators: Sore;Discomfort;Guarding Pain Intervention(s): Limited activity within patient's tolerance;Monitored during session;Repositioned         Frequency  Min 2X/week        Progress Toward Goals  OT Goals(current goals can now be found in the care plan section)  Progress towards OT goals: Progressing toward goals     Plan Discharge plan remains appropriate       AM-PAC PT "6 Clicks" Daily Activity  Outcome Measure   Help from another person eating meals?: None Help from another person taking care of personal grooming?: A Little Help from another person toileting, which includes using toliet, bedpan, or urinal?: A Lot Help from another person bathing (including washing, rinsing, drying)?: A Lot Help from another person to put on and taking off regular upper body clothing?: A Little Help from another person to put on and taking off regular lower body clothing?: Total 6 Click Score: 15    End of Session Equipment Utilized During Treatment: Rolling walker  OT Visit Diagnosis: Pain;Muscle weakness (generalized) (M62.81);Unsteadiness on feet (R26.81) Pain - part of body:  (back)   Activity Tolerance Patient limited by pain   Patient Left with call bell/phone within reach;with family/visitor present;in chair;with chair alarm set   Nurse Communication Mobility status        Time: 6213-0865 OT Time Calculation (min): 27 min  Charges: OT  General Charges $OT Visit: 1 Visit OT Treatments $Self Care/Home Management : 23-37 mins  [place], [place] [phone]   Alba Cory 06/05/2017, 12:33 PM

## 2017-06-05 NOTE — Progress Notes (Signed)
Pharmacy Antibiotic Note  Charlotte [name] is a 81 y.o. female c/o back pain admitted on 06/01/2017 with UTI.  Pharmacy has been consulted for unasyn dosing.  Plan: Continue Unasyn 3 Gm IV q6h F/u scr/cultures as indicated  Height: 5' 2.5" (158.8 cm) Weight: 241 lb 6.5 oz (109.5 kg) IBW/kg (Calculated) : 51.25  Temp (24hrs), Avg:98 F (36.7 C), Min:97.6 F (36.4 C), Max:98.5 F (36.9 C)   Recent Labs Lab 06/01/17 0735 06/01/17 2351 06/02/17 0517 06/03/17 0144 06/03/17 0919 06/04/17 0425 06/05/17 0435  WBC 7.6  --  8.0 7.6  --  10.9* 7.3  CREATININE 0.82 0.85 0.90  --  1.01* 0.86 0.83    Estimated Creatinine Clearance: 64.7 mL/min (by C-G formula based on SCr of 0.83 mg/dL).    Allergies  Allergen Reactions  . Amlodipine Besylate-Valsartan Other (See Comments)    HEADACHE     Antimicrobials this admission: 8/26 unasyn >>    >>   Dose adjustments this admission:   Microbiology results:  BCx:   UCx:    Sputum:    MRSA PCR:   Thank you for allowing pharmacy to be a part of this patient's care.  Arley Phenix RPh 06/05/2017, 9:40 AM Pager [phone]

## 2017-06-05 NOTE — Care Management Important Message (Signed)
Important Message  Patient Details  Name: SERETTA WIEDRICH MRN: [id number] Date of Birth: 02-Nov-1935   Medicare Important Message Given:  Yes    Caren Macadam 06/05/2017, 11:00 AMImportant Message  Patient Details  Name: NYVEAH JUNGMANN MRN: [id number] Date of Birth: 1935-10-22   Medicare Important Message Given:  Yes    Caren Macadam 06/05/2017, 11:00 AM

## 2017-06-05 NOTE — Progress Notes (Signed)
Patient ID: Charlotte [name], female   DOB: [birth year], 81 y.o.   MRN: [id number] Patient's insurance company has approved vertebral augmentation procedure on patient. She will undergo L3 VP/KP on 8/30 at [facility] with Dr. Corliss Skains. He has reviewed imaging studies. Procedure tentatively scheduled for 10:00 AM. She will be transported to [facility] tomorrow morning and return to [facility] following the procedure.Pt is afebrile with latest urine culture negative for growth. IV heparin will be stopped 2 hours prior to procedure. Pt /nurse aware.

## 2017-06-06 ENCOUNTER — Ambulatory Visit (HOSPITAL_COMMUNITY)
Admit: 2017-06-06 | Discharge: 2017-06-06 | Disposition: A | Discharge status: Home / Self Care | Payer: Medicare HMO | Attending: Internal Medicine | Admitting: Internal Medicine

## 2017-06-06 DIAGNOSIS — M4856XA Collapsed vertebra, not elsewhere classified, lumbar region, initial encounter for fracture: Secondary | ICD-10-CM | POA: Diagnosis not present

## 2017-06-06 DIAGNOSIS — M545 Low back pain: Secondary | ICD-10-CM | POA: Diagnosis not present

## 2017-06-06 HISTORY — PX: IR KYPHO LUMBAR INC FX REDUCE BONE BX UNI/BIL CANNULATION INC/IMAGING: IMG5519

## 2017-06-06 LAB — BASIC METABOLIC PANEL
ANION GAP: 8 (ref 5–15)
BUN: 24 mg/dL — ABNORMAL HIGH (ref 6–20)
CALCIUM: 8.7 mg/dL — AB (ref 8.9–10.3)
CO2: 27 mmol/L (ref 22–32)
CREATININE: 0.8 mg/dL (ref 0.44–1.00)
Chloride: 103 mmol/L (ref 101–111)
Glucose, Bld: 98 mg/dL (ref 65–99)
Potassium: 3.4 mmol/L — ABNORMAL LOW (ref 3.5–5.1)
Sodium: 138 mmol/L (ref 135–145)

## 2017-06-06 LAB — GLUCOSE, CAPILLARY
GLUCOSE-CAPILLARY: 116 mg/dL — AB (ref 65–99)
GLUCOSE-CAPILLARY: 185 mg/dL — AB (ref 65–99)
Glucose-Capillary: 107 mg/dL — ABNORMAL HIGH (ref 65–99)
Glucose-Capillary: 121 mg/dL — ABNORMAL HIGH (ref 65–99)

## 2017-06-06 LAB — URINALYSIS, ROUTINE W REFLEX MICROSCOPIC
Bilirubin Urine: NEGATIVE
GLUCOSE, UA: NEGATIVE mg/dL
HGB URINE DIPSTICK: NEGATIVE
Ketones, ur: NEGATIVE mg/dL
LEUKOCYTES UA: NEGATIVE
Nitrite: NEGATIVE
PH: 7 (ref 5.0–8.0)
Protein, ur: NEGATIVE mg/dL
Specific Gravity, Urine: 1.024 (ref 1.005–1.030)

## 2017-06-06 LAB — HEPARIN LEVEL (UNFRACTIONATED): HEPARIN UNFRACTIONATED: 0.19 [IU]/mL — AB (ref 0.30–0.70)

## 2017-06-06 LAB — PROTIME-INR
INR: 1.01
Prothrombin Time: 13.2 seconds (ref 11.4–15.2)

## 2017-06-06 LAB — CBC
HCT: 28.8 % — ABNORMAL LOW (ref 36.0–46.0)
Hemoglobin: 9 g/dL — ABNORMAL LOW (ref 12.0–15.0)
MCH: 24.7 pg — ABNORMAL LOW (ref 26.0–34.0)
MCHC: 31.3 g/dL (ref 30.0–36.0)
MCV: 79.1 fL (ref 78.0–100.0)
PLATELETS: 285 10*3/uL (ref 150–400)
RBC: 3.64 MIL/uL — AB (ref 3.87–5.11)
RDW: 19.6 % — AB (ref 11.5–15.5)
WBC: 7.2 10*3/uL (ref 4.0–10.5)

## 2017-06-06 LAB — HEMOGLOBIN A1C
Hgb A1c MFr Bld: 5.4 % (ref 4.8–5.6)
Mean Plasma Glucose: 108.28 mg/dL

## 2017-06-06 MED ORDER — MIDAZOLAM HCL 2 MG/2ML IJ SOLN
INTRAMUSCULAR | Status: AC
  Filled 2017-06-06: qty 6

## 2017-06-06 MED ORDER — FENTANYL CITRATE (PF) 100 MCG/2ML IJ SOLN
INTRAMUSCULAR | Status: AC
  Filled 2017-06-06: qty 4

## 2017-06-06 MED ORDER — HEPARIN (PORCINE) IN NACL 100-0.45 UNIT/ML-% IJ SOLN
1400.0000 [IU]/h | INTRAMUSCULAR | Status: AC
  Administered 2017-06-06: 1250 [IU]/h via INTRAVENOUS
  Filled 2017-06-06: qty 250

## 2017-06-06 MED ORDER — BUPIVACAINE HCL (PF) 0.5 % IJ SOLN
INTRAMUSCULAR | Status: AC
  Filled 2017-06-06: qty 30

## 2017-06-06 MED ORDER — INSULIN ASPART 100 UNIT/ML ~~LOC~~ SOLN: 0.0000 [IU] | Freq: Every day | SUBCUTANEOUS | Status: DC

## 2017-06-06 MED ORDER — TOBRAMYCIN SULFATE 1.2 G IJ SOLR
INTRAMUSCULAR | Status: AC
  Administered 2017-06-06: 1.2 g
  Filled 2017-06-06: qty 1.2

## 2017-06-06 MED ORDER — HYDROMORPHONE HCL 1 MG/ML IJ SOLN
INTRAMUSCULAR | Status: AC | PRN
  Administered 2017-06-06: 1 mg via INTRAVENOUS

## 2017-06-06 MED ORDER — MIDAZOLAM HCL 2 MG/2ML IJ SOLN
INTRAMUSCULAR | Status: AC | PRN
  Administered 2017-06-06 (×2): 1 mg via INTRAVENOUS

## 2017-06-06 MED ORDER — HYDROMORPHONE HCL 1 MG/ML IJ SOLN
INTRAMUSCULAR | Status: AC
  Filled 2017-06-06: qty 1

## 2017-06-06 MED ORDER — FENTANYL CITRATE (PF) 100 MCG/2ML IJ SOLN
INTRAMUSCULAR | Status: AC | PRN
  Administered 2017-06-06 (×3): 25 ug via INTRAVENOUS

## 2017-06-06 MED ORDER — SODIUM CHLORIDE 0.9 % IV SOLN: INTRAVENOUS | Status: AC

## 2017-06-06 MED ORDER — IOPAMIDOL (ISOVUE-300) INJECTION 61%
INTRAVENOUS | Status: AC
  Filled 2017-06-06: qty 50

## 2017-06-06 MED ORDER — CEFAZOLIN SODIUM-DEXTROSE 2-4 GM/100ML-% IV SOLN
INTRAVENOUS | Status: AC
  Administered 2017-06-06: 2 g
  Filled 2017-06-06: qty 100

## 2017-06-06 MED ORDER — INSULIN ASPART 100 UNIT/ML ~~LOC~~ SOLN
0.0000 [IU] | Freq: Three times a day (TID) | SUBCUTANEOUS | Status: DC
Start: 2017-06-06 — End: 2017-06-06

## 2017-06-06 MED ORDER — BUPIVACAINE HCL (PF) 0.5 % IJ SOLN
INTRAMUSCULAR | Status: AC | PRN
  Administered 2017-06-06: 20 mL

## 2017-06-06 NOTE — Progress Notes (Signed)
Patient ID: Charlotte [name], female   DOB: 17-Jan-1936, 81 y.o.   MRN: [id number]  PROGRESS NOTE    Charlotte [name]  RKY:[id number] DOB: November 15, 1935 DOA: 06/01/2017  PCP: Charlotte Games, MD   Brief Narrative:  81 year old female with atrial fibrillation (on xarelto), CAD, diabetes, chronic diastolic CHF, hypertension who presented to ED with ongoing back pain and falls. She was found to have L3 vertebral compression fracture. IR consulted for vertebroplasty/kyphoplasty.   Assessment & Plan:   Active Problems:   Acute L3 compression fracture / Persistent low back pain without sciatica / Frequent falls  - Pt presented to ED with back pain - Pt found to have an acute/subacute superior endplate compression fracture at L3 with 30% loss of height anteriorly - Pt will have vertebroplasty today - SNF recommended on discharge per PT eval - Continue pain management efforts    Hypokalemia - Supplemented - Follow up BMP in am    Leukocytosis / Enterococcus UTI - Enterococcus species on urine cx 8/21 but no growth on this admission urine cx - Continue Unasyn through tomorrow    CKD stage 3 - Baseline Cr 1.19 in 02/2017 - Cr within baseline range     Anemia of chronic disease - Due to CKD - Hgb stable     Paroxysmal atrial fibrillation  - CHA2DS2-VASc Score is 5 - On xarelto outpt - On heparin drip for the procedure today    Diabetes mellitus with diabetic nephropathy and neuropathy - At home she takes metformin  - Continue SSI    Chronic diastolic CHF - Compensated - Continue lasix and coreg     Essential hypertension - Continue coreg and verapamil   DVT prophylaxis: on Heparin drip Code Status: DNR/DNI Family Communication: no family at the bedside Disposition Plan: vertebroplasty today   Consultants:   IR  PT  Procedures:   None  Antimicrobials:   Unasyn 8/25  -->   Subjective: No overnight events.  Objective: Vitals:   06/05/17 0525 06/05/17 1540  06/05/17 2140 06/06/17 0617  BP: (!) 128/53 (!) 129/50 (!) 139/53 133/65  Pulse: (!) 58 63 63 64  Resp: 18 19 20 20   Temp: 97.9 F (36.6 C) 98.4 F (36.9 C) 98.1 F (36.7 C) 97.9 F (36.6 C)  TempSrc: Oral Oral Oral Oral  SpO2: 94% 98% 98% 96%  Weight:      Height:        Intake/Output Summary (Last 24 hours) at 06/06/17 0933 Last data filed at 06/06/17 0600  Gross per 24 hour  Intake             1384 ml  Output             1100 ml  Net              284 ml   Filed Weights   06/02/17 0050  Weight: 109.5 kg (241 lb 6.5 oz)   Physical Exam  Constitutional: Appears well-developed and well-nourished. No distress.  CVS: RRR, S1/S2 (+) Pulmonary: Effort and breath sounds normal, no stridor, rhonchi, wheezes, rales.  Abdominal: Soft. BS +,  no distension, tenderness, rebound or guarding.  Musculoskeletal: Normal range of motion. No edema and no tenderness.  Lymphadenopathy: No lymphadenopathy noted, cervical, inguinal. Neuro: Alert. Normal reflexes, muscle tone coordination. No cranial nerve deficit. Skin: Skin is warm and dry. No rash noted. Not diaphoretic. No erythema. No pallor.  Psychiatric: Normal mood and affect. Behavior, judgment, thought content normal.  Data Reviewed: I have personally reviewed following labs and imaging studies  CBC:  Recent Labs Lab 06/01/17 0735 06/02/17 0517 06/03/17 0144 06/04/17 0425 06/05/17 0435 06/06/17 0433  WBC 7.6 8.0 7.6 10.9* 7.3 7.2  NEUTROABS 5.1  --   --   --   --   --   HGB 9.9* 9.8* 9.0* 9.7* 9.0* 9.0*  HCT 31.3* 30.8* 28.3* 30.7* 28.3* 28.8*  MCV 77.5* 77.2* 78.8 78.3 78.6 79.1  PLT 244 262 245 260 249 285   Basic Metabolic Panel:  Recent Labs Lab 06/01/17 0735  06/02/17 0517 06/03/17 0919 06/04/17 0425 06/05/17 0435 06/06/17 0433  NA 133*  < > 137 134* 139 136 138  K 2.6*  < > 3.0* 3.0* 2.7* 3.1* 3.4*  CL 93*  < > 96* 97* 99* 101 103  CO2 32  < > 32 28 29 27 27   GLUCOSE 128*  < > 121* 111* 124* 119* 98   BUN 39*  < > 36* 42* 34* 30* 24*  CREATININE 0.82  < > 0.90 1.01* 0.86 0.83 0.80  CALCIUM 9.2  < > 9.2 8.7* 8.8* 8.6* 8.7*  MG 1.8  --  1.7  --   --   --   --   PHOS  --   --  4.2  --   --   --   --   < > = values in this interval not displayed. GFR: Estimated Creatinine Clearance: 67.2 mL/min (by C-G formula based on SCr of 0.8 mg/dL). Liver Function Tests:  Recent Labs Lab 06/01/17 2351 06/02/17 0517 06/04/17 0425  AST 25 18 17   ALT 16 15 14   ALKPHOS 67 63 69  BILITOT 0.8 0.7 0.8  PROT 7.2 6.8 6.5  ALBUMIN 3.4* 3.2* 3.0*   No results for input(s): LIPASE, AMYLASE in the last 168 hours. No results for input(s): AMMONIA in the last 168 hours. Coagulation Profile:  Recent Labs Lab 06/06/17 0433  INR 1.01   Cardiac Enzymes: No results for input(s): CKTOTAL, CKMB, CKMBINDEX, TROPONINI in the last 168 hours. BNP (last 3 results)  Recent Labs  05/21/17 1510  PROBNP 332   HbA1C: No results for input(s): HGBA1C in the last 72 hours. CBG:  Recent Labs Lab 06/05/17 1650 06/05/17 2140 06/06/17 0743  GLUCAP 115* 123* 107*   Lipid Profile: No results for input(s): CHOL, HDL, LDLCALC, TRIG, CHOLHDL, LDLDIRECT in the last 72 hours. Thyroid Function Tests: No results for input(s): TSH, T4TOTAL, FREET4, T3FREE, THYROIDAB in the last 72 hours. Anemia Panel: No results for input(s): VITAMINB12, FOLATE, FERRITIN, TIBC, IRON, RETICCTPCT in the last 72 hours. Urine analysis:    Component Value Date/Time   COLORURINE YELLOW 06/01/2017 0648   APPEARANCEUR CLEAR 06/01/2017 0648   LABSPEC <1.005 (L) 06/01/2017 0648   PHURINE 6.5 06/01/2017 0648   GLUCOSEU NEGATIVE 06/01/2017 0648   HGBUR NEGATIVE 06/01/2017 0648   BILIRUBINUR NEGATIVE 06/01/2017 0648   BILIRUBINUR small (A) 05/28/2017 1829   KETONESUR NEGATIVE 06/01/2017 0648   PROTEINUR NEGATIVE 06/01/2017 0648   UROBILINOGEN 0.2 05/28/2017 1829   UROBILINOGEN 0.2 03/23/2008 1050   NITRITE NEGATIVE 06/01/2017 0648    LEUKOCYTESUR NEGATIVE 06/01/2017 0648   Sepsis Labs: @LABRCNTIP (procalcitonin:4,lacticidven:4)   Urine culture     Status: None   Collection Time: 05/28/17  6:33 PM  Result Value Ref Range Status   Organism ID, Bacteria ENTEROCOCCUS SPECIES  Final      Susceptibility  Urine culture     Status: None  Collection Time: 06/01/17  6:48 AM  Result Value Ref Range Status   Special Requests NONE  Final    NO GROWTH   Report Status 06/02/2017 FINAL  Final      Radiology Studies: Mr Lumbar Spine Wo Contrast Result Date: 06/03/2017 1. Acute/subacute superior endplate compression fracture at L3 with 30% loss of height anteriorly but no retropulsed bone. 2. Levoconvex scoliosis at L4-5. 3. Mild subarticular and foraminal narrowing bilaterally at L1-2. 4. Moderate right and mild left subarticular and foraminal stenosis at L2-3. 5. Mild subarticular narrowing bilaterally at L3-4 with moderate right and mild left foraminal stenosis. 6. Moderate subarticular and foraminal stenosis bilaterally at L4-5 with facet spurring contributing to the foraminal disease. 7. Asymmetric left-sided facet hypertrophy and spurring contributes to mild left foraminal stenosis at L5-S1.  Ct Abdomen Pelvis W Contrast Result Date: 06/02/2017 1. Acute on chronic appearing mild superior endplate compression of L3 with fracture lucencies noted anteriorly and right lateral. 2. Degenerative disc disease L1-2 and L3-4 with L4-5 and L5-S1 facet arthropathy. 3. Hepatic and splenic hypodensities too small to further characterize but statistically consistent with cysts or hemangiomata. 4. Mild cardiomegaly.   Scheduled Meds: . carvedilol  6.25 mg Oral BID  . furosemide  10 mg Oral Daily  . gabapentin  300 mg Oral TID  . pantoprazole  40 mg Oral BID AC  . potassium chloride  40 mEq Oral Daily  . psyllium  1 packet Oral Daily  . senna  1 tablet Oral BID  . verapamil  240 mg Oral QHS   Continuous Infusions: . sodium chloride      . ampicillin-sulbactam (UNASYN) IV 3 g (06/06/17 0841)  .  ceFAZolin (ANCEF) IV       LOS: 4 days    Time spent: 25 minutes  Greater than 50% of the time spent on counseling and coordinating the care.   Manson Passey, MD [facility] Pager [phone]  If 7PM-7AM, please contact night-coverage [email or web address] Password [facility] 06/06/2017, 9:33 AM

## 2017-06-06 NOTE — Discharge Instructions (Signed)
1.No stooping,bendinng or lifting more than 10 lbs for 2 weeks. 2.Use walker to ambulate for 2 weeks.Marland Kitchen 3.RTC  PRN  2 weeks

## 2017-06-06 NOTE — Progress Notes (Signed)
PT Cancellation Note  Patient Details Name: Charlotte [name] MRN: [id number] DOB: Mar 18, 1936   Cancelled Treatment:    Reason Eval/Treat Not Completed: Patient at procedure or test/unavailable. Pt had KP/VP at [facility] on today. Pt will be on bedrest for 3-4 hours after procedure. Will check back another day. Thanks.    Rebeca Alert, MPT Pager: [phone]

## 2017-06-06 NOTE — Sedation Documentation (Signed)
Patient is resting comfortably. 

## 2017-06-06 NOTE — Sedation Documentation (Signed)
[facility] to transfer to [facility]

## 2017-06-06 NOTE — Progress Notes (Signed)
Report called to RN at [facility]. VS stable, dsg intact to low back. [facility] present to transfer pt back to [facility].

## 2017-06-06 NOTE — Sedation Documentation (Signed)
dsg to low back intact  

## 2017-06-06 NOTE — Progress Notes (Signed)
ANTICOAGULATION CONSULT NOTE - f/u Consult  Pharmacy Consult for Heparin  \Indication: atrial fibrillation  (PTA xarelto)  Allergies  Allergen Reactions  . Amlodipine Besylate-Valsartan Other (See Comments)    HEADACHE     Patient Measurements: Height: 5' 2.5" (158.8 cm) Weight: 241 lb 6.5 oz (109.5 kg) IBW/kg (Calculated) : 51.25 Heparin Dosing Weight: 78 kg  Vital Signs: Temp: 97.9 F (36.6 C) (08/30 0617) Temp Source: Oral (08/30 0617) BP: 133/65 (08/30 0617) Pulse Rate: 64 (08/30 0617)  Labs:  Recent Labs  06/03/17 2339  06/04/17 0425 06/04/17 0441 06/05/17 0435 06/06/17 0433  HGB  --   < > 9.7*  --  9.0* 9.0*  HCT  --   --  30.7*  --  28.3* 28.8*  PLT  --   --  260  --  249 285  APTT 78*  --   --  71* 104*  --   LABPROT  --   --   --   --   --  13.2  INR  --   --   --   --   --  1.01  HEPARINUNFRC  --   --  0.73*  --  0.38 0.19*  CREATININE  --   --  0.86  --  0.83 0.80  < > = values in this interval not displayed.  Estimated Creatinine Clearance: 67.2 mL/min (by C-G formula based on SCr of 0.8 mg/dL).   Assessment: 81yo F admitted with back pain secondary to compression fracture of L3. Patient on chronic Xarelto for paroxysmal A.fib, last dose taken 8/25 at 17:15. Pharmacy is asked to start heparin drip while holding oral anticoagulation for possible vertebroplasty or kyphoplasty.  Today, 06/06/17:    Heparin level 0.19, subtherapeutic, on heparin infusion at 1100 units/hr  CBC: Hgb 9, low but stable. Pltc WNL.   No bleeding or line issues reported.   Goal of Therapy:  aPTT 66-102 seconds Heparin level 0.3-0.7 units/ml Monitor platelets by anticoagulation protocol: Yes   Plan:   Increase to heparin infusion at 1250 units/hr   Heparin level in 8 hours after rate change  Daily CBC and heparin level   Monitor closely for s/sx of bleeding  F/u plans for vertebroplasty/kyphoplasty at [facility] on 8/30 at 10 AM (stop Heparin 2 hours prior) and when to  restart post-procedure.   Lynann Beaver PharmD, BCPS Pager [phone] 06/06/2017 7:26 AM

## 2017-06-06 NOTE — Progress Notes (Signed)
Brief ANTICOAGULATION CONSULT NOTE - f/u Consult  Pharmacy Consult for Heparin  Indication: atrial fibrillation  (PTA xarelto)  Allergies  Allergen Reactions  . Amlodipine Besylate-Valsartan Other (See Comments)    HEADACHE    Assessment: 81yo F admitted with back pain secondary to compression fracture of L3. Patient on chronic Xarelto for paroxysmal A.fib, currently on Heparin infusion prior to kyphoplasty.   AM Heparin level 0.19, subtherapeutic, on heparin infusion at 1100 units/hr.  Heparin was stopped at 0800.  Kyphoplasty completed at [facility] on 8/30 at 12:46  No bleeding or line issues reported.   Goal of Therapy:  Heparin level 0.3-0.7 units/ml Monitor platelets by anticoagulation protocol: Yes   Plan:   Resume heparin infusion at 1250 units/hr   Heparin level in 8 hours after restarting  Daily CBC and heparin level   Monitor closely for s/sx of bleeding  Lynann Beaver PharmD, BCPS Pager [phone] 06/06/2017 3:34 PM

## 2017-06-06 NOTE — Procedures (Signed)
S/P L3 Balloon KP 

## 2017-06-06 NOTE — Sedation Documentation (Signed)
  MD requests UA be sent prior to starting procedure

## 2017-06-07 ENCOUNTER — Other Ambulatory Visit: Payer: Self-pay | Admitting: Radiology

## 2017-06-07 ENCOUNTER — Encounter (HOSPITAL_COMMUNITY): Payer: Self-pay | Admitting: Interventional Radiology

## 2017-06-07 DIAGNOSIS — R296 Repeated falls: Secondary | ICD-10-CM | POA: Diagnosis not present

## 2017-06-07 DIAGNOSIS — I110 Hypertensive heart disease with heart failure: Secondary | ICD-10-CM | POA: Diagnosis not present

## 2017-06-07 DIAGNOSIS — S32030A Wedge compression fracture of third lumbar vertebra, initial encounter for closed fracture: Secondary | ICD-10-CM | POA: Diagnosis not present

## 2017-06-07 DIAGNOSIS — K219 Gastro-esophageal reflux disease without esophagitis: Secondary | ICD-10-CM | POA: Diagnosis not present

## 2017-06-07 DIAGNOSIS — M549 Dorsalgia, unspecified: Secondary | ICD-10-CM | POA: Diagnosis not present

## 2017-06-07 DIAGNOSIS — Z9889 Other specified postprocedural states: Secondary | ICD-10-CM

## 2017-06-07 DIAGNOSIS — S32030S Wedge compression fracture of third lumbar vertebra, sequela: Secondary | ICD-10-CM | POA: Diagnosis not present

## 2017-06-07 DIAGNOSIS — I4891 Unspecified atrial fibrillation: Secondary | ICD-10-CM | POA: Diagnosis not present

## 2017-06-07 DIAGNOSIS — M545 Low back pain: Secondary | ICD-10-CM | POA: Diagnosis not present

## 2017-06-07 DIAGNOSIS — Z7901 Long term (current) use of anticoagulants: Secondary | ICD-10-CM | POA: Diagnosis not present

## 2017-06-07 DIAGNOSIS — I480 Paroxysmal atrial fibrillation: Secondary | ICD-10-CM | POA: Diagnosis not present

## 2017-06-07 DIAGNOSIS — E1122 Type 2 diabetes mellitus with diabetic chronic kidney disease: Secondary | ICD-10-CM | POA: Diagnosis not present

## 2017-06-07 DIAGNOSIS — E1169 Type 2 diabetes mellitus with other specified complication: Secondary | ICD-10-CM | POA: Diagnosis not present

## 2017-06-07 DIAGNOSIS — I5032 Chronic diastolic (congestive) heart failure: Secondary | ICD-10-CM | POA: Diagnosis not present

## 2017-06-07 DIAGNOSIS — I10 Essential (primary) hypertension: Secondary | ICD-10-CM | POA: Diagnosis not present

## 2017-06-07 DIAGNOSIS — R2689 Other abnormalities of gait and mobility: Secondary | ICD-10-CM | POA: Diagnosis not present

## 2017-06-07 DIAGNOSIS — D509 Iron deficiency anemia, unspecified: Secondary | ICD-10-CM | POA: Diagnosis not present

## 2017-06-07 DIAGNOSIS — K5730 Diverticulosis of large intestine without perforation or abscess without bleeding: Secondary | ICD-10-CM | POA: Diagnosis not present

## 2017-06-07 DIAGNOSIS — S3992XA Unspecified injury of lower back, initial encounter: Secondary | ICD-10-CM | POA: Diagnosis not present

## 2017-06-07 DIAGNOSIS — R278 Other lack of coordination: Secondary | ICD-10-CM | POA: Diagnosis not present

## 2017-06-07 DIAGNOSIS — S32030D Wedge compression fracture of third lumbar vertebra, subsequent encounter for fracture with routine healing: Secondary | ICD-10-CM | POA: Diagnosis not present

## 2017-06-07 DIAGNOSIS — I2510 Atherosclerotic heart disease of native coronary artery without angina pectoris: Secondary | ICD-10-CM | POA: Diagnosis not present

## 2017-06-07 DIAGNOSIS — S32000A Wedge compression fracture of unspecified lumbar vertebra, initial encounter for closed fracture: Secondary | ICD-10-CM | POA: Diagnosis not present

## 2017-06-07 DIAGNOSIS — N390 Urinary tract infection, site not specified: Secondary | ICD-10-CM | POA: Diagnosis not present

## 2017-06-07 DIAGNOSIS — I5020 Unspecified systolic (congestive) heart failure: Secondary | ICD-10-CM | POA: Diagnosis not present

## 2017-06-07 LAB — CBC
HCT: 28.6 % — ABNORMAL LOW (ref 36.0–46.0)
HEMOGLOBIN: 9.2 g/dL — AB (ref 12.0–15.0)
MCH: 25.2 pg — ABNORMAL LOW (ref 26.0–34.0)
MCHC: 32.2 g/dL (ref 30.0–36.0)
MCV: 78.4 fL (ref 78.0–100.0)
PLATELETS: 281 10*3/uL (ref 150–400)
RBC: 3.65 MIL/uL — AB (ref 3.87–5.11)
RDW: 19.5 % — ABNORMAL HIGH (ref 11.5–15.5)
WBC: 6.6 10*3/uL (ref 4.0–10.5)

## 2017-06-07 LAB — GLUCOSE, CAPILLARY
Glucose-Capillary: 108 mg/dL — ABNORMAL HIGH (ref 65–99)
Glucose-Capillary: 112 mg/dL — ABNORMAL HIGH (ref 65–99)

## 2017-06-07 LAB — HEPARIN LEVEL (UNFRACTIONATED): Heparin Unfractionated: 0.12 IU/mL — ABNORMAL LOW (ref 0.30–0.70)

## 2017-06-07 MED ORDER — FUROSEMIDE 20 MG PO TABS: 10.0000 mg | ORAL_TABLET | Freq: Every day | ORAL | 0 refills | Status: DC

## 2017-06-07 MED ORDER — HYDROCODONE-ACETAMINOPHEN 5-325 MG PO TABS: 1.0000 | ORAL_TABLET | Freq: Four times a day (QID) | ORAL | 0 refills | Status: DC | PRN

## 2017-06-07 MED ORDER — RIVAROXABAN 20 MG PO TABS
20.0000 mg | ORAL_TABLET | Freq: Every day | ORAL | Status: DC
  Administered 2017-06-07: 20 mg via ORAL
  Filled 2017-06-07: qty 1

## 2017-06-07 NOTE — Progress Notes (Signed)
Report called to Annette Stable, RN at [employer].  All questions answered.  VSS.  Pt transported via [facility].  VSS.  Daughter aware.  Packet given to [facility].

## 2017-06-07 NOTE — Clinical Social Work Placement (Signed)
   CLINICAL SOCIAL WORK PLACEMENT  NOTE  Date:  06/07/2017  Patient Details  Name: Charlotte [name] MRN: [id number] Date of Birth: October 03, 1936  Clinical Social Work is seeking post-discharge placement for this patient at the Skilled  Nursing Facility level of care (*CSW will initial, date and re-position this form in  chart as items are completed):  Yes   Patient/family provided with [facility] Clinical Social Work Department's list of facilities offering this level of care within the geographic area requested by the patient (or if unable, by the patient's family).  Yes   Patient/family informed of their freedom to choose among providers that offer the needed level of care, that participate in Medicare, Medicaid or managed care program needed by the patient, have an available bed and are willing to accept the patient.  Yes   Patient/family informed of [facility]'s ownership interest in [facility] and [facility], as well as of the fact that they are under no obligation to receive care at these facilities.  PASRR submitted to EDS on       PASRR number received on       Existing PASRR number confirmed on 06/03/17     FL2 transmitted to all facilities in geographic area requested by pt/family on 06/03/17     FL2 transmitted to all facilities within larger geographic area on       Patient informed that his/her managed care company has contracts with or will negotiate with certain facilities, including the following:        Yes   Patient/family informed of bed offers received.  Patient chooses bed at [facility]     Physician recommends and patient chooses bed at      Patient to be transferred to [facility] on  .  Patient to be transferred to facility by [facility]     Patient family notified on 06/07/17 of transfer.  Name of family member notified:  daughter     PHYSICIAN       Additional Comment: Pt / daughter are in agreement with dc  to [facility] today. [employer] has provided authorization for SNF placement. [facility] transport required. Daughter is aware out of pocket costs may be associated with [facility] transport. [facility] Summary has been sent to SNF for review. Scripts included in [employer]. # for report has been provided to nsg.    _______________________________________________ Royetta Asal, LCSW  [phone] 06/07/2017, 2:49 PM

## 2017-06-07 NOTE — Discharge Instructions (Signed)

## 2017-06-07 NOTE — Care Management Note (Signed)
Case Management Note  Patient Details  Name: LONZETTA JR MRN: [id number] Date of Birth: 1936-03-27  Subjective/Objective:  CSW aware of d/c SNF-working on auth.                  Action/Plan:d/c SNF.   Expected Discharge Date:  06/07/17               Expected Discharge Plan:  Skilled Nursing Facility  In-House Referral:  Clinical Social Work  Discharge planning Services  CM Consult  Post Acute Care Choice:    Choice offered to:     DME Arranged:    DME Agency:     HH Arranged:    HH Agency:     Status of Service:  Completed, signed off  If discussed at [employer], dates discussed:    Additional Comments:  Lanier Clam, RN 06/07/2017, 9:21 AM

## 2017-06-07 NOTE — Progress Notes (Signed)
Occupational Therapy Treatment Patient Details Name: Charlotte [name] MRN: [id number] DOB: 09/27/1936 Today's Date: 06/07/2017    History of present illness 81 y.o. female with medical history significant of paroxysmal Afibon xarelto, CAD, DM,and chronic diastolic CHF, hypertension, GERD, spinal stenosis admitted with B hip/knee pain x 3 weeks, 2 falls just PTA, L3 compression fx. (more details: Charlotte [name] is a 81 y.o. female presenting to ED after just being released from ED and unable to even move from stretcher to her bed at home, so she was brought back. She recently was seen at Urgent Care for worsening back pain over last 3 weeks, then fell on 8/23 and 8/24 with even greater severe pain in her mid back. She had a recent  admisson to the hospital from 05/21/17 to 05/23/17 for a GI bleed. Hx of chronic back pain and bilateral knee replacement about 10 years ago.  She lives alone and typically walks with a cane at home but since last few weeks she has been using her walker. )   OT comments  Pt doing much better! She feels pain is improved  Follow Up Recommendations  SNF    Equipment Recommendations  None recommended by OT       Precautions / Restrictions Precautions Precautions: Fall;Back Spinal Brace:  (pt stated MD said she did not need brace) Restrictions Weight Bearing Restrictions: No       Mobility Bed Mobility Overal bed mobility: Needs Assistance Bed Mobility: Rolling;Sidelying to Sit Rolling: Min assist Sidelying to sit: Min assist       General bed mobility comments: encouraged use of bed rail and log rolling. Increased time. Cues for technique.   Transfers Overall transfer level: Needs assistance Equipment used: Rolling walker (2 wheeled) Transfers: Sit to/from [employer] Sit to Stand: Min assist;From elevated surface Stand pivot transfers: Min assist                ADL  either performed or assessed with clinical judgement   ADL Overall ADL's : Needs assistance/impaired Eating/Feeding: Sitting;Set up                       Toilet Transfer: Minimal assistance;RW;Ambulation;BSC;Cueing for safety;Cueing for sequencing;Stand-pivot   Toileting- Clothing Manipulation and Hygiene: Maximal assistance;Sit to/from stand;Cueing for safety;Cueing for sequencing;Cueing for back precautions         General ADL Comments: pt stated MD told her she did not need brace     Vision Baseline Vision/History: No visual deficits Patient Visual Report: No change from baseline            Cognition Arousal/Alertness: Awake/alert Behavior During Therapy: WFL for tasks assessed/performed Overall Cognitive Status: Within Functional Limits for tasks assessed                                                     Pertinent Vitals/ Pain       Pain Score: 3  Pain Descriptors / Indicators: Sore;Discomfort Pain Intervention(s): Limited activity within patient's tolerance;Monitored during session         Frequency  Min 2X/week        Progress Toward Goals  OT Goals(current goals can now be found in the care plan section)  Progress towards OT goals: Progressing toward goals     Plan Discharge plan remains  appropriate       AM-PAC PT "6 Clicks" Daily Activity     Outcome Measure   Help from another person eating meals?: None Help from another person taking care of personal grooming?: A Little Help from another person toileting, which includes using toliet, bedpan, or urinal?: A Lot Help from another person bathing (including washing, rinsing, drying)?: A Lot Help from another person to put on and taking off regular upper body clothing?: A Little Help from another person to put on and taking off regular lower body clothing?: A Lot 6 Click Score: 16    End of Session Equipment Utilized During Treatment: Rolling walker  OT Visit  Diagnosis: Pain;Muscle weakness (generalized) (M62.81);Unsteadiness on feet (R26.81) Pain - part of body:  (back)   Activity Tolerance Patient limited by pain   Patient Left with call bell/phone within reach;with family/visitor present;in chair;with chair alarm set   Nurse Communication Mobility status        Time: 1010-1034 OT Time Calculation (min): 24 min  Charges: OT General Charges $OT Visit: 1 Visit OT Treatments $Self Care/Home Management : 23-37 mins  [place], [place] [phone]   Alba Cory 06/07/2017, 12:39 PM

## 2017-06-07 NOTE — Discharge Summary (Signed)
Physician Discharge Summary  Charlotte [name] WLS:[id number] DOB: 04-13-1936 DOA: 06/01/2017  PCP: Agapito Games, MD  Admit date: 06/01/2017 Discharge date: 06/07/2017  Recommendations for Outpatient Follow-up:  1. Check CBC and BMP per SNF protocol   Discharge Diagnoses:  Active Problems:   Low back pain without sciatica   HYPERTENSION, BENIGN SYSTEMIC   Long term current use of anticoagulant therapy   Diabetes mellitus type 2 in obese (HCC)   Atrial fibrillation (HCC)   Symptomatic anemia   Low back pain   Intractable low back pain   Falls   Intractable back pain   Compression fracture of L3 lumbar vertebra (HCC)    Discharge Condition: stable   Diet recommendation: as tolerated   History of present illness:  81 year old female with atrial fibrillation (on xarelto), CAD, diabetes, chronic diastolic CHF, hypertension who presented to ED with ongoing back pain and falls. She was found to have L3 vertebral compression fracture. IR consulted for vertebroplasty/kyphoplasty.  Hospital Course:   Active Problems:   Acute L3 compression fracture / Persistent low back pain without sciatica / Frequent falls  - Pt presented to ED with back pain - Pt found to have an acute/subacute superior endplate compression fracture at L3 with 30% loss of height anteriorly - Pt had L3 VP/KP 8/30 - Pain well controlled  - SNF on discharge per PT recommendation     Hypokalemia - Supplemented     Leukocytosis / Enterococcus UTI - Enterococcus species on urine cx 8/21 but no growth on this admission urine cx - Stop Unasyn today     CKD stage 3 - Baseline Cr 1.19 in 02/2017 - Cr within baseline range      Anemia of chronic disease - Due to CKD - Hgb stable     Paroxysmal atrial fibrillation  - CHA2DS2-VASc Score is 5 - Resume xarelto    Diabetes mellitus with diabetic nephropathy and neuropathy - Continue home meds    Chronic diastolic CHF - Compensated - Continue lasix and  coreg     Essential hypertension - Continue coreg and verapamil   DVT prophylaxis: on Heparin drip Code Status: DNR/DNI Family Communication: no family at the bedside    Consultants:   IR  PT  Procedures:   L3 VP/KP 8/30  Antimicrobials:   Unasyn 8/25  -->  Signed:  Manson Passey, MD  [facility] 06/07/2017, 8:21 AM  Pager #: [phone]  Time spent in minutes: more than 30 minutes    Discharge Exam: Vitals:   06/06/17 2106 06/07/17 0441  BP: (!) 136/52 128/76  Pulse: 64 61  Resp: 16 18  Temp: 98.1 F (36.7 C) 98.1 F (36.7 C)  SpO2: 98% 95%   Vitals:   06/06/17 1407 06/06/17 1800 06/06/17 2106 06/07/17 0441  BP: (!) 148/69  (!) 136/52 128/76  Pulse: (!) 56 77 64 61  Resp: 18  16 18   Temp: 97.7 F (36.5 C)  98.1 F (36.7 C) 98.1 F (36.7 C)  TempSrc: Oral  Oral Oral  SpO2: 94%  98% 95%  Weight:      Height:        General: Pt is alert, follows commands appropriately, not in acute distress Cardiovascular: Regular rate and rhythm, S1/S2 +, no murmurs Respiratory: Clear to auscultation bilaterally, no wheezing, no crackles, no rhonchi Abdominal: Soft, non tender, non distended, bowel sounds +, no guarding Extremities: no edema, no cyanosis, pulses palpable bilaterally DP and PT Neuro: Grossly nonfocal  Discharge Instructions  Discharge Instructions    Call MD for:  persistant nausea and vomiting    Complete by:  As directed    Call MD for:  severe uncontrolled pain    Complete by:  As directed    Diet - low sodium heart healthy    Complete by:  As directed    Increase activity slowly    Complete by:  As directed      Allergies as of 06/07/2017      Reactions   Amlodipine Besylate-valsartan Other (See Comments)   HEADACHE      Medication List    STOP taking these medications   amoxicillin 500 MG capsule Commonly known as:  AMOXIL   oxyCODONE 5 MG immediate release tablet Commonly known as:  ROXICODONE     TAKE  these medications   Acetaminophen 500 MG coapsule Take 500 mg by mouth every 6 (six) hours as needed for pain.   AMBULATORY NON FORMULARY MEDICATION Disp 1 walker for use as needed due to frequent falls and back pain   CALCIUM 600/VITAMIN D3 600-800 MG-UNIT Tabs Generic drug:  Calcium Carb-Cholecalciferol Take 1 tablet by mouth 2 (two) times daily.   carvedilol 6.25 MG tablet Commonly known as:  COREG TAKE 1 TABLET TWICE DAILY   cyclobenzaprine 5 MG tablet Commonly known as:  FLEXERIL Take 1 tablet (5 mg total) by mouth 3 (three) times daily as needed for muscle spasms.   FISH OIL PO Take 1 tablet by mouth daily.   furosemide 20 MG tablet Commonly known as:  LASIX Take 0.5 tablets (10 mg total) by mouth daily. What changed:  how much to take  when to take this  additional instructions   gabapentin 300 MG capsule Commonly known as:  NEURONTIN TAKE 1 CAPSULE FOUR TIMES DAILY What changed:  See the new instructions.   HYDROcodone-acetaminophen 5-325 MG tablet Commonly known as:  NORCO/VICODIN Take 1 tablet by mouth every 6 (six) hours as needed for severe pain.   iron polysaccharides 150 MG capsule Commonly known as:  NU-IRON Take 1 capsule (150 mg total) by mouth daily.   losartan 100 MG tablet Commonly known as:  COZAAR TAKE 1 TABLET EVERY DAY   metFORMIN 500 MG tablet Commonly known as:  GLUCOPHAGE TAKE 1 TABLET TWICE DAILY WITH MEALS   metolazone 2.5 MG tablet Commonly known as:  ZAROXOLYN TAKE 1 TABLET EVERY OTHER DAY   multivitamin capsule Take 1 capsule by mouth daily.   pantoprazole 40 MG tablet Commonly known as:  PROTONIX Take 1 tablet (40 mg total) by mouth 2 (two) times daily before a meal. Please take twice a day before meals for 2 WEEKS, then daily thereafter   potassium chloride 10 MEQ tablet Commonly known as:  K-DUR,KLOR-CON Take 1 tablet (10 mEq total) by mouth 2 (two) times daily.   psyllium 95 % Pack Commonly known as:   HYDROCIL/METAMUCIL Take 1 packet by mouth daily.   REFRESH 1.4-0.6 % ophthalmic solution Generic drug:  polyvinyl alcohol-povidone Place 1-2 drops into both eyes daily as needed (FOR DRY EYES).   rivaroxaban 20 MG Tabs tablet Commonly known as:  XARELTO TAKE 20 mg TABLET EVERY DAY WITH SUPPER, start on 05/25/17   verapamil 240 MG 24 hr capsule Commonly known as:  VERELAN PM TAKE 1 CAPSULE AT BEDTIME   vitamin B-12 1000 MCG tablet Commonly known as:  CYANOCOBALAMIN Take 1,000 mcg by mouth daily.   VITAMIN D PO Take 1 tablet by  mouth daily.            Discharge Care Instructions        Start     Ordered   06/07/17 0000  furosemide (LASIX) 20 MG tablet  Daily     06/07/17 0820   06/07/17 0000  HYDROcodone-acetaminophen (NORCO/VICODIN) 5-325 MG tablet  Every 6 hours PRN     06/07/17 0820   06/07/17 0000  Increase activity slowly     06/07/17 0820   06/07/17 0000  Diet - low sodium heart healthy     06/07/17 0820   06/07/17 0000  Call MD for:  persistant nausea and vomiting     06/07/17 0820   06/07/17 0000  Call MD for:  severe uncontrolled pain     06/07/17 0820      Contact information for follow-up providers    Agapito Games, MD. Schedule an appointment as soon as possible for a visit in 2 week(s).   Specialty:  Family Medicine Contact information: [address] [place] [address] [phone]            Contact information for after-discharge care    Destination    [facility] Follow up.   Specialty:  Skilled Nursing Facility Contact information: [address] [place] [address] [phone]                   The results of significant diagnostics from this hospitalization (including imaging, microbiology, ancillary and laboratory) are listed below for reference.    Significant Diagnostic Studies: Dg Lumbar Spine Complete  Result Date: 06/01/2017 CLINICAL DATA:   Pain after fall. EXAM: LUMBAR SPINE - COMPLETE 4+ VIEW COMPARISON:  May 29, 2017 FINDINGS: Scoliotic curvature of the lumbar spine, apex to the left, is unchanged. Mild wedging of L3 is stable since May 09, 2017, likely chronic. No acute fracture identified. Degenerative changes are seen, most marked at L1-2. Lower lumbar facet degenerative changes are noted. No traumatic malalignment identified. Calcified atherosclerosis is noted. IMPRESSION: 1. Scoliosis and degenerative change. No acute fracture or traumatic malalignment. Electronically Signed   By: Gerome Sam III M.D   On: 06/01/2017 07:31   Dg Lumbar Spine Complete  Result Date: 05/30/2017 CLINICAL DATA:  Low back pain, no injury EXAM: LUMBAR SPINE - COMPLETE 4+ VIEW COMPARISON:  Lumbar spine films of 05/19/2007 FINDINGS: There is little change in slight lumbar curvature convex to the left. The bones are diffusely osteopenic. Degenerative disc disease is most marked at L1-2 where there is considerable loss of joint space and sclerosis with spurring as well as vacuum disc phenomenon. No new compression deformity is seen. On oblique views no pars defect is seen and there is some degenerative change of the lower facet joints. Moderate abdominal aortic atherosclerosis is noted. IMPRESSION: 1. No change in slight curvature of the lumbar spine convex left with diffuse osteopenia. 2. Some progression of degenerative disc disease at L1-2. Electronically Signed   By: Dwyane Dee M.D.   On: 05/30/2017 08:02   Dg Abd 1 View  Result Date: 06/01/2017 CLINICAL DATA:  Abdominal discomfort.  Constipation for 2 days. EXAM: ABDOMEN - 1 VIEW COMPARISON:  None FINDINGS: Moderate fecal loading in the ascending and transverse colon. No bowel obstruction. IMPRESSION: Moderate fecal loading in the ascending and transverse colon. No other acute abnormalities Electronically Signed   By: Gerome Sam III M.D   On: 06/01/2017 07:32   Mr Lumbar  Spine Wo  Contrast  Result Date: 06/03/2017 CLINICAL DATA:  L3 compression fracture. Mid back pain. Unable to ambulate. EXAM: MRI LUMBAR SPINE WITHOUT CONTRAST TECHNIQUE: Multiplanar, multisequence MR imaging of the lumbar spine was performed. No intravenous contrast was administered. COMPARISON:  CT of the abdomen and pelvis 06/02/2017. Lumbar spine radiographs 06/01/2017 and 05/29/2017. FINDINGS: Segmentation: 5 non rib-bearing lumbar type vertebral bodies are present. Alignment: Slight anterolisthesis at L4-5 is stable. AP alignment is otherwise anatomic. Levoconvex curvature of the lumbar spine is centered at L4. Vertebrae: Acute/subacute superior endplate fracture is confirmed at L3. There is 30% loss of height anteriorly. No significant retropulsed bone is present. Chronic endplate marrow changes are present with fatty replacement on the left at L1-2. Mild chronic endplate changes are noted on the right at L3-4. There is diffuse fatty infiltration of the marrow throughout the sacrum. Conus medullaris: Extends to the L1-2 level and appears normal. Paraspinal and other soft tissues: Limited imaging of the abdomen is unremarkable. Disc levels: T12-L1: Negative. L1-2: A broad-based disc protrusion is present. Mild facet hypertrophy is noted bilaterally. This results in mild subarticular and foraminal narrowing bilaterally. L2-3: A broad-based disc protrusion is present. Moderate facet hypertrophy is worse on the right. Moderate right and mild left subarticular and foraminal narrowing is present bilaterally. L3-4: A broad-based disc protrusion is present. Moderate facet hypertrophy is noted bilaterally. Mild subarticular narrowing is noted bilaterally. Facet hypertrophy contributes to moderate right and mild left foraminal stenosis. L4-5: A broad-based disc protrusion is present. Moderate facet hypertrophy is noted bilaterally. Moderate subarticular and foraminal narrowing is present bilaterally, worse on the left. Facet  spurring contributes. L5-S1: Mild facet hypertrophy is worse on the left. Spurring results in mild left foraminal narrowing. The central canal is patent. IMPRESSION: 1. Acute/subacute superior endplate compression fracture at L3 with 30% loss of height anteriorly but no retropulsed bone. 2. Levoconvex scoliosis at L4-5. 3. Mild subarticular and foraminal narrowing bilaterally at L1-2. 4. Moderate right and mild left subarticular and foraminal stenosis at L2-3. 5. Mild subarticular narrowing bilaterally at L3-4 with moderate right and mild left foraminal stenosis. 6. Moderate subarticular and foraminal stenosis bilaterally at L4-5 with facet spurring contributing to the foraminal disease. 7. Asymmetric left-sided facet hypertrophy and spurring contributes to mild left foraminal stenosis at L5-S1. Electronically Signed   By: Marin Roberts M.D.   On: 06/03/2017 08:40   Ct Abdomen Pelvis W Contrast  Result Date: 06/02/2017 CLINICAL DATA:  Progressive low back pain, recent admission 05/21/2017 for gastrointestinal bleeding. EXAM: CT ABDOMEN AND PELVIS WITH CONTRAST TECHNIQUE: Multidetector CT imaging of the abdomen and pelvis was performed using the standard protocol following bolus administration of intravenous contrast. CONTRAST:  ISOVUE-300 IOPAMIDOL (ISOVUE-300) INJECTION 61% COMPARISON:  Abdomen and lumbar spine radiographs dating back through 05/19/2007 FINDINGS: Lower chest: Cardiomegaly. No pericardial effusion. Minimal bibasilar atelectasis. No pneumonic consolidation or pneumothorax. No pleural effusion. Hepatobiliary: The at least 3 subcentimeter hypodensities are noted within the liver, two in the right hepatic lobe and one on the left statistically consistent with cysts or hemangiomata. Pancreas: Unremarkable. No pancreatic ductal dilatation or surrounding inflammatory changes. Spleen: No splenomegaly. Nonspecific 8 mm hypodensity is noted possibly representing small hemangioma or  hematopoietic rest. Adrenals/Urinary Tract: Adrenal glands are unremarkable. Kidneys are normal, without renal calculi, focal lesion, or hydronephrosis. Bladder is unremarkable. Stomach/Bowel: Stomach is within normal limits. Appendix appears normal. No evidence of bowel wall thickening, distention, or inflammatory changes. Vascular/Lymphatic: Aortic atherosclerosis. No enlarged abdominal or pelvic  lymph nodes. Reproductive: Status post hysterectomy. No adnexal masses. Other: None Musculoskeletal: Recent appearing superior endplate compression of L3 slightly more accentuated than on prior studies dating back 2008. No retropulsion is seen. The fractures are seen along the anterior right lateral aspect of the vertebral body. Chronic stable degenerative disc disease L1-2 and L3-4. Lower lumbar facet arthropathy is identified with facet joint space narrowing and hypertrophy at L4-5 and L5-S1. IMPRESSION: 1. Acute on chronic appearing mild superior endplate compression of L3 with fracture lucencies noted anteriorly and right lateral. 2. Degenerative disc disease L1-2 and L3-4 with L4-5 and L5-S1 facet arthropathy. 3. Hepatic and splenic hypodensities too small to further characterize but statistically consistent with cysts or hemangiomata. 4. Mild cardiomegaly. Electronically Signed   By: Tollie Eth M.D.   On: 06/02/2017 01:10    Microbiology: Recent Results (from the past 240 hour(s))  Urine culture     Status: None   Collection Time: 05/28/17  6:33 PM  Result Value Ref Range Status   Culture ENTEROCOCCUS SPECIES  Final   Colony Count Greater than 100,000 CFU/mL  Final   Organism ID, Bacteria ENTEROCOCCUS SPECIES  Final      Susceptibility   Enterococcus species -  (no method available)    AMPICILLIN <=2 Sensitive     LEVOFLOXACIN 0.5 Sensitive     NITROFURANTOIN <=16 Sensitive     VANCOMYCIN 1 Sensitive     TETRACYCLINE >=16 Resistant   Urine culture     Status: None   Collection Time: 06/01/17   6:48 AM  Result Value Ref Range Status   Specimen Description URINE, RANDOM  Final   Special Requests NONE  Final   Culture   Final    NO GROWTH Performed at [facility], [address] [place], [place] [address]    Report Status 06/02/2017 FINAL  Final     Labs: Basic Metabolic Panel:  Recent Labs Lab 06/01/17 0735  06/02/17 0517 06/03/17 0919 06/04/17 0425 06/05/17 0435 06/06/17 0433  NA 133*  < > 137 134* 139 136 138  K 2.6*  < > 3.0* 3.0* 2.7* 3.1* 3.4*  CL 93*  < > 96* 97* 99* 101 103  CO2 32  < > 32 28 29 27 27   GLUCOSE 128*  < > 121* 111* 124* 119* 98  BUN 39*  < > 36* 42* 34* 30* 24*  CREATININE 0.82  < > 0.90 1.01* 0.86 0.83 0.80  CALCIUM 9.2  < > 9.2 8.7* 8.8* 8.6* 8.7*  MG 1.8  --  1.7  --   --   --   --   PHOS  --   --  4.2  --   --   --   --   < > = values in this interval not displayed. Liver Function Tests:  Recent Labs Lab 06/01/17 2351 06/02/17 0517 06/04/17 0425  AST 25 18 17   ALT 16 15 14   ALKPHOS 67 63 69  BILITOT 0.8 0.7 0.8  PROT 7.2 6.8 6.5  ALBUMIN 3.4* 3.2* 3.0*   No results for input(s): LIPASE, AMYLASE in the last 168 hours. No results for input(s): AMMONIA in the last 168 hours. CBC:  Recent Labs Lab 06/01/17 0735  06/03/17 0144 06/04/17 0425 06/05/17 0435 06/06/17 0433 06/07/17 0456  WBC 7.6  < > 7.6 10.9* 7.3 7.2 6.6  NEUTROABS 5.1  --   --   --   --   --   --  HGB 9.9*  < > 9.0* 9.7* 9.0* 9.0* 9.2*  HCT 31.3*  < > 28.3* 30.7* 28.3* 28.8* 28.6*  MCV 77.5*  < > 78.8 78.3 78.6 79.1 78.4  PLT 244  < > 245 260 249 285 281  < > = values in this interval not displayed. Cardiac Enzymes: No results for input(s): CKTOTAL, CKMB, CKMBINDEX, TROPONINI in the last 168 hours. BNP: BNP (last 3 results) No results for input(s): BNP in the last 8760 hours.  ProBNP (last 3 results)  Recent Labs  05/21/17 1510  PROBNP 332    CBG:  Recent Labs Lab 06/06/17 0743 06/06/17 1404 06/06/17 1627 06/06/17 2105  06/07/17 0800  GLUCAP 107* 116* 185* 121* 108*

## 2017-06-07 NOTE — Progress Notes (Signed)
ANTICOAGULATION CONSULT NOTE - Follow Up Consult  Pharmacy Consult for Heparin Indication: atrial fibrillation PTA xarelto  Allergies  Allergen Reactions  . Amlodipine Besylate-Valsartan Other (See Comments)    HEADACHE     Patient Measurements: Height: 5' 2.5" (158.8 cm) Weight: 241 lb 6.5 oz (109.5 kg) IBW/kg (Calculated) : 51.25 Heparin Dosing Weight:   Vital Signs: Temp: 98.1 F (36.7 C) (08/31 0441) Temp Source: Oral (08/31 0441) BP: 128/76 (08/31 0441) Pulse Rate: 61 (08/31 0441)  Labs:  Recent Labs  06/05/17 0435 06/06/17 0433 06/07/17 0456  HGB 9.0* 9.0* 9.2*  HCT 28.3* 28.8* 28.6*  PLT 249 285 281  APTT 104*  --   --   LABPROT  --  13.2  --   INR  --  1.01  --   HEPARINUNFRC 0.38 0.19* 0.12*  CREATININE 0.83 0.80  --     Estimated Creatinine Clearance: 67.2 mL/min (by C-G formula based on SCr of 0.8 mg/dL).   Medications:  Infusions:  . sodium chloride    . ampicillin-sulbactam (UNASYN) IV Stopped (06/07/17 0232)  .  ceFAZolin (ANCEF) IV    . heparin 1,250 Units/hr (06/06/17 2009)    Assessment: Patient with low heparin level.  No heparin issues per RN.  Goal of Therapy:  Heparin level 0.3-0.7 units/ml Monitor platelets by anticoagulation protocol: Yes   Plan:  Increase heparin to 1400 units/hr Recheck level at [address], [place] 06/07/2017,6:07 AM

## 2017-06-11 DIAGNOSIS — E1122 Type 2 diabetes mellitus with diabetic chronic kidney disease: Secondary | ICD-10-CM | POA: Diagnosis not present

## 2017-06-11 DIAGNOSIS — I5032 Chronic diastolic (congestive) heart failure: Secondary | ICD-10-CM | POA: Diagnosis not present

## 2017-06-11 DIAGNOSIS — S32030D Wedge compression fracture of third lumbar vertebra, subsequent encounter for fracture with routine healing: Secondary | ICD-10-CM | POA: Diagnosis not present

## 2017-06-11 DIAGNOSIS — I480 Paroxysmal atrial fibrillation: Secondary | ICD-10-CM | POA: Diagnosis not present

## 2017-06-12 ENCOUNTER — Ambulatory Visit: Payer: Medicare HMO | Admitting: Family Medicine

## 2017-06-12 ENCOUNTER — Other Ambulatory Visit (HOSPITAL_COMMUNITY): Payer: Self-pay | Admitting: Radiology

## 2017-06-12 DIAGNOSIS — I4891 Unspecified atrial fibrillation: Secondary | ICD-10-CM | POA: Diagnosis not present

## 2017-06-12 DIAGNOSIS — I110 Hypertensive heart disease with heart failure: Secondary | ICD-10-CM | POA: Diagnosis not present

## 2017-06-12 DIAGNOSIS — I2510 Atherosclerotic heart disease of native coronary artery without angina pectoris: Secondary | ICD-10-CM | POA: Diagnosis not present

## 2017-06-12 DIAGNOSIS — S32000A Wedge compression fracture of unspecified lumbar vertebra, initial encounter for closed fracture: Secondary | ICD-10-CM | POA: Diagnosis not present

## 2017-06-12 DIAGNOSIS — I10 Essential (primary) hypertension: Secondary | ICD-10-CM | POA: Diagnosis not present

## 2017-06-12 DIAGNOSIS — I5020 Unspecified systolic (congestive) heart failure: Secondary | ICD-10-CM | POA: Diagnosis not present

## 2017-06-12 DIAGNOSIS — M545 Low back pain: Secondary | ICD-10-CM | POA: Diagnosis not present

## 2017-06-14 DIAGNOSIS — D509 Iron deficiency anemia, unspecified: Secondary | ICD-10-CM | POA: Diagnosis not present

## 2017-06-14 DIAGNOSIS — K5730 Diverticulosis of large intestine without perforation or abscess without bleeding: Secondary | ICD-10-CM | POA: Diagnosis not present

## 2017-06-18 DIAGNOSIS — S32030D Wedge compression fracture of third lumbar vertebra, subsequent encounter for fracture with routine healing: Secondary | ICD-10-CM | POA: Diagnosis not present

## 2017-06-18 DIAGNOSIS — N390 Urinary tract infection, site not specified: Secondary | ICD-10-CM | POA: Diagnosis not present

## 2017-06-18 DIAGNOSIS — I480 Paroxysmal atrial fibrillation: Secondary | ICD-10-CM | POA: Diagnosis not present

## 2017-06-18 DIAGNOSIS — I5032 Chronic diastolic (congestive) heart failure: Secondary | ICD-10-CM | POA: Diagnosis not present

## 2017-06-19 ENCOUNTER — Other Ambulatory Visit: Payer: Self-pay | Admitting: *Deleted

## 2017-06-19 NOTE — Patient Outreach (Signed)
[facility]) Care Management  06/19/2017  JULANNE SCHLUETER 22-May-1936 [id number]   Met with Vickii Chafe, SW at facility. She reports patient lives alone, but her daughter lives next door and is supportive. She expects patient to discharge next week.   Met with patient at bedside of facility. She had her 2 sisters and brother visiting with her.  RNCM introduced self and left [facility] brochure and information for patient to review.   Plan to sign off at this time. Patient has [facility] information for reference. Royetta Crochet. Laymond Purser, RN, BSN, [place] [phone]) Business Cell  [phone]) Toll Free Office

## 2017-06-25 ENCOUNTER — Telehealth: Payer: Self-pay | Admitting: Family Medicine

## 2017-06-25 ENCOUNTER — Ambulatory Visit: Payer: Medicare HMO | Admitting: Family Medicine

## 2017-06-25 DIAGNOSIS — K219 Gastro-esophageal reflux disease without esophagitis: Secondary | ICD-10-CM | POA: Diagnosis not present

## 2017-06-25 DIAGNOSIS — I4891 Unspecified atrial fibrillation: Secondary | ICD-10-CM | POA: Diagnosis not present

## 2017-06-25 DIAGNOSIS — I5020 Unspecified systolic (congestive) heart failure: Secondary | ICD-10-CM | POA: Diagnosis not present

## 2017-06-25 DIAGNOSIS — Z7982 Long term (current) use of aspirin: Secondary | ICD-10-CM | POA: Diagnosis not present

## 2017-06-25 DIAGNOSIS — I2510 Atherosclerotic heart disease of native coronary artery without angina pectoris: Secondary | ICD-10-CM | POA: Diagnosis not present

## 2017-06-25 DIAGNOSIS — M545 Low back pain: Secondary | ICD-10-CM | POA: Diagnosis not present

## 2017-06-25 DIAGNOSIS — I110 Hypertensive heart disease with heart failure: Secondary | ICD-10-CM | POA: Diagnosis not present

## 2017-06-25 NOTE — Telephone Encounter (Signed)
Charlotte [name],  I   spoke to [name] and she says  to duplicate last FMLA and that would suffice.  Thank you.

## 2017-06-26 DIAGNOSIS — K219 Gastro-esophageal reflux disease without esophagitis: Secondary | ICD-10-CM | POA: Diagnosis not present

## 2017-06-26 DIAGNOSIS — I2510 Atherosclerotic heart disease of native coronary artery without angina pectoris: Secondary | ICD-10-CM | POA: Diagnosis not present

## 2017-06-26 DIAGNOSIS — I5020 Unspecified systolic (congestive) heart failure: Secondary | ICD-10-CM | POA: Diagnosis not present

## 2017-06-26 DIAGNOSIS — Z7982 Long term (current) use of aspirin: Secondary | ICD-10-CM | POA: Diagnosis not present

## 2017-06-26 DIAGNOSIS — M545 Low back pain: Secondary | ICD-10-CM | POA: Diagnosis not present

## 2017-06-26 DIAGNOSIS — I110 Hypertensive heart disease with heart failure: Secondary | ICD-10-CM | POA: Diagnosis not present

## 2017-06-26 DIAGNOSIS — I4891 Unspecified atrial fibrillation: Secondary | ICD-10-CM | POA: Diagnosis not present

## 2017-06-27 DIAGNOSIS — I2510 Atherosclerotic heart disease of native coronary artery without angina pectoris: Secondary | ICD-10-CM | POA: Diagnosis not present

## 2017-06-27 DIAGNOSIS — I4891 Unspecified atrial fibrillation: Secondary | ICD-10-CM | POA: Diagnosis not present

## 2017-06-27 DIAGNOSIS — Z7982 Long term (current) use of aspirin: Secondary | ICD-10-CM | POA: Diagnosis not present

## 2017-06-27 DIAGNOSIS — K219 Gastro-esophageal reflux disease without esophagitis: Secondary | ICD-10-CM | POA: Diagnosis not present

## 2017-06-27 DIAGNOSIS — I5020 Unspecified systolic (congestive) heart failure: Secondary | ICD-10-CM | POA: Diagnosis not present

## 2017-06-27 DIAGNOSIS — M545 Low back pain: Secondary | ICD-10-CM | POA: Diagnosis not present

## 2017-06-27 DIAGNOSIS — I110 Hypertensive heart disease with heart failure: Secondary | ICD-10-CM | POA: Diagnosis not present

## 2017-07-01 ENCOUNTER — Ambulatory Visit (HOSPITAL_COMMUNITY)
Admission: RE | Admit: 2017-07-01 | Discharge: 2017-07-01 | Disposition: A | Discharge status: Home / Self Care | Payer: Medicare HMO | Source: Ambulatory Visit | Attending: Radiology | Admitting: Radiology

## 2017-07-01 DIAGNOSIS — M4856XS Collapsed vertebra, not elsewhere classified, lumbar region, sequela of fracture: Secondary | ICD-10-CM | POA: Diagnosis not present

## 2017-07-01 DIAGNOSIS — Z9889 Other specified postprocedural states: Secondary | ICD-10-CM

## 2017-07-01 HISTORY — PX: IR RADIOLOGIST EVAL & MGMT: IMG5224

## 2017-07-02 ENCOUNTER — Encounter (HOSPITAL_COMMUNITY): Payer: Self-pay | Admitting: Interventional Radiology

## 2017-07-02 DIAGNOSIS — Z7982 Long term (current) use of aspirin: Secondary | ICD-10-CM | POA: Diagnosis not present

## 2017-07-02 DIAGNOSIS — I5020 Unspecified systolic (congestive) heart failure: Secondary | ICD-10-CM | POA: Diagnosis not present

## 2017-07-02 DIAGNOSIS — I4891 Unspecified atrial fibrillation: Secondary | ICD-10-CM | POA: Diagnosis not present

## 2017-07-02 DIAGNOSIS — M545 Low back pain: Secondary | ICD-10-CM | POA: Diagnosis not present

## 2017-07-02 DIAGNOSIS — I110 Hypertensive heart disease with heart failure: Secondary | ICD-10-CM | POA: Diagnosis not present

## 2017-07-02 DIAGNOSIS — K219 Gastro-esophageal reflux disease without esophagitis: Secondary | ICD-10-CM | POA: Diagnosis not present

## 2017-07-02 DIAGNOSIS — I2510 Atherosclerotic heart disease of native coronary artery without angina pectoris: Secondary | ICD-10-CM | POA: Diagnosis not present

## 2017-07-03 ENCOUNTER — Telehealth: Payer: Self-pay | Admitting: *Deleted

## 2017-07-03 DIAGNOSIS — M545 Low back pain: Secondary | ICD-10-CM | POA: Diagnosis not present

## 2017-07-03 DIAGNOSIS — Z7982 Long term (current) use of aspirin: Secondary | ICD-10-CM | POA: Diagnosis not present

## 2017-07-03 DIAGNOSIS — I5020 Unspecified systolic (congestive) heart failure: Secondary | ICD-10-CM | POA: Diagnosis not present

## 2017-07-03 DIAGNOSIS — I110 Hypertensive heart disease with heart failure: Secondary | ICD-10-CM | POA: Diagnosis not present

## 2017-07-03 DIAGNOSIS — I4891 Unspecified atrial fibrillation: Secondary | ICD-10-CM | POA: Diagnosis not present

## 2017-07-03 DIAGNOSIS — K219 Gastro-esophageal reflux disease without esophagitis: Secondary | ICD-10-CM | POA: Diagnosis not present

## 2017-07-03 DIAGNOSIS — I2510 Atherosclerotic heart disease of native coronary artery without angina pectoris: Secondary | ICD-10-CM | POA: Diagnosis not present

## 2017-07-04 ENCOUNTER — Encounter: Payer: Self-pay | Admitting: Family Medicine

## 2017-07-04 ENCOUNTER — Ambulatory Visit (INDEPENDENT_AMBULATORY_CARE_PROVIDER_SITE_OTHER): Payer: Medicare HMO | Admitting: Family Medicine

## 2017-07-04 VITALS — BP 116/52 | HR 63 | Temp 97.9°F | Wt 248.0 lb

## 2017-07-04 DIAGNOSIS — S32039A Unspecified fracture of third lumbar vertebra, initial encounter for closed fracture: Secondary | ICD-10-CM

## 2017-07-04 DIAGNOSIS — I5020 Unspecified systolic (congestive) heart failure: Secondary | ICD-10-CM | POA: Diagnosis not present

## 2017-07-04 DIAGNOSIS — Z23 Encounter for immunization: Secondary | ICD-10-CM | POA: Diagnosis not present

## 2017-07-04 DIAGNOSIS — K5909 Other constipation: Secondary | ICD-10-CM

## 2017-07-04 DIAGNOSIS — K254 Chronic or unspecified gastric ulcer with hemorrhage: Secondary | ICD-10-CM

## 2017-07-04 DIAGNOSIS — D500 Iron deficiency anemia secondary to blood loss (chronic): Secondary | ICD-10-CM

## 2017-07-04 DIAGNOSIS — E876 Hypokalemia: Secondary | ICD-10-CM

## 2017-07-04 DIAGNOSIS — I110 Hypertensive heart disease with heart failure: Secondary | ICD-10-CM | POA: Diagnosis not present

## 2017-07-04 DIAGNOSIS — I4891 Unspecified atrial fibrillation: Secondary | ICD-10-CM | POA: Diagnosis not present

## 2017-07-04 DIAGNOSIS — I2510 Atherosclerotic heart disease of native coronary artery without angina pectoris: Secondary | ICD-10-CM | POA: Diagnosis not present

## 2017-07-04 DIAGNOSIS — M545 Low back pain: Secondary | ICD-10-CM | POA: Diagnosis not present

## 2017-07-04 DIAGNOSIS — K219 Gastro-esophageal reflux disease without esophagitis: Secondary | ICD-10-CM | POA: Diagnosis not present

## 2017-07-04 DIAGNOSIS — Z7982 Long term (current) use of aspirin: Secondary | ICD-10-CM | POA: Diagnosis not present

## 2017-07-04 NOTE — Progress Notes (Signed)
Subjective:    Patient ID: Charlotte [name], female    DOB: [birth year], 81 y.o.   MRN: [id number]  HPI   81 year old female comes in today for recent hospital follow-up. She actually presented to the emergency department Initially with a GI bleed. While there she was experiencing back pain. Shortly after she went back to the hospital for persistent more severe back pain and falls. She has a history of atrial fibrillation, coronary artery disease, diabetes and diastolic heart failure and hypertension. She was found to have an L3 vertebral compression fracture and was treated with kyphoplasty. She was discharged home to a skilled nursing facility. With the recommendation for physical therapy. She was noted to have some hypokalemia while there as well to enterococcus UTI. She was released from the skilled nursing facility about a week and half ago and has been home since then. Her daughter has been with her. She just feels fatigued and a little weak. She is getting some home physical therapy and occupational therapy. They are coming out 3-4 days per week. She is able to walk some with her walker but still just feels weak. Her back pain is well controlled. She says she just is experiencing some soreness currently. She was noted to have hypokalemia this times that she was admitted to the hospital.   Diabetes - no hypoglycemic events. No wounds or sores that are not healing well. No increased thirst or urination. Checking glucose at home. Taking medications as prescribed without any side effects.  Lab Results  Component Value Date   HGBA1C 5.4 06/06/2017   Chronic constipation-she's been taking some over-the-counter laxatives without any significant relief. Last bowel movement was yesterday but it was mostly pebbles. She's tried MiraLAX. She said she went through a whole jar of it didn't seem to really help. She is currently on iron because of the GI hemorrhage and unfortunately that's very  constipating.    Review of Systems  BP (!) 116/52   Pulse 63   Temp 97.9 F (36.6 C) (Oral)   Wt 248 lb (112.5 kg)   SpO2 98%   BMI 44.64 kg/m     Allergies  Allergen Reactions  . Amlodipine Besylate-Valsartan Other (See Comments)    HEADACHE     Past Medical History:  Diagnosis Date  . Aortic insufficiency   . Atrial fibrillation ([place])    a. s/p DCCV 03/2013  . CAD (coronary artery disease)    LHC 06/2010: EF 55%, mild plaque in the LAD 20-30%, otherwise normal coronary arteries  . CATARACTS   . Chronic systolic heart failure ([place])    in setting of AFib;  Echocardiogram 02/2013: Moderate LVH, EF 25-30%, anteroseptal and apical HK, moderate AI, MAC, moderate MR, moderate LAE, mild RAE, PASP 32 => after DCCV f/u Echo 8/14: Moderate LVH, EF 60-65%, normal wall motion, grade 1 diastolic dysfunction, moderate AI, moderate LAE  . Complication of anesthesia 2009   nausea and vomitting  . Diabetes mellitus without complication ([place])   . GERD (gastroesophageal reflux disease)   . HYPERLIPIDEMIA, MILD   . HYPERTENSION, BENIGN SYSTEMIC   . Irritable bowel syndrome   . LBBB (left bundle branch block)   . MEDIAL EPICONDYLITIS   . NSVT (nonsustained ventricular tachycardia) ([place])    during Dob Echo 2011 => normal cath  . OSTEOARTHRITIS   . OSTEOPENIA   . RHINITIS, ALLERGIC   . SYNCOPE   . VITAMIN D DEFICIENCY     Past Surgical History:  Procedure Laterality Date  . CARDIOVERSION N/A 04/01/2013   Procedure: CARDIOVERSION;  Surgeon: Josue Hector, MD;  Location: [place];  Service: Cardiovascular;  Laterality: N/A;  . CATARACT EXTRACTION, BILATERAL  2012   Dr. Herbert Deaner  . COLONOSCOPY WITH PROPOFOL N/A 05/23/2017   Procedure: COLONOSCOPY WITH PROPOFOL;  Surgeon: Wonda Horner, MD;  Location: [facility] ENDOSCOPY;  Service: Endoscopy;  Laterality: N/A;  . ESOPHAGOGASTRODUODENOSCOPY (EGD) WITH PROPOFOL N/A 05/23/2017   Procedure: ESOPHAGOGASTRODUODENOSCOPY (EGD) WITH PROPOFOL;   Surgeon: Wonda Horner, MD;  Location: [facility] ENDOSCOPY;  Service: Endoscopy;  Laterality: N/A;  . IR KYPHO LUMBAR INC FX REDUCE BONE BX UNI/BIL CANNULATION INC/IMAGING  06/06/2017  . IR RADIOLOGIST EVAL & MGMT  07/01/2017  . RADICAL HYSTERECTOMY    . REPLACEMENT TOTAL KNEE     11/03/07 right....... 03/29/08 left    Social History   Social History  . Marital status: Divorced    Spouse name: N/A  . Number of children: 1  . Years of education: N/A   Occupational History  . Retired Retired   Social History Main Topics  . Smoking status: Former Smoker    Quit date: 10/08/1970  . Smokeless tobacco: Never Used  . Alcohol use No  . Drug use: No  . Sexual activity: Not on file   Other Topics Concern  . Not on file   Social History Narrative  . No narrative on file    Family History  Problem Relation Age of Onset  . Arrhythmia Mother        afib  . Alzheimer's disease Mother   . Colon cancer Father   . Heart attack Father 93  . Diabetes Sister   . Arrhythmia Sister        afib    Outpatient Encounter Prescriptions as of 07/04/2017  Medication Sig  . Acetaminophen 500 MG coapsule Take 500 mg by mouth every 6 (six) hours as needed for pain.   Marland Kitchen AMBULATORY NON FORMULARY MEDICATION Disp 1 walker for use as needed due to frequent falls and back pain  . Calcium Carb-Cholecalciferol (CALCIUM 600/VITAMIN D3) 600-800 MG-UNIT TABS Take 1 tablet by mouth 2 (two) times daily.  . carvedilol (COREG) 6.25 MG tablet TAKE 1 TABLET TWICE DAILY  . Cholecalciferol (VITAMIN D PO) Take 1 tablet by mouth daily.  . cyclobenzaprine (FLEXERIL) 5 MG tablet Take 1 tablet (5 mg total) by mouth 3 (three) times daily as needed for muscle spasms.  . furosemide (LASIX) 20 MG tablet Take 0.5 tablets (10 mg total) by mouth daily.  Marland Kitchen gabapentin (NEURONTIN) 300 MG capsule TAKE 1 CAPSULE FOUR TIMES DAILY (Patient taking differently: TAKE 300 mg CAPSULE Three TIMES DAILY)  . HYDROcodone-acetaminophen (NORCO/VICODIN)  5-325 MG tablet Take 1 tablet by mouth every 6 (six) hours as needed for severe pain.  . iron polysaccharides (NU-IRON) 150 MG capsule Take 1 capsule (150 mg total) by mouth daily.  Marland Kitchen losartan (COZAAR) 100 MG tablet TAKE 1 TABLET EVERY DAY  . metFORMIN (GLUCOPHAGE) 500 MG tablet TAKE 1 TABLET TWICE DAILY WITH MEALS  . metolazone (ZAROXOLYN) 2.5 MG tablet TAKE 1 TABLET EVERY OTHER DAY  . Multiple Vitamin (MULTIVITAMIN) capsule Take 1 capsule by mouth daily.    . Omega-3 Fatty Acids (FISH OIL PO) Take 1 tablet by mouth daily.  . pantoprazole (PROTONIX) 40 MG tablet Take 1 tablet (40 mg total) by mouth 2 (two) times daily before a meal. Please take twice a day before meals for 2 WEEKS, then  daily thereafter  . polyvinyl alcohol-povidone (REFRESH) 1.4-0.6 % ophthalmic solution Place 1-2 drops into both eyes daily as needed (FOR DRY EYES).   . potassium chloride (K-DUR,KLOR-CON) 10 MEQ tablet Take 1 tablet (10 mEq total) by mouth 2 (two) times daily.  . psyllium (HYDROCIL/METAMUCIL) 95 % PACK Take 1 packet by mouth daily.  . rivaroxaban (XARELTO) 20 MG TABS tablet TAKE 20 mg TABLET EVERY DAY WITH SUPPER, start on 05/25/17  . verapamil (VERELAN PM) 240 MG 24 hr capsule TAKE 1 CAPSULE AT BEDTIME  . vitamin B-12 (CYANOCOBALAMIN) 1000 MCG tablet Take 1,000 mcg by mouth daily.     No facility-administered encounter medications on file as of 07/04/2017.          Objective:   Physical Exam  Constitutional: She is oriented to person, place, and time. She appears well-developed and well-nourished.  HENT:  Head: Normocephalic and atraumatic.  Right Ear: External ear normal.  Left Ear: External ear normal.  Nose: Nose normal.  Eyes: Conjunctivae are normal.  Neck: Neck supple.  Cardiovascular: Normal rate, regular rhythm and normal heart sounds.   Pulmonary/Chest: Effort normal and breath sounds normal.  Musculoskeletal: She exhibits no edema.  Neurological: She is alert and oriented to person,  place, and time. A cranial nerve deficit is present.  Skin: Skin is warm and dry.  Psychiatric: She has a normal mood and affect. Her behavior is normal. Judgment and thought content normal.        Assessment & Plan:  Recent GI hemorrhage-she denies any recent blood in the stool but says her stools are very dark because she's on [place] it's very difficult to tell. Continue with iron supplementation.  Iron deficiency secondary to GI loss-recheck CBC and iron levels. Continue to take one iron supplement daily. Continue to take laxative to counteract the constipation. Next  Chronic constipation-complicated by iron supplementation. Recommend a trial of magnesium citrate. Can even go through 2 bottles if need be. If she is able to get her stools to move then recommend she get on MiraLAX nightly as a prophylactic.  Diabetes-last A1c looks fantastic. She did have some questions about her metformin we discussed risks benefits and potential side effects of the medication. We'll continue with this for now since she seems to be tolerating it well. Follow-up in 3 months. Foot exam performed today. NOrmal exam today.   L3 vertebral fracture-status post recent kyphoplasty. Has Artie had a follow-up with interventional radiologist. Pain well controlled.  Hypokalemia-due to recheck potassium.  Time spent 45 min, > 50% spent counesling about GI hemorrhage, iron deficiency, constipation, diabetes, L3 fracture, hypokalemia.

## 2017-07-05 LAB — CBC WITH DIFFERENTIAL/PLATELET
BASOS ABS: 12 {cells}/uL (ref 0–200)
Basophils Relative: 0.2 %
EOS PCT: 2.6 %
Eosinophils Absolute: 161 cells/uL (ref 15–500)
HCT: 32 % — ABNORMAL LOW (ref 35.0–45.0)
Hemoglobin: 10.2 g/dL — ABNORMAL LOW (ref 11.7–15.5)
Lymphs Abs: 1823 cells/uL (ref 850–3900)
MCH: 25.7 pg — AB (ref 27.0–33.0)
MCHC: 31.9 g/dL — AB (ref 32.0–36.0)
MCV: 80.6 fL (ref 80.0–100.0)
MONOS PCT: 9 %
MPV: 12.1 fL (ref 7.5–12.5)
NEUTROS PCT: 58.8 %
Neutro Abs: 3646 cells/uL (ref 1500–7800)
Platelets: 241 10*3/uL (ref 140–400)
RBC: 3.97 10*6/uL (ref 3.80–5.10)
RDW: 19.8 % — AB (ref 11.0–15.0)
TOTAL LYMPHOCYTE: 29.4 %
WBC mixed population: 558 cells/uL (ref 200–950)
WBC: 6.2 10*3/uL (ref 3.8–10.8)

## 2017-07-05 LAB — BASIC METABOLIC PANEL WITH GFR
BUN: 21 mg/dL (ref 7–25)
CHLORIDE: 104 mmol/L (ref 98–110)
CO2: 23 mmol/L (ref 20–32)
Calcium: 9.3 mg/dL (ref 8.6–10.4)
Creat: 0.72 mg/dL (ref 0.60–0.93)
GFR, EST AFRICAN AMERICAN: 92 mL/min/{1.73_m2} (ref >=60)
GFR, Est Non African American: 80 mL/min/{1.73_m2} (ref >=60)
GLUCOSE: 89 mg/dL (ref 65–99)
POTASSIUM: 3.8 mmol/L (ref 3.5–5.3)
SODIUM: 139 mmol/L (ref 135–146)

## 2017-07-05 LAB — FERRITIN: FERRITIN: 14 ng/mL — AB (ref 20–288)

## 2017-07-08 DIAGNOSIS — K219 Gastro-esophageal reflux disease without esophagitis: Secondary | ICD-10-CM | POA: Diagnosis not present

## 2017-07-08 DIAGNOSIS — I110 Hypertensive heart disease with heart failure: Secondary | ICD-10-CM | POA: Diagnosis not present

## 2017-07-08 DIAGNOSIS — M545 Low back pain: Secondary | ICD-10-CM | POA: Diagnosis not present

## 2017-07-08 DIAGNOSIS — I5020 Unspecified systolic (congestive) heart failure: Secondary | ICD-10-CM | POA: Diagnosis not present

## 2017-07-08 DIAGNOSIS — Z7982 Long term (current) use of aspirin: Secondary | ICD-10-CM | POA: Diagnosis not present

## 2017-07-08 DIAGNOSIS — I2510 Atherosclerotic heart disease of native coronary artery without angina pectoris: Secondary | ICD-10-CM | POA: Diagnosis not present

## 2017-07-08 DIAGNOSIS — I4891 Unspecified atrial fibrillation: Secondary | ICD-10-CM | POA: Diagnosis not present

## 2017-07-08 NOTE — Telephone Encounter (Signed)
Error

## 2017-07-09 DIAGNOSIS — I110 Hypertensive heart disease with heart failure: Secondary | ICD-10-CM | POA: Diagnosis not present

## 2017-07-09 DIAGNOSIS — Z7982 Long term (current) use of aspirin: Secondary | ICD-10-CM | POA: Diagnosis not present

## 2017-07-09 DIAGNOSIS — I4891 Unspecified atrial fibrillation: Secondary | ICD-10-CM | POA: Diagnosis not present

## 2017-07-09 DIAGNOSIS — M545 Low back pain: Secondary | ICD-10-CM | POA: Diagnosis not present

## 2017-07-09 DIAGNOSIS — I2510 Atherosclerotic heart disease of native coronary artery without angina pectoris: Secondary | ICD-10-CM | POA: Diagnosis not present

## 2017-07-09 DIAGNOSIS — I5020 Unspecified systolic (congestive) heart failure: Secondary | ICD-10-CM | POA: Diagnosis not present

## 2017-07-09 DIAGNOSIS — K219 Gastro-esophageal reflux disease without esophagitis: Secondary | ICD-10-CM | POA: Diagnosis not present

## 2017-07-10 DIAGNOSIS — Z7982 Long term (current) use of aspirin: Secondary | ICD-10-CM | POA: Diagnosis not present

## 2017-07-10 DIAGNOSIS — I5020 Unspecified systolic (congestive) heart failure: Secondary | ICD-10-CM | POA: Diagnosis not present

## 2017-07-10 DIAGNOSIS — I4891 Unspecified atrial fibrillation: Secondary | ICD-10-CM | POA: Diagnosis not present

## 2017-07-10 DIAGNOSIS — K219 Gastro-esophageal reflux disease without esophagitis: Secondary | ICD-10-CM | POA: Diagnosis not present

## 2017-07-10 DIAGNOSIS — I110 Hypertensive heart disease with heart failure: Secondary | ICD-10-CM | POA: Diagnosis not present

## 2017-07-10 DIAGNOSIS — I2510 Atherosclerotic heart disease of native coronary artery without angina pectoris: Secondary | ICD-10-CM | POA: Diagnosis not present

## 2017-07-10 DIAGNOSIS — M545 Low back pain: Secondary | ICD-10-CM | POA: Diagnosis not present

## 2017-07-11 ENCOUNTER — Telehealth: Payer: Self-pay | Admitting: Cardiovascular Disease

## 2017-07-11 NOTE — Telephone Encounter (Signed)
°  New Prob  Patient c/o Palpitations:  High priority if patient c/o lightheadedness, shortness of breath, or chest pain  1) How long have you had palpitations/irregular HR/ Afib? Are you having the symptoms now? Pts Mother states symptoms have been ongoing for a few days now.   2) Are you currently experiencing lightheadedness, SOB or CP? Weakness  3) Do you have a history of afib (atrial fibrillation) or irregular heart rhythm? Yes, history of A-Fib  4) Have you checked your BP or HR? (document readings if available): Checked pulse last night. States it was very irregular  5) Are you experiencing any other symptoms? No

## 2017-07-11 NOTE — Telephone Encounter (Signed)
Called patient daughter back about her message. Daughter stated that patient had been complaining of weakness and having an irregular heart rate. Physical Therapy has come out to the patient's home and cancel patient's sessions due to not being able to get a BP on patient and irregular heart rate. Patient's daughter, who is a Engineer, civil (consulting), was unable to get a reading on patient's BP monitor. Informed patient's daughter should should take BP manually. Patient's daughter reported that patient's BP was low at doctor's office last week 116/52.  Informed patient's daughter that patient has A. FIB and asked if she is taking all her medications. Patient is taking xarelto 20 mg, verapamil 240 mg, losartan 100 mg, Coreg 6.25 mg BID, metolazone 2.5 mg every other day, and  furosemide 10 mg daily. Patient has an office visit on 07/23/17 with Caryl Ada PA.  Will forward to Dr. Eden Emms for further advisement.

## 2017-07-11 NOTE — Telephone Encounter (Signed)
Have her seen tomorrow needs manual BP and ECG she has been in NSR and irregular heart beat may just be PVC;s Also needs BMET and CBC has had significant anemia recently

## 2017-07-11 NOTE — Telephone Encounter (Signed)
Follow Up ° ° ° °Returning call from earlier. Please call. °

## 2017-07-11 NOTE — Telephone Encounter (Signed)
Called patient's daughter back with Dr. Fabio Bering recommendations. Made an appointment for patient to see Dr. Okey Dupre DOD tomorrow. Patient will get lab work at visit.

## 2017-07-11 NOTE — Telephone Encounter (Signed)
Called patient's daughter back (DPR), she could not talk at this time and requested a call back about 10 minutes. Will call patient's daughter back around 10:00 am.

## 2017-07-12 ENCOUNTER — Encounter: Payer: Self-pay | Admitting: Internal Medicine

## 2017-07-12 ENCOUNTER — Encounter: Payer: Self-pay | Admitting: *Deleted

## 2017-07-12 ENCOUNTER — Ambulatory Visit (INDEPENDENT_AMBULATORY_CARE_PROVIDER_SITE_OTHER): Payer: Medicare HMO | Admitting: Internal Medicine

## 2017-07-12 VITALS — BP 112/52 | HR 106 | Ht 62.5 in | Wt 245.0 lb

## 2017-07-12 DIAGNOSIS — I480 Paroxysmal atrial fibrillation: Secondary | ICD-10-CM | POA: Diagnosis not present

## 2017-07-12 DIAGNOSIS — I10 Essential (primary) hypertension: Secondary | ICD-10-CM

## 2017-07-12 DIAGNOSIS — I428 Other cardiomyopathies: Secondary | ICD-10-CM

## 2017-07-12 MED ORDER — CARVEDILOL 12.5 MG PO TABS: 12.5000 mg | ORAL_TABLET | Freq: Two times a day (BID) | ORAL | 1 refills | Status: DC

## 2017-07-12 NOTE — Progress Notes (Signed)
Follow-up Outpatient Visit Date: 07/12/2017  Primary Care Provider: [name], MD [facility] [address] [place] [address]  Chief Complaint: Weakness and shortness of breath  HPI:  Charlotte [name] is a 81 y.o. year-old female with history of paroxysmal atrial fibrillation, non-obstructive coronary artery disease, chronic systolic heart failure with normalization of LVEF, hypertension, hyperlipidemia, and diabetes mellitus, who presents as an acute visit for assessment of weakness and shortness of breath. She usually follows with Dr. Johnsie Cancel and was last seen in our office by Truitt Merle, NP, in June. She was hospitalized twice in August, first for a GI bleeding and then for a lumbar spine compression fracture. She felt fine while in rehab after leaving the hospital, but since being back home for the last few weeks, has not felt well. She gets short of breath very easily and also has frequent orthostatic lightheadedness and clamminess. She has not fallen. Over the last week, the physical therapists coming to her home have been unable to obtain a blood pressure.  Today, [name] continues to feel weak and short of breath. She denies swelling and reports that her weight has been stable. She denies palpitations, chest pain, orthopnea, and PND. She has been compliant with rivaroxaban without bleeding; she has taken this uninterrupted for more than 4 weeks.  -------------------------------------------------------------------------------------------------- Past Medical History:  Diagnosis Date  . Aortic insufficiency   . Atrial fibrillation ([place])    a. s/p DCCV 03/2013  . CAD (coronary artery disease)    LHC 06/2010: EF 55%, mild plaque in the LAD 20-30%, otherwise normal coronary arteries  . CATARACTS   . Chronic systolic heart failure ([place])    in setting of AFib;  Echocardiogram 02/2013: Moderate LVH, EF 25-30%, anteroseptal and apical HK, moderate AI, MAC, moderate MR, moderate  LAE, mild RAE, PASP 32 => after DCCV f/u Echo 8/14: Moderate LVH, EF 60-65%, normal wall motion, grade 1 diastolic dysfunction, moderate AI, moderate LAE  . Complication of anesthesia 2009   nausea and vomitting  . Diabetes mellitus without complication ([place])   . GERD (gastroesophageal reflux disease)   . HYPERLIPIDEMIA, MILD   . HYPERTENSION, BENIGN SYSTEMIC   . Irritable bowel syndrome   . LBBB (left bundle branch block)   . MEDIAL EPICONDYLITIS   . NSVT (nonsustained ventricular tachycardia) ([place])    during Dob Echo 2011 => normal cath  . OSTEOARTHRITIS   . OSTEOPENIA   . RHINITIS, ALLERGIC   . SYNCOPE   . VITAMIN D DEFICIENCY    Past Surgical History:  Procedure Laterality Date  . CARDIOVERSION N/A 04/01/2013   Procedure: CARDIOVERSION;  Surgeon: Josue Hector, MD;  Location: [place];  Service: Cardiovascular;  Laterality: N/A;  . CATARACT EXTRACTION, BILATERAL  2012   Dr. Herbert Deaner  . COLONOSCOPY WITH PROPOFOL N/A 05/23/2017   Procedure: COLONOSCOPY WITH PROPOFOL;  Surgeon: Wonda Horner, MD;  Location: [facility] ENDOSCOPY;  Service: Endoscopy;  Laterality: N/A;  . ESOPHAGOGASTRODUODENOSCOPY (EGD) WITH PROPOFOL N/A 05/23/2017   Procedure: ESOPHAGOGASTRODUODENOSCOPY (EGD) WITH PROPOFOL;  Surgeon: Wonda Horner, MD;  Location: [facility] ENDOSCOPY;  Service: Endoscopy;  Laterality: N/A;  . IR KYPHO LUMBAR INC FX REDUCE BONE BX UNI/BIL CANNULATION INC/IMAGING  06/06/2017  . IR RADIOLOGIST EVAL & MGMT  07/01/2017  . RADICAL HYSTERECTOMY    . REPLACEMENT TOTAL KNEE     11/03/07 right....... 03/29/08 left    Current Meds  Medication Sig  . Acetaminophen 500 MG coapsule Take 500 mg  by mouth every 6 (six) hours as needed for pain.   Marland Kitchen AMBULATORY NON FORMULARY MEDICATION Disp 1 walker for use as needed due to frequent falls and back pain  . Calcium Carb-Cholecalciferol (CALCIUM 600/VITAMIN D3) 600-800 MG-UNIT TABS Take 1 tablet by mouth 2 (two) times daily.  . Cholecalciferol (VITAMIN D PO) Take 1  tablet by mouth daily.  . cyclobenzaprine (FLEXERIL) 5 MG tablet Take 1 tablet (5 mg total) by mouth 3 (three) times daily as needed for muscle spasms.  . furosemide (LASIX) 20 MG tablet Take 0.5 tablets (10 mg total) by mouth daily.  Marland Kitchen gabapentin (NEURONTIN) 300 MG capsule TAKE 1 CAPSULE FOUR TIMES DAILY (Patient taking differently: take 1 capsule as needed 3 times a day)  . HYDROcodone-acetaminophen (NORCO/VICODIN) 5-325 MG tablet Take 1 tablet by mouth every 6 (six) hours as needed for severe pain.  . iron polysaccharides (NU-IRON) 150 MG capsule Take 1 capsule (150 mg total) by mouth daily.  . metFORMIN (GLUCOPHAGE) 500 MG tablet TAKE 1 TABLET TWICE DAILY WITH MEALS  . metolazone (ZAROXOLYN) 2.5 MG tablet TAKE 1 TABLET EVERY OTHER DAY  . Multiple Vitamin (MULTIVITAMIN) capsule Take 1 capsule by mouth daily.    . Omega-3 Fatty Acids (FISH OIL PO) Take 1 tablet by mouth daily.  Marland Kitchen oxyCODONE (OXY IR/ROXICODONE) 5 MG immediate release tablet Take 0.5-1 tablets by mouth every 6 (six) hours as needed for severe pain.  . pantoprazole (PROTONIX) 40 MG tablet Take 1 tablet (40 mg total) by mouth 2 (two) times daily before a meal. Please take twice a day before meals for 2 WEEKS, then daily thereafter  . polyvinyl alcohol-povidone (REFRESH) 1.4-0.6 % ophthalmic solution Place 1-2 drops into both eyes daily as needed (FOR DRY EYES).   . potassium chloride (K-DUR,KLOR-CON) 10 MEQ tablet Take 1 tablet (10 mEq total) by mouth 2 (two) times daily.  . psyllium (HYDROCIL/METAMUCIL) 95 % PACK Take 1 packet by mouth daily.  . rivaroxaban (XARELTO) 20 MG TABS tablet TAKE 20 mg TABLET EVERY DAY WITH SUPPER, start on 05/25/17  . verapamil (VERELAN PM) 240 MG 24 hr capsule TAKE 1 CAPSULE AT BEDTIME  . vitamin B-12 (CYANOCOBALAMIN) 1000 MCG tablet Take 1,000 mcg by mouth daily.    . [DISCONTINUED] carvedilol (COREG) 6.25 MG tablet TAKE 1 TABLET TWICE DAILY  . [DISCONTINUED] losartan (COZAAR) 100 MG tablet TAKE 1 TABLET  EVERY DAY   Allergies: Amlodipine besylate-valsartan  Social History   Social History  . Marital status: Divorced    Spouse name: N/A  . Number of children: 1  . Years of education: N/A   Occupational History  . Retired Retired   Social History Main Topics  . Smoking status: Former Smoker    Quit date: 10/08/1970  . Smokeless tobacco: Never Used  . Alcohol use No  . Drug use: No  . Sexual activity: Not on file   Other Topics Concern  . Not on file   Social History Narrative  . No narrative on file    Family History  Problem Relation Age of Onset  . Arrhythmia Mother        afib  . Alzheimer's disease Mother   . Colon cancer Father   . Heart attack Father 46  . Diabetes Sister   . Arrhythmia Sister        afib    Review of Systems: A 12-system review of systems was performed and was negative except as noted in the HPI.  --------------------------------------------------------------------------------------------------  Physical Exam: BP (!) 112/52   Pulse (!) 106   Ht 5' 2.5" (1.588 m)   Wt 245 lb (111.1 kg)   BMI 44.10 kg/m   General:  Obese elderly woman, seated comfortably in the exam room. She is accompanied by her daughter. HEENT: Mild conjunctival pallor noted without scleral icterus. Moist mucous membranes.  OP clear. Neck: Supple without lymphadenopathy, thyromegaly, JVD, or HJR. Lungs: Normal work of breathing. Mildly diminished breath sounds throughout without wheezes or crackles. Heart: Distant heart sounds. Irregularly irregular without murmurs or rubs. Abd: Bowel sounds present. Soft, NT/ND. Unable to assess HSM due to body habitus. Ext: No lower extremity edema. Radial, PT, and DP pulses are 2+ bilaterally. Skin: Warm and dry without rash.  EKG:  Atrial fibrillation (ventricular rate 106 bpm) with PVC and LBBB.  Lab Results  Component Value Date   WBC 6.2 07/04/2017   HGB 10.2 (L) 07/04/2017   HCT 32.0 (L) 07/04/2017   MCV 80.6  07/04/2017   PLT 241 07/04/2017    Lab Results  Component Value Date   NA 139 07/04/2017   K 3.8 07/04/2017   CL 104 07/04/2017   CO2 23 07/04/2017   BUN 21 07/04/2017   CREATININE 0.72 07/04/2017   GLUCOSE 89 07/04/2017   ALT 14 06/04/2017    Lab Results  Component Value Date   CHOL 193 03/01/2017   HDL 54 03/01/2017   LDLCALC 124 (H) 03/01/2017   TRIG 76 03/01/2017   CHOLHDL 3.6 03/01/2017    --------------------------------------------------------------------------------------------------  ASSESSMENT AND PLAN: Paroxysmal atrial fibrillation Ms. Amoroso is in a-fib today, new since most recent EKG in mid August. Ventricular response is mildly elevated in the setting of soft blood pressure; I suspect this is why she has been weak, short of breath, and lightheaded. We have agreed to hold losartan and increase carvedilol to 12.5 mg BID. We will arrange for direct current cardioversion early next week. As Ms. Faul has been on uninterrupted rivaroxaban for >4 weeks, she will not need a TEE first. I will check a CBC and BMP today, given her history of GI bleed earlier this summer.  Hypertension Blood pressure is low normal today in the setting of a-fib with RVR. We will hold losartan and increase carvedilol, as above.  Non-ischemic cardiomyopathy Ms. Bogen appears euvolemic on exam and her weight has declined over the last several months. I do not believe that her current symptoms represent acute heart failure exacerbation. She should continue her current diuretic regimen. We will hold losartan, as above, pending cardioversion.  Follow-up: Return to clinic in ~2 weeks to see Dr. Johnsie Cancel or APP.  Nelva Bush, MD 07/12/2017 1:46 PM

## 2017-07-12 NOTE — Patient Instructions (Signed)
Medication Instructions:  STOP losartan for now. Your blood pressure is on the low side.  Increase coreg (carvedilol) to 12.5 mg two times a day. You can take 2 of your 6.25 mg tablets two times a day at the same time and use your current supply  Labwork: BMET/CBCd/Magnesium level today  Testing/Procedures: Your physician has recommended that you have a Cardioversion (DCCV). Electrical Cardioversion uses a jolt of electricity to your heart either through paddles or wired patches attached to your chest. This is a controlled, usually prescheduled, procedure. Defibrillation is done under light anesthesia in the hospital, and you usually go home the day of the procedure. This is done to get your heart back into a normal rhythm. You are not awake for the procedure. Please see the instruction sheet given to you today.  Tuesday October 9,2018  Follow-Up: Keep the appointment that you already have scheduled on October 16,2018.        If you need a refill on your cardiac medications before your next appointment, please call your pharmacy.

## 2017-07-13 ENCOUNTER — Observation Stay (HOSPITAL_COMMUNITY)
Admission: EM | Admit: 2017-07-13 | Discharge: 2017-07-14 | Disposition: A | Discharge status: Home / Self Care | Payer: Medicare HMO | Attending: Internal Medicine | Admitting: Internal Medicine

## 2017-07-13 ENCOUNTER — Telehealth: Payer: Self-pay | Admitting: Internal Medicine

## 2017-07-13 ENCOUNTER — Other Ambulatory Visit: Payer: Self-pay

## 2017-07-13 ENCOUNTER — Emergency Department (HOSPITAL_COMMUNITY): Payer: Medicare HMO

## 2017-07-13 ENCOUNTER — Encounter (HOSPITAL_COMMUNITY): Payer: Self-pay | Admitting: *Deleted

## 2017-07-13 DIAGNOSIS — M1990 Unspecified osteoarthritis, unspecified site: Secondary | ICD-10-CM | POA: Insufficient documentation

## 2017-07-13 DIAGNOSIS — Z9841 Cataract extraction status, right eye: Secondary | ICD-10-CM | POA: Insufficient documentation

## 2017-07-13 DIAGNOSIS — I7389 Other specified peripheral vascular diseases: Secondary | ICD-10-CM | POA: Insufficient documentation

## 2017-07-13 DIAGNOSIS — M7700 Medial epicondylitis, unspecified elbow: Secondary | ICD-10-CM | POA: Diagnosis not present

## 2017-07-13 DIAGNOSIS — I2510 Atherosclerotic heart disease of native coronary artery without angina pectoris: Secondary | ICD-10-CM | POA: Insufficient documentation

## 2017-07-13 DIAGNOSIS — I480 Paroxysmal atrial fibrillation: Secondary | ICD-10-CM | POA: Insufficient documentation

## 2017-07-13 DIAGNOSIS — I6782 Cerebral ischemia: Secondary | ICD-10-CM | POA: Diagnosis not present

## 2017-07-13 DIAGNOSIS — D649 Anemia, unspecified: Secondary | ICD-10-CM | POA: Insufficient documentation

## 2017-07-13 DIAGNOSIS — R531 Weakness: Secondary | ICD-10-CM | POA: Insufficient documentation

## 2017-07-13 DIAGNOSIS — Z7901 Long term (current) use of anticoagulants: Secondary | ICD-10-CM | POA: Insufficient documentation

## 2017-07-13 DIAGNOSIS — K5909 Other constipation: Secondary | ICD-10-CM | POA: Diagnosis not present

## 2017-07-13 DIAGNOSIS — I5022 Chronic systolic (congestive) heart failure: Secondary | ICD-10-CM | POA: Diagnosis not present

## 2017-07-13 DIAGNOSIS — Z7984 Long term (current) use of oral hypoglycemic drugs: Secondary | ICD-10-CM | POA: Insufficient documentation

## 2017-07-13 DIAGNOSIS — J309 Allergic rhinitis, unspecified: Secondary | ICD-10-CM | POA: Insufficient documentation

## 2017-07-13 DIAGNOSIS — I482 Chronic atrial fibrillation: Secondary | ICD-10-CM | POA: Diagnosis not present

## 2017-07-13 DIAGNOSIS — Z9842 Cataract extraction status, left eye: Secondary | ICD-10-CM | POA: Insufficient documentation

## 2017-07-13 DIAGNOSIS — I351 Nonrheumatic aortic (valve) insufficiency: Secondary | ICD-10-CM | POA: Insufficient documentation

## 2017-07-13 DIAGNOSIS — M19072 Primary osteoarthritis, left ankle and foot: Secondary | ICD-10-CM | POA: Insufficient documentation

## 2017-07-13 DIAGNOSIS — E559 Vitamin D deficiency, unspecified: Secondary | ICD-10-CM | POA: Insufficient documentation

## 2017-07-13 DIAGNOSIS — Z888 Allergy status to other drugs, medicaments and biological substances status: Secondary | ICD-10-CM | POA: Insufficient documentation

## 2017-07-13 DIAGNOSIS — M8580 Other specified disorders of bone density and structure, unspecified site: Secondary | ICD-10-CM | POA: Diagnosis not present

## 2017-07-13 DIAGNOSIS — Z79899 Other long term (current) drug therapy: Secondary | ICD-10-CM | POA: Insufficient documentation

## 2017-07-13 DIAGNOSIS — Z6841 Body Mass Index (BMI) 40.0 and over, adult: Secondary | ICD-10-CM | POA: Insufficient documentation

## 2017-07-13 DIAGNOSIS — I428 Other cardiomyopathies: Secondary | ICD-10-CM | POA: Diagnosis not present

## 2017-07-13 DIAGNOSIS — I472 Ventricular tachycardia: Secondary | ICD-10-CM | POA: Diagnosis not present

## 2017-07-13 DIAGNOSIS — Z87891 Personal history of nicotine dependence: Secondary | ICD-10-CM | POA: Insufficient documentation

## 2017-07-13 DIAGNOSIS — M545 Low back pain: Secondary | ICD-10-CM | POA: Insufficient documentation

## 2017-07-13 DIAGNOSIS — I110 Hypertensive heart disease with heart failure: Secondary | ICD-10-CM | POA: Insufficient documentation

## 2017-07-13 DIAGNOSIS — E119 Type 2 diabetes mellitus without complications: Secondary | ICD-10-CM | POA: Insufficient documentation

## 2017-07-13 DIAGNOSIS — I9789 Other postprocedural complications and disorders of the circulatory system, not elsewhere classified: Secondary | ICD-10-CM | POA: Insufficient documentation

## 2017-07-13 DIAGNOSIS — K219 Gastro-esophageal reflux disease without esophagitis: Secondary | ICD-10-CM | POA: Diagnosis not present

## 2017-07-13 DIAGNOSIS — M19071 Primary osteoarthritis, right ankle and foot: Secondary | ICD-10-CM | POA: Insufficient documentation

## 2017-07-13 DIAGNOSIS — I5032 Chronic diastolic (congestive) heart failure: Secondary | ICD-10-CM | POA: Diagnosis not present

## 2017-07-13 DIAGNOSIS — E785 Hyperlipidemia, unspecified: Secondary | ICD-10-CM | POA: Insufficient documentation

## 2017-07-13 DIAGNOSIS — Z8249 Family history of ischemic heart disease and other diseases of the circulatory system: Secondary | ICD-10-CM | POA: Insufficient documentation

## 2017-07-13 DIAGNOSIS — N179 Acute kidney failure, unspecified: Principal | ICD-10-CM | POA: Diagnosis present

## 2017-07-13 DIAGNOSIS — K589 Irritable bowel syndrome without diarrhea: Secondary | ICD-10-CM | POA: Diagnosis not present

## 2017-07-13 DIAGNOSIS — E871 Hypo-osmolality and hyponatremia: Secondary | ICD-10-CM | POA: Diagnosis not present

## 2017-07-13 DIAGNOSIS — Z96653 Presence of artificial knee joint, bilateral: Secondary | ICD-10-CM | POA: Insufficient documentation

## 2017-07-13 DIAGNOSIS — R9431 Abnormal electrocardiogram [ECG] [EKG]: Secondary | ICD-10-CM | POA: Diagnosis not present

## 2017-07-13 DIAGNOSIS — Z90710 Acquired absence of both cervix and uterus: Secondary | ICD-10-CM | POA: Insufficient documentation

## 2017-07-13 DIAGNOSIS — Z833 Family history of diabetes mellitus: Secondary | ICD-10-CM | POA: Insufficient documentation

## 2017-07-13 DIAGNOSIS — Z800 Family history of malignant neoplasm of digestive organs: Secondary | ICD-10-CM | POA: Insufficient documentation

## 2017-07-13 DIAGNOSIS — S0990XA Unspecified injury of head, initial encounter: Secondary | ICD-10-CM | POA: Diagnosis not present

## 2017-07-13 DIAGNOSIS — Z9181 History of falling: Secondary | ICD-10-CM | POA: Insufficient documentation

## 2017-07-13 LAB — BASIC METABOLIC PANEL
ANION GAP: 14 (ref 5–15)
BUN/Creatinine Ratio: 26 (ref 12–28)
BUN: 57 mg/dL — ABNORMAL HIGH (ref 8–27)
BUN: 58 mg/dL — ABNORMAL HIGH (ref 6–20)
CALCIUM: 9.4 mg/dL (ref 8.9–10.3)
CO2: 19 mmol/L — AB (ref 20–29)
CO2: 20 mmol/L — AB (ref 22–32)
CREATININE: 2.22 mg/dL — AB (ref 0.57–1.00)
CREATININE: 1.92 mg/dL — AB (ref 0.44–1.00)
Calcium: 9.7 mg/dL (ref 8.7–10.3)
Chloride: 98 mmol/L (ref 96–106)
Chloride: 98 mmol/L — ABNORMAL LOW (ref 101–111)
GFR calc Af Amer: 24 mL/min/{1.73_m2} — ABNORMAL LOW (ref >59)
GFR calc non Af Amer: 20 mL/min/{1.73_m2} — ABNORMAL LOW (ref >59)
GFR, EST AFRICAN AMERICAN: 27 mL/min — AB (ref >60)
GFR, EST NON AFRICAN AMERICAN: 24 mL/min — AB (ref >60)
GLUCOSE: 94 mg/dL (ref 65–99)
GLUCOSE: 144 mg/dL — AB (ref 65–99)
POTASSIUM: 4.2 mmol/L (ref 3.5–5.2)
Potassium: 3.4 mmol/L — ABNORMAL LOW (ref 3.5–5.1)
SODIUM: 137 mmol/L (ref 134–144)
Sodium: 132 mmol/L — ABNORMAL LOW (ref 135–145)

## 2017-07-13 LAB — CBC WITH DIFFERENTIAL/PLATELET
Basophils Absolute: 0 10*3/uL (ref 0.0–0.2)
Basos: 0 %
EOS (ABSOLUTE): 0.1 10*3/uL (ref 0.0–0.4)
EOS: 2 %
Hematocrit: 35.3 % (ref 34.0–46.6)
Hemoglobin: 11.6 g/dL (ref 11.1–15.9)
IMMATURE GRANULOCYTES: 0 %
Immature Grans (Abs): 0 10*3/uL (ref 0.0–0.1)
Lymphocytes Absolute: 2.4 10*3/uL (ref 0.7–3.1)
Lymphs: 29 %
MCH: 26 pg — AB (ref 26.6–33.0)
MCHC: 32.9 g/dL (ref 31.5–35.7)
MCV: 79 fL (ref 79–97)
MONOS ABS: 0.8 10*3/uL (ref 0.1–0.9)
Monocytes: 10 %
NEUTROS PCT: 59 %
Neutrophils Absolute: 5 10*3/uL (ref 1.4–7.0)
Platelets: 323 10*3/uL (ref 150–379)
RBC: 4.46 x10E6/uL (ref 3.77–5.28)
RDW: 22.3 % — ABNORMAL HIGH (ref 12.3–15.4)
WBC: 8.3 10*3/uL (ref 3.4–10.8)

## 2017-07-13 LAB — URINALYSIS, ROUTINE W REFLEX MICROSCOPIC
BILIRUBIN URINE: NEGATIVE
GLUCOSE, UA: NEGATIVE mg/dL
HGB URINE DIPSTICK: NEGATIVE
KETONES UR: NEGATIVE mg/dL
Leukocytes, UA: NEGATIVE
Nitrite: NEGATIVE
PROTEIN: NEGATIVE mg/dL
Specific Gravity, Urine: 1.015 (ref 1.005–1.030)
pH: 5 (ref 5.0–8.0)

## 2017-07-13 LAB — CBC
HCT: 32.7 % — ABNORMAL LOW (ref 36.0–46.0)
Hemoglobin: 10.7 g/dL — ABNORMAL LOW (ref 12.0–15.0)
MCH: 26.4 pg (ref 26.0–34.0)
MCHC: 32.7 g/dL (ref 30.0–36.0)
MCV: 80.5 fL (ref 78.0–100.0)
PLATELETS: 281 10*3/uL (ref 150–400)
RBC: 4.06 MIL/uL (ref 3.87–5.11)
RDW: 21.5 % — ABNORMAL HIGH (ref 11.5–15.5)
WBC: 7.7 10*3/uL (ref 4.0–10.5)

## 2017-07-13 LAB — TROPONIN I

## 2017-07-13 LAB — MAGNESIUM: MAGNESIUM: 1.9 mg/dL (ref 1.6–2.3)

## 2017-07-13 LAB — GLUCOSE, CAPILLARY: GLUCOSE-CAPILLARY: 107 mg/dL — AB (ref 65–99)

## 2017-07-13 LAB — BRAIN NATRIURETIC PEPTIDE: B NATRIURETIC PEPTIDE 5: 364.2 pg/mL — AB (ref 0.0–100.0)

## 2017-07-13 MED ORDER — ACETAMINOPHEN 325 MG PO TABS
650.0000 mg | ORAL_TABLET | Freq: Four times a day (QID) | ORAL | Status: DC | PRN
Start: 2017-07-13 — End: 2017-07-14

## 2017-07-13 MED ORDER — CARVEDILOL 12.5 MG PO TABS
12.5000 mg | ORAL_TABLET | Freq: Two times a day (BID) | ORAL | Status: DC
  Administered 2017-07-14: 12.5 mg via ORAL
  Filled 2017-07-13 (×2): qty 1

## 2017-07-13 MED ORDER — HEPARIN SODIUM (PORCINE) 5000 UNIT/ML IJ SOLN
5000.0000 [IU] | Freq: Three times a day (TID) | INTRAMUSCULAR | Status: DC
  Filled 2017-07-13: qty 1

## 2017-07-13 MED ORDER — VERAPAMIL HCL ER 240 MG PO TBCR
240.0000 mg | EXTENDED_RELEASE_TABLET | Freq: Every day | ORAL | Status: DC
  Administered 2017-07-13: 240 mg via ORAL
  Filled 2017-07-13 (×2): qty 1

## 2017-07-13 MED ORDER — ACETAMINOPHEN 650 MG RE SUPP: 650.0000 mg | Freq: Four times a day (QID) | RECTAL | Status: DC | PRN

## 2017-07-13 MED ORDER — INSULIN ASPART 100 UNIT/ML ~~LOC~~ SOLN
0.0000 [IU] | Freq: Three times a day (TID) | SUBCUTANEOUS | Status: DC
Start: 2017-07-14 — End: 2017-07-14

## 2017-07-13 MED ORDER — SODIUM CHLORIDE 0.9 % IV BOLUS (SEPSIS)
500.0000 mL | Freq: Once | INTRAVENOUS | Status: AC
  Administered 2017-07-13: 500 mL via INTRAVENOUS

## 2017-07-13 MED ORDER — RIVAROXABAN 15 MG PO TABS
15.0000 mg | ORAL_TABLET | Freq: Every day | ORAL | Status: DC
  Administered 2017-07-13: 15 mg via ORAL
  Filled 2017-07-13: qty 1

## 2017-07-13 MED ORDER — METOLAZONE 2.5 MG PO TABS: 2.5000 mg | ORAL_TABLET | ORAL | Status: DC

## 2017-07-13 MED ORDER — SODIUM CHLORIDE 0.9 % IV SOLN
INTRAVENOUS | Status: DC
  Administered 2017-07-13: 21:00:00 via INTRAVENOUS

## 2017-07-13 MED ORDER — ONDANSETRON HCL 4 MG PO TABS: 4.0000 mg | ORAL_TABLET | Freq: Four times a day (QID) | ORAL | Status: DC | PRN

## 2017-07-13 MED ORDER — HYDROCODONE-ACETAMINOPHEN 5-325 MG PO TABS: 1.0000 | ORAL_TABLET | ORAL | Status: DC | PRN

## 2017-07-13 MED ORDER — ONDANSETRON HCL 4 MG/2ML IJ SOLN
4.0000 mg | Freq: Four times a day (QID) | INTRAMUSCULAR | Status: DC | PRN
Start: 2017-07-13 — End: 2017-07-14

## 2017-07-13 MED ORDER — GABAPENTIN 300 MG PO CAPS
300.0000 mg | ORAL_CAPSULE | Freq: Two times a day (BID) | ORAL | Status: DC
  Administered 2017-07-13 – 2017-07-14 (×2): 300 mg via ORAL
  Filled 2017-07-13 (×2): qty 1

## 2017-07-13 MED ORDER — INSULIN ASPART 100 UNIT/ML ~~LOC~~ SOLN: 0.0000 [IU] | Freq: Every day | SUBCUTANEOUS | Status: DC

## 2017-07-13 NOTE — ED Triage Notes (Signed)
The pt was seen by her doctor yesterday and had lab work drawn  The doctor called this am and  Told her tht her kidney function tests were wrose.  C/o weakness she fell today  And she is c/o sob.  She lives alone.  She is scheduled for a cardioversion Tuesday for af

## 2017-07-13 NOTE — H&P (Signed)
[facility]                                                                                    Patient Demographics  Charlotte [name], is a 81 y.o. female  CSN: [id number]  MRN: [id number]  DOB - 07/04/1936  Admit Date - 07/13/2017  Outpatient Primary MD for the patient is [name], MD   With History of -  Past Medical History:  Diagnosis Date  . Aortic insufficiency   . Atrial fibrillation ([place])    a. s/p DCCV 03/2013  . CAD (coronary artery disease)    LHC 06/2010: EF 55%, mild plaque in the LAD 20-30%, otherwise normal coronary arteries  . CATARACTS   . Chronic systolic heart failure ([place])    in setting of AFib;  Echocardiogram 02/2013: Moderate LVH, EF 25-30%, anteroseptal and apical HK, moderate AI, MAC, moderate MR, moderate LAE, mild RAE, PASP 32 => after DCCV f/u Echo 8/14: Moderate LVH, EF 60-65%, normal wall motion, grade 1 diastolic dysfunction, moderate AI, moderate LAE  . Complication of anesthesia 2009   nausea and vomitting  . Diabetes mellitus without complication ([place])   . GERD (gastroesophageal reflux disease)   . HYPERLIPIDEMIA, MILD   . HYPERTENSION, BENIGN SYSTEMIC   . Irritable bowel syndrome   . LBBB (left bundle branch block)   . MEDIAL EPICONDYLITIS   . NSVT (nonsustained ventricular tachycardia) ([place])    during Dob Echo 2011 => normal cath  . OSTEOARTHRITIS   . OSTEOPENIA   . RHINITIS, ALLERGIC   . SYNCOPE   . VITAMIN D DEFICIENCY       Past Surgical History:  Procedure Laterality Date  . CARDIOVERSION N/A 04/01/2013   Procedure: CARDIOVERSION;  Surgeon: Josue Hector, MD;  Location: [place];  Service: Cardiovascular;  Laterality: N/A;  . CATARACT EXTRACTION, BILATERAL  2012   Dr. Herbert Deaner  . COLONOSCOPY WITH PROPOFOL N/A 05/23/2017   Procedure: COLONOSCOPY WITH PROPOFOL;  Surgeon: Wonda Horner, MD;  Location: [facility] ENDOSCOPY;  Service: Endoscopy;  Laterality: N/A;  . ESOPHAGOGASTRODUODENOSCOPY (EGD) WITH PROPOFOL N/A  05/23/2017   Procedure: ESOPHAGOGASTRODUODENOSCOPY (EGD) WITH PROPOFOL;  Surgeon: Wonda Horner, MD;  Location: [facility] ENDOSCOPY;  Service: Endoscopy;  Laterality: N/A;  . IR KYPHO LUMBAR INC FX REDUCE BONE BX UNI/BIL CANNULATION INC/IMAGING  06/06/2017  . IR RADIOLOGIST EVAL & MGMT  07/01/2017  . RADICAL HYSTERECTOMY    . REPLACEMENT TOTAL KNEE     11/03/07 right....... 03/29/08 left    in for   Chief Complaint  Patient presents with  . Fall  . Weakness     HPI  Charlotte [name]  is a 81 y.o. female, With past medical history significant for congestive heart failure , hypertension and atrial fib who was sent today to the emergency room from her doctor's office to be evaluated for acute renal failure. Patient was on losartan, Zaroxolyn and Lasix in addition to metformin which were put on hold by Dr. Saunders Revel and patient was sent here for further evaluation. Patient denies any chest pains fever chills nausea vomiting or diarrhea. On her way to the hospital she had a full with  head trauma however CT of the head was negative for acute events.    Review of Systems    In addition to the HPI above,  No Fever-chills, No Headache, No changes with Vision or hearing, No problems swallowing food or Liquids, No Chest pain, Cough or Shortness of Breath, No Abdominal pain, No Nausea or Vommitting, Bowel movements are regular, No Blood in stool or Urine, No dysuria, No new skin rashes or bruises, No new joints pains-aches,  No new weakness, tingling, numbness in any extremity, No recent weight gain or loss, No polyuria, polydypsia or polyphagia, No significant Mental Stressors.  A full 10 point Review of Systems was done, except as stated above, all other Review of Systems were negative.   Social History Social History  Substance Use Topics  . Smoking status: Former Smoker    Quit date: 10/08/1970  . Smokeless tobacco: Never Used  . Alcohol use No     Family History Family History  Problem  Relation Age of Onset  . Arrhythmia Mother        afib  . Alzheimer's disease Mother   . Colon cancer Father   . Heart attack Father 68  . Diabetes Sister   . Arrhythmia Sister        afib     Prior to Admission medications   Medication Sig Start Date End Date Taking? Authorizing Provider  Acetaminophen 500 MG coapsule Take 500 mg by mouth every 6 (six) hours as needed for pain.  04/08/17   [provider]  AMBULATORY NON FORMULARY MEDICATION Disp 1 walker for use as needed due to frequent falls and back pain 05/31/17   Gregor Hams, MD  Calcium Carb-Cholecalciferol (CALCIUM 600/VITAMIN D3) 600-800 MG-UNIT TABS Take 1 tablet by mouth 2 (two) times daily.    [provider]  carvedilol (COREG) 12.5 MG tablet Take 1 tablet (12.5 mg total) by mouth 2 (two) times daily. 07/12/17 10/10/17  End, Harrell Gave, MD  Cholecalciferol (VITAMIN D PO) Take 1 tablet by mouth daily.    [provider]  cyclobenzaprine (FLEXERIL) 5 MG tablet Take 1 tablet (5 mg total) by mouth 3 (three) times daily as needed for muscle spasms. 05/28/17   Noe Gens, PA-C  furosemide (LASIX) 20 MG tablet Take 0.5 tablets (10 mg total) by mouth daily. 06/07/17   Robbie Lis, MD  gabapentin (NEURONTIN) 300 MG capsule TAKE 1 CAPSULE FOUR TIMES DAILY Patient taking differently: take 1 capsule as needed 3 times a day 02/06/17   [name], MD  HYDROcodone-acetaminophen (NORCO/VICODIN) 5-325 MG tablet Take 1 tablet by mouth every 6 (six) hours as needed for severe pain. 06/07/17   Robbie Lis, MD  iron polysaccharides (NU-IRON) 150 MG capsule Take 1 capsule (150 mg total) by mouth daily. 05/23/17   Rai, Vernelle Emerald, MD  metFORMIN (GLUCOPHAGE) 500 MG tablet TAKE 1 TABLET TWICE DAILY WITH MEALS 05/14/17   [name], MD  metolazone (ZAROXOLYN) 2.5 MG tablet TAKE 1 TABLET EVERY OTHER DAY 01/28/17   Josue Hector, MD  Multiple Vitamin (MULTIVITAMIN) capsule Take 1 capsule by mouth daily.       [provider]  Omega-3 Fatty Acids (FISH OIL PO) Take 1 tablet by mouth daily.    [provider]  oxyCODONE (OXY IR/ROXICODONE) 5 MG immediate release tablet Take 0.5-1 tablets by mouth every 6 (six) hours as needed for severe pain. 05/29/17   [provider]  pantoprazole (PROTONIX) 40  MG tablet Take 1 tablet (40 mg total) by mouth 2 (two) times daily before a meal. Please take twice a day before meals for 2 WEEKS, then daily thereafter 05/23/17   Rai, Ripudeep K, MD  polyvinyl alcohol-povidone (REFRESH) 1.4-0.6 % ophthalmic solution Place 1-2 drops into both eyes daily as needed (FOR DRY EYES).     [provider]  potassium chloride (K-DUR,KLOR-CON) 10 MEQ tablet Take 1 tablet (10 mEq total) by mouth 2 (two) times daily. 12/03/16   Josue Hector, MD  psyllium (HYDROCIL/METAMUCIL) 95 % PACK Take 1 packet by mouth daily.    [provider]  rivaroxaban (XARELTO) 20 MG TABS tablet TAKE 20 mg TABLET EVERY DAY WITH SUPPER, start on 05/25/17 05/25/17   Rai, Vernelle Emerald, MD  verapamil (VERELAN PM) 240 MG 24 hr capsule TAKE 1 CAPSULE AT BEDTIME 03/27/17   Josue Hector, MD  vitamin B-12 (CYANOCOBALAMIN) 1000 MCG tablet Take 1,000 mcg by mouth daily.      [provider]    Allergies  Allergen Reactions  . Amlodipine Besylate-Valsartan Other (See Comments)    HEADACHE     Physical Exam  Vitals  Blood pressure 119/72, pulse 80, temperature 98 F (36.7 C), temperature source Oral, resp. rate 17, height 5' 2.5" (1.588 m), weight 111.1 kg (245 lb), SpO2 97 %.   1. General Extremely pleasant female in no acute distress  2. Normal affect and insight, Not Suicidal or Homicidal, Awake Alert, Oriented X 3.  3. No F.N deficits, grossly.  4. Ears and Eyes appear Normal, Conjunctivae clear, PERRLA. Moist Oral Mucosa.  5. Supple Neck, No JVD, No cervical lymphadenopathy appriciated, No Carotid Bruits.  6. Symmetrical Chest wall movement, Good  air movement bilaterally, CTAB.  7. Irregular irregular, No Gallops, Rubs , No Parasternal Heave.  8. Positive Bowel Sounds, Abdomen Soft, Non tender, No organomegaly appriciated,No rebound -guarding or rigidity.  9.  No Cyanosis, Normal Skin Turgor, No Skin Rash or Bruise.  10. Good muscle tone,  joints appear normal , no effusions, Normal ROM.      Data Review  CBC  Recent Labs Lab 07/12/17 1506 07/13/17 1557  WBC 8.3 7.7  HGB 11.6 10.7*  HCT 35.3 32.7*  PLT 323 281  MCV 79 80.5  MCH 26.0* 26.4  MCHC 32.9 32.7  RDW 22.3* 21.5*  LYMPHSABS 2.4  --   EOSABS 0.1  --   BASOSABS 0.0  --    ------------------------------------------------------------------------------------------------------------------  Chemistries   Recent Labs Lab 07/12/17 1506 07/13/17 1557  NA 137 132*  K 4.2 3.4*  CL 98 98*  CO2 19* 20*  GLUCOSE 94 144*  BUN 57* 58*  CREATININE 2.22* 1.92*  CALCIUM 9.7 9.4  MG 1.9  --    ------------------------------------------------------------------------------------------------------------------ estimated creatinine clearance is 28.2 mL/min (A) (by C-G formula based on SCr of 1.92 mg/dL (H)). ------------------------------------------------------------------------------------------------------------------ No results for input(s): TSH, T4TOTAL, T3FREE, THYROIDAB in the last 72 hours.  Invalid input(s): FREET3   Coagulation profile No results for input(s): INR, PROTIME in the last 168 hours. ------------------------------------------------------------------------------------------------------------------- No results for input(s): DDIMER in the last 72 hours. -------------------------------------------------------------------------------------------------------------------  Cardiac Enzymes  Recent Labs Lab 07/13/17 1557  TROPONINI <0.03    ------------------------------------------------------------------------------------------------------------------ Invalid input(s): POCBNP   ---------------------------------------------------------------------------------------------------------------  Urinalysis    Component Value Date/Time   COLORURINE YELLOW 07/13/2017 1823   APPEARANCEUR CLEAR 07/13/2017 1823   LABSPEC 1.015 07/13/2017 1823   PHURINE 5.0 07/13/2017 1823   GLUCOSEU NEGATIVE 07/13/2017 1823  HGBUR NEGATIVE 07/13/2017 [place] 07/13/2017 1823   BILIRUBINUR small (A) 05/28/2017 1829   KETONESUR NEGATIVE 07/13/2017 1823   PROTEINUR NEGATIVE 07/13/2017 1823   UROBILINOGEN 0.2 05/28/2017 1829   UROBILINOGEN 0.2 03/23/2008 1050   NITRITE NEGATIVE 07/13/2017 1823   LEUKOCYTESUR NEGATIVE 07/13/2017 1823    ----------------------------------------------------------------------------------------------------------------  Imaging results:   Dg Chest 2 View  Result Date: 07/13/2017 CLINICAL DATA:  Recent falls.  Weakness. EXAM: CHEST  2 VIEW COMPARISON:  05/31/2010 FINDINGS: The heart size and mediastinal contours are within normal limits. Both lungs are clear. No pleural effusion or pneumothorax. The visualized skeletal structures are unremarkable. IMPRESSION: No active cardiopulmonary disease. Electronically Signed   By: Lajean Manes M.D.   On: 07/13/2017 16:26   Ct Head Wo Contrast  Result Date: 07/13/2017 CLINICAL DATA:  Status post fall, with generalized weakness. Patient on blood thinner. Initial encounter. EXAM: CT HEAD WITHOUT CONTRAST TECHNIQUE: Contiguous axial images were obtained from the base of the skull through the vertex without intravenous contrast. COMPARISON:  None. FINDINGS: Brain: No evidence of acute infarction, hemorrhage, hydrocephalus or extra-axial collection. Prominence of the ventricles and sulci reflects mild to moderate cortical volume loss. Mild subcortical and  periventricular matter change likely reflects small vessel ischemic microangiopathy. The brainstem and fourth ventricle are within normal limits. The basal ganglia are unremarkable in appearance. The cerebral hemispheres demonstrate grossly normal gray-white differentiation. No mass effect or midline shift is seen. Vascular: No hyperdense vessel or unexpected calcification. Skull: There is no evidence of fracture; a diffusely calcified 2.4 cm mass is noted medial to the right temporal lobe, likely reflecting a calcified meningioma. Sinuses/Orbits: The orbits are within normal limits. The paranasal sinuses and mastoid air cells are well-aerated. Other: No significant soft tissue abnormalities are seen. IMPRESSION: 1. No evidence of traumatic intracranial injury or fracture. 2. Mild to moderate cortical volume loss and scattered small vessel ischemic microangiopathy. 3. Diffusely calcified 2.4 cm mass medial to the right temporal lobe, likely reflecting a calcified meningioma. Electronically Signed   By: Garald Balding M.D.   On: 07/13/2017 18:51   Ir Radiologist Eval & Mgmt  Result Date: 07/02/2017 EXAM: ESTABLISHED PATIENT OFFICE VISIT CHIEF COMPLAINT: Low back pain significantly improved. Current Pain Level: 1-10 HISTORY OF PRESENT ILLNESS: The patient is a 81 year old right-handed lady who is status post vertebral body augmentation in the form of a balloon kyphoplasty for painful compression fracture at L3 on 06/04/2007. The patient returns today accompanied by her granddaughter who is also nurse. Clinically, the patient reports significant improvement in her pain symptoms. At the present time, she reports the pain is a 3 out of 10 only intermittent mostly with prolonged standing. This pain is relieved by sitting down. Occasionally she may also take some Vicodin. Otherwise, the patient reports feeling comfortable overall. She is able to sleep at night without any difficulty. She ambulates with a walker mostly  now because of her painful bilateral knees. She is status post previous bilateral knee arthroplasties. Patient lives independently. She has close support from her daughter and granddaughter who lives close. She needs no help with feeding herself or of taking care of her daily chores. She denies recent chills, fever or rigors. Her appetite is normal.  Her weight is steady. She denies any symptoms of a UTI, notably dysuria, hematuria or of polyuria. Her diabetes is recently well-controlled. She denies any recent changes in her breathing, or chest pains, or having difficulty with breathing at night time.  She denies any abdominal pain, constipation, diarrhea, or loose or bloody stools. Past medical history, family history as per previously from 2 weeks ago unchanged. Medications: Acetaminophen 5 mg capsules, calcium carbonate cholecalciferol, Coreg, Flexeril, furosemide, gabapentin, hydrocodone acetaminophen 5/325 mg as needed, iron polysaccharides, Cozaar, metformin, metolazone, multi vitamins, omega-3 fatty acids, Protonix, Refresh ophthalmic solution, potassium chloride, Metamucil, Xarelto, verapamil, vitamin B12. Allergies:  Amlodipine and valsartan. The patient does not smoke or drink alcohol. PHYSICAL EXAMINATION: In no acute distress. Sitting comfortably in a wheelchair. Otherwise, no gross neurological abnormalities noted. ASSESSMENT AND PLAN: The immediate post treatment radiographs following the balloon kyphoplasty were reviewed with the patient and her granddaughter. Questions were answered to their satisfaction. The patient was asked to continue to ambulate as much as possible within her means. She sees her cardiologist regularly. She was asked to maintain regular appointments with her primary care physician regarding her diabetes check up. Her most recent bone density examination was about a year ago. Questions were answered to their satisfaction. They were asked to call should they have any concerns or  questions. We will, otherwise, see her on a p.r.n. basis. The patient and her granddaughter are agreeable with this plan. Electronically Signed   By: Luanne Bras M.D.   On: 07/02/2017 00:16    My personal review of EKG: At A. fib at the rate of 78 bpm, LAD and nonspecific intraventricular conduction delay with old lateral and inferior    Assessment & Plan  1. Acute renal failure     Probably medications induced, improving 2. Diabetes mellitus 3. Systolic congestive heart failure however last echocardiogram on 2017 shows ejection fraction 77-11% with diastolic congestive heart failure grade 1 4. A. fib for cardioversion this coming Tuesday. On Xarelto and blocker  Plan  Her medications were put on hold by her primary medical doctor Dr End and her creatinine is improving we'll continue with slow hydration and monitor pulmonary edema. Hold metformin , Zaroxolyn, Lasix. Her losartan is already on hold. Check BMP in a.m.   DVT Prophylaxis Heparin   AM Labs Ordered, also please review Full Orders  Code Status full  Disposition Plan: Home  Time spent in minutes : 39 minutes   Condition GUARDED   _0 @

## 2017-07-13 NOTE — ED Notes (Signed)
The pt takes xarelto 

## 2017-07-13 NOTE — ED Provider Notes (Addendum)
[facility] DEPT Provider Note   CSN: [id number] Arrival date & time: 07/13/17  1521     History   Chief Complaint Chief Complaint  Patient presents with  . Fall  . Weakness    HPI Charlotte [name] is a 81 y.o. female.  HPI   Patient is a 81 year old female with history of A. Fib hypertension, hyperlipidemia, IBS presenting today being sent from her primary for new elevation in creatinine. Patient was discharged to hospital at the end of August. She had labs drawn one week ago. They were repeated and now she has a creatinine of 2.22. Previously 0.79. The only receent changes are that she has decreased her Lasix at the end of August. In addition patient started herself back on amoxicillin (self decided) because she was having symptoms of urinary tract infection 1 week ago.  Past Medical History:  Diagnosis Date  . Aortic insufficiency   . Atrial fibrillation ([place])    a. s/p DCCV 03/2013  . CAD (coronary artery disease)    LHC 06/2010: EF 55%, mild plaque in the LAD 20-30%, otherwise normal coronary arteries  . CATARACTS   . Chronic systolic heart failure ([place])    in setting of AFib;  Echocardiogram 02/2013: Moderate LVH, EF 25-30%, anteroseptal and apical HK, moderate AI, MAC, moderate MR, moderate LAE, mild RAE, PASP 32 => after DCCV f/u Echo 8/14: Moderate LVH, EF 60-65%, normal wall motion, grade 1 diastolic dysfunction, moderate AI, moderate LAE  . Complication of anesthesia 2009   nausea and vomitting  . Diabetes mellitus without complication ([place])   . GERD (gastroesophageal reflux disease)   . HYPERLIPIDEMIA, MILD   . HYPERTENSION, BENIGN SYSTEMIC   . Irritable bowel syndrome   . LBBB (left bundle branch block)   . MEDIAL EPICONDYLITIS   . NSVT (nonsustained ventricular tachycardia) ([place])    during Dob Echo 2011 => normal cath  . OSTEOARTHRITIS   . OSTEOPENIA   . RHINITIS, ALLERGIC   . SYNCOPE   . VITAMIN D DEFICIENCY     Patient Active Problem List   Diagnosis  Date Noted  . NICM (nonischemic cardiomyopathy) ([place]) 07/12/2017  . Low back pain without sciatica 06/05/2017  . Compression fracture of L3 lumbar vertebra ([place]) 06/02/2017  . Low back pain 06/01/2017  . Intractable low back pain 06/01/2017  . Falls 06/01/2017  . Intractable back pain 06/01/2017  . Arteriosclerosis of abdominal aorta ([place]) 05/30/2017  . Lumbosacral strain 05/29/2017  . Symptomatic anemia 05/21/2017  . Hyponatremia 05/21/2017  . Gastrointestinal hemorrhage   . Chronic constipation 05/10/2015  . Atrial fibrillation ([place]) 11/10/2014  . Severe obesity (BMI >= 40) ([place]) 06/18/2014  . Aortic regurgitation 03/03/2014  . Diabetes mellitus type 2 in obese ([place]) 12/09/2013  . Long term current use of anticoagulant therapy 05/14/2013  . VITAMIN D DEFICIENCY 10/31/2010  . VENTRICULAR TACHYCARDIA 05/31/2010  . SYNCOPE 04/24/2010  . DYSPNEA 04/24/2010  . CATARACTS 04/20/2010  . EDEMA 02/28/2010  . ELECTROCARDIOGRAM, ABNORMAL 09/19/2007  . Osteoarthritis of both ankles 01/01/2007  . Hyperlipidemia 08/13/2006  . Essential hypertension 07/16/2006  . RHINITIS, ALLERGIC 07/16/2006  . GASTROESOPHAGEAL REFLUX, NO ESOPHAGITIS 07/16/2006  . IRRITABLE BOWEL SYNDROME 07/16/2006  . OSTEOPENIA 07/16/2006    Past Surgical History:  Procedure Laterality Date  . CARDIOVERSION N/A 04/01/2013   Procedure: CARDIOVERSION;  Surgeon: Josue Hector, MD;  Location: [facility];  Service: Cardiovascular;  Laterality: N/A;  . CATARACT EXTRACTION, BILATERAL  2012   Dr. Herbert Deaner  .  COLONOSCOPY WITH PROPOFOL N/A 05/23/2017   Procedure: COLONOSCOPY WITH PROPOFOL;  Surgeon: Wonda Horner, MD;  Location: [facility] ENDOSCOPY;  Service: Endoscopy;  Laterality: N/A;  . ESOPHAGOGASTRODUODENOSCOPY (EGD) WITH PROPOFOL N/A 05/23/2017   Procedure: ESOPHAGOGASTRODUODENOSCOPY (EGD) WITH PROPOFOL;  Surgeon: Wonda Horner, MD;  Location: [facility] ENDOSCOPY;  Service: Endoscopy;  Laterality: N/A;  . IR KYPHO LUMBAR INC FX  REDUCE BONE BX UNI/BIL CANNULATION INC/IMAGING  06/06/2017  . IR RADIOLOGIST EVAL & MGMT  07/01/2017  . RADICAL HYSTERECTOMY    . REPLACEMENT TOTAL KNEE     11/03/07 right....... 03/29/08 left    OB History    No data available       Home Medications    Prior to Admission medications   Medication Sig Start Date End Date Taking? Authorizing Provider  Acetaminophen 500 MG coapsule Take 500 mg by mouth every 6 (six) hours as needed for pain.  04/08/17   [provider]  AMBULATORY NON FORMULARY MEDICATION Disp 1 walker for use as needed due to frequent falls and back pain 05/31/17   Gregor Hams, MD  Calcium Carb-Cholecalciferol (CALCIUM 600/VITAMIN D3) 600-800 MG-UNIT TABS Take 1 tablet by mouth 2 (two) times daily.    [provider]  carvedilol (COREG) 12.5 MG tablet Take 1 tablet (12.5 mg total) by mouth 2 (two) times daily. 07/12/17 10/10/17  End, Harrell Gave, MD  Cholecalciferol (VITAMIN D PO) Take 1 tablet by mouth daily.    [provider]  cyclobenzaprine (FLEXERIL) 5 MG tablet Take 1 tablet (5 mg total) by mouth 3 (three) times daily as needed for muscle spasms. 05/28/17   Noe Gens, PA-C  furosemide (LASIX) 20 MG tablet Take 0.5 tablets (10 mg total) by mouth daily. 06/07/17   Robbie Lis, MD  gabapentin (NEURONTIN) 300 MG capsule TAKE 1 CAPSULE FOUR TIMES DAILY Patient taking differently: take 1 capsule as needed 3 times a day 02/06/17   [name], MD  HYDROcodone-acetaminophen (NORCO/VICODIN) 5-325 MG tablet Take 1 tablet by mouth every 6 (six) hours as needed for severe pain. 06/07/17   Robbie Lis, MD  iron polysaccharides (NU-IRON) 150 MG capsule Take 1 capsule (150 mg total) by mouth daily. 05/23/17   Rai, Vernelle Emerald, MD  metFORMIN (GLUCOPHAGE) 500 MG tablet TAKE 1 TABLET TWICE DAILY WITH MEALS 05/14/17   [name], MD  metolazone (ZAROXOLYN) 2.5 MG tablet TAKE 1 TABLET EVERY OTHER DAY 01/28/17   Josue Hector, MD  Multiple  Vitamin (MULTIVITAMIN) capsule Take 1 capsule by mouth daily.      [provider]  Omega-3 Fatty Acids (FISH OIL PO) Take 1 tablet by mouth daily.    [provider]  oxyCODONE (OXY IR/ROXICODONE) 5 MG immediate release tablet Take 0.5-1 tablets by mouth every 6 (six) hours as needed for severe pain. 05/29/17   [provider]  pantoprazole (PROTONIX) 40 MG tablet Take 1 tablet (40 mg total) by mouth 2 (two) times daily before a meal. Please take twice a day before meals for 2 WEEKS, then daily thereafter 05/23/17   Rai, Ripudeep K, MD  polyvinyl alcohol-povidone (REFRESH) 1.4-0.6 % ophthalmic solution Place 1-2 drops into both eyes daily as needed (FOR DRY EYES).     [provider]  potassium chloride (K-DUR,KLOR-CON) 10 MEQ tablet Take 1 tablet (10 mEq total) by mouth 2 (two) times daily. 12/03/16   Josue Hector, MD  psyllium (HYDROCIL/METAMUCIL) 95 % PACK Take 1 packet by mouth  daily.    [provider]  rivaroxaban (XARELTO) 20 MG TABS tablet TAKE 20 mg TABLET EVERY DAY WITH SUPPER, start on 05/25/17 05/25/17   Rai, Vernelle Emerald, MD  verapamil (VERELAN PM) 240 MG 24 hr capsule TAKE 1 CAPSULE AT BEDTIME 03/27/17   Josue Hector, MD  vitamin B-12 (CYANOCOBALAMIN) 1000 MCG tablet Take 1,000 mcg by mouth daily.      [provider]    Family History Family History  Problem Relation Age of Onset  . Arrhythmia Mother        afib  . Alzheimer's disease Mother   . Colon cancer Father   . Heart attack Father 35  . Diabetes Sister   . Arrhythmia Sister        afib    Social History Social History  Substance Use Topics  . Smoking status: Former Smoker    Quit date: 10/08/1970  . Smokeless tobacco: Never Used  . Alcohol use No     Allergies   Amlodipine besylate-valsartan   Review of Systems Review of Systems  Constitutional: Negative for activity change.  Respiratory: Positive for shortness of breath.   Cardiovascular: Negative  for chest pain.  Gastrointestinal: Negative for abdominal pain.     Physical Exam Updated Vital Signs BP 119/72   Pulse 80   Temp 98 F (36.7 C) (Oral)   Resp 17   Ht 5' 2.5" (1.588 m)   Wt 111.1 kg (245 lb)   SpO2 97%   BMI 44.10 kg/m   Physical Exam  Constitutional: She is oriented to person, place, and time. She appears well-developed and well-nourished.  Mild obeity  HENT:  Head: Normocephalic and atraumatic.  Dry mucus membranes  Eyes: Right eye exhibits no discharge. Left eye exhibits no discharge.  Cardiovascular: Normal rate, regular rhythm and normal heart sounds.  No murmur heard. Pulmonary/Chest: Effort normal and breath sounds normal. She has no wheezes. She has no rales.  Abdominal: Soft. She exhibits no distension. There is no tenderness.  Neurological: She is oriented to person, place, and time.  Skin: Skin is warm and dry. She is not diaphoretic.  Psychiatric: She has a normal mood and affect.  Nursing note and vitals reviewed.    ED Treatments / Results  Labs (all labs ordered are listed, but only abnormal results are displayed) Labs Reviewed  BASIC METABOLIC PANEL - Abnormal; Notable for the following:       Result Value   Sodium 132 (*)    Potassium 3.4 (*)    Chloride 98 (*)    CO2 20 (*)    Glucose, Bld 144 (*)    BUN 58 (*)    Creatinine, Ser 1.92 (*)    GFR calc non Af Amer 24 (*)    GFR calc Af Amer 27 (*)    All other components within normal limits  CBC - Abnormal; Notable for the following:    Hemoglobin 10.7 (*)    HCT 32.7 (*)    RDW 21.5 (*)    All other components within normal limits  TROPONIN I  URINALYSIS, ROUTINE W REFLEX MICROSCOPIC  BRAIN NATRIURETIC PEPTIDE    EKG  EKG Interpretation  Date/Time:  Saturday July 13 2017 15:45:11 EDT Ventricular Rate:  78 PR Interval:    QRS Duration: 148 QT Interval:  404 QTC Calculation: 460 R Axis:   -62 Text Interpretation:  Atrial fibrillation Atrial fibrillation with  lbbb noted  Confirmed by Marifer Hurd [phone]) on  07/13/2017 4:58:33 PM       Radiology Dg Chest 2 View  Result Date: 07/13/2017 CLINICAL DATA:  Recent falls.  Weakness. EXAM: CHEST  2 VIEW COMPARISON:  05/31/2010 FINDINGS: The heart size and mediastinal contours are within normal limits. Both lungs are clear. No pleural effusion or pneumothorax. The visualized skeletal structures are unremarkable. IMPRESSION: No active cardiopulmonary disease. Electronically Signed   By: Lajean Manes M.D.   On: 07/13/2017 16:26    Procedures Procedures (including critical care time)  Medications Ordered in ED Medications  sodium chloride 0.9 % bolus 500 mL (not administered)     Initial Impression / Assessment and Plan / ED Course  I have reviewed the triage vital signs and the nursing notes.  Pertinent labs & imaging results that were available during my care of the patient were reviewed by me and considered in my medical decision making (see chart for details).    Patient is a 81 year old female with history of A. Fib hypertension, hyperlipidemia, IBS presenting today being sent from her primary for new elevation in creatinine. Patient was discharged to hospital at the end of August. She had labs drawn one week ago. They were repeated and now she has a creatinine of 2.22. Previously 0.79. The only receent changes are that she has decreased her Lasix at the end of August. In addition patient started herself back on amoxicillin (self decided) because she was having symptoms of urinary tract infection 1 week ago.  4:18 PM Will admit for fluids, trending Cr.   Final Clinical Impressions(s) / ED Diagnoses   Final diagnoses:  None    New Prescriptions New Prescriptions   No medications on file     Macarthur Critchley, MD 07/14/17 1618    Macarthur Critchley, MD 08/13/17 2331    Macarthur Critchley, MD 09/24/17 1128

## 2017-07-13 NOTE — Telephone Encounter (Signed)
BMP drawn yesterday is notable for AKI with creatinine of 2.2 (up from 0.7 on 9/27). I have spoken with the patient, who reports that she feels a little better compared with yesterday, when losartan was held and carvedilol increased to 12.5 mg BID. In light of her AKI, I have instructed Ms. Cassidy to hold furosemide, metolazone, potassium chloride, and metformin. I instructed her to increase her fluid intake. We will have her come in on Monday for a STAT BMP to ensure that her renal function is improving. If she has any worsening symptoms, including shortness of breath, fatigue, and lightheadedness, I advised Ms. Felmlee to be taken to the nearest ED or call 911. She is in agreement with this plan.  Yvonne Kendall, MD [facility] Pager: [phone]

## 2017-07-13 NOTE — Telephone Encounter (Signed)
I received a call back from Ms. Grandfield's daughter, [name]. She reports that her mother lost her balance and fell this morning. She was not able to get up on her on and EMS is currently there assisting her. In light of her a-fib, borderline low blood pressure, and AKI, I have advised Ms. Shoff's daughter that the patient should be taken to the emergency department for further evaluation. I think it is no longer safe to manage her symptoms at home. She voiced understanding and will speak with her mother and EMS.  Yvonne Kendall, MD [facility] Pager: [phone]

## 2017-07-13 NOTE — ED Notes (Signed)
Consulting MD at beside

## 2017-07-14 DIAGNOSIS — N179 Acute kidney failure, unspecified: Principal | ICD-10-CM

## 2017-07-14 LAB — BASIC METABOLIC PANEL
Anion gap: 12 (ref 5–15)
BUN: 52 mg/dL — AB (ref 6–20)
CHLORIDE: 100 mmol/L — AB (ref 101–111)
CO2: 23 mmol/L (ref 22–32)
CREATININE: 1.34 mg/dL — AB (ref 0.44–1.00)
Calcium: 8.7 mg/dL — ABNORMAL LOW (ref 8.9–10.3)
GFR calc Af Amer: 42 mL/min — ABNORMAL LOW (ref >60)
GFR, EST NON AFRICAN AMERICAN: 37 mL/min — AB (ref >60)
GLUCOSE: 118 mg/dL — AB (ref 65–99)
Potassium: 3.7 mmol/L (ref 3.5–5.1)
SODIUM: 135 mmol/L (ref 135–145)

## 2017-07-14 LAB — GLUCOSE, CAPILLARY
GLUCOSE-CAPILLARY: 114 mg/dL — AB (ref 65–99)
Glucose-Capillary: 122 mg/dL — ABNORMAL HIGH (ref 65–99)

## 2017-07-14 MED ORDER — FUROSEMIDE 20 MG PO TABS: 20.0000 mg | ORAL_TABLET | Freq: Every day | ORAL | 0 refills | Status: DC

## 2017-07-14 NOTE — Progress Notes (Signed)
Nsg Discharge Note  Admit Date:  07/13/2017 Discharge date: 07/14/2017   Charlotte [name] to be D/C'd Home per MD order.  AVS completed.  Copy for chart, and copy for patient signed, and dated. Patient/caregiver able to verbalize understanding.  Discharge Medication: Allergies as of 07/14/2017      Reactions   Amlodipine Besylate-valsartan Other (See Comments)   headache      Medication List    STOP taking these medications   amoxicillin 500 MG capsule Commonly known as:  AMOXIL   metFORMIN 500 MG tablet Commonly known as:  GLUCOPHAGE   metolazone 2.5 MG tablet Commonly known as:  ZAROXOLYN   oxyCODONE 5 MG immediate release tablet Commonly known as:  Oxy IR/ROXICODONE     TAKE these medications   Acetaminophen 500 MG coapsule Take 500 mg by mouth every 6 (six) hours as needed for pain.   AMBULATORY NON FORMULARY MEDICATION Disp 1 walker for use as needed due to frequent falls and back pain   CALCIUM 600/VITAMIN D3 600-800 MG-UNIT Tabs Generic drug:  Calcium Carb-Cholecalciferol Take 1 tablet by mouth 2 (two) times daily.   carvedilol 12.5 MG tablet Commonly known as:  COREG Take 1 tablet (12.5 mg total) by mouth 2 (two) times daily.   cyclobenzaprine 5 MG tablet Commonly known as:  FLEXERIL Take 1 tablet (5 mg total) by mouth 3 (three) times daily as needed for muscle spasms.   FISH OIL PO Take 1 capsule by mouth daily.   furosemide 20 MG tablet Commonly known as:  LASIX Take 1 tablet (20 mg total) by mouth daily. What changed:  how much to take   gabapentin 300 MG capsule Commonly known as:  NEURONTIN TAKE 1 CAPSULE FOUR TIMES DAILY What changed:  See the new instructions.   HYDROcodone-acetaminophen 5-325 MG tablet Commonly known as:  NORCO/VICODIN Take 1 tablet by mouth every 6 (six) hours as needed for severe pain.   iron polysaccharides 150 MG capsule Commonly known as:  NU-IRON Take 1 capsule (150 mg total) by mouth daily.   METAMUCIL PO Take 5  mLs by mouth daily as needed (constipation). Mix in 8 oz liquid and drink   multivitamin with minerals Tabs tablet Take 1 tablet by mouth daily.   pantoprazole 40 MG tablet Commonly known as:  PROTONIX Take 1 tablet (40 mg total) by mouth 2 (two) times daily before a meal. Please take twice a day before meals for 2 WEEKS, then daily thereafter What changed:  when to take this  additional instructions   potassium chloride 10 MEQ tablet Commonly known as:  K-DUR,KLOR-CON Take 1 tablet (10 mEq total) by mouth 2 (two) times daily.   REFRESH 1.4-0.6 % ophthalmic solution Generic drug:  polyvinyl alcohol-povidone Place 1-2 drops into both eyes daily as needed (dry eyes).   rivaroxaban 20 MG Tabs tablet Commonly known as:  XARELTO TAKE 20 mg TABLET EVERY DAY WITH SUPPER, start on 05/25/17 What changed:  how much to take  how to take this  when to take this  additional instructions   verapamil 240 MG 24 hr capsule Commonly known as:  VERELAN PM TAKE 1 CAPSULE AT BEDTIME What changed:  See the new instructions.   vitamin B-12 1000 MCG tablet Commonly known as:  CYANOCOBALAMIN Take 1,000 mcg by mouth daily.   VITAMIN D PO Take 1 tablet by mouth daily.       Discharge Assessment: Vitals:   07/14/17 0427 07/14/17 0854  BP: (!) 113/53 Marland Kitchen)  130/58  Pulse: 63 76  Resp: 16   Temp: 97.9 F (36.6 C)   SpO2: 98%    Skin clean, dry and intact without evidence of skin break down, no evidence of skin tears noted. IV catheter discontinued intact. Site without signs and symptoms of complications - no redness or edema noted at insertion site, patient denies c/o pain - only slight tenderness at site.  Dressing with slight pressure applied.  D/c Instructions-Education: Discharge instructions given to patient/family with verbalized understanding. D/c education completed with patient/family including follow up instructions, medication list, d/c activities limitations if indicated,  with other d/c instructions as indicated by MD - patient able to verbalize understanding, all questions fully answered. Patient instructed to return to ED, call 911, or call MD for any changes in condition.    Waiting on daughter to pick patient up. Lorene Dy, RN 07/14/2017 2:14 PM

## 2017-07-14 NOTE — Care Management Obs Status (Signed)
MEDICARE OBSERVATION STATUS NOTIFICATION   Patient Details  Name: Charlotte [name] MRN: [id number] Date of Birth: 1936-08-29   Medicare Observation Status Notification Given:  Yes    [name], Derrill Memo, RN 07/14/2017, 1:05 PM

## 2017-07-14 NOTE — Care Management CC44 (Signed)
Condition Code 44 Documentation Completed  Patient Details  Name: ALEGNA VANDERKOLK MRN: [id number] Date of Birth: Feb 27, 1936   Condition Code 44 given:  Yes Patient signature on Condition Code 44 notice:  Yes Documentation of 2 MD's agreement:  Yes Code 44 added to claim:  Yes    Johnell Landowski, Derrill Memo, RN 07/14/2017, 1:05 PM

## 2017-07-14 NOTE — Care Management Note (Signed)
Case Management Note  Patient Details  Name: Charlotte [name] MRN: [id number] Date of Birth: [birth year]  Subjective/Objective:  81 y.o. F to be discharged with HHPT/OT and will be resumed with [facility]. No DME needs.                   Action/Plan:CM will sign off for now but will be available should additional discharge needs arise or disposition change.    Expected Discharge Date:  07/14/17               Expected Discharge Plan:  Home w Home Health Services  In-House Referral:  NA  Discharge planning Services  CM Consult  Post Acute Care Choice:  Home Health Choice offered to:  Patient  DME Arranged:  N/A (has all DME) DME Agency:  NA  HH Arranged:  PT, OT HH Agency:  [facility]  Status of Service:  Completed, signed off  If discussed at Long Length of Stay Meetings, dates discussed:    Additional Comments:  Yvone Neu, RN 07/14/2017, 1:12 PM

## 2017-07-14 NOTE — Discharge Summary (Signed)
Physician Discharge Summary  Charlotte [name] BJY:[id number] DOB: 01-Oct-1936 DOA: 07/13/2017  PCP: Agapito Games, MD  Admit date: 07/13/2017 Discharge date: 07/14/2017  Time spent: 35 minutes  Recommendations for Outpatient Follow-up:  1. Cardiology on 10/9 for Cardioversion   Discharge Diagnoses:  Active Problems:   Acute renal failure (ARF) (HCC)   Chronic systolic CHF   P,AFib   DM 2   CAD  Discharge Condition: stable  Diet recommendation: DM/heart healthy  Filed Weights   07/13/17 1547  Weight: 111.1 kg (245 lb)    History of present illness:  Charlotte [name]  is a 81 y.o. female, With past medical history significant for congestive heart failure , hypertension and atrial fib who was sent today to the emergency room from her doctor's office to be evaluated for acute renal failure. Patient was on losartan, Zaroxolyn and Lasix in addition to metformin which were put on hold by Dr. Okey Dupre and patient was sent here for further evaluation  Hospital Course:  1. Acute renal failure -Probably medications induced, improving with hydration -ARB stopped prior to admission -improved with hydration to 1.3 -lasix 20mg  daily resumed at discharge -HHPT set up at discharge  2. Borderline Diabetes mellitus -stopped due to AKI and side effects  3. chronic Systolic congestive heart failure - however last echocardiogram on 2017 shows ejection fraction 55-60% with diastolic congestive heart failure grade 1 -dry on admission, s/p IVF, lasix resumed at discharge -ARB stopped and Zaroxolyn every other day stopped  4. A. fib for cardioversion this coming Tuesday. On Xarelto and blocker -stable, continued on xarelto and BB -d/w Dr.Nishan today, he recommended FU in office Tuesday for DCCV -they have labs drawn Tuesday am before DCCV  Rest of chronic issues stable  Consultations:  D/w Cards Dr.Nishan  Discharge Exam: Vitals:   07/14/17 0427 07/14/17 0854  BP: (!) 113/53 (!) 130/58   Pulse: 63 76  Resp: 16   Temp: 97.9 F (36.6 C)   SpO2: 98%     General: AAOx3 Cardiovascular: S1S2/RRR Respiratory: CTAB  Discharge Instructions   Discharge Instructions    Diet - low sodium heart healthy    Complete by:  As directed    Increase activity slowly    Complete by:  As directed      Current Discharge Medication List    CONTINUE these medications which have CHANGED   Details  furosemide (LASIX) 20 MG tablet Take 1 tablet (20 mg total) by mouth daily. Qty: 1 tablet, Refills: 0      CONTINUE these medications which have NOT CHANGED   Details  Acetaminophen 500 MG coapsule Take 500 mg by mouth every 6 (six) hours as needed for pain.     Calcium Carb-Cholecalciferol (CALCIUM 600/VITAMIN D3) 600-800 MG-UNIT TABS Take 1 tablet by mouth 2 (two) times daily.    carvedilol (COREG) 12.5 MG tablet Take 1 tablet (12.5 mg total) by mouth 2 (two) times daily. Qty: 180 tablet, Refills: 1   Associated Diagnoses: Paroxysmal atrial fibrillation (HCC)    Cholecalciferol (VITAMIN D PO) Take 1 tablet by mouth daily.    cyclobenzaprine (FLEXERIL) 5 MG tablet Take 1 tablet (5 mg total) by mouth 3 (three) times daily as needed for muscle spasms. Qty: 15 tablet, Refills: 0    gabapentin (NEURONTIN) 300 MG capsule TAKE 1 CAPSULE FOUR TIMES DAILY Qty: 360 capsule, Refills: 1    iron polysaccharides (NU-IRON) 150 MG capsule Take 1 capsule (150 mg total) by mouth daily. Qty: 30  capsule, Refills: 3   Associated Diagnoses: Osteoarthritis of both ankles    Multiple Vitamin (MULTIVITAMIN WITH MINERALS) TABS tablet Take 1 tablet by mouth daily.    Omega-3 Fatty Acids (FISH OIL PO) Take 1 capsule by mouth daily.     pantoprazole (PROTONIX) 40 MG tablet Take 1 tablet (40 mg total) by mouth 2 (two) times daily before a meal. Please take twice a day before meals for 2 WEEKS, then daily thereafter Qty: 60 tablet, Refills: 4    polyvinyl alcohol-povidone (REFRESH) 1.4-0.6 % ophthalmic  solution Place 1-2 drops into both eyes daily as needed (dry eyes).     Psyllium (METAMUCIL PO) Take 5 mLs by mouth daily as needed (constipation). Mix in 8 oz liquid and drink    rivaroxaban (XARELTO) 20 MG TABS tablet TAKE 20 mg TABLET EVERY DAY WITH SUPPER, start on 05/25/17 Qty: 90 tablet, Refills: 3    verapamil (VERELAN PM) 240 MG 24 hr capsule TAKE 1 CAPSULE AT BEDTIME Qty: 90 capsule, Refills: 2    vitamin B-12 (CYANOCOBALAMIN) 1000 MCG tablet Take 1,000 mcg by mouth daily.      AMBULATORY NON FORMULARY MEDICATION Disp 1 walker for use as needed due to frequent falls and back pain Qty: 1 each, Refills: 0    HYDROcodone-acetaminophen (NORCO/VICODIN) 5-325 MG tablet Take 1 tablet by mouth every 6 (six) hours as needed for severe pain. Qty: 30 tablet, Refills: 0    potassium chloride (K-DUR,KLOR-CON) 10 MEQ tablet Take 1 tablet (10 mEq total) by mouth 2 (two) times daily. Qty: 180 tablet, Refills: 3      STOP taking these medications     amoxicillin (AMOXIL) 500 MG capsule      oxyCODONE (OXY IR/ROXICODONE) 5 MG immediate release tablet      metFORMIN (GLUCOPHAGE) 500 MG tablet      metolazone (ZAROXOLYN) 2.5 MG tablet        Allergies  Allergen Reactions  . Amlodipine Besylate-Valsartan Other (See Comments)    headache      The results of significant diagnostics from this hospitalization (including imaging, microbiology, ancillary and laboratory) are listed below for reference.    Significant Diagnostic Studies: Dg Chest 2 View  Result Date: 07/13/2017 CLINICAL DATA:  Recent falls.  Weakness. EXAM: CHEST  2 VIEW COMPARISON:  05/31/2010 FINDINGS: The heart size and mediastinal contours are within normal limits. Both lungs are clear. No pleural effusion or pneumothorax. The visualized skeletal structures are unremarkable. IMPRESSION: No active cardiopulmonary disease. Electronically Signed   By: Amie Portland M.D.   On: 07/13/2017 16:26   Ct Head Wo  Contrast  Result Date: 07/13/2017 CLINICAL DATA:  Status post fall, with generalized weakness. Patient on blood thinner. Initial encounter. EXAM: CT HEAD WITHOUT CONTRAST TECHNIQUE: Contiguous axial images were obtained from the base of the skull through the vertex without intravenous contrast. COMPARISON:  None. FINDINGS: Brain: No evidence of acute infarction, hemorrhage, hydrocephalus or extra-axial collection. Prominence of the ventricles and sulci reflects mild to moderate cortical volume loss. Mild subcortical and periventricular matter change likely reflects small vessel ischemic microangiopathy. The brainstem and fourth ventricle are within normal limits. The basal ganglia are unremarkable in appearance. The cerebral hemispheres demonstrate grossly normal gray-white differentiation. No mass effect or midline shift is seen. Vascular: No hyperdense vessel or unexpected calcification. Skull: There is no evidence of fracture; a diffusely calcified 2.4 cm mass is noted medial to the right temporal lobe, likely reflecting a calcified meningioma. Sinuses/Orbits: The orbits are  within normal limits. The paranasal sinuses and mastoid air cells are well-aerated. Other: No significant soft tissue abnormalities are seen. IMPRESSION: 1. No evidence of traumatic intracranial injury or fracture. 2. Mild to moderate cortical volume loss and scattered small vessel ischemic microangiopathy. 3. Diffusely calcified 2.4 cm mass medial to the right temporal lobe, likely reflecting a calcified meningioma. Electronically Signed   By: Roanna Raider M.D.   On: 07/13/2017 18:51   Ir Radiologist Eval & Mgmt  Result Date: 07/02/2017 EXAM: ESTABLISHED PATIENT OFFICE VISIT CHIEF COMPLAINT: Low back pain significantly improved. Current Pain Level: 1-10 HISTORY OF PRESENT ILLNESS: The patient is a 80 year old right-handed lady who is status post vertebral body augmentation in the form of a balloon kyphoplasty for painful compression  fracture at L3 on 06/04/2007. The patient returns today accompanied by her granddaughter who is also nurse. Clinically, the patient reports significant improvement in her pain symptoms. At the present time, she reports the pain is a 3 out of 10 only intermittent mostly with prolonged standing. This pain is relieved by sitting down. Occasionally she may also take some Vicodin. Otherwise, the patient reports feeling comfortable overall. She is able to sleep at night without any difficulty. She ambulates with a walker mostly now because of her painful bilateral knees. She is status post previous bilateral knee arthroplasties. Patient lives independently. She has close support from her daughter and granddaughter who lives close. She needs no help with feeding herself or of taking care of her daily chores. She denies recent chills, fever or rigors. Her appetite is normal.  Her weight is steady. She denies any symptoms of a UTI, notably dysuria, hematuria or of polyuria. Her diabetes is recently well-controlled. She denies any recent changes in her breathing, or chest pains, or having difficulty with breathing at night time. She denies any abdominal pain, constipation, diarrhea, or loose or bloody stools. Past medical history, family history as per previously from 2 weeks ago unchanged. Medications: Acetaminophen 5 mg capsules, calcium carbonate cholecalciferol, Coreg, Flexeril, furosemide, gabapentin, hydrocodone acetaminophen 5/325 mg as needed, iron polysaccharides, Cozaar, metformin, metolazone, multi vitamins, omega-3 fatty acids, Protonix, Refresh ophthalmic solution, potassium chloride, Metamucil, Xarelto, verapamil, vitamin B12. Allergies:  Amlodipine and valsartan. The patient does not smoke or drink alcohol. PHYSICAL EXAMINATION: In no acute distress. Sitting comfortably in a wheelchair. Otherwise, no gross neurological abnormalities noted. ASSESSMENT AND PLAN: The immediate post treatment radiographs following  the balloon kyphoplasty were reviewed with the patient and her granddaughter. Questions were answered to their satisfaction. The patient was asked to continue to ambulate as much as possible within her means. She sees her cardiologist regularly. She was asked to maintain regular appointments with her primary care physician regarding her diabetes check up. Her most recent bone density examination was about a year ago. Questions were answered to their satisfaction. They were asked to call should they have any concerns or questions. We will, otherwise, see her on a p.r.n. basis. The patient and her granddaughter are agreeable with this plan. Electronically Signed   By: Julieanne Cotton M.D.   On: 07/02/2017 00:16    Microbiology: No results found for this or any previous visit (from the past 240 hour(s)).   Labs: Basic Metabolic Panel:  Recent Labs Lab 07/12/17 1506 07/13/17 1557 07/14/17 0405  NA 137 132* 135  K 4.2 3.4* 3.7  CL 98 98* 100*  CO2 19* 20* 23  GLUCOSE 94 144* 118*  BUN 57* 58* 52*  CREATININE 2.22* 1.92*  1.34*  CALCIUM 9.7 9.4 8.7*  MG 1.9  --   --    Liver Function Tests: No results for input(s): AST, ALT, ALKPHOS, BILITOT, PROT, ALBUMIN in the last 168 hours. No results for input(s): LIPASE, AMYLASE in the last 168 hours. No results for input(s): AMMONIA in the last 168 hours. CBC:  Recent Labs Lab 07/12/17 1506 07/13/17 1557  WBC 8.3 7.7  NEUTROABS 5.0  --   HGB 11.6 10.7*  HCT 35.3 32.7*  MCV 79 80.5  PLT 323 281   Cardiac Enzymes:  Recent Labs Lab 07/13/17 1557  TROPONINI <0.03   BNP: BNP (last 3 results)  Recent Labs  07/13/17 1557  BNP 364.2*    ProBNP (last 3 results)  Recent Labs  05/21/17 1510  PROBNP 332    CBG:  Recent Labs Lab 07/13/17 2122 07/14/17 0701 07/14/17 0739  GLUCAP 107* 114* 122*       Signed:  Samvel Zinn MD.  [facility] 07/14/2017, 12:51 PM

## 2017-07-14 NOTE — Evaluation (Signed)
Physical Therapy Evaluation and Discharge Patient Details Name: Charlotte [name] MRN: [id number] DOB: [birth year] Today's Date: 07/14/2017   History of Present Illness    Charlotte [name]  is a 81 y.o. female, With past medical history significant for congestive heart failure , hypertension and atrial fib who was sent today to the emergency room from her doctor's office to be evaluated for acute renal failure. Patient was on losartan, Zaroxolyn and Lasix in addition to metformin which were put on hold by Dr. Saunders Revel and patient was sent here for further evaluation. Patient denies any chest pains fever chills nausea vomiting or diarrhea. On her way to the hospital she had a full with head trauma however CT of the head was negative for acute events.   Clinical Impression  Pt presents at/near baseline functional level.  She is RW dependent, able to mobilize short duration without physical assistance and feels confident returning home today.  She has HHPT, and services should be resumed upon return home.  No equipment needs, no further acute PT.      Follow Up Recommendations Home health PT    Equipment Recommendations  None recommended by PT    Recommendations for Other Services       Precautions / Restrictions Precautions Precautions: Fall Precaution Comments: up with supervision, use RW Restrictions Weight Bearing Restrictions: No      Mobility  Bed Mobility Overal bed mobility:  (unobserved, pt up in chair)                Transfers Overall transfer level: Modified independent Equipment used: Rolling walker (2 wheeled)             General transfer comment: uses armrest appropriately, able to stand and sit with control when cued.  Discussed technique to assimilate with daily routine (strong up/slow down)  Ambulation/Gait Ambulation/Gait assistance: Modified independent (Device/Increase time) Ambulation Distance (Feet): 150 Feet Assistive device: Rolling walker (2 wheeled) Gait  Pattern/deviations: Step-through pattern   Gait velocity interpretation: Below normal speed for age/gender General Gait Details: slow but steady, feels leg fatigue at 75-80 feet, able to continue and return to room without incident.  Discussed integrating walking multiple short bouts into daily routine  Stairs            Wheelchair Mobility    Modified Rankin (Stroke Patients Only)       Balance Overall balance assessment: No apparent balance deficits (not formally assessed)                                           Pertinent Vitals/Pain Pain Assessment: No/denies pain    Home Living Family/patient expects to be discharged to:: Private residence Living Arrangements: Alone Available Help at Discharge: Family Type of Home: House Home Access: Ramped entrance     Home Layout: One Falcon: Environmental consultant - 4 wheels;Walker - 2 wheels;Cane - single point;Toilet riser Additional Comments: daughter lives next door    Prior Function Level of Independence: Independent with assistive device(s)         Comments: receives HHPT 3x/week; uses RW in home, has had 3 falls in 6 weeks, attributes to postop back surgery weakness for 2 falls, and recent fall likely due to dehydration.       Hand Dominance        Extremity/Trunk Assessment   Upper Extremity Assessment Upper Extremity Assessment: Overall  WFL for tasks assessed    Lower Extremity Assessment Lower Extremity Assessment: Overall WFL for tasks assessed;Generalized weakness    Cervical / Trunk Assessment Cervical / Trunk Assessment: Normal  Communication   Communication: No difficulties  Cognition Arousal/Alertness: Awake/alert Behavior During Therapy: WFL for tasks assessed/performed Overall Cognitive Status: Within Functional Limits for tasks assessed                                        General Comments      Exercises     Assessment/Plan    PT Assessment  All further PT needs can be met in the next venue of care  PT Problem List Cardiopulmonary status limiting activity;Decreased activity tolerance;Decreased strength       PT Treatment Interventions      PT Goals (Current goals can be found in the Care Plan section)  Acute Rehab PT Goals Patient Stated Goal: go home; feel better after scheduled cardioversion PT Goal Formulation: All assessment and education complete, DC therapy    Frequency     Barriers to discharge        Co-evaluation               AM-PAC PT "6 Clicks" Daily Activity  Outcome Measure Difficulty turning over in bed (including adjusting bedclothes, sheets and blankets)?: A Little Difficulty moving from lying on back to sitting on the side of the bed? : A Little Difficulty sitting down on and standing up from a chair with arms (e.g., wheelchair, bedside commode, etc,.)?: A Little Help needed moving to and from a bed to chair (including a wheelchair)?: None Help needed walking in hospital room?: None Help needed climbing 3-5 steps with a railing? : A Little 6 Click Score: 20    End of Session   Activity Tolerance: Patient tolerated treatment well Patient left: in chair;with call bell/phone within reach;with family/visitor present Nurse Communication: Mobility status PT Visit Diagnosis: Repeated falls (R29.6);Muscle weakness (generalized) (M62.81);Difficulty in walking, not elsewhere classified (R26.2)    Time: 3833-3832 PT Time Calculation (min) (ACUTE ONLY): 25 min   Charges:   PT Evaluation $PT Eval Moderate Complexity: 1 Mod     PT G Codes:        Kearney Hard, PT, DPT, MS Board Certified Geriatric Clinical Specialist  Herbie Drape 07/14/2017, 10:29 AM

## 2017-07-15 ENCOUNTER — Encounter: Payer: Self-pay | Admitting: *Deleted

## 2017-07-15 ENCOUNTER — Telehealth: Payer: Self-pay | Admitting: Cardiovascular Disease

## 2017-07-15 NOTE — Telephone Encounter (Signed)
I spoke with Dr End--pt does not need repeat BMET today. Pt's daughter aware.

## 2017-07-15 NOTE — Telephone Encounter (Signed)
I spoke with patient's daughter, [name]. Pt was hospitalized 07/13/17-07/14/17 because of fall, BMET done 07/14/17. [name] asking if pt still needs to come to our office for BMET today. [name] advised I will forward to Dr End for review.

## 2017-07-15 NOTE — Telephone Encounter (Signed)
Pt returned office call please give her a call back.

## 2017-07-15 NOTE — Telephone Encounter (Signed)
See phone note 07/13/17

## 2017-07-16 ENCOUNTER — Other Ambulatory Visit: Payer: Self-pay

## 2017-07-16 ENCOUNTER — Ambulatory Visit (HOSPITAL_COMMUNITY): Payer: Medicare HMO | Admitting: Anesthesiology

## 2017-07-16 ENCOUNTER — Encounter (HOSPITAL_COMMUNITY): Payer: Self-pay | Admitting: *Deleted

## 2017-07-16 ENCOUNTER — Encounter (HOSPITAL_COMMUNITY)
Admission: RE | Disposition: A | Discharge status: Home / Self Care | Payer: Self-pay | Source: Ambulatory Visit | Attending: Internal Medicine

## 2017-07-16 ENCOUNTER — Ambulatory Visit (HOSPITAL_COMMUNITY)
Admission: RE | Admit: 2017-07-16 | Discharge: 2017-07-16 | Disposition: A | Discharge status: Home / Self Care | Payer: Medicare HMO | Source: Ambulatory Visit | Attending: Internal Medicine | Admitting: Internal Medicine

## 2017-07-16 DIAGNOSIS — K219 Gastro-esophageal reflux disease without esophagitis: Secondary | ICD-10-CM | POA: Diagnosis not present

## 2017-07-16 DIAGNOSIS — I351 Nonrheumatic aortic (valve) insufficiency: Secondary | ICD-10-CM | POA: Diagnosis not present

## 2017-07-16 DIAGNOSIS — I480 Paroxysmal atrial fibrillation: Secondary | ICD-10-CM | POA: Insufficient documentation

## 2017-07-16 DIAGNOSIS — E785 Hyperlipidemia, unspecified: Secondary | ICD-10-CM | POA: Insufficient documentation

## 2017-07-16 DIAGNOSIS — I5022 Chronic systolic (congestive) heart failure: Secondary | ICD-10-CM | POA: Insufficient documentation

## 2017-07-16 DIAGNOSIS — Z87891 Personal history of nicotine dependence: Secondary | ICD-10-CM | POA: Insufficient documentation

## 2017-07-16 DIAGNOSIS — I350 Nonrheumatic aortic (valve) stenosis: Secondary | ICD-10-CM | POA: Diagnosis not present

## 2017-07-16 DIAGNOSIS — Z888 Allergy status to other drugs, medicaments and biological substances status: Secondary | ICD-10-CM | POA: Diagnosis not present

## 2017-07-16 DIAGNOSIS — I481 Persistent atrial fibrillation: Secondary | ICD-10-CM

## 2017-07-16 DIAGNOSIS — M8580 Other specified disorders of bone density and structure, unspecified site: Secondary | ICD-10-CM | POA: Insufficient documentation

## 2017-07-16 DIAGNOSIS — I10 Essential (primary) hypertension: Secondary | ICD-10-CM | POA: Diagnosis not present

## 2017-07-16 DIAGNOSIS — E559 Vitamin D deficiency, unspecified: Secondary | ICD-10-CM | POA: Diagnosis not present

## 2017-07-16 DIAGNOSIS — E119 Type 2 diabetes mellitus without complications: Secondary | ICD-10-CM | POA: Insufficient documentation

## 2017-07-16 DIAGNOSIS — I2510 Atherosclerotic heart disease of native coronary artery without angina pectoris: Secondary | ICD-10-CM | POA: Insufficient documentation

## 2017-07-16 DIAGNOSIS — I110 Hypertensive heart disease with heart failure: Secondary | ICD-10-CM | POA: Diagnosis not present

## 2017-07-16 DIAGNOSIS — Z79899 Other long term (current) drug therapy: Secondary | ICD-10-CM | POA: Insufficient documentation

## 2017-07-16 DIAGNOSIS — I447 Left bundle-branch block, unspecified: Secondary | ICD-10-CM | POA: Insufficient documentation

## 2017-07-16 DIAGNOSIS — I4891 Unspecified atrial fibrillation: Secondary | ICD-10-CM | POA: Diagnosis not present

## 2017-07-16 DIAGNOSIS — M1990 Unspecified osteoarthritis, unspecified site: Secondary | ICD-10-CM | POA: Insufficient documentation

## 2017-07-16 HISTORY — DX: Nausea with vomiting, unspecified: R11.2

## 2017-07-16 HISTORY — DX: Prediabetes: R73.03

## 2017-07-16 HISTORY — PX: CARDIOVERSION: SHX1299

## 2017-07-16 HISTORY — DX: Other specified postprocedural states: Z98.890

## 2017-07-16 SURGERY — CARDIOVERSION
Anesthesia: General

## 2017-07-16 MED ORDER — LIDOCAINE 2% (20 MG/ML) 5 ML SYRINGE
INTRAMUSCULAR | Status: DC | PRN
  Administered 2017-07-16: 100 mg via INTRAVENOUS

## 2017-07-16 MED ORDER — SODIUM CHLORIDE 0.9 % IV SOLN
INTRAVENOUS | Status: DC
  Administered 2017-07-16: 14:00:00 via INTRAVENOUS

## 2017-07-16 MED ORDER — PROPOFOL 10 MG/ML IV BOLUS
INTRAVENOUS | Status: DC | PRN
  Administered 2017-07-16: 60 mg via INTRAVENOUS
  Administered 2017-07-16: 10 mg via INTRAVENOUS

## 2017-07-16 NOTE — H&P (Signed)
   INTERVAL PROCEDURE H&P  History and Physical Interval Note:  07/16/2017 1:05 PM  Charlotte [name] has presented today for their planned procedure. The various methods of treatment have been discussed with the patient and family. After consideration of risks, benefits and other options for treatment, the patient has consented to the procedure.  The patients' outpatient history has been reviewed, patient examined, and no change in status from most recent office note within the past 30 days. I have reviewed the patients' chart and labs and will proceed as planned. Questions were answered to the patient's satisfaction.   Chrystie Nose, MD, [facility]  [place]  [facility]  Attending Cardiologist  Direct Dial: [phone]  Fax: [phone]  Website:  [email or web address]  Lisette Abu Hilty 07/16/2017, 1:05 PM

## 2017-07-16 NOTE — Anesthesia Preprocedure Evaluation (Signed)
Anesthesia Evaluation  Patient identified by MRN, date of birth, ID band Patient awake    Reviewed: Allergy & Precautions, H&P , Patient's Chart, lab work & pertinent test results, reviewed documented beta blocker date and time   Airway Mallampati: II  TM Distance: >3 FB Neck ROM: full    Dental no notable dental hx.    Pulmonary former smoker,    Pulmonary exam normal breath sounds clear to auscultation       Cardiovascular hypertension,  Rhythm:regular Rate:Normal     Neuro/Psych    GI/Hepatic   Endo/Other    Renal/GU      Musculoskeletal   Abdominal   Peds  Hematology   Anesthesia Other Findings The estimated ejection fraction was in the range of 55% to 60%. Wall motion was normal; there were no regional wall motion abnormalities  Reproductive/Obstetrics                             Anesthesia Physical Anesthesia Plan  ASA: II  Anesthesia Plan: General   Post-op Pain Management:    Induction: Intravenous  PONV Risk Score and Plan:   Airway Management Planned: Mask  Additional Equipment:   Intra-op Plan:   Post-operative Plan:   Informed Consent: I have reviewed the patients History and Physical, chart, labs and discussed the procedure including the risks, benefits and alternatives for the proposed anesthesia with the patient or authorized representative who has indicated his/her understanding and acceptance.   Dental Advisory Given  Plan Discussed with: CRNA and Surgeon  Anesthesia Plan Comments: (  )        Anesthesia Quick Evaluation

## 2017-07-16 NOTE — Anesthesia Postprocedure Evaluation (Signed)
Anesthesia Post Note  Patient: Charlotte [name]  Procedure(s) Performed: CARDIOVERSION (N/A )     Patient location during evaluation: PACU Anesthesia Type: General Level of consciousness: awake and alert Pain management: pain level controlled Vital Signs Assessment: post-procedure vital signs reviewed and stable Respiratory status: spontaneous breathing, nonlabored ventilation, respiratory function stable and patient connected to nasal cannula oxygen Cardiovascular status: blood pressure returned to baseline and stable Postop Assessment: no apparent nausea or vomiting Anesthetic complications: no    Last Vitals:  Vitals:   07/16/17 1425 07/16/17 1430  BP:  137/63  Pulse:  68  Resp:  15  Temp:    SpO2: 99% 98%    Last Pain:  Vitals:   07/16/17 1351  TempSrc: Oral                 [name] EDWARD

## 2017-07-16 NOTE — Transfer of Care (Signed)
Immediate Anesthesia Transfer of Care Note  Patient: Charlotte [name]  Procedure(s) Performed: CARDIOVERSION (N/A )  Patient Location: PACU and Endoscopy Unit  Anesthesia Type:General  Level of Consciousness: patient cooperative and responds to stimulation  Airway & Oxygen Therapy: Patient Spontanous Breathing  Post-op Assessment: Report given to RN and Post -op Vital signs reviewed and stable  Post vital signs: Reviewed and stable  Last Vitals:  Vitals:   07/16/17 1305  BP: 122/84  Pulse: 74  Resp: 14  Temp: 36.6 C  SpO2: 95%    Last Pain:  Vitals:   07/16/17 1305  TempSrc: Oral         Complications: No apparent anesthesia complications

## 2017-07-16 NOTE — Discharge Instructions (Signed)
Electrical Cardioversion, Care After °This sheet gives you information about how to care for yourself after your procedure. Your health care provider may also give you more specific instructions. If you have problems or questions, contact your health care provider. °What can I expect after the procedure? °After the procedure, it is common to have: °· Some redness on the skin where the shocks were given. ° °Follow these instructions at home: °· Do not drive for 24 hours if you were given a medicine to help you relax (sedative). °· Take over-the-counter and prescription medicines only as told by your health care provider. °· Ask your health care provider how to check your pulse. Check it often. °· Rest for 48 hours after the procedure or as told by your health care provider. °· Avoid or limit your caffeine use as told by your health care provider. °Contact a health care provider if: °· You feel like your heart is beating too quickly or your pulse is not regular. °· You have a serious muscle cramp that does not go away. °Get help right away if: °· You have discomfort in your chest. °· You are dizzy or you feel faint. °· You have trouble breathing or you are short of breath. °· Your speech is slurred. °· You have trouble moving an arm or leg on one side of your body. °· Your fingers or toes turn cold or blue. °This information is not intended to replace advice given to you by your health care provider. Make sure you discuss any questions you have with your health care provider. °Document Released: 07/15/2013 Document Revised: 04/27/2016 Document Reviewed: 03/30/2016 °Elsevier Interactive Patient Education © 2018 [employer] ° °

## 2017-07-16 NOTE — CV Procedure (Signed)
   CARDIOVERSION NOTE  Procedure: Electrical Cardioversion Indications:  Atrial Fibrillation  Procedure Details:  Consent: Risks of procedure as well as the alternatives and risks of each were explained to the (patient/caregiver).  Consent for procedure obtained.  Time Out: Verified patient identification, verified procedure, site/side was marked, verified correct patient position, special equipment/implants available, medications/allergies/relevent history reviewed, required imaging and test results available.  Performed  Patient placed on cardiac monitor, pulse oximetry, supplemental oxygen as necessary.  Sedation given: Propofol per anesthesia Pacer pads placed anterior and posterior chest.  Cardioverted 3 time(s).  Cardioverted at 150J, 200J x 2 biphasic.  Impression: Findings: Post procedure EKG shows: NSR Complications: None Patient did tolerate procedure well.  Plan: 1. Ultimately successful DCCV to NSR. 2. Follow-up with Dr. Okey Dupre or Dr. Eden Emms.  Time Spent Directly with the Patient:  30 minutes   Chrystie Nose, MD, [facility]  [place]  [facility]  Attending Cardiologist  Direct Dial: [phone]  Fax: [phone]  Website:  [email or web address] Nicely Howell [email or web address] 07/16/2017, 1:53 PM

## 2017-07-17 ENCOUNTER — Encounter (HOSPITAL_COMMUNITY): Payer: Self-pay | Admitting: Internal Medicine

## 2017-07-18 ENCOUNTER — Telehealth: Payer: Self-pay | Admitting: Cardiology

## 2017-07-18 ENCOUNTER — Telehealth: Payer: Self-pay | Admitting: Internal Medicine

## 2017-07-18 DIAGNOSIS — I110 Hypertensive heart disease with heart failure: Secondary | ICD-10-CM | POA: Diagnosis not present

## 2017-07-18 DIAGNOSIS — K219 Gastro-esophageal reflux disease without esophagitis: Secondary | ICD-10-CM | POA: Diagnosis not present

## 2017-07-18 DIAGNOSIS — I2510 Atherosclerotic heart disease of native coronary artery without angina pectoris: Secondary | ICD-10-CM | POA: Diagnosis not present

## 2017-07-18 DIAGNOSIS — M545 Low back pain: Secondary | ICD-10-CM | POA: Diagnosis not present

## 2017-07-18 DIAGNOSIS — Z7982 Long term (current) use of aspirin: Secondary | ICD-10-CM | POA: Diagnosis not present

## 2017-07-18 DIAGNOSIS — I5020 Unspecified systolic (congestive) heart failure: Secondary | ICD-10-CM | POA: Diagnosis not present

## 2017-07-18 DIAGNOSIS — I4891 Unspecified atrial fibrillation: Secondary | ICD-10-CM | POA: Diagnosis not present

## 2017-07-18 NOTE — Telephone Encounter (Signed)
New message    Kriste Basque is calling to find out if pt can do physical therapy after having procedure two days ago. Please call.

## 2017-07-18 NOTE — Telephone Encounter (Signed)
Advised no restrictions post cardioversion

## 2017-07-18 NOTE — Telephone Encounter (Signed)
Error

## 2017-07-19 DIAGNOSIS — I2510 Atherosclerotic heart disease of native coronary artery without angina pectoris: Secondary | ICD-10-CM | POA: Diagnosis not present

## 2017-07-19 DIAGNOSIS — I4891 Unspecified atrial fibrillation: Secondary | ICD-10-CM | POA: Diagnosis not present

## 2017-07-19 DIAGNOSIS — I5020 Unspecified systolic (congestive) heart failure: Secondary | ICD-10-CM | POA: Diagnosis not present

## 2017-07-19 DIAGNOSIS — M545 Low back pain: Secondary | ICD-10-CM | POA: Diagnosis not present

## 2017-07-19 DIAGNOSIS — K219 Gastro-esophageal reflux disease without esophagitis: Secondary | ICD-10-CM | POA: Diagnosis not present

## 2017-07-19 DIAGNOSIS — I110 Hypertensive heart disease with heart failure: Secondary | ICD-10-CM | POA: Diagnosis not present

## 2017-07-19 DIAGNOSIS — Z7982 Long term (current) use of aspirin: Secondary | ICD-10-CM | POA: Diagnosis not present

## 2017-07-22 DIAGNOSIS — I5020 Unspecified systolic (congestive) heart failure: Secondary | ICD-10-CM | POA: Diagnosis not present

## 2017-07-22 DIAGNOSIS — Z7982 Long term (current) use of aspirin: Secondary | ICD-10-CM | POA: Diagnosis not present

## 2017-07-22 DIAGNOSIS — I110 Hypertensive heart disease with heart failure: Secondary | ICD-10-CM | POA: Diagnosis not present

## 2017-07-22 DIAGNOSIS — M545 Low back pain: Secondary | ICD-10-CM | POA: Diagnosis not present

## 2017-07-22 DIAGNOSIS — I2510 Atherosclerotic heart disease of native coronary artery without angina pectoris: Secondary | ICD-10-CM | POA: Diagnosis not present

## 2017-07-22 DIAGNOSIS — I4891 Unspecified atrial fibrillation: Secondary | ICD-10-CM | POA: Diagnosis not present

## 2017-07-22 DIAGNOSIS — K219 Gastro-esophageal reflux disease without esophagitis: Secondary | ICD-10-CM | POA: Diagnosis not present

## 2017-07-23 ENCOUNTER — Ambulatory Visit (INDEPENDENT_AMBULATORY_CARE_PROVIDER_SITE_OTHER): Payer: Medicare HMO | Admitting: Physician Assistant

## 2017-07-23 ENCOUNTER — Encounter: Payer: Self-pay | Admitting: Physician Assistant

## 2017-07-23 VITALS — BP 140/78 | HR 70 | Ht 62.5 in | Wt 253.0 lb

## 2017-07-23 DIAGNOSIS — E785 Hyperlipidemia, unspecified: Secondary | ICD-10-CM

## 2017-07-23 DIAGNOSIS — I480 Paroxysmal atrial fibrillation: Secondary | ICD-10-CM | POA: Diagnosis not present

## 2017-07-23 DIAGNOSIS — E119 Type 2 diabetes mellitus without complications: Secondary | ICD-10-CM

## 2017-07-23 DIAGNOSIS — I10 Essential (primary) hypertension: Secondary | ICD-10-CM | POA: Diagnosis not present

## 2017-07-23 DIAGNOSIS — I2510 Atherosclerotic heart disease of native coronary artery without angina pectoris: Secondary | ICD-10-CM | POA: Diagnosis not present

## 2017-07-23 MED ORDER — LOSARTAN POTASSIUM 25 MG PO TABS: 25.0000 mg | ORAL_TABLET | Freq: Every day | ORAL | 3 refills | Status: DC

## 2017-07-23 NOTE — Patient Instructions (Signed)
Medication Instructions:   START LOSARTAN 25 MG ONCE DAILY  Labwork:  Your physician recommends that you return for lab work in: ONE WEEK  Follow-Up:  Your physician recommends that you schedule a follow-up appointment in: 2-3 MONTHS WITH DR Eden Emms   If you need a refill on your cardiac medications before your next appointment, please call your pharmacy.

## 2017-07-23 NOTE — Progress Notes (Addendum)
Cardiology Office Note    Date:  07/25/2017   ID:  Charlotte [name], DOB [birth year], MRN [id number]  PCP:  [name], MD  Cardiologist:  Dr. Johnsie Cancel / Truitt Merle / Dr. Saunders Revel   Chief Complaint  Patient presents with  . Follow-up    seen for Dr. Johnsie Cancel, post cardioversion    History of Present Illness:  Charlotte [name] is a 81 y.o. female with PMH of PAF, nonobstructive CAD, chronic systolic heart failure with normalization of EF, HTN, HLD, and DM II. She was recently evaluated by Dr. Saunders Revel for weakness and shortness of breath. She was hospitalized twice in August, first for GI bleeding and then for lumbar spine compression fracture. Office EKG obtained on 07/12/2017 showed atrial fibrillation, with mildly elevated ventricular rate in the setting of soft blood pressure. Her valsartan was discontinued and carvedilol increased to 12.5 mg twice a day. Since that she has been taking Xarelto ongoing intraoperative for at least 4 weeks, she did not require TEE. Lab work obtained on the same day showedher creatinine has increased to 2.22 up from the previous 0.72. She was briefly admitted. Metolazone, Lasix, potassium supplement and metformin were held for 2 days. She eventually underwent successful DC cardioversion on 07/16/2017 after 3 shocks, one at 150 J and twice at 200 J.  She presents today for cardiology visit. We did not obtain EKG, but she was clearly in sinus rhythm based on physical exam. She no longer have any shortness of breath and weakness. She do not have any cardiac awareness of palpitation initially.    Past Medical History:  Diagnosis Date  . Aortic insufficiency   . Atrial fibrillation ([place])    a. s/p DCCV 03/2013  . CAD (coronary artery disease)    LHC 06/2010: EF 55%, mild plaque in the LAD 20-30%, otherwise normal coronary arteries  . CATARACTS   . Chronic systolic heart failure ([place])    in setting of AFib;  Echocardiogram 02/2013: Moderate LVH, EF 25-30%, anteroseptal  and apical HK, moderate AI, MAC, moderate MR, moderate LAE, mild RAE, PASP 32 => after DCCV f/u Echo 8/14: Moderate LVH, EF 60-65%, normal wall motion, grade 1 diastolic dysfunction, moderate AI, moderate LAE  . Complication of anesthesia 2009   nausea and vomitting  . GERD (gastroesophageal reflux disease)   . HYPERLIPIDEMIA, MILD   . HYPERTENSION, BENIGN SYSTEMIC   . Irritable bowel syndrome   . LBBB (left bundle branch block)   . MEDIAL EPICONDYLITIS   . NSVT (nonsustained ventricular tachycardia) ([place])    during Dob Echo 2011 => normal cath  . OSTEOARTHRITIS   . OSTEOPENIA   . PONV (postoperative nausea and vomiting)   . Pre-diabetes   . RHINITIS, ALLERGIC   . SYNCOPE   . VITAMIN D DEFICIENCY     Past Surgical History:  Procedure Laterality Date  . CARDIOVERSION N/A 04/01/2013   Procedure: CARDIOVERSION;  Surgeon: Josue Hector, MD;  Location: [facility] ENDOSCOPY;  Service: Cardiovascular;  Laterality: N/A;  . CARDIOVERSION N/A 07/16/2017   Procedure: CARDIOVERSION;  Surgeon: Pixie Casino, MD;  Location: [place];  Service: Cardiovascular;  Laterality: N/A;  . CATARACT EXTRACTION, BILATERAL  2012   Dr. Herbert Deaner  . COLONOSCOPY WITH PROPOFOL N/A 05/23/2017   Procedure: COLONOSCOPY WITH PROPOFOL;  Surgeon: Wonda Horner, MD;  Location[facility] ENDOSCOPY;  Service: Endoscopy;  Laterality: N/A;  . ESOPHAGOGASTRODUODENOSCOPY (EGD) WITH PROPOFOL N/A 05/23/2017   Procedure: ESOPHAGOGASTRODUODENOSCOPY (EGD) WITH PROPOFOL;  Surgeon:  Wonda Horner, MD;  Location: [facility] ENDOSCOPY;  Service: Endoscopy;  Laterality: N/A;  . IR KYPHO LUMBAR INC FX REDUCE BONE BX UNI/BIL CANNULATION INC/IMAGING  06/06/2017  . IR RADIOLOGIST EVAL & MGMT  07/01/2017  . RADICAL HYSTERECTOMY    . REPLACEMENT TOTAL KNEE     11/03/07 right....... 03/29/08 left    Current Medications: Outpatient Medications Prior to Visit  Medication Sig Dispense Refill  . Acetaminophen 500 MG coapsule Take 500 mg by mouth every 6 (six)  hours as needed for pain.     Marland Kitchen AMBULATORY NON FORMULARY MEDICATION Disp 1 walker for use as needed due to frequent falls and back pain 1 each 0  . Calcium Carb-Cholecalciferol (CALCIUM 600/VITAMIN D3) 600-800 MG-UNIT TABS Take 1 tablet by mouth 2 (two) times daily.    . carvedilol (COREG) 12.5 MG tablet Take 1 tablet (12.5 mg total) by mouth 2 (two) times daily. 180 tablet 1  . Cholecalciferol (VITAMIN D PO) Take 1 tablet by mouth daily.    . cyclobenzaprine (FLEXERIL) 5 MG tablet Take 1 tablet (5 mg total) by mouth 3 (three) times daily as needed for muscle spasms. 15 tablet 0  . furosemide (LASIX) 20 MG tablet Take 1 tablet (20 mg total) by mouth daily. 1 tablet 0  . gabapentin (NEURONTIN) 300 MG capsule TAKE 1 CAPSULE FOUR TIMES DAILY (Patient taking differently: TAKE 1 CAPSULE (300 MG) BY MOUTH THREE TIMES DAILY AS NEEDED FOR NEUROPATHY) 360 capsule 1  . HYDROcodone-acetaminophen (NORCO/VICODIN) 5-325 MG tablet Take 1 tablet by mouth every 6 (six) hours as needed for severe pain. 30 tablet 0  . iron polysaccharides (NU-IRON) 150 MG capsule Take 1 capsule (150 mg total) by mouth daily. 30 capsule 3  . Multiple Vitamin (MULTIVITAMIN WITH MINERALS) TABS tablet Take 1 tablet by mouth daily.    . Omega-3 Fatty Acids (FISH OIL PO) Take 1 capsule by mouth daily.     . pantoprazole (PROTONIX) 40 MG tablet Take 1 tablet (40 mg total) by mouth 2 (two) times daily before a meal. Please take twice a day before meals for 2 WEEKS, then daily thereafter (Patient taking differently: Take 40 mg by mouth daily before lunch. ) 60 tablet 4  . polyvinyl alcohol-povidone (REFRESH) 1.4-0.6 % ophthalmic solution Place 1-2 drops into both eyes daily as needed (dry eyes).     . potassium chloride (K-DUR,KLOR-CON) 10 MEQ tablet Take 1 tablet (10 mEq total) by mouth 2 (two) times daily. 180 tablet 3  . Psyllium (METAMUCIL PO) Take 5 mLs by mouth daily as needed (constipation). Mix in 8 oz liquid and drink    . rivaroxaban  (XARELTO) 20 MG TABS tablet TAKE 20 mg TABLET EVERY DAY WITH SUPPER, start on 05/25/17 (Patient taking differently: Take 20 mg by mouth daily with supper. , start on 05/25/17) 90 tablet 3  . verapamil (VERELAN PM) 240 MG 24 hr capsule TAKE 1 CAPSULE AT BEDTIME (Patient taking differently: TAKE 1 CAPSULE (240 MG) BY MOUTH AT BEDTIME) 90 capsule 2  . vitamin B-12 (CYANOCOBALAMIN) 1000 MCG tablet Take 1,000 mcg by mouth daily.       No facility-administered medications prior to visit.      Allergies:   Amlodipine besylate-valsartan   Social History   Social History  . Marital status: Divorced    Spouse name: N/A  . Number of children: 1  . Years of education: N/A   Occupational History  . Retired Retired   Social History Main Topics  .  Smoking status: Former Smoker    Quit date: 10/08/1970  . Smokeless tobacco: Never Used  . Alcohol use No  . Drug use: No  . Sexual activity: Not Asked   Other Topics Concern  . None   Social History Narrative  . None     Family History:  The patient's family history includes Alzheimer's disease in her mother; Arrhythmia in her mother and sister; Colon cancer in her father; Diabetes in her sister; Heart attack (age of onset: 27) in her father.   ROS:   Please see the history of present illness.    ROS All other systems reviewed and are negative.   PHYSICAL EXAM:   VS:  BP 140/78   Pulse 70   Ht 5' 2.5" (1.588 m)   Wt 253 lb (114.8 kg)   BMI 45.54 kg/m    GEN: Well nourished, well developed, in no acute distress  HEENT: normal  Neck: no JVD, carotid bruits, or masses Cardiac: RRR; no murmurs, rubs, or gallops,no edema  Respiratory:  clear to auscultation bilaterally, normal work of breathing GI: soft, nontender, nondistended, + BS MS: no deformity or atrophy  Skin: warm and dry, no rash Neuro:  Alert and Oriented x 3, Strength and sensation are intact Psych: euthymic mood, full affect  Wt Readings from Last 3 Encounters:  07/23/17  253 lb (114.8 kg)  07/13/17 245 lb (111.1 kg)  07/12/17 245 lb (111.1 kg)      Studies/Labs Reviewed:   EKG:  EKG is not ordered today.    Recent Labs: 05/21/2017: NT-Pro BNP 332 06/02/2017: TSH 2.507 06/04/2017: ALT 14 07/12/2017: Magnesium 1.9 07/13/2017: B Natriuretic Peptide 364.2; Hemoglobin 10.7; Platelets 281 07/14/2017: BUN 52; Creatinine, Ser 1.34; Potassium 3.7; Sodium 135   Lipid Panel    Component Value Date/Time   CHOL 193 03/01/2017 1148   TRIG 76 03/01/2017 1148   TRIG 112 06/26/2006   HDL 54 03/01/2017 1148   CHOLHDL 3.6 03/01/2017 1148   VLDL 15 03/01/2017 1148   LDLCALC 124 (H) 03/01/2017 1148    Additional studies/ records that were reviewed today include:   Echo 02/27/2016 LV EF: 55% -   60%  Study Conclusions  - Left ventricle: The cavity size was normal. There was mild focal   basal and mild concentric hypertrophy of the septum. Systolic   function was normal. The estimated ejection fraction was in the   range of 55% to 60%. Wall motion was normal; there were no   regional wall motion abnormalities. There was an increased   relative contribution of atrial contraction to ventricular   filling. Doppler parameters are consistent with abnormal left   ventricular relaxation (grade 1 diastolic dysfunction). - Ventricular septum: Septal motion showed moderate paradox. These   changes are consistent with intraventricular conduction delay. - Aortic valve: Trileaflet; normal thickness, mildly calcified   leaflets. There was mild regurgitation. - Mitral valve: Calcified annulus. Mild focal calcification of the   anterior leaflet (medial segment(s)). - Atrial septum: There was increased thickness of the septum,   consistent with lipomatous hypertrophy. - Tricuspid valve: There was trivial regurgitation.   DCCV 07/16/2017  Cardioverted 3 time(s).  Cardioverted at 150J, 200J x 2 biphasic.  Impression: Findings: Post procedure EKG shows:  NSR Complications: None Patient did tolerate procedure well.  Plan: 1. Ultimately successful DCCV to NSR.   ASSESSMENT:    1. Paroxysmal atrial fibrillation (HCC)   2. Coronary artery disease involving native coronary artery of native  heart without angina pectoris   3. Essential hypertension   4. Hyperlipidemia, unspecified hyperlipidemia type   5. Controlled type 2 diabetes mellitus without complication, without long-term current use of insulin (HCC)      PLAN:  In order of problems listed above:  2. Paroxysmal atrial fibrillation: Underwent DC cardioversion. Maintaining sinus rhythm. Continue carvedilol and verapamil. Continue Xarelto  3. CAD: No chest pain. On carvedilol, not on aspirin due to Xarelto.  4. Hypertension: Blood pressure mildly elevated, will add losartan.  5. Hyperlipidemia: Last lipid panel for month ago showed cholesterol 193, HDL 54, LDL 124, triglycerides 76. A year ago LDL was normal. Recommended diet and exercise.  6. DM 2: Will defer to primary care provider    Medication Adjustments/Labs and Tests Ordered: Current medicines are reviewed at length with the patient today.  Concerns regarding medicines are outlined above.  Medication changes, Labs and Tests ordered today are listed in the Patient Instructions below. Patient Instructions  Medication Instructions:   START LOSARTAN 25 MG ONCE DAILY  Labwork:  Your physician recommends that you return for lab work in: [place]:  Your physician recommends that you schedule a follow-up appointment in: 2-3 [place] Johnsie Cancel   If you need a refill on your cardiac medications before your next appointment, please call your pharmacy.       Hilbert Corrigan, [place]  07/25/2017 7:23 AM    [address], [place], [place]  [address] Phone: [phone]; Fax: [phone]

## 2017-07-25 ENCOUNTER — Encounter: Payer: Self-pay | Admitting: Physician Assistant

## 2017-07-25 DIAGNOSIS — I2510 Atherosclerotic heart disease of native coronary artery without angina pectoris: Secondary | ICD-10-CM | POA: Diagnosis not present

## 2017-07-25 DIAGNOSIS — I4891 Unspecified atrial fibrillation: Secondary | ICD-10-CM | POA: Diagnosis not present

## 2017-07-25 DIAGNOSIS — K219 Gastro-esophageal reflux disease without esophagitis: Secondary | ICD-10-CM | POA: Diagnosis not present

## 2017-07-25 DIAGNOSIS — M545 Low back pain: Secondary | ICD-10-CM | POA: Diagnosis not present

## 2017-07-25 DIAGNOSIS — Z7982 Long term (current) use of aspirin: Secondary | ICD-10-CM | POA: Diagnosis not present

## 2017-07-25 DIAGNOSIS — I5020 Unspecified systolic (congestive) heart failure: Secondary | ICD-10-CM | POA: Diagnosis not present

## 2017-07-25 DIAGNOSIS — I110 Hypertensive heart disease with heart failure: Secondary | ICD-10-CM | POA: Diagnosis not present

## 2017-07-26 DIAGNOSIS — I2510 Atherosclerotic heart disease of native coronary artery without angina pectoris: Secondary | ICD-10-CM | POA: Diagnosis not present

## 2017-07-26 DIAGNOSIS — I5020 Unspecified systolic (congestive) heart failure: Secondary | ICD-10-CM | POA: Diagnosis not present

## 2017-07-26 DIAGNOSIS — I110 Hypertensive heart disease with heart failure: Secondary | ICD-10-CM | POA: Diagnosis not present

## 2017-07-26 DIAGNOSIS — Z7982 Long term (current) use of aspirin: Secondary | ICD-10-CM | POA: Diagnosis not present

## 2017-07-26 DIAGNOSIS — I4891 Unspecified atrial fibrillation: Secondary | ICD-10-CM | POA: Diagnosis not present

## 2017-07-26 DIAGNOSIS — K219 Gastro-esophageal reflux disease without esophagitis: Secondary | ICD-10-CM | POA: Diagnosis not present

## 2017-07-26 DIAGNOSIS — M545 Low back pain: Secondary | ICD-10-CM | POA: Diagnosis not present

## 2017-07-29 ENCOUNTER — Other Ambulatory Visit: Payer: Self-pay | Admitting: Physician Assistant

## 2017-07-29 DIAGNOSIS — M545 Low back pain: Secondary | ICD-10-CM | POA: Diagnosis not present

## 2017-07-29 DIAGNOSIS — K219 Gastro-esophageal reflux disease without esophagitis: Secondary | ICD-10-CM | POA: Diagnosis not present

## 2017-07-29 DIAGNOSIS — I110 Hypertensive heart disease with heart failure: Secondary | ICD-10-CM | POA: Diagnosis not present

## 2017-07-29 DIAGNOSIS — I480 Paroxysmal atrial fibrillation: Secondary | ICD-10-CM | POA: Diagnosis not present

## 2017-07-29 DIAGNOSIS — I5020 Unspecified systolic (congestive) heart failure: Secondary | ICD-10-CM | POA: Diagnosis not present

## 2017-07-29 DIAGNOSIS — Z7982 Long term (current) use of aspirin: Secondary | ICD-10-CM | POA: Diagnosis not present

## 2017-07-29 DIAGNOSIS — I4891 Unspecified atrial fibrillation: Secondary | ICD-10-CM | POA: Diagnosis not present

## 2017-07-29 DIAGNOSIS — I2510 Atherosclerotic heart disease of native coronary artery without angina pectoris: Secondary | ICD-10-CM | POA: Diagnosis not present

## 2017-07-30 LAB — BASIC METABOLIC PANEL WITH GFR
BUN: 17 mg/dL (ref 7–25)
CALCIUM: 9.7 mg/dL (ref 8.6–10.4)
CHLORIDE: 103 mmol/L (ref 98–110)
CO2: 28 mmol/L (ref 20–32)
Creat: 0.89 mg/dL (ref 0.60–0.93)
GFR, EST AFRICAN AMERICAN: 71 mL/min/{1.73_m2} (ref >=60)
GFR, Est Non African American: 62 mL/min/{1.73_m2} (ref >=60)
GLUCOSE: 89 mg/dL (ref 65–99)
POTASSIUM: 4.6 mmol/L (ref 3.5–5.3)
Sodium: 138 mmol/L (ref 135–146)

## 2017-07-31 DIAGNOSIS — I5020 Unspecified systolic (congestive) heart failure: Secondary | ICD-10-CM | POA: Diagnosis not present

## 2017-07-31 DIAGNOSIS — Z7982 Long term (current) use of aspirin: Secondary | ICD-10-CM | POA: Diagnosis not present

## 2017-07-31 DIAGNOSIS — M545 Low back pain: Secondary | ICD-10-CM | POA: Diagnosis not present

## 2017-07-31 DIAGNOSIS — K219 Gastro-esophageal reflux disease without esophagitis: Secondary | ICD-10-CM | POA: Diagnosis not present

## 2017-07-31 DIAGNOSIS — I2510 Atherosclerotic heart disease of native coronary artery without angina pectoris: Secondary | ICD-10-CM | POA: Diagnosis not present

## 2017-07-31 DIAGNOSIS — S32030D Wedge compression fracture of third lumbar vertebra, subsequent encounter for fracture with routine healing: Secondary | ICD-10-CM | POA: Diagnosis not present

## 2017-07-31 DIAGNOSIS — E119 Type 2 diabetes mellitus without complications: Secondary | ICD-10-CM | POA: Diagnosis not present

## 2017-07-31 DIAGNOSIS — I4891 Unspecified atrial fibrillation: Secondary | ICD-10-CM | POA: Diagnosis not present

## 2017-07-31 DIAGNOSIS — I110 Hypertensive heart disease with heart failure: Secondary | ICD-10-CM | POA: Diagnosis not present

## 2017-08-01 DIAGNOSIS — Z7982 Long term (current) use of aspirin: Secondary | ICD-10-CM | POA: Diagnosis not present

## 2017-08-01 DIAGNOSIS — I5020 Unspecified systolic (congestive) heart failure: Secondary | ICD-10-CM | POA: Diagnosis not present

## 2017-08-01 DIAGNOSIS — E119 Type 2 diabetes mellitus without complications: Secondary | ICD-10-CM | POA: Diagnosis not present

## 2017-08-01 DIAGNOSIS — I2510 Atherosclerotic heart disease of native coronary artery without angina pectoris: Secondary | ICD-10-CM | POA: Diagnosis not present

## 2017-08-01 DIAGNOSIS — I4891 Unspecified atrial fibrillation: Secondary | ICD-10-CM | POA: Diagnosis not present

## 2017-08-01 DIAGNOSIS — K219 Gastro-esophageal reflux disease without esophagitis: Secondary | ICD-10-CM | POA: Diagnosis not present

## 2017-08-01 DIAGNOSIS — I110 Hypertensive heart disease with heart failure: Secondary | ICD-10-CM | POA: Diagnosis not present

## 2017-08-01 DIAGNOSIS — S32030D Wedge compression fracture of third lumbar vertebra, subsequent encounter for fracture with routine healing: Secondary | ICD-10-CM | POA: Diagnosis not present

## 2017-08-01 DIAGNOSIS — M545 Low back pain: Secondary | ICD-10-CM | POA: Diagnosis not present

## 2017-08-05 DIAGNOSIS — I4891 Unspecified atrial fibrillation: Secondary | ICD-10-CM | POA: Diagnosis not present

## 2017-08-05 DIAGNOSIS — M545 Low back pain: Secondary | ICD-10-CM | POA: Diagnosis not present

## 2017-08-05 DIAGNOSIS — I110 Hypertensive heart disease with heart failure: Secondary | ICD-10-CM | POA: Diagnosis not present

## 2017-08-05 DIAGNOSIS — I5020 Unspecified systolic (congestive) heart failure: Secondary | ICD-10-CM | POA: Diagnosis not present

## 2017-08-05 DIAGNOSIS — S32030D Wedge compression fracture of third lumbar vertebra, subsequent encounter for fracture with routine healing: Secondary | ICD-10-CM | POA: Diagnosis not present

## 2017-08-05 DIAGNOSIS — E119 Type 2 diabetes mellitus without complications: Secondary | ICD-10-CM | POA: Diagnosis not present

## 2017-08-05 DIAGNOSIS — K219 Gastro-esophageal reflux disease without esophagitis: Secondary | ICD-10-CM | POA: Diagnosis not present

## 2017-08-05 DIAGNOSIS — I2510 Atherosclerotic heart disease of native coronary artery without angina pectoris: Secondary | ICD-10-CM | POA: Diagnosis not present

## 2017-08-05 DIAGNOSIS — Z7982 Long term (current) use of aspirin: Secondary | ICD-10-CM | POA: Diagnosis not present

## 2017-08-07 DIAGNOSIS — I5020 Unspecified systolic (congestive) heart failure: Secondary | ICD-10-CM | POA: Diagnosis not present

## 2017-08-07 DIAGNOSIS — E119 Type 2 diabetes mellitus without complications: Secondary | ICD-10-CM | POA: Diagnosis not present

## 2017-08-07 DIAGNOSIS — K219 Gastro-esophageal reflux disease without esophagitis: Secondary | ICD-10-CM | POA: Diagnosis not present

## 2017-08-07 DIAGNOSIS — I2510 Atherosclerotic heart disease of native coronary artery without angina pectoris: Secondary | ICD-10-CM | POA: Diagnosis not present

## 2017-08-07 DIAGNOSIS — M545 Low back pain: Secondary | ICD-10-CM | POA: Diagnosis not present

## 2017-08-07 DIAGNOSIS — I110 Hypertensive heart disease with heart failure: Secondary | ICD-10-CM | POA: Diagnosis not present

## 2017-08-07 DIAGNOSIS — Z7982 Long term (current) use of aspirin: Secondary | ICD-10-CM | POA: Diagnosis not present

## 2017-08-07 DIAGNOSIS — S32030D Wedge compression fracture of third lumbar vertebra, subsequent encounter for fracture with routine healing: Secondary | ICD-10-CM | POA: Diagnosis not present

## 2017-08-07 DIAGNOSIS — I4891 Unspecified atrial fibrillation: Secondary | ICD-10-CM | POA: Diagnosis not present

## 2017-08-12 ENCOUNTER — Telehealth: Payer: Self-pay | Admitting: Cardiovascular Disease

## 2017-08-12 DIAGNOSIS — I5020 Unspecified systolic (congestive) heart failure: Secondary | ICD-10-CM | POA: Diagnosis not present

## 2017-08-12 DIAGNOSIS — K219 Gastro-esophageal reflux disease without esophagitis: Secondary | ICD-10-CM | POA: Diagnosis not present

## 2017-08-12 DIAGNOSIS — I110 Hypertensive heart disease with heart failure: Secondary | ICD-10-CM | POA: Diagnosis not present

## 2017-08-12 DIAGNOSIS — Z7982 Long term (current) use of aspirin: Secondary | ICD-10-CM | POA: Diagnosis not present

## 2017-08-12 DIAGNOSIS — I2510 Atherosclerotic heart disease of native coronary artery without angina pectoris: Secondary | ICD-10-CM | POA: Diagnosis not present

## 2017-08-12 DIAGNOSIS — E119 Type 2 diabetes mellitus without complications: Secondary | ICD-10-CM | POA: Diagnosis not present

## 2017-08-12 DIAGNOSIS — S32030D Wedge compression fracture of third lumbar vertebra, subsequent encounter for fracture with routine healing: Secondary | ICD-10-CM | POA: Diagnosis not present

## 2017-08-12 DIAGNOSIS — M545 Low back pain: Secondary | ICD-10-CM | POA: Diagnosis not present

## 2017-08-12 DIAGNOSIS — I4891 Unspecified atrial fibrillation: Secondary | ICD-10-CM | POA: Diagnosis not present

## 2017-08-12 NOTE — Telephone Encounter (Signed)
Called patient back about her message. Patient stated she has gained weight in the last couple of week. Patient last recorded weight was 253 lbs on 07/23/17. Patient stated she is now at 250 lbs. Informed patient that if she has a weight gain of 3lbs in a 24 hour or 5 lbs in a week that she should call our office. Informed patient that her weight according to our records has gone down. Patient stated she is taking her lasix, potassium, coreg, losartan, verapamil, and xarelto as instructed. Patient denies any SOB or chest pain. Patient complained of swelling in ankles. Patient stated she does have a lot of issues with her feet. Instructed patient to elevated her feet when she is sitting and to call her PCP about her swollen feet. Informed patient that a message would be sent to Dr. Eden Emms for further advisement. Patient verbalized understanding.

## 2017-08-12 NOTE — Telephone Encounter (Signed)
New Message  Pt c/o swelling: STAT is pt has developed SOB within 24 hours  1) How much weight have you gained and in what time span? From 10/18 until 11/5 pt states her weight has being fluctuating   2) If swelling, where is the swelling located? Both ankles   3) Are you currently taking a fluid pill? Yes lasix 1 tab a day  4) Are you currently SOB? No   Do you have a log of your daily weights (if so, list)? 10/18 250lbs, 10/20 245lbs,10/27 244lbs, 11/5 250lbs  5) Have you gained 3 pounds in a day or 5 pounds in a week? Per pt 6 pounds in two weeks  6) Have you traveled recently? no

## 2017-08-12 NOTE — Telephone Encounter (Signed)
She sounds fine no changes

## 2017-08-13 NOTE — Telephone Encounter (Signed)
Left message for patient to call back  

## 2017-08-13 NOTE — Telephone Encounter (Signed)
Patient called back. Informed patient of Dr. Fabio Bering response. Patient verbalized understanding.

## 2017-08-14 DIAGNOSIS — I5020 Unspecified systolic (congestive) heart failure: Secondary | ICD-10-CM | POA: Diagnosis not present

## 2017-08-14 DIAGNOSIS — K219 Gastro-esophageal reflux disease without esophagitis: Secondary | ICD-10-CM | POA: Diagnosis not present

## 2017-08-14 DIAGNOSIS — S32030D Wedge compression fracture of third lumbar vertebra, subsequent encounter for fracture with routine healing: Secondary | ICD-10-CM | POA: Diagnosis not present

## 2017-08-14 DIAGNOSIS — M545 Low back pain: Secondary | ICD-10-CM | POA: Diagnosis not present

## 2017-08-14 DIAGNOSIS — I4891 Unspecified atrial fibrillation: Secondary | ICD-10-CM | POA: Diagnosis not present

## 2017-08-14 DIAGNOSIS — I2510 Atherosclerotic heart disease of native coronary artery without angina pectoris: Secondary | ICD-10-CM | POA: Diagnosis not present

## 2017-08-14 DIAGNOSIS — E119 Type 2 diabetes mellitus without complications: Secondary | ICD-10-CM | POA: Diagnosis not present

## 2017-08-14 DIAGNOSIS — I110 Hypertensive heart disease with heart failure: Secondary | ICD-10-CM | POA: Diagnosis not present

## 2017-08-14 DIAGNOSIS — Z7982 Long term (current) use of aspirin: Secondary | ICD-10-CM | POA: Diagnosis not present

## 2017-08-19 DIAGNOSIS — I4891 Unspecified atrial fibrillation: Secondary | ICD-10-CM | POA: Diagnosis not present

## 2017-08-19 DIAGNOSIS — I110 Hypertensive heart disease with heart failure: Secondary | ICD-10-CM | POA: Diagnosis not present

## 2017-08-19 DIAGNOSIS — I5020 Unspecified systolic (congestive) heart failure: Secondary | ICD-10-CM | POA: Diagnosis not present

## 2017-08-19 DIAGNOSIS — S32030D Wedge compression fracture of third lumbar vertebra, subsequent encounter for fracture with routine healing: Secondary | ICD-10-CM | POA: Diagnosis not present

## 2017-08-19 DIAGNOSIS — M545 Low back pain: Secondary | ICD-10-CM | POA: Diagnosis not present

## 2017-08-19 DIAGNOSIS — K219 Gastro-esophageal reflux disease without esophagitis: Secondary | ICD-10-CM | POA: Diagnosis not present

## 2017-08-19 DIAGNOSIS — Z7982 Long term (current) use of aspirin: Secondary | ICD-10-CM | POA: Diagnosis not present

## 2017-08-19 DIAGNOSIS — E119 Type 2 diabetes mellitus without complications: Secondary | ICD-10-CM | POA: Diagnosis not present

## 2017-08-19 DIAGNOSIS — I2510 Atherosclerotic heart disease of native coronary artery without angina pectoris: Secondary | ICD-10-CM | POA: Diagnosis not present

## 2017-08-21 DIAGNOSIS — I4891 Unspecified atrial fibrillation: Secondary | ICD-10-CM | POA: Diagnosis not present

## 2017-08-21 DIAGNOSIS — S32030D Wedge compression fracture of third lumbar vertebra, subsequent encounter for fracture with routine healing: Secondary | ICD-10-CM | POA: Diagnosis not present

## 2017-08-21 DIAGNOSIS — I5020 Unspecified systolic (congestive) heart failure: Secondary | ICD-10-CM | POA: Diagnosis not present

## 2017-08-21 DIAGNOSIS — I2510 Atherosclerotic heart disease of native coronary artery without angina pectoris: Secondary | ICD-10-CM | POA: Diagnosis not present

## 2017-08-21 DIAGNOSIS — I110 Hypertensive heart disease with heart failure: Secondary | ICD-10-CM | POA: Diagnosis not present

## 2017-08-21 DIAGNOSIS — E119 Type 2 diabetes mellitus without complications: Secondary | ICD-10-CM | POA: Diagnosis not present

## 2017-08-21 DIAGNOSIS — M545 Low back pain: Secondary | ICD-10-CM | POA: Diagnosis not present

## 2017-08-21 DIAGNOSIS — Z7982 Long term (current) use of aspirin: Secondary | ICD-10-CM | POA: Diagnosis not present

## 2017-08-21 DIAGNOSIS — K219 Gastro-esophageal reflux disease without esophagitis: Secondary | ICD-10-CM | POA: Diagnosis not present

## 2017-08-26 ENCOUNTER — Encounter: Payer: Self-pay | Admitting: Family Medicine

## 2017-08-26 ENCOUNTER — Ambulatory Visit: Payer: Medicare HMO | Admitting: Family Medicine

## 2017-08-26 VITALS — BP 138/68 | HR 65 | Ht 63.0 in | Wt 252.0 lb

## 2017-08-26 DIAGNOSIS — E119 Type 2 diabetes mellitus without complications: Secondary | ICD-10-CM | POA: Diagnosis not present

## 2017-08-26 DIAGNOSIS — I10 Essential (primary) hypertension: Secondary | ICD-10-CM

## 2017-08-26 DIAGNOSIS — E1169 Type 2 diabetes mellitus with other specified complication: Secondary | ICD-10-CM | POA: Diagnosis not present

## 2017-08-26 DIAGNOSIS — I110 Hypertensive heart disease with heart failure: Secondary | ICD-10-CM | POA: Diagnosis not present

## 2017-08-26 DIAGNOSIS — N179 Acute kidney failure, unspecified: Secondary | ICD-10-CM | POA: Diagnosis not present

## 2017-08-26 DIAGNOSIS — Z8781 Personal history of (healed) traumatic fracture: Secondary | ICD-10-CM

## 2017-08-26 DIAGNOSIS — I4891 Unspecified atrial fibrillation: Secondary | ICD-10-CM | POA: Diagnosis not present

## 2017-08-26 DIAGNOSIS — Z7982 Long term (current) use of aspirin: Secondary | ICD-10-CM | POA: Diagnosis not present

## 2017-08-26 DIAGNOSIS — M545 Low back pain: Secondary | ICD-10-CM | POA: Diagnosis not present

## 2017-08-26 DIAGNOSIS — I2510 Atherosclerotic heart disease of native coronary artery without angina pectoris: Secondary | ICD-10-CM | POA: Diagnosis not present

## 2017-08-26 DIAGNOSIS — E669 Obesity, unspecified: Secondary | ICD-10-CM

## 2017-08-26 DIAGNOSIS — K219 Gastro-esophageal reflux disease without esophagitis: Secondary | ICD-10-CM | POA: Diagnosis not present

## 2017-08-26 DIAGNOSIS — I5020 Unspecified systolic (congestive) heart failure: Secondary | ICD-10-CM | POA: Diagnosis not present

## 2017-08-26 DIAGNOSIS — S32030D Wedge compression fracture of third lumbar vertebra, subsequent encounter for fracture with routine healing: Secondary | ICD-10-CM | POA: Diagnosis not present

## 2017-08-26 LAB — BASIC METABOLIC PANEL WITH GFR
BUN: 17 mg/dL (ref 7–25)
CALCIUM: 9.6 mg/dL (ref 8.6–10.4)
CHLORIDE: 105 mmol/L (ref 98–110)
CO2: 25 mmol/L (ref 20–32)
Creat: 0.87 mg/dL (ref 0.60–0.93)
GFR, EST AFRICAN AMERICAN: 73 mL/min/{1.73_m2} (ref >=60)
GFR, Est Non African American: 63 mL/min/{1.73_m2} (ref >=60)
GLUCOSE: 101 mg/dL — AB (ref 65–99)
POTASSIUM: 4 mmol/L (ref 3.5–5.3)
Sodium: 140 mmol/L (ref 135–146)

## 2017-08-26 LAB — POCT GLYCOSYLATED HEMOGLOBIN (HGB A1C): HEMOGLOBIN A1C: 5.5

## 2017-08-26 NOTE — Progress Notes (Signed)
Subjective:    CC: DM, HTN  HPI:  Diabetes - no hypoglycemic events. No wounds or sores that are not healing well. No increased thirst or urination. Checking glucose at home. Taking medications as prescribed without any side effects.  Has had to reschedule her eye apt.    Hypertension- Pt denies chest pain, SOB, dizziness, or heart palpitations.  Taking meds as directed w/o problems.  Denies medication side effects.  She says she just had physical therapy at home today and then rushed to get here and thinks that is why her blood pressure is probably elevated.  Recently diagnosed with atrial fibrillation and actually underwent a cardioversion on October 9.  thsi went well overall.  They added losartan 25 mg to her regimen 4 weeks ago.  She also recently had an episode of renal failure.  They held her metformin, Lasix and losartan.  Since then they have restarted her Lasix but at a lower dose of 20 mg daily  Vertebral fracture-she has been getting physical therapy for about the last 8 weeks.  They are going to continue it as she is making some strides but she still has a lot of lower extremity weakness.   Past medical history, Surgical history, Family history not pertinant except as noted below, Social history, Allergies, and medications have been entered into the medical record, reviewed, and corrections made.   Review of Systems: No fevers, chills, night sweats, weight loss, chest pain, or shortness of breath.   Objective:    General: Well Developed, well nourished, and in no acute distress.  Neuro: Alert and oriented x3, extra-ocular muscles intact, sensation grossly intact.  HEENT: Normocephalic, atraumatic  Skin: Warm and dry, no rashes. Cardiac: Regular rate and rhythm, no murmurs rubs or gallops, no lower extremity edema.  Respiratory: Clear to auscultation bilaterally. Not using accessory muscles, speaking in full sentences.   Impression and Recommendations:    HTN -recheck BMP  today since has restarted Lasix and losartan.  DM - Well controlled. Continue current regimen. Follow up in  4 months.  A1C of 5.5.  We will continue to hold metformin.  Recent episode of acute renal failure-now that she is restarted the losartan 25 mg daily and has been on it for about 4 weeks and we will recheck a BMP today.  Hx of Vertebral fracture-continue with physical therapy at home. She is improving in LE stregnth.  Taking calcium.  Not currently on a bisphosphonate.  Can diiscuss possibly adding this at next office visit.

## 2017-08-27 DIAGNOSIS — E119 Type 2 diabetes mellitus without complications: Secondary | ICD-10-CM | POA: Diagnosis not present

## 2017-08-27 DIAGNOSIS — I110 Hypertensive heart disease with heart failure: Secondary | ICD-10-CM | POA: Diagnosis not present

## 2017-08-27 DIAGNOSIS — I4891 Unspecified atrial fibrillation: Secondary | ICD-10-CM | POA: Diagnosis not present

## 2017-08-27 DIAGNOSIS — I5020 Unspecified systolic (congestive) heart failure: Secondary | ICD-10-CM | POA: Diagnosis not present

## 2017-08-27 DIAGNOSIS — I2510 Atherosclerotic heart disease of native coronary artery without angina pectoris: Secondary | ICD-10-CM | POA: Diagnosis not present

## 2017-08-27 DIAGNOSIS — K219 Gastro-esophageal reflux disease without esophagitis: Secondary | ICD-10-CM | POA: Diagnosis not present

## 2017-08-27 DIAGNOSIS — Z7982 Long term (current) use of aspirin: Secondary | ICD-10-CM | POA: Diagnosis not present

## 2017-08-27 DIAGNOSIS — S32030D Wedge compression fracture of third lumbar vertebra, subsequent encounter for fracture with routine healing: Secondary | ICD-10-CM | POA: Diagnosis not present

## 2017-08-27 DIAGNOSIS — M545 Low back pain: Secondary | ICD-10-CM | POA: Diagnosis not present

## 2017-08-28 DIAGNOSIS — I2510 Atherosclerotic heart disease of native coronary artery without angina pectoris: Secondary | ICD-10-CM | POA: Diagnosis not present

## 2017-08-28 DIAGNOSIS — I110 Hypertensive heart disease with heart failure: Secondary | ICD-10-CM | POA: Diagnosis not present

## 2017-08-28 DIAGNOSIS — M545 Low back pain: Secondary | ICD-10-CM | POA: Diagnosis not present

## 2017-08-28 DIAGNOSIS — K219 Gastro-esophageal reflux disease without esophagitis: Secondary | ICD-10-CM | POA: Diagnosis not present

## 2017-08-28 DIAGNOSIS — I5020 Unspecified systolic (congestive) heart failure: Secondary | ICD-10-CM | POA: Diagnosis not present

## 2017-08-28 DIAGNOSIS — E119 Type 2 diabetes mellitus without complications: Secondary | ICD-10-CM | POA: Diagnosis not present

## 2017-08-28 DIAGNOSIS — I4891 Unspecified atrial fibrillation: Secondary | ICD-10-CM | POA: Diagnosis not present

## 2017-08-28 DIAGNOSIS — Z7982 Long term (current) use of aspirin: Secondary | ICD-10-CM | POA: Diagnosis not present

## 2017-08-28 DIAGNOSIS — S32030D Wedge compression fracture of third lumbar vertebra, subsequent encounter for fracture with routine healing: Secondary | ICD-10-CM | POA: Diagnosis not present

## 2017-09-02 DIAGNOSIS — E119 Type 2 diabetes mellitus without complications: Secondary | ICD-10-CM | POA: Diagnosis not present

## 2017-09-02 DIAGNOSIS — K219 Gastro-esophageal reflux disease without esophagitis: Secondary | ICD-10-CM | POA: Diagnosis not present

## 2017-09-02 DIAGNOSIS — M545 Low back pain: Secondary | ICD-10-CM | POA: Diagnosis not present

## 2017-09-02 DIAGNOSIS — I110 Hypertensive heart disease with heart failure: Secondary | ICD-10-CM | POA: Diagnosis not present

## 2017-09-02 DIAGNOSIS — S32030D Wedge compression fracture of third lumbar vertebra, subsequent encounter for fracture with routine healing: Secondary | ICD-10-CM | POA: Diagnosis not present

## 2017-09-02 DIAGNOSIS — I4891 Unspecified atrial fibrillation: Secondary | ICD-10-CM | POA: Diagnosis not present

## 2017-09-02 DIAGNOSIS — I2510 Atherosclerotic heart disease of native coronary artery without angina pectoris: Secondary | ICD-10-CM | POA: Diagnosis not present

## 2017-09-02 DIAGNOSIS — I5020 Unspecified systolic (congestive) heart failure: Secondary | ICD-10-CM | POA: Diagnosis not present

## 2017-09-02 DIAGNOSIS — Z7982 Long term (current) use of aspirin: Secondary | ICD-10-CM | POA: Diagnosis not present

## 2017-09-04 DIAGNOSIS — I110 Hypertensive heart disease with heart failure: Secondary | ICD-10-CM | POA: Diagnosis not present

## 2017-09-04 DIAGNOSIS — S32030D Wedge compression fracture of third lumbar vertebra, subsequent encounter for fracture with routine healing: Secondary | ICD-10-CM | POA: Diagnosis not present

## 2017-09-04 DIAGNOSIS — I4891 Unspecified atrial fibrillation: Secondary | ICD-10-CM | POA: Diagnosis not present

## 2017-09-04 DIAGNOSIS — K219 Gastro-esophageal reflux disease without esophagitis: Secondary | ICD-10-CM | POA: Diagnosis not present

## 2017-09-04 DIAGNOSIS — M545 Low back pain: Secondary | ICD-10-CM | POA: Diagnosis not present

## 2017-09-04 DIAGNOSIS — E119 Type 2 diabetes mellitus without complications: Secondary | ICD-10-CM | POA: Diagnosis not present

## 2017-09-04 DIAGNOSIS — Z7982 Long term (current) use of aspirin: Secondary | ICD-10-CM | POA: Diagnosis not present

## 2017-09-04 DIAGNOSIS — I2510 Atherosclerotic heart disease of native coronary artery without angina pectoris: Secondary | ICD-10-CM | POA: Diagnosis not present

## 2017-09-04 DIAGNOSIS — I5020 Unspecified systolic (congestive) heart failure: Secondary | ICD-10-CM | POA: Diagnosis not present

## 2017-09-10 DIAGNOSIS — I110 Hypertensive heart disease with heart failure: Secondary | ICD-10-CM | POA: Diagnosis not present

## 2017-09-10 DIAGNOSIS — I2510 Atherosclerotic heart disease of native coronary artery without angina pectoris: Secondary | ICD-10-CM | POA: Diagnosis not present

## 2017-09-10 DIAGNOSIS — I4891 Unspecified atrial fibrillation: Secondary | ICD-10-CM | POA: Diagnosis not present

## 2017-09-10 DIAGNOSIS — E119 Type 2 diabetes mellitus without complications: Secondary | ICD-10-CM | POA: Diagnosis not present

## 2017-09-10 DIAGNOSIS — K219 Gastro-esophageal reflux disease without esophagitis: Secondary | ICD-10-CM | POA: Diagnosis not present

## 2017-09-10 DIAGNOSIS — Z7982 Long term (current) use of aspirin: Secondary | ICD-10-CM | POA: Diagnosis not present

## 2017-09-10 DIAGNOSIS — I5020 Unspecified systolic (congestive) heart failure: Secondary | ICD-10-CM | POA: Diagnosis not present

## 2017-09-10 DIAGNOSIS — S32030D Wedge compression fracture of third lumbar vertebra, subsequent encounter for fracture with routine healing: Secondary | ICD-10-CM | POA: Diagnosis not present

## 2017-09-10 DIAGNOSIS — M545 Low back pain: Secondary | ICD-10-CM | POA: Diagnosis not present

## 2017-09-11 DIAGNOSIS — I4891 Unspecified atrial fibrillation: Secondary | ICD-10-CM | POA: Diagnosis not present

## 2017-09-11 DIAGNOSIS — I2510 Atherosclerotic heart disease of native coronary artery without angina pectoris: Secondary | ICD-10-CM | POA: Diagnosis not present

## 2017-09-11 DIAGNOSIS — K219 Gastro-esophageal reflux disease without esophagitis: Secondary | ICD-10-CM | POA: Diagnosis not present

## 2017-09-11 DIAGNOSIS — I5020 Unspecified systolic (congestive) heart failure: Secondary | ICD-10-CM | POA: Diagnosis not present

## 2017-09-11 DIAGNOSIS — S32030D Wedge compression fracture of third lumbar vertebra, subsequent encounter for fracture with routine healing: Secondary | ICD-10-CM | POA: Diagnosis not present

## 2017-09-11 DIAGNOSIS — M545 Low back pain: Secondary | ICD-10-CM | POA: Diagnosis not present

## 2017-09-11 DIAGNOSIS — I110 Hypertensive heart disease with heart failure: Secondary | ICD-10-CM | POA: Diagnosis not present

## 2017-09-11 DIAGNOSIS — Z7982 Long term (current) use of aspirin: Secondary | ICD-10-CM | POA: Diagnosis not present

## 2017-09-11 DIAGNOSIS — E119 Type 2 diabetes mellitus without complications: Secondary | ICD-10-CM | POA: Diagnosis not present

## 2017-09-13 DIAGNOSIS — I110 Hypertensive heart disease with heart failure: Secondary | ICD-10-CM | POA: Diagnosis not present

## 2017-09-13 DIAGNOSIS — I2510 Atherosclerotic heart disease of native coronary artery without angina pectoris: Secondary | ICD-10-CM | POA: Diagnosis not present

## 2017-09-13 DIAGNOSIS — I4891 Unspecified atrial fibrillation: Secondary | ICD-10-CM | POA: Diagnosis not present

## 2017-09-13 DIAGNOSIS — I5020 Unspecified systolic (congestive) heart failure: Secondary | ICD-10-CM | POA: Diagnosis not present

## 2017-09-13 DIAGNOSIS — E119 Type 2 diabetes mellitus without complications: Secondary | ICD-10-CM | POA: Diagnosis not present

## 2017-09-13 DIAGNOSIS — K219 Gastro-esophageal reflux disease without esophagitis: Secondary | ICD-10-CM | POA: Diagnosis not present

## 2017-09-13 DIAGNOSIS — M545 Low back pain: Secondary | ICD-10-CM | POA: Diagnosis not present

## 2017-09-13 DIAGNOSIS — S32030D Wedge compression fracture of third lumbar vertebra, subsequent encounter for fracture with routine healing: Secondary | ICD-10-CM | POA: Diagnosis not present

## 2017-09-13 DIAGNOSIS — Z7982 Long term (current) use of aspirin: Secondary | ICD-10-CM | POA: Diagnosis not present

## 2017-09-17 DIAGNOSIS — E119 Type 2 diabetes mellitus without complications: Secondary | ICD-10-CM | POA: Diagnosis not present

## 2017-09-17 DIAGNOSIS — I4891 Unspecified atrial fibrillation: Secondary | ICD-10-CM | POA: Diagnosis not present

## 2017-09-17 DIAGNOSIS — M545 Low back pain: Secondary | ICD-10-CM | POA: Diagnosis not present

## 2017-09-17 DIAGNOSIS — I110 Hypertensive heart disease with heart failure: Secondary | ICD-10-CM | POA: Diagnosis not present

## 2017-09-17 DIAGNOSIS — I5020 Unspecified systolic (congestive) heart failure: Secondary | ICD-10-CM | POA: Diagnosis not present

## 2017-09-17 DIAGNOSIS — Z7982 Long term (current) use of aspirin: Secondary | ICD-10-CM | POA: Diagnosis not present

## 2017-09-17 DIAGNOSIS — S32030D Wedge compression fracture of third lumbar vertebra, subsequent encounter for fracture with routine healing: Secondary | ICD-10-CM | POA: Diagnosis not present

## 2017-09-17 DIAGNOSIS — I2510 Atherosclerotic heart disease of native coronary artery without angina pectoris: Secondary | ICD-10-CM | POA: Diagnosis not present

## 2017-09-17 DIAGNOSIS — K219 Gastro-esophageal reflux disease without esophagitis: Secondary | ICD-10-CM | POA: Diagnosis not present

## 2017-09-19 DIAGNOSIS — K219 Gastro-esophageal reflux disease without esophagitis: Secondary | ICD-10-CM | POA: Diagnosis not present

## 2017-09-19 DIAGNOSIS — Z7982 Long term (current) use of aspirin: Secondary | ICD-10-CM | POA: Diagnosis not present

## 2017-09-19 DIAGNOSIS — M545 Low back pain: Secondary | ICD-10-CM | POA: Diagnosis not present

## 2017-09-19 DIAGNOSIS — I5020 Unspecified systolic (congestive) heart failure: Secondary | ICD-10-CM | POA: Diagnosis not present

## 2017-09-19 DIAGNOSIS — I4891 Unspecified atrial fibrillation: Secondary | ICD-10-CM | POA: Diagnosis not present

## 2017-09-19 DIAGNOSIS — I110 Hypertensive heart disease with heart failure: Secondary | ICD-10-CM | POA: Diagnosis not present

## 2017-09-19 DIAGNOSIS — S32030D Wedge compression fracture of third lumbar vertebra, subsequent encounter for fracture with routine healing: Secondary | ICD-10-CM | POA: Diagnosis not present

## 2017-09-19 DIAGNOSIS — E119 Type 2 diabetes mellitus without complications: Secondary | ICD-10-CM | POA: Diagnosis not present

## 2017-09-19 DIAGNOSIS — I2510 Atherosclerotic heart disease of native coronary artery without angina pectoris: Secondary | ICD-10-CM | POA: Diagnosis not present

## 2017-09-27 DIAGNOSIS — I5020 Unspecified systolic (congestive) heart failure: Secondary | ICD-10-CM | POA: Diagnosis not present

## 2017-09-27 DIAGNOSIS — S32030D Wedge compression fracture of third lumbar vertebra, subsequent encounter for fracture with routine healing: Secondary | ICD-10-CM | POA: Diagnosis not present

## 2017-09-27 DIAGNOSIS — Z7982 Long term (current) use of aspirin: Secondary | ICD-10-CM | POA: Diagnosis not present

## 2017-09-27 DIAGNOSIS — I110 Hypertensive heart disease with heart failure: Secondary | ICD-10-CM | POA: Diagnosis not present

## 2017-09-27 DIAGNOSIS — I2510 Atherosclerotic heart disease of native coronary artery without angina pectoris: Secondary | ICD-10-CM | POA: Diagnosis not present

## 2017-09-27 DIAGNOSIS — I4891 Unspecified atrial fibrillation: Secondary | ICD-10-CM | POA: Diagnosis not present

## 2017-09-27 DIAGNOSIS — M545 Low back pain: Secondary | ICD-10-CM | POA: Diagnosis not present

## 2017-09-27 DIAGNOSIS — E119 Type 2 diabetes mellitus without complications: Secondary | ICD-10-CM | POA: Diagnosis not present

## 2017-09-27 DIAGNOSIS — K219 Gastro-esophageal reflux disease without esophagitis: Secondary | ICD-10-CM | POA: Diagnosis not present

## 2017-10-14 ENCOUNTER — Ambulatory Visit: Payer: Medicare HMO | Admitting: Family Medicine

## 2017-10-14 DIAGNOSIS — H02834 Dermatochalasis of left upper eyelid: Secondary | ICD-10-CM | POA: Diagnosis not present

## 2017-10-14 DIAGNOSIS — H43813 Vitreous degeneration, bilateral: Secondary | ICD-10-CM | POA: Diagnosis not present

## 2017-10-14 DIAGNOSIS — H02831 Dermatochalasis of right upper eyelid: Secondary | ICD-10-CM | POA: Diagnosis not present

## 2017-10-14 DIAGNOSIS — E119 Type 2 diabetes mellitus without complications: Secondary | ICD-10-CM | POA: Diagnosis not present

## 2017-10-14 DIAGNOSIS — Z961 Presence of intraocular lens: Secondary | ICD-10-CM | POA: Diagnosis not present

## 2017-10-14 DIAGNOSIS — H527 Unspecified disorder of refraction: Secondary | ICD-10-CM | POA: Diagnosis not present

## 2017-10-14 LAB — HM DIABETES EYE EXAM

## 2017-10-27 NOTE — Progress Notes (Signed)
Cardiology Office Note    Date:  10/28/2017   ID:  Charlotte [name], DOB 13-Aug-1936, MRN [id number]  PCP:  [name], MD  Cardiologist:  Dr. Johnsie Cancel / Truitt Merle / Dr. Saunders Revel   No chief complaint on file.   History of Present Illness:  Charlotte [name] is a 82 y.o. female with PMH of PAF, nonobstructive CAD, chronic systolic heart failure with normalization of EF, HTN, HLD, and DM II.  She was hospitalized twice in August, first for GI bleeding and then for lumbar spine compression fracture. Office EKG obtained on 07/12/2017 showed atrial fibrillation, She underwent successful DC cardioversion on 07/16/2017 after 3 shocks, one at 150 J and twice at 200 J.  Seen by PA 07/23/17 and in NSR   No cardiac symptoms Using walker to get around Daughter lives next door and watches over her    Past Medical History:  Diagnosis Date  . Aortic insufficiency   . Atrial fibrillation ([place])    a. s/p DCCV 03/2013  . CAD (coronary artery disease)    LHC 06/2010: EF 55%, mild plaque in the LAD 20-30%, otherwise normal coronary arteries  . CATARACTS   . Chronic systolic heart failure ([place])    in setting of AFib;  Echocardiogram 02/2013: Moderate LVH, EF 25-30%, anteroseptal and apical HK, moderate AI, MAC, moderate MR, moderate LAE, mild RAE, PASP 32 => after DCCV f/u Echo 8/14: Moderate LVH, EF 60-65%, normal wall motion, grade 1 diastolic dysfunction, moderate AI, moderate LAE  . Complication of anesthesia 2009   nausea and vomitting  . GERD (gastroesophageal reflux disease)   . HYPERLIPIDEMIA, MILD   . HYPERTENSION, BENIGN SYSTEMIC   . Irritable bowel syndrome   . LBBB (left bundle branch block)   . MEDIAL EPICONDYLITIS   . NSVT (nonsustained ventricular tachycardia) ([place])    during Dob Echo 2011 => normal cath  . OSTEOARTHRITIS   . OSTEOPENIA   . PONV (postoperative nausea and vomiting)   . Pre-diabetes   . RHINITIS, ALLERGIC   . SYNCOPE   . VITAMIN D DEFICIENCY     Past Surgical  History:  Procedure Laterality Date  . CARDIOVERSION N/A 04/01/2013   Procedure: CARDIOVERSION;  Surgeon: Josue Hector, MD;  Location: [facility] ENDOSCOPY;  Service: Cardiovascular;  Laterality: N/A;  . CARDIOVERSION N/A 07/16/2017   Procedure: CARDIOVERSION;  Surgeon: Pixie Casino, MD;  Location: [place];  Service: Cardiovascular;  Laterality: N/A;  . CATARACT EXTRACTION, BILATERAL  2012   Dr. Herbert Deaner  . COLONOSCOPY WITH PROPOFOL N/A 05/23/2017   Procedure: COLONOSCOPY WITH PROPOFOL;  Surgeon: Wonda Horner, MD;  Location: [facility] ENDOSCOPY;  Service: Endoscopy;  Laterality: N/A;  . ESOPHAGOGASTRODUODENOSCOPY (EGD) WITH PROPOFOL N/A 05/23/2017   Procedure: ESOPHAGOGASTRODUODENOSCOPY (EGD) WITH PROPOFOL;  Surgeon: Wonda Horner, MD;  Location: [facility] ENDOSCOPY;  Service: Endoscopy;  Laterality: N/A;  . IR KYPHO LUMBAR INC FX REDUCE BONE BX UNI/BIL CANNULATION INC/IMAGING  06/06/2017  . IR RADIOLOGIST EVAL & MGMT  07/01/2017  . RADICAL HYSTERECTOMY    . REPLACEMENT TOTAL KNEE     11/03/07 right....... 03/29/08 left    Current Medications: Outpatient Medications Prior to Visit  Medication Sig Dispense Refill  . Acetaminophen 500 MG coapsule Take 500 mg by mouth every 6 (six) hours as needed for pain.     Marland Kitchen AMBULATORY NON FORMULARY MEDICATION Disp 1 walker for use as needed due to frequent falls and back pain 1 each 0  . Calcium Carb-Cholecalciferol (  CALCIUM 600/VITAMIN D3) 600-800 MG-UNIT TABS Take 1 tablet by mouth 2 (two) times daily.    . carvedilol (COREG) 12.5 MG tablet Take 1 tablet (12.5 mg total) by mouth 2 (two) times daily. 180 tablet 1  . Cholecalciferol (VITAMIN D PO) Take 1 tablet by mouth daily.    . furosemide (LASIX) 20 MG tablet Take 1 tablet (20 mg total) by mouth daily. 1 tablet 0  . gabapentin (NEURONTIN) 300 MG capsule TAKE 1 CAPSULE FOUR TIMES DAILY (Patient taking differently: TAKE 1 CAPSULE (300 MG) BY MOUTH THREE TIMES DAILY AS NEEDED FOR NEUROPATHY) 360 capsule 1  . Multiple  Vitamin (MULTIVITAMIN WITH MINERALS) TABS tablet Take 1 tablet by mouth daily.    . Omega-3 Fatty Acids (FISH OIL PO) Take 1 capsule by mouth daily.     . polyvinyl alcohol-povidone (REFRESH) 1.4-0.6 % ophthalmic solution Place 1-2 drops into both eyes daily as needed (dry eyes).     . potassium chloride (K-DUR,KLOR-CON) 10 MEQ tablet Take 1 tablet (10 mEq total) by mouth 2 (two) times daily. 180 tablet 3  . Psyllium (METAMUCIL PO) Take 5 mLs by mouth daily as needed (constipation). Mix in 8 oz liquid and drink    . rivaroxaban (XARELTO) 20 MG TABS tablet TAKE 20 mg TABLET EVERY DAY WITH SUPPER, start on 05/25/17 (Patient taking differently: Take 20 mg by mouth daily with supper. , start on 05/25/17) 90 tablet 3  . verapamil (VERELAN PM) 240 MG 24 hr capsule TAKE 1 CAPSULE AT BEDTIME (Patient taking differently: TAKE 1 CAPSULE (240 MG) BY MOUTH AT BEDTIME) 90 capsule 2  . vitamin B-12 (CYANOCOBALAMIN) 1000 MCG tablet Take 1,000 mcg by mouth daily.      Marland Kitchen losartan (COZAAR) 25 MG tablet Take 1 tablet (25 mg total) by mouth daily. 30 tablet 3  . cyclobenzaprine (FLEXERIL) 5 MG tablet Take 1 tablet (5 mg total) by mouth 3 (three) times daily as needed for muscle spasms. 15 tablet 0  . iron polysaccharides (NU-IRON) 150 MG capsule Take 1 capsule (150 mg total) by mouth daily. 30 capsule 3  . pantoprazole (PROTONIX) 40 MG tablet Take 1 tablet (40 mg total) by mouth 2 (two) times daily before a meal. Please take twice a day before meals for 2 WEEKS, then daily thereafter (Patient taking differently: Take 40 mg by mouth daily before lunch. ) 60 tablet 4   No facility-administered medications prior to visit.      Allergies:   Metformin and related and Amlodipine besylate-valsartan   Social History   Socioeconomic History  . Marital status: Divorced    Spouse name: None  . Number of children: 1  . Years of education: None  . Highest education level: None  Social Needs  . Financial resource strain:  None  . Food insecurity - worry: None  . Food insecurity - inability: None  . Transportation needs - medical: None  . Transportation needs - non-medical: None  Occupational History  . Occupation: Retired    Fish farm manager: RETIRED  Tobacco Use  . Smoking status: Former Smoker    Last attempt to quit: 10/08/1970    Years since quitting: 47.0  . Smokeless tobacco: Never Used  Substance and Sexual Activity  . Alcohol use: No  . Drug use: No  . Sexual activity: None  Other Topics Concern  . None  Social History Narrative  . None     Family History:  The patient's family history includes Alzheimer's disease in her mother;  Arrhythmia in her mother and sister; Colon cancer in her father; Diabetes in her sister; Heart attack (age of onset: 54) in her father.   ROS:   Please see the history of present illness.    ROS All other systems reviewed and are negative.   PHYSICAL EXAM:   VS:  BP 126/68   Pulse 63   Ht _0  (1.6 m)   Wt 249 lb (112.9 kg)   SpO2 98%   BMI 44.11 kg/m    Affect appropriate Obese white female  HEENT: normal Neck supple with no adenopathy JVP normal no bruits no thyromegaly Lungs clear with no wheezing and good diaphragmatic motion Heart:  S1/S2 no murmur, no rub, gallop or click PMI normal Abdomen: benighn, BS positve, previous hysterectomy  no bruit.  No HSM or HJR Distal pulses intact with no bruits No edema Neuro non-focal Skin warm and dry S/P left TKR   Wt Readings from Last 3 Encounters:  10/28/17 249 lb (112.9 kg)  08/26/17 252 lb (114.3 kg)  07/23/17 253 lb (114.8 kg)      Studies/Labs Reviewed:   EKG:     SR rate 60 LBBB PR 230 msec   Recent Labs: 05/21/2017: NT-Pro BNP 332 06/02/2017: TSH 2.507 06/04/2017: ALT 14 07/12/2017: Magnesium 1.9 07/13/2017: B Natriuretic Peptide 364.2; Hemoglobin 10.7; Platelets 281 08/26/2017: BUN 17; Creat 0.87; Potassium 4.0; Sodium 140   Lipid Panel    Component Value Date/Time   CHOL 193 03/01/2017  1148   TRIG 76 03/01/2017 1148   TRIG 112 06/26/2006   HDL 54 03/01/2017 1148   CHOLHDL 3.6 03/01/2017 1148   VLDL 15 03/01/2017 1148   LDLCALC 124 (H) 03/01/2017 1148    Additional studies/ records that were reviewed today include:   Echo 02/27/2016 LV EF: 55% -   60%  Study Conclusions  - Left ventricle: The cavity size was normal. There was mild focal   basal and mild concentric hypertrophy of the septum. Systolic   function was normal. The estimated ejection fraction was in the   range of 55% to 60%. Wall motion was normal; there were no   regional wall motion abnormalities. There was an increased   relative contribution of atrial contraction to ventricular   filling. Doppler parameters are consistent with abnormal left   ventricular relaxation (grade 1 diastolic dysfunction). - Ventricular septum: Septal motion showed moderate paradox. These   changes are consistent with intraventricular conduction delay. - Aortic valve: Trileaflet; normal thickness, mildly calcified   leaflets. There was mild regurgitation. - Mitral valve: Calcified annulus. Mild focal calcification of the   anterior leaflet (medial segment(s)). - Atrial septum: There was increased thickness of the septum,   consistent with lipomatous hypertrophy. - Tricuspid valve: There was trivial regurgitation.   DCCV 07/16/2017  Cardioverted 3 time(s).  Cardioverted at 150J, 200J x 2 biphasic.  Impression: Findings: Post procedure EKG shows: NSR Complications: None Patient did tolerate procedure well.  Plan: 1. Ultimately successful DCCV to NSR.   ASSESSMENT:    1. Essential hypertension   2. PAF (paroxysmal atrial fibrillation) ([place])   3. Coronary artery disease involving native coronary artery of native heart without angina pectoris   4. Hyperlipidemia, unspecified hyperlipidemia type   5. Paroxysmal atrial fibrillation ([facility])      PLAN:  1.   Obesity: discussed exercise and low carb  diet  2. Paroxysmal atrial fibrillation: Underwent DC cardioversion 07/2017 . Maintaining sinus rhythm. Continue carvedilol and verapamil. Continue Xarelto  3. CAD: Non obstructive cath 2011  No chest pain. On carvedilol, not on aspirin due to Xarelto.  4. Hypertension: Blood pressure mildly elevated, will add losartan.  5. Hyperlipidemia: Last lipid panel for month ago showed cholesterol 193, HDL 54, LDL 124, triglycerides 76. A year ago LDL was normal. Recommended diet and exercise.  6. DM 2: Will defer to primary care provider

## 2017-10-28 ENCOUNTER — Ambulatory Visit: Payer: Medicare HMO | Admitting: Cardiovascular Disease

## 2017-10-28 ENCOUNTER — Encounter: Payer: Self-pay | Admitting: Cardiovascular Disease

## 2017-10-28 VITALS — BP 126/68 | HR 63 | Ht 63.0 in | Wt 249.0 lb

## 2017-10-28 DIAGNOSIS — E785 Hyperlipidemia, unspecified: Secondary | ICD-10-CM | POA: Diagnosis not present

## 2017-10-28 DIAGNOSIS — I2510 Atherosclerotic heart disease of native coronary artery without angina pectoris: Secondary | ICD-10-CM | POA: Diagnosis not present

## 2017-10-28 DIAGNOSIS — I480 Paroxysmal atrial fibrillation: Secondary | ICD-10-CM | POA: Diagnosis not present

## 2017-10-28 DIAGNOSIS — I10 Essential (primary) hypertension: Secondary | ICD-10-CM | POA: Diagnosis not present

## 2017-10-28 MED ORDER — CARVEDILOL 12.5 MG PO TABS: 12.5000 mg | ORAL_TABLET | Freq: Two times a day (BID) | ORAL | 3 refills | Status: DC

## 2017-10-28 MED ORDER — LOSARTAN POTASSIUM 25 MG PO TABS: 25.0000 mg | ORAL_TABLET | Freq: Every day | ORAL | 3 refills | Status: DC

## 2017-10-28 MED ORDER — POTASSIUM CHLORIDE CRYS ER 10 MEQ PO TBCR: 10.0000 meq | EXTENDED_RELEASE_TABLET | Freq: Two times a day (BID) | ORAL | 3 refills | Status: DC

## 2017-10-28 NOTE — Patient Instructions (Signed)

## 2017-12-30 ENCOUNTER — Encounter: Payer: Self-pay | Admitting: Family Medicine

## 2017-12-30 ENCOUNTER — Ambulatory Visit (INDEPENDENT_AMBULATORY_CARE_PROVIDER_SITE_OTHER): Payer: Medicare HMO | Admitting: Family Medicine

## 2017-12-30 VITALS — BP 130/82 | HR 57

## 2017-12-30 DIAGNOSIS — J018 Other acute sinusitis: Secondary | ICD-10-CM | POA: Diagnosis not present

## 2017-12-30 DIAGNOSIS — E669 Obesity, unspecified: Secondary | ICD-10-CM

## 2017-12-30 DIAGNOSIS — I10 Essential (primary) hypertension: Secondary | ICD-10-CM

## 2017-12-30 DIAGNOSIS — E1169 Type 2 diabetes mellitus with other specified complication: Secondary | ICD-10-CM

## 2017-12-30 LAB — POCT GLYCOSYLATED HEMOGLOBIN (HGB A1C): HEMOGLOBIN A1C: 5.6

## 2017-12-30 MED ORDER — AMOXICILLIN-POT CLAVULANATE 875-125 MG PO TABS: 1.0000 | ORAL_TABLET | Freq: Two times a day (BID) | ORAL | 0 refills | Status: DC

## 2017-12-30 NOTE — Progress Notes (Signed)
Subjective:    CC: DM, BP  HPI:  Diabetes - no hypoglycemic events. No wounds or sores that are not healing well. No increased thirst or urination. Checking glucose at home. Taking medications as prescribed without any side effects.  Hypertension- Pt denies chest pain, SOB, dizziness, or heart palpitations.  Taking meds as directed w/o problems.  Denies medication side effects.    Cough x 2 weeks with sinus pressre and pain.  Using Sudafed.  Denies any fever sweats or chills currently.  Ports she did have a fever at the very beginning.  Been noticing some intermittent wheezing her chest particularly if she tries to lay on her left side.  No diarrhea or GI symptoms.   Past medical history, Surgical history, Family history not pertinant except as noted below, Social history, Allergies, and medications have been entered into the medical record, reviewed, and corrections made.   Review of Systems: No fevers, chills, night sweats, weight loss, chest pain, or shortness of breath.   Objective:    General: Well Developed, well nourished, and in no acute distress.  Neuro: Alert and oriented x3, extra-ocular muscles intact, sensation grossly intact.  HEENT: Normocephalic, atraumatic, oropharynx is clear, TMs and canals are clear bilaterally.  No significant cervical lymphadenopathy. Skin: Warm and dry, no rashes. Cardiac: Regular rate and rhythm, no murmurs rubs or gallops, no lower extremity edema.  Respiratory: Clear to auscultation bilaterally. Not using accessory muscles, speaking in full sentences.   Impression and Recommendations:    DM - Well controlled. Continue current regimen. Follow up in  4 months.    HTN - Well controlled. Continue current regimen. Follow up in  4 months.    Acute sinusitis-we will treat with Augmentin.  If not significantly better by the end of the week and please give [place] a call back.

## 2018-01-06 ENCOUNTER — Encounter: Payer: Self-pay | Admitting: Family Medicine

## 2018-02-26 DIAGNOSIS — I8311 Varicose veins of right lower extremity with inflammation: Secondary | ICD-10-CM | POA: Diagnosis not present

## 2018-03-12 ENCOUNTER — Other Ambulatory Visit: Payer: Self-pay | Admitting: Cardiovascular Disease

## 2018-03-13 DIAGNOSIS — I83893 Varicose veins of bilateral lower extremities with other complications: Secondary | ICD-10-CM | POA: Diagnosis not present

## 2018-03-13 DIAGNOSIS — I8312 Varicose veins of left lower extremity with inflammation: Secondary | ICD-10-CM | POA: Diagnosis not present

## 2018-03-13 DIAGNOSIS — I8311 Varicose veins of right lower extremity with inflammation: Secondary | ICD-10-CM | POA: Diagnosis not present

## 2018-03-13 DIAGNOSIS — I83813 Varicose veins of bilateral lower extremities with pain: Secondary | ICD-10-CM | POA: Diagnosis not present

## 2018-03-14 ENCOUNTER — Ambulatory Visit: Payer: Medicare HMO | Admitting: Physician Assistant

## 2018-03-19 ENCOUNTER — Other Ambulatory Visit: Payer: Self-pay | Admitting: Family Medicine

## 2018-04-09 ENCOUNTER — Ambulatory Visit (INDEPENDENT_AMBULATORY_CARE_PROVIDER_SITE_OTHER): Payer: Medicare HMO

## 2018-04-09 ENCOUNTER — Ambulatory Visit (INDEPENDENT_AMBULATORY_CARE_PROVIDER_SITE_OTHER): Payer: Medicare HMO | Admitting: Family Medicine

## 2018-04-09 ENCOUNTER — Encounter: Payer: Self-pay | Admitting: Family Medicine

## 2018-04-09 VITALS — BP 136/70 | HR 75 | Ht 63.0 in | Wt 265.0 lb

## 2018-04-09 DIAGNOSIS — M5136 Other intervertebral disc degeneration, lumbar region: Secondary | ICD-10-CM | POA: Diagnosis not present

## 2018-04-09 DIAGNOSIS — E1169 Type 2 diabetes mellitus with other specified complication: Secondary | ICD-10-CM | POA: Diagnosis not present

## 2018-04-09 DIAGNOSIS — E669 Obesity, unspecified: Secondary | ICD-10-CM

## 2018-04-09 DIAGNOSIS — M545 Low back pain, unspecified: Secondary | ICD-10-CM

## 2018-04-09 DIAGNOSIS — M25561 Pain in right knee: Secondary | ICD-10-CM

## 2018-04-09 DIAGNOSIS — I10 Essential (primary) hypertension: Secondary | ICD-10-CM

## 2018-04-09 DIAGNOSIS — Z96653 Presence of artificial knee joint, bilateral: Secondary | ICD-10-CM | POA: Diagnosis not present

## 2018-04-09 DIAGNOSIS — M25562 Pain in left knee: Secondary | ICD-10-CM

## 2018-04-09 LAB — COMPLETE METABOLIC PANEL WITH GFR
AG Ratio: 1.3 (calc) (ref 1.0–2.5)
ALBUMIN MSPROF: 3.8 g/dL (ref 3.6–5.1)
ALKALINE PHOSPHATASE (APISO): 70 U/L (ref 33–130)
ALT: 14 U/L (ref 6–29)
AST: 15 U/L (ref 10–35)
BILIRUBIN TOTAL: 0.8 mg/dL (ref 0.2–1.2)
BUN: 20 mg/dL (ref 7–25)
CHLORIDE: 107 mmol/L (ref 98–110)
CO2: 25 mmol/L (ref 20–32)
Calcium: 9.1 mg/dL (ref 8.6–10.4)
Creat: 0.75 mg/dL (ref 0.60–0.88)
GFR, Est African American: 87 mL/min/{1.73_m2} (ref >=60)
GFR, Est Non African American: 75 mL/min/{1.73_m2} (ref >=60)
GLUCOSE: 110 mg/dL — AB (ref 65–99)
Globulin: 2.9 g/dL (calc) (ref 1.9–3.7)
Potassium: 4.1 mmol/L (ref 3.5–5.3)
SODIUM: 140 mmol/L (ref 135–146)
Total Protein: 6.7 g/dL (ref 6.1–8.1)

## 2018-04-09 LAB — POCT GLYCOSYLATED HEMOGLOBIN (HGB A1C): Hemoglobin A1C: 5.6 % (ref 4.0–5.6)

## 2018-04-09 LAB — POCT UA - MICROALBUMIN
CREATININE, POC: 200 mg/dL
MICROALBUMIN (UR) POC: 80 mg/L

## 2018-04-09 NOTE — Progress Notes (Signed)
Subjective:    CC: CM, HTN  HPI:  Diabetes - no hypoglycemic events. No wounds or sores that are not healing well. No increased thirst or urination. Checking glucose at home. Taking medications as prescribed without any side effects.  Hypertension- Pt denies chest pain, SOB, dizziness, or heart palpitations.  Taking meds as directed w/o problems.  Denies medication side effects.    She also complains of low back pain particularly over the L3 area where she had a previous compression fracture.  She says is been getting progressively worse over the last couple of months.  Is concerned that she may have a new fracture.  She says she went weeks previously with pain in several x-rays that did not show anything until she finally had an MRI.  She had treatment with kyphoplasty in August 2018.  She also complains of bilateral knee pain.  She is had bilateral knee replacements and fell recently where she landed on both knees and they have been sore ever since.  She has not seen any redness or swelling externally.  She does use a walker.  She also is only had a consultation at [facility].  They recommended a supplement as well as some compression stockings.  She has been wearing them.  She has also had diarrhea but says that has been better since she restarted her gabapentin.    Past medical history, Surgical history, Family history not pertinant except as noted below, Social history, Allergies, and medications have been entered into the medical record, reviewed, and corrections made.   Review of Systems: No fevers, chills, night sweats, weight loss, chest pain, or shortness of breath.  She does report some urinary frequency after she takes her diuretic.  Objective:    General: Well Developed, well nourished, and in no acute distress.  Neuro: Alert and oriented x3, extra-ocular muscles intact, sensation grossly intact.  HEENT: Normocephalic, atraumatic  Skin: Warm and dry, no  rashes. Cardiac: Regular rate and rhythm, no murmurs rubs or gallops, no lower extremity edema.  Respiratory: Clear to auscultation bilaterally. Not using accessory muscles, speaking in full sentences. MSK: There are over the lower lumbar spine.  Also tender over the bilateral SI joints.  I did not have her bend at the waist for flexion and extension because of her instability.  No significant redness swelling or bruising over the knees.   Impression and Recommendations:    DM -well-controlled.  Hemoglobin A1c 5.6 today which is absolutely fantastic.  Foot exam performed today and urine micro.  Will up in 4 months.  HTN - Well controlled. Continue current regimen. Follow up in  3-4 months.    Low back pain-we will start with plain film x-ray.  If negative then move forward with MRI as previous films did not show fracture  Bilateral knee pain-we will also get x-rays to rule out damage to the hardware in her knees.  Set her follow-up with sports medicine if x-rays are negative.

## 2018-04-09 NOTE — Addendum Note (Signed)
Addended by: Deno Etienne on: 04/09/2018 02:26 PM   Modules accepted: Orders

## 2018-04-11 ENCOUNTER — Other Ambulatory Visit: Payer: Self-pay | Admitting: *Deleted

## 2018-04-11 DIAGNOSIS — M5136 Other intervertebral disc degeneration, lumbar region: Secondary | ICD-10-CM

## 2018-04-19 ENCOUNTER — Ambulatory Visit (HOSPITAL_BASED_OUTPATIENT_CLINIC_OR_DEPARTMENT_OTHER)
Admission: RE | Admit: 2018-04-19 | Discharge: 2018-04-19 | Disposition: A | Discharge status: Home / Self Care | Payer: Medicare HMO | Source: Ambulatory Visit | Attending: Family Medicine | Admitting: Family Medicine

## 2018-04-19 DIAGNOSIS — M4316 Spondylolisthesis, lumbar region: Secondary | ICD-10-CM | POA: Insufficient documentation

## 2018-04-19 DIAGNOSIS — M545 Low back pain: Secondary | ICD-10-CM | POA: Diagnosis not present

## 2018-04-19 DIAGNOSIS — M5136 Other intervertebral disc degeneration, lumbar region: Secondary | ICD-10-CM | POA: Diagnosis not present

## 2018-04-19 DIAGNOSIS — M5126 Other intervertebral disc displacement, lumbar region: Secondary | ICD-10-CM | POA: Insufficient documentation

## 2018-04-22 ENCOUNTER — Other Ambulatory Visit: Payer: Self-pay

## 2018-04-22 DIAGNOSIS — M5136 Other intervertebral disc degeneration, lumbar region: Secondary | ICD-10-CM

## 2018-04-22 DIAGNOSIS — R937 Abnormal findings on diagnostic imaging of other parts of musculoskeletal system: Secondary | ICD-10-CM

## 2018-05-01 DIAGNOSIS — I83893 Varicose veins of bilateral lower extremities with other complications: Secondary | ICD-10-CM | POA: Diagnosis not present

## 2018-05-01 DIAGNOSIS — I8311 Varicose veins of right lower extremity with inflammation: Secondary | ICD-10-CM | POA: Diagnosis not present

## 2018-05-01 DIAGNOSIS — I83813 Varicose veins of bilateral lower extremities with pain: Secondary | ICD-10-CM | POA: Diagnosis not present

## 2018-05-01 DIAGNOSIS — I8312 Varicose veins of left lower extremity with inflammation: Secondary | ICD-10-CM | POA: Diagnosis not present

## 2018-05-09 ENCOUNTER — Ambulatory Visit: Payer: Medicare HMO | Admitting: Cardiology

## 2018-05-09 ENCOUNTER — Encounter: Payer: Self-pay | Admitting: Cardiology

## 2018-05-09 VITALS — BP 130/90 | HR 98 | Ht 63.0 in | Wt 264.8 lb

## 2018-05-09 DIAGNOSIS — I4891 Unspecified atrial fibrillation: Secondary | ICD-10-CM | POA: Diagnosis not present

## 2018-05-09 DIAGNOSIS — I2510 Atherosclerotic heart disease of native coronary artery without angina pectoris: Secondary | ICD-10-CM | POA: Diagnosis not present

## 2018-05-09 DIAGNOSIS — Z7901 Long term (current) use of anticoagulants: Secondary | ICD-10-CM | POA: Diagnosis not present

## 2018-05-09 DIAGNOSIS — I10 Essential (primary) hypertension: Secondary | ICD-10-CM

## 2018-05-09 DIAGNOSIS — R0602 Shortness of breath: Secondary | ICD-10-CM

## 2018-05-09 DIAGNOSIS — E119 Type 2 diabetes mellitus without complications: Secondary | ICD-10-CM | POA: Diagnosis not present

## 2018-05-09 NOTE — Patient Instructions (Addendum)
Medication Instructions:  Your physician recommends that you continue on your current medications as directed. Please refer to the Current Medication list given to you today.  TAKE AN EXTRA LASIX 20 MG DAILY IF NEEDED FOR INCREASE SHORTNESS OF BREATH.   Labwork: TODAY: BMET, CBC  Testing/Procedures: None ordered  Follow-Up: WE WILL CALL YOU ABOUT FOLLOW UP.  Any Other Special Instructions Will Be Listed Below (If Applicable).     If you need a refill on your cardiac medications before your next appointment, please call your pharmacy.

## 2018-05-09 NOTE — Progress Notes (Signed)
Cardiology Office Note   Date:  05/09/2018   ID:  [name], DOB April 26, 1936, MRN [id number]  PCP:  [name], MD  Cardiologist:  Dr. Johnsie Cancel     Chief Complaint  Patient presents with  . Atrial Fibrillation    new SOB      History of Present Illness: Charlotte [name] is a 82 y.o. female who presents for her a fib.     PMH of PAF, nonobstructive CAD, chronic systolic heart failure with normalization of EF, HTN, HLD, and DM II.  She was hospitalized twice in August, first for GI bleeding and then for lumbar spine compression fracture. Office EKG obtained on 07/12/2017 showed atrial fibrillation, She underwent successful DC cardioversion on 07/16/2017 after 3 shocks, one at 150 J and twice at 200 J.  Seen by PA 07/23/17 and in NSR 10/28/17 was in SR,   Losartan was added for hypertension.    Today pt feels well without chest pain but she has developed SOB.  She is worried she is a fib.  She has been staying in her hous due to SOB and thinking it may be the heat.  She has no bleeding on xarelto.    Past Medical History:  Diagnosis Date  . Aortic insufficiency   . Atrial fibrillation ([place])    a. s/p DCCV 03/2013  . CAD (coronary artery disease)    LHC 06/2010: EF 55%, mild plaque in the LAD 20-30%, otherwise normal coronary arteries  . CATARACTS   . Chronic systolic heart failure ([place])    in setting of AFib;  Echocardiogram 02/2013: Moderate LVH, EF 25-30%, anteroseptal and apical HK, moderate AI, MAC, moderate MR, moderate LAE, mild RAE, PASP 32 => after DCCV f/u Echo 8/14: Moderate LVH, EF 60-65%, normal wall motion, grade 1 diastolic dysfunction, moderate AI, moderate LAE  . Complication of anesthesia 2009   nausea and vomitting  . GERD (gastroesophageal reflux disease)   . HYPERLIPIDEMIA, MILD   . HYPERTENSION, BENIGN SYSTEMIC   . Irritable bowel syndrome   . LBBB (left bundle branch block)   . MEDIAL EPICONDYLITIS   . NSVT (nonsustained ventricular tachycardia) ([place])     during Dob Echo 2011 => normal cath  . OSTEOARTHRITIS   . OSTEOPENIA   . PONV (postoperative nausea and vomiting)   . Pre-diabetes   . RHINITIS, ALLERGIC   . SYNCOPE   . VITAMIN D DEFICIENCY     Past Surgical History:  Procedure Laterality Date  . CARDIOVERSION N/A 04/01/2013   Procedure: CARDIOVERSION;  Surgeon: Josue Hector, MD;  Location: [facility] ENDOSCOPY;  Service: Cardiovascular;  Laterality: N/A;  . CARDIOVERSION N/A 07/16/2017   Procedure: CARDIOVERSION;  Surgeon: Pixie Casino, MD;  Location: [place];  Service: Cardiovascular;  Laterality: N/A;  . CATARACT EXTRACTION, BILATERAL  2012   Dr. Herbert Deaner  . COLONOSCOPY WITH PROPOFOL N/A 05/23/2017   Procedure: COLONOSCOPY WITH PROPOFOL;  Surgeon: Wonda Horner, MD;  Location[facility] ENDOSCOPY;  Service: Endoscopy;  Laterality: N/A;  . ESOPHAGOGASTRODUODENOSCOPY (EGD) WITH PROPOFOL N/A 05/23/2017   Procedure: ESOPHAGOGASTRODUODENOSCOPY (EGD) WITH PROPOFOL;  Surgeon: Wonda Horner, MD;  Location: [facility] ENDOSCOPY;  Service: Endoscopy;  Laterality: N/A;  . IR KYPHO LUMBAR INC FX REDUCE BONE BX UNI/BIL CANNULATION INC/IMAGING  06/06/2017  . IR RADIOLOGIST EVAL & MGMT  07/01/2017  . RADICAL HYSTERECTOMY    . REPLACEMENT TOTAL KNEE     11/03/07 right....... 03/29/08 left     Current Outpatient Medications  Medication Sig Dispense Refill  . Acetaminophen 500 MG coapsule Take 500 mg by mouth every 6 (six) hours as needed for pain.     . Calcium Carb-Cholecalciferol (CALCIUM 600/VITAMIN D3) 600-800 MG-UNIT TABS Take 1 tablet by mouth 2 (two) times daily.    . Cholecalciferol (VITAMIN D PO) Take 1 tablet by mouth daily.    . furosemide (LASIX) 20 MG tablet Take 1 tablet (20 mg total) by mouth daily. 1 tablet 0  . gabapentin (NEURONTIN) 300 MG capsule TAKE 1 CAPSULE FOUR TIMES DAILY 360 capsule 1  . Multiple Vitamin (MULTIVITAMIN WITH MINERALS) TABS tablet Take 1 tablet by mouth daily.    . Omega-3 Fatty Acids (FISH OIL PO) Take 1 capsule by  mouth daily.     . polyvinyl alcohol-povidone (REFRESH) 1.4-0.6 % ophthalmic solution Place 1-2 drops into both eyes daily as needed (dry eyes).     . potassium chloride (K-DUR,KLOR-CON) 10 MEQ tablet Take 1 tablet (10 mEq total) by mouth 2 (two) times daily. 180 tablet 3  . Psyllium (METAMUCIL PO) Take 5 mLs by mouth daily as needed (constipation). Mix in 8 oz liquid and drink    . rivaroxaban (XARELTO) 20 MG TABS tablet TAKE 20 mg TABLET EVERY DAY WITH SUPPER, start on 05/25/17 (Patient taking differently: Take 20 mg by mouth daily with supper. , start on 05/25/17) 90 tablet 3  . verapamil (VERELAN PM) 240 MG 24 hr capsule TAKE 1 CAPSULE AT BEDTIME 90 capsule 1  . vitamin B-12 (CYANOCOBALAMIN) 1000 MCG tablet Take 1,000 mcg by mouth daily.      . carvedilol (COREG) 12.5 MG tablet Take 1 tablet (12.5 mg total) by mouth 2 (two) times daily. 180 tablet 3  . losartan (COZAAR) 25 MG tablet Take 1 tablet (25 mg total) by mouth daily. 90 tablet 3   No current facility-administered medications for this visit.     Allergies:   Amlodipine; Metformin and related; and Amlodipine besylate-valsartan    Social History:  The patient  reports that she quit smoking about 47 years ago. She has never used smokeless tobacco. She reports that she does not drink alcohol or use drugs.   Family History:  The patient's family history includes Alzheimer's disease in her mother; Arrhythmia in her mother and sister; Colon cancer in her father; Diabetes in her sister; Heart attack (age of onset: 38) in her father.    ROS:  General:no colds or fevers, + weight gain Skin:no rashes or ulcers HEENT:no blurred vision, no congestion CV:see HPI PUL:see HPI GI:no diarrhea constipation or melena, no indigestion GU:no hematuria, no dysuria MS:no joint pain, no claudication Neuro:no syncope, no lightheadedness Endo:no diabetes, no thyroid disease  Wt Readings from Last 3 Encounters:  05/09/18 264 lb 12.8 oz (120.1 kg)    04/09/18 265 lb (120.2 kg)  10/28/17 249 lb (112.9 kg)     PHYSICAL EXAM: VS:  BP 130/90   Pulse 98   Ht 5' 3"  (1.6 m)   Wt 264 lb 12.8 oz (120.1 kg)   SpO2 98%   BMI 46.91 kg/m  , BMI Body mass index is 46.91 kg/m. General:Pleasant affect, NAD Skin:Warm and dry, brisk capillary refill HEENT:normocephalic, sclera clear, mucus membranes moist Neck:supple, no JVD, no bruits  Heart:muffled but irreg irreg  without murmur, gallup, rub or click Lungs:clear without rales, rhonchi, or wheezes KWI:OXBD, non tender, + BS, do not palpate liver spleen or masses Ext:no to tr lower ext edema, 2+ pedal pulses, 2+ radial pulses  Neuro:alert and oriented X 3, MAE, follows commands, + facial symmetry    EKG:  EKG is ordered today. The ekg ordered today demonstrates A fib rate controlled at 81 with LAD, LBBB and PVCs occ.     Recent Labs: 05/21/2017: NT-Pro BNP 332 06/02/2017: TSH 2.507 07/12/2017: Magnesium 1.9 07/13/2017: B Natriuretic Peptide 364.2; Hemoglobin 10.7; Platelets 281 04/09/2018: ALT 14; BUN 20; Creat 0.75; Potassium 4.1; Sodium 140    Lipid Panel    Component Value Date/Time   CHOL 193 03/01/2017 1148   TRIG 76 03/01/2017 1148   TRIG 112 06/26/2006   HDL 54 03/01/2017 1148   CHOLHDL 3.6 03/01/2017 1148   VLDL 15 03/01/2017 1148   LDLCALC 124 (H) 03/01/2017 1148       Other studies Reviewed: Additional studies/ records that were reviewed today include:  ECHO.02/27/16 Study Conclusions  - Left ventricle: The cavity size was normal. There was mild focal   basal and mild concentric hypertrophy of the septum. Systolic   function was normal. The estimated ejection fraction was in the   range of 55% to 60%. Wall motion was normal; there were no   regional wall motion abnormalities. There was an increased   relative contribution of atrial contraction to ventricular   filling. Doppler parameters are consistent with abnormal left   ventricular relaxation (grade 1  diastolic dysfunction). - Ventricular septum: Septal motion showed moderate paradox. These   changes are consistent with intraventricular conduction delay. - Aortic valve: Trileaflet; normal thickness, mildly calcified   leaflets. There was mild regurgitation. - Mitral valve: Calcified annulus. Mild focal calcification of the   anterior leaflet (medial segment(s)). - Atrial septum: There was increased thickness of the septum,   consistent with lipomatous hypertrophy. - Tricuspid valve: There was trivial regurgitation.   ASSESSMENT AND PLAN:  1.  Recurrent a fib with rate control and associated SOB,  Discussed rate is controlled and to be consistent with xarelto to prevent CVA.  Will check with Dr. Johnsie Cancel to see if we should refer to a fib clinic, or medication adjustment and DCCV.  She is on coreg, verapamil for the a fib.  May need antiarrythmic.     2.  SOB she can take an extra 20 mg lasix every other day for increased SOB.  Will check bmet today.  Also CBC.   3.  HTN conrolled   4.  CAD non obstructive in in 2011.  Not on asa due to xarelto.   5.  HLD stable  6.   DM-2 per PCP.      Current medicines are reviewed with the patient today.  The patient Has no concerns regarding medicines.  The following changes have been made:  See above Labs/ tests ordered today include:see above  Disposition:   FU:  see above  Signed, Cecilie Kicks, NP  05/09/2018 4:36 PM    [facility] [place], [place], [place] Phone: [phone]; Fax: [phone]

## 2018-05-10 LAB — CBC
HEMATOCRIT: 42.9 % (ref 34.0–46.6)
Hemoglobin: 14.2 g/dL (ref 11.1–15.9)
MCH: 30.6 pg (ref 26.6–33.0)
MCHC: 33.1 g/dL (ref 31.5–35.7)
MCV: 93 fL (ref 79–97)
Platelets: 217 10*3/uL (ref 150–450)
RBC: 4.64 x10E6/uL (ref 3.77–5.28)
RDW: 14.1 % (ref 12.3–15.4)
WBC: 6.6 10*3/uL (ref 3.4–10.8)

## 2018-05-10 LAB — BASIC METABOLIC PANEL
BUN / CREAT RATIO: 29 — AB (ref 12–28)
BUN: 30 mg/dL — AB (ref 8–27)
CO2: 22 mmol/L (ref 20–29)
CREATININE: 1.04 mg/dL — AB (ref 0.57–1.00)
Calcium: 9.5 mg/dL (ref 8.7–10.3)
Chloride: 107 mmol/L — ABNORMAL HIGH (ref 96–106)
GFR calc Af Amer: 59 mL/min/{1.73_m2} — ABNORMAL LOW (ref >59)
GFR calc non Af Amer: 51 mL/min/{1.73_m2} — ABNORMAL LOW (ref >59)
Glucose: 97 mg/dL (ref 65–99)
Potassium: 4.5 mmol/L (ref 3.5–5.2)
Sodium: 142 mmol/L (ref 134–144)

## 2018-05-12 ENCOUNTER — Telehealth: Payer: Self-pay

## 2018-05-12 DIAGNOSIS — R0602 Shortness of breath: Secondary | ICD-10-CM

## 2018-05-12 NOTE — Telephone Encounter (Signed)
Called patient, no answer will try again later.

## 2018-05-12 NOTE — Telephone Encounter (Signed)
-----   Message from Wendall Stade, MD sent at 05/12/2018  2:08 PM EDT ----- Have her get updated echo for EF last one 2017 and lexiscan myovue to r/o CAD Then have her see afib clinic to start AAT  ----- Message ----- From: Ethelda Chick, RN Sent: 05/12/2018   1:24 PM To: Wendall Stade, MD  Did you see CC'd chart on this patient from Vernona Rieger?  ----- Message ----- From: Leone Brand, NP Sent: 05/12/2018  12:17 PM To: Ethelda Chick, RN  Please check with Dr. Eden Emms on pt"s recurrent a fib, rate controlled but SOB  Should we send to a fib clinic?

## 2018-05-15 ENCOUNTER — Telehealth (HOSPITAL_COMMUNITY): Payer: Self-pay | Admitting: *Deleted

## 2018-05-15 ENCOUNTER — Ambulatory Visit (HOSPITAL_COMMUNITY): Payer: Medicare HMO | Attending: Cardiovascular Disease

## 2018-05-15 ENCOUNTER — Other Ambulatory Visit: Payer: Self-pay

## 2018-05-15 DIAGNOSIS — I77810 Thoracic aortic ectasia: Secondary | ICD-10-CM | POA: Insufficient documentation

## 2018-05-15 DIAGNOSIS — I083 Combined rheumatic disorders of mitral, aortic and tricuspid valves: Secondary | ICD-10-CM | POA: Diagnosis not present

## 2018-05-15 DIAGNOSIS — E119 Type 2 diabetes mellitus without complications: Secondary | ICD-10-CM | POA: Insufficient documentation

## 2018-05-15 DIAGNOSIS — Z6841 Body Mass Index (BMI) 40.0 and over, adult: Secondary | ICD-10-CM | POA: Insufficient documentation

## 2018-05-15 DIAGNOSIS — R0602 Shortness of breath: Secondary | ICD-10-CM | POA: Diagnosis not present

## 2018-05-15 DIAGNOSIS — D649 Anemia, unspecified: Secondary | ICD-10-CM | POA: Diagnosis not present

## 2018-05-15 DIAGNOSIS — Z87891 Personal history of nicotine dependence: Secondary | ICD-10-CM | POA: Diagnosis not present

## 2018-05-15 DIAGNOSIS — I119 Hypertensive heart disease without heart failure: Secondary | ICD-10-CM | POA: Diagnosis not present

## 2018-05-15 DIAGNOSIS — I428 Other cardiomyopathies: Secondary | ICD-10-CM | POA: Insufficient documentation

## 2018-05-15 DIAGNOSIS — I4891 Unspecified atrial fibrillation: Secondary | ICD-10-CM | POA: Insufficient documentation

## 2018-05-15 DIAGNOSIS — E785 Hyperlipidemia, unspecified: Secondary | ICD-10-CM | POA: Insufficient documentation

## 2018-05-15 DIAGNOSIS — I472 Ventricular tachycardia: Secondary | ICD-10-CM | POA: Diagnosis not present

## 2018-05-15 NOTE — Telephone Encounter (Signed)
Patient given detailed instructions per Myocardial Perfusion Study Information Sheet for the test on 05/19/18. Patient notified to arrive 15 minutes early and that it is imperative to arrive on time for appointment to keep from having the test rescheduled.  If you need to cancel or reschedule your appointment, please call the office within 24 hours of your appointment. . Patient verbalized understanding. Ricky Ala, RN

## 2018-05-16 NOTE — Telephone Encounter (Signed)
Left message for patient to call back. Patient will need to have echo results given to her when she calls back.

## 2018-05-16 NOTE — Telephone Encounter (Signed)
Patient called back for echo results. Made patient an appointment with A. FIB clinic on 05/21/18.

## 2018-05-19 ENCOUNTER — Encounter (HOSPITAL_COMMUNITY): Payer: Medicare HMO | Attending: Cardiovascular Disease

## 2018-05-19 DIAGNOSIS — R0602 Shortness of breath: Secondary | ICD-10-CM | POA: Insufficient documentation

## 2018-05-19 MED ORDER — TECHNETIUM TC 99M TETROFOSMIN IV KIT
31.5000 | PACK | Freq: Once | INTRAVENOUS | Status: AC | PRN
  Administered 2018-05-19: 31.5 via INTRAVENOUS
  Filled 2018-05-19: qty 32

## 2018-05-19 MED ORDER — REGADENOSON 0.4 MG/5ML IV SOLN
0.4000 mg | Freq: Once | INTRAVENOUS | Status: AC
  Administered 2018-05-19: 0.4 mg via INTRAVENOUS

## 2018-05-21 ENCOUNTER — Encounter (HOSPITAL_COMMUNITY): Payer: Self-pay | Admitting: Nurse Practitioner

## 2018-05-21 ENCOUNTER — Ambulatory Visit (HOSPITAL_COMMUNITY)
Admission: RE | Admit: 2018-05-21 | Discharge: 2018-05-21 | Disposition: A | Discharge status: Home / Self Care | Payer: Medicare HMO | Source: Ambulatory Visit | Attending: Nurse Practitioner | Admitting: Nurse Practitioner

## 2018-05-21 ENCOUNTER — Other Ambulatory Visit: Payer: Self-pay

## 2018-05-21 VITALS — BP 116/74 | HR 69 | Ht 63.0 in | Wt 268.6 lb

## 2018-05-21 DIAGNOSIS — Z9841 Cataract extraction status, right eye: Secondary | ICD-10-CM | POA: Insufficient documentation

## 2018-05-21 DIAGNOSIS — I5022 Chronic systolic (congestive) heart failure: Secondary | ICD-10-CM | POA: Diagnosis not present

## 2018-05-21 DIAGNOSIS — I4891 Unspecified atrial fibrillation: Secondary | ICD-10-CM | POA: Diagnosis not present

## 2018-05-21 DIAGNOSIS — E785 Hyperlipidemia, unspecified: Secondary | ICD-10-CM | POA: Diagnosis not present

## 2018-05-21 DIAGNOSIS — Z833 Family history of diabetes mellitus: Secondary | ICD-10-CM | POA: Insufficient documentation

## 2018-05-21 DIAGNOSIS — I2510 Atherosclerotic heart disease of native coronary artery without angina pectoris: Secondary | ICD-10-CM | POA: Diagnosis not present

## 2018-05-21 DIAGNOSIS — Z800 Family history of malignant neoplasm of digestive organs: Secondary | ICD-10-CM | POA: Diagnosis not present

## 2018-05-21 DIAGNOSIS — Z9889 Other specified postprocedural states: Secondary | ICD-10-CM | POA: Diagnosis not present

## 2018-05-21 DIAGNOSIS — Z7901 Long term (current) use of anticoagulants: Secondary | ICD-10-CM | POA: Insufficient documentation

## 2018-05-21 DIAGNOSIS — I110 Hypertensive heart disease with heart failure: Secondary | ICD-10-CM | POA: Insufficient documentation

## 2018-05-21 DIAGNOSIS — Z90710 Acquired absence of both cervix and uterus: Secondary | ICD-10-CM | POA: Insufficient documentation

## 2018-05-21 DIAGNOSIS — I481 Persistent atrial fibrillation: Secondary | ICD-10-CM | POA: Insufficient documentation

## 2018-05-21 DIAGNOSIS — Z9842 Cataract extraction status, left eye: Secondary | ICD-10-CM | POA: Insufficient documentation

## 2018-05-21 DIAGNOSIS — Z87891 Personal history of nicotine dependence: Secondary | ICD-10-CM | POA: Diagnosis not present

## 2018-05-21 DIAGNOSIS — Z888 Allergy status to other drugs, medicaments and biological substances status: Secondary | ICD-10-CM | POA: Insufficient documentation

## 2018-05-21 DIAGNOSIS — Z79899 Other long term (current) drug therapy: Secondary | ICD-10-CM | POA: Diagnosis not present

## 2018-05-21 NOTE — Progress Notes (Signed)
Primary Care Physician: [name], MD Referring Physician: Dr. Levander Campion Till is a 82 y.o. female  with h/oPAF, nonobstructive CAD, chronic systolic heart failure with normalization of EF, HTN, HLD, and DM II. She was hospitalized twice in August,2018, first for GI bleeding and then for lumbar spine compression fracture. Cardiology F/U EKG obtained on 07/12/2017 showed atrial fibrillation, Sheunderwent successful DC cardioversion on 07/16/2017 after 3 shocks, one at 150 J and twice at 200 J. She was seen by Cecilie Kicks, NP 05/2018 and was found back in afib and discussed with Dr. Johnsie Cancel. She was set up for echo and stress test which will completed later this week. She is in afib clinic to further antiarrythmic therapy. She is tired and short of breath with afib.  Today, she denies symptoms of palpitations, chest pain, shortness of breath, orthopnea, PND, lower extremity edema, dizziness, presyncope, syncope, or neurologic sequela. The patient is tolerating medications without difficulties and is otherwise without complaint today.   Past Medical History:  Diagnosis Date  . Aortic insufficiency   . Atrial fibrillation ([place])    a. s/p DCCV 03/2013  . CAD (coronary artery disease)    LHC 06/2010: EF 55%, mild plaque in the LAD 20-30%, otherwise normal coronary arteries  . CATARACTS   . Chronic systolic heart failure ([place])    in setting of AFib;  Echocardiogram 02/2013: Moderate LVH, EF 25-30%, anteroseptal and apical HK, moderate AI, MAC, moderate MR, moderate LAE, mild RAE, PASP 32 => after DCCV f/u Echo 8/14: Moderate LVH, EF 60-65%, normal wall motion, grade 1 diastolic dysfunction, moderate AI, moderate LAE  . Complication of anesthesia 2009   nausea and vomitting  . GERD (gastroesophageal reflux disease)   . HYPERLIPIDEMIA, MILD   . HYPERTENSION, BENIGN SYSTEMIC   . Irritable bowel syndrome   . LBBB (left bundle branch block)   . MEDIAL EPICONDYLITIS   . NSVT  (nonsustained ventricular tachycardia) ([place])    during Dob Echo 2011 => normal cath  . OSTEOARTHRITIS   . OSTEOPENIA   . PONV (postoperative nausea and vomiting)   . Pre-diabetes   . RHINITIS, ALLERGIC   . SYNCOPE   . VITAMIN D DEFICIENCY    Past Surgical History:  Procedure Laterality Date  . CARDIOVERSION N/A 04/01/2013   Procedure: CARDIOVERSION;  Surgeon: Josue Hector, MD;  Location: [facility] ENDOSCOPY;  Service: Cardiovascular;  Laterality: N/A;  . CARDIOVERSION N/A 07/16/2017   Procedure: CARDIOVERSION;  Surgeon: Pixie Casino, MD;  Location: [place];  Service: Cardiovascular;  Laterality: N/A;  . CATARACT EXTRACTION, BILATERAL  2012   Dr. Herbert Deaner  . COLONOSCOPY WITH PROPOFOL N/A 05/23/2017   Procedure: COLONOSCOPY WITH PROPOFOL;  Surgeon: Wonda Horner, MD;  Location: [facility] ENDOSCOPY;  Service: Endoscopy;  Laterality: N/A;  . ESOPHAGOGASTRODUODENOSCOPY (EGD) WITH PROPOFOL N/A 05/23/2017   Procedure: ESOPHAGOGASTRODUODENOSCOPY (EGD) WITH PROPOFOL;  Surgeon: Wonda Horner, MD;  Location: [facility] ENDOSCOPY;  Service: Endoscopy;  Laterality: N/A;  . IR KYPHO LUMBAR INC FX REDUCE BONE BX UNI/BIL CANNULATION INC/IMAGING  06/06/2017  . IR RADIOLOGIST EVAL & MGMT  07/01/2017  . RADICAL HYSTERECTOMY    . REPLACEMENT TOTAL KNEE     11/03/07 right....... 03/29/08 left    Current Outpatient Medications  Medication Sig Dispense Refill  . Acetaminophen 500 MG coapsule Take 500 mg by mouth every 6 (six) hours as needed for pain.     . Calcium Carb-Cholecalciferol (CALCIUM 600/VITAMIN D3) 600-800 MG-UNIT TABS Take  1 tablet by mouth 2 (two) times daily.    . carvedilol (COREG) 12.5 MG tablet Take 1 tablet (12.5 mg total) by mouth 2 (two) times daily. 180 tablet 3  . Cholecalciferol (VITAMIN D PO) Take 1 tablet by mouth daily.    . furosemide (LASIX) 20 MG tablet Take 1 tablet (20 mg total) by mouth daily. (Patient taking differently: Take 40 mg by mouth daily. ) 1 tablet 0  . gabapentin (NEURONTIN)  300 MG capsule TAKE 1 CAPSULE FOUR TIMES DAILY 360 capsule 1  . losartan (COZAAR) 25 MG tablet Take 1 tablet (25 mg total) by mouth daily. 90 tablet 3  . Multiple Vitamin (MULTIVITAMIN WITH MINERALS) TABS tablet Take 1 tablet by mouth daily.    . polyvinyl alcohol-povidone (REFRESH) 1.4-0.6 % ophthalmic solution Place 1-2 drops into both eyes daily as needed (dry eyes).     . potassium chloride (K-DUR,KLOR-CON) 10 MEQ tablet Take 1 tablet (10 mEq total) by mouth 2 (two) times daily. 180 tablet 3  . Psyllium (METAMUCIL PO) Take 5 mLs by mouth daily as needed (constipation). Mix in 8 oz liquid and drink    . rivaroxaban (XARELTO) 20 MG TABS tablet TAKE 20 mg TABLET EVERY DAY WITH SUPPER, start on 05/25/17 (Patient taking differently: Take 20 mg by mouth daily with supper. , start on 05/25/17) 90 tablet 3  . verapamil (VERELAN PM) 240 MG 24 hr capsule TAKE 1 CAPSULE AT BEDTIME 90 capsule 1  . vitamin B-12 (CYANOCOBALAMIN) 1000 MCG tablet Take 1,000 mcg by mouth daily.      . Omega-3 Fatty Acids (FISH OIL PO) Take 1 capsule by mouth daily.      No current facility-administered medications for this encounter.     Allergies  Allergen Reactions  . Amlodipine     Other reaction(s): HEADACHE  . Metformin And Related Other (See Comments)    Renal failure  . Amlodipine Besylate-Valsartan Other (See Comments)    headache    Social History   Socioeconomic History  . Marital status: Divorced    Spouse name: Not on file  . Number of children: 1  . Years of education: Not on file  . Highest education level: Not on file  Occupational History  . Occupation: Retired    Fish farm manager: RETIRED  Social Needs  . Financial resource strain: Not on file  . Food insecurity:    Worry: Not on file    Inability: Not on file  . Transportation needs:    Medical: Not on file    Non-medical: Not on file  Tobacco Use  . Smoking status: Former Smoker    Last attempt to quit: 10/08/1970    Years since quitting: 47.6   . Smokeless tobacco: Never Used  Substance and Sexual Activity  . Alcohol use: No  . Drug use: No  . Sexual activity: Not on file  Lifestyle  . Physical activity:    Days per week: Not on file    Minutes per session: Not on file  . Stress: Not on file  Relationships  . Social connections:    Talks on phone: Not on file    Gets together: Not on file    Attends religious service: Not on file    Active member of club or organization: Not on file    Attends meetings of clubs or organizations: Not on file    Relationship status: Not on file  . Intimate partner violence:    Fear of current or  ex partner: Not on file    Emotionally abused: Not on file    Physically abused: Not on file    Forced sexual activity: Not on file  Other Topics Concern  . Not on file  Social History Narrative  . Not on file    Family History  Problem Relation Age of Onset  . Arrhythmia Mother        afib  . Alzheimer's disease Mother   . Colon cancer Father   . Heart attack Father 33  . Diabetes Sister   . Arrhythmia Sister        afib    ROS- All systems are reviewed and negative except as per the HPI above  Physical Exam: Vitals:   05/21/18 1059  BP: 116/74  Pulse: 69  Weight: 121.8 kg  Height: _0  (1.6 m)   Wt Readings from Last 3 Encounters:  05/21/18 121.8 kg  05/19/18 119.7 kg  05/09/18 120.1 kg    Labs: Lab Results  Component Value Date   NA 142 05/09/2018   K 4.5 05/09/2018   CL 107 (H) 05/09/2018   CO2 22 05/09/2018   GLUCOSE 97 05/09/2018   BUN 30 (H) 05/09/2018   CREATININE 1.04 (H) 05/09/2018   CALCIUM 9.5 05/09/2018   PHOS 4.2 06/02/2017   MG 1.9 07/12/2017   Lab Results  Component Value Date   INR 1.01 06/06/2017   Lab Results  Component Value Date   CHOL 193 03/01/2017   HDL 54 03/01/2017   LDLCALC 124 (H) 03/01/2017   TRIG 76 03/01/2017     GEN- The patient is well appearing, alert and oriented x 3 today.   Head- normocephalic,  atraumatic Eyes-  Sclera clear, conjunctiva pink Ears- hearing intact Oropharynx- clear Neck- supple, no JVP Lymph- no cervical lymphadenopathy Lungs- Clear to ausculation bilaterally, normal work of breathing Heart- irregular rate and rhythm, no murmurs, rubs or gallops, PMI not laterally displaced GI- soft, NT, ND, + BS Extremities- no clubbing, cyanosis, or edema MS- no significant deformity or atrophy Skin- no rash or lesion Psych- euthymic mood, full affect Neuro- strength and sensation are intact  EKG-afib at 69 bpm Echo-Study Conclusions  - Left ventricle: The cavity size was normal. There was mild focal   basal hypertrophy of the septum. Systolic function was mildly   reduced. The estimated ejection fraction was in the range of 45%   to 50%. Wall motion was normal; there were no regional wall   motion abnormalities. - Aortic valve: Transvalvular velocity was within the normal range.   There was no stenosis. There was mild to moderate regurgitation. - Aorta: Ascending aortic diameter: 37.43 mm (S). - Ascending aorta: The ascending aorta was mildly dilated. - Mitral valve: Transvalvular velocity was within the normal range.   There was no evidence for stenosis. There was mild regurgitation. - Left atrium: The atrium was mildly dilated. - Right ventricle: The cavity size was normal. Wall thickness was   normal. Systolic function was normal. - Atrial septum: No defect or patent foramen ovale was identified   by color flow Doppler. - Tricuspid valve: There was mild regurgitation. - Pulmonary arteries: Systolic pressure was within the normal   range. PA peak pressure: 33 mm Hg (S).  Impressions:  - Compared with the echo 0/7371, systolic function is reduced.   Assessment and Plan: 1. Persistent  afib Discussed with pt and daughter antiarrythmic therapy to restore SR Since she has reduced EF on echo and  a LBBB, do not feel flecainide or multaq are appropriate options   Discussed amiodarone, tikosyn and sotalol in great detail Pt also has the second part of her stress test pending tomorrow Pt and daughter want  more time to consider the options, plus it will be helpful to get results of the stress test first, to make sure that are not issues there that need addressing first  F/u here in one week   Butch Penny C. Obinna Ehresman, [facility] [address] [place], [place] [address] [phone]

## 2018-05-22 ENCOUNTER — Ambulatory Visit (HOSPITAL_COMMUNITY): Payer: Medicare HMO | Attending: Cardiology

## 2018-05-22 LAB — MYOCARDIAL PERFUSION IMAGING
CHL CUP NUCLEAR SDS: 2
LHR: 0.37
LV dias vol: 112 mL (ref 46–106)
LVSYSVOL: 58 mL
Peak HR: 85 {beats}/min
Rest HR: 71 {beats}/min
SRS: 7
SSS: 9
TID: 0.88

## 2018-05-22 MED ORDER — TECHNETIUM TC 99M TETROFOSMIN IV KIT
30.6000 | PACK | Freq: Once | INTRAVENOUS | Status: AC | PRN
  Administered 2018-05-22: 30.6 via INTRAVENOUS
  Filled 2018-05-22: qty 31

## 2018-05-23 ENCOUNTER — Telehealth: Payer: Self-pay | Admitting: Cardiovascular Disease

## 2018-05-23 NOTE — Telephone Encounter (Signed)
Pt aware of coronary perfusion test results; see result note.

## 2018-05-23 NOTE — Telephone Encounter (Signed)
New Message   Pt returning call for nurse about stress test

## 2018-05-29 ENCOUNTER — Inpatient Hospital Stay (HOSPITAL_COMMUNITY)
Admission: RE | Admit: 2018-05-29 | Discharge: 2018-05-29 | Disposition: A | Discharge status: Home / Self Care | Payer: Medicare HMO | Source: Ambulatory Visit | Attending: Nurse Practitioner | Admitting: Nurse Practitioner

## 2018-05-29 DIAGNOSIS — M47816 Spondylosis without myelopathy or radiculopathy, lumbar region: Secondary | ICD-10-CM | POA: Diagnosis not present

## 2018-05-29 DIAGNOSIS — M545 Low back pain: Secondary | ICD-10-CM | POA: Diagnosis not present

## 2018-05-29 DIAGNOSIS — I4819 Other persistent atrial fibrillation: Secondary | ICD-10-CM

## 2018-05-30 ENCOUNTER — Other Ambulatory Visit: Payer: Self-pay

## 2018-05-30 ENCOUNTER — Other Ambulatory Visit (HOSPITAL_COMMUNITY): Payer: Self-pay | Admitting: Nurse Practitioner

## 2018-05-30 ENCOUNTER — Encounter (HOSPITAL_COMMUNITY): Payer: Self-pay | Admitting: Nurse Practitioner

## 2018-05-30 ENCOUNTER — Ambulatory Visit (HOSPITAL_COMMUNITY)
Admission: RE | Admit: 2018-05-30 | Discharge: 2018-05-30 | Disposition: A | Discharge status: Home / Self Care | Payer: Medicare HMO | Source: Ambulatory Visit | Attending: Nurse Practitioner | Admitting: Nurse Practitioner

## 2018-05-30 VITALS — BP 118/68 | HR 67 | Ht 62.0 in | Wt 269.0 lb

## 2018-05-30 DIAGNOSIS — I4819 Other persistent atrial fibrillation: Secondary | ICD-10-CM

## 2018-05-30 DIAGNOSIS — M1990 Unspecified osteoarthritis, unspecified site: Secondary | ICD-10-CM | POA: Diagnosis not present

## 2018-05-30 DIAGNOSIS — E785 Hyperlipidemia, unspecified: Secondary | ICD-10-CM | POA: Diagnosis not present

## 2018-05-30 DIAGNOSIS — E559 Vitamin D deficiency, unspecified: Secondary | ICD-10-CM | POA: Insufficient documentation

## 2018-05-30 DIAGNOSIS — K219 Gastro-esophageal reflux disease without esophagitis: Secondary | ICD-10-CM | POA: Diagnosis not present

## 2018-05-30 DIAGNOSIS — Z79899 Other long term (current) drug therapy: Secondary | ICD-10-CM | POA: Insufficient documentation

## 2018-05-30 DIAGNOSIS — I4891 Unspecified atrial fibrillation: Secondary | ICD-10-CM | POA: Diagnosis not present

## 2018-05-30 DIAGNOSIS — I447 Left bundle-branch block, unspecified: Secondary | ICD-10-CM | POA: Insufficient documentation

## 2018-05-30 DIAGNOSIS — I5022 Chronic systolic (congestive) heart failure: Secondary | ICD-10-CM | POA: Insufficient documentation

## 2018-05-30 DIAGNOSIS — I481 Persistent atrial fibrillation: Secondary | ICD-10-CM | POA: Insufficient documentation

## 2018-05-30 DIAGNOSIS — I2510 Atherosclerotic heart disease of native coronary artery without angina pectoris: Secondary | ICD-10-CM | POA: Insufficient documentation

## 2018-05-30 DIAGNOSIS — K589 Irritable bowel syndrome without diarrhea: Secondary | ICD-10-CM | POA: Diagnosis not present

## 2018-05-30 DIAGNOSIS — I110 Hypertensive heart disease with heart failure: Secondary | ICD-10-CM | POA: Insufficient documentation

## 2018-05-30 DIAGNOSIS — Z7901 Long term (current) use of anticoagulants: Secondary | ICD-10-CM | POA: Diagnosis not present

## 2018-05-30 DIAGNOSIS — Z87891 Personal history of nicotine dependence: Secondary | ICD-10-CM | POA: Insufficient documentation

## 2018-05-30 LAB — COMPREHENSIVE METABOLIC PANEL
ALT: 17 U/L (ref 0–44)
AST: 19 U/L (ref 15–41)
Albumin: 3.6 g/dL (ref 3.5–5.0)
Alkaline Phosphatase: 59 U/L (ref 38–126)
Anion gap: 7 (ref 5–15)
BUN: 25 mg/dL — ABNORMAL HIGH (ref 8–23)
CALCIUM: 9.1 mg/dL (ref 8.9–10.3)
CHLORIDE: 108 mmol/L (ref 98–111)
CO2: 25 mmol/L (ref 22–32)
CREATININE: 0.74 mg/dL (ref 0.44–1.00)
Glucose, Bld: 118 mg/dL — ABNORMAL HIGH (ref 70–99)
Potassium: 4.1 mmol/L (ref 3.5–5.1)
SODIUM: 140 mmol/L (ref 135–145)
Total Bilirubin: 1 mg/dL (ref 0.3–1.2)
Total Protein: 6.8 g/dL (ref 6.5–8.1)

## 2018-05-30 LAB — TSH: TSH: 3.13 u[IU]/mL (ref 0.350–4.500)

## 2018-05-30 MED ORDER — CARVEDILOL 12.5 MG PO TABS: 12.5000 mg | ORAL_TABLET | Freq: Two times a day (BID) | ORAL | 3 refills | Status: DC

## 2018-05-30 MED ORDER — AMIODARONE HCL 200 MG PO TABS: 200.0000 mg | ORAL_TABLET | Freq: Two times a day (BID) | ORAL | 0 refills | Status: DC

## 2018-05-30 NOTE — Patient Instructions (Signed)
STOP VERAPAMIL START AMIODARONE 200 mg twice a day Continue current carvedilol dose.

## 2018-05-30 NOTE — Progress Notes (Addendum)
Primary Care Physician: [name], MD Referring Physician: Dr. Levander Campion Mckissack is a 82 y.o. female  with h/oPAF, nonobstructive CAD, chronic systolic heart failure with normalization of EF, HTN, HLD, and DM II. She was hospitalized twice in August,2018, first for GI bleeding and then for lumbar spine compression fracture. Cardiology F/U EKG obtained on 07/12/2017 showed atrial fibrillation, Sheunderwent successful DC cardioversion on 07/16/2017 after 3 shocks, one at 150 J and twice at 200 J. She was seen by Cecilie Kicks, NP 05/2018 and was found back in afib and discussed with Dr. Johnsie Cancel. She was set up for echo and stress test which will completed later this week. She is in afib clinic to further antiarrythmic therapy. She is tired and short of breath with afib.  F/u in afib clinic, 8/23, she has considered her options to restore SR and she wants to start amiodarone. She did have her test test which was read as low risk.  Today, she denies symptoms of palpitations, chest pain,  orthopnea, PND, lower extremity edema, dizziness, presyncope, syncope, or neurologic sequela. + fatigue and shortness of breath with afib. The patient is tolerating medications without difficulties and is otherwise without complaint today.   Past Medical History:  Diagnosis Date  . Aortic insufficiency   . Atrial fibrillation ([place])    a. s/p DCCV 03/2013  . CAD (coronary artery disease)    LHC 06/2010: EF 55%, mild plaque in the LAD 20-30%, otherwise normal coronary arteries  . CATARACTS   . Chronic systolic heart failure ([place])    in setting of AFib;  Echocardiogram 02/2013: Moderate LVH, EF 25-30%, anteroseptal and apical HK, moderate AI, MAC, moderate MR, moderate LAE, mild RAE, PASP 32 => after DCCV f/u Echo 8/14: Moderate LVH, EF 60-65%, normal wall motion, grade 1 diastolic dysfunction, moderate AI, moderate LAE  . Complication of anesthesia 2009   nausea and vomitting  . GERD (gastroesophageal  reflux disease)   . HYPERLIPIDEMIA, MILD   . HYPERTENSION, BENIGN SYSTEMIC   . Irritable bowel syndrome   . LBBB (left bundle branch block)   . MEDIAL EPICONDYLITIS   . NSVT (nonsustained ventricular tachycardia) ([place])    during Dob Echo 2011 => normal cath  . OSTEOARTHRITIS   . OSTEOPENIA   . PONV (postoperative nausea and vomiting)   . Pre-diabetes   . RHINITIS, ALLERGIC   . SYNCOPE   . VITAMIN D DEFICIENCY    Past Surgical History:  Procedure Laterality Date  . CARDIOVERSION N/A 04/01/2013   Procedure: CARDIOVERSION;  Surgeon: Josue Hector, MD;  Location: [facility] ENDOSCOPY;  Service: Cardiovascular;  Laterality: N/A;  . CARDIOVERSION N/A 07/16/2017   Procedure: CARDIOVERSION;  Surgeon: Pixie Casino, MD;  Location: [facility];  Service: Cardiovascular;  Laterality: N/A;  . CATARACT EXTRACTION, BILATERAL  2012   Dr. Herbert Deaner  . COLONOSCOPY WITH PROPOFOL N/A 05/23/2017   Procedure: COLONOSCOPY WITH PROPOFOL;  Surgeon: Wonda Horner, MD;  Location[facility] ENDOSCOPY;  Service: Endoscopy;  Laterality: N/A;  . ESOPHAGOGASTRODUODENOSCOPY (EGD) WITH PROPOFOL N/A 05/23/2017   Procedure: ESOPHAGOGASTRODUODENOSCOPY (EGD) WITH PROPOFOL;  Surgeon: Wonda Horner, MD;  Location: [facility] ENDOSCOPY;  Service: Endoscopy;  Laterality: N/A;  . IR KYPHO LUMBAR INC FX REDUCE BONE BX UNI/BIL CANNULATION INC/IMAGING  06/06/2017  . IR RADIOLOGIST EVAL & MGMT  07/01/2017  . RADICAL HYSTERECTOMY    . REPLACEMENT TOTAL KNEE     11/03/07 right....... 03/29/08 left    Current Outpatient Medications  Medication Sig Dispense Refill  . Acetaminophen 500 MG coapsule Take 500 mg by mouth every 6 (six) hours as needed for pain.     . Calcium Carb-Cholecalciferol (CALCIUM 600/VITAMIN D3) 600-800 MG-UNIT TABS Take 1 tablet by mouth 2 (two) times daily.    . Cholecalciferol (VITAMIN D PO) Take 1 tablet by mouth daily.    . furosemide (LASIX) 20 MG tablet Take 1 tablet (20 mg total) by mouth daily. (Patient taking differently:  Take 40 mg by mouth daily. ) 1 tablet 0  . gabapentin (NEURONTIN) 300 MG capsule TAKE 1 CAPSULE FOUR TIMES DAILY 360 capsule 1  . Multiple Vitamin (MULTIVITAMIN WITH MINERALS) TABS tablet Take 1 tablet by mouth daily.    . Omega-3 Fatty Acids (FISH OIL PO) Take 1 capsule by mouth daily.     . polyvinyl alcohol-povidone (REFRESH) 1.4-0.6 % ophthalmic solution Place 1-2 drops into both eyes daily as needed (dry eyes).     . potassium chloride (K-DUR,KLOR-CON) 10 MEQ tablet Take 1 tablet (10 mEq total) by mouth 2 (two) times daily. 180 tablet 3  . rivaroxaban (XARELTO) 20 MG TABS tablet TAKE 20 mg TABLET EVERY DAY WITH SUPPER, start on 05/25/17 (Patient taking differently: Take 20 mg by mouth daily with supper. , start on 05/25/17) 90 tablet 3  . vitamin B-12 (CYANOCOBALAMIN) 1000 MCG tablet Take 1,000 mcg by mouth daily.      Marland Kitchen amiodarone (PACERONE) 200 MG tablet Take 1 tablet (200 mg total) by mouth 2 (two) times daily. 60 tablet 0  . carvedilol (COREG) 12.5 MG tablet Take 1 tablet (12.5 mg total) by mouth 2 (two) times daily. 180 tablet 3  . losartan (COZAAR) 25 MG tablet Take 1 tablet (25 mg total) by mouth daily. 90 tablet 3  . Psyllium (METAMUCIL PO) Take 5 mLs by mouth daily as needed (constipation). Mix in 8 oz liquid and drink     No current facility-administered medications for this encounter.     Allergies  Allergen Reactions  . Amlodipine     Other reaction(s): HEADACHE  . Metformin And Related Other (See Comments)    Renal failure  . Amlodipine Besylate-Valsartan Other (See Comments)    headache    Social History   Socioeconomic History  . Marital status: Divorced    Spouse name: Not on file  . Number of children: 1  . Years of education: Not on file  . Highest education level: Not on file  Occupational History  . Occupation: Retired    Fish farm manager: RETIRED  Social Needs  . Financial resource strain: Not on file  . Food insecurity:    Worry: Not on file    Inability: Not  on file  . Transportation needs:    Medical: Not on file    Non-medical: Not on file  Tobacco Use  . Smoking status: Former Smoker    Last attempt to quit: 10/08/1970    Years since quitting: 47.6  . Smokeless tobacco: Never Used  Substance and Sexual Activity  . Alcohol use: No  . Drug use: No  . Sexual activity: Not on file  Lifestyle  . Physical activity:    Days per week: Not on file    Minutes per session: Not on file  . Stress: Not on file  Relationships  . Social connections:    Talks on phone: Not on file    Gets together: Not on file    Attends religious service: Not on file  Active member of club or organization: Not on file    Attends meetings of clubs or organizations: Not on file    Relationship status: Not on file  . Intimate partner violence:    Fear of current or ex partner: Not on file    Emotionally abused: Not on file    Physically abused: Not on file    Forced sexual activity: Not on file  Other Topics Concern  . Not on file  Social History Narrative  . Not on file    Family History  Problem Relation Age of Onset  . Arrhythmia Mother        afib  . Alzheimer's disease Mother   . Colon cancer Father   . Heart attack Father 45  . Diabetes Sister   . Arrhythmia Sister        afib    ROS- All systems are reviewed and negative except as per the HPI above  Physical Exam: Vitals:   05/30/18 1035  BP: 118/68  Pulse: (!) 47  Weight: 122 kg  Height: _0  (1.575 m)   Wt Readings from Last 3 Encounters:  05/30/18 122 kg  05/21/18 121.8 kg  05/19/18 119.7 kg    Labs: Lab Results  Component Value Date   NA 140 05/30/2018   K 4.1 05/30/2018   CL 108 05/30/2018   CO2 25 05/30/2018   GLUCOSE 118 (H) 05/30/2018   BUN 25 (H) 05/30/2018   CREATININE 0.74 05/30/2018   CALCIUM 9.1 05/30/2018   PHOS 4.2 06/02/2017   MG 1.9 07/12/2017   Lab Results  Component Value Date   INR 1.01 06/06/2017   Lab Results  Component Value Date   CHOL  193 03/01/2017   HDL 54 03/01/2017   LDLCALC 124 (H) 03/01/2017   TRIG 76 03/01/2017     GEN- The patient is well appearing, alert and oriented x 3 today.   Head- normocephalic, atraumatic Eyes-  Sclera clear, conjunctiva pink Ears- hearing intact Oropharynx- clear Neck- supple, no JVP Lymph- no cervical lymphadenopathy Lungs- Clear to ausculation bilaterally, normal work of breathing Heart- irregular rate and rhythm, no murmurs, rubs or gallops, PMI not laterally displaced GI- soft, NT, ND, + BS Extremities- no clubbing, cyanosis, or edema MS- no significant deformity or atrophy Skin- no rash or lesion Psych- euthymic mood, full affect Neuro- strength and sensation are intact  EKG-afib at 69 bpm Echo-Study Conclusions  - Left ventricle: The cavity size was normal. There was mild focal   basal hypertrophy of the septum. Systolic function was mildly   reduced. The estimated ejection fraction was in the range of 45%   to 50%. Wall motion was normal; there were no regional wall   motion abnormalities. - Aortic valve: Transvalvular velocity was within the normal range.   There was no stenosis. There was mild to moderate regurgitation. - Aorta: Ascending aortic diameter: 37.43 mm (S). - Ascending aorta: The ascending aorta was mildly dilated. - Mitral valve: Transvalvular velocity was within the normal range.   There was no evidence for stenosis. There was mild regurgitation. - Left atrium: The atrium was mildly dilated. - Right ventricle: The cavity size was normal. Wall thickness was   normal. Systolic function was normal. - Atrial septum: No defect or patent foramen ovale was identified   by color flow Doppler. - Tricuspid valve: There was mild regurgitation. - Pulmonary arteries: Systolic pressure was within the normal   range. PA peak pressure: 33 mm Hg (  S).  Impressions:  - Compared with the echo 06/6923, systolic function is reduced.  Stress test-Notes recorded  by Jerline Pain, MD on 05/22/2018 at 4:38 PM EDT 1. EF 48%, septal hypokinesis.  2. Fixed small, mild mid to apical anteroseptal perfusion defect.   The septal hypokinesis and the fixed septal defect are due to LBBB. There is no ischemia. Low risk study  Assessment and Plan: 1. Persistent  afib Discussed with pt and daughter antiarrythmic therapy to restore SR Since she has reduced EF on echo and a LBBB, do not feel flecainide or multaq are appropriate options  Discussed amiodarone, tikosyn and sotalol in great detail Pt would like to start amiodarone, will start load at 200 mg bid Since she has a HR in afib  in the low 60's , will stop verapamil with addition of amiodarone Baseline TSH/cmet today Review of previous cxr and ct of abdomen that showed lower lungs without lung abnomality  F/u here in one week for EKG on amiodarone   Jonelle Bann C. Jago Carton, [facility] [address] [place], [place] [address] [phone]

## 2018-06-05 ENCOUNTER — Encounter (HOSPITAL_COMMUNITY): Payer: Self-pay | Admitting: Nurse Practitioner

## 2018-06-05 DIAGNOSIS — M545 Low back pain: Secondary | ICD-10-CM | POA: Diagnosis not present

## 2018-06-05 DIAGNOSIS — M47816 Spondylosis without myelopathy or radiculopathy, lumbar region: Secondary | ICD-10-CM | POA: Diagnosis not present

## 2018-06-05 DIAGNOSIS — R6889 Other general symptoms and signs: Secondary | ICD-10-CM | POA: Diagnosis not present

## 2018-06-05 NOTE — Progress Notes (Signed)
error 

## 2018-06-05 NOTE — Addendum Note (Signed)
Encounter addended by: Awilda Bill, CMA on: 06/05/2018 8:43 AM  Actions taken: Vitals modified

## 2018-06-05 NOTE — Progress Notes (Signed)
Erroneous encounter

## 2018-06-06 ENCOUNTER — Encounter (HOSPITAL_COMMUNITY): Payer: Medicare HMO | Admitting: Nurse Practitioner

## 2018-06-10 ENCOUNTER — Ambulatory Visit (HOSPITAL_COMMUNITY)
Admission: RE | Admit: 2018-06-10 | Discharge: 2018-06-10 | Disposition: A | Discharge status: Home / Self Care | Payer: Medicare HMO | Source: Ambulatory Visit | Attending: Nurse Practitioner | Admitting: Nurse Practitioner

## 2018-06-10 DIAGNOSIS — I4891 Unspecified atrial fibrillation: Secondary | ICD-10-CM | POA: Diagnosis not present

## 2018-06-10 DIAGNOSIS — R0602 Shortness of breath: Secondary | ICD-10-CM | POA: Diagnosis not present

## 2018-06-10 DIAGNOSIS — I4892 Unspecified atrial flutter: Secondary | ICD-10-CM | POA: Insufficient documentation

## 2018-06-10 DIAGNOSIS — I447 Left bundle-branch block, unspecified: Secondary | ICD-10-CM | POA: Insufficient documentation

## 2018-06-10 NOTE — Progress Notes (Addendum)
Pt in for repeat EKG after starting amiodarone.  To be reviewed by Rudi Coco, NP.  Pt has been on amiodarone 200 mg bid x one week. She is tolerating well. Her Ekg shows atrail flutter at 90 bpm. qtc at 494 ms. She is on xarelto without missed doses. I will see back in 2 weeks for consideration to schedule cardioversion.

## 2018-06-11 ENCOUNTER — Other Ambulatory Visit: Payer: Self-pay | Admitting: Cardiovascular Disease

## 2018-06-11 ENCOUNTER — Other Ambulatory Visit (HOSPITAL_COMMUNITY): Payer: Self-pay | Admitting: *Deleted

## 2018-06-11 ENCOUNTER — Telehealth (HOSPITAL_COMMUNITY): Payer: Self-pay | Admitting: *Deleted

## 2018-06-11 MED ORDER — RIVAROXABAN 20 MG PO TABS: 20.0000 mg | ORAL_TABLET | Freq: Every day | ORAL | 1 refills | Status: DC

## 2018-06-11 NOTE — Telephone Encounter (Signed)
Patient called in stating she ran out of her Xarelto and did not have a dose to take last night. She is getting a supply from local pharmacy today until her mail order arrives.

## 2018-06-18 DIAGNOSIS — M545 Low back pain: Secondary | ICD-10-CM | POA: Diagnosis not present

## 2018-06-18 DIAGNOSIS — M47816 Spondylosis without myelopathy or radiculopathy, lumbar region: Secondary | ICD-10-CM | POA: Diagnosis not present

## 2018-06-18 DIAGNOSIS — R6889 Other general symptoms and signs: Secondary | ICD-10-CM | POA: Diagnosis not present

## 2018-06-25 ENCOUNTER — Ambulatory Visit (HOSPITAL_COMMUNITY)
Admission: RE | Admit: 2018-06-25 | Discharge: 2018-06-25 | Disposition: A | Discharge status: Home / Self Care | Payer: Medicare HMO | Source: Ambulatory Visit | Attending: Nurse Practitioner | Admitting: Nurse Practitioner

## 2018-06-25 VITALS — BP 132/84 | HR 75 | Ht 62.0 in | Wt 276.0 lb

## 2018-06-25 DIAGNOSIS — I2510 Atherosclerotic heart disease of native coronary artery without angina pectoris: Secondary | ICD-10-CM | POA: Insufficient documentation

## 2018-06-25 DIAGNOSIS — K219 Gastro-esophageal reflux disease without esophagitis: Secondary | ICD-10-CM | POA: Diagnosis not present

## 2018-06-25 DIAGNOSIS — E785 Hyperlipidemia, unspecified: Secondary | ICD-10-CM | POA: Diagnosis not present

## 2018-06-25 DIAGNOSIS — I4439 Other atrioventricular block: Secondary | ICD-10-CM | POA: Insufficient documentation

## 2018-06-25 DIAGNOSIS — E559 Vitamin D deficiency, unspecified: Secondary | ICD-10-CM | POA: Insufficient documentation

## 2018-06-25 DIAGNOSIS — Z87891 Personal history of nicotine dependence: Secondary | ICD-10-CM | POA: Insufficient documentation

## 2018-06-25 DIAGNOSIS — Z8249 Family history of ischemic heart disease and other diseases of the circulatory system: Secondary | ICD-10-CM | POA: Insufficient documentation

## 2018-06-25 DIAGNOSIS — M8580 Other specified disorders of bone density and structure, unspecified site: Secondary | ICD-10-CM | POA: Insufficient documentation

## 2018-06-25 DIAGNOSIS — Z7901 Long term (current) use of anticoagulants: Secondary | ICD-10-CM | POA: Diagnosis not present

## 2018-06-25 DIAGNOSIS — I110 Hypertensive heart disease with heart failure: Secondary | ICD-10-CM | POA: Diagnosis not present

## 2018-06-25 DIAGNOSIS — I447 Left bundle-branch block, unspecified: Secondary | ICD-10-CM | POA: Insufficient documentation

## 2018-06-25 DIAGNOSIS — I4819 Other persistent atrial fibrillation: Secondary | ICD-10-CM

## 2018-06-25 DIAGNOSIS — Z833 Family history of diabetes mellitus: Secondary | ICD-10-CM | POA: Insufficient documentation

## 2018-06-25 DIAGNOSIS — M47816 Spondylosis without myelopathy or radiculopathy, lumbar region: Secondary | ICD-10-CM | POA: Diagnosis not present

## 2018-06-25 DIAGNOSIS — Z79899 Other long term (current) drug therapy: Secondary | ICD-10-CM | POA: Insufficient documentation

## 2018-06-25 DIAGNOSIS — Z96653 Presence of artificial knee joint, bilateral: Secondary | ICD-10-CM | POA: Insufficient documentation

## 2018-06-25 DIAGNOSIS — I481 Persistent atrial fibrillation: Secondary | ICD-10-CM

## 2018-06-25 DIAGNOSIS — Z888 Allergy status to other drugs, medicaments and biological substances status: Secondary | ICD-10-CM | POA: Diagnosis not present

## 2018-06-25 DIAGNOSIS — R6889 Other general symptoms and signs: Secondary | ICD-10-CM | POA: Diagnosis not present

## 2018-06-25 DIAGNOSIS — M545 Low back pain: Secondary | ICD-10-CM | POA: Diagnosis not present

## 2018-06-25 DIAGNOSIS — I5022 Chronic systolic (congestive) heart failure: Secondary | ICD-10-CM | POA: Insufficient documentation

## 2018-06-25 DIAGNOSIS — E119 Type 2 diabetes mellitus without complications: Secondary | ICD-10-CM | POA: Insufficient documentation

## 2018-06-25 DIAGNOSIS — K589 Irritable bowel syndrome without diarrhea: Secondary | ICD-10-CM | POA: Diagnosis not present

## 2018-06-25 DIAGNOSIS — M1990 Unspecified osteoarthritis, unspecified site: Secondary | ICD-10-CM | POA: Insufficient documentation

## 2018-06-25 DIAGNOSIS — I4891 Unspecified atrial fibrillation: Secondary | ICD-10-CM | POA: Diagnosis present

## 2018-06-25 LAB — CBC WITH DIFFERENTIAL/PLATELET
Abs Immature Granulocytes: 0 10*3/uL (ref 0.0–0.1)
BASOS PCT: 0 %
Basophils Absolute: 0 10*3/uL (ref 0.0–0.1)
EOS ABS: 0.1 10*3/uL (ref 0.0–0.7)
EOS PCT: 2 %
HEMATOCRIT: 40.4 % (ref 36.0–46.0)
Hemoglobin: 12.8 g/dL (ref 12.0–15.0)
IMMATURE GRANULOCYTES: 0 %
LYMPHS ABS: 1.6 10*3/uL (ref 0.7–4.0)
Lymphocytes Relative: 28 %
MCH: 30 pg (ref 26.0–34.0)
MCHC: 31.7 g/dL (ref 30.0–36.0)
MCV: 94.8 fL (ref 78.0–100.0)
MONO ABS: 0.4 10*3/uL (ref 0.1–1.0)
MONOS PCT: 8 %
Neutro Abs: 3.6 10*3/uL (ref 1.7–7.7)
Neutrophils Relative %: 62 %
PLATELETS: 193 10*3/uL (ref 150–400)
RBC: 4.26 MIL/uL (ref 3.87–5.11)
RDW: 14.1 % (ref 11.5–15.5)
WBC: 5.7 10*3/uL (ref 4.0–10.5)

## 2018-06-25 LAB — BASIC METABOLIC PANEL
Anion gap: 10 (ref 5–15)
BUN: 24 mg/dL — AB (ref 8–23)
CO2: 24 mmol/L (ref 22–32)
Calcium: 9.1 mg/dL (ref 8.9–10.3)
Chloride: 109 mmol/L (ref 98–111)
Creatinine, Ser: 0.98 mg/dL (ref 0.44–1.00)
GFR calc Af Amer: >60 mL/min (ref >60)
GFR, EST NON AFRICAN AMERICAN: 53 mL/min — AB (ref >60)
Glucose, Bld: 148 mg/dL — ABNORMAL HIGH (ref 70–99)
POTASSIUM: 4.1 mmol/L (ref 3.5–5.1)
SODIUM: 143 mmol/L (ref 135–145)

## 2018-06-25 MED ORDER — AMIODARONE HCL 200 MG PO TABS: 200.0000 mg | ORAL_TABLET | Freq: Two times a day (BID) | ORAL | 0 refills | Status: DC

## 2018-06-25 MED ORDER — AMIODARONE HCL 200 MG PO TABS: 200.0000 mg | ORAL_TABLET | Freq: Every day | ORAL | 2 refills | Status: DC

## 2018-06-25 NOTE — Progress Notes (Signed)
Primary Care Physician: [name], MD Referring Physician: Dr. Levander Campion Starkey is a 82 y.o. female  with h/oPAF, nonobstructive CAD, chronic systolic heart failure with normalization of EF, HTN, HLD, and DM II. She was hospitalized twice in August,2018, first for GI bleeding and then for lumbar spine compression fracture. Cardiology F/U EKG obtained on 07/12/2017 showed atrial fibrillation, Sheunderwent successful DC cardioversion on 07/16/2017 after 3 shocks, one at 150 J and twice at 200 J. She was seen by Cecilie Kicks, NP 05/2018 and was found back in afib and discussed with Dr. Johnsie Cancel. She was set up for echo and stress test which will completed later this week. She is in afib clinic to further antiarrythmic therapy. She is tired and short of breath with afib.  Pt decided to start amiodarone load with a cardioversion in around 4 weeks. She missed one dose of xarelto  9/4 but none since and cardioversion can be scheduled after 9/26. She has tolerated the  amiodarone well.  Today, she denies symptoms of palpitations, chest pain, shortness of breath, orthopnea, PND, lower extremity edema, dizziness, presyncope, syncope, or neurologic sequela. The patient is tolerating medications without difficulties and is otherwise without complaint today.   Past Medical History:  Diagnosis Date  . Aortic insufficiency   . Atrial fibrillation ([place])    a. s/p DCCV 03/2013  . CAD (coronary artery disease)    LHC 06/2010: EF 55%, mild plaque in the LAD 20-30%, otherwise normal coronary arteries  . CATARACTS   . Chronic systolic heart failure ([place])    in setting of AFib;  Echocardiogram 02/2013: Moderate LVH, EF 25-30%, anteroseptal and apical HK, moderate AI, MAC, moderate MR, moderate LAE, mild RAE, PASP 32 => after DCCV f/u Echo 8/14: Moderate LVH, EF 60-65%, normal wall motion, grade 1 diastolic dysfunction, moderate AI, moderate LAE  . Complication of anesthesia 2009   nausea and vomitting   . GERD (gastroesophageal reflux disease)   . HYPERLIPIDEMIA, MILD   . HYPERTENSION, BENIGN SYSTEMIC   . Irritable bowel syndrome   . LBBB (left bundle branch block)   . MEDIAL EPICONDYLITIS   . NSVT (nonsustained ventricular tachycardia) ([place])    during Dob Echo 2011 => normal cath  . OSTEOARTHRITIS   . OSTEOPENIA   . PONV (postoperative nausea and vomiting)   . Pre-diabetes   . RHINITIS, ALLERGIC   . SYNCOPE   . VITAMIN D DEFICIENCY    Past Surgical History:  Procedure Laterality Date  . CARDIOVERSION N/A 04/01/2013   Procedure: CARDIOVERSION;  Surgeon: Josue Hector, MD;  Location: [facility] ENDOSCOPY;  Service: Cardiovascular;  Laterality: N/A;  . CARDIOVERSION N/A 07/16/2017   Procedure: CARDIOVERSION;  Surgeon: Pixie Casino, MD;  Location: [facility];  Service: Cardiovascular;  Laterality: N/A;  . CATARACT EXTRACTION, BILATERAL  2012   Dr. Herbert Deaner  . COLONOSCOPY WITH PROPOFOL N/A 05/23/2017   Procedure: COLONOSCOPY WITH PROPOFOL;  Surgeon: Wonda Horner, MD;  Location: [facility] ENDOSCOPY;  Service: Endoscopy;  Laterality: N/A;  . ESOPHAGOGASTRODUODENOSCOPY (EGD) WITH PROPOFOL N/A 05/23/2017   Procedure: ESOPHAGOGASTRODUODENOSCOPY (EGD) WITH PROPOFOL;  Surgeon: Wonda Horner, MD;  Location: [facility] ENDOSCOPY;  Service: Endoscopy;  Laterality: N/A;  . IR KYPHO LUMBAR INC FX REDUCE BONE BX UNI/BIL CANNULATION INC/IMAGING  06/06/2017  . IR RADIOLOGIST EVAL & MGMT  07/01/2017  . RADICAL HYSTERECTOMY    . REPLACEMENT TOTAL KNEE     11/03/07 right....... 03/29/08 left    Current Outpatient  Medications  Medication Sig Dispense Refill  . Acetaminophen 500 MG coapsule Take 500 mg by mouth every 6 (six) hours as needed for pain.     Marland Kitchen amiodarone (PACERONE) 200 MG tablet Take 1 tablet (200 mg total) by mouth 2 (two) times daily. 60 tablet 0  . Calcium Carb-Cholecalciferol (CALCIUM 600/VITAMIN D3) 600-800 MG-UNIT TABS Take 1 tablet by mouth 2 (two) times daily.    . carvedilol (COREG) 12.5 MG tablet  Take 1 tablet (12.5 mg total) by mouth 2 (two) times daily. 180 tablet 3  . Cholecalciferol (VITAMIN D PO) Take 1 tablet by mouth daily.    . furosemide (LASIX) 20 MG tablet Take 1 tablet (20 mg total) by mouth daily. 1 tablet 0  . gabapentin (NEURONTIN) 300 MG capsule TAKE 1 CAPSULE FOUR TIMES DAILY 360 capsule 1  . losartan (COZAAR) 25 MG tablet Take 1 tablet (25 mg total) by mouth daily. 90 tablet 3  . Multiple Vitamin (MULTIVITAMIN WITH MINERALS) TABS tablet Take 1 tablet by mouth daily.    . Omega-3 Fatty Acids (FISH OIL PO) Take 1 capsule by mouth daily.     . polyvinyl alcohol-povidone (REFRESH) 1.4-0.6 % ophthalmic solution Place 1-2 drops into both eyes daily as needed (dry eyes).     . potassium chloride (K-DUR,KLOR-CON) 10 MEQ tablet Take 1 tablet (10 mEq total) by mouth 2 (two) times daily. 180 tablet 3  . Psyllium (METAMUCIL PO) Take 5 mLs by mouth daily as needed (constipation). Mix in 8 oz liquid and drink    . rivaroxaban (XARELTO) 20 MG TABS tablet Take 1 tablet (20 mg total) by mouth daily with supper. 30 tablet 1  . vitamin B-12 (CYANOCOBALAMIN) 1000 MCG tablet Take 1,000 mcg by mouth daily.      Marland Kitchen amiodarone (PACERONE) 200 MG tablet Take 1 tablet (200 mg total) by mouth daily. 90 tablet 2   No current facility-administered medications for this encounter.     Allergies  Allergen Reactions  . Amlodipine     Other reaction(s): HEADACHE  . Metformin And Related Other (See Comments)    Renal failure  . Amlodipine Besylate-Valsartan Other (See Comments)    headache    Social History   Socioeconomic History  . Marital status: Divorced    Spouse name: Not on file  . Number of children: 1  . Years of education: Not on file  . Highest education level: Not on file  Occupational History  . Occupation: Retired    Fish farm manager: RETIRED  Social Needs  . Financial resource strain: Not on file  . Food insecurity:    Worry: Not on file    Inability: Not on file  .  Transportation needs:    Medical: Not on file    Non-medical: Not on file  Tobacco Use  . Smoking status: Former Smoker    Last attempt to quit: 10/08/1970    Years since quitting: 47.7  . Smokeless tobacco: Never Used  Substance and Sexual Activity  . Alcohol use: No  . Drug use: No  . Sexual activity: Not on file  Lifestyle  . Physical activity:    Days per week: Not on file    Minutes per session: Not on file  . Stress: Not on file  Relationships  . Social connections:    Talks on phone: Not on file    Gets together: Not on file    Attends religious service: Not on file    Active member of club  or organization: Not on file    Attends meetings of clubs or organizations: Not on file    Relationship status: Not on file  . Intimate partner violence:    Fear of current or ex partner: Not on file    Emotionally abused: Not on file    Physically abused: Not on file    Forced sexual activity: Not on file  Other Topics Concern  . Not on file  Social History Narrative  . Not on file    Family History  Problem Relation Age of Onset  . Arrhythmia Mother        afib  . Alzheimer's disease Mother   . Colon cancer Father   . Heart attack Father 84  . Diabetes Sister   . Arrhythmia Sister        afib    ROS- All systems are reviewed and negative except as per the HPI above  Physical Exam: Vitals:   06/25/18 1432  BP: 132/84  Pulse: 75  SpO2: 93%  Weight: 125.2 kg  Height: 5' 2"  (1.575 m)   Wt Readings from Last 3 Encounters:  06/25/18 125.2 kg  05/30/18 122 kg  05/21/18 121.8 kg    Labs: Lab Results  Component Value Date   NA 140 05/30/2018   K 4.1 05/30/2018   CL 108 05/30/2018   CO2 25 05/30/2018   GLUCOSE 118 (H) 05/30/2018   BUN 25 (H) 05/30/2018   CREATININE 0.74 05/30/2018   CALCIUM 9.1 05/30/2018   PHOS 4.2 06/02/2017   MG 1.9 07/12/2017   Lab Results  Component Value Date   INR 1.01 06/06/2017   Lab Results  Component Value Date   CHOL 193  03/01/2017   HDL 54 03/01/2017   LDLCALC 124 (H) 03/01/2017   TRIG 76 03/01/2017     GEN- The patient is well appearing, alert and oriented x 3 today.   Head- normocephalic, atraumatic Eyes-  Sclera clear, conjunctiva pink Ears- hearing intact Oropharynx- clear Neck- supple, no JVP Lymph- no cervical lymphadenopathy Lungs- Clear to ausculation bilaterally, normal work of breathing Heart- irregular rate and rhythm, no murmurs, rubs or gallops, PMI not laterally displaced GI- soft, NT, ND, + BS Extremities- no clubbing, cyanosis, or edema MS- no significant deformity or atrophy Skin- no rash or lesion Psych- euthymic mood, full affect Neuro- strength and sensation are intact  EKG-afib at 69 bpm Echo-Study Conclusions  - Left ventricle: The cavity size was normal. There was mild focal   basal hypertrophy of the septum. Systolic function was mildly   reduced. The estimated ejection fraction was in the range of 45%   to 50%. Wall motion was normal; there were no regional wall   motion abnormalities. - Aortic valve: Transvalvular velocity was within the normal range.   There was no stenosis. There was mild to moderate regurgitation. - Aorta: Ascending aortic diameter: 37.43 mm (S). - Ascending aorta: The ascending aorta was mildly dilated. - Mitral valve: Transvalvular velocity was within the normal range.   There was no evidence for stenosis. There was mild regurgitation. - Left atrium: The atrium was mildly dilated. - Right ventricle: The cavity size was normal. Wall thickness was   normal. Systolic function was normal. - Atrial septum: No defect or patent foramen ovale was identified   by color flow Doppler. - Tricuspid valve: There was mild regurgitation. - Pulmonary arteries: Systolic pressure was within the normal   range. PA peak pressure: 33 mm Hg (S).  Impressions:  - Compared with the echo 0/2585, systolic function is reduced.   Assessment and Plan: 1.  Persistent  afib Pt has been loading on amiodarone 200 mg bid almost one month now She missed one dose of xarelto 20 mg daily but after 9/26 good for cardioversion for the 3 weeks uninterrupted use  Scheduled for cardioversion 9/30 She will reduce amiodarone to 200 mg daily at time of cardioversion Risk vrs benefit of procedure reviewed and opt wishes to proceed  F/u here in one week after cardioversion   Butch Penny C. Danyetta Gillham, [facility] [address] [place], [place] [address] [phone]

## 2018-06-25 NOTE — Patient Instructions (Signed)
Your cardioversion is scheduled for : Monday September 30th at 9:45 a.m. Arrive at the [facility] and go to admitting at 8:00 a.m. Do Not eat or drink anything after midnight the night prior to your procedure. Take all your medications with a sip of water prior to arrival. Do NOT miss any doses of your blood thinner. You will NOT be able to drive home after your procedure.    The day of your cardioversion, DECREASE your amiodarone to 200 mg; one tablet daily.

## 2018-06-25 NOTE — Progress Notes (Signed)
Erroneous encounter

## 2018-07-01 ENCOUNTER — Ambulatory Visit: Payer: Medicare HMO | Admitting: Family Medicine

## 2018-07-07 ENCOUNTER — Other Ambulatory Visit: Payer: Self-pay

## 2018-07-07 ENCOUNTER — Ambulatory Visit (HOSPITAL_COMMUNITY): Payer: Medicare HMO | Admitting: Anesthesiology

## 2018-07-07 ENCOUNTER — Ambulatory Visit (HOSPITAL_COMMUNITY)
Admission: RE | Admit: 2018-07-07 | Discharge: 2018-07-07 | Disposition: A | Discharge status: Home / Self Care | Payer: Medicare HMO | Source: Ambulatory Visit | Attending: Internal Medicine | Admitting: Internal Medicine

## 2018-07-07 ENCOUNTER — Encounter (HOSPITAL_COMMUNITY): Payer: Self-pay | Admitting: Anesthesiology

## 2018-07-07 ENCOUNTER — Encounter (HOSPITAL_COMMUNITY)
Admission: RE | Disposition: A | Discharge status: Home / Self Care | Payer: Self-pay | Source: Ambulatory Visit | Attending: Internal Medicine

## 2018-07-07 DIAGNOSIS — Z9842 Cataract extraction status, left eye: Secondary | ICD-10-CM | POA: Insufficient documentation

## 2018-07-07 DIAGNOSIS — H269 Unspecified cataract: Secondary | ICD-10-CM | POA: Insufficient documentation

## 2018-07-07 DIAGNOSIS — K219 Gastro-esophageal reflux disease without esophagitis: Secondary | ICD-10-CM | POA: Diagnosis not present

## 2018-07-07 DIAGNOSIS — M7700 Medial epicondylitis, unspecified elbow: Secondary | ICD-10-CM | POA: Diagnosis not present

## 2018-07-07 DIAGNOSIS — M8580 Other specified disorders of bone density and structure, unspecified site: Secondary | ICD-10-CM | POA: Diagnosis not present

## 2018-07-07 DIAGNOSIS — Z9841 Cataract extraction status, right eye: Secondary | ICD-10-CM | POA: Diagnosis not present

## 2018-07-07 DIAGNOSIS — Z9889 Other specified postprocedural states: Secondary | ICD-10-CM | POA: Insufficient documentation

## 2018-07-07 DIAGNOSIS — I481 Persistent atrial fibrillation: Secondary | ICD-10-CM

## 2018-07-07 DIAGNOSIS — I447 Left bundle-branch block, unspecified: Secondary | ICD-10-CM | POA: Insufficient documentation

## 2018-07-07 DIAGNOSIS — E1169 Type 2 diabetes mellitus with other specified complication: Secondary | ICD-10-CM | POA: Diagnosis not present

## 2018-07-07 DIAGNOSIS — I428 Other cardiomyopathies: Secondary | ICD-10-CM | POA: Diagnosis not present

## 2018-07-07 DIAGNOSIS — Z96659 Presence of unspecified artificial knee joint: Secondary | ICD-10-CM | POA: Diagnosis not present

## 2018-07-07 DIAGNOSIS — Z6841 Body Mass Index (BMI) 40.0 and over, adult: Secondary | ICD-10-CM | POA: Insufficient documentation

## 2018-07-07 DIAGNOSIS — I10 Essential (primary) hypertension: Secondary | ICD-10-CM | POA: Diagnosis not present

## 2018-07-07 DIAGNOSIS — I4891 Unspecified atrial fibrillation: Secondary | ICD-10-CM | POA: Diagnosis not present

## 2018-07-07 DIAGNOSIS — I5022 Chronic systolic (congestive) heart failure: Secondary | ICD-10-CM | POA: Insufficient documentation

## 2018-07-07 DIAGNOSIS — Z79899 Other long term (current) drug therapy: Secondary | ICD-10-CM | POA: Diagnosis not present

## 2018-07-07 DIAGNOSIS — D649 Anemia, unspecified: Secondary | ICD-10-CM | POA: Insufficient documentation

## 2018-07-07 DIAGNOSIS — Z87891 Personal history of nicotine dependence: Secondary | ICD-10-CM | POA: Insufficient documentation

## 2018-07-07 DIAGNOSIS — G2581 Restless legs syndrome: Secondary | ICD-10-CM | POA: Insufficient documentation

## 2018-07-07 DIAGNOSIS — E785 Hyperlipidemia, unspecified: Secondary | ICD-10-CM | POA: Insufficient documentation

## 2018-07-07 DIAGNOSIS — Z7901 Long term (current) use of anticoagulants: Secondary | ICD-10-CM | POA: Insufficient documentation

## 2018-07-07 DIAGNOSIS — N179 Acute kidney failure, unspecified: Secondary | ICD-10-CM | POA: Diagnosis not present

## 2018-07-07 DIAGNOSIS — E1129 Type 2 diabetes mellitus with other diabetic kidney complication: Secondary | ICD-10-CM | POA: Diagnosis not present

## 2018-07-07 DIAGNOSIS — I110 Hypertensive heart disease with heart failure: Secondary | ICD-10-CM | POA: Insufficient documentation

## 2018-07-07 DIAGNOSIS — Z90710 Acquired absence of both cervix and uterus: Secondary | ICD-10-CM | POA: Diagnosis not present

## 2018-07-07 DIAGNOSIS — K589 Irritable bowel syndrome without diarrhea: Secondary | ICD-10-CM | POA: Diagnosis not present

## 2018-07-07 HISTORY — PX: CARDIOVERSION: SHX1299

## 2018-07-07 LAB — POCT I-STAT 4, (NA,K, GLUC, HGB,HCT)
Glucose, Bld: 115 mg/dL — ABNORMAL HIGH (ref 70–99)
HCT: 38 % (ref 36.0–46.0)
HEMOGLOBIN: 12.9 g/dL (ref 12.0–15.0)
POTASSIUM: 4.8 mmol/L (ref 3.5–5.1)
SODIUM: 140 mmol/L (ref 135–145)

## 2018-07-07 SURGERY — CARDIOVERSION
Anesthesia: General

## 2018-07-07 MED ORDER — SODIUM CHLORIDE 0.9 % IV SOLN
INTRAVENOUS | Status: DC
  Administered 2018-07-07: 09:00:00 via INTRAVENOUS

## 2018-07-07 MED ORDER — LIDOCAINE 2% (20 MG/ML) 5 ML SYRINGE
INTRAMUSCULAR | Status: DC | PRN
  Administered 2018-07-07: 100 mg via INTRAVENOUS

## 2018-07-07 MED ORDER — PROPOFOL 10 MG/ML IV BOLUS
INTRAVENOUS | Status: DC | PRN
  Administered 2018-07-07: 60 mg via INTRAVENOUS

## 2018-07-07 MED ORDER — AMIODARONE HCL 200 MG PO TABS: 200.0000 mg | ORAL_TABLET | Freq: Every day | ORAL | 2 refills | Status: DC

## 2018-07-07 NOTE — Anesthesia Postprocedure Evaluation (Signed)
Anesthesia Post Note  Patient: Fidencia B Tillison  Procedure(s) Performed: CARDIOVERSION (N/A )     Patient location during evaluation: Endoscopy Anesthesia Type: General Level of consciousness: awake and alert Pain management: pain level controlled Vital Signs Assessment: post-procedure vital signs reviewed and stable Respiratory status: spontaneous breathing, nonlabored ventilation, respiratory function stable and patient connected to nasal cannula oxygen Cardiovascular status: blood pressure returned to baseline and stable Postop Assessment: no apparent nausea or vomiting Anesthetic complications: no    Last Vitals:  Vitals:   07/07/18 0945 07/07/18 0955  BP: (!) 151/96 (!) 180/86  Pulse: 65 67  Resp: (!) 22 19  Temp:    SpO2: 96% 98%    Last Pain:  Vitals:   07/07/18 0955  TempSrc:   PainSc: 0-No pain                 Trevor Iha

## 2018-07-07 NOTE — Transfer of Care (Signed)
Immediate Anesthesia Transfer of Care Note  Patient: Charlotte [name]  Procedure(s) Performed: CARDIOVERSION (N/A )  Patient Location: Endoscopy Unit  Anesthesia Type:MAC  Level of Consciousness: awake, alert  and oriented  Airway & Oxygen Therapy: Patient Spontanous Breathing  Post-op Assessment: Report given to RN and Post -op Vital signs reviewed and stable  Post vital signs: Reviewed and stable  Last Vitals:  Vitals Value Taken Time  BP    Temp    Pulse    Resp    SpO2      Last Pain:  Vitals:   07/07/18 0806  TempSrc: Oral  PainSc: 6          Complications: No apparent anesthesia complications

## 2018-07-07 NOTE — Anesthesia Preprocedure Evaluation (Addendum)
Anesthesia Evaluation  Patient identified by MRN, date of birth, ID band Patient awake    Reviewed: Allergy & Precautions, H&P , NPO status , Patient's Chart, lab work & pertinent test results, reviewed documented beta blocker date and time   History of Anesthesia Complications (+) PONV  Airway Mallampati: II  TM Distance: >3 FB Neck ROM: full    Dental no notable dental hx.    Pulmonary neg pulmonary ROS, former smoker,    Pulmonary exam normal breath sounds clear to auscultation       Cardiovascular hypertension, Pt. on medications and Pt. on home beta blockers negative cardio ROS  + dysrhythmias Atrial Fibrillation  Rhythm:regular Rate:Normal     Neuro/Psych negative neurological ROS  negative psych ROS   GI/Hepatic negative GI ROS, Neg liver ROS,   Endo/Other  negative endocrine ROS  Renal/GU negative Renal ROS  negative genitourinary   Musculoskeletal negative musculoskeletal ROS (+)   Abdominal   Peds negative pediatric ROS (+)  Hematology negative hematology ROS (+)   Anesthesia Other Findings The estimated ejection fraction was in the range of 55% to 60%. Wall motion was normal; there were no regional wall motion abnormalities  Reproductive/Obstetrics negative OB ROS                             Anesthesia Physical  Anesthesia Plan  ASA: IV  Anesthesia Plan: General   Post-op Pain Management:    Induction: Intravenous  PONV Risk Score and Plan: 4 or greater and Treatment may vary due to age or medical condition  Airway Management Planned: Mask  Additional Equipment:   Intra-op Plan:   Post-operative Plan:   Informed Consent: I have reviewed the patients History and Physical, chart, labs and discussed the procedure including the risks, benefits and alternatives for the proposed anesthesia with the patient or authorized representative who has indicated his/her  understanding and acceptance.   Dental Advisory Given  Plan Discussed with: CRNA and Surgeon  Anesthesia Plan Comments: (  )       Anesthesia Quick Evaluation

## 2018-07-07 NOTE — H&P (Signed)
   INTERVAL PROCEDURE H&P  History and Physical Interval Note:  07/07/2018 8:17 AM  Charlotte [name] has presented today for their planned procedure. The various methods of treatment have been discussed with the patient and family. After consideration of risks, benefits and other options for treatment, the patient has consented to the procedure.  The patients' outpatient history has been reviewed, patient examined, and no change in status from most recent office note within the past 30 days. I have reviewed the patients' chart and labs and will proceed as planned. Questions were answered to the patient's satisfaction.   Chrystie Nose, MD, [facility], FACP  [place]  [facility]  Medical Director of the Advanced Lipid Disorders &  Cardiovascular Risk Reduction Clinic Diplomate of the American Board of Clinical Lipidology Attending Cardiologist  Direct Dial: [phone]  Fax: [phone]  Website:  [email or web address] 07/07/2018, 8:17 AM

## 2018-07-07 NOTE — Anesthesia Procedure Notes (Signed)
Procedure Name: MAC Date/Time: 07/07/2018 9:18 AM Performed by: Marsa Aris, CRNA Pre-anesthesia Checklist: Patient identified, Emergency Drugs available, Suction available, Patient being monitored and Timeout performed Patient Re-evaluated:Patient Re-evaluated prior to induction Oxygen Delivery Method: Circle system utilized Preoxygenation: Pre-oxygenation with 100% oxygen Induction Type: IV induction Ventilation: Mask ventilation without difficulty

## 2018-07-07 NOTE — CV Procedure (Signed)
   CARDIOVERSION NOTE  Procedure: Electrical Cardioversion Indications:  Atrial Fibrillation  Procedure Details:  Consent: Risks of procedure as well as the alternatives and risks of each were explained to the (patient/caregiver).  Consent for procedure obtained.  Time Out: Verified patient identification, verified procedure, site/side was marked, verified correct patient position, special equipment/implants available, medications/allergies/relevent history reviewed, required imaging and test results available.  Performed  Patient placed on cardiac monitor, pulse oximetry, supplemental oxygen as necessary.  Sedation given: Propofol per anesthesia Pacer pads placed anterior and posterior chest.  Cardioverted 1 time(s).  Cardioverted at 150J biphasic.  Impression: Findings: Post procedure EKG shows: NSR Complications: None Patient did tolerate procedure well.  Plan: 1. Successful DCCV to NSR with a single 150J biphasic shock. 2. Reduce amiodarone to 200 mg daily 3. Follow-up with Rudi Coco, NP in the afib clinic.  Time Spent Directly with the Patient:  30 minutes   Chrystie Nose, MD, [facility], FACP  [place]  [facility]  Medical Director of the Advanced Lipid Disorders &  Cardiovascular Risk Reduction Clinic Diplomate of the American Board of Clinical Lipidology Attending Cardiologist  Direct Dial: [phone]  Fax: [phone]  Website:  [email or web address] 07/07/2018, 9:28 AM

## 2018-07-07 NOTE — Discharge Instructions (Signed)
Electrical Cardioversion, Care After °This sheet gives you information about how to care for yourself after your procedure. Your health care provider may also give you more specific instructions. If you have problems or questions, contact your health care provider. °What can I expect after the procedure? °After the procedure, it is common to have: °· Some redness on the skin where the shocks were given. ° °Follow these instructions at home: °· Do not drive for 24 hours if you were given a medicine to help you relax (sedative). °· Take over-the-counter and prescription medicines only as told by your health care provider. °· Ask your health care provider how to check your pulse. Check it often. °· Rest for 48 hours after the procedure or as told by your health care provider. °· Avoid or limit your caffeine use as told by your health care provider. °Contact a health care provider if: °· You feel like your heart is beating too quickly or your pulse is not regular. °· You have a serious muscle cramp that does not go away. °Get help right away if: °· You have discomfort in your chest. °· You are dizzy or you feel faint. °· You have trouble breathing or you are short of breath. °· Your speech is slurred. °· You have trouble moving an arm or leg on one side of your body. °· Your fingers or toes turn cold or blue. °This information is not intended to replace advice given to you by your health care provider. Make sure you discuss any questions you have with your health care provider. °Document Released: 07/15/2013 Document Revised: 04/27/2016 Document Reviewed: 03/30/2016 °Elsevier Interactive Patient Education © 2018 [employer] ° °

## 2018-07-07 NOTE — Anesthesia Postprocedure Evaluation (Signed)
Anesthesia Post Note  Patient: Charlotte [name]  Procedure(s) Performed: CARDIOVERSION (N/A )     Patient location during evaluation: Endoscopy Anesthesia Type: General    Last Vitals:  Vitals:   07/07/18 0945 07/07/18 0955  BP: (!) 151/96 (!) 180/86  Pulse: 65 67  Resp: (!) 22 19  Temp:    SpO2: 96% 98%    Last Pain:  Vitals:   07/07/18 0955  TempSrc:   PainSc: 0-No pain                 Trevor Iha

## 2018-07-14 ENCOUNTER — Ambulatory Visit (HOSPITAL_COMMUNITY)
Admission: RE | Admit: 2018-07-14 | Discharge: 2018-07-14 | Disposition: A | Discharge status: Home / Self Care | Payer: Medicare HMO | Source: Ambulatory Visit | Attending: Nurse Practitioner | Admitting: Nurse Practitioner

## 2018-07-14 VITALS — BP 182/92 | HR 67 | Ht 62.0 in | Wt 274.0 lb

## 2018-07-14 DIAGNOSIS — K219 Gastro-esophageal reflux disease without esophagitis: Secondary | ICD-10-CM | POA: Diagnosis not present

## 2018-07-14 DIAGNOSIS — I110 Hypertensive heart disease with heart failure: Secondary | ICD-10-CM | POA: Insufficient documentation

## 2018-07-14 DIAGNOSIS — Z7901 Long term (current) use of anticoagulants: Secondary | ICD-10-CM | POA: Insufficient documentation

## 2018-07-14 DIAGNOSIS — Z888 Allergy status to other drugs, medicaments and biological substances status: Secondary | ICD-10-CM | POA: Insufficient documentation

## 2018-07-14 DIAGNOSIS — I4819 Other persistent atrial fibrillation: Secondary | ICD-10-CM | POA: Diagnosis not present

## 2018-07-14 DIAGNOSIS — R7303 Prediabetes: Secondary | ICD-10-CM | POA: Diagnosis not present

## 2018-07-14 DIAGNOSIS — E785 Hyperlipidemia, unspecified: Secondary | ICD-10-CM | POA: Insufficient documentation

## 2018-07-14 DIAGNOSIS — I447 Left bundle-branch block, unspecified: Secondary | ICD-10-CM | POA: Diagnosis not present

## 2018-07-14 DIAGNOSIS — Z87891 Personal history of nicotine dependence: Secondary | ICD-10-CM | POA: Diagnosis not present

## 2018-07-14 DIAGNOSIS — E559 Vitamin D deficiency, unspecified: Secondary | ICD-10-CM | POA: Diagnosis not present

## 2018-07-14 DIAGNOSIS — I5022 Chronic systolic (congestive) heart failure: Secondary | ICD-10-CM | POA: Diagnosis not present

## 2018-07-14 DIAGNOSIS — Z79899 Other long term (current) drug therapy: Secondary | ICD-10-CM | POA: Insufficient documentation

## 2018-07-14 DIAGNOSIS — I2510 Atherosclerotic heart disease of native coronary artery without angina pectoris: Secondary | ICD-10-CM | POA: Insufficient documentation

## 2018-07-14 NOTE — Patient Instructions (Addendum)
For the next 3 days increase lasix to 40mg  (2 of your 20mg  tabs) then return to normal dosing of 20mg  a day   Low-Sodium Eating Plan Sodium, which is an element that makes up salt, helps you maintain a healthy balance of fluids in your body. Too much sodium can increase your blood pressure and cause fluid and waste to be held in your body. Your health care provider or dietitian may recommend following this plan if you have high blood pressure (hypertension), kidney disease, liver disease, or heart failure. Eating less sodium can help lower your blood pressure, reduce swelling, and protect your heart, liver, and kidneys. What are tips for following this plan? General guidelines  Most people on this plan should limit their sodium intake to 1,500-2,000 mg (milligrams) of sodium each day. Reading food labels  The Nutrition Facts label lists the amount of sodium in one serving of the food. If you eat more than one serving, you must multiply the listed amount of sodium by the number of servings.  Choose foods with less than 140 mg of sodium per serving.  Avoid foods with 300 mg of sodium or more per serving. Shopping  Look for lower-sodium products, often labeled as "low-sodium" or "no salt added."  Always check the sodium content even if foods are labeled as "unsalted" or "no salt added".  Buy fresh foods. ? Avoid canned foods and premade or frozen meals. ? Avoid canned, cured, or processed meats  Buy breads that have less than 80 mg of sodium per slice. Cooking  Eat more home-cooked food and less restaurant, buffet, and fast food.  Avoid adding salt when cooking. Use salt-free seasonings or herbs instead of table salt or sea salt. Check with your health care provider or pharmacist before using salt substitutes.  Cook with plant-based oils, such as canola, sunflower, or olive oil. Meal planning  When eating at a restaurant, ask that your food be prepared with less salt or no salt,  if possible.  Avoid foods that contain MSG (monosodium glutamate). MSG is sometimes added to [place] food, bouillon, and some canned foods. What foods are recommended? The items listed may not be a complete list. Talk with your dietitian about what dietary choices are best for you. Grains Low-sodium cereals, including oats, puffed wheat and rice, and shredded wheat. Low-sodium crackers. Unsalted rice. Unsalted pasta. Low-sodium bread. Whole-grain breads and whole-grain pasta. Vegetables Fresh or frozen vegetables. "No salt added" canned vegetables. "No salt added" tomato sauce and paste. Low-sodium or reduced-sodium tomato and vegetable juice. Fruits Fresh, frozen, or canned fruit. Fruit juice. Meats and other protein foods Fresh or frozen (no salt added) meat, poultry, seafood, and fish. Low-sodium canned tuna and salmon. Unsalted nuts. Dried peas, beans, and lentils without added salt. Unsalted canned beans. Eggs. Unsalted nut butters. Dairy Milk. Soy milk. Cheese that is naturally low in sodium, such as ricotta cheese, fresh mozzarella, or Swiss cheese Low-sodium or reduced-sodium cheese. Cream cheese. Yogurt. Fats and oils Unsalted butter. Unsalted margarine with no trans fat. Vegetable oils such as canola or olive oils. Seasonings and other foods Fresh and dried herbs and spices. Salt-free seasonings. Low-sodium mustard and ketchup. Sodium-free salad dressing. Sodium-free light mayonnaise. Fresh or refrigerated horseradish. Lemon juice. Vinegar. Homemade, reduced-sodium, or low-sodium soups. Unsalted popcorn and pretzels. Low-salt or salt-free chips. What foods are not recommended? The items listed may not be a complete list. Talk with your dietitian about what dietary choices are best for you. Grains Instant hot  cereals. Bread stuffing, pancake, and biscuit mixes. Croutons. Seasoned rice or pasta mixes. Noodle soup cups. Boxed or frozen macaroni and cheese. Regular salted crackers.  Self-rising flour. Vegetables Sauerkraut, pickled vegetables, and relishes. Olives. [place] fries. Onion rings. Regular canned vegetables (not low-sodium or reduced-sodium). Regular canned tomato sauce and paste (not low-sodium or reduced-sodium). Regular tomato and vegetable juice (not low-sodium or reduced-sodium). Frozen vegetables in sauces. Meats and other protein foods Meat or fish that is salted, canned, smoked, spiced, or pickled. Bacon, ham, sausage, hotdogs, corned beef, chipped beef, packaged lunch meats, salt pork, jerky, pickled herring, anchovies, regular canned tuna, sardines, salted nuts. Dairy Processed cheese and cheese spreads. Cheese curds. Blue cheese. Feta cheese. String cheese. Regular cottage cheese. Buttermilk. Canned milk. Fats and oils Salted butter. Regular margarine. Ghee. Bacon fat. Seasonings and other foods Onion salt, garlic salt, seasoned salt, table salt, and sea salt. Canned and packaged gravies. Worcestershire sauce. Tartar sauce. Barbecue sauce. Teriyaki sauce. Soy sauce, including reduced-sodium. Steak sauce. Fish sauce. Oyster sauce. Cocktail sauce. Horseradish that you find on the shelf. Regular ketchup and mustard. Meat flavorings and tenderizers. Bouillon cubes. Hot sauce and Tabasco sauce. Premade or packaged marinades. Premade or packaged taco seasonings. Relishes. Regular salad dressings. Salsa. Potato and tortilla chips. Corn chips and puffs. Salted popcorn and pretzels. Canned or dried soups. Pizza. Frozen entrees and pot pies. Summary  Eating less sodium can help lower your blood pressure, reduce swelling, and protect your heart, liver, and kidneys.  Most people on this plan should limit their sodium intake to 1,500-2,000 mg (milligrams) of sodium each day.  Canned, boxed, and frozen foods are high in sodium. Restaurant foods, fast foods, and pizza are also very high in sodium. You also get sodium by adding salt to food.  Try to cook at home, eat  more fresh fruits and vegetables, and eat less fast food, canned, processed, or prepared foods. This information is not intended to replace advice given to you by your health care provider. Make sure you discuss any questions you have with your health care provider. Document Released: 03/16/2002 Document Revised: 09/17/2016 Document Reviewed: 09/17/2016 Elsevier Interactive Patient Education  [employer].

## 2018-07-15 NOTE — Progress Notes (Signed)
Primary Care Physician: [name], MD Referring Physician: Dr. Levander Campion Labella is a 82 y.o. female  with h/oPAF, nonobstructive CAD, chronic systolic heart failure with normalization of EF, HTN, HLD, and DM II. She was hospitalized twice in August,2018, first for GI bleeding and then for lumbar spine compression fracture. Cardiology F/U EKG obtained on 07/12/2017 showed atrial fibrillation, Sheunderwent successful DC cardioversion on 07/16/2017 after 3 shocks, one at 150 J and twice at 200 J. She was seen by Cecilie Kicks, NP 05/2018 and was found back in afib and discussed with Dr. Johnsie Cancel. She was set up for echo and stress test.. She was seen in  afib clinic to further discuss antiarrythmic therapy. She is tired and short of breath with afib.  Pt decided to start amiodarone load with a f/u cardioversion. She is now one week s/p successful cardioversion and continues in SR. She feels improved with more energy, mores shortness of breath. Her BP is elevated today and she is asking for low salt recommendations as she is not very careful with salt in her diet.. She has also noted some ankle swelling.  Today, she denies symptoms of palpitations, chest pain, shortness of breath, orthopnea, PND, lower extremity edema, dizziness, presyncope, syncope, or neurologic sequela. The patient is tolerating medications without difficulties and is otherwise without complaint today.   Past Medical History:  Diagnosis Date  . Aortic insufficiency   . Atrial fibrillation ([place])    a. s/p DCCV 03/2013  . CAD (coronary artery disease)    LHC 06/2010: EF 55%, mild plaque in the LAD 20-30%, otherwise normal coronary arteries  . CATARACTS   . Chronic systolic heart failure ([place])    in setting of AFib;  Echocardiogram 02/2013: Moderate LVH, EF 25-30%, anteroseptal and apical HK, moderate AI, MAC, moderate MR, moderate LAE, mild RAE, PASP 32 => after DCCV f/u Echo 8/14: Moderate LVH, EF 60-65%, normal wall  motion, grade 1 diastolic dysfunction, moderate AI, moderate LAE  . Complication of anesthesia 2009   nausea and vomitting  . GERD (gastroesophageal reflux disease)   . HYPERLIPIDEMIA, MILD   . HYPERTENSION, BENIGN SYSTEMIC   . Irritable bowel syndrome   . LBBB (left bundle branch block)   . MEDIAL EPICONDYLITIS   . NSVT (nonsustained ventricular tachycardia) ([place])    during Dob Echo 2011 => normal cath  . OSTEOARTHRITIS   . OSTEOPENIA   . PONV (postoperative nausea and vomiting)   . Pre-diabetes   . RHINITIS, ALLERGIC   . SYNCOPE   . VITAMIN D DEFICIENCY    Past Surgical History:  Procedure Laterality Date  . CARDIOVERSION N/A 04/01/2013   Procedure: CARDIOVERSION;  Surgeon: Josue Hector, MD;  Location: [facility] ENDOSCOPY;  Service: Cardiovascular;  Laterality: N/A;  . CARDIOVERSION N/A 07/16/2017   Procedure: CARDIOVERSION;  Surgeon: Pixie Casino, MD;  Location: [facility] ENDOSCOPY;  Service: Cardiovascular;  Laterality: N/A;  . CARDIOVERSION N/A 07/07/2018   Procedure: CARDIOVERSION;  Surgeon: Pixie Casino, MD;  Location: [place];  Service: Cardiovascular;  Laterality: N/A;  . CATARACT EXTRACTION, BILATERAL  2012   Dr. Herbert Deaner  . COLONOSCOPY WITH PROPOFOL N/A 05/23/2017   Procedure: COLONOSCOPY WITH PROPOFOL;  Surgeon: Wonda Horner, MD;  Location: [facility] ENDOSCOPY;  Service: Endoscopy;  Laterality: N/A;  . ESOPHAGOGASTRODUODENOSCOPY (EGD) WITH PROPOFOL N/A 05/23/2017   Procedure: ESOPHAGOGASTRODUODENOSCOPY (EGD) WITH PROPOFOL;  Surgeon: Wonda Horner, MD;  Location: [facility] ENDOSCOPY;  Service: Endoscopy;  Laterality: N/A;  .  IR KYPHO LUMBAR INC FX REDUCE BONE BX UNI/BIL CANNULATION INC/IMAGING  06/06/2017  . IR RADIOLOGIST EVAL & MGMT  07/01/2017  . RADICAL HYSTERECTOMY    . REPLACEMENT TOTAL KNEE     11/03/07 right....... 03/29/08 left    Current Outpatient Medications  Medication Sig Dispense Refill  . acetaminophen (TYLENOL) 500 MG tablet Take 1,000 mg by mouth 3 (three) times  daily.    Marland Kitchen amiodarone (PACERONE) 200 MG tablet Take 1 tablet (200 mg total) by mouth daily. 90 tablet 2  . Calcium Carb-Cholecalciferol (CALCIUM 600/VITAMIN D3) 600-800 MG-UNIT TABS Take 1 tablet by mouth every evening.     . carvedilol (COREG) 12.5 MG tablet Take 1 tablet (12.5 mg total) by mouth 2 (two) times daily. 180 tablet 3  . Cholecalciferol (VITAMIN D PO) Take 1 tablet by mouth every evening.     . furosemide (LASIX) 20 MG tablet Take 1 tablet (20 mg total) by mouth daily. 1 tablet 0  . gabapentin (NEURONTIN) 300 MG capsule TAKE 1 CAPSULE FOUR TIMES DAILY (Patient taking differently: Take 300 mg by mouth 3 (three) times daily. ) 360 capsule 1  . losartan (COZAAR) 25 MG tablet Take 1 tablet (25 mg total) by mouth daily. 90 tablet 3  . Multiple Vitamin (MULTIVITAMIN WITH MINERALS) TABS tablet Take 1 tablet by mouth every evening.     . Omega-3 Fatty Acids (FISH OIL PO) Take 1 capsule by mouth daily.     . potassium chloride (K-DUR,KLOR-CON) 10 MEQ tablet Take 1 tablet (10 mEq total) by mouth 2 (two) times daily. 180 tablet 3  . Psyllium (METAMUCIL PO) Take 5 mLs by mouth at bedtime as needed (constipation). Mix in 8 oz liquid and drink     . rivaroxaban (XARELTO) 20 MG TABS tablet Take 1 tablet (20 mg total) by mouth daily with supper. 30 tablet 1  . vitamin B-12 (CYANOCOBALAMIN) 1000 MCG tablet Take 1,000 mcg by mouth every evening.     . polyvinyl alcohol-povidone (REFRESH) 1.4-0.6 % ophthalmic solution Place 1-2 drops into both eyes daily as needed (dry eyes).      No current facility-administered medications for this encounter.     Allergies  Allergen Reactions  . Amlodipine     Other reaction(s): HEADACHE  . Metformin And Related Other (See Comments)    Renal failure  . Amlodipine Besylate-Valsartan Other (See Comments)    headache    Social History   Socioeconomic History  . Marital status: Divorced    Spouse name: Not on file  . Number of children: 1  . Years of  education: Not on file  . Highest education level: Not on file  Occupational History  . Occupation: Retired    Fish farm manager: RETIRED  Social Needs  . Financial resource strain: Not on file  . Food insecurity:    Worry: Not on file    Inability: Not on file  . Transportation needs:    Medical: Not on file    Non-medical: Not on file  Tobacco Use  . Smoking status: Former Smoker    Last attempt to quit: 10/08/1970    Years since quitting: 47.8  . Smokeless tobacco: Never Used  Substance and Sexual Activity  . Alcohol use: No  . Drug use: No  . Sexual activity: Not on file  Lifestyle  . Physical activity:    Days per week: Not on file    Minutes per session: Not on file  . Stress: Not on file  Relationships  . Social connections:    Talks on phone: Not on file    Gets together: Not on file    Attends religious service: Not on file    Active member of club or organization: Not on file    Attends meetings of clubs or organizations: Not on file    Relationship status: Not on file  . Intimate partner violence:    Fear of current or ex partner: Not on file    Emotionally abused: Not on file    Physically abused: Not on file    Forced sexual activity: Not on file  Other Topics Concern  . Not on file  Social History Narrative  . Not on file    Family History  Problem Relation Age of Onset  . Arrhythmia Mother        afib  . Alzheimer's disease Mother   . Colon cancer Father   . Heart attack Father 20  . Diabetes Sister   . Arrhythmia Sister        afib    ROS- All systems are reviewed and negative except as per the HPI above  Physical Exam: Vitals:   07/14/18 1502  BP: (!) 182/92  Pulse: 67  Weight: 124.3 kg  Height: _0  (1.575 m)   Wt Readings from Last 3 Encounters:  07/14/18 124.3 kg  07/07/18 125.2 kg  06/25/18 125.2 kg    Labs: Lab Results  Component Value Date   NA 140 07/07/2018   K 4.8 07/07/2018   CL 109 06/25/2018   CO2 24 06/25/2018    GLUCOSE 115 (H) 07/07/2018   BUN 24 (H) 06/25/2018   CREATININE 0.98 06/25/2018   CALCIUM 9.1 06/25/2018   PHOS 4.2 06/02/2017   MG 1.9 07/12/2017   Lab Results  Component Value Date   INR 1.01 06/06/2017   Lab Results  Component Value Date   CHOL 193 03/01/2017   HDL 54 03/01/2017   LDLCALC 124 (H) 03/01/2017   TRIG 76 03/01/2017     GEN- The patient is well appearing, alert and oriented x 3 today.   Head- normocephalic, atraumatic Eyes-  Sclera clear, conjunctiva pink Ears- hearing intact Oropharynx- clear Neck- supple, no JVP Lymph- no cervical lymphadenopathy Lungs- Clear to ausculation bilaterally, normal work of breathing Heart-   regular rate and rhythm, no murmurs, rubs or gallops, PMI not laterally displaced GI- soft, NT, ND, + BS Extremities- no clubbing, cyanosis,   1+ edema MS- no significant deformity or atrophy Skin- no rash or lesion Psych- euthymic mood, full affect Neuro- strength and sensation are intact  EKG-SR with rare PVC, pr int 204 ms, qrs int 140 ms, qtc 469 ms Echo-Study Conclusions  - Left ventricle: The cavity size was normal. There was mild focal   basal hypertrophy of the septum. Systolic function was mildly   reduced. The estimated ejection fraction was in the range of 45%   to 50%. Wall motion was normal; there were no regional wall   motion abnormalities. - Aortic valve: Transvalvular velocity was within the normal range.   There was no stenosis. There was mild to moderate regurgitation. - Aorta: Ascending aortic diameter: 37.43 mm (S). - Ascending aorta: The ascending aorta was mildly dilated. - Mitral valve: Transvalvular velocity was within the normal range.   There was no evidence for stenosis. There was mild regurgitation. - Left atrium: The atrium was mildly dilated. - Right ventricle: The cavity size was normal. Wall thickness was  normal. Systolic function was normal. - Atrial septum: No defect or patent foramen ovale was  identified   by color flow Doppler. - Tricuspid valve: There was mild regurgitation. - Pulmonary arteries: Systolic pressure was within the normal   range. PA peak pressure: 33 mm Hg (S).  Impressions:  - Compared with the echo 03/599, systolic function is reduced.  Stress test 8/15- Notes recorded by Jerline Pain, MD on 05/22/2018 at 4:38 PM EDT 1. EF 48%, septal hypokinesis.  2. Fixed small, mild mid to apical anteroseptal perfusion defect.   The septal hypokinesis and the fixed septal defect are due to LBBB. There is no ischemia. Low risk study.   Candee Furbish, MD   Assessment and Plan: 1. Persistent  afib Pt is now past amiodarone loading is on 200 mg qd with a successful cardioversion and maintainting SR Continue xarelto 20 mg daily with a CHA2DS2VASc score of at least 6  2. Elevated BP/LLE Avoid salt Elevate legs when sitting Diet info regarding low sodium given to pt Will increase lasix 20 mg to 40 mg daily x 3 days and then return to 20 mg daily If BP remains elevated may need to increase losartan to 50 mg daily  F/u with Dr. Johnsie Cancel in 8 weeks afib clinic as needed  Butch Penny C. Andra Heslin, [facility] [address] [place], [place] [address] [phone]

## 2018-08-11 ENCOUNTER — Ambulatory Visit (INDEPENDENT_AMBULATORY_CARE_PROVIDER_SITE_OTHER): Payer: Medicare HMO | Admitting: Family Medicine

## 2018-08-11 ENCOUNTER — Encounter: Payer: Self-pay | Admitting: Family Medicine

## 2018-08-11 VITALS — BP 140/76 | HR 59 | Ht 63.0 in | Wt 278.0 lb

## 2018-08-11 DIAGNOSIS — I10 Essential (primary) hypertension: Secondary | ICD-10-CM

## 2018-08-11 DIAGNOSIS — W19XXXA Unspecified fall, initial encounter: Secondary | ICD-10-CM

## 2018-08-11 DIAGNOSIS — E1169 Type 2 diabetes mellitus with other specified complication: Secondary | ICD-10-CM | POA: Diagnosis not present

## 2018-08-11 DIAGNOSIS — Z23 Encounter for immunization: Secondary | ICD-10-CM | POA: Diagnosis not present

## 2018-08-11 DIAGNOSIS — M545 Low back pain, unspecified: Secondary | ICD-10-CM

## 2018-08-11 DIAGNOSIS — G8929 Other chronic pain: Secondary | ICD-10-CM

## 2018-08-11 DIAGNOSIS — M48061 Spinal stenosis, lumbar region without neurogenic claudication: Secondary | ICD-10-CM | POA: Diagnosis not present

## 2018-08-11 DIAGNOSIS — E669 Obesity, unspecified: Secondary | ICD-10-CM | POA: Diagnosis not present

## 2018-08-11 LAB — POCT GLYCOSYLATED HEMOGLOBIN (HGB A1C): Hemoglobin A1C: 5.9 % — AB (ref 4.0–5.6)

## 2018-08-11 MED ORDER — AMBULATORY NON FORMULARY MEDICATION: 0 refills | Status: DC

## 2018-08-11 NOTE — Progress Notes (Signed)
Subjective:    CC:   HPI:  Diabetes - no hypoglycemic events. No wounds or sores that are not healing well. No increased thirst or urination. Checking glucose at home. Taking medications as prescribed without any side effects.  Hypertension- Pt denies chest pain, SOB, dizziness, or heart palpitations.  Taking meds as directed w/o problems.  Denies medication side effects.    She recently had a cardioversion for afib on 07/07/2018.  She says that her follow-up she was actually placed on amiodarone.  She says it makes her feel very tired and she feels like it makes her little short of breath as well.  Though she does feel little better on it this week compared to last week.  Also really been struggling with back pain in fact she is started physical therapy through [facility] when she started having problems with A. fib.  Even the therapist at that time noticed that she was incredibly short of breath with activity.  She says that lifting her heavy Rollator aggravates her back and wants to know if she can get a new prescription for may be a lightweight aluminum Rollator or possibly walker.  Past medical history, Surgical history, Family history not pertinant except as noted below, Social history, Allergies, and medications have been entered into the medical record, reviewed, and corrections made.   Review of Systems: No fevers, chills, night sweats, weight loss, chest pain, or shortness of breath.   Objective:    General: Well Developed, well nourished, and in no acute distress.  Neuro: Alert and oriented x3, extra-ocular muscles intact, sensation grossly intact.  HEENT: Normocephalic, atraumatic  Skin: Warm and dry, no rashes. Cardiac: Regular rate and rhythm, no murmurs rubs or gallops, no lower extremity edema.  Respiratory: Clear to auscultation bilaterally. Not using accessory muscles, speaking in full sentences.   Impression and Recommendations:    Diabetes - ON an ARB.  Control.   Hemoglobin A1c 5.9 today.  Continue work on [employer] and regular exercise and be cautious with dietary choices through the holidays.  Follow-up in 4 months.  HTN -borderline today.  Though she says she actually took her medications late this morning.  Afib - in rhythm today and pulse well controlled.  Did encourage her to follow-up with cardiology in regards to the amiodarone.  Because of increased fatigue and shortness of breath she might be able to actually adjust her dose.  Scription given for shingles vaccine to have this done at the pharmacy.  Back Pain-did encourage her to follow back up with [facility].  Now that her A. fib is under control she might want to even consider going back for couple more physical therapy sessions.  She says she went for 4 and felt like each time it actually aggravated her back.  Did give a new prescription for lightweight Rollator and/or walker.

## 2018-08-18 ENCOUNTER — Telehealth: Payer: Self-pay | Admitting: Cardiovascular Disease

## 2018-08-18 DIAGNOSIS — R69 Illness, unspecified: Secondary | ICD-10-CM | POA: Diagnosis not present

## 2018-08-18 NOTE — Telephone Encounter (Signed)
Will forward to Pharm D to see if CBD oil is okay to take.

## 2018-08-18 NOTE — Telephone Encounter (Signed)
New Message:   Patient calling because her back is hurting and wanted to know if she can use CBD oil. Please call back.

## 2018-08-19 NOTE — Telephone Encounter (Signed)
If applying topically will be ok. However, if ingesting would recommend against as it could increase the bleeding with Xarelto and may increase levels of amiodarone.

## 2018-08-19 NOTE — Telephone Encounter (Signed)
Spoke with the pt and informed her of our Pharmacist Conrad [place] recommendations using CBD oil with her existing cardiac meds she is taking.  Advised her of risk factors associated with ingesting CBD oil vs applying it topically.  Pt verbalized understanding and agreed to recommendations provided.

## 2018-09-03 ENCOUNTER — Ambulatory Visit (HOSPITAL_COMMUNITY)
Admission: RE | Admit: 2018-09-03 | Discharge: 2018-09-03 | Disposition: A | Discharge status: Home / Self Care | Payer: Medicare HMO | Source: Ambulatory Visit | Attending: Nurse Practitioner | Admitting: Nurse Practitioner

## 2018-09-03 ENCOUNTER — Encounter (HOSPITAL_COMMUNITY): Payer: Self-pay | Admitting: Nurse Practitioner

## 2018-09-03 VITALS — BP 122/70 | HR 68 | Ht 63.0 in | Wt 269.0 lb

## 2018-09-03 DIAGNOSIS — I447 Left bundle-branch block, unspecified: Secondary | ICD-10-CM | POA: Diagnosis not present

## 2018-09-03 DIAGNOSIS — I2510 Atherosclerotic heart disease of native coronary artery without angina pectoris: Secondary | ICD-10-CM | POA: Insufficient documentation

## 2018-09-03 DIAGNOSIS — E559 Vitamin D deficiency, unspecified: Secondary | ICD-10-CM | POA: Diagnosis not present

## 2018-09-03 DIAGNOSIS — E785 Hyperlipidemia, unspecified: Secondary | ICD-10-CM | POA: Diagnosis not present

## 2018-09-03 DIAGNOSIS — Z8249 Family history of ischemic heart disease and other diseases of the circulatory system: Secondary | ICD-10-CM | POA: Diagnosis not present

## 2018-09-03 DIAGNOSIS — Z888 Allergy status to other drugs, medicaments and biological substances status: Secondary | ICD-10-CM | POA: Insufficient documentation

## 2018-09-03 DIAGNOSIS — Z79899 Other long term (current) drug therapy: Secondary | ICD-10-CM | POA: Insufficient documentation

## 2018-09-03 DIAGNOSIS — Z96653 Presence of artificial knee joint, bilateral: Secondary | ICD-10-CM | POA: Diagnosis not present

## 2018-09-03 DIAGNOSIS — K589 Irritable bowel syndrome without diarrhea: Secondary | ICD-10-CM | POA: Diagnosis not present

## 2018-09-03 DIAGNOSIS — I4891 Unspecified atrial fibrillation: Secondary | ICD-10-CM

## 2018-09-03 DIAGNOSIS — R7303 Prediabetes: Secondary | ICD-10-CM | POA: Insufficient documentation

## 2018-09-03 DIAGNOSIS — K219 Gastro-esophageal reflux disease without esophagitis: Secondary | ICD-10-CM | POA: Diagnosis not present

## 2018-09-03 DIAGNOSIS — I5022 Chronic systolic (congestive) heart failure: Secondary | ICD-10-CM | POA: Diagnosis not present

## 2018-09-03 DIAGNOSIS — Z90710 Acquired absence of both cervix and uterus: Secondary | ICD-10-CM | POA: Diagnosis not present

## 2018-09-03 DIAGNOSIS — Z7901 Long term (current) use of anticoagulants: Secondary | ICD-10-CM | POA: Diagnosis not present

## 2018-09-03 DIAGNOSIS — I4819 Other persistent atrial fibrillation: Secondary | ICD-10-CM | POA: Diagnosis not present

## 2018-09-03 DIAGNOSIS — I110 Hypertensive heart disease with heart failure: Secondary | ICD-10-CM | POA: Insufficient documentation

## 2018-09-03 DIAGNOSIS — M7989 Other specified soft tissue disorders: Secondary | ICD-10-CM | POA: Insufficient documentation

## 2018-09-03 DIAGNOSIS — I440 Atrioventricular block, first degree: Secondary | ICD-10-CM | POA: Diagnosis not present

## 2018-09-03 DIAGNOSIS — Z87891 Personal history of nicotine dependence: Secondary | ICD-10-CM | POA: Diagnosis not present

## 2018-09-03 LAB — CBC
HEMATOCRIT: 43.3 % (ref 36.0–46.0)
Hemoglobin: 13.4 g/dL (ref 12.0–15.0)
MCH: 29.5 pg (ref 26.0–34.0)
MCHC: 30.9 g/dL (ref 30.0–36.0)
MCV: 95.4 fL (ref 80.0–100.0)
NRBC: 0 % (ref 0.0–0.2)
Platelets: 208 10*3/uL (ref 150–400)
RBC: 4.54 MIL/uL (ref 3.87–5.11)
RDW: 13.5 % (ref 11.5–15.5)
WBC: 5.2 10*3/uL (ref 4.0–10.5)

## 2018-09-03 LAB — COMPREHENSIVE METABOLIC PANEL
ALBUMIN: 3.8 g/dL (ref 3.5–5.0)
ALK PHOS: 56 U/L (ref 38–126)
ALT: 30 U/L (ref 0–44)
ANION GAP: 7 (ref 5–15)
AST: 36 U/L (ref 15–41)
BILIRUBIN TOTAL: 1 mg/dL (ref 0.3–1.2)
BUN: 24 mg/dL — ABNORMAL HIGH (ref 8–23)
CALCIUM: 9.4 mg/dL (ref 8.9–10.3)
CO2: 25 mmol/L (ref 22–32)
Chloride: 103 mmol/L (ref 98–111)
Creatinine, Ser: 0.92 mg/dL (ref 0.44–1.00)
GFR calc non Af Amer: 59 mL/min — ABNORMAL LOW (ref >60)
Glucose, Bld: 106 mg/dL — ABNORMAL HIGH (ref 70–99)
POTASSIUM: 4.7 mmol/L (ref 3.5–5.1)
SODIUM: 135 mmol/L (ref 135–145)
TOTAL PROTEIN: 7.4 g/dL (ref 6.5–8.1)

## 2018-09-03 LAB — TSH: TSH: 3.392 u[IU]/mL (ref 0.350–4.500)

## 2018-09-03 MED ORDER — AMIODARONE HCL 200 MG PO TABS: 100.0000 mg | ORAL_TABLET | Freq: Every day | ORAL | 2 refills | Status: DC

## 2018-09-03 NOTE — Progress Notes (Addendum)
Primary Care Physician: Charlotte Marry, MD Referring Physician: Dr. Levander Charlotte [name] is a 82 y.o. female  with h/oPAF, nonobstructive CAD, chronic systolic heart failure with normalization of EF, HTN, HLD, and DM II. She was hospitalized twice in August,2018, first for GI bleeding and then for lumbar spine compression fracture. Cardiology F/U EKG obtained on 07/12/2017 showed atrial fibrillation, Sheunderwent successful DC cardioversion on 07/16/2017 after 3 shocks, one at 150 J and twice at 200 J. She was seen by Cecilie Kicks, NP 05/2018 and was found back in afib and discussed with Dr. Johnsie [name]. She was set up for echo and stress test.. She was seen in  afib clinic to further discuss antiarrythmic therapy. She is tired and short of breath with afib.  Pt decided to start amiodarone load with a f/u cardioversion. She is now one week s/p successful cardioversion, 9/30 and continues in SR. She feels improved with more energy, mores shortness of breath. Her BP is elevated today and she is asking for low salt recommendations as she is not very careful with salt in her diet.. She has also noted some ankle swelling.  F/u in afib clinic,11/27. She   wanted to  discuss some rt leg weakness that she has been having.  She  read that amiodarone can cause muscle weakness and further discuss if coming from  amiodarone. She apparently has significant back problems and has been undergoing physical therapy but recently stopped it as she felt like it was not helping. She is finding it harder to ambulate from  the pain and weakness in her rt leg.She states that she still feels better overall in SR, shortness of breath improved but has chronic shortness of breath.   Today, she denies symptoms of palpitations, chest pain, shortness of breath, orthopnea, PND, lower extremity edema, dizziness, presyncope, syncope, or neurologic sequela. The patient is tolerating medications without difficulties and is otherwise  without complaint today.   Past Medical History:  Diagnosis Date  . Aortic insufficiency   . Atrial fibrillation ([place])    a. s/p DCCV 03/2013  . CAD (coronary artery disease)    LHC 06/2010: EF 55%, mild plaque in the LAD 20-30%, otherwise normal coronary arteries  . CATARACTS   . Chronic systolic heart failure ([place])    in setting of AFib;  Echocardiogram 02/2013: Moderate LVH, EF 25-30%, anteroseptal and apical HK, moderate AI, MAC, moderate MR, moderate LAE, mild RAE, PASP 32 => after DCCV f/u Echo 8/14: Moderate LVH, EF 60-65%, normal wall motion, grade 1 diastolic dysfunction, moderate AI, moderate LAE  . Complication of anesthesia 2009   nausea and vomitting  . GERD (gastroesophageal reflux disease)   . HYPERLIPIDEMIA, MILD   . HYPERTENSION, BENIGN SYSTEMIC   . Irritable bowel syndrome   . LBBB (left bundle branch block)   . MEDIAL EPICONDYLITIS   . NSVT (nonsustained ventricular tachycardia) ([place])    during Dob Echo 2011 => normal cath  . OSTEOARTHRITIS   . OSTEOPENIA   . PONV (postoperative nausea and vomiting)   . Pre-diabetes   . RHINITIS, ALLERGIC   . SYNCOPE   . VITAMIN D DEFICIENCY    Past Surgical History:  Procedure Laterality Date  . CARDIOVERSION N/A 04/01/2013   Procedure: CARDIOVERSION;  Surgeon: Josue Hector, MD;  Location: [facility] ENDOSCOPY;  Service: Cardiovascular;  Laterality: N/A;  . CARDIOVERSION N/A 07/16/2017   Procedure: CARDIOVERSION;  Surgeon: Pixie Casino, MD;  Location: [facility];  Service:  Cardiovascular;  Laterality: N/A;  . CARDIOVERSION N/A 07/07/2018   Procedure: CARDIOVERSION;  Surgeon: Pixie Casino, MD;  Location: [place];  Service: Cardiovascular;  Laterality: N/A;  . CATARACT EXTRACTION, BILATERAL  2012   Dr. Herbert Deaner  . COLONOSCOPY WITH PROPOFOL N/A 05/23/2017   Procedure: COLONOSCOPY WITH PROPOFOL;  Surgeon: Wonda Horner, MD;  Location: [facility] ENDOSCOPY;  Service: Endoscopy;  Laterality: N/A;  . ESOPHAGOGASTRODUODENOSCOPY (EGD)  WITH PROPOFOL N/A 05/23/2017   Procedure: ESOPHAGOGASTRODUODENOSCOPY (EGD) WITH PROPOFOL;  Surgeon: Wonda Horner, MD;  Location[facility] ENDOSCOPY;  Service: Endoscopy;  Laterality: N/A;  . IR KYPHO LUMBAR INC FX REDUCE BONE BX UNI/BIL CANNULATION INC/IMAGING  06/06/2017  . IR RADIOLOGIST EVAL & MGMT  07/01/2017  . RADICAL HYSTERECTOMY    . REPLACEMENT TOTAL KNEE     11/03/07 right....... 03/29/08 left    Current Outpatient Medications  Medication Sig Dispense Refill  . acetaminophen (TYLENOL) 500 MG tablet Take 1,000 mg by mouth 3 (three) times daily.    Marland Kitchen amiodarone (PACERONE) 200 MG tablet Take 0.5 tablets (100 mg total) by mouth daily. 90 tablet 2  . Calcium Carb-Cholecalciferol (CALCIUM 600/VITAMIN D3) 600-800 MG-UNIT TABS Take 1 tablet by mouth every evening.     . carvedilol (COREG) 12.5 MG tablet Take 1 tablet (12.5 mg total) by mouth 2 (two) times daily. 180 tablet 3  . Cholecalciferol (VITAMIN D PO) Take 1 tablet by mouth every evening.     . furosemide (LASIX) 20 MG tablet Take 1 tablet (20 mg total) by mouth daily. 1 tablet 0  . losartan (COZAAR) 25 MG tablet Take 1 tablet (25 mg total) by mouth daily. 90 tablet 3  . Multiple Vitamin (MULTIVITAMIN WITH MINERALS) TABS tablet Take 1 tablet by mouth every evening.     . Omega-3 Fatty Acids (FISH OIL PO) Take 1 capsule by mouth daily.     . polyvinyl alcohol-povidone (REFRESH) 1.4-0.6 % ophthalmic solution Place 1-2 drops into both eyes daily as needed (dry eyes).     . potassium chloride (K-DUR,KLOR-CON) 10 MEQ tablet Take 1 tablet (10 mEq total) by mouth 2 (two) times daily. 180 tablet 3  . Psyllium (METAMUCIL PO) Take 5 mLs by mouth at bedtime as needed (constipation). Mix in 8 oz liquid and drink     . rivaroxaban (XARELTO) 20 MG TABS tablet Take 1 tablet (20 mg total) by mouth daily with supper. 30 tablet 1  . vitamin B-12 (CYANOCOBALAMIN) 1000 MCG tablet Take 1,000 mcg by mouth every evening.     . AMBULATORY NON FORMULARY MEDICATION  Medication Name: Shingrix x 1 repeat in 2- 6 months (Patient not taking: Reported on 09/03/2018) 1 vial 0  . AMBULATORY NON FORMULARY MEDICATION Medication Name: Engineer, materials. DX: M48.046 (Patient not taking: Reported on 09/03/2018) 1 each 0  . AMBULATORY NON FORMULARY MEDICATION Medication Name: Sports administrator.LA:G53.646 (Patient not taking: Reported on 09/03/2018) 1 each 0  . gabapentin (NEURONTIN) 300 MG capsule TAKE 1 CAPSULE FOUR TIMES DAILY (Patient taking differently: Take 600 mg by mouth 2 (two) times daily. ) 360 capsule 1   No current facility-administered medications for this encounter.     Allergies  Allergen Reactions  . Amlodipine     Other reaction(s): HEADACHE  . Metformin And Related Other (See Comments)    Renal failure  . Amlodipine Besylate-Valsartan Other (See Comments)    headache    Social History   Socioeconomic History  . Marital status: Divorced    Spouse name:  Not on file  . Number of children: 1  . Years of education: Not on file  . Highest education level: Not on file  Occupational History  . Occupation: Retired    Fish farm manager: RETIRED  Social Needs  . Financial resource strain: Not on file  . Food insecurity:    Worry: Not on file    Inability: Not on file  . Transportation needs:    Medical: Not on file    Non-medical: Not on file  Tobacco Use  . Smoking status: Former Smoker    Last attempt to quit: 10/08/1970    Years since quitting: 47.9  . Smokeless tobacco: Never Used  Substance and Sexual Activity  . Alcohol use: No  . Drug use: No  . Sexual activity: Not on file  Lifestyle  . Physical activity:    Days per week: Not on file    Minutes per session: Not on file  . Stress: Not on file  Relationships  . Social connections:    Talks on phone: Not on file    Gets together: Not on file    Attends religious service: Not on file    Active member of club or organization: Not on file    Attends meetings of clubs or  organizations: Not on file    Relationship status: Not on file  . Intimate partner violence:    Fear of current or ex partner: Not on file    Emotionally abused: Not on file    Physically abused: Not on file    Forced sexual activity: Not on file  Other Topics Concern  . Not on file  Social History Narrative  . Not on file    Family History  Problem Relation Age of Onset  . Arrhythmia Mother        afib  . Alzheimer's disease Mother   . Colon cancer Father   . Heart attack Father 80  . Diabetes Sister   . Arrhythmia Sister        afib    ROS- All systems are reviewed and negative except as per the HPI above  Physical Exam: Vitals:   09/03/18 1347  BP: 122/70  Pulse: 68  Weight: 122 kg  Height: 5' 3"  (1.6 m)   Wt Readings from Last 3 Encounters:  09/03/18 122 kg  08/11/18 126.1 kg  07/14/18 124.3 kg    Labs: Lab Results  Component Value Date   NA 135 09/03/2018   K 4.7 09/03/2018   CL 103 09/03/2018   CO2 25 09/03/2018   GLUCOSE 106 (H) 09/03/2018   BUN 24 (H) 09/03/2018   CREATININE 0.92 09/03/2018   CALCIUM 9.4 09/03/2018   PHOS 4.2 06/02/2017   MG 1.9 07/12/2017   Lab Results  Component Value Date   INR 1.01 06/06/2017   Lab Results  Component Value Date   CHOL 193 03/01/2017   HDL 54 03/01/2017   LDLCALC 124 (H) 03/01/2017   TRIG 76 03/01/2017     GEN- The patient is well appearing, alert and oriented x 3 today.   Head- normocephalic, atraumatic Eyes-  Sclera clear, conjunctiva pink Ears- hearing intact Oropharynx- clear Neck- supple, no JVP Lymph- no cervical lymphadenopathy Lungs- Clear to ausculation bilaterally, normal work of breathing Heart-   regular rate and rhythm, no murmurs, rubs or gallops, PMI not laterally displaced GI- soft, NT, ND, + BS Extremities- no clubbing, cyanosis, + edema MS- no significant deformity or atrophy Skin- no rash or  lesion Psych- euthymic mood, full affect Neuro- strength and sensation are  intact  EKG-SR with first degree AV block, LBBB , LAD  pr int 238 ms, qrs int 142 ms, qtc 457 ms Echo-Study Conclusions  - Left ventricle: The cavity size was normal. There was mild focal   basal hypertrophy of the septum. Systolic function was mildly   reduced. The estimated ejection fraction was in the range of 45%   to 50%. Wall motion was normal; there were no regional wall   motion abnormalities. - Aortic valve: Transvalvular velocity was within the normal range.   There was no stenosis. There was mild to moderate regurgitation. - Aorta: Ascending aortic diameter: 37.43 mm (S). - Ascending aorta: The ascending aorta was mildly dilated. - Mitral valve: Transvalvular velocity was within the normal range.   There was no evidence for stenosis. There was mild regurgitation. - Left atrium: The atrium was mildly dilated. - Right ventricle: The cavity size was normal. Wall thickness was   normal. Systolic function was normal. - Atrial septum: No defect or patent foramen ovale was identified   by color flow Doppler. - Tricuspid valve: There was mild regurgitation. - Pulmonary arteries: Systolic pressure was within the normal   range. PA peak pressure: 33 mm Hg (S).  Impressions:  - Compared with the echo 03/5783, systolic function is reduced.  Stress test 8/15- Notes recorded by Jerline Pain, MD on 05/22/2018 at 4:38 PM EDT 1. EF 48%, septal hypokinesis.  2. Fixed small, mild mid to apical anteroseptal perfusion defect.   The septal hypokinesis and the fixed septal defect are due to LBBB. There is no ischemia. Low risk study.   Candee Furbish, MD   Assessment and Plan: 1. Persistent  afib Pt is now past amiodarone loading is on 200 mg qd with a successful cardioversion and maintainting SR She is questioning if amiodarone may be contributing to back issues which have been present before amiodarone and subsequent rt leg weakness making it more difficultly for her to  ambulate PCP ordered PT but she stopped going  Continue xarelto 20 mg daily with a CHA2DS2VASc score of at least 6 I don't think amiodarone is contributing to current MS back /leg  Issues, but am willing to reduce  the dose to 100 mg daily  Cmet/tsh today  2. Elevated BP/LLE Avoid salt Stable Chronic LLE edema  F/u with Dr. Johnsie [name] 12/16 afib clinic as needed  Butch Penny C. Mattias Walmsley, [facility] [address] [place], [place] [address] [phone]

## 2018-09-03 NOTE — Patient Instructions (Signed)
Decrease Amiodarone to 100mg  a day (1/2 of your 200mg  tab)  Follow up with Dr. Eden Emms as scheduled.

## 2018-09-03 NOTE — Addendum Note (Signed)
Encounter addended by: Newman Nip, NP on: 09/03/2018 4:41 PM  Actions taken: Sign clinical note

## 2018-09-19 NOTE — Progress Notes (Signed)
Cardiology Office Note   Date:  09/22/2018   ID:  Charlotte [name], DOB [birth year], MRN [id number]  PCP:  [name], MD  Cardiologist:  Dr. Johnsie Cancel     No chief complaint on file.     History of Present Illness: Charlotte [name] is a 82 y.o. female who presents for her a fib.    PMH of PAF, nonobstructive CAD, chronic systolic heart failure with normalization of EF, HTN, HLD, and DM II.  She was hospitalized twice in August, first for GI bleeding and then for lumbar spine compression fracture. Office EKG obtained on 07/12/2017 showed atrial fibrillation, She underwent successful DC cardioversion on 07/16/2017 after 3 shocks, one at 150 J and twice at 200 J.  Seen by PA 07/23/17 and in NSR 10/28/17 was in SR,   Losartan was added for hypertension.    Saw Doristine Devoid in Afib clinic 09/03/18 concern that amiodarone causing Back pain and leg weakness dose decreased to 100 mg daily  She continues to be less mobile with back and hip pain Seen by Ortho and told No steroid injections ? Due to risk of DM   Past Medical History:  Diagnosis Date  . Aortic insufficiency   . Atrial fibrillation ([place])    a. s/p DCCV 03/2013  . CAD (coronary artery disease)    LHC 06/2010: EF 55%, mild plaque in the LAD 20-30%, otherwise normal coronary arteries  . CATARACTS   . Chronic systolic heart failure ([place])    in setting of AFib;  Echocardiogram 02/2013: Moderate LVH, EF 25-30%, anteroseptal and apical HK, moderate AI, MAC, moderate MR, moderate LAE, mild RAE, PASP 32 => after DCCV f/u Echo 8/14: Moderate LVH, EF 60-65%, normal wall motion, grade 1 diastolic dysfunction, moderate AI, moderate LAE  . Complication of anesthesia 2009   nausea and vomitting  . GERD (gastroesophageal reflux disease)   . HYPERLIPIDEMIA, MILD   . HYPERTENSION, BENIGN SYSTEMIC   . Irritable bowel syndrome   . LBBB (left bundle branch block)   . MEDIAL EPICONDYLITIS   . NSVT (nonsustained ventricular tachycardia) ([place])     during Dob Echo 2011 => normal cath  . OSTEOARTHRITIS   . OSTEOPENIA   . PONV (postoperative nausea and vomiting)   . Pre-diabetes   . RHINITIS, ALLERGIC   . SYNCOPE   . VITAMIN D DEFICIENCY     Past Surgical History:  Procedure Laterality Date  . CARDIOVERSION N/A 04/01/2013   Procedure: CARDIOVERSION;  Surgeon: Josue Hector, MD;  Location: [facility] ENDOSCOPY;  Service: Cardiovascular;  Laterality: N/A;  . CARDIOVERSION N/A 07/16/2017   Procedure: CARDIOVERSION;  Surgeon: Pixie Casino, MD;  Location: [facility] ENDOSCOPY;  Service: Cardiovascular;  Laterality: N/A;  . CARDIOVERSION N/A 07/07/2018   Procedure: CARDIOVERSION;  Surgeon: Pixie Casino, MD;  Location: [place];  Service: Cardiovascular;  Laterality: N/A;  . CATARACT EXTRACTION, BILATERAL  2012   Dr. Herbert Deaner  . COLONOSCOPY WITH PROPOFOL N/A 05/23/2017   Procedure: COLONOSCOPY WITH PROPOFOL;  Surgeon: Wonda Horner, MD;  Location[facility] ENDOSCOPY;  Service: Endoscopy;  Laterality: N/A;  . ESOPHAGOGASTRODUODENOSCOPY (EGD) WITH PROPOFOL N/A 05/23/2017   Procedure: ESOPHAGOGASTRODUODENOSCOPY (EGD) WITH PROPOFOL;  Surgeon: Wonda Horner, MD;  Location: [facility] ENDOSCOPY;  Service: Endoscopy;  Laterality: N/A;  . IR KYPHO LUMBAR INC FX REDUCE BONE BX UNI/BIL CANNULATION INC/IMAGING  06/06/2017  . IR RADIOLOGIST EVAL & MGMT  07/01/2017  . RADICAL HYSTERECTOMY    . REPLACEMENT TOTAL KNEE  11/03/07 right....... 03/29/08 left     Current Outpatient Medications  Medication Sig Dispense Refill  . acetaminophen (TYLENOL) 500 MG tablet Take 1,000 mg by mouth 3 (three) times daily.    . AMBULATORY NON FORMULARY MEDICATION Medication Name: Shingrix x 1 repeat in 2- 6 months 1 vial 0  . AMBULATORY NON FORMULARY MEDICATION Medication Name: Engineer, materials. DX: M48.046 1 each 0  . AMBULATORY NON FORMULARY MEDICATION Medication Name: Sports administrator.DX:M48.046 1 each 0  . amiodarone (PACERONE) 200 MG tablet Take 0.5 tablets (100 mg total)  by mouth daily. 90 tablet 2  . Calcium Carb-Cholecalciferol (CALCIUM 600/VITAMIN D3) 600-800 MG-UNIT TABS Take 1 tablet by mouth every evening.     . Cholecalciferol (VITAMIN D PO) Take 1 tablet by mouth every evening.     . furosemide (LASIX) 20 MG tablet Take 1 tablet (20 mg total) by mouth daily. 1 tablet 0  . gabapentin (NEURONTIN) 300 MG capsule TAKE 1 CAPSULE FOUR TIMES DAILY (Patient taking differently: Take 600 mg by mouth 2 (two) times daily. ) 360 capsule 1  . losartan (COZAAR) 25 MG tablet Take 1 tablet (25 mg total) by mouth daily. 90 tablet 3  . Multiple Vitamin (MULTIVITAMIN WITH MINERALS) TABS tablet Take 1 tablet by mouth every evening.     . Omega-3 Fatty Acids (FISH OIL PO) Take 1 capsule by mouth daily.     . polyvinyl alcohol-povidone (REFRESH) 1.4-0.6 % ophthalmic solution Place 1-2 drops into both eyes daily as needed (dry eyes).     . potassium chloride (K-DUR,KLOR-CON) 10 MEQ tablet Take 1 tablet (10 mEq total) by mouth 2 (two) times daily. 180 tablet 3  . Psyllium (METAMUCIL PO) Take 5 mLs by mouth at bedtime as needed (constipation). Mix in 8 oz liquid and drink     . rivaroxaban (XARELTO) 20 MG TABS tablet Take 1 tablet (20 mg total) by mouth daily with supper. 30 tablet 1  . vitamin B-12 (CYANOCOBALAMIN) 1000 MCG tablet Take 1,000 mcg by mouth every evening.     . carvedilol (COREG) 12.5 MG tablet Take 1 tablet (12.5 mg total) by mouth 2 (two) times daily. 180 tablet 3   No current facility-administered medications for this visit.     Allergies:   Amlodipine; Metformin and related; and Amlodipine besylate-valsartan    Social History:  The patient  reports that she quit smoking about 47 years ago. She has never used smokeless tobacco. She reports that she does not drink alcohol or use drugs.   Family History:  The patient's family history includes Alzheimer's disease in her mother; Arrhythmia in her mother and sister; Colon cancer in her father; Diabetes in her  sister; Heart attack (age of onset: 24) in her father.    ROS:  General:no colds or fevers, + weight gain Skin:no rashes or ulcers HEENT:no blurred vision, no congestion CV:see HPI PUL:see HPI GI:no diarrhea constipation or melena, no indigestion GU:no hematuria, no dysuria MS:no joint pain, no claudication Neuro:no syncope, no lightheadedness Endo:no diabetes, no thyroid disease  Wt Readings from Last 3 Encounters:  09/22/18 285 lb 6.4 oz (129.5 kg)  09/03/18 269 lb (122 kg)  08/11/18 278 lb (126.1 kg)     PHYSICAL EXAM: VS:  BP 110/70   Pulse 74   Ht 5' 3"  (1.6 m)   Wt 285 lb 6.4 oz (129.5 kg)   SpO2 97%   BMI 50.56 kg/m  , BMI  Affect appropriate Overweight white female  HEENT: normal Neck  supple with no adenopathy JVP normal no bruits no thyromegaly Lungs clear with no wheezing and good diaphragmatic motion Heart:  S1/S2 no murmur, no rub, gallop or click PMI normal Abdomen: benighn, BS positve, no tenderness, no AAA no bruit.  No HSM or HJR Distal pulses intact with no bruits Plus one bilateral edema with varicosities  Neuro non-focal Skin warm and dry No muscular weakness     EKG:  EKG is ordered today. The ekg ordered today demonstrates A fib rate controlled at 81 with LAD, LBBB and PVCs occ.     Recent Labs: 09/03/2018: ALT 30; BUN 24; Creatinine, Ser 0.92; Hemoglobin 13.4; Platelets 208; Potassium 4.7; Sodium 135; TSH 3.392    Lipid Panel    Component Value Date/Time   CHOL 193 03/01/2017 1148   TRIG 76 03/01/2017 1148   TRIG 112 06/26/2006   HDL 54 03/01/2017 1148   CHOLHDL 3.6 03/01/2017 1148   VLDL 15 03/01/2017 1148   LDLCALC 124 (H) 03/01/2017 1148       Other studies Reviewed: Additional studies/ records that were reviewed today include:  ECHO.02/27/16 Study Conclusions  - Left ventricle: The cavity size was normal. There was mild focal   basal and mild concentric hypertrophy of the septum. Systolic   function was normal. The  estimated ejection fraction was in the   range of 55% to 60%. Wall motion was normal; there were no   regional wall motion abnormalities. There was an increased   relative contribution of atrial contraction to ventricular   filling. Doppler parameters are consistent with abnormal left   ventricular relaxation (grade 1 diastolic dysfunction). - Ventricular septum: Septal motion showed moderate paradox. These   changes are consistent with intraventricular conduction delay. - Aortic valve: Trileaflet; normal thickness, mildly calcified   leaflets. There was mild regurgitation. - Mitral valve: Calcified annulus. Mild focal calcification of the   anterior leaflet (medial segment(s)). - Atrial septum: There was increased thickness of the septum,   consistent with lipomatous hypertrophy. - Tricuspid valve: There was trivial regurgitation.   ASSESSMENT AND PLAN:  1. PAF:  [facility] 07/16/17 with recurrence loaded with amiodarone and repeat [facility] 07/07/18 Continue low dose amiodarone and xarelto  2.  HTN Well controlled.  Continue current medications and low sodium Dash type diet.     3.  CAD non obstructive in in 2011.  Not on asa due to xarelto.   4.  HLD continue statin labs with primary   5.   DM-2 Discussed low carb diet.  Target hemoglobin A1c is 6.5 or less.  Continue current medications.  6. Edema:  From varicose veins, and obesity continue diuretic  7. Ortho:  Biggest issue is back and left hip pain Needs more PT/OT , weight loss and not Clear to me how aggressive ortho has been in w/u  Has significant disc protrusion and foraminal narrowing in lumbar spine on MRI 04/19/18     F/U in a year  [employer]

## 2018-09-22 ENCOUNTER — Encounter: Payer: Self-pay | Admitting: Cardiovascular Disease

## 2018-09-22 ENCOUNTER — Ambulatory Visit: Payer: Medicare HMO | Admitting: Cardiovascular Disease

## 2018-09-22 VITALS — BP 110/70 | HR 74 | Ht 63.0 in | Wt 285.4 lb

## 2018-09-22 DIAGNOSIS — I2510 Atherosclerotic heart disease of native coronary artery without angina pectoris: Secondary | ICD-10-CM

## 2018-09-22 DIAGNOSIS — I10 Essential (primary) hypertension: Secondary | ICD-10-CM

## 2018-09-22 DIAGNOSIS — E785 Hyperlipidemia, unspecified: Secondary | ICD-10-CM | POA: Diagnosis not present

## 2018-09-22 DIAGNOSIS — I4819 Other persistent atrial fibrillation: Secondary | ICD-10-CM | POA: Diagnosis not present

## 2018-09-22 NOTE — Patient Instructions (Addendum)
Medication Instructions:   If you need a refill on your cardiac medications before your next appointment, please call your pharmacy.   Lab work:  If you have labs (blood work) drawn today and your tests are completely normal, you will receive your results only by: . MyChart Message (if you have MyChart) OR . A paper copy in the mail If you have any lab test that is abnormal or we need to change your treatment, we will call you to review the results.  Testing/Procedures: NONE ordered today.  Follow-Up: At [facility], you and your health needs are our priority.  As part of our continuing mission to provide you with exceptional heart care, we have created designated Provider Care Teams.  These Care Teams include your primary Cardiologist (physician) and Advanced Practice Providers (APPs -  Physician Assistants and Nurse Practitioners) who all work together to provide you with the care you need, when you need it. You will need a follow up appointment in 12 months.  Please call our office 2 months in advance to schedule this appointment.  You may see Dr. Nishan or one of the following Advanced Practice Providers on your designated Care Team:   Lori Gerhardt, NP Laura Ingold, NP . Jill McDaniel, NP  

## 2018-09-24 ENCOUNTER — Other Ambulatory Visit: Payer: Self-pay | Admitting: Cardiovascular Disease

## 2018-09-24 DIAGNOSIS — I480 Paroxysmal atrial fibrillation: Secondary | ICD-10-CM

## 2018-10-06 ENCOUNTER — Encounter: Payer: Self-pay | Admitting: Family Medicine

## 2018-10-06 ENCOUNTER — Ambulatory Visit (INDEPENDENT_AMBULATORY_CARE_PROVIDER_SITE_OTHER): Payer: Medicare HMO | Admitting: Family Medicine

## 2018-10-06 VITALS — BP 181/80 | HR 67 | Wt 285.0 lb

## 2018-10-06 DIAGNOSIS — M7062 Trochanteric bursitis, left hip: Secondary | ICD-10-CM | POA: Diagnosis not present

## 2018-10-06 DIAGNOSIS — G8929 Other chronic pain: Secondary | ICD-10-CM

## 2018-10-06 DIAGNOSIS — M25562 Pain in left knee: Secondary | ICD-10-CM | POA: Diagnosis not present

## 2018-10-06 DIAGNOSIS — M48061 Spinal stenosis, lumbar region without neurogenic claudication: Secondary | ICD-10-CM

## 2018-10-06 DIAGNOSIS — M47819 Spondylosis without myelopathy or radiculopathy, site unspecified: Secondary | ICD-10-CM

## 2018-10-06 NOTE — Progress Notes (Signed)
Charlotte [name] is a 82 y.o. female who presents to [facility] today for left knee pain, left low back pain, left lateral hip pain.  [name] has a history of back and knee pain.  She is status post bilateral total knee replacement but notes that she is always had pain in her knee even following the total knee replacement.  She notes the pain is worse recently.  She does pain is of the anterior and posterior aspect of the knee.  Pain does not seem to change much with motion of the knee.    Additionally she notes pain in her left low back.  She is had some work-up by her primary care provider already as well as by orthopedics.  She is had trials of physical therapy at orthopedics which did not help much.  She denies any recent injury.  This pain is been ongoing now for a few months.  No new bowel or bladder dysfunction or new weakness.  Additionally she notes pain in the left lateral hip.  This pain occurs with standing from a seated position and laying on her left side.  Pain is moderate.  This is always been ongoing now for about 2 months.  She notes pain worsened after physical therapy for her back.  She is not had dedicated physical therapy for her lateral hip.  She is tried Tylenol for pain control which helped a bit.  She takes gabapentin as well which is helped only a little as well.    ROS:  As above  Exam:  BP (!) 181/80   Pulse 67   Wt 285 lb (129.3 kg)   BMI 50.49 kg/m  General: Well Developed, well nourished, and in no acute distress.  Neuro/Psych: Alert and oriented x3, extra-ocular muscles intact, able to move all 4 extremities, sensation grossly intact. Skin: Warm and dry, no rashes noted.  Respiratory: Not using accessory muscles, speaking in full sentences, trachea midline.  Cardiovascular: Pulses palpable, no extremity edema. Abdomen: Does not appear distended. MSK:  L-spine: Nontender to midline.  Tender palpation left lumbar  paraspinal musculature.  Decreased lumbar motion. Left hip normal-appearing normal motion.  Tender to palpation greater trochanter.  Pain worsens with shifting weight to left foot. Left knee normal-appearing normal motion without crepitation.  Not particularly tender to palpation.    Lab and Radiology Results EXAM: MRI LUMBAR SPINE WITHOUT CONTRAST  TECHNIQUE: Multiplanar, multisequence MR imaging of the lumbar spine was performed. No intravenous contrast was administered.  COMPARISON:  Radiography 04/09/2018.  MRI 06/03/2017.  FINDINGS: Segmentation:  5 lumbar type vertebral bodies.  Alignment: Curvature convex to the left with the apex at L3. 2 mm degenerative anterolisthesis L4-5.  Vertebrae: Previously augmented compression fracture at L3 with loss of height of 30%. This has healed without residual edema or further loss of height. There is discogenic marrow change on the left at L1-2 with mild edema which could contribute to back pain. No new fracture.  Conus medullaris and cauda equina: Conus extends to the L1-2 level. Conus and cauda equina appear normal.  Paraspinal and other soft tissues: Negative  Disc levels:  No significant finding at T11-12 or T12-L1.  L1-2: Disc degeneration more pronounced on the left. Endplate osteophytes and bulging of the disc. Mild facet hypertrophy. Mild narrowing of the lateral recesses without visible neural compression. Discogenic marrow changes on the left could be associated with back pain, as noted above.  L2-3: Disc degeneration with circumferential  protrusion, most prominent in the right foraminal region. Facet and ligamentous hypertrophy. Stenosis of both lateral recesses. Foraminal encroachment on the right by disc material could focally compress the right L2 nerve. This has worsened since the previous study.  L3-4: Endplate osteophytes and bulging of the disc. Mild facet and ligamentous hypertrophy. Mild  bilateral lateral recess stenosis. Foraminal narrowing worse on the right than the left. Some potential this could affect the right L3 nerve. Similar appearance to the previous exam.  L4-5: Bilateral facet arthropathy with gaping, fluid-filled joints. 2 mm of anterolisthesis at this level that could worsen with standing or flexion. Bulging of the disc. Stenosis of both lateral recesses and of the foramina left more than right. Neural compression could occur on either side at this level, and would be more likely with standing or flexion.  L5-S1: No disc abnormality. Facet osteoarthritis worse on the left. No compressive stenosis. The facet arthropathy could be symptomatic.  IMPRESSION: Previously augmented compression fracture at L3 has healed without residual edema or further collapse.  L1-2: Disc degeneration more pronounced on the left with endplate osteophytes and bulging of the disc. Mild narrowing of the lateral recesses but no definite neural compression. Discogenic marrow changes worse on the left which could contribute to back pain.  L2-3 disc protrusion, most pronounced in the foraminal regions right more than left. I think this has worsened since the previous study. There is potential for neural compression in both lateral recesses and foramina, particularly the right foramen where the right L2 nerve appears to be compressed.  L3-4 lateral recess and foraminal narrowing. Foraminal narrowing on the right could affect the L3 nerve.  L4-5 facet arthropathy with 2 mm of anterolisthesis. Narrowing of the lateral recesses and foramina that could cause neural compression. This appearance could worsen with standing or flexion based on the morphology of the facet arthropathy.  L5-S1 facet osteoarthritis on the left which could be painful.   Electronically Signed   By: Nelson Chimes M.D.   On: 04/19/2018 17:57  EXAM: RIGHT KNEE - COMPLETE 4+ VIEW; LEFT KNEE -  COMPLETE 4+ VIEW  COMPARISON:  None.  FINDINGS: The patient is status post bilateral knee replacement. Hardware is intact without loosening or other complicating feature. No acute bony or joint abnormality is seen. No joint effusion. Atherosclerosis noted.  IMPRESSION: No acute abnormality.  Status post bilateral knee replacement.  Atherosclerosis.   Electronically Signed   By: Inge Rise M.D.   On: 04/09/2018 15:10 I personally (independently) visualized and performed the interpretation of the images attached in this note.   Assessment and Plan: 82 y.o. female with  Left low back pain: Patient certainly has multiple reasons to hurt however she has a degenerative left L5-S1 facet joint which I think is the most likely pain generator for her.  She is had trials of physical therapy with little benefit.  Plan for trial of facet injection and recheck in a few weeks.  Left knee pain: X-ray of knee in July 2018 showed intact appearing hardware.  Again her pain could certainly could be multifactorial.  She does have a history of neuropathy which quickly is however pain.  Plan to start first to evaluate hardware loosening with bone scan.  If that is normal neck step would likely be EMG/nerve conduction study.  Left lateral hip pain: Likely trochanteric bursitis.  This likely will benefit from physical therapy.  Plan to refer to physical therapy if not improving next that would be steroid  injection.    Orders Placed This Encounter  Procedures  . DG FACET JT INJ L /S SINGLE LEVEL LEFT W/FL/CT    Order Specific Question:   Reason for exam:    Answer:   L5S1 left facet    Order Specific Question:   Preferred imaging location?    Answer:   [facility]  . NM Bone Scan 3 Phase Lower Extremity    Standing Status:   Future    Standing Expiration Date:   12/06/2019    Order Specific Question:   If indicated for the ordered procedure, I authorize the administration of a  radiopharmaceutical per Radiology protocol    Answer:   Yes    Order Specific Question:   Preferred imaging location?    Answer:   [facility]    Order Specific Question:   Radiology Contrast Protocol - do NOT remove file path    Answer:   [email or web address]  . Ambulatory referral to Physical Therapy    Referral Priority:   Routine    Referral Type:   Physical Medicine    Referral Reason:   Specialty Services Required    Requested Specialty:   Physical Therapy   No orders of the defined types were placed in this encounter.   Historical information moved to improve visibility of documentation.  Past Medical History:  Diagnosis Date  . Aortic insufficiency   . Atrial fibrillation ([place])    a. s/p DCCV 03/2013  . CAD (coronary artery disease)    LHC 06/2010: EF 55%, mild plaque in the LAD 20-30%, otherwise normal coronary arteries  . CATARACTS   . Chronic systolic heart failure ([place])    in setting of AFib;  Echocardiogram 02/2013: Moderate LVH, EF 25-30%, anteroseptal and apical HK, moderate AI, MAC, moderate MR, moderate LAE, mild RAE, PASP 32 => after DCCV f/u Echo 8/14: Moderate LVH, EF 60-65%, normal wall motion, grade 1 diastolic dysfunction, moderate AI, moderate LAE  . Complication of anesthesia 2009   nausea and vomitting  . GERD (gastroesophageal reflux disease)   . HYPERLIPIDEMIA, MILD   . HYPERTENSION, BENIGN SYSTEMIC   . Irritable bowel syndrome   . LBBB (left bundle branch block)   . MEDIAL EPICONDYLITIS   . NSVT (nonsustained ventricular tachycardia) ([place])    during Dob Echo 2011 => normal cath  . OSTEOARTHRITIS   . OSTEOPENIA   . PONV (postoperative nausea and vomiting)   . Pre-diabetes   . RHINITIS, ALLERGIC   . SYNCOPE   . VITAMIN D DEFICIENCY    Past Surgical History:  Procedure Laterality Date  . CARDIOVERSION N/A 04/01/2013   Procedure: CARDIOVERSION;  Surgeon: Josue Hector, MD;  Location: [facility] ENDOSCOPY;  Service:  Cardiovascular;  Laterality: N/A;  . CARDIOVERSION N/A 07/16/2017   Procedure: CARDIOVERSION;  Surgeon: Pixie Casino, MD;  Location: [facility] ENDOSCOPY;  Service: Cardiovascular;  Laterality: N/A;  . CARDIOVERSION N/A 07/07/2018   Procedure: CARDIOVERSION;  Surgeon: Pixie Casino, MD;  Location: [place];  Service: Cardiovascular;  Laterality: N/A;  . CATARACT EXTRACTION, BILATERAL  2012   Dr. Herbert Deaner  . COLONOSCOPY WITH PROPOFOL N/A 05/23/2017   Procedure: COLONOSCOPY WITH PROPOFOL;  Surgeon: Wonda Horner, MD;  Location: [facility] ENDOSCOPY;  Service: Endoscopy;  Laterality: N/A;  . ESOPHAGOGASTRODUODENOSCOPY (EGD) WITH PROPOFOL N/A 05/23/2017   Procedure: ESOPHAGOGASTRODUODENOSCOPY (EGD) WITH PROPOFOL;  Surgeon: Wonda Horner, MD;  Location: [facility] ENDOSCOPY;  Service: Endoscopy;  Laterality: N/A;  . IR [place]  FX REDUCE BONE BX UNI/BIL CANNULATION INC/IMAGING  06/06/2017  . IR RADIOLOGIST EVAL & MGMT  07/01/2017  . RADICAL HYSTERECTOMY    . REPLACEMENT TOTAL KNEE     11/03/07 right....... 03/29/08 left   Social History   Tobacco Use  . Smoking status: Former Smoker    Last attempt to quit: 10/08/1970    Years since quitting: 48.0  . Smokeless tobacco: Never Used  Substance Use Topics  . Alcohol use: No   family history includes Alzheimer's disease in her mother; Arrhythmia in her mother and sister; Colon cancer in her father; Diabetes in her sister; Heart attack (age of onset: 57) in her father.  Medications: Current Outpatient Medications  Medication Sig Dispense Refill  . acetaminophen (TYLENOL) 500 MG tablet Take 1,000 mg by mouth 3 (three) times daily.    . AMBULATORY NON FORMULARY MEDICATION Medication Name: Shingrix x 1 repeat in 2- 6 months 1 vial 0  . AMBULATORY NON FORMULARY MEDICATION Medication Name: Engineer, materials. DX: M48.046 1 each 0  . AMBULATORY NON FORMULARY MEDICATION Medication Name: Sports administrator.DX:M48.046 1 each 0  . amiodarone (PACERONE) 200 MG  tablet Take 0.5 tablets (100 mg total) by mouth daily. 90 tablet 2  . Calcium Carb-Cholecalciferol (CALCIUM 600/VITAMIN D3) 600-800 MG-UNIT TABS Take 1 tablet by mouth every evening.     . Cholecalciferol (VITAMIN D PO) Take 1 tablet by mouth every evening.     . furosemide (LASIX) 20 MG tablet Take 1 tablet (20 mg total) by mouth daily. 1 tablet 0  . gabapentin (NEURONTIN) 300 MG capsule TAKE 1 CAPSULE FOUR TIMES DAILY (Patient taking differently: Take 600 mg by mouth 2 (two) times daily. ) 360 capsule 1  . losartan (COZAAR) 25 MG tablet TAKE 1 TABLET EVERY DAY 90 tablet 3  . Multiple Vitamin (MULTIVITAMIN WITH MINERALS) TABS tablet Take 1 tablet by mouth every evening.     . Omega-3 Fatty Acids (FISH OIL PO) Take 1 capsule by mouth daily.     . polyvinyl alcohol-povidone (REFRESH) 1.4-0.6 % ophthalmic solution Place 1-2 drops into both eyes daily as needed (dry eyes).     . potassium chloride (K-DUR,KLOR-CON) 10 MEQ tablet Take 1 tablet (10 mEq total) by mouth 2 (two) times daily. 180 tablet 3  . Psyllium (METAMUCIL PO) Take 5 mLs by mouth at bedtime as needed (constipation). Mix in 8 oz liquid and drink     . rivaroxaban (XARELTO) 20 MG TABS tablet Take 1 tablet (20 mg total) by mouth daily with supper. 30 tablet 1  . vitamin B-12 (CYANOCOBALAMIN) 1000 MCG tablet Take 1,000 mcg by mouth every evening.     . carvedilol (COREG) 12.5 MG tablet Take 1 tablet (12.5 mg total) by mouth 2 (two) times daily. 180 tablet 3   No current facility-administered medications for this visit.    Allergies  Allergen Reactions  . Amlodipine     Other reaction(s): HEADACHE  . Metformin And Related Other (See Comments)    Renal failure  . Amlodipine Besylate-Valsartan Other (See Comments)    headache      Discussed warning signs or symptoms. Please see discharge instructions. Patient expresses understanding.

## 2018-10-06 NOTE — Patient Instructions (Signed)
Thank you for coming in today. You should hear about the facet injection in the low back.  You should also hear about the bone scan for the knee.  Please attend physical therapy for left lateral hip pain.     Facet Joint Block The facet joints connect the bones of the spine (vertebrae). They make it possible for you to bend, twist, and make other movements with your spine. They also keep you from bending too far, twisting too far, and making other excessive movements. A facet joint block is a procedure where a numbing medicine (anesthetic) is injected into a facet joint. Often, a type of anti-inflammatory medicine called a steroid is also injected. A facet joint block may be done to diagnose neck or back pain. If the pain gets better after a facet joint block, it means the pain is probably coming from the facet joint. If the pain does not get better, it means the pain is probably not coming from the facet joint. A facet joint block may also be done to relieve neck or back pain caused by an inflamed facet joint. A facet joint block is only done to relieve pain if the pain does not improve with other methods, such as medicine, exercise programs, and physical therapy. Tell a health care provider about:  Any allergies you have.  All medicines you are taking, including vitamins, herbs, eye drops, creams, and over-the-counter medicines.  Any problems you or family members have had with anesthetic medicines.  Any blood disorders you have.  Any surgeries you have had.  Any medical conditions you have.  Whether you are pregnant or may be pregnant. What are the risks? Generally, this is a safe procedure. However, problems may occur, including:  Bleeding.  Injury to a nerve near the injection site.  Pain at the injection site.  Weakness or numbness in areas controlled by nerves near the injection site.  Infection.  Temporary fluid retention.  Allergic reactions to medicines or  dyes.  Injury to other structures or organs near the injection site. What happens before the procedure?  Follow instructions from your health care provider about eating or drinking restrictions.  Ask your health care provider about: ? Changing or stopping your regular medicines. This is especially important if you are taking diabetes medicines or blood thinners. ? Taking medicines such as aspirin and ibuprofen. These medicines can thin your blood. Do not take these medicines before your procedure if your health care provider instructs you not to.  Do not take any new dietary supplements or medicines without asking your health care provider first.  Plan to have someone take you home after the procedure. What happens during the procedure?   You may need to remove your clothing and dress in an open-back gown.  The procedure will be done while you are lying on an X-ray table. You will most likely be asked to lie on your stomach, but you may be asked to lie in a different position if an injection will be made in your neck.  Machines will be used to monitor your oxygen levels, heart rate, and blood pressure.  If an injection will be made in your neck, an IV tube will be inserted into one of your veins. Fluids and medicine will flow directly into your body through the IV tube.  The area over the facet joint where the injection will be made will be cleaned with soap. The surrounding skin will be covered with clean drapes.  A  numbing medicine (local anesthetic) will be applied to your skin. Your skin may sting or burn for a moment.  A video X-ray machine (fluoroscopy) will be used to locate the joint. In some cases, a CT scan may be used.  A contrast dye may be injected into the facet joint area to help locate the joint.  When the joint is located, an anesthetic will be injected into the joint through the needle.  Your health care provider will ask you whether you feel pain relief. If you  do feel relief, a steroid may be injected to provide pain relief for a longer period of time. If you do not feel relief or feel only partial relief, additional injections of an anesthetic may be made in other facet joints.  The needle will be removed.  Your skin will be cleaned.  A bandage (dressing) will be applied over each injection site. The procedure may vary among health care providers and hospitals. What happens after the procedure?  You will be observed for 15-30 minutes before being allowed to go home. This information is not intended to replace advice given to you by your health care provider. Make sure you discuss any questions you have with your health care provider. Document Released: 02/13/2007 Document Revised: 09/26/2017 Document Reviewed: 06/20/2015 Elsevier Interactive Patient Education  2019 [employer]   Trochanteric Bursitis Trochanteric bursitis is a condition that causes hip pain. Trochanteric bursitis happens when fluid-filled sacs (bursae) in the hip get irritated. Normally these sacs absorb shock and help strong bands of tissue (tendons) in your hip glide smoothly over each other and over your hip bones. What are the causes? This condition results from increased friction between the hip bones and the tendons that go over them. This condition can happen if you:  Have weak hips.  Use your hip muscles too much (overuse).  Get hit in the hip. What increases the risk? This condition is more likely to develop in:  Women.  Adults who are middle-aged or older.  People with arthritis or a spinal condition.  People with weak buttocks muscles (gluteal muscles).  People who have one leg that is shorter than the other.  People who participate in certain kinds of athletic activities, such as: ? Running sports, especially long-distance running. ? Contact sports, like football or martial arts. ? Sports in which falls may occur, like skiing. What are the signs  or symptoms? The main symptom of this condition is pain and tenderness over the point of your hip. The pain may be:  Sharp and intense.  Dull and achy.  Felt on the outside of your thigh. It may increase when you:  Lie on your side.  Walk or run.  Go up on stairs.  Sit.  Stand up after sitting.  Stand for long periods of time. How is this diagnosed? This condition may be diagnosed based on:  Your symptoms.  Your medical history.  A physical exam.  Imaging tests, such as: ? X-rays to check your bones. ? An MRI or ultrasound to check your tendons and muscles. During your physical exam, your health care provider will check the movement and strength of your hip. He or she may press on the point of your hip to check for pain. How is this treated? This condition may be treated by:  Resting.  Reducing your activity.  Avoiding activities that cause pain.  Using crutches, a cane, or a walker to decrease the strain on your hip.  Taking  medicine to help with swelling.  Having medicine injected into the bursae to help with swelling.  Using ice, heat, and massage therapy for pain relief.  Physical therapy exercises for strength and flexibility.  Surgery (rare). Follow these instructions at home: Activity  Rest.  Avoid activities that cause pain.  Return to your normal activities as told by your health care provider. Ask your health care provider what activities are safe for you. Managing pain, stiffness, and swelling  Take over-the-counter and prescription medicines only as told by your health care provider.  If directed, apply heat to the injured area as told by your health care provider. ? Place a towel between your skin and the heat source. ? Leave the heat on for 20-30 minutes. ? Remove the heat if your skin turns bright red. This is especially important if you are unable to feel pain, heat, or cold. You may have a greater risk of getting burned.  If  directed, apply ice to the injured area: ? Put ice in a plastic bag. ? Place a towel between your skin and the bag. ? Leave the ice on for 20 minutes, 2-3 times a day. General instructions  If the affected leg is one that you use for driving, ask your health care provider when it is safe to drive.  Use crutches, a cane, or a walker as told by your health care provider.  If one of your legs is shorter than the other, get fitted for a shoe insert.  Lose weight if you are overweight. How is this prevented?  [place] supportive footwear that is appropriate for your sport.  If you have hip pain, start any new exercise or sport slowly.  Maintain physical fitness, including: ? Strength. ? Flexibility. Contact a health care provider if:  Your pain does not improve with 2-4 weeks. Get help right away if:  You develop severe pain.  You have a fever.  You develop increased redness over your hip.  You have a change in your bowel function or bladder function.  You cannot control the muscles in your feet. This information is not intended to replace advice given to you by your health care provider. Make sure you discuss any questions you have with your health care provider. Document Released: 11/01/2004 Document Revised: 05/30/2016 Document Reviewed: 09/09/2015 Elsevier Interactive Patient Education  2019 [employer].

## 2018-10-07 ENCOUNTER — Telehealth: Payer: Self-pay | Admitting: *Deleted

## 2018-10-07 NOTE — Telephone Encounter (Signed)
Pharmacy please comment on Xarelto and then we'll contact the patient.  Corine Shelter PA-C 10/07/2018 4:13 PM

## 2018-10-07 NOTE — Telephone Encounter (Signed)
   [facility] Pre-operative Risk Assessment    Request for surgical clearance:  1. What type of surgery is being performed? LUMBAR FACET INJECTION   2. When is this surgery scheduled? TBD   3. What type of clearance is required (medical clearance vs. Pharmacy clearance to hold med vs. Both)? BOTH  4. Are there any medications that need to be held prior to surgery and how long? XARELTO X 1 DAY PRIOR    5. Practice name and name of physician performing surgery? [facility]   6. What is your office phone number [phone]    7.   What is your office fax number [phone]  8.   Anesthesia type (None, local, MAC, general) ? LIDOCAINE   Julaine Hua 10/07/2018, 3:09 PM  _________________________________________________________________   (provider comments below)

## 2018-10-09 NOTE — Telephone Encounter (Signed)
Patient with diagnosis of Afib (last cardioverted in Sept 2019) on Xarelto for anticoagulation.    Procedure: lumbar facet injection Date of procedure: TBD  CHADS2-VASc score of  7 (CHF, HTN, AGE, DM2, stroke/tia x 2, CAD, AGE, female)  CrCl 172ml/min  With spinal injections generally recommend hold Xarelto for 3 days prior to procedure, pt is high risk with a CHADSVASC of 7. Will route to Dr. Eden Emms for input on length of hold. Of note it appears requesting physician is only requesting one day prior to procedure.

## 2018-10-13 ENCOUNTER — Encounter (HOSPITAL_COMMUNITY)
Admission: RE | Admit: 2018-10-13 | Discharge: 2018-10-13 | Disposition: A | Discharge status: Home / Self Care | Payer: Medicare HMO | Source: Ambulatory Visit | Attending: Family Medicine | Admitting: Family Medicine

## 2018-10-13 ENCOUNTER — Encounter: Payer: Self-pay | Admitting: Physician Assistant

## 2018-10-13 DIAGNOSIS — G8929 Other chronic pain: Secondary | ICD-10-CM | POA: Diagnosis present

## 2018-10-13 DIAGNOSIS — M25562 Pain in left knee: Secondary | ICD-10-CM | POA: Diagnosis not present

## 2018-10-13 MED ORDER — TECHNETIUM TC 99M MEDRONATE IV KIT
21.1000 | PACK | Freq: Once | INTRAVENOUS | Status: AC | PRN
  Administered 2018-10-13: 21.1 via INTRAVENOUS

## 2018-10-13 NOTE — Telephone Encounter (Signed)
83 yo female with persistent AF s/p multiple DCCVs in the past (most recent 06/2018), amiodarone Rx, anticoagulation with Rivaroxaban, no significant CAD on LHC in 2011, systolic CHF secondary to tachycardia induced CM.  EF has improved to normal in NSR.  Last echo (while in AFib) 8/19:  EF 45-50.  Myoview 8/19: low risk, no ischemia.  Last seen by Dr. Eden Emms 09/22/18.    ESI is a low risk procedure.  She should not need specific medical clearance.  But, the requesting surgeon is asking for anticoagulation and medical clearance.  I have left a message for the patient to call back to ensure there has not been a clinical change since her last office visit.    Tereso Newcomer, PA-C    10/13/2018 3:07 PM

## 2018-10-13 NOTE — Telephone Encounter (Signed)
Ok to hold for 3 days before injection

## 2018-10-13 NOTE — Telephone Encounter (Signed)
New Message ° ° ° ° ° ° ° ° ° °Patient returned your call, would like a call back. °

## 2018-10-14 ENCOUNTER — Other Ambulatory Visit: Payer: Self-pay

## 2018-10-14 ENCOUNTER — Encounter: Payer: Self-pay | Admitting: Rehabilitative and Restorative Service Providers"

## 2018-10-14 ENCOUNTER — Ambulatory Visit (INDEPENDENT_AMBULATORY_CARE_PROVIDER_SITE_OTHER): Payer: Medicare HMO | Admitting: Rehabilitative and Restorative Service Providers"

## 2018-10-14 DIAGNOSIS — M25552 Pain in left hip: Secondary | ICD-10-CM | POA: Diagnosis not present

## 2018-10-14 DIAGNOSIS — M6281 Muscle weakness (generalized): Secondary | ICD-10-CM

## 2018-10-14 DIAGNOSIS — R29898 Other symptoms and signs involving the musculoskeletal system: Secondary | ICD-10-CM | POA: Diagnosis not present

## 2018-10-14 DIAGNOSIS — M545 Low back pain, unspecified: Secondary | ICD-10-CM

## 2018-10-14 DIAGNOSIS — M25562 Pain in left knee: Secondary | ICD-10-CM

## 2018-10-14 DIAGNOSIS — G8929 Other chronic pain: Secondary | ICD-10-CM | POA: Diagnosis not present

## 2018-10-14 NOTE — Telephone Encounter (Signed)
   Primary Cardiologist: Charlton Haws, MD  Chart reviewed as part of pre-operative protocol coverage.   Patient contacted by phone today.  She is doing fine since she was last seen.  She denies any new symptoms of chest pain or worsening shortness of breath.  PLAN:  1. Ok to proceed with ESI at acceptable CV risk. 2. Ok to hold Xarelto for 1-3 days as noted.  Xarelto should be resumed post procedure as soon as it is safe. Please call with questions.   Call back staff: This phone note was sent to the requesting provider. Please call to make sure it was received. This note will be removed from the preop pool.  Tereso Newcomer, PA-C 10/14/2018, 3:16 PM

## 2018-10-14 NOTE — Telephone Encounter (Signed)
This encounter was created in error - please disregard.

## 2018-10-14 NOTE — Therapy (Addendum)
[facility] [address] [place], [place], [address] Phone: [phone]   Fax:  [phone]  Physical Therapy Evaluation  Patient Details  Name: Charlotte [name] MRN: [id number] Date of Birth: 1935-10-20 Referring Provider (PT): Dr Clementeen Graham    Encounter Date: 10/14/2018  PT End of Session - 10/14/18 1202    Visit Number  1    Number of Visits  12    Date for PT Re-Evaluation  11/25/18    PT Start Time  1200    PT Stop Time  1254    PT Time Calculation (min)  54 min    Equipment Utilized During Treatment  Gait belt       Past Medical History:  Diagnosis Date  . Aortic insufficiency   . Atrial fibrillation (HCC)    a. s/p DCCV 03/2013  . CAD (coronary artery disease)    LHC 06/2010: EF 55%, mild plaque in the LAD 20-30%, otherwise normal coronary arteries  . CATARACTS   . Chronic systolic heart failure (HCC)    in setting of AFib;  Echocardiogram 02/2013: Moderate LVH, EF 25-30%, anteroseptal and apical HK, moderate AI, MAC, moderate MR, moderate LAE, mild RAE, PASP 32 => after DCCV f/u Echo 8/14: Moderate LVH, EF 60-65%, normal wall motion, grade 1 diastolic dysfunction, moderate AI, moderate LAE  . Complication of anesthesia 2009   nausea and vomitting  . GERD (gastroesophageal reflux disease)   . HYPERLIPIDEMIA, MILD   . HYPERTENSION, BENIGN SYSTEMIC   . Irritable bowel syndrome   . LBBB (left bundle branch block)   . MEDIAL EPICONDYLITIS   . NSVT (nonsustained ventricular tachycardia) (HCC)    during Dob Echo 2011 => normal cath  . OSTEOARTHRITIS   . OSTEOPENIA   . PONV (postoperative nausea and vomiting)   . Pre-diabetes   . RHINITIS, ALLERGIC   . SYNCOPE   . VITAMIN D DEFICIENCY     Past Surgical History:  Procedure Laterality Date  . CARDIOVERSION N/A 04/01/2013   Procedure: CARDIOVERSION;  Surgeon: Wendall Stade, MD;  Location: [facility] ENDOSCOPY;  Service: Cardiovascular;  Laterality: N/A;  . CARDIOVERSION  N/A 07/16/2017   Procedure: CARDIOVERSION;  Surgeon: Chrystie Nose, MD;  Location: [facility] ENDOSCOPY;  Service: Cardiovascular;  Laterality: N/A;  . CARDIOVERSION N/A 07/07/2018   Procedure: CARDIOVERSION;  Surgeon: Chrystie Nose, MD;  Location: [facility] ENDOSCOPY;  Service: Cardiovascular;  Laterality: N/A;  . CATARACT EXTRACTION, BILATERAL  2012   Dr. Elmer Picker  . COLONOSCOPY WITH PROPOFOL N/A 05/23/2017   Procedure: COLONOSCOPY WITH PROPOFOL;  Surgeon: Graylin Shiver, MD;  Location: [facility] ENDOSCOPY;  Service: Endoscopy;  Laterality: N/A;  . ESOPHAGOGASTRODUODENOSCOPY (EGD) WITH PROPOFOL N/A 05/23/2017   Procedure: ESOPHAGOGASTRODUODENOSCOPY (EGD) WITH PROPOFOL;  Surgeon: Graylin Shiver, MD;  Location: [facility] ENDOSCOPY;  Service: Endoscopy;  Laterality: N/A;  . IR KYPHO LUMBAR INC FX REDUCE BONE BX UNI/BIL CANNULATION INC/IMAGING  06/06/2017  . IR RADIOLOGIST EVAL & MGMT  07/01/2017  . RADICAL HYSTERECTOMY    . REPLACEMENT TOTAL KNEE     11/03/07 right....... 03/29/08 left    There were no vitals filed for this visit.   Subjective Assessment - 10/14/18 1206    Subjective  Patient reports pain in both knees worse in the Lt knee as well as pain in the LB and Lt hip. Patient was seen in PT a few months ago which seemed to increase the Lt hip pain. She was doing exercises in PT and  at home which seemed to worsen the symptoms.     Pertinent History  bilat TKA 2009; plantar fasiitis; hip pain; LBP; pre diabetic; CHF; arthritis multiple joints; a-fib; obesity     Diagnostic tests  xrays; MRI     Patient Stated Goals  wants to feel better     Currently in Pain?  Yes    Pain Score  5     Pain Location  Hip    Pain Orientation  Left    Pain Descriptors / Indicators  Aching;Throbbing   throbbing with walking or standing a lot    Pain Type  Chronic pain    Pain Radiating Towards  both hips - radiating into the lateral thigh     Pain Onset  More than a month ago    Pain Frequency  Intermittent    Aggravating  Factors   prolonged sitting; standing; walking     Pain Relieving Factors  sometimes lying down; heat     Multiple Pain Sites  Yes    Pain Score  6    Pain Location  Knee    Pain Orientation  Left;Right    Pain Descriptors / Indicators  Sharp;Nagging   Lt sharp; Rt nagging    Pain Type  Chronic pain    Pain Onset  More than a month ago    Pain Frequency  Intermittent    Aggravating Factors   standing and walking stairs; standing up from a chair     Pain Relieving Factors  nothing     Pain Score  6    Pain Location  Back    Pain Orientation  Left;Lower    Pain Descriptors / Indicators  Throbbing    Pain Type  Chronic pain    Pain Onset  More than a month ago    Pain Frequency  Constant    Aggravating Factors   prolonged sitting or standing     Pain Relieving Factors  lying down and tylenol          [facility] PT Assessment - 10/14/18 0001      Assessment   Medical Diagnosis  Lt trochanterid bursitis; Lt/Rt knee pain; Lt LBP    Referring Provider (PT)  Dr Clementeen Graham     Onset Date/Surgical Date  03/08/18   symptoms present for several years    Hand Dominance  Right    Next MD Visit  11/05/2018    Prior Therapy  yes a few visits in the summer 2019 - increased symptoms with PT exercises       Precautions   Precautions  None      Balance Screen   Has the patient fallen in the past 6 months  No    Has the patient had a decrease in activity level because of a fear of falling?   No    Is the patient reluctant to leave their home because of a fear of falling?   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     Leisure   sedentary; daughter helps with household chores and cooking - pt does some laundry       Observation/Other Assessments   Focus on Therapeutic Outcomes (FOTO)   64% limitation       Sensation   Additional Comments  intermittent numbness relieved with meds       Posture/Postural Control   Posture Comments  head forward; shoulders rounded and elevated; flexed forward at hips; LE's in ER       AROM   Right/Left Hip  --   limited by obesity bilat    Right/Left Knee  --   limited by obesity    Lumbar Flexion  80%    Lumbar Extension  15%    Lumbar - Right Side Bend  60% pulling Lt hip     Lumbar - Left Side Bend  55% discomfort Lt LB    Lumbar - Right Rotation  35%    Lumbar - Left Rotation  35%      Strength   Overall Strength Comments  assessed with pt sitting and supine - unable to lie prone or maintain sidelying for testing - Rt LE grossly 4/5; Lt 3+/5 to 4-/5 with exception of hip extension bilat 3-/5 to 3/3       Flexibility   Hamstrings  tight ~ 70 deg Lt; 75 deg Rt     Piriformis  tight Lt > Rt       Palpation   Palpation comment  tenderness and tightness to palpation through the Lt posterior lateral hip       Transfers   Comments  patient demonstrated difficulty with all transitional movements and transfers - independent but slow with sit to stand and sit to supine; unable to roll to prone difficulty with achieving sidelying positions       Ambulation/Gait   Ambulation/Gait  Yes    Ambulation/Gait Assistance  7: Independent    Ambulation Distance (Feet)  40 Feet   x 2    Assistive device  Rolling walker    Gait Pattern  Step-to pattern;Decreased step length - right;Decreased step length - left;Trunk flexed;Wide base of support    Ambulation Surface  Level    Gait velocity  slow     Gait Comments  difficulty with ambulation with decreased endurance noted                 Objective measurements completed on examination: See above findings.               PT Education - 10/14/18 1349    Education Details  See HEP instructions; discussed importance of exercise at length - options here and in community as well as what to start with at home     Person(s) Educated  Patient    Methods  Explanation    Comprehension  Verbalized understanding          PT Long Term Goals - 10/14/18 1356      PT LONG TERM GOAL #1   Title  Improve standing and walking tolerance to 10-15 min with appropriate assistive device 11/25/2018    Time  6    Period  Weeks    Status  New      PT LONG TERM GOAL #2   Title  Increase LE strength to 3+/5 to 4+/5 11/25/2018    Time  6    Period  Weeks  Status  New      PT LONG TERM GOAL #3   Title  Increase activity tolerance with patient reporting increased activity in her home to include light meal prep 11/25/2018    Time  6    Period  Weeks    Status  New      PT LONG TERM GOAL #4   Title  Independent in HEP with patient to transition to community based program 11/25/2018    Time  6    Period  Weeks    Status  New      PT LONG TERM GOAL #5   Title  improve Foto to </= 55% limitation 11/25/2018    Time  5    Period  Weeks    Status  New             Plan - 10/14/18 1350    Clinical Impression Statement  Charlotte [name] presents with chronic pain in LB; Lt hip and bilat knees(Lt > Rt). Symptoms have been present for years but severity has increased in the past 6-8 months. Patient has sedentary lifestyle and chronic pain. She has poor posture and alignment; limited ROM/mobility; core and LE weakness; abnormal gait pattern; dependent gait; decreased endurance; pain with functional activities. Patient will benefit from PT to address problems identified.     History and Personal Factors relevant to plan of care:  multiple orthopedic problems; cardiac problems; sedentary lifestyle; obesity    Clinical Presentation  Evolving    Clinical Presentation due to:  weakness; limited mobilty    Clinical  Decision Making  Moderate    Rehab Potential  Good    PT Frequency  2x / week    PT Duration  6 weeks    PT Treatment/Interventions  Patient/family education;ADLs/Self Care Home Management    PT Next Visit Plan  gentle exercise with focus on movement; gentle stretching for Lt hip; manual work vs DN for Lt hip and LB musculature; continue education re movement and exercise     Consulted and Agree with Plan of Care  Patient       Patient will benefit from skilled therapeutic intervention in order to improve the following deficits and impairments:  Postural dysfunction, Improper body mechanics, Pain, Decreased mobility, Decreased range of motion, Decreased strength, Decreased activity tolerance, Abnormal gait  Visit Diagnosis: Pain in left hip  Chronic pain of left knee  Bilateral low back pain without sciatica, unspecified chronicity  Other symptoms and signs involving the musculoskeletal system  Muscle weakness (generalized)     Problem List Patient Active Problem List   Diagnosis Date Noted  . Acute renal failure (ARF) (HCC) 07/13/2017  . NICM (nonischemic cardiomyopathy) (HCC) 07/12/2017  . Low back pain without sciatica 06/05/2017  . Compression fracture of L3 lumbar vertebra 06/02/2017  . Low back pain 06/01/2017  . Falls 06/01/2017  . Intractable back pain 06/01/2017  . Arteriosclerosis of abdominal aorta (HCC) 05/30/2017  . Lumbosacral strain 05/29/2017  . Symptomatic anemia 05/21/2017  . Hyponatremia 05/21/2017  . Gastrointestinal hemorrhage   . Chronic constipation 05/10/2015  . Atrial fibrillation (HCC) 11/10/2014  . Severe obesity (BMI >= 40) (HCC) 06/18/2014  . Spinal stenosis of lumbar region without neurogenic claudication 03/30/2014  . Restless legs syndrome (RLS) 03/30/2014  . Idiopathic progressive polyneuropathy 03/30/2014  . Aortic regurgitation 03/03/2014  . Diabetes mellitus type 2 in obese (HCC) 12/09/2013  . Long term current use of anticoagulant  therapy 05/14/2013  . VITAMIN D DEFICIENCY  10/31/2010  . VENTRICULAR TACHYCARDIA 05/31/2010  . SYNCOPE 04/24/2010  . DYSPNEA 04/24/2010  . CATARACTS 04/20/2010  . EDEMA 02/28/2010  . ELECTROCARDIOGRAM, ABNORMAL 09/19/2007  . Osteoarthritis of both ankles 01/01/2007  . Hyperlipidemia 08/13/2006  . Essential hypertension 07/16/2006  . RHINITIS, ALLERGIC 07/16/2006  . GASTROESOPHAGEAL REFLUX, NO ESOPHAGITIS 07/16/2006  . IRRITABLE BOWEL SYNDROME 07/16/2006  . OSTEOPENIA 07/16/2006    Lorinda Copland Rober Minion PT, MPH  10/14/2018, 2:06 PM  [facility] [address] [place], [place], [address] Phone: [phone]   Fax:  [phone]  Name: Charlotte [name] MRN: [id number] Date of Birth: [birth year]  PHYSICAL THERAPY DISCHARGE SUMMARY  Visits from Start of Care: Evaluation only   Current functional level related to goals / functional outcomes: See progress note for discharge status   Remaining deficits: Unknown     Education / Equipment: HEP  Plan: Patient agrees to discharge.  Patient goals were not met. Patient is being discharged due to not returning since the last visit.  ?????     Mumtaz Lovins P. Leonor Liv PT, MPH 12/17/18 2:14 PM

## 2018-10-14 NOTE — Telephone Encounter (Signed)
LM on surgery scheduler's VM to call our office back to see if their office has received a pre op clearance from our office. VM states that if is after 3:30 pm we will receive a call back tomorrow 1/8.

## 2018-10-14 NOTE — Patient Instructions (Signed)
Stand during commercials of 30 minute TV show twice each day   Begin walking program in home - walking 2-5 min without stopping; gradually increase walking time to 15-20 min   Check on community based programs including water exercise classes; silver sneakers; [employer] exercise program; etc

## 2018-10-16 NOTE — Telephone Encounter (Signed)
Spoke with surgery scheduler from requesting office and they confirmed that they have received clearance

## 2018-10-22 ENCOUNTER — Ambulatory Visit
Admission: RE | Admit: 2018-10-22 | Discharge: 2018-10-22 | Disposition: A | Discharge status: Home / Self Care | Payer: Medicare HMO | Source: Ambulatory Visit | Attending: Family Medicine | Admitting: Family Medicine

## 2018-10-22 DIAGNOSIS — M545 Low back pain: Secondary | ICD-10-CM | POA: Diagnosis not present

## 2018-10-22 MED ORDER — IOPAMIDOL (ISOVUE-M 200) INJECTION 41%
1.0000 mL | Freq: Once | INTRAMUSCULAR | Status: AC
  Administered 2018-10-22: 1 mL via INTRA_ARTICULAR

## 2018-10-22 MED ORDER — METHYLPREDNISOLONE ACETATE 40 MG/ML INJ SUSP (RADIOLOG
120.0000 mg | Freq: Once | INTRAMUSCULAR | Status: AC
  Administered 2018-10-22: 120 mg via INTRA_ARTICULAR

## 2018-10-22 NOTE — Discharge Instructions (Signed)
Joint Injection Discharge Instructions ° °1. After your joint injection, use ice to the affected area for the next 24 hours as a temporary increase in pain is not uncommon for a day or two after your procedure. ° °2. Resume all medications unless otherwise instructed. ° °3. Common side effects of steroids include facial flushing or redness, restlessness or inability to sleep and an increase in your blood sugar if you are a diabetic.   ° °4. Follow up with the ordering physician for post care. ° °5. If you have any of the following please call [phone]: ° °    Temperature greater than 101 °    Pain, redness or swelling at the injection site ° ° °You may resume Xarelto today! °

## 2018-11-05 ENCOUNTER — Ambulatory Visit (INDEPENDENT_AMBULATORY_CARE_PROVIDER_SITE_OTHER): Payer: Medicare HMO | Admitting: Family Medicine

## 2018-11-05 ENCOUNTER — Encounter: Payer: Self-pay | Admitting: Family Medicine

## 2018-11-05 VITALS — BP 180/81 | HR 71 | Wt 287.0 lb

## 2018-11-05 DIAGNOSIS — M47819 Spondylosis without myelopathy or radiculopathy, site unspecified: Secondary | ICD-10-CM

## 2018-11-05 DIAGNOSIS — M25562 Pain in left knee: Secondary | ICD-10-CM

## 2018-11-05 DIAGNOSIS — G8929 Other chronic pain: Secondary | ICD-10-CM

## 2018-11-05 DIAGNOSIS — M48061 Spinal stenosis, lumbar region without neurogenic claudication: Secondary | ICD-10-CM

## 2018-11-05 NOTE — Patient Instructions (Signed)
Thank you for coming in today. You should hear about the facet injection and ablation soon  Let me know if you do not hear anything.   Recheck as needed.

## 2018-11-06 ENCOUNTER — Telehealth: Payer: Self-pay | Admitting: *Deleted

## 2018-11-06 NOTE — Telephone Encounter (Signed)
   [facility] Pre-operative Risk Assessment    Request for surgical clearance:  1. What type of surgery is being performed? MEDIAL BRANCH BLOCK AND THEN WILL HAVE RADIOFREQUENCY ABLATION   2. When is this surgery scheduled? TBD   3. What type of clearance is required (medical clearance vs. Pharmacy clearance to hold med vs. Both)? BOTH  4. Are there any medications that need to be held prior to surgery and how long?HOLD XARELTO X 1 DAY BEFORE EACH INJECTION   5. Practice name and name of physician performing surgery? [facility]    6. What is your office phone number [phone]    7.   What is your office fax number [phone]  8.   Anesthesia type (None, local, MAC, general) ? LOCAL FOR THE MBB; RFA IS UNDER MODERATE SEDATION W/VERSED, FENTANYL AND LOCAL   Julaine Hua 11/06/2018, 1:12 PM  _________________________________________________________________   (provider comments below)

## 2018-11-06 NOTE — Progress Notes (Signed)
Charlotte [name] is a 83 y.o. female who presents to [facility] today for low back pain.  Charlotte [name] has been seen several times for chronic low back pain.  She most recently had facet injections at L5-S1 on the left side and notes that it provided moderate improvement in pain temporarily.  She notes the pain relief lasted only a few days.  Additionally she was found to have some trochanteric bursitis.  She notes that has improved significantly with physical therapy.  She rates her pain is moderate to severe worse with activity better with rest.  Pain interferes with her ability to complete normal activities of daily living such as cooking cleaning and dressing at times.  Additionally in the interim patient had nuclear medicine three-phase bone scan to evaluate her chronic post TKR knee pain on the left knee.  The bone scan was nonspecific but not positive for hardware loosening or infection.  He has the knee his pain is still present but not her biggest issue right now.  ROS:  As above  Exam:  BP (!) 180/81   Pulse 71   Wt 287 lb (130.2 kg)   BMI 50.84 kg/m  Wt Readings from Last 5 Encounters:  11/05/18 287 lb (130.2 kg)  10/06/18 285 lb (129.3 kg)  09/22/18 285 lb 6.4 oz (129.5 kg)  09/03/18 269 lb (122 kg)  08/11/18 278 lb (126.1 kg)   General: Well Developed, well nourished, and in no acute distress.  Neuro/Psych: Alert and oriented x3, extra-ocular muscles intact, able to move all 4 extremities, sensation grossly intact. Skin: Warm and dry, no rashes noted.  Respiratory: Not using accessory muscles, speaking in full sentences, trachea midline.  Cardiovascular: Pulses palpable, no extremity edema. Abdomen: Does not appear distended. MSK: L-spine: Nontender to midline.  Tender palpation bilateral lumbar paraspinal musculature. Range of motion limited flexion significantly limited and painful extension.  Limited and painful rotation and  lateral flexion bilaterally. Ambulates with a walker.  Lower extremity strength is intact throughout.    Lab and Radiology Results EXAM: MRI LUMBAR SPINE WITHOUT CONTRAST  TECHNIQUE: Multiplanar, multisequence MR imaging of the lumbar spine was performed. No intravenous contrast was administered.  COMPARISON:  Radiography 04/09/2018.  MRI 06/03/2017.  FINDINGS: Segmentation:  5 lumbar type vertebral bodies.  Alignment: Curvature convex to the left with the apex at L3. 2 mm degenerative anterolisthesis L4-5.  Vertebrae: Previously augmented compression fracture at L3 with loss of height of 30%. This has healed without residual edema or further loss of height. There is discogenic marrow change on the left at L1-2 with mild edema which could contribute to back pain. No new fracture.  Conus medullaris and cauda equina: Conus extends to the L1-2 level. Conus and cauda equina appear normal.  Paraspinal and other soft tissues: Negative  Disc levels:  No significant finding at T11-12 or T12-L1.  L1-2: Disc degeneration more pronounced on the left. Endplate osteophytes and bulging of the disc. Mild facet hypertrophy. Mild narrowing of the lateral recesses without visible neural compression. Discogenic marrow changes on the left could be associated with back pain, as noted above.  L2-3: Disc degeneration with circumferential protrusion, most prominent in the right foraminal region. Facet and ligamentous hypertrophy. Stenosis of both lateral recesses. Foraminal encroachment on the right by disc material could focally compress the right L2 nerve. This has worsened since the previous study.  L3-4: Endplate osteophytes and bulging of the disc. Mild facet and ligamentous hypertrophy.  Mild bilateral lateral recess stenosis. Foraminal narrowing worse on the right than the left. Some potential this could affect the right L3 nerve. Similar appearance to the previous  exam.  L4-5: Bilateral facet arthropathy with gaping, fluid-filled joints. 2 mm of anterolisthesis at this level that could worsen with standing or flexion. Bulging of the disc. Stenosis of both lateral recesses and of the foramina left more than right. Neural compression could occur on either side at this level, and would be more likely with standing or flexion.  L5-S1: No disc abnormality. Facet osteoarthritis worse on the left. No compressive stenosis. The facet arthropathy could be symptomatic.  IMPRESSION: Previously augmented compression fracture at L3 has healed without residual edema or further collapse.  L1-2: Disc degeneration more pronounced on the left with endplate osteophytes and bulging of the disc. Mild narrowing of the lateral recesses but no definite neural compression. Discogenic marrow changes worse on the left which could contribute to back pain.  L2-3 disc protrusion, most pronounced in the foraminal regions right more than left. I think this has worsened since the previous study. There is potential for neural compression in both lateral recesses and foramina, particularly the right foramen where the right L2 nerve appears to be compressed.  L3-4 lateral recess and foraminal narrowing. Foraminal narrowing on the right could affect the L3 nerve.  L4-5 facet arthropathy with 2 mm of anterolisthesis. Narrowing of the lateral recesses and foramina that could cause neural compression. This appearance could worsen with standing or flexion based on the morphology of the facet arthropathy.  L5-S1 facet osteoarthritis on the left which could be painful.   Electronically Signed   By: Nelson Chimes M.D.   On: 04/19/2018 17:57  EXAM: NUCLEAR MEDICINE 3-PHASE BONE SCAN  TECHNIQUE: Radionuclide angiographic images, immediate static blood pool images, and 3-hour delayed static images were obtained of the knees after intravenous injection of  radiopharmaceutical.  RADIOPHARMACEUTICALS:  21.1 mCi Tc-44mMDP IV  COMPARISON:  None.  FINDINGS: Vascular phase: There is symmetric perfusion of both knees.  Blood pool phase: Bilateral and symmetric blood pool activity is identified to both knees.  Delayed phase: Bilateral and symmetric radiotracer activity is identified localizing to the periprosthetic portions of the distal femurs and proximal tibias.  IMPRESSION: 1. There are no specific findings identified to suggest arthroplasty device loosening or infection within the left knee. 2. There is bilateral symmetric radiotracer uptake on all 3 phases to both knees. On the delayed phase only there is periprosthetic increased uptake to both knees within the distal femurs and proximal tibias. This is a nonspecific finding.   Electronically Signed   By: TKerby MoorsM.D.   On: 10/13/2018 16:27  I personally (independently) visualized and performed the interpretation of the images attached in this note.     Assessment and Plan: 83y.o. female with  Bilateral low back pain left worse than right.  Patient had moderate partial pain response following left L5 and S1 facet injection.  I think it is reasonable to proceed with trial of facet ablation.  Unfortunately she has diffuse significant facet DJD.  I think it is reasonable to try medial branch blocks and proceed with facet ablation at bilateral L5-S1 and L4-L5.  I had a lengthy discussion with PTaylonabout the expected outcome and duration of these treatments.  Left knee pain status post total knee replacement.  Three-phase bone scan nondiagnostic.  We will keep this issue is a back burner while proceeding with her  back pain.  May consider cool leaf procedure at some point.  PDMP not reviewed this encounter. No orders of the defined types were placed in this encounter.  No orders of the defined types were placed in this encounter.   Historical information moved  to improve visibility of documentation.  Past Medical History:  Diagnosis Date  . Aortic insufficiency   . Atrial fibrillation ([place])    a. s/p DCCV 03/2013  . CAD (coronary artery disease)    LHC 06/2010: EF 55%, mild plaque in the LAD 20-30%, otherwise normal coronary arteries  . CATARACTS   . Chronic systolic heart failure ([place])    in setting of AFib;  Echocardiogram 02/2013: Moderate LVH, EF 25-30%, anteroseptal and apical HK, moderate AI, MAC, moderate MR, moderate LAE, mild RAE, PASP 32 => after DCCV f/u Echo 8/14: Moderate LVH, EF 60-65%, normal wall motion, grade 1 diastolic dysfunction, moderate AI, moderate LAE  . Complication of anesthesia 2009   nausea and vomitting  . GERD (gastroesophageal reflux disease)   . HYPERLIPIDEMIA, MILD   . HYPERTENSION, BENIGN SYSTEMIC   . Irritable bowel syndrome   . LBBB (left bundle branch block)   . MEDIAL EPICONDYLITIS   . NSVT (nonsustained ventricular tachycardia) ([place])    during Dob Echo 2011 => normal cath  . OSTEOARTHRITIS   . OSTEOPENIA   . PONV (postoperative nausea and vomiting)   . Pre-diabetes   . RHINITIS, ALLERGIC   . SYNCOPE   . VITAMIN D DEFICIENCY    Past Surgical History:  Procedure Laterality Date  . CARDIOVERSION N/A 04/01/2013   Procedure: CARDIOVERSION;  Surgeon: Josue Hector, MD;  Location: [facility] ENDOSCOPY;  Service: Cardiovascular;  Laterality: N/A;  . CARDIOVERSION N/A 07/16/2017   Procedure: CARDIOVERSION;  Surgeon: Pixie Casino, MD;  Location: [facility] ENDOSCOPY;  Service: Cardiovascular;  Laterality: N/A;  . CARDIOVERSION N/A 07/07/2018   Procedure: CARDIOVERSION;  Surgeon: Pixie Casino, MD;  Location: [place];  Service: Cardiovascular;  Laterality: N/A;  . CATARACT EXTRACTION, BILATERAL  2012   Dr. Herbert Deaner  . COLONOSCOPY WITH PROPOFOL N/A 05/23/2017   Procedure: COLONOSCOPY WITH PROPOFOL;  Surgeon: Wonda Horner, MD;  Location[facility] ENDOSCOPY;  Service: Endoscopy;  Laterality: N/A;  .  ESOPHAGOGASTRODUODENOSCOPY (EGD) WITH PROPOFOL N/A 05/23/2017   Procedure: ESOPHAGOGASTRODUODENOSCOPY (EGD) WITH PROPOFOL;  Surgeon: Wonda Horner, MD;  Location: [facility] ENDOSCOPY;  Service: Endoscopy;  Laterality: N/A;  . IR KYPHO LUMBAR INC FX REDUCE BONE BX UNI/BIL CANNULATION INC/IMAGING  06/06/2017  . IR RADIOLOGIST EVAL & MGMT  07/01/2017  . RADICAL HYSTERECTOMY    . REPLACEMENT TOTAL KNEE     11/03/07 right....... 03/29/08 left   Social History   Tobacco Use  . Smoking status: Former Smoker    Last attempt to quit: 10/08/1970    Years since quitting: 48.1  . Smokeless tobacco: Never Used  Substance Use Topics  . Alcohol use: No   family history includes Alzheimer's disease in her mother; Arrhythmia in her mother and sister; Colon cancer in her father; Diabetes in her sister; Heart attack (age of onset: 43) in her father.  Medications: Current Outpatient Medications  Medication Sig Dispense Refill  . acetaminophen (TYLENOL) 500 MG tablet Take 1,000 mg by mouth 3 (three) times daily.    . AMBULATORY NON FORMULARY MEDICATION Medication Name: Shingrix x 1 repeat in 2- 6 months 1 vial 0  . AMBULATORY NON FORMULARY MEDICATION Medication Name: Engineer, materials. DX: M48.046 1 each 0  .  AMBULATORY NON FORMULARY MEDICATION Medication Name: Sports administrator.DX:M48.046 1 each 0  . amiodarone (PACERONE) 200 MG tablet Take 0.5 tablets (100 mg total) by mouth daily. 90 tablet 2  . Calcium Carb-Cholecalciferol (CALCIUM 600/VITAMIN D3) 600-800 MG-UNIT TABS Take 1 tablet by mouth every evening.     . Cholecalciferol (VITAMIN D PO) Take 1 tablet by mouth every evening.     . furosemide (LASIX) 20 MG tablet Take 1 tablet (20 mg total) by mouth daily. 1 tablet 0  . gabapentin (NEURONTIN) 300 MG capsule TAKE 1 CAPSULE FOUR TIMES DAILY (Patient taking differently: Take 600 mg by mouth 2 (two) times daily. ) 360 capsule 1  . losartan (COZAAR) 25 MG tablet TAKE 1 TABLET EVERY DAY 90 tablet 3  . Multiple  Vitamin (MULTIVITAMIN WITH MINERALS) TABS tablet Take 1 tablet by mouth every evening.     . Omega-3 Fatty Acids (FISH OIL PO) Take 1 capsule by mouth daily.     . polyvinyl alcohol-povidone (REFRESH) 1.4-0.6 % ophthalmic solution Place 1-2 drops into both eyes daily as needed (dry eyes).     . potassium chloride (K-DUR,KLOR-CON) 10 MEQ tablet Take 1 tablet (10 mEq total) by mouth 2 (two) times daily. 180 tablet 3  . Psyllium (METAMUCIL PO) Take 5 mLs by mouth at bedtime as needed (constipation). Mix in 8 oz liquid and drink     . rivaroxaban (XARELTO) 20 MG TABS tablet Take 1 tablet (20 mg total) by mouth daily with supper. 30 tablet 1  . vitamin B-12 (CYANOCOBALAMIN) 1000 MCG tablet Take 1,000 mcg by mouth every evening.     . carvedilol (COREG) 12.5 MG tablet Take 1 tablet (12.5 mg total) by mouth 2 (two) times daily. 180 tablet 3   No current facility-administered medications for this visit.    Allergies  Allergen Reactions  . Amlodipine     Other reaction(s): HEADACHE  . Metformin And Related Other (See Comments)    Renal failure  . Amlodipine Besylate-Valsartan Other (See Comments)    headache      Discussed warning signs or symptoms. Please see discharge instructions. Patient expresses understanding.

## 2018-11-06 NOTE — Telephone Encounter (Signed)
Patient with diagnosis of afib on xarelto for anticoagulation.    Procedure: MEDIAL BRANCH BLOCK AND THEN WILL HAVE RADIOFREQUENCY ABLATION  Date of procedure: TBD  CHADS2-VASc score of  6 (CHF, HTN, AGE, DM2, stroke/tia x 2, CAD, AGE, female)  Per office protocol, patient can hold Xarelto for 3 days prior to each procedure.

## 2018-11-12 NOTE — Telephone Encounter (Signed)
   Primary Cardiologist: Charlton Haws, MD  Chart reviewed as part of pre-operative protocol coverage. Patient was contacted 11/12/2018 in reference to pre-operative risk assessment for pending surgery as outlined below.  Charlotte [name] was last seen on 09/22/18 by Dr. Eden Emms.  Since that day, Charlotte [name] has done well with her usual activity- she has non obstructive CAD, and PAF.  She may hold xarelto 3 days prior to each injection.   Therefore, based on ACC/AHA guidelines, the patient would be at acceptable risk for the planned procedure without further cardiovascular testing.   I will route this recommendation to the requesting party via Epic fax function and remove from pre-op pool.  Please call with questions.  Nada Boozer, NP 11/12/2018, 4:33 PM

## 2018-11-18 ENCOUNTER — Ambulatory Visit
Admission: RE | Admit: 2018-11-18 | Discharge: 2018-11-18 | Disposition: A | Discharge status: Home / Self Care | Payer: Medicare HMO | Source: Ambulatory Visit | Attending: Family Medicine | Admitting: Family Medicine

## 2018-11-18 ENCOUNTER — Other Ambulatory Visit: Payer: Self-pay | Admitting: Family Medicine

## 2018-11-18 DIAGNOSIS — M25562 Pain in left knee: Secondary | ICD-10-CM

## 2018-11-18 DIAGNOSIS — G8929 Other chronic pain: Secondary | ICD-10-CM

## 2018-11-18 DIAGNOSIS — M47819 Spondylosis without myelopathy or radiculopathy, site unspecified: Secondary | ICD-10-CM

## 2018-11-18 DIAGNOSIS — M48061 Spinal stenosis, lumbar region without neurogenic claudication: Secondary | ICD-10-CM | POA: Diagnosis not present

## 2018-11-18 MED ORDER — IOPAMIDOL (ISOVUE-M 200) INJECTION 41%
1.0000 mL | Freq: Once | INTRAMUSCULAR | Status: AC
  Administered 2018-11-18: 1 mL via INTRA_ARTICULAR

## 2018-11-18 NOTE — Discharge Instructions (Signed)
Post Procedure Spinal Discharge Instruction Sheet  1. You may resume a regular diet and any medications that you routinely take (including pain medications).  2. No driving day of procedure.  3. Light activity throughout the rest of the day.  Do not do any strenuous work, exercise, bending or lifting.  The day following the procedure, you can resume normal physical activity but you should refrain from exercising or physical therapy for at least three days thereafter.   Common Side Effects:   Headaches- take your usual medications as directed by your physician.  Increase your fluid intake.  Caffeinated beverages may be helpful.  Lie flat in bed until your headache resolves.   Restlessness or inability to sleep- you may have trouble sleeping for the next few days.  Ask your referring physician if you need any medication for sleep.   Facial flushing or redness- should subside within a few days.   Increased pain- a temporary increase in pain a day or two following your procedure is not unusual.  Take your pain medication as prescribed by your referring physician.   Leg cramps  Please contact our office at [phone] for the following symptoms:  Fever greater than 100 degrees.  Headaches unresolved with medication after 2-3 days.  Increased swelling, pain, or redness at injection site.    Please take Xarelto as instructed by Dr. Benard Rink.  Do not restart this medication until he calls you later this afternoon

## 2018-11-19 ENCOUNTER — Other Ambulatory Visit: Payer: Self-pay | Admitting: Family Medicine

## 2018-11-28 ENCOUNTER — Other Ambulatory Visit: Payer: Self-pay | Admitting: Family Medicine

## 2018-11-28 ENCOUNTER — Ambulatory Visit
Admission: RE | Admit: 2018-11-28 | Discharge: 2018-11-28 | Disposition: A | Discharge status: Home / Self Care | Payer: Medicare HMO | Source: Ambulatory Visit | Attending: Family Medicine | Admitting: Family Medicine

## 2018-11-28 ENCOUNTER — Other Ambulatory Visit: Payer: Self-pay | Admitting: Cardiovascular Disease

## 2018-11-28 DIAGNOSIS — M48061 Spinal stenosis, lumbar region without neurogenic claudication: Secondary | ICD-10-CM

## 2018-11-28 DIAGNOSIS — M47819 Spondylosis without myelopathy or radiculopathy, site unspecified: Secondary | ICD-10-CM | POA: Diagnosis not present

## 2018-11-28 DIAGNOSIS — I4819 Other persistent atrial fibrillation: Secondary | ICD-10-CM

## 2018-11-28 DIAGNOSIS — I480 Paroxysmal atrial fibrillation: Secondary | ICD-10-CM

## 2018-11-28 DIAGNOSIS — M25562 Pain in left knee: Secondary | ICD-10-CM

## 2018-11-28 DIAGNOSIS — G8929 Other chronic pain: Secondary | ICD-10-CM

## 2018-11-28 MED ORDER — SODIUM CHLORIDE 0.9 % IV SOLN
Freq: Once | INTRAVENOUS | Status: AC
  Administered 2018-11-28: 11:00:00 via INTRAVENOUS

## 2018-11-28 MED ORDER — RIVAROXABAN 20 MG PO TABS: 20.0000 mg | ORAL_TABLET | Freq: Every day | ORAL | 2 refills | Status: DC

## 2018-11-28 MED ORDER — LOSARTAN POTASSIUM 25 MG PO TABS: 25.0000 mg | ORAL_TABLET | Freq: Every day | ORAL | 3 refills | Status: DC

## 2018-11-28 MED ORDER — AMIODARONE HCL 200 MG PO TABS: 100.0000 mg | ORAL_TABLET | Freq: Every day | ORAL | 2 refills | Status: DC

## 2018-11-28 MED ORDER — KETOROLAC TROMETHAMINE 30 MG/ML IJ SOLN
30.0000 mg | Freq: Once | INTRAMUSCULAR | Status: AC
  Administered 2018-11-28: 30 mg via INTRAVENOUS

## 2018-11-28 MED ORDER — FENTANYL CITRATE (PF) 100 MCG/2ML IJ SOLN
25.0000 ug | INTRAMUSCULAR | Status: DC | PRN
  Administered 2018-11-28 (×2): 25 ug via INTRAVENOUS

## 2018-11-28 MED ORDER — CARVEDILOL 12.5 MG PO TABS: 12.5000 mg | ORAL_TABLET | Freq: Two times a day (BID) | ORAL | 3 refills | Status: DC

## 2018-11-28 MED ORDER — POTASSIUM CHLORIDE CRYS ER 10 MEQ PO TBCR: 10.0000 meq | EXTENDED_RELEASE_TABLET | Freq: Two times a day (BID) | ORAL | 3 refills | Status: DC

## 2018-11-28 MED ORDER — MIDAZOLAM HCL 2 MG/2ML IJ SOLN
1.0000 mg | INTRAMUSCULAR | Status: DC | PRN
  Administered 2018-11-28: 1 mg via INTRAVENOUS
  Administered 2018-11-28: 0.5 mg via INTRAVENOUS

## 2018-11-28 NOTE — Telephone Encounter (Signed)
Pt pharmacy is requesting a refill on these medications. Rudi Coco, NP prescribed these medications. Please address

## 2018-11-28 NOTE — Discharge Instructions (Signed)
Radio Frequency Ablation Post Procedure Discharge Instructions  1. May resume a regular diet and any medications that you routinely take (including pain medications). 2. No driving day of procedure. 3. Upon discharge go home and rest for at least 4 hours.  May use an ice pack as needed to injection sites on back. 4. Remove bandades later, today.    Please contact our office at [phone] for the following symptoms:   Fever greater than 100 degrees  Increased swelling, pain, or redness at injection site.   Thank you for visiting [facility].  YOU MAY RESTART YOUR XARELTO TODAY.

## 2018-12-02 ENCOUNTER — Other Ambulatory Visit: Payer: Self-pay | Admitting: *Deleted

## 2018-12-02 MED ORDER — GABAPENTIN 300 MG PO CAPS: 600.0000 mg | ORAL_CAPSULE | Freq: Two times a day (BID) | ORAL | 1 refills | Status: DC

## 2018-12-11 ENCOUNTER — Ambulatory Visit (INDEPENDENT_AMBULATORY_CARE_PROVIDER_SITE_OTHER): Payer: Medicare HMO | Admitting: Family Medicine

## 2018-12-11 ENCOUNTER — Encounter: Payer: Self-pay | Admitting: Family Medicine

## 2018-12-11 VITALS — BP 157/68 | HR 64 | Ht 63.0 in | Wt 295.0 lb

## 2018-12-11 DIAGNOSIS — E1169 Type 2 diabetes mellitus with other specified complication: Secondary | ICD-10-CM | POA: Diagnosis not present

## 2018-12-11 DIAGNOSIS — M47819 Spondylosis without myelopathy or radiculopathy, site unspecified: Secondary | ICD-10-CM | POA: Diagnosis not present

## 2018-12-11 DIAGNOSIS — I10 Essential (primary) hypertension: Secondary | ICD-10-CM | POA: Diagnosis not present

## 2018-12-11 DIAGNOSIS — I4891 Unspecified atrial fibrillation: Secondary | ICD-10-CM | POA: Diagnosis not present

## 2018-12-11 DIAGNOSIS — E669 Obesity, unspecified: Secondary | ICD-10-CM

## 2018-12-11 DIAGNOSIS — R208 Other disturbances of skin sensation: Secondary | ICD-10-CM | POA: Diagnosis not present

## 2018-12-11 LAB — POCT GLYCOSYLATED HEMOGLOBIN (HGB A1C): Hemoglobin A1C: 5.9 % — AB (ref 4.0–5.6)

## 2018-12-11 NOTE — Progress Notes (Signed)
Subjective:    CC:   HPI:  Diabetes - no hypoglycemic events. No wounds or sores that are not healing well. No increased thirst or urination. Checking glucose at home. Taking medications as prescribed without any side effects.  Hypertension- Pt denies chest pain, SOB, dizziness, or heart palpitations.  Taking meds as directed w/o problems.  Denies medication side effects.    Atrial Fibrillation-she is on Xarelto, carvedilol and Pacerone.  Also remind me that she is actually seen the vein and vascular doctor last year and had an evaluation done.  He had recommended doing some vein ablation and possibly even some stenting.  She wanted to know if she should possibly go back and see him.  Past medical history, Surgical history, Family history not pertinant except as noted below, Social history, Allergies, and medications have been entered into the medical record, reviewed, and corrections made.   Review of Systems: No fevers, chills, night sweats, weight loss, chest pain, or shortness of breath.   Objective:    General: Well Developed, well nourished, and in no acute distress.  Neuro: Alert and oriented x3, extra-ocular muscles intact, sensation grossly intact.  HEENT: Normocephalic, atraumatic  Skin: Warm and dry, no rashes. Cardiac: Regular rate and rhythm, no murmurs rubs or gallops, no lower extremity edema.  Respiratory: Clear to auscultation bilaterally. Not using accessory muscles, speaking in full sentences.   Impression and Recommendations:    DM -controlled.  Hemoglobin A1c of 5.9 today.  Continue current regimen did encourage weight loss that she has gained some weight since I last saw her and discussed the importance of this.  HTN - BP still elevatedon recheck. F/U in 2 weeks for nurse visit to recheck pressure.    Burning of lower anterior legs. -Courage to get back in with her vein and vascular doctor.  She is not really having any burning in her feet so I think this could  be coming from her varicosities.  Consider having the treatments done and then if not improving could consider a nerve conduction study.  Low back pain-she is actually responded really well to the facet injections but now is having pain on the opposite side.  Encouraged her to schedule a follow-up appointment with Dr. Denyse Amass.

## 2018-12-12 ENCOUNTER — Encounter: Payer: Self-pay | Admitting: Family Medicine

## 2018-12-15 ENCOUNTER — Encounter: Payer: Self-pay | Admitting: Family Medicine

## 2018-12-15 ENCOUNTER — Ambulatory Visit (INDEPENDENT_AMBULATORY_CARE_PROVIDER_SITE_OTHER): Payer: Medicare HMO | Admitting: Family Medicine

## 2018-12-15 VITALS — BP 175/94 | HR 63

## 2018-12-15 DIAGNOSIS — H527 Unspecified disorder of refraction: Secondary | ICD-10-CM | POA: Diagnosis not present

## 2018-12-15 DIAGNOSIS — H02834 Dermatochalasis of left upper eyelid: Secondary | ICD-10-CM | POA: Diagnosis not present

## 2018-12-15 DIAGNOSIS — M48061 Spinal stenosis, lumbar region without neurogenic claudication: Secondary | ICD-10-CM | POA: Diagnosis not present

## 2018-12-15 DIAGNOSIS — H353131 Nonexudative age-related macular degeneration, bilateral, early dry stage: Secondary | ICD-10-CM | POA: Diagnosis not present

## 2018-12-15 DIAGNOSIS — H43813 Vitreous degeneration, bilateral: Secondary | ICD-10-CM | POA: Diagnosis not present

## 2018-12-15 DIAGNOSIS — Z961 Presence of intraocular lens: Secondary | ICD-10-CM | POA: Diagnosis not present

## 2018-12-15 DIAGNOSIS — H02831 Dermatochalasis of right upper eyelid: Secondary | ICD-10-CM | POA: Diagnosis not present

## 2018-12-15 DIAGNOSIS — E119 Type 2 diabetes mellitus without complications: Secondary | ICD-10-CM | POA: Diagnosis not present

## 2018-12-15 NOTE — Progress Notes (Signed)
Charlotte [name] is a 83 y.o. female who presents to [facility] [place] today for follow-up low back pain.  Charlotte [name] had left-sided lumbar facet ablation February 21.  She notes considerable improvement in her back pain.  She notes a little bit of right-sided soreness but overall is very happy with how things are going.  Additionally she notes that she try to discontinue her gabapentin and since she discontinued gabapentin she is developed some leg pain and numb tingly sensation bilateral lower extremities.  She has restarted it and notes that her symptoms are improving already.    ROS as above:  Exam:  BP (!) 175/94   Pulse 63  Wt Readings from Last 5 Encounters:  12/11/18 295 lb (133.8 kg)  11/05/18 287 lb (130.2 kg)  10/06/18 285 lb (129.3 kg)  09/22/18 285 lb 6.4 oz (129.5 kg)  09/03/18 269 lb (122 kg)    Gen: Well NAD HEENT: EOMI,  MMM Lungs: Normal work of breathing. CTABL Heart: RRR no MRG Abd: NABS, Soft. Nondistended, Nontender Exts: Brisk capillary refill, warm and well perfused.  L-spine: Nontender to spinal midline.  Mildly tender to palpation left lumbar paraspinal musculature. Antalgic gait.  Lab and Radiology Results Lumbar x-ray and MRI images from 2019 and 2020 personally independently reviewed.  Significant degenerative changes present.   Assessment and Plan: 83 y.o. female with  Low back pain: Significant improvement following facet ablation on the left side.  Proceed with watchful waiting and repeat injections as needed.  Discussed pragmatic approach.  Work on [employer] as well.  Reduce calories will certainly be helpful.  Discussed calorie goals.  Plan for 1700 cal/day.  Recommend gentle exercise if possible.  Lower extremity paresthesias: Restart gabapentin as tolerated.  Consider Lyrica in the future as needed.  PDMP not reviewed this  encounter. No orders of the defined types were placed in this encounter.  No orders of the defined types were placed in this encounter.    Historical information moved to improve visibility of documentation.  Past Medical History:  Diagnosis Date  . Aortic insufficiency   . Atrial fibrillation ([place])    a. s/p DCCV 03/2013  . CAD (coronary artery disease)    LHC 06/2010: EF 55%, mild plaque in the LAD 20-30%, otherwise normal coronary arteries  . CATARACTS   . Chronic systolic heart failure ([place])    in setting of AFib;  Echocardiogram 02/2013: Moderate LVH, EF 25-30%, anteroseptal and apical HK, moderate AI, MAC, moderate MR, moderate LAE, mild RAE, PASP 32 => after DCCV f/u Echo 8/14: Moderate LVH, EF 60-65%, normal wall motion, grade 1 diastolic dysfunction, moderate AI, moderate LAE  . Complication of anesthesia 2009   nausea and vomitting  . GERD (gastroesophageal reflux disease)   . HYPERLIPIDEMIA, MILD   . HYPERTENSION, BENIGN SYSTEMIC   . Irritable bowel syndrome   . LBBB (left bundle branch block)   . MEDIAL EPICONDYLITIS   . NSVT (nonsustained ventricular tachycardia) ([place])    during Dob Echo 2011 => normal cath  . OSTEOARTHRITIS   . OSTEOPENIA   . PONV (postoperative nausea and vomiting)   . Pre-diabetes   . RHINITIS, ALLERGIC   . SYNCOPE   . VITAMIN D DEFICIENCY    Past Surgical History:  Procedure Laterality Date  . CARDIOVERSION N/A 04/01/2013   Procedure: CARDIOVERSION;  Surgeon: Josue Hector, MD;  Location: [place];  Service: Cardiovascular;  Laterality:  N/A;  . CARDIOVERSION N/A 07/16/2017   Procedure: CARDIOVERSION;  Surgeon: Pixie Casino, MD;  Location: [facility] ENDOSCOPY;  Service: Cardiovascular;  Laterality: N/A;  . CARDIOVERSION N/A 07/07/2018   Procedure: CARDIOVERSION;  Surgeon: Pixie Casino, MD;  Location: [place];  Service: Cardiovascular;  Laterality: N/A;  . CATARACT EXTRACTION, BILATERAL  2012   Dr. Herbert Deaner  . COLONOSCOPY WITH PROPOFOL  N/A 05/23/2017   Procedure: COLONOSCOPY WITH PROPOFOL;  Surgeon: Wonda Horner, MD;  Location: [facility] ENDOSCOPY;  Service: Endoscopy;  Laterality: N/A;  . ESOPHAGOGASTRODUODENOSCOPY (EGD) WITH PROPOFOL N/A 05/23/2017   Procedure: ESOPHAGOGASTRODUODENOSCOPY (EGD) WITH PROPOFOL;  Surgeon: Wonda Horner, MD;  Location: [facility] ENDOSCOPY;  Service: Endoscopy;  Laterality: N/A;  . IR KYPHO LUMBAR INC FX REDUCE BONE BX UNI/BIL CANNULATION INC/IMAGING  06/06/2017  . IR RADIOLOGIST EVAL & MGMT  07/01/2017  . RADICAL HYSTERECTOMY    . REPLACEMENT TOTAL KNEE     11/03/07 right....... 03/29/08 left   Social History   Tobacco Use  . Smoking status: Former Smoker    Last attempt to quit: 10/08/1970    Years since quitting: 48.2  . Smokeless tobacco: Never Used  Substance Use Topics  . Alcohol use: No   family history includes Alzheimer's disease in her mother; Arrhythmia in her mother and sister; Colon cancer in her father; Diabetes in her sister; Heart attack (age of onset: 23) in her father.  Medications: Current Outpatient Medications  Medication Sig Dispense Refill  . acetaminophen (TYLENOL) 500 MG tablet Take 1,000 mg by mouth 3 (three) times daily.    . AMBULATORY NON FORMULARY MEDICATION Medication Name: Shingrix x 1 repeat in 2- 6 months 1 vial 0  . amiodarone (PACERONE) 200 MG tablet Take 0.5 tablets (100 mg total) by mouth daily. 45 tablet 2  . Calcium Carb-Cholecalciferol (CALCIUM 600/VITAMIN D3) 600-800 MG-UNIT TABS Take 1 tablet by mouth every evening.     . carvedilol (COREG) 12.5 MG tablet Take 1 tablet (12.5 mg total) by mouth 2 (two) times daily. 180 tablet 3  . Cholecalciferol (VITAMIN D PO) Take 1 tablet by mouth every evening.     . furosemide (LASIX) 20 MG tablet Take 1 tablet (20 mg total) by mouth daily. 1 tablet 0  . gabapentin (NEURONTIN) 300 MG capsule Take 2 capsules (600 mg total) by mouth 2 (two) times daily. 360 capsule 1  . losartan (COZAAR) 25 MG tablet Take 1 tablet (25 mg  total) by mouth daily. 90 tablet 3  . Multiple Vitamin (MULTIVITAMIN WITH MINERALS) TABS tablet Take 1 tablet by mouth every evening.     . Omega-3 Fatty Acids (FISH OIL PO) Take 1 capsule by mouth daily.     . polyvinyl alcohol-povidone (REFRESH) 1.4-0.6 % ophthalmic solution Place 1-2 drops into both eyes daily as needed (dry eyes).     . potassium chloride (K-DUR,KLOR-CON) 10 MEQ tablet Take 1 tablet (10 mEq total) by mouth 2 (two) times daily. 180 tablet 3  . Psyllium (METAMUCIL PO) Take 5 mLs by mouth at bedtime as needed (constipation). Mix in 8 oz liquid and drink     . rivaroxaban (XARELTO) 20 MG TABS tablet Take 1 tablet (20 mg total) by mouth daily with supper. 90 tablet 2  . vitamin B-12 (CYANOCOBALAMIN) 1000 MCG tablet Take 1,000 mcg by mouth every evening.      No current facility-administered medications for this visit.    Allergies  Allergen Reactions  . Metformin And Related Other (See Comments)  Renal failure  . Amlodipine Other (See Comments)    HEADACHE     Discussed warning signs or symptoms. Please see discharge instructions. Patient expresses understanding.

## 2018-12-15 NOTE — Patient Instructions (Addendum)
Thank you for coming in today. Set a calorie goal for 1700 calories per day.  Use a food log.  Let me know if your pain returns or worsens.  Continue gabapentin. Let me know what you settle on.

## 2018-12-26 ENCOUNTER — Ambulatory Visit: Payer: Medicare HMO

## 2019-01-03 ENCOUNTER — Emergency Department (HOSPITAL_COMMUNITY)
Admission: EM | Admit: 2019-01-03 | Discharge: 2019-01-03 | Disposition: A | Discharge status: Home / Self Care | Payer: Medicare HMO | Attending: Emergency Medicine | Admitting: Emergency Medicine

## 2019-01-03 ENCOUNTER — Other Ambulatory Visit: Payer: Self-pay

## 2019-01-03 DIAGNOSIS — I10 Essential (primary) hypertension: Secondary | ICD-10-CM | POA: Diagnosis not present

## 2019-01-03 DIAGNOSIS — R58 Hemorrhage, not elsewhere classified: Secondary | ICD-10-CM | POA: Diagnosis not present

## 2019-01-03 DIAGNOSIS — R300 Dysuria: Secondary | ICD-10-CM | POA: Insufficient documentation

## 2019-01-03 DIAGNOSIS — Z7901 Long term (current) use of anticoagulants: Secondary | ICD-10-CM | POA: Diagnosis not present

## 2019-01-03 DIAGNOSIS — Z96652 Presence of left artificial knee joint: Secondary | ICD-10-CM | POA: Diagnosis not present

## 2019-01-03 DIAGNOSIS — Z79899 Other long term (current) drug therapy: Secondary | ICD-10-CM | POA: Insufficient documentation

## 2019-01-03 DIAGNOSIS — R040 Epistaxis: Secondary | ICD-10-CM

## 2019-01-03 DIAGNOSIS — Z87891 Personal history of nicotine dependence: Secondary | ICD-10-CM | POA: Diagnosis not present

## 2019-01-03 DIAGNOSIS — E119 Type 2 diabetes mellitus without complications: Secondary | ICD-10-CM | POA: Insufficient documentation

## 2019-01-03 LAB — BASIC METABOLIC PANEL
Anion gap: 7 (ref 5–15)
BUN: 27 mg/dL — ABNORMAL HIGH (ref 8–23)
CO2: 26 mmol/L (ref 22–32)
Calcium: 8.9 mg/dL (ref 8.9–10.3)
Chloride: 103 mmol/L (ref 98–111)
Creatinine, Ser: 0.92 mg/dL (ref 0.44–1.00)
GFR calc Af Amer: >60 mL/min (ref >60)
GFR calc non Af Amer: 58 mL/min — ABNORMAL LOW (ref >60)
Glucose, Bld: 123 mg/dL — ABNORMAL HIGH (ref 70–99)
Potassium: 3.6 mmol/L (ref 3.5–5.1)
Sodium: 136 mmol/L (ref 135–145)

## 2019-01-03 LAB — URINALYSIS, ROUTINE W REFLEX MICROSCOPIC
Bilirubin Urine: NEGATIVE
Glucose, UA: NEGATIVE mg/dL
Hgb urine dipstick: NEGATIVE
Ketones, ur: NEGATIVE mg/dL
Leukocytes,Ua: NEGATIVE
Nitrite: NEGATIVE
Protein, ur: NEGATIVE mg/dL
Specific Gravity, Urine: 1.008 (ref 1.005–1.030)
pH: 5 (ref 5.0–8.0)

## 2019-01-03 LAB — CBC WITH DIFFERENTIAL/PLATELET
Abs Immature Granulocytes: 0.02 10*3/uL (ref 0.00–0.07)
Basophils Absolute: 0 10*3/uL (ref 0.0–0.1)
Basophils Relative: 0 %
Eosinophils Absolute: 0.1 10*3/uL (ref 0.0–0.5)
Eosinophils Relative: 1 %
HCT: 40 % (ref 36.0–46.0)
Hemoglobin: 12.3 g/dL (ref 12.0–15.0)
IMMATURE GRANULOCYTES: 0 %
LYMPHS PCT: 17 %
Lymphs Abs: 1.3 10*3/uL (ref 0.7–4.0)
MCH: 29.4 pg (ref 26.0–34.0)
MCHC: 30.8 g/dL (ref 30.0–36.0)
MCV: 95.5 fL (ref 80.0–100.0)
Monocytes Absolute: 0.6 10*3/uL (ref 0.1–1.0)
Monocytes Relative: 7 %
NEUTROS PCT: 75 %
Neutro Abs: 5.8 10*3/uL (ref 1.7–7.7)
Platelets: 197 10*3/uL (ref 150–400)
RBC: 4.19 MIL/uL (ref 3.87–5.11)
RDW: 13.3 % (ref 11.5–15.5)
WBC: 7.7 10*3/uL (ref 4.0–10.5)
nRBC: 0 % (ref 0.0–0.2)

## 2019-01-03 LAB — POC OCCULT BLOOD, ED: Fecal Occult Bld: POSITIVE — AB

## 2019-01-03 NOTE — Discharge Instructions (Addendum)
The testing today indicates that your hemoglobin is good.  Bleeding from your nose will likely improve with use of Afrin for 3 days, 1 to 2 sprays in each nostril twice a day.  Do not use Afrin longer than 3 days.  Also use Vaseline, applied to the inside of your nose to improve moisturization and decrease bleeding.  Return here, if needed, for problems.

## 2019-01-03 NOTE — ED Provider Notes (Signed)
[facility] EMERGENCY DEPARTMENT Provider Note   CSN: [id number] Arrival date & time: 01/03/19  1117    History   Chief Complaint Chief Complaint  Patient presents with  . Epistaxis    HPI Charlotte [name] is a 83 y.o. female.     HPI   She presents for evaluation of intermittent nosebleeding for several weeks.  She has also noticed "black tarry stool," for several months.  She denies weakness, dizziness, nausea, vomiting, chest pain, shortness of breath, headache or back pain.  No recent rhinorrhea, sore throat, or other sinus symptoms.  There are no other known modifying factors.  Past Medical History:  Diagnosis Date  . Aortic insufficiency   . Atrial fibrillation ([place])    a. s/p DCCV 03/2013  . CAD (coronary artery disease)    LHC 06/2010: EF 55%, mild plaque in the LAD 20-30%, otherwise normal coronary arteries  . CATARACTS   . Chronic systolic heart failure ([place])    in setting of AFib;  Echocardiogram 02/2013: Moderate LVH, EF 25-30%, anteroseptal and apical HK, moderate AI, MAC, moderate MR, moderate LAE, mild RAE, PASP 32 => after DCCV f/u Echo 8/14: Moderate LVH, EF 60-65%, normal wall motion, grade 1 diastolic dysfunction, moderate AI, moderate LAE  . Complication of anesthesia 2009   nausea and vomitting  . GERD (gastroesophageal reflux disease)   . HYPERLIPIDEMIA, MILD   . HYPERTENSION, BENIGN SYSTEMIC   . Irritable bowel syndrome   . LBBB (left bundle branch block)   . MEDIAL EPICONDYLITIS   . NSVT (nonsustained ventricular tachycardia) ([place])    during Dob Echo 2011 => normal cath  . OSTEOARTHRITIS   . OSTEOPENIA   . PONV (postoperative nausea and vomiting)   . Pre-diabetes   . RHINITIS, ALLERGIC   . SYNCOPE   . VITAMIN D DEFICIENCY     Patient Active Problem List   Diagnosis Date Noted  . NICM (nonischemic cardiomyopathy) ([place]) 07/12/2017  . Low back pain without sciatica 06/05/2017  . Compression fracture of L3 lumbar vertebra  06/02/2017  . Low back pain 06/01/2017  . Falls 06/01/2017  . Intractable back pain 06/01/2017  . Arteriosclerosis of abdominal aorta ([place]) 05/30/2017  . Lumbosacral strain 05/29/2017  . Symptomatic anemia 05/21/2017  . Hyponatremia 05/21/2017  . Gastrointestinal hemorrhage   . Chronic constipation 05/10/2015  . Atrial fibrillation ([place]) 11/10/2014  . Severe obesity (BMI >= 40) ([place]) 06/18/2014  . Spinal stenosis of lumbar region without neurogenic claudication 03/30/2014  . Restless legs syndrome (RLS) 03/30/2014  . Idiopathic progressive polyneuropathy 03/30/2014  . Aortic regurgitation 03/03/2014  . Diabetes mellitus type 2 in obese ([place]) 12/09/2013  . Long term current use of anticoagulant therapy 05/14/2013  . VITAMIN D DEFICIENCY 10/31/2010  . VENTRICULAR TACHYCARDIA 05/31/2010  . SYNCOPE 04/24/2010  . DYSPNEA 04/24/2010  . CATARACTS 04/20/2010  . EDEMA 02/28/2010  . ELECTROCARDIOGRAM, ABNORMAL 09/19/2007  . Osteoarthritis of both ankles 01/01/2007  . Hyperlipidemia 08/13/2006  . Essential hypertension 07/16/2006  . RHINITIS, ALLERGIC 07/16/2006  . GASTROESOPHAGEAL REFLUX, NO ESOPHAGITIS 07/16/2006  . IRRITABLE BOWEL SYNDROME 07/16/2006  . OSTEOPENIA 07/16/2006    Past Surgical History:  Procedure Laterality Date  . CARDIOVERSION N/A 04/01/2013   Procedure: CARDIOVERSION;  Surgeon: Josue Hector, MD;  Location: [facility] ENDOSCOPY;  Service: Cardiovascular;  Laterality: N/A;  . CARDIOVERSION N/A 07/16/2017   Procedure: CARDIOVERSION;  Surgeon: Pixie Casino, MD;  Location: [place];  Service: Cardiovascular;  Laterality: N/A;  . CARDIOVERSION  N/A 07/07/2018   Procedure: CARDIOVERSION;  Surgeon: Pixie Casino, MD;  Location: [place];  Service: Cardiovascular;  Laterality: N/A;  . CATARACT EXTRACTION, BILATERAL  2012   Dr. Herbert Deaner  . COLONOSCOPY WITH PROPOFOL N/A 05/23/2017   Procedure: COLONOSCOPY WITH PROPOFOL;  Surgeon: Wonda Horner, MD;  Location[facility]  ENDOSCOPY;  Service: Endoscopy;  Laterality: N/A;  . ESOPHAGOGASTRODUODENOSCOPY (EGD) WITH PROPOFOL N/A 05/23/2017   Procedure: ESOPHAGOGASTRODUODENOSCOPY (EGD) WITH PROPOFOL;  Surgeon: Wonda Horner, MD;  Location: [facility] ENDOSCOPY;  Service: Endoscopy;  Laterality: N/A;  . IR KYPHO LUMBAR INC FX REDUCE BONE BX UNI/BIL CANNULATION INC/IMAGING  06/06/2017  . IR RADIOLOGIST EVAL & MGMT  07/01/2017  . RADICAL HYSTERECTOMY    . REPLACEMENT TOTAL KNEE     11/03/07 right....... 03/29/08 left     OB History   No obstetric history on file.      Home Medications    Prior to Admission medications   Medication Sig Start Date End Date Taking? Authorizing Provider  acetaminophen (TYLENOL) 500 MG tablet Take 1,000 mg by mouth 3 (three) times daily.    [provider]  AMBULATORY NON FORMULARY MEDICATION Medication Name: Shingrix x 1 repeat in 2- 6 months 08/11/18   [name], MD  amiodarone (PACERONE) 200 MG tablet Take 0.5 tablets (100 mg total) by mouth daily. 11/28/18   Sherran Needs, NP  Calcium Carb-Cholecalciferol (CALCIUM 600/VITAMIN D3) 600-800 MG-UNIT TABS Take 1 tablet by mouth every evening.     [provider]  carvedilol (COREG) 12.5 MG tablet Take 1 tablet (12.5 mg total) by mouth 2 (two) times daily. 11/28/18 02/26/19  Sherran Needs, NP  Cholecalciferol (VITAMIN D PO) Take 1 tablet by mouth every evening.     [provider]  furosemide (LASIX) 20 MG tablet Take 1 tablet (20 mg total) by mouth daily. 07/14/17   Domenic Polite, MD  gabapentin (NEURONTIN) 300 MG capsule Take 2 capsules (600 mg total) by mouth 2 (two) times daily. 12/02/18   [name], MD  losartan (COZAAR) 25 MG tablet Take 1 tablet (25 mg total) by mouth daily. 11/28/18   Josue Hector, MD  Multiple Vitamin (MULTIVITAMIN WITH MINERALS) TABS tablet Take 1 tablet by mouth every evening.     [provider]  Omega-3 Fatty Acids (FISH OIL PO) Take 1 capsule by mouth  daily.     [provider]  polyvinyl alcohol-povidone (REFRESH) 1.4-0.6 % ophthalmic solution Place 1-2 drops into both eyes daily as needed (dry eyes).     [provider]  potassium chloride (K-DUR,KLOR-CON) 10 MEQ tablet Take 1 tablet (10 mEq total) by mouth 2 (two) times daily. 11/28/18   Josue Hector, MD  Psyllium (METAMUCIL PO) Take 5 mLs by mouth at bedtime as needed (constipation). Mix in 8 oz liquid and drink     [provider]  rivaroxaban (XARELTO) 20 MG TABS tablet Take 1 tablet (20 mg total) by mouth daily with supper. 11/28/18   Sherran Needs, NP  vitamin B-12 (CYANOCOBALAMIN) 1000 MCG tablet Take 1,000 mcg by mouth every evening.     [provider]    Family History Family History  Problem Relation Age of Onset  . Arrhythmia Mother        afib  . Alzheimer's disease Mother   . Colon cancer Father   . Heart attack Father 92  . Diabetes Sister   . Arrhythmia Sister  afib    Social History Social History   Tobacco Use  . Smoking status: Former Smoker    Last attempt to quit: 10/08/1970    Years since quitting: 48.2  . Smokeless tobacco: Never Used  Substance Use Topics  . Alcohol use: No  . Drug use: No     Allergies   Metformin and related and Amlodipine   Review of Systems Review of Systems  All other systems reviewed and are negative.    Physical Exam Updated Vital Signs BP (!) 158/76   Pulse 67   Temp 98.1 F (36.7 C) (Oral)   Resp 20   Ht 5' 3"  (1.6 m)   Wt 121.6 kg   SpO2 97%   BMI 47.47 kg/m   Physical Exam Vitals signs and nursing note reviewed.  Constitutional:      General: She is not in acute distress.    Appearance: She is well-developed. She is obese. She is not ill-appearing, toxic-appearing or diaphoretic.  HENT:     Head: Normocephalic and atraumatic.     Right Ear: External ear normal.     Left Ear: External ear normal.     Nose: No congestion or rhinorrhea.     Comments:  Possible donor site for nosebleeding, right anterior septum.  No clots in nares.    Mouth/Throat:     Pharynx: No oropharyngeal exudate or posterior oropharyngeal erythema.     Comments: No blood in oropharynx Eyes:     Conjunctiva/sclera: Conjunctivae normal.     Pupils: Pupils are equal, round, and reactive to light.  Neck:     Musculoskeletal: Normal range of motion and neck supple.     Trachea: Phonation normal.  Cardiovascular:     Rate and Rhythm: Normal rate and regular rhythm.     Heart sounds: Normal heart sounds.  Pulmonary:     Effort: Pulmonary effort is normal.     Breath sounds: Normal breath sounds.  Abdominal:     General: There is no distension.     Palpations: Abdomen is soft.     Tenderness: There is no abdominal tenderness.  Musculoskeletal: Normal range of motion.  Skin:    General: Skin is warm and dry.  Neurological:     Mental Status: She is alert and oriented to person, place, and time.     Cranial Nerves: No cranial nerve deficit.     Sensory: No sensory deficit.     Motor: No abnormal muscle tone.     Coordination: Coordination normal.  Psychiatric:        Behavior: Behavior normal.        Thought Content: Thought content normal.        Judgment: Judgment normal.      ED Treatments / Results  Labs (all labs ordered are listed, but only abnormal results are displayed) Labs Reviewed  BASIC METABOLIC PANEL - Abnormal; Notable for the following components:      Result Value   Glucose, Bld 123 (*)    BUN 27 (*)    GFR calc non Af Amer 58 (*)    All other components within normal limits  URINALYSIS, ROUTINE W REFLEX MICROSCOPIC - Abnormal; Notable for the following components:   Color, Urine COLORLESS (*)    All other components within normal limits  POC OCCULT BLOOD, ED - Abnormal; Notable for the following components:   Fecal Occult Bld POSITIVE (*)    All other components within normal limits  CBC WITH  DIFFERENTIAL/PLATELET    EKG None   Radiology No results found.  Procedures Procedures (including critical care time)  Medications Ordered in ED Medications - No data to display   Initial Impression / Assessment and Plan / ED Course  I have reviewed the triage vital signs and the nursing notes.  Pertinent labs & imaging results that were available during my care of the patient were reviewed by me and considered in my medical decision making (see chart for details).  Clinical Course as of Jan 02 1341  Sat Jan 03, 2019  1320 Normal except glucose high, BUN high, GFR low  Basic metabolic panel(!) [EW]  3748 Abnormal, blood present  POC occult blood, ED Provider will collect(!) [EW]  1320 Normal  CBC with Differential [EW]  1339 Normal  Urinalysis, Routine w reflex microscopic(!) [EW]    Clinical Course User Index [EW] Daleen Bo, MD        Patient Vitals for the past 24 hrs:  BP Temp Temp src Pulse Resp SpO2 Height Weight  01/03/19 1245 (!) 158/76 - - 67 20 97 % - -  01/03/19 1215 (!) 162/72 - - 65 - 97 % - -  01/03/19 1145 (!) 153/82 - - 72 - 96 % - -  01/03/19 1125 (!) 167/83 98.1 F (36.7 C) Oral 78 20 98 % - -  01/03/19 1121 - - - - - - 5' 3"  (1.6 m) 121.6 kg    1:42 PM Reevaluation with update and discussion. After initial assessment and treatment, an updated evaluation reveals patient remains comfortable, no more nosebleeding.  At this time she requests urine checked for infection because she is having "burning."  When she urinates.  Patient reports that she discussed this with her daughter who requested a urine test. Daleen Bo   Medical Decision Making: Nosebleeding, nonspecific, without signs for hemodynamic compromise.  Suspect irritant mucosal bleed.  No indication for placement of nasal packing.  No evidence for urinary tract infection.  Patient stable for discharge with outpatient management.  CRITICAL CARE-no Performed by: Daleen Bo  Nursing Notes Reviewed/ Care Coordinated  Applicable Imaging Reviewed Interpretation of Laboratory Data incorporated into ED treatment  The patient appears reasonably screened and/or stabilized for discharge and I doubt any other medical condition or other [facility] requiring further screening, evaluation, or treatment in the ED at this time prior to discharge.  Plan: Home Medications-continue usual; Home Treatments-rest, fluids; return here if the recommended treatment, does not improve the symptoms; Recommended follow up-PCP, PRN    Final Clinical Impressions(s) / ED Diagnoses   Final diagnoses:  Epistaxis  Dysuria    ED Discharge Orders    None       Daleen Bo, MD 01/03/19 1342

## 2019-01-03 NOTE — ED Triage Notes (Signed)
Pt reports 3 nosebleeds in the last ten days. Pt reports that she does take a blood thinner for her pmh of atrial fibrillation. EMS reports given 2 squirts of afrin to the right side of her nose and has not had any bleeding since. Pt is alert and orientedx4 at present denying any pain. Pt also reports she has had dark, tarry stools for the past few months. Pt reports that her nosebleed has been on the right side when it bleeds.

## 2019-01-12 ENCOUNTER — Telehealth (INDEPENDENT_AMBULATORY_CARE_PROVIDER_SITE_OTHER): Payer: Medicare HMO | Admitting: Family Medicine

## 2019-01-12 ENCOUNTER — Encounter: Payer: Self-pay | Admitting: Family Medicine

## 2019-01-12 VITALS — BP 130/67 | HR 60 | Temp 97.2°F | Ht 63.0 in | Wt 292.2 lb

## 2019-01-12 DIAGNOSIS — Z7901 Long term (current) use of anticoagulants: Secondary | ICD-10-CM

## 2019-01-12 DIAGNOSIS — R040 Epistaxis: Secondary | ICD-10-CM

## 2019-01-12 DIAGNOSIS — I4891 Unspecified atrial fibrillation: Secondary | ICD-10-CM | POA: Diagnosis not present

## 2019-01-12 DIAGNOSIS — I10 Essential (primary) hypertension: Secondary | ICD-10-CM

## 2019-01-12 DIAGNOSIS — Z8711 Personal history of peptic ulcer disease: Secondary | ICD-10-CM

## 2019-01-12 DIAGNOSIS — Z8719 Personal history of other diseases of the digestive system: Secondary | ICD-10-CM

## 2019-01-12 DIAGNOSIS — R195 Other fecal abnormalities: Secondary | ICD-10-CM | POA: Diagnosis not present

## 2019-01-12 DIAGNOSIS — R300 Dysuria: Secondary | ICD-10-CM

## 2019-01-12 NOTE — Progress Notes (Signed)
Virtual Visit via Video Note  I connected with Charlotte [name] on 01/12/19 at  2:00 PM EDT by a video enabled telemedicine application and verified that I am speaking with the correct person using two identifiers.   I discussed the limitations of evaluation and management by telemedicine and the availability of in person appointments. The patient expressed understanding and agreed to proceed.  Attempted virtual visit but she was having problems getting the App to load.      Subjective:    CC: Nose bleed. F/U from ED visit.   HPI:  On 01/03/2019 pt was seen at [facility] ED she reports that her nose was stopped up and she noticed that her nose was bleeding and she sat up with a towel. By 10 AM she called EMS they recommended they go to the hospital because the bleeding wouldn't stop and this went into her mouth. They had to call the EMS again they took her to the hospital they sprayed some affrin which stopped the bleeding. She was d/c'd and told to use the affrin and vaseline for the next 2-3 days and so far its doing well. Her BP was very elevated while there.    She is on Xarelto.    No more problems w/dysuria. Her UA was negative in the ED and she was not placed on any antibiotic.   She had a positive stool guaiac in the ED.  But she still is experiencing dark stools which also has gotten better her stools have become more stolid.   Denies any f/s/c/n/v/d.(did have some diarrhea a few days ago).   She wanted to know about doing the Keto diet with the medications that she is currently taking.  She has had a couple of times where she had a hard time swallowing and had to go to the bathroom and was able to clear out some thick mucous. No blood in the mucous. No major congestion.  She has been staying in.    Hypertension- Pt denies chest pain, SOB, dizziness, or heart palpitations.  Taking meds as directed w/o problems.  Denies medication side effects.      Past medical history, Surgical  history, Family history not pertinant except as noted below, Social history, Allergies, and medications have been entered into the medical record, reviewed, and corrections made.   Review of Systems: No fevers, chills, night sweats, weight loss, chest pain, or shortness of breath.   Objective:    General: Speaking clearly in complete sentences without any shortness of breath.  Alert and oriented x3.  Normal judgment. No apparent acute distress.    Impression and Recommendations:   Nose bleed - seem to have resolved. Told to her keep some Afrin at home.  Keep nasal passage moisturized.  Can use a humidifier.    Dysuria - Symptoms resolved.  Guaiac positive stool - she says her stools since to have looked more normal this week. It was dark and tarry stools that was about a week.  Has noticed this better when first  Went on Amiodarone. If happens again she will let me know.  Has had hx of bleeding ulcer in 2018.   We discussed starting a PPI if symptoms start gain.    HTN - Well controlled. Continue current regimen. Follow up in  3-4 months.    Afib - she is doing well. She is taking her Xarelto.    I discussed the assessment and treatment plan with the patient. The patient was provided  an opportunity to ask questions and all were answered. The patient agreed with the plan and demonstrated an understanding of the instructions.   The patient was advised to call back or seek an in-person evaluation if the symptoms worsen or if the condition fails to improve as anticipated.  I provided 21 minutes of non-face-to-face time during this encounter.   [name], MD

## 2019-01-12 NOTE — Progress Notes (Signed)
On 01/03/2019 pt was seen at [facility] ED she reports that her nose was stopped up and she noticed that her nose was bleeding and she sat up with a towel. By 10 AM she called EMS they recommended they go to the hospital because the bleeding wouldn't stop and this went into her mouth. They had to call the EMS again they took her to the hospital they sprayed some affrin which stopped the bleeding. She was d/c'd and told to use the affrin and vaseline for the next 2-3 days and so far its doing well.   No more problems w/dysuria. But she still is experiencing dark stools which also has gotten better her stools have become more stolid.   Denies any f/s/c/n/v/d.(did have some diarrhea a few days ago).   She wanted to know about doing the Keto diet with the medications that she is currently taking.Heath Gold, CMA

## 2019-01-27 ENCOUNTER — Other Ambulatory Visit: Payer: Self-pay | Admitting: *Deleted

## 2019-01-27 MED ORDER — FUROSEMIDE 20 MG PO TABS: 20.0000 mg | ORAL_TABLET | Freq: Every day | ORAL | 0 refills | Status: DC

## 2019-02-03 ENCOUNTER — Telehealth: Payer: Self-pay | Admitting: Family Medicine

## 2019-02-03 MED ORDER — PREGABALIN 100 MG PO CAPS: 100.0000 mg | ORAL_CAPSULE | Freq: Two times a day (BID) | ORAL | 1 refills | Status: DC

## 2019-02-03 NOTE — Telephone Encounter (Signed)
Lyrica sent to pharmacy.  This will replace gabapentin.  Take Lyrica twice daily.

## 2019-02-03 NOTE — Telephone Encounter (Signed)
Pt called and stated she is willing to try the Lyrica that  Dr.Corey recommended. She also has question about if she starts the lyrica, does she stop the gabapentin.. Will forward to provider.

## 2019-02-03 NOTE — Telephone Encounter (Signed)
Pt does not want this to her local pharmacy because it is too expensive. Pt is going to enroll in a program called [employer], and they will contact us for Rx.   Pt advised to when she gets the Rx to use Lyrica instead of gabapentin, verbalized understanding.

## 2019-03-04 ENCOUNTER — Telehealth: Payer: Self-pay | Admitting: Family Medicine

## 2019-03-04 MED ORDER — PREGABALIN 100 MG PO CAPS: 100.0000 mg | ORAL_CAPSULE | Freq: Two times a day (BID) | ORAL | 3 refills | Status: DC

## 2019-03-04 NOTE — Telephone Encounter (Signed)
Received form regarding patient assistance for Lyrica.  Form filled out prescription printed per instructions and will mail back form as well as hard copy of prescription to patient.

## 2019-03-19 ENCOUNTER — Other Ambulatory Visit: Payer: Self-pay

## 2019-03-19 ENCOUNTER — Ambulatory Visit (HOSPITAL_COMMUNITY)
Admission: RE | Admit: 2019-03-19 | Discharge: 2019-03-19 | Disposition: A | Discharge status: Home / Self Care | Payer: Medicare HMO | Source: Ambulatory Visit | Attending: Nurse Practitioner | Admitting: Nurse Practitioner

## 2019-03-19 ENCOUNTER — Encounter (HOSPITAL_COMMUNITY): Payer: Self-pay | Admitting: Nurse Practitioner

## 2019-03-19 DIAGNOSIS — K219 Gastro-esophageal reflux disease without esophagitis: Secondary | ICD-10-CM | POA: Insufficient documentation

## 2019-03-19 DIAGNOSIS — I110 Hypertensive heart disease with heart failure: Secondary | ICD-10-CM | POA: Insufficient documentation

## 2019-03-19 DIAGNOSIS — I4819 Other persistent atrial fibrillation: Secondary | ICD-10-CM | POA: Diagnosis not present

## 2019-03-19 DIAGNOSIS — I4891 Unspecified atrial fibrillation: Secondary | ICD-10-CM

## 2019-03-19 DIAGNOSIS — Z79899 Other long term (current) drug therapy: Secondary | ICD-10-CM | POA: Diagnosis not present

## 2019-03-19 DIAGNOSIS — Z833 Family history of diabetes mellitus: Secondary | ICD-10-CM | POA: Insufficient documentation

## 2019-03-19 DIAGNOSIS — E785 Hyperlipidemia, unspecified: Secondary | ICD-10-CM | POA: Diagnosis not present

## 2019-03-19 DIAGNOSIS — Z888 Allergy status to other drugs, medicaments and biological substances status: Secondary | ICD-10-CM | POA: Insufficient documentation

## 2019-03-19 DIAGNOSIS — Z87891 Personal history of nicotine dependence: Secondary | ICD-10-CM | POA: Insufficient documentation

## 2019-03-19 DIAGNOSIS — Z7901 Long term (current) use of anticoagulants: Secondary | ICD-10-CM | POA: Insufficient documentation

## 2019-03-19 DIAGNOSIS — Z820 Family history of epilepsy and other diseases of the nervous system: Secondary | ICD-10-CM | POA: Insufficient documentation

## 2019-03-19 DIAGNOSIS — E119 Type 2 diabetes mellitus without complications: Secondary | ICD-10-CM | POA: Insufficient documentation

## 2019-03-19 DIAGNOSIS — I2510 Atherosclerotic heart disease of native coronary artery without angina pectoris: Secondary | ICD-10-CM | POA: Insufficient documentation

## 2019-03-19 DIAGNOSIS — Z800 Family history of malignant neoplasm of digestive organs: Secondary | ICD-10-CM | POA: Insufficient documentation

## 2019-03-19 DIAGNOSIS — Z8249 Family history of ischemic heart disease and other diseases of the circulatory system: Secondary | ICD-10-CM | POA: Diagnosis not present

## 2019-03-19 DIAGNOSIS — I5022 Chronic systolic (congestive) heart failure: Secondary | ICD-10-CM | POA: Insufficient documentation

## 2019-03-19 DIAGNOSIS — I480 Paroxysmal atrial fibrillation: Secondary | ICD-10-CM | POA: Diagnosis present

## 2019-03-19 LAB — COMPREHENSIVE METABOLIC PANEL
ALT: 17 U/L (ref 0–44)
AST: 20 U/L (ref 15–41)
Albumin: 3.9 g/dL (ref 3.5–5.0)
Alkaline Phosphatase: 73 U/L (ref 38–126)
Anion gap: 9 (ref 5–15)
BUN: 21 mg/dL (ref 8–23)
CO2: 23 mmol/L (ref 22–32)
Calcium: 9.4 mg/dL (ref 8.9–10.3)
Chloride: 107 mmol/L (ref 98–111)
Creatinine, Ser: 0.94 mg/dL (ref 0.44–1.00)
GFR calc Af Amer: >60 mL/min (ref >60)
GFR calc non Af Amer: 57 mL/min — ABNORMAL LOW (ref >60)
Glucose, Bld: 119 mg/dL — ABNORMAL HIGH (ref 70–99)
Potassium: 3.7 mmol/L (ref 3.5–5.1)
Sodium: 139 mmol/L (ref 135–145)
Total Bilirubin: 0.6 mg/dL (ref 0.3–1.2)
Total Protein: 7.2 g/dL (ref 6.5–8.1)

## 2019-03-19 LAB — CBC
HCT: 39.4 % (ref 36.0–46.0)
Hemoglobin: 12.7 g/dL (ref 12.0–15.0)
MCH: 30.7 pg (ref 26.0–34.0)
MCHC: 32.2 g/dL (ref 30.0–36.0)
MCV: 95.2 fL (ref 80.0–100.0)
Platelets: 206 10*3/uL (ref 150–400)
RBC: 4.14 MIL/uL (ref 3.87–5.11)
RDW: 12.7 % (ref 11.5–15.5)
WBC: 5.5 10*3/uL (ref 4.0–10.5)
nRBC: 0 % (ref 0.0–0.2)

## 2019-03-19 LAB — TSH: TSH: 1.741 u[IU]/mL (ref 0.350–4.500)

## 2019-03-19 NOTE — Patient Instructions (Addendum)
Increase lasix to 20 mg twice a day for 1 week. °

## 2019-03-19 NOTE — Progress Notes (Signed)
Primary Care Physician: [name], MD Referring Physician: Dr. Levander Campion Charlotte [name] is a 83 y.o. female  with h/oPAF, nonobstructive CAD, chronic systolic heart failure with normalization of EF, HTN, HLD, and DM II. She was hospitalized twice in August,2018, first for GI bleeding and then for lumbar spine compression fracture. Cardiology F/U EKG obtained on 07/12/2017 showed atrial fibrillation, Sheunderwent successful DC cardioversion on 07/16/2017 after 3 shocks, one at 150 J and twice at 200 J. She was seen by Cecilie Kicks, NP 05/2018 and was found back in afib and discussed with Dr. Johnsie Cancel. She was set up for echo and stress test.. She was seen in  afib clinic to further discuss antiarrythmic therapy. She is tired and short of breath with afib.  Pt decided to start amiodarone load with a f/u cardioversion. She is now one week s/p successful cardioversion, 9/30 and continues in SR. She feels improved with more energy, mores shortness of breath. Her BP is elevated today and she is asking for low salt recommendations as she is not very careful with salt in her diet.. She has also noted some ankle swelling.  F/u in afib clinic,11/27. She   wanted to  discuss some rt leg weakness that she has been having.  She  read that amiodarone can cause muscle weakness and further discuss if coming from  amiodarone. She apparently has significant back problems and has been undergoing physical therapy but recently stopped it as she felt like it was not helping. She is finding it harder to ambulate from  the pain and weakness in her rt leg.She states that she still feels better overall in SR, shortness of breath improved but has chronic shortness of breath.   F/u in afib clinic 6/11. She asked to be seen for complaints that she is wondering if caused by amiodarone. She c/o of h/a often, watery eyes and blurry vision, thinning hair and weight gain. Her weight is up 10 lbs since April. She has large legs  so it is hard to assess degree of edema.She is in SR and has not noted any afib.  Today, she denies symptoms of palpitations, chest pain, shortness of breath, orthopnea, PND, lower extremity edema, dizziness, presyncope, syncope, or neurologic sequela. The patient is tolerating medications without difficulties and is otherwise without complaint today.   Past Medical History:  Diagnosis Date  . Aortic insufficiency   . Atrial fibrillation ([place])    a. s/p DCCV 03/2013  . CAD (coronary artery disease)    LHC 06/2010: EF 55%, mild plaque in the LAD 20-30%, otherwise normal coronary arteries  . CATARACTS   . Chronic systolic heart failure ([place])    in setting of AFib;  Echocardiogram 02/2013: Moderate LVH, EF 25-30%, anteroseptal and apical HK, moderate AI, MAC, moderate MR, moderate LAE, mild RAE, PASP 32 => after DCCV f/u Echo 8/14: Moderate LVH, EF 60-65%, normal wall motion, grade 1 diastolic dysfunction, moderate AI, moderate LAE  . Complication of anesthesia 2009   nausea and vomitting  . GERD (gastroesophageal reflux disease)   . HYPERLIPIDEMIA, MILD   . HYPERTENSION, BENIGN SYSTEMIC   . Irritable bowel syndrome   . LBBB (left bundle branch block)   . MEDIAL EPICONDYLITIS   . NSVT (nonsustained ventricular tachycardia) ([place])    during Dob Echo 2011 => normal cath  . OSTEOARTHRITIS   . OSTEOPENIA   . PONV (postoperative nausea and vomiting)   . Pre-diabetes   . RHINITIS, ALLERGIC   .  SYNCOPE   . VITAMIN D DEFICIENCY    Past Surgical History:  Procedure Laterality Date  . CARDIOVERSION N/A 04/01/2013   Procedure: CARDIOVERSION;  Surgeon: Josue Hector, MD;  Location: [facility] ENDOSCOPY;  Service: Cardiovascular;  Laterality: N/A;  . CARDIOVERSION N/A 07/16/2017   Procedure: CARDIOVERSION;  Surgeon: Pixie Casino, MD;  Location: [facility] ENDOSCOPY;  Service: Cardiovascular;  Laterality: N/A;  . CARDIOVERSION N/A 07/07/2018   Procedure: CARDIOVERSION;  Surgeon: Pixie Casino, MD;  Location:  [place];  Service: Cardiovascular;  Laterality: N/A;  . CATARACT EXTRACTION, BILATERAL  2012   Dr. Herbert Deaner  . COLONOSCOPY WITH PROPOFOL N/A 05/23/2017   Procedure: COLONOSCOPY WITH PROPOFOL;  Surgeon: Wonda Horner, MD;  Location: [facility] ENDOSCOPY;  Service: Endoscopy;  Laterality: N/A;  . ESOPHAGOGASTRODUODENOSCOPY (EGD) WITH PROPOFOL N/A 05/23/2017   Procedure: ESOPHAGOGASTRODUODENOSCOPY (EGD) WITH PROPOFOL;  Surgeon: Wonda Horner, MD;  Location: [facility] ENDOSCOPY;  Service: Endoscopy;  Laterality: N/A;  . IR KYPHO LUMBAR INC FX REDUCE BONE BX UNI/BIL CANNULATION INC/IMAGING  06/06/2017  . IR RADIOLOGIST EVAL & MGMT  07/01/2017  . RADICAL HYSTERECTOMY    . REPLACEMENT TOTAL KNEE     11/03/07 right....... 03/29/08 left    Current Outpatient Medications  Medication Sig Dispense Refill  . acetaminophen (TYLENOL) 500 MG tablet Take 1,000 mg by mouth 3 (three) times daily.    Marland Kitchen amiodarone (PACERONE) 200 MG tablet Take 0.5 tablets (100 mg total) by mouth daily. 45 tablet 2  . carvedilol (COREG) 12.5 MG tablet Take 1 tablet (12.5 mg total) by mouth 2 (two) times daily. 180 tablet 3  . furosemide (LASIX) 20 MG tablet Take 1 tablet (20 mg total) by mouth daily. 90 tablet 0  . losartan (COZAAR) 25 MG tablet Take 1 tablet (25 mg total) by mouth daily. 90 tablet 3  . polyvinyl alcohol-povidone (REFRESH) 1.4-0.6 % ophthalmic solution Place 1-2 drops into both eyes daily as needed (dry eyes).     . potassium chloride (K-DUR,KLOR-CON) 10 MEQ tablet Take 1 tablet (10 mEq total) by mouth 2 (two) times daily. 180 tablet 3  . pregabalin (LYRICA) 100 MG capsule Take 1 capsule (100 mg total) by mouth 2 (two) times daily. 60 capsule 3  . Psyllium (METAMUCIL PO) Take 5 mLs by mouth at bedtime as needed (constipation). Mix in 8 oz liquid and drink     . rivaroxaban (XARELTO) 20 MG TABS tablet Take 1 tablet (20 mg total) by mouth daily with supper. 90 tablet 2   No current facility-administered medications for this  encounter.     Allergies  Allergen Reactions  . Metformin And Related Other (See Comments)    Renal failure  . Amlodipine Other (See Comments)    HEADACHE    Social History   Socioeconomic History  . Marital status: Divorced    Spouse name: Not on file  . Number of children: 1  . Years of education: Not on file  . Highest education level: Not on file  Occupational History  . Occupation: Retired    Fish farm manager: RETIRED  Social Needs  . Financial resource strain: Not on file  . Food insecurity    Worry: Not on file    Inability: Not on file  . Transportation needs    Medical: Not on file    Non-medical: Not on file  Tobacco Use  . Smoking status: Former Smoker    Quit date: 10/08/1970    Years since quitting: 48.4  . Smokeless tobacco:  Never Used  Substance and Sexual Activity  . Alcohol use: No  . Drug use: No  . Sexual activity: Not on file  Lifestyle  . Physical activity    Days per week: Not on file    Minutes per session: Not on file  . Stress: Not on file  Relationships  . Social Herbalist on phone: Not on file    Gets together: Not on file    Attends religious service: Not on file    Active member of club or organization: Not on file    Attends meetings of clubs or organizations: Not on file    Relationship status: Not on file  . Intimate partner violence    Fear of current or ex partner: Not on file    Emotionally abused: Not on file    Physically abused: Not on file    Forced sexual activity: Not on file  Other Topics Concern  . Not on file  Social History Narrative  . Not on file    Family History  Problem Relation Age of Onset  . Arrhythmia Mother        afib  . Alzheimer's disease Mother   . Colon cancer Father   . Heart attack Father 17  . Diabetes Sister   . Arrhythmia Sister        afib    ROS- All systems are reviewed and negative except as per the HPI above  Physical Exam: Vitals:   03/19/19 1543  BP: (!) 148/82   Pulse: 69  Weight: (!) 137.1 kg  Height: _0  (1.6 m)   Wt Readings from Last 3 Encounters:  03/19/19 (!) 137.1 kg  01/12/19 132.5 kg  01/03/19 121.6 kg    Labs: Lab Results  Component Value Date   NA 136 01/03/2019   K 3.6 01/03/2019   CL 103 01/03/2019   CO2 26 01/03/2019   GLUCOSE 123 (H) 01/03/2019   BUN 27 (H) 01/03/2019   CREATININE 0.92 01/03/2019   CALCIUM 8.9 01/03/2019   PHOS 4.2 06/02/2017   MG 1.9 07/12/2017   Lab Results  Component Value Date   INR 1.01 06/06/2017   Lab Results  Component Value Date   CHOL 193 03/01/2017   HDL 54 03/01/2017   LDLCALC 124 (H) 03/01/2017   TRIG 76 03/01/2017     GEN- The patient is well appearing, alert and oriented x 3 today.   Head- normocephalic, atraumatic Eyes-  Sclera clear, conjunctiva pink Ears- hearing intact Oropharynx- clear Neck- supple, no JVP Lymph- no cervical lymphadenopathy Lungs- Clear to ausculation bilaterally, normal work of breathing Heart-   regular rate and rhythm, no murmurs, rubs or gallops, PMI not laterally displaced GI- soft, NT, ND, + BS Extremities- no clubbing, cyanosis, + edema MS- no significant deformity or atrophy Skin- no rash or lesion Psych- euthymic mood, full affect Neuro- strength and sensation are intact  EKG-SR with first degree AV block, LBBB , LAD  pr int 214 ms, qrs int 148 ms, qtc 460 ms Echo-Study Conclusions  - Left ventricle: The cavity size was normal. There was mild focal   basal hypertrophy of the septum. Systolic function was mildly   reduced. The estimated ejection fraction was in the range of 45%   to 50%. Wall motion was normal; there were no regional wall   motion abnormalities. - Aortic valve: Transvalvular velocity was within the normal range.   There was no stenosis. There was mild  to moderate regurgitation. - Aorta: Ascending aortic diameter: 37.43 mm (S). - Ascending aorta: The ascending aorta was mildly dilated. - Mitral valve: Transvalvular  velocity was within the normal range.   There was no evidence for stenosis. There was mild regurgitation. - Left atrium: The atrium was mildly dilated. - Right ventricle: The cavity size was normal. Wall thickness was   normal. Systolic function was normal. - Atrial septum: No defect or patent foramen ovale was identified   by color flow Doppler. - Tricuspid valve: There was mild regurgitation. - Pulmonary arteries: Systolic pressure was within the normal   range. PA peak pressure: 33 mm Hg (S).  Impressions:  - Compared with the echo 06/6758, systolic function is reduced.  Stress test 8/15- Notes recorded by Jerline Pain, MD on 05/22/2018 at 4:38 PM EDT 1. EF 48%, septal hypokinesis.  2. Fixed small, mild mid to apical anteroseptal perfusion defect.   The septal hypokinesis and the fixed septal defect are due to LBBB. There is no ischemia. Low risk study.   Candee Furbish, MD   Assessment and Plan: 1. Persistent  afib Maintaining  SR with amiodarone 200 mg daily Has many varied symptoms but am concerned thyroid could be abnormal 2/2 to amio, driving symptoms She has had weight gain of at least 10 lbs, not sure if fluid or true weight Will increase lasix 20 mg bid x 7 days, she will take an extra 10 mcq of K+ while on extra lasix Continue xarelto 20 mg daily with a CHA2DS2VASc score of at least 6 Cmet/tsh/cbc  today  2. htn  stable  F/u with Dr. Johnsie Cancel per recall  afib clinic  In 2 weeks  Butch Penny C. Zeina Akkerman, [facility] [address] [place], [place] [address] [phone]

## 2019-04-02 ENCOUNTER — Other Ambulatory Visit: Payer: Self-pay

## 2019-04-02 ENCOUNTER — Ambulatory Visit (HOSPITAL_COMMUNITY)
Admission: RE | Admit: 2019-04-02 | Discharge: 2019-04-02 | Disposition: A | Discharge status: Home / Self Care | Payer: Medicare HMO | Source: Ambulatory Visit | Attending: Nurse Practitioner | Admitting: Nurse Practitioner

## 2019-04-02 ENCOUNTER — Encounter (HOSPITAL_COMMUNITY): Payer: Self-pay | Admitting: Nurse Practitioner

## 2019-04-02 VITALS — BP 146/82 | HR 64 | Ht 63.0 in | Wt 300.0 lb

## 2019-04-02 DIAGNOSIS — I2510 Atherosclerotic heart disease of native coronary artery without angina pectoris: Secondary | ICD-10-CM | POA: Diagnosis not present

## 2019-04-02 DIAGNOSIS — Z79899 Other long term (current) drug therapy: Secondary | ICD-10-CM | POA: Diagnosis not present

## 2019-04-02 DIAGNOSIS — Z833 Family history of diabetes mellitus: Secondary | ICD-10-CM | POA: Insufficient documentation

## 2019-04-02 DIAGNOSIS — E785 Hyperlipidemia, unspecified: Secondary | ICD-10-CM | POA: Insufficient documentation

## 2019-04-02 DIAGNOSIS — E119 Type 2 diabetes mellitus without complications: Secondary | ICD-10-CM | POA: Insufficient documentation

## 2019-04-02 DIAGNOSIS — I110 Hypertensive heart disease with heart failure: Secondary | ICD-10-CM | POA: Insufficient documentation

## 2019-04-02 DIAGNOSIS — K219 Gastro-esophageal reflux disease without esophagitis: Secondary | ICD-10-CM | POA: Diagnosis not present

## 2019-04-02 DIAGNOSIS — I447 Left bundle-branch block, unspecified: Secondary | ICD-10-CM | POA: Diagnosis not present

## 2019-04-02 DIAGNOSIS — Z87891 Personal history of nicotine dependence: Secondary | ICD-10-CM | POA: Diagnosis not present

## 2019-04-02 DIAGNOSIS — I4819 Other persistent atrial fibrillation: Secondary | ICD-10-CM | POA: Insufficient documentation

## 2019-04-02 DIAGNOSIS — Z7901 Long term (current) use of anticoagulants: Secondary | ICD-10-CM | POA: Insufficient documentation

## 2019-04-02 DIAGNOSIS — I5022 Chronic systolic (congestive) heart failure: Secondary | ICD-10-CM | POA: Diagnosis not present

## 2019-04-02 DIAGNOSIS — Z8249 Family history of ischemic heart disease and other diseases of the circulatory system: Secondary | ICD-10-CM | POA: Diagnosis not present

## 2019-04-02 MED ORDER — FUROSEMIDE 20 MG PO TABS: 20.0000 mg | ORAL_TABLET | Freq: Two times a day (BID) | ORAL | 1 refills | Status: DC

## 2019-04-02 NOTE — Progress Notes (Signed)
Primary Care Physician: [name], MD Referring Physician: Dr. Levander Campion Brobeck is a 83 y.o. female  with h/oPAF, nonobstructive CAD, chronic systolic heart failure with normalization of EF, HTN, HLD, and DM II. She was hospitalized twice in August,2018, first for GI bleeding and then for lumbar spine compression fracture. Cardiology F/U EKG obtained on 07/12/2017 showed atrial fibrillation, Sheunderwent successful DC cardioversion on 07/16/2017 after 3 shocks, one at 150 J and twice at 200 J. She was seen by Cecilie Kicks, NP 05/2018 and was found back in afib and discussed with Dr. Johnsie Cancel. She was set up for echo and stress test.. She was seen in  afib clinic to further discuss antiarrythmic therapy. She is tired and short of breath with afib.  Pt decided to start amiodarone load with a f/u cardioversion. She is now one week s/p successful cardioversion, 9/30 and continues in SR. She feels improved with more energy, mores shortness of breath. Her BP is elevated today and she is asking for low salt recommendations as she is not very careful with salt in her diet.. She has also noted some ankle swelling.  F/u in afib clinic,11/27. She   wanted to  discuss some rt leg weakness that she has been having.  She  read that amiodarone can cause muscle weakness and further discuss if coming from  amiodarone. She apparently has significant back problems and has been undergoing physical therapy but recently stopped it as she felt like it was not helping. She is finding it harder to ambulate from  the pain and weakness in her rt leg.She states that she still feels better overall in SR, shortness of breath improved but has chronic shortness of breath.   F/u in afib clinic 6/11. She asked to be seen for complaints that she is wondering if caused by amiodarone. She c/o of h/a often, watery eyes and blurry vision, thinning hair and weight gain. Her weight is up 10 lbs since April. She has large legs  so it is hard to assess degree of edema.She is in SR and has not noted any afib.  F/u in fib clinic 6/25.On last visit, she had many  complaints and was asking if it might be her amio. Labs were done and showed normal thyroid and liver enzymes. She did double up on lasix and while she was on 20 mg bid, she did lose around 5-6 lbs with less ankle swelling and felt better.  Back on  20 mg daily her weight has returned. She was started on Lyrical and has also felt improved , much better sleep.   Today, she denies symptoms of palpitations, chest pain, shortness of breath, orthopnea, PND, lower extremity edema, dizziness, presyncope, syncope, or neurologic sequela. The patient is tolerating medications without difficulties and is otherwise without complaint today.   Past Medical History:  Diagnosis Date  . Aortic insufficiency   . Atrial fibrillation ([place])    a. s/p DCCV 03/2013  . CAD (coronary artery disease)    LHC 06/2010: EF 55%, mild plaque in the LAD 20-30%, otherwise normal coronary arteries  . CATARACTS   . Chronic systolic heart failure ([place])    in setting of AFib;  Echocardiogram 02/2013: Moderate LVH, EF 25-30%, anteroseptal and apical HK, moderate AI, MAC, moderate MR, moderate LAE, mild RAE, PASP 32 => after DCCV f/u Echo 8/14: Moderate LVH, EF 60-65%, normal wall motion, grade 1 diastolic dysfunction, moderate AI, moderate LAE  . Complication of  anesthesia 2009   nausea and vomitting  . GERD (gastroesophageal reflux disease)   . HYPERLIPIDEMIA, MILD   . HYPERTENSION, BENIGN SYSTEMIC   . Irritable bowel syndrome   . LBBB (left bundle branch block)   . MEDIAL EPICONDYLITIS   . NSVT (nonsustained ventricular tachycardia) ([place])    during Dob Echo 2011 => normal cath  . OSTEOARTHRITIS   . OSTEOPENIA   . PONV (postoperative nausea and vomiting)   . Pre-diabetes   . RHINITIS, ALLERGIC   . SYNCOPE   . VITAMIN D DEFICIENCY    Past Surgical History:  Procedure Laterality Date  .  CARDIOVERSION N/A 04/01/2013   Procedure: CARDIOVERSION;  Surgeon: Josue Hector, MD;  Location: [facility] ENDOSCOPY;  Service: Cardiovascular;  Laterality: N/A;  . CARDIOVERSION N/A 07/16/2017   Procedure: CARDIOVERSION;  Surgeon: Pixie Casino, MD;  Location[facility] ENDOSCOPY;  Service: Cardiovascular;  Laterality: N/A;  . CARDIOVERSION N/A 07/07/2018   Procedure: CARDIOVERSION;  Surgeon: Pixie Casino, MD;  Location: [place];  Service: Cardiovascular;  Laterality: N/A;  . CATARACT EXTRACTION, BILATERAL  2012   Dr. Herbert Deaner  . COLONOSCOPY WITH PROPOFOL N/A 05/23/2017   Procedure: COLONOSCOPY WITH PROPOFOL;  Surgeon: Wonda Horner, MD;  [facility];  Service: Endoscopy;  Laterality: N/A;  . ESOPHAGOGASTRODUODENOSCOPY (EGD) WITH PROPOFOL N/A 05/23/2017   Procedure: ESOPHAGOGASTRODUODENOSCOPY (EGD) WITH PROPOFOL;  Surgeon: Wonda Horner, MD;  Location: [facility] ENDOSCOPY;  Service: Endoscopy;  Laterality: N/A;  . IR KYPHO LUMBAR INC FX REDUCE BONE BX UNI/BIL CANNULATION INC/IMAGING  06/06/2017  . IR RADIOLOGIST EVAL & MGMT  07/01/2017  . RADICAL HYSTERECTOMY    . REPLACEMENT TOTAL KNEE     11/03/07 right....... 03/29/08 left    Current Outpatient Medications  Medication Sig Dispense Refill  . acetaminophen (TYLENOL) 500 MG tablet Take 1,000 mg by mouth 3 (three) times daily.    Marland Kitchen amiodarone (PACERONE) 200 MG tablet Take 0.5 tablets (100 mg total) by mouth daily. 45 tablet 2  . carvedilol (COREG) 12.5 MG tablet Take 1 tablet (12.5 mg total) by mouth 2 (two) times daily. 180 tablet 3  . furosemide (LASIX) 20 MG tablet Take 1 tablet (20 mg total) by mouth daily. 90 tablet 0  . losartan (COZAAR) 25 MG tablet Take 1 tablet (25 mg total) by mouth daily. 90 tablet 3  . polyvinyl alcohol-povidone (REFRESH) 1.4-0.6 % ophthalmic solution Place 1-2 drops into both eyes daily as needed (dry eyes).     . potassium chloride (K-DUR,KLOR-CON) 10 MEQ tablet Take 1 tablet (10 mEq total) by mouth 2 (two) times  daily. 180 tablet 3  . pregabalin (LYRICA) 100 MG capsule Take 1 capsule (100 mg total) by mouth 2 (two) times daily. 60 capsule 3  . Psyllium (METAMUCIL PO) Take 5 mLs by mouth at bedtime as needed (constipation). Mix in 8 oz liquid and drink     . rivaroxaban (XARELTO) 20 MG TABS tablet Take 1 tablet (20 mg total) by mouth daily with supper. 90 tablet 2   No current facility-administered medications for this encounter.     Allergies  Allergen Reactions  . Metformin And Related Other (See Comments)    Renal failure  . Amlodipine Other (See Comments)    HEADACHE    Social History   Socioeconomic History  . Marital status: Divorced    Spouse name: Not on file  . Number of children: 1  . Years of education: Not on file  . Highest education level:  Not on file  Occupational History  . Occupation: Retired    Fish farm manager: RETIRED  Social Needs  . Financial resource strain: Not on file  . Food insecurity    Worry: Not on file    Inability: Not on file  . Transportation needs    Medical: Not on file    Non-medical: Not on file  Tobacco Use  . Smoking status: Former Smoker    Quit date: 10/08/1970    Years since quitting: 48.5  . Smokeless tobacco: Never Used  Substance and Sexual Activity  . Alcohol use: No  . Drug use: No  . Sexual activity: Not on file  Lifestyle  . Physical activity    Days per week: Not on file    Minutes per session: Not on file  . Stress: Not on file  Relationships  . Social Herbalist on phone: Not on file    Gets together: Not on file    Attends religious service: Not on file    Active member of club or organization: Not on file    Attends meetings of clubs or organizations: Not on file    Relationship status: Not on file  . Intimate partner violence    Fear of current or ex partner: Not on file    Emotionally abused: Not on file    Physically abused: Not on file    Forced sexual activity: Not on file  Other Topics Concern  . Not on  file  Social History Narrative  . Not on file    Family History  Problem Relation Age of Onset  . Arrhythmia Mother        afib  . Alzheimer's disease Mother   . Colon cancer Father   . Heart attack Father 33  . Diabetes Sister   . Arrhythmia Sister        afib    ROS- All systems are reviewed and negative except as per the HPI above  Physical Exam: There were no vitals filed for this visit. Wt Readings from Last 3 Encounters:  03/19/19 (!) 137.1 kg  01/12/19 132.5 kg  01/03/19 121.6 kg    Labs: Lab Results  Component Value Date   NA 139 03/19/2019   K 3.7 03/19/2019   CL 107 03/19/2019   CO2 23 03/19/2019   GLUCOSE 119 (H) 03/19/2019   BUN 21 03/19/2019   CREATININE 0.94 03/19/2019   CALCIUM 9.4 03/19/2019   PHOS 4.2 06/02/2017   MG 1.9 07/12/2017   Lab Results  Component Value Date   INR 1.01 06/06/2017   Lab Results  Component Value Date   CHOL 193 03/01/2017   HDL 54 03/01/2017   LDLCALC 124 (H) 03/01/2017   TRIG 76 03/01/2017     GEN- The patient is well appearing, alert and oriented x 3 today.   Head- normocephalic, atraumatic Eyes-  Sclera clear, conjunctiva pink Ears- hearing intact Oropharynx- clear Neck- supple, no JVP Lymph- no cervical lymphadenopathy Lungs- Clear to ausculation bilaterally, normal work of breathing Heart-   regular rate and rhythm, no murmurs, rubs or gallops, PMI not laterally displaced GI- soft, NT, ND, + BS Extremities- no clubbing, cyanosis, + edema MS- no significant deformity or atrophy Skin- no rash or lesion Psych- euthymic mood, full affect Neuro- strength and sensation are intact  EKG- NSR at 64 bpm, LBBB, pr int 198 ms, qrs int 148 ms, qtc 458 ms Echo-Study Conclusions  - Left ventricle: The cavity size  was normal. There was mild focal   basal hypertrophy of the septum. Systolic function was mildly   reduced. The estimated ejection fraction was in the range of 45%   to 50%. Wall motion was normal;  there were no regional wall   motion abnormalities. - Aortic valve: Transvalvular velocity was within the normal range.   There was no stenosis. There was mild to moderate regurgitation. - Aorta: Ascending aortic diameter: 37.43 mm (S). - Ascending aorta: The ascending aorta was mildly dilated. - Mitral valve: Transvalvular velocity was within the normal range.   There was no evidence for stenosis. There was mild regurgitation. - Left atrium: The atrium was mildly dilated. - Right ventricle: The cavity size was normal. Wall thickness was   normal. Systolic function was normal. - Atrial septum: No defect or patent foramen ovale was identified   by color flow Doppler. - Tricuspid valve: There was mild regurgitation. - Pulmonary arteries: Systolic pressure was within the normal   range. PA peak pressure: 33 mm Hg (S).  Impressions:  - Compared with the echo 0/9311, systolic function is reduced.  Stress test 8/15- Notes recorded by Jerline Pain, MD on 05/22/2018 at 4:38 PM EDT 1. EF 48%, septal hypokinesis.  2. Fixed small, mild mid to apical anteroseptal perfusion defect.   The septal hypokinesis and the fixed septal defect are due to LBBB. There is no ischemia. Low risk study.   Candee Furbish, MD   Assessment and Plan: 1. Persistent  afib Maintaining  SR with amiodarone 200 mg daily Had many varied symptoms but now improved with increased lasix and start of lyrica Lab results with normal thyroid. liver She felt better on 20 mg lasix bid so will return to this dose  She does have f/u with PCP 7/6 and will need bmet then to see if this dose can be continued  Continue xarelto 20 mg daily with a CHA2DS2VASc score of at least 6  I explained to pt anytime she wants to stop amiodarone, this can be done I am not sure how long she will maintain SR after stopping If afib returns and pt desires to restore SR, tikosyn would be her other option, she would have to allow for amio washout  tht could take 3-4 months At the time of amio start, she did not want the 4 day hospitalization or  cost of tikosyn She will think about it   2. htn  stable  F/u with Dr. Johnsie Cancel per recall  afib clinic  In  6 months PCP 7/6  Geroge Baseman. Graviela Nodal, [facility] [address] [place], [place] [address] [phone]

## 2019-04-02 NOTE — Patient Instructions (Signed)
Increase lasix 20mg  twice a day

## 2019-04-13 ENCOUNTER — Other Ambulatory Visit: Payer: Self-pay

## 2019-04-13 ENCOUNTER — Encounter: Payer: Self-pay | Admitting: Family Medicine

## 2019-04-13 ENCOUNTER — Ambulatory Visit (INDEPENDENT_AMBULATORY_CARE_PROVIDER_SITE_OTHER): Payer: Medicare HMO | Admitting: Family Medicine

## 2019-04-13 VITALS — BP 136/64 | HR 68 | Ht 63.0 in | Wt 309.0 lb

## 2019-04-13 DIAGNOSIS — I878 Other specified disorders of veins: Secondary | ICD-10-CM | POA: Diagnosis not present

## 2019-04-13 DIAGNOSIS — E1169 Type 2 diabetes mellitus with other specified complication: Secondary | ICD-10-CM | POA: Diagnosis not present

## 2019-04-13 DIAGNOSIS — E669 Obesity, unspecified: Secondary | ICD-10-CM

## 2019-04-13 DIAGNOSIS — I4891 Unspecified atrial fibrillation: Secondary | ICD-10-CM | POA: Diagnosis not present

## 2019-04-13 DIAGNOSIS — I10 Essential (primary) hypertension: Secondary | ICD-10-CM | POA: Diagnosis not present

## 2019-04-13 LAB — POCT GLYCOSYLATED HEMOGLOBIN (HGB A1C): Hemoglobin A1C: 5.8 % — AB (ref 4.0–5.6)

## 2019-04-13 MED ORDER — POTASSIUM CHLORIDE CRYS ER 10 MEQ PO TBCR: 10.0000 meq | EXTENDED_RELEASE_TABLET | Freq: Three times a day (TID) | ORAL | 3 refills | Status: DC

## 2019-04-13 NOTE — Progress Notes (Signed)
Established Patient Office Visit  Subjective:  Patient ID: Charlotte [name], female    DOB: Sep 15, 1936  Age: 83 y.o. MRN: [id number]  CC:  Chief Complaint  Patient presents with  . Diabetes   Here today with her daughter who is an Therapist, sports.  HPI Charlotte [name] presents for   Hypertension- Pt denies chest pain, SOB, dizziness, or heart palpitations.  Taking meds as directed w/o problems.  Denies medication side effects.   Diabetes - no hypoglycemic events. No wounds or sores that are not healing well. No increased thirst or urination. Checking glucose at home. Taking medications as prescribed without any side effects.  AFib - Since being on amiodarone she has had dry skin and insomnia, hair thinning and blurry vision.   She gets a lot of congestion at night.   She also wanted to discuss her frequent urination.  She says most nights she gets up about 4 times at night to urinate.  She says it always after about 15 minutes after she lays down.  This is been going on for quite some time.  Cardiology had also increased her Lasix to twice a day as well as increasing her potassium to 3 times a day.  They had recommended that we check a BMP at her follow-up today I am happy to do so.  She says it does seem to help with her swelling but it does not completely control it.  She was unable to [place] the compression stockings because of her back pain she is unable to get them on by herself.   Past Medical History:  Diagnosis Date  . Aortic insufficiency   . Atrial fibrillation ([place])    a. s/p DCCV 03/2013  . CAD (coronary artery disease)    LHC 06/2010: EF 55%, mild plaque in the LAD 20-30%, otherwise normal coronary arteries  . CATARACTS   . Chronic systolic heart failure ([place])    in setting of AFib;  Echocardiogram 02/2013: Moderate LVH, EF 25-30%, anteroseptal and apical HK, moderate AI, MAC, moderate MR, moderate LAE, mild RAE, PASP 32 => after DCCV f/u Echo 8/14: Moderate LVH, EF 60-65%, normal wall  motion, grade 1 diastolic dysfunction, moderate AI, moderate LAE  . Complication of anesthesia 2009   nausea and vomitting  . GERD (gastroesophageal reflux disease)   . HYPERLIPIDEMIA, MILD   . HYPERTENSION, BENIGN SYSTEMIC   . Irritable bowel syndrome   . LBBB (left bundle branch block)   . MEDIAL EPICONDYLITIS   . NSVT (nonsustained ventricular tachycardia) ([place])    during Dob Echo 2011 => normal cath  . OSTEOARTHRITIS   . OSTEOPENIA   . PONV (postoperative nausea and vomiting)   . Pre-diabetes   . RHINITIS, ALLERGIC   . SYNCOPE   . VITAMIN D DEFICIENCY     Past Surgical History:  Procedure Laterality Date  . CARDIOVERSION N/A 04/01/2013   Procedure: CARDIOVERSION;  Surgeon: Josue Hector, MD;  Location: [facility] ENDOSCOPY;  Service: Cardiovascular;  Laterality: N/A;  . CARDIOVERSION N/A 07/16/2017   Procedure: CARDIOVERSION;  Surgeon: Pixie Casino, MD;  Location: [facility] ENDOSCOPY;  Service: Cardiovascular;  Laterality: N/A;  . CARDIOVERSION N/A 07/07/2018   Procedure: CARDIOVERSION;  Surgeon: Pixie Casino, MD;  Location: [place];  Service: Cardiovascular;  Laterality: N/A;  . CATARACT EXTRACTION, BILATERAL  2012   Dr. Herbert Deaner  . COLONOSCOPY WITH PROPOFOL N/A 05/23/2017   Procedure: COLONOSCOPY WITH PROPOFOL;  Surgeon: Wonda Horner, MD;  Location: [facility] ENDOSCOPY;  Service: Endoscopy;  Laterality: N/A;  . ESOPHAGOGASTRODUODENOSCOPY (EGD) WITH PROPOFOL N/A 05/23/2017   Procedure: ESOPHAGOGASTRODUODENOSCOPY (EGD) WITH PROPOFOL;  Surgeon: Wonda Horner, MD;  Location: [facility] ENDOSCOPY;  Service: Endoscopy;  Laterality: N/A;  . IR KYPHO LUMBAR INC FX REDUCE BONE BX UNI/BIL CANNULATION INC/IMAGING  06/06/2017  . IR RADIOLOGIST EVAL & MGMT  07/01/2017  . RADICAL HYSTERECTOMY    . REPLACEMENT TOTAL KNEE     11/03/07 right....... 03/29/08 left    Family History  Problem Relation Age of Onset  . Arrhythmia Mother        afib  . Alzheimer's disease Mother   . Colon cancer Father   .  Heart attack Father 76  . Diabetes Sister   . Arrhythmia Sister        afib    Social History   Socioeconomic History  . Marital status: Divorced    Spouse name: Not on file  . Number of children: 1  . Years of education: Not on file  . Highest education level: Not on file  Occupational History  . Occupation: Retired    Fish farm manager: RETIRED  Social Needs  . Financial resource strain: Not on file  . Food insecurity    Worry: Not on file    Inability: Not on file  . Transportation needs    Medical: Not on file    Non-medical: Not on file  Tobacco Use  . Smoking status: Former Smoker    Quit date: 10/08/1970    Years since quitting: 48.5  . Smokeless tobacco: Never Used  Substance and Sexual Activity  . Alcohol use: No  . Drug use: No  . Sexual activity: Not on file  Lifestyle  . Physical activity    Days per week: Not on file    Minutes per session: Not on file  . Stress: Not on file  Relationships  . Social Herbalist on phone: Not on file    Gets together: Not on file    Attends religious service: Not on file    Active member of club or organization: Not on file    Attends meetings of clubs or organizations: Not on file    Relationship status: Not on file  . Intimate partner violence    Fear of current or ex partner: Not on file    Emotionally abused: Not on file    Physically abused: Not on file    Forced sexual activity: Not on file  Other Topics Concern  . Not on file  Social History Narrative  . Not on file    Outpatient Medications Prior to Visit  Medication Sig Dispense Refill  . acetaminophen (TYLENOL) 500 MG tablet Take 1,000 mg by mouth 3 (three) times daily.    Marland Kitchen amiodarone (PACERONE) 200 MG tablet Take 0.5 tablets (100 mg total) by mouth daily. 45 tablet 2  . carvedilol (COREG) 12.5 MG tablet Take 1 tablet (12.5 mg total) by mouth 2 (two) times daily. 180 tablet 3  . furosemide (LASIX) 20 MG tablet Take 1 tablet (20 mg total) by mouth 2  (two) times daily. 180 tablet 1  . losartan (COZAAR) 25 MG tablet Take 1 tablet (25 mg total) by mouth daily. 90 tablet 3  . polyvinyl alcohol-povidone (REFRESH) 1.4-0.6 % ophthalmic solution Place 1-2 drops into both eyes daily as needed (dry eyes).     . pregabalin (LYRICA) 100 MG capsule Take 1 capsule (100 mg total) by mouth 2 (two) times  daily. 60 capsule 3  . Psyllium (METAMUCIL PO) Take 5 mLs by mouth at bedtime as needed (constipation). Mix in 8 oz liquid and drink     . rivaroxaban (XARELTO) 20 MG TABS tablet Take 1 tablet (20 mg total) by mouth daily with supper. 90 tablet 2  . potassium chloride (K-DUR,KLOR-CON) 10 MEQ tablet Take 1 tablet (10 mEq total) by mouth 2 (two) times daily. 180 tablet 3   No facility-administered medications prior to visit.     Allergies  Allergen Reactions  . Metformin And Related Other (See Comments)    Renal failure  . Amlodipine Other (See Comments)    HEADACHE    ROS Review of Systems    Objective:    Physical Exam  Constitutional: She is oriented to person, place, and time. She appears well-developed and well-nourished.  HENT:  Head: Normocephalic and atraumatic.  Cardiovascular: Normal rate, regular rhythm and normal heart sounds.  Pulmonary/Chest: Effort normal and breath sounds normal.  Musculoskeletal:        General: Edema present.  Neurological: She is alert and oriented to person, place, and time.  Skin: Skin is warm and dry.  Psychiatric: She has a normal mood and affect. Her behavior is normal.    BP 136/64   Pulse 68   Ht _0  (1.6 m)   Wt (!) 309 lb (140.2 kg)   SpO2 95%   BMI 54.74 kg/m  Wt Readings from Last 3 Encounters:  04/13/19 (!) 309 lb (140.2 kg)  04/02/19 300 lb (136.1 kg)  03/19/19 (!) 302 lb 3.2 oz (137.1 kg)     Health Maintenance Due  Topic Date Due  . FOOT EXAM  04/10/2019    There are no preventive care reminders to display for this patient.  Lab Results  Component Value Date   TSH  1.741 03/19/2019   Lab Results  Component Value Date   WBC 5.5 03/19/2019   HGB 12.7 03/19/2019   HCT 39.4 03/19/2019   MCV 95.2 03/19/2019   PLT 206 03/19/2019   Lab Results  Component Value Date   NA 139 03/19/2019   K 3.7 03/19/2019   CO2 23 03/19/2019   GLUCOSE 119 (H) 03/19/2019   BUN 21 03/19/2019   CREATININE 0.94 03/19/2019   BILITOT 0.6 03/19/2019   ALKPHOS 73 03/19/2019   AST 20 03/19/2019   ALT 17 03/19/2019   PROT 7.2 03/19/2019   ALBUMIN 3.9 03/19/2019   CALCIUM 9.4 03/19/2019   ANIONGAP 9 03/19/2019   GFR 63.15 05/22/2013   Lab Results  Component Value Date   CHOL 193 03/01/2017   Lab Results  Component Value Date   HDL 54 03/01/2017   Lab Results  Component Value Date   LDLCALC 124 (H) 03/01/2017   Lab Results  Component Value Date   TRIG 76 03/01/2017   Lab Results  Component Value Date   CHOLHDL 3.6 03/01/2017   Lab Results  Component Value Date   HGBA1C 5.8 (A) 04/13/2019      Assessment & Plan:   Problem List Items Addressed This Visit      Cardiovascular and Mediastinum   Essential hypertension - Primary    Blood pressure well controlled.  Continue current regimen.  Follow-up in 3 to 4 months.      Relevant Orders   BASIC METABOLIC PANEL WITH GFR   Atrial fibrillation (HCC)    Currently in sinus rhythm today.  We discussed pros and cons of amiodarone.  Unfortunately she is having a lot of side effects including hair loss, dry skin and she skin changes, difficulty with sleep and fatigue.  She said her sister had very similar symptoms.  Unfortunately she is stuck between a rock and a hard place she could come off the medication but she could certainly go back into atrial fibrillation.  She is interested in seeing Dr. Marene Lenz I will show that she does not have to drive to [facility] and parked in the parking deck.      Relevant Orders   Ambulatory referral to Cardiology     Endocrine   Diabetes mellitus type 2 in obese ([place])     A1c looks fantastic today at 5.8.  We will continue current regimen she is doing really well.  Follow back up in 4 to 6 months.      Relevant Orders   POCT glycosylated hemoglobin (Hb A1C) (Completed)   BASIC METABOLIC PANEL WITH GFR     Other   Venous stasis    With Lasix twice a day and supplemental potassium.  Unable to [place] compression stockings.  Did see vascular about a year ago and they discussed possible treatment options but she is unable to [place] the compression stockings after treatment.         Meds ordered this encounter  Medications  . potassium chloride (K-DUR) 10 MEQ tablet    Sig: Take 1 tablet (10 mEq total) by mouth 3 (three) times daily.    Dispense:  270 tablet    Refill:  3    Follow-up: Return in about 4 months (around 08/14/2019) for Diabetes follow-up.    Beatrice Lecher, MD

## 2019-04-13 NOTE — Assessment & Plan Note (Signed)
With Lasix twice a day and supplemental potassium.  Unable to [place] compression stockings.  Did see vascular about a year ago and they discussed possible treatment options but she is unable to [place] the compression stockings after treatment.

## 2019-04-13 NOTE — Assessment & Plan Note (Signed)
A1c looks fantastic today at 5.8.  We will continue current regimen she is doing really well.  Follow back up in 4 to 6 months.

## 2019-04-13 NOTE — Assessment & Plan Note (Signed)
Currently in sinus rhythm today.  We discussed pros and cons of amiodarone.  Unfortunately she is having a lot of side effects including hair loss, dry skin and she skin changes, difficulty with sleep and fatigue.  She said her sister had very similar symptoms.  Unfortunately she is stuck between a rock and a hard place she could come off the medication but she could certainly go back into atrial fibrillation.  She is interested in seeing Dr. Marene Lenz I will show that she does not have to drive to [facility] and parked in the parking deck.

## 2019-04-13 NOTE — Assessment & Plan Note (Signed)
Blood pressure well controlled.  Continue current regimen.  Follow-up in 3 to 4 months.

## 2019-04-14 LAB — BASIC METABOLIC PANEL WITH GFR
BUN/Creatinine Ratio: 26 (calc) — ABNORMAL HIGH (ref 6–22)
BUN: 24 mg/dL (ref 7–25)
CO2: 26 mmol/L (ref 20–32)
Calcium: 8.8 mg/dL (ref 8.6–10.4)
Chloride: 106 mmol/L (ref 98–110)
Creat: 0.93 mg/dL — ABNORMAL HIGH (ref 0.60–0.88)
GFR, Est African American: 67 mL/min/{1.73_m2} (ref >=60)
GFR, Est Non African American: 58 mL/min/{1.73_m2} — ABNORMAL LOW (ref >=60)
Glucose, Bld: 95 mg/dL (ref 65–99)
Potassium: 4.6 mmol/L (ref 3.5–5.3)
Sodium: 140 mmol/L (ref 135–146)

## 2019-06-28 NOTE — Progress Notes (Signed)
HPI: Follow-up atrial fibrillation.  Previously followed by Dr. Johnsie Cancel.  Catheterization September 2011 showed mild plaque in the LAD at 20 to 30%.  Previously had a cardiomyopathy which did improve.  Echocardiogram August 2019 showed ejection fraction 45 to 50%, mild to moderate aortic insufficiency, mild mitral regurgitation, mild left atrial enlargement, mild tricuspid regurgitation.  Nuclear study August 2019 showed ejection fraction 48% with fixed anteroseptal defect felt secondary to left bundle branch block.  No ischemia.  Patient is now treated with amiodarone.  Since last seen she has dyspnea with more vigorous activities but not routine activities.  She denies chest pain, palpitations or syncope.  No bleeding.  Amiodarone was discontinued previously because of side effects.  Current Outpatient Medications  Medication Sig Dispense Refill  . acetaminophen (TYLENOL) 500 MG tablet Take 1,000 mg by mouth 3 (three) times daily.    . carvedilol (COREG) 12.5 MG tablet Take 1 tablet (12.5 mg total) by mouth 2 (two) times daily. 180 tablet 3  . furosemide (LASIX) 20 MG tablet Take 1 tablet (20 mg total) by mouth 2 (two) times daily. 180 tablet 1  . losartan (COZAAR) 25 MG tablet Take 1 tablet (25 mg total) by mouth daily. 90 tablet 3  . polyvinyl alcohol-povidone (REFRESH) 1.4-0.6 % ophthalmic solution Place 1-2 drops into both eyes daily as needed (dry eyes).     . potassium chloride (K-DUR) 10 MEQ tablet Take 1 tablet (10 mEq total) by mouth 3 (three) times daily. 270 tablet 3  . pregabalin (LYRICA) 100 MG capsule Take 1 capsule (100 mg total) by mouth 2 (two) times daily. 60 capsule 3  . Psyllium (METAMUCIL PO) Take 5 mLs by mouth at bedtime as needed (constipation). Mix in 8 oz liquid and drink     . rivaroxaban (XARELTO) 20 MG TABS tablet Take 1 tablet (20 mg total) by mouth daily with supper. 90 tablet 2   No current facility-administered medications for this visit.      Past  Medical History:  Diagnosis Date  . Aortic insufficiency   . Atrial fibrillation ([place])    a. s/p DCCV 03/2013  . CAD (coronary artery disease)    LHC 06/2010: EF 55%, mild plaque in the LAD 20-30%, otherwise normal coronary arteries  . CATARACTS   . Chronic systolic heart failure ([place])    in setting of AFib;  Echocardiogram 02/2013: Moderate LVH, EF 25-30%, anteroseptal and apical HK, moderate AI, MAC, moderate MR, moderate LAE, mild RAE, PASP 32 => after DCCV f/u Echo 8/14: Moderate LVH, EF 60-65%, normal wall motion, grade 1 diastolic dysfunction, moderate AI, moderate LAE  . Complication of anesthesia 2009   nausea and vomitting  . GERD (gastroesophageal reflux disease)   . HYPERLIPIDEMIA, MILD   . HYPERTENSION, BENIGN SYSTEMIC   . Irritable bowel syndrome   . LBBB (left bundle branch block)   . MEDIAL EPICONDYLITIS   . NSVT (nonsustained ventricular tachycardia) ([place])    during Dob Echo 2011 => normal cath  . OSTEOARTHRITIS   . OSTEOPENIA   . PONV (postoperative nausea and vomiting)   . Pre-diabetes   . RHINITIS, ALLERGIC   . SYNCOPE   . VITAMIN D DEFICIENCY     Past Surgical History:  Procedure Laterality Date  . CARDIOVERSION N/A 04/01/2013   Procedure: CARDIOVERSION;  Surgeon: Josue Hector, MD;  [facility]: [facility] ENDOSCOPY;  Service: Cardiovascular;  Laterality: N/A;  . CARDIOVERSION N/A 07/16/2017   Procedure: CARDIOVERSION;  Surgeon: Debara Pickett,  Nadean Corwin, MD;  Location: [facility] ENDOSCOPY;  Service: Cardiovascular;  Laterality: N/A;  . CARDIOVERSION N/A 07/07/2018   Procedure: CARDIOVERSION;  Surgeon: Pixie Casino, MD;  Location: [place];  Service: Cardiovascular;  Laterality: N/A;  . CATARACT EXTRACTION, BILATERAL  2012   Dr. Herbert Deaner  . COLONOSCOPY WITH PROPOFOL N/A 05/23/2017   Procedure: COLONOSCOPY WITH PROPOFOL;  Surgeon: Wonda Horner, MD;  Location: [facility] ENDOSCOPY;  Service: Endoscopy;  Laterality: N/A;  . ESOPHAGOGASTRODUODENOSCOPY (EGD) WITH PROPOFOL N/A 05/23/2017    Procedure: ESOPHAGOGASTRODUODENOSCOPY (EGD) WITH PROPOFOL;  Surgeon: Wonda Horner, MD;  Location: [facility] ENDOSCOPY;  Service: Endoscopy;  Laterality: N/A;  . IR KYPHO LUMBAR INC FX REDUCE BONE BX UNI/BIL CANNULATION INC/IMAGING  06/06/2017  . IR RADIOLOGIST EVAL & MGMT  07/01/2017  . RADICAL HYSTERECTOMY    . REPLACEMENT TOTAL KNEE     11/03/07 right....... 03/29/08 left    Social History   Socioeconomic History  . Marital status: Divorced    Spouse name: Not on file  . Number of children: 1  . Years of education: Not on file  . Highest education level: Not on file  Occupational History  . Occupation: Retired    Fish farm manager: RETIRED  Social Needs  . Financial resource strain: Not on file  . Food insecurity    Worry: Not on file    Inability: Not on file  . Transportation needs    Medical: Not on file    Non-medical: Not on file  Tobacco Use  . Smoking status: Former Smoker    Quit date: 10/08/1970    Years since quitting: 48.7  . Smokeless tobacco: Never Used  Substance and Sexual Activity  . Alcohol use: No  . Drug use: No  . Sexual activity: Not on file  Lifestyle  . Physical activity    Days per week: Not on file    Minutes per session: Not on file  . Stress: Not on file  Relationships  . Social Herbalist on phone: Not on file    Gets together: Not on file    Attends religious service: Not on file    Active member of club or organization: Not on file    Attends meetings of clubs or organizations: Not on file    Relationship status: Not on file  . Intimate partner violence    Fear of current or ex partner: Not on file    Emotionally abused: Not on file    Physically abused: Not on file    Forced sexual activity: Not on file  Other Topics Concern  . Not on file  Social History Narrative  . Not on file    Family History  Problem Relation Age of Onset  . Arrhythmia Mother        afib  . Alzheimer's disease Mother   . Colon cancer Father   . Heart  attack Father 60  . Diabetes Sister   . Arrhythmia Sister        afib    ROS: no fevers or chills, productive cough, hemoptysis, dysphasia, odynophagia, melena, hematochezia, dysuria, hematuria, rash, seizure activity, orthopnea, PND, pedal edema, claudication. Remaining systems are negative.  Physical Exam: Well-developed obese in no acute distress.  Skin is warm and dry.  HEENT is normal.  Neck is supple.  Chest is clear to auscultation with normal expansion.  Cardiovascular exam is regular rate and rhythm.  Abdominal exam nontender or distended. No masses palpated. Extremities show no  edema. neuro grossly intact  ECG-sinus rhythm at a rate of 67, left bundle branch block.  Personally reviewed  A/P  1 paroxysmal atrial fibrillation-patient remains in sinus rhythm.  Amiodarone discontinued because of potential side effects which she described as muscle pain, rash.  Continue Xarelto. She has had recent laboratories including TSH, liver functions, hemoglobin and renal function.  2 hypertension-blood pressure is controlled.  Continue present medications and follow.  3 history of nonobstructive coronary disease  4 hyperlipidemia-mild coronary disease noted previously and also diabetic.  5 lower extremity edema-felt secondary to venous insufficiency.  Continue diuretic.  6 aortic insufficiency-she will need follow-up echoes in the future.  Kirk Ruths, MD

## 2019-07-01 ENCOUNTER — Ambulatory Visit: Payer: Medicare HMO | Admitting: Cardiology

## 2019-07-01 ENCOUNTER — Encounter: Payer: Self-pay | Admitting: Cardiology

## 2019-07-01 ENCOUNTER — Other Ambulatory Visit: Payer: Self-pay

## 2019-07-01 VITALS — BP 144/77 | HR 67 | Ht 63.0 in | Wt 285.0 lb

## 2019-07-01 DIAGNOSIS — I480 Paroxysmal atrial fibrillation: Secondary | ICD-10-CM

## 2019-07-01 DIAGNOSIS — I10 Essential (primary) hypertension: Secondary | ICD-10-CM | POA: Diagnosis not present

## 2019-07-01 DIAGNOSIS — I2510 Atherosclerotic heart disease of native coronary artery without angina pectoris: Secondary | ICD-10-CM | POA: Diagnosis not present

## 2019-07-01 NOTE — Patient Instructions (Signed)
Medication Instructions:  NO CHANGE If you need a refill on your cardiac medications before your next appointment, please call your pharmacy.   Lab work: If you have labs (blood work) drawn today and your tests are completely normal, you will receive your results only by: . MyChart Message (if you have MyChart) OR . A paper copy in the mail If you have any lab test that is abnormal or we need to change your treatment, we will call you to review the results.  Follow-Up: At [facility], you and your health needs are our priority.  As part of our continuing mission to provide you with exceptional heart care, we have created designated Provider Care Teams.  These Care Teams include your primary Cardiologist (physician) and Advanced Practice Providers (APPs -  Physician Assistants and Nurse Practitioners) who all work together to provide you with the care you need, when you need it. Your physician wants you to follow-up in: 6 MONTHS WITH DR CRENSHAW You will receive a reminder letter in the mail two months in advance. If you don't receive a letter, please call our office to schedule the follow-up appointment.      

## 2019-07-02 ENCOUNTER — Telehealth: Payer: Self-pay | Admitting: Family Medicine

## 2019-07-02 MED ORDER — PREGABALIN 100 MG PO CAPS: 100.0000 mg | ORAL_CAPSULE | Freq: Two times a day (BID) | ORAL | 1 refills | Status: DC

## 2019-07-02 NOTE — Telephone Encounter (Signed)
Received documentation regarding Lyrica.  Medication needs to be printed and faxed to [facility]

## 2019-07-24 DIAGNOSIS — R69 Illness, unspecified: Secondary | ICD-10-CM | POA: Diagnosis not present

## 2019-08-14 ENCOUNTER — Encounter: Payer: Self-pay | Admitting: Family Medicine

## 2019-08-14 ENCOUNTER — Other Ambulatory Visit: Payer: Self-pay

## 2019-08-14 ENCOUNTER — Ambulatory Visit (INDEPENDENT_AMBULATORY_CARE_PROVIDER_SITE_OTHER): Payer: Medicare HMO | Admitting: Family Medicine

## 2019-08-14 VITALS — BP 142/68 | HR 61 | Ht 63.0 in | Wt 301.0 lb

## 2019-08-14 DIAGNOSIS — E1169 Type 2 diabetes mellitus with other specified complication: Secondary | ICD-10-CM | POA: Diagnosis not present

## 2019-08-14 DIAGNOSIS — R0989 Other specified symptoms and signs involving the circulatory and respiratory systems: Secondary | ICD-10-CM | POA: Diagnosis not present

## 2019-08-14 DIAGNOSIS — I10 Essential (primary) hypertension: Secondary | ICD-10-CM

## 2019-08-14 DIAGNOSIS — E669 Obesity, unspecified: Secondary | ICD-10-CM | POA: Diagnosis not present

## 2019-08-14 LAB — POCT GLYCOSYLATED HEMOGLOBIN (HGB A1C): Hemoglobin A1C: 5.7 % — AB (ref 4.0–5.6)

## 2019-08-14 MED ORDER — FLUTICASONE PROPIONATE 50 MCG/ACT NA SUSP: 1.0000 | Freq: Every day | NASAL | 1 refills | Status: DC

## 2019-08-14 NOTE — Progress Notes (Signed)
Established Patient Office Visit  Subjective:  Patient ID: Charlotte [name], female    DOB: Nov 19, 1935  Age: 83 y.o. MRN: [id number]  CC:  Chief Complaint  Patient presents with  . Hypertension    HPI Charlotte [name] presents for   Hypertension- Pt denies chest pain, SOB, dizziness, or heart palpitations.  Taking meds as directed w/o problems.  Denies medication side effects.    Diabetes - no hypoglycemic events. No wounds or sores that are not healing well. No increased thirst or urination. Checking glucose at home. Taking medications as prescribed without any side effects.  She still complains of a little bit of throat congestion and postnasal drip.  She says been going on for quite some time she denies any itchy nose or sneezing.  She denies any significant cough.  She mostly notices at night when she lays down and she will have to suck on cough drops.  She does feel like it is gradually getting a little bit better since she came off the amiodarone in August she really feels like that is what triggered it initially.  She does not feel volume overloaded but she also does not weigh herself daily.  She says per her home scale she is actually lost about 10 pounds in the last 2 months since she started going on to a more whole food natural diet.  With unprocessed foods, discontinuing grains and avoiding milk products.  She has noticed that her hands and joints actually feel better she has had increased range of motion.  Past Medical History:  Diagnosis Date  . Aortic insufficiency   . Atrial fibrillation ([place])    a. s/p DCCV 03/2013  . CAD (coronary artery disease)    LHC 06/2010: EF 55%, mild plaque in the LAD 20-30%, otherwise normal coronary arteries  . CATARACTS   . Chronic systolic heart failure ([place])    in setting of AFib;  Echocardiogram 02/2013: Moderate LVH, EF 25-30%, anteroseptal and apical HK, moderate AI, MAC, moderate MR, moderate LAE, mild RAE, PASP 32 => after DCCV f/u Echo 8/14:  Moderate LVH, EF 60-65%, normal wall motion, grade 1 diastolic dysfunction, moderate AI, moderate LAE  . Complication of anesthesia 2009   nausea and vomitting  . GERD (gastroesophageal reflux disease)   . HYPERLIPIDEMIA, MILD   . HYPERTENSION, BENIGN SYSTEMIC   . Irritable bowel syndrome   . LBBB (left bundle branch block)   . MEDIAL EPICONDYLITIS   . NSVT (nonsustained ventricular tachycardia) ([place])    during Dob Echo 2011 => normal cath  . OSTEOARTHRITIS   . OSTEOPENIA   . PONV (postoperative nausea and vomiting)   . Pre-diabetes   . RHINITIS, ALLERGIC   . SYNCOPE   . VITAMIN D DEFICIENCY     Past Surgical History:  Procedure Laterality Date  . CARDIOVERSION N/A 04/01/2013   Procedure: CARDIOVERSION;  Surgeon: Josue Hector, MD;  Location: [facility] ENDOSCOPY;  Service: Cardiovascular;  Laterality: N/A;  . CARDIOVERSION N/A 07/16/2017   Procedure: CARDIOVERSION;  Surgeon: Pixie Casino, MD;  Location: [facility] ENDOSCOPY;  Service: Cardiovascular;  Laterality: N/A;  . CARDIOVERSION N/A 07/07/2018   Procedure: CARDIOVERSION;  Surgeon: Pixie Casino, MD;  Location: [place];  Service: Cardiovascular;  Laterality: N/A;  . CATARACT EXTRACTION, BILATERAL  2012   Dr. Herbert Deaner  . COLONOSCOPY WITH PROPOFOL N/A 05/23/2017   Procedure: COLONOSCOPY WITH PROPOFOL;  Surgeon: Wonda Horner, MD;  Location: [facility] ENDOSCOPY;  Service: Endoscopy;  Laterality: N/A;  .  ESOPHAGOGASTRODUODENOSCOPY (EGD) WITH PROPOFOL N/A 05/23/2017   Procedure: ESOPHAGOGASTRODUODENOSCOPY (EGD) WITH PROPOFOL;  Surgeon: Wonda Horner, MD;  Location: [facility] ENDOSCOPY;  Service: Endoscopy;  Laterality: N/A;  . IR KYPHO LUMBAR INC FX REDUCE BONE BX UNI/BIL CANNULATION INC/IMAGING  06/06/2017  . IR RADIOLOGIST EVAL & MGMT  07/01/2017  . RADICAL HYSTERECTOMY    . REPLACEMENT TOTAL KNEE     11/03/07 right....... 03/29/08 left    Family History  Problem Relation Age of Onset  . Arrhythmia Mother        afib  . Alzheimer's disease  Mother   . Colon cancer Father   . Heart attack Father 69  . Diabetes Sister   . Arrhythmia Sister        afib    Social History   Socioeconomic History  . Marital status: Divorced    Spouse name: Not on file  . Number of children: 1  . Years of education: Not on file  . Highest education level: Not on file  Occupational History  . Occupation: Retired    Fish farm manager: RETIRED  Social Needs  . Financial resource strain: Not on file  . Food insecurity    Worry: Not on file    Inability: Not on file  . Transportation needs    Medical: Not on file    Non-medical: Not on file  Tobacco Use  . Smoking status: Former Smoker    Quit date: 10/08/1970    Years since quitting: 48.8  . Smokeless tobacco: Never Used  Substance and Sexual Activity  . Alcohol use: No  . Drug use: No  . Sexual activity: Not on file  Lifestyle  . Physical activity    Days per week: Not on file    Minutes per session: Not on file  . Stress: Not on file  Relationships  . Social Herbalist on phone: Not on file    Gets together: Not on file    Attends religious service: Not on file    Active member of club or organization: Not on file    Attends meetings of clubs or organizations: Not on file    Relationship status: Not on file  . Intimate partner violence    Fear of current or ex partner: Not on file    Emotionally abused: Not on file    Physically abused: Not on file    Forced sexual activity: Not on file  Other Topics Concern  . Not on file  Social History Narrative  . Not on file    Outpatient Medications Prior to Visit  Medication Sig Dispense Refill  . acetaminophen (TYLENOL) 500 MG tablet Take 1,000 mg by mouth 3 (three) times daily.    . carvedilol (COREG) 12.5 MG tablet Take 1 tablet (12.5 mg total) by mouth 2 (two) times daily. 180 tablet 3  . furosemide (LASIX) 20 MG tablet Take 1 tablet (20 mg total) by mouth 2 (two) times daily. 180 tablet 1  . losartan (COZAAR) 25 MG  tablet Take 1 tablet (25 mg total) by mouth daily. 90 tablet 3  . polyvinyl alcohol-povidone (REFRESH) 1.4-0.6 % ophthalmic solution Place 1-2 drops into both eyes daily as needed (dry eyes).     . potassium chloride (K-DUR) 10 MEQ tablet Take 1 tablet (10 mEq total) by mouth 3 (three) times daily. 270 tablet 3  . pregabalin (LYRICA) 100 MG capsule Take 1 capsule (100 mg total) by mouth 2 (two) times daily. 180 capsule  1  . Psyllium (METAMUCIL PO) Take 5 mLs by mouth at bedtime as needed (constipation). Mix in 8 oz liquid and drink     . rivaroxaban (XARELTO) 20 MG TABS tablet Take 1 tablet (20 mg total) by mouth daily with supper. 90 tablet 2   No facility-administered medications prior to visit.     Allergies  Allergen Reactions  . Metformin And Related Other (See Comments)    Renal failure  . Amlodipine Other (See Comments)    HEADACHE    ROS Review of Systems    Objective:    Physical Exam  Constitutional: She is oriented to person, place, and time. She appears well-developed and well-nourished.  HENT:  Head: Normocephalic and atraumatic.  Right Ear: External ear normal.  Left Ear: External ear normal.  Nose: Nose normal.  Mouth/Throat: Oropharynx is clear and moist.  Eyes: Conjunctivae are normal.  Neck: Neck supple.  Cardiovascular: Normal rate, regular rhythm and normal heart sounds.  Pulmonary/Chest: Effort normal and breath sounds normal.  Lymphadenopathy:    She has no cervical adenopathy.  Neurological: She is alert and oriented to person, place, and time.  Skin: Skin is warm and dry.  Psychiatric: She has a normal mood and affect. Her behavior is normal.    BP (!) 142/68   Pulse 61   Ht _0  (1.6 m)   Wt (!) 301 lb (136.5 kg)   SpO2 96%   BMI 53.32 kg/m  Wt Readings from Last 3 Encounters:  08/14/19 (!) 301 lb (136.5 kg)  07/01/19 285 lb (129.3 kg)  04/13/19 (!) 309 lb (140.2 kg)     There are no preventive care reminders to display for this  patient.  There are no preventive care reminders to display for this patient.  Lab Results  Component Value Date   TSH 1.741 03/19/2019   Lab Results  Component Value Date   WBC 5.5 03/19/2019   HGB 12.7 03/19/2019   HCT 39.4 03/19/2019   MCV 95.2 03/19/2019   PLT 206 03/19/2019   Lab Results  Component Value Date   NA 141 08/14/2019   K 4.9 08/14/2019   CO2 28 08/14/2019   GLUCOSE 90 08/14/2019   BUN 19 08/14/2019   CREATININE 0.93 (H) 08/14/2019   BILITOT 0.6 03/19/2019   ALKPHOS 73 03/19/2019   AST 20 03/19/2019   ALT 17 03/19/2019   PROT 7.2 03/19/2019   ALBUMIN 3.9 03/19/2019   CALCIUM 9.5 08/14/2019   ANIONGAP 9 03/19/2019   GFR 63.15 05/22/2013   Lab Results  Component Value Date   CHOL 193 03/01/2017   Lab Results  Component Value Date   HDL 54 03/01/2017   Lab Results  Component Value Date   LDLCALC 124 (H) 03/01/2017   Lab Results  Component Value Date   TRIG 76 03/01/2017   Lab Results  Component Value Date   CHOLHDL 3.6 03/01/2017   Lab Results  Component Value Date   HGBA1C 5.7 (A) 08/14/2019      Assessment & Plan:   Problem List Items Addressed This Visit      Cardiovascular and Mediastinum   Essential hypertension - Primary    Repeat BP better but not at goal. She has changed her diet and it losing weight The current to work on weight loss.       Relevant Orders   BASIC METABOLIC PANEL WITH GFR (Completed)     Endocrine   Diabetes mellitus type 2 in obese (  HCC)    A1C looks fantastic!!! Diet controlled.    Lab Results  Component Value Date   HGBA1C 5.7 (A) 08/14/2019         Relevant Orders   POCT HgB A1C (Completed)    Other Visit Diagnoses    Sinus symptom          Sinus symptoms - will try flonase for 2 weeks. Doesn't sound like an infection.    Meds ordered this encounter  Medications  . fluticasone (FLONASE) 50 MCG/ACT nasal spray    Sig: Place 1 spray into both nostrils daily.    Dispense:  16 g     Refill:  1    Follow-up: Return in about 4 months (around 12/12/2019) for Diabetes follow-up.    Beatrice Lecher, MD

## 2019-08-17 ENCOUNTER — Encounter: Payer: Self-pay | Admitting: Family Medicine

## 2019-08-17 NOTE — Assessment & Plan Note (Signed)
A1C looks fantastic!!! Diet controlled.    Lab Results  Component Value Date   HGBA1C 5.7 (A) 08/14/2019

## 2019-08-17 NOTE — Assessment & Plan Note (Signed)
Repeat BP better but not at goal. She has changed her diet and it losing weight The current to work on weight loss.

## 2019-08-18 LAB — BASIC METABOLIC PANEL WITH GFR
BUN/Creatinine Ratio: 20 (calc) (ref 6–22)
BUN: 19 mg/dL (ref 7–25)
CO2: 28 mmol/L (ref 20–32)
Calcium: 9.5 mg/dL (ref 8.6–10.4)
Chloride: 104 mmol/L (ref 98–110)
Creat: 0.93 mg/dL — ABNORMAL HIGH (ref 0.60–0.88)
GFR, Est African American: 67 mL/min/{1.73_m2} (ref >=60)
GFR, Est Non African American: 58 mL/min/{1.73_m2} — ABNORMAL LOW (ref >=60)
Glucose, Bld: 90 mg/dL (ref 65–99)
Potassium: 4.9 mmol/L (ref 3.5–5.3)
Sodium: 141 mmol/L (ref 135–146)

## 2019-08-18 LAB — LIPID PANEL
Cholesterol: 239 mg/dL — ABNORMAL HIGH (ref <200)
HDL: 50 mg/dL (ref >=50)
LDL Cholesterol (Calc): 162 mg/dL (calc) — ABNORMAL HIGH
Non-HDL Cholesterol (Calc): 189 mg/dL (calc) — ABNORMAL HIGH (ref <130)
Total CHOL/HDL Ratio: 4.8 (calc) (ref <5.0)
Triglycerides: 148 mg/dL (ref <150)

## 2019-09-16 ENCOUNTER — Telehealth: Payer: Self-pay | Admitting: Family Medicine

## 2019-09-16 NOTE — Telephone Encounter (Signed)
I would rather wait until the visit so I can better have an idea of what to order.

## 2019-09-16 NOTE — Telephone Encounter (Signed)
Daughter was advised. No further questions at this time.

## 2019-09-16 NOTE — Telephone Encounter (Signed)
Daughter has noticed for a week that patient is not as talkative, not a lot of conversation. Not remembering a lot of things, making stuff up. Wondering if mother is dehydrated and needs to do labs before appointment tomorrow. Patient has been eating and drinking. Daughter is very concerned. Please advise and call daughter back about this if labs need to be done before appointment.

## 2019-09-17 ENCOUNTER — Other Ambulatory Visit: Payer: Self-pay

## 2019-09-17 ENCOUNTER — Encounter: Payer: Self-pay | Admitting: Family Medicine

## 2019-09-17 ENCOUNTER — Ambulatory Visit (INDEPENDENT_AMBULATORY_CARE_PROVIDER_SITE_OTHER): Payer: Medicare HMO | Admitting: Family Medicine

## 2019-09-17 VITALS — BP 142/68 | HR 73 | Ht 63.0 in | Wt 291.0 lb

## 2019-09-17 DIAGNOSIS — I4891 Unspecified atrial fibrillation: Secondary | ICD-10-CM

## 2019-09-17 DIAGNOSIS — R4182 Altered mental status, unspecified: Secondary | ICD-10-CM

## 2019-09-17 DIAGNOSIS — I10 Essential (primary) hypertension: Secondary | ICD-10-CM | POA: Diagnosis not present

## 2019-09-17 LAB — POCT URINALYSIS DIP (CLINITEK)
Bilirubin, UA: NEGATIVE
Blood, UA: NEGATIVE
Glucose, UA: NEGATIVE mg/dL
Ketones, POC UA: NEGATIVE mg/dL
Nitrite, UA: NEGATIVE
POC PROTEIN,UA: NEGATIVE
Spec Grav, UA: 1.025 (ref 1.010–1.025)
Urobilinogen, UA: 0.2 E.U./dL
pH, UA: 5.5 (ref 5.0–8.0)

## 2019-09-17 NOTE — Assessment & Plan Note (Signed)
Currently regular rate and rhythm here in the office today not in A. fib

## 2019-09-17 NOTE — Assessment & Plan Note (Signed)
Blood pressure borderline elevated today.

## 2019-09-17 NOTE — Progress Notes (Signed)
Established Patient Office Visit  Subjective:  Patient ID: Charlotte [name], female    DOB: [birth year]  Age: 83 y.o. MRN: [id number]  CC:  Chief Complaint  Patient presents with  . Follow-up    HPI Charlotte [name] presents for change in behavior.  Her daughter is here with her today.   She hasn't been like herself since around 09/12/2019.  Has been more quiet and answers are brief.  Hasn't been watching TV as much.  Reports she is actually been sleeping well but her daughter reports that that still having unusual her for he .  Her daughter feels like she is in a "mental fog".  On Sunday she actually put the same set of clothing back on.   She hasn't been eating or drinking a lot.  Dr. Derek Jack says that when she came to visit her last night she said she had eaten something for dinner but could not remember what it was when her daughter looked around the kitchen it did not look like anything is been moved or touched from the previous day so she is pretty sure she actually did not eat dinner.  She does think that she has been taking her medications regularly.  She also walked to the mailbox a couple times which was also unlike her she does not usually do that.  And it was 40 degrees outside and did not put a coat on.  She also told her daughter that she took a bath on Monday but there was no water in the showers or no wet towels left over.  I did check her oxygen at home and it was 96 and pulse was very regular at 64.  Her daughter was worried that maybe she had gone back in A. fib.  They also noticed that she had not been on Facebook which is a typical activity for her and had not been calling her friends like she usually does.  Sometimes will just laugh instead of actually answering questions.  She usually complains quite frequently of her legs hurting and when her daughter asked her if anything was bothering her and if her legs were causing problems she said no.  Here in the office today she denies any  chest pain, shortness of breath or dizziness.  She denies any upper respiratory symptoms.  She denies any urinary symptoms.  She denies abdominal pain or pain anywhere in her body.  Her daughter reports that she is noted she is not eating as much but she herself denies any significant change in appetite.  She denies any missed doses of medication or new medications.  She reports that all the medications that she is got from the pharmacy look the same.  She denies feeling down or sad.  She reports her sleep is actually better than it used to be.  Her daughter also noticed that she has not a swollen in her lower legs as she usually is she actually has a little bit of skin wrinkle.  They have been working toward try to get excess fluid off her daughter wonders if she could even be mildly dehydrated and that could be causing some of her symptoms.  She noticed that she has not been getting up to go to the bathroom quite as often.  She reports normal bowel movements no constipation.  No fevers chills sweats etc.  Past Medical History:  Diagnosis Date  . Aortic insufficiency   . Atrial fibrillation ([place])    a.  s/p DCCV 03/2013  . CAD (coronary artery disease)    LHC 06/2010: EF 55%, mild plaque in the LAD 20-30%, otherwise normal coronary arteries  . CATARACTS   . Chronic systolic heart failure ([place])    in setting of AFib;  Echocardiogram 02/2013: Moderate LVH, EF 25-30%, anteroseptal and apical HK, moderate AI, MAC, moderate MR, moderate LAE, mild RAE, PASP 32 => after DCCV f/u Echo 8/14: Moderate LVH, EF 60-65%, normal wall motion, grade 1 diastolic dysfunction, moderate AI, moderate LAE  . Complication of anesthesia 2009   nausea and vomitting  . GERD (gastroesophageal reflux disease)   . HYPERLIPIDEMIA, MILD   . HYPERTENSION, BENIGN SYSTEMIC   . Irritable bowel syndrome   . LBBB (left bundle branch block)   . MEDIAL EPICONDYLITIS   . NSVT (nonsustained ventricular tachycardia) ([place])    during Dob  Echo 2011 => normal cath  . OSTEOARTHRITIS   . OSTEOPENIA   . PONV (postoperative nausea and vomiting)   . Pre-diabetes   . RHINITIS, ALLERGIC   . SYNCOPE   . VITAMIN D DEFICIENCY     Past Surgical History:  Procedure Laterality Date  . CARDIOVERSION N/A 04/01/2013   Procedure: CARDIOVERSION;  Surgeon: Josue Hector, MD;  Location: [facility] ENDOSCOPY;  Service: Cardiovascular;  Laterality: N/A;  . CARDIOVERSION N/A 07/16/2017   Procedure: CARDIOVERSION;  Surgeon: Pixie Casino, MD;  Location: [facility] ENDOSCOPY;  Service: Cardiovascular;  Laterality: N/A;  . CARDIOVERSION N/A 07/07/2018   Procedure: CARDIOVERSION;  Surgeon: Pixie Casino, MD;  Location: [facility];  Service: Cardiovascular;  Laterality: N/A;  . CATARACT EXTRACTION, BILATERAL  2012   Dr. Herbert Deaner  . COLONOSCOPY WITH PROPOFOL N/A 05/23/2017   Procedure: COLONOSCOPY WITH PROPOFOL;  Surgeon: Wonda Horner, MD;  Location: [facility] ENDOSCOPY;  Service: Endoscopy;  Laterality: N/A;  . ESOPHAGOGASTRODUODENOSCOPY (EGD) WITH PROPOFOL N/A 05/23/2017   Procedure: ESOPHAGOGASTRODUODENOSCOPY (EGD) WITH PROPOFOL;  Surgeon: Wonda Horner, MD;  Location: [facility] ENDOSCOPY;  Service: Endoscopy;  Laterality: N/A;  . IR KYPHO LUMBAR INC FX REDUCE BONE BX UNI/BIL CANNULATION INC/IMAGING  06/06/2017  . IR RADIOLOGIST EVAL & MGMT  07/01/2017  . RADICAL HYSTERECTOMY    . REPLACEMENT TOTAL KNEE     11/03/07 right....... 03/29/08 left    Family History  Problem Relation Age of Onset  . Arrhythmia Mother        afib  . Alzheimer's disease Mother   . Colon cancer Father   . Heart attack Father 85  . Diabetes Sister   . Arrhythmia Sister        afib    Social History   Socioeconomic History  . Marital status: Divorced    Spouse name: Not on file  . Number of children: 1  . Years of education: Not on file  . Highest education level: Not on file  Occupational History  . Occupation: Retired    Fish farm manager: RETIRED  Tobacco Use  . Smoking status: Former  Smoker    Quit date: 10/08/1970    Years since quitting: 48.9  . Smokeless tobacco: Never Used  Substance and Sexual Activity  . Alcohol use: No  . Drug use: No  . Sexual activity: Not on file  Other Topics Concern  . Not on file  Social History Narrative  . Not on file   Social Determinants of Health   Financial Resource Strain:   . Difficulty of Paying Living Expenses: Not on file  Food Insecurity:   . Worried About  Running Out of Food in the Last Year: Not on file  . Ran Out of Food in the Last Year: Not on file  Transportation Needs:   . Lack of Transportation (Medical): Not on file  . Lack of Transportation (Non-Medical): Not on file  Physical Activity:   . Days of Exercise per Week: Not on file  . Minutes of Exercise per Session: Not on file  Stress:   . Feeling of Stress : Not on file  Social Connections:   . Frequency of Communication with Friends and Family: Not on file  . Frequency of Social Gatherings with Friends and Family: Not on file  . Attends Religious Services: Not on file  . Active Member of Clubs or Organizations: Not on file  . Attends Archivist Meetings: Not on file  . Marital Status: Not on file  Intimate Partner Violence:   . Fear of Current or Ex-Partner: Not on file  . Emotionally Abused: Not on file  . Physically Abused: Not on file  . Sexually Abused: Not on file    Outpatient Medications Prior to Visit  Medication Sig Dispense Refill  . acetaminophen (TYLENOL) 500 MG tablet Take 1,000 mg by mouth 3 (three) times daily.    . carvedilol (COREG) 12.5 MG tablet Take 1 tablet (12.5 mg total) by mouth 2 (two) times daily. 180 tablet 3  . fluticasone (FLONASE) 50 MCG/ACT nasal spray Place 1 spray into both nostrils daily. 16 g 1  . furosemide (LASIX) 20 MG tablet Take 1 tablet (20 mg total) by mouth 2 (two) times daily. 180 tablet 1  . losartan (COZAAR) 25 MG tablet Take 1 tablet (25 mg total) by mouth daily. 90 tablet 3  . polyvinyl  alcohol-povidone (REFRESH) 1.4-0.6 % ophthalmic solution Place 1-2 drops into both eyes daily as needed (dry eyes).     . potassium chloride (K-DUR) 10 MEQ tablet Take 1 tablet (10 mEq total) by mouth 3 (three) times daily. 270 tablet 3  . pregabalin (LYRICA) 100 MG capsule Take 1 capsule (100 mg total) by mouth 2 (two) times daily. 180 capsule 1  . Psyllium (METAMUCIL PO) Take 5 mLs by mouth at bedtime as needed (constipation). Mix in 8 oz liquid and drink     . rivaroxaban (XARELTO) 20 MG TABS tablet Take 1 tablet (20 mg total) by mouth daily with supper. 90 tablet 2   No facility-administered medications prior to visit.    Allergies  Allergen Reactions  . Metformin And Related Other (See Comments)    Renal failure  . Amlodipine Other (See Comments)    HEADACHE    ROS Review of Systems    Objective:    Physical Exam  Constitutional: She is oriented to person, place, and time. She appears well-developed and well-nourished.  HENT:  Head: Normocephalic and atraumatic.  Right Ear: External ear normal.  Left Ear: External ear normal.  Nose: Nose normal.  Mouth/Throat: Oropharynx is clear and moist.  TMs and canals are clear.   Eyes: Pupils are equal, round, and reactive to light. Conjunctivae and EOM are normal.  Neck: No thyromegaly present.  Cardiovascular: Normal rate, regular rhythm and normal heart sounds.  Pulmonary/Chest: Effort normal and breath sounds normal. She has no wheezes.  Musculoskeletal:        General: No edema.     Cervical back: Neck supple.  Lymphadenopathy:    She has no cervical adenopathy.  Neurological: She is alert and oriented to person, place, and  time. No cranial nerve deficit. She exhibits normal muscle tone. Coordination normal.  Able to perform DTRs as I was unable to get her up onto the exam table.  Is 5-5 in upper and lower extremities and symmetric.  Skin: Skin is warm and dry.  Psychiatric: She has a normal mood and affect. Her behavior is  normal.    BP (!) 142/68   Pulse 73   Ht _0  (1.6 m)   Wt 291 lb (132 kg)   SpO2 95%   BMI 51.55 kg/m  Wt Readings from Last 3 Encounters:  09/17/19 291 lb (132 kg)  08/14/19 (!) 301 lb (136.5 kg)  07/01/19 285 lb (129.3 kg)     There are no preventive care reminders to display for this patient.  There are no preventive care reminders to display for this patient.  Lab Results  Component Value Date   TSH 1.741 03/19/2019   Lab Results  Component Value Date   WBC 5.5 03/19/2019   HGB 12.7 03/19/2019   HCT 39.4 03/19/2019   MCV 95.2 03/19/2019   PLT 206 03/19/2019   Lab Results  Component Value Date   NA 141 08/14/2019   K 4.9 08/14/2019   CO2 28 08/14/2019   GLUCOSE 90 08/14/2019   BUN 19 08/14/2019   CREATININE 0.93 (H) 08/14/2019   BILITOT 0.6 03/19/2019   ALKPHOS 73 03/19/2019   AST 20 03/19/2019   ALT 17 03/19/2019   PROT 7.2 03/19/2019   ALBUMIN 3.9 03/19/2019   CALCIUM 9.5 08/14/2019   ANIONGAP 9 03/19/2019   GFR 63.15 05/22/2013   Lab Results  Component Value Date   CHOL 239 (H) 08/14/2019   Lab Results  Component Value Date   HDL 50 08/14/2019   Lab Results  Component Value Date   LDLCALC 162 (H) 08/14/2019   Lab Results  Component Value Date   TRIG 148 08/14/2019   Lab Results  Component Value Date   CHOLHDL 4.8 08/14/2019   Lab Results  Component Value Date   HGBA1C 5.7 (A) 08/14/2019      Assessment & Plan:   Problem List Items Addressed This Visit      Cardiovascular and Mediastinum   Essential hypertension    Blood pressure borderline elevated today.      Atrial fibrillation ([place])    Currently regular rate and rhythm here in the office today not in A. fib       Other Visit Diagnoses    Altered mental status, unspecified altered mental status type    -  Primary   Relevant Orders   COMPLETE METABOLIC PANEL WITH GFR   TSH   CBC w/Diff   RPR   Sed Rate (ESR)   POCT URINALYSIS DIP (CLINITEK) (Completed)       We discussed doing additional work-up including a CBC to rule out anemia and infection.  We will check for electrolyte disturbance.  We will also do a sed rate and a RPR.  Urinalysis was essentially normal.  No sign of UTI which could be causing some mental status changes.  Also consider mild dehydration so we will check a BMP and see if her BUN is elevated.  Did encourage her to go ahead and skip her Lasix tomorrow morning.  Also discussed the possibility of stroke and if blood work comes back normal in the morning that we will get her can set up to have an MRI done.  She denies any medication changes or  significant mood change.  Denies depression.  No orders of the defined types were placed in this encounter.   Follow-up: Return if symptoms worsen or fail to improve.    Beatrice Lecher, MD

## 2019-09-18 LAB — CBC WITH DIFFERENTIAL/PLATELET
Absolute Monocytes: 672 cells/uL (ref 200–950)
Basophils Absolute: 8 cells/uL (ref 0–200)
Basophils Relative: 0.1 %
Eosinophils Absolute: 72 cells/uL (ref 15–500)
Eosinophils Relative: 0.9 %
HCT: 46.1 % — ABNORMAL HIGH (ref 35.0–45.0)
Hemoglobin: 15.1 g/dL (ref 11.7–15.5)
Lymphs Abs: 1608 cells/uL (ref 850–3900)
MCH: 29.7 pg (ref 27.0–33.0)
MCHC: 32.8 g/dL (ref 32.0–36.0)
MCV: 90.7 fL (ref 80.0–100.0)
MPV: 12.9 fL — ABNORMAL HIGH (ref 7.5–12.5)
Monocytes Relative: 8.4 %
Neutro Abs: 5640 cells/uL (ref 1500–7800)
Neutrophils Relative %: 70.5 %
Platelets: 199 10*3/uL (ref 140–400)
RBC: 5.08 10*6/uL (ref 3.80–5.10)
RDW: 13.1 % (ref 11.0–15.0)
Total Lymphocyte: 20.1 %
WBC: 8 10*3/uL (ref 3.8–10.8)

## 2019-09-18 LAB — COMPLETE METABOLIC PANEL WITH GFR
AG Ratio: 1.4 (calc) (ref 1.0–2.5)
ALT: 17 U/L (ref 6–29)
AST: 21 U/L (ref 10–35)
Albumin: 4.4 g/dL (ref 3.6–5.1)
Alkaline phosphatase (APISO): 94 U/L (ref 37–153)
BUN/Creatinine Ratio: 23 (calc) — ABNORMAL HIGH (ref 6–22)
BUN: 21 mg/dL (ref 7–25)
CO2: 22 mmol/L (ref 20–32)
Calcium: 9.5 mg/dL (ref 8.6–10.4)
Chloride: 106 mmol/L (ref 98–110)
Creat: 0.92 mg/dL — ABNORMAL HIGH (ref 0.60–0.88)
GFR, Est African American: 68 mL/min/{1.73_m2} (ref >=60)
GFR, Est Non African American: 58 mL/min/{1.73_m2} — ABNORMAL LOW (ref >=60)
Globulin: 3.2 g/dL (calc) (ref 1.9–3.7)
Glucose, Bld: 106 mg/dL — ABNORMAL HIGH (ref 65–99)
Potassium: 4 mmol/L (ref 3.5–5.3)
Sodium: 142 mmol/L (ref 135–146)
Total Bilirubin: 0.5 mg/dL (ref 0.2–1.2)
Total Protein: 7.6 g/dL (ref 6.1–8.1)

## 2019-09-18 LAB — RPR: RPR Ser Ql: NONREACTIVE

## 2019-09-18 LAB — SEDIMENTATION RATE: Sed Rate: 31 mm/h — ABNORMAL HIGH (ref 0–30)

## 2019-09-18 LAB — TSH: TSH: 2.31 mIU/L (ref 0.40–4.50)

## 2019-09-18 NOTE — Addendum Note (Signed)
Addended by: Beatrice Lecher D on: 09/18/2019 11:35 AM   Modules accepted: Orders

## 2019-09-21 ENCOUNTER — Telehealth: Payer: Self-pay

## 2019-09-21 ENCOUNTER — Other Ambulatory Visit: Payer: Self-pay

## 2019-09-21 ENCOUNTER — Ambulatory Visit (INDEPENDENT_AMBULATORY_CARE_PROVIDER_SITE_OTHER): Payer: Medicare HMO

## 2019-09-21 DIAGNOSIS — I4891 Unspecified atrial fibrillation: Secondary | ICD-10-CM

## 2019-09-21 DIAGNOSIS — R4182 Altered mental status, unspecified: Secondary | ICD-10-CM

## 2019-09-21 MED ORDER — GADOBUTROL 1 MMOL/ML IV SOLN
10.0000 mL | Freq: Once | INTRAVENOUS | Status: AC | PRN
  Administered 2019-09-21: 10 mL via INTRAVENOUS

## 2019-09-21 NOTE — Telephone Encounter (Signed)
Urgent referral needed for neuro. Subacute stroke. Stable. SX for 1 week.   09/23/19 [facility] arrive 1:00  Patient scheduled, please advise daughter when you speak with her, Dr Madilyn Fireman.   Jenny Reichmann, please go ahead and fax over last office note and MRI

## 2019-09-21 NOTE — Telephone Encounter (Signed)
Referral sent through epic they can see patient's  notes and imaging. - CF

## 2019-09-23 ENCOUNTER — Encounter: Payer: Self-pay | Admitting: Neurology

## 2019-09-23 ENCOUNTER — Ambulatory Visit: Payer: Medicare HMO | Admitting: Neurology

## 2019-09-23 ENCOUNTER — Other Ambulatory Visit: Payer: Self-pay

## 2019-09-23 VITALS — BP 161/85 | HR 85 | Temp 97.2°F | Ht 63.0 in | Wt 290.0 lb

## 2019-09-23 DIAGNOSIS — I6381 Other cerebral infarction due to occlusion or stenosis of small artery: Secondary | ICD-10-CM

## 2019-09-23 MED ORDER — ATORVASTATIN CALCIUM 40 MG PO TABS: 40.0000 mg | ORAL_TABLET | Freq: Every day | ORAL | 6 refills | Status: DC

## 2019-09-23 NOTE — Progress Notes (Signed)
[facility]    Provider:  Dr Jaynee Eagles Requesting Provider: [name], * Primary Care Provider:  [name], MD  CC:  Stroke  HPI:  Charlotte [name] is a 83 y.o. female here as requested by [name], * for stroke. PMHx abnormal glucose, LBBB, HTN, CAD, afib on Xarelto. Here with daughter who provides most information. Patient lives alone. Daughter noticed she was not talking much or elaborating. She would saw I don;t knot then say "oh me" very atypical behavior. She was going to the mailbox in 30 degree weather. Foggy headed.  Patient had no changes neurologically, no weakness, no sensory changes, no facial, but she was very confused. She had a complete workup and stroke was noticed. Unclear if this was incidental. Daughter says she took her medications. She was taking her Xarelto with food every evening. She stopped because of muscle weakness 10 years ago. Will start Lipitor. No other focal neurologic deficits, associated symptoms, inciting events or modifiable factors.  Reviewed notes, labs and imaging from outside physicians, which showed:  Personally reviewed MRI brain images and agree with the following: IMPRESSION: 1. Acute to subacute lacunar infarct of the left caudate nucleus. No associated hemorrhage or mass effect. 2. No other acute intracranial abnormality, and otherwise minimal to mild for age evidence of chronic small vessel ischemia. 3. A 2.4 cm chronic right middle cranial fossa meningioma is stable since 2018, and most likely clinically silent.  hgba1c 5.7 ldl 162  Review of Systems: Patient complains of symptoms per HPI as well as the following symptoms: stroke. Pertinent negatives and positives per HPI. All others negative.   Social History   Socioeconomic History  . Marital status: Divorced    Spouse name: Not on file  . Number of children: 1  . Years of education: Not on file  . Highest education level: High  school graduate  Occupational History  . Occupation: Retired    Fish farm manager: RETIRED  Tobacco Use  . Smoking status: Former Smoker    Quit date: 10/08/1970    Years since quitting: 49.0  . Smokeless tobacco: Never Used  Substance and Sexual Activity  . Alcohol use: No  . Drug use: No  . Sexual activity: Not on file  Other Topics Concern  . Not on file  Social History Narrative   Lives by herself at home   Right handed   Caffeine: 1 cup/day at the most. Recently switched to decaf coffee.   Social Determinants of Health   Financial Resource Strain:   . Difficulty of Paying Living Expenses: Not on file  Food Insecurity:   . Worried About Charity fundraiser in the Last Year: Not on file  . Ran Out of Food in the Last Year: Not on file  Transportation Needs:   . Lack of Transportation (Medical): Not on file  . Lack of Transportation (Non-Medical): Not on file  Physical Activity:   . Days of Exercise per Week: Not on file  . Minutes of Exercise per Session: Not on file  Stress:   . Feeling of Stress : Not on file  Social Connections:   . Frequency of Communication with Friends and Family: Not on file  . Frequency of Social Gatherings with Friends and Family: Not on file  . Attends Religious Services: Not on file  . Active Member of Clubs or Organizations: Not on file  . Attends Archivist Meetings: Not on file  . Marital Status: Not  on file  Intimate Partner Violence:   . Fear of Current or Ex-Partner: Not on file  . Emotionally Abused: Not on file  . Physically Abused: Not on file  . Sexually Abused: Not on file    Family History  Problem Relation Age of Onset  . Arrhythmia Mother        afib  . Alzheimer's disease Mother   . Stroke Mother   . Colon cancer Father   . Heart attack Father 61  . Diabetes Sister   . Arrhythmia Sister        afib  . Stroke Maternal Grandmother     Past Medical History:  Diagnosis Date  . Aortic insufficiency   . Atrial  fibrillation ([place])    a. s/p DCCV 03/2013  . CAD (coronary artery disease)    LHC 06/2010: EF 55%, mild plaque in the LAD 20-30%, otherwise normal coronary arteries  . CATARACTS   . Chronic systolic heart failure ([place])    in setting of AFib;  Echocardiogram 02/2013: Moderate LVH, EF 25-30%, anteroseptal and apical HK, moderate AI, MAC, moderate MR, moderate LAE, mild RAE, PASP 32 => after DCCV f/u Echo 8/14: Moderate LVH, EF 60-65%, normal wall motion, grade 1 diastolic dysfunction, moderate AI, moderate LAE  . Complication of anesthesia 2009   nausea and vomitting  . GERD (gastroesophageal reflux disease)   . HYPERLIPIDEMIA, MILD   . HYPERTENSION, BENIGN SYSTEMIC   . Irritable bowel syndrome   . LBBB (left bundle branch block)   . MEDIAL EPICONDYLITIS   . NSVT (nonsustained ventricular tachycardia) ([place])    during Dob Echo 2011 => normal cath  . OSTEOARTHRITIS   . OSTEOPENIA   . PONV (postoperative nausea and vomiting)   . Pre-diabetes   . RHINITIS, ALLERGIC   . Stroke ([place])   . SYNCOPE   . VITAMIN D DEFICIENCY     Patient Active Problem List   Diagnosis Date Noted  . Lacunar stroke ([place]) 09/28/2019  . Venous stasis 04/13/2019  . NICM (nonischemic cardiomyopathy) ([place]) 07/12/2017  . Low back pain without sciatica 06/05/2017  . Compression fracture of L3 lumbar vertebra 06/02/2017  . Low back pain 06/01/2017  . Falls 06/01/2017  . Intractable back pain 06/01/2017  . Arteriosclerosis of abdominal aorta ([place]) 05/30/2017  . Lumbosacral strain 05/29/2017  . Symptomatic anemia 05/21/2017  . Hyponatremia 05/21/2017  . Gastrointestinal hemorrhage   . Chronic constipation 05/10/2015  . Atrial fibrillation ([place]) 11/10/2014  . Severe obesity (BMI >= 40) ([place]) 06/18/2014  . Spinal stenosis of lumbar region without neurogenic claudication 03/30/2014  . Restless legs syndrome (RLS) 03/30/2014  . Idiopathic progressive polyneuropathy 03/30/2014  . Aortic regurgitation 03/03/2014  .  Diabetes mellitus type 2 in obese ([place]) 12/09/2013  . Long term current use of anticoagulant therapy 05/14/2013  . VITAMIN D DEFICIENCY 10/31/2010  . VENTRICULAR TACHYCARDIA 05/31/2010  . SYNCOPE 04/24/2010  . DYSPNEA 04/24/2010  . CATARACTS 04/20/2010  . EDEMA 02/28/2010  . ELECTROCARDIOGRAM, ABNORMAL 09/19/2007  . Osteoarthritis of both ankles 01/01/2007  . Hyperlipidemia 08/13/2006  . Essential hypertension 07/16/2006  . RHINITIS, ALLERGIC 07/16/2006  . GASTROESOPHAGEAL REFLUX, NO ESOPHAGITIS 07/16/2006  . IRRITABLE BOWEL SYNDROME 07/16/2006  . OSTEOPENIA 07/16/2006    Past Surgical History:  Procedure Laterality Date  . CARDIOVERSION N/A 04/01/2013   Procedure: CARDIOVERSION;  Surgeon: Josue Hector, MD;  Location: [facility] ENDOSCOPY;  Service: Cardiovascular;  Laterality: N/A;  . CARDIOVERSION N/A 07/16/2017   Procedure: CARDIOVERSION;  Surgeon: Debara Pickett,  Nadean Corwin, MD;  Location: [facility] ENDOSCOPY;  Service: Cardiovascular;  Laterality: N/A;  . CARDIOVERSION N/A 07/07/2018   Procedure: CARDIOVERSION;  Surgeon: Pixie Casino, MD;  Location: [place];  Service: Cardiovascular;  Laterality: N/A;  . CATARACT EXTRACTION, BILATERAL  2012   Dr. Herbert Deaner  . COLONOSCOPY WITH PROPOFOL N/A 05/23/2017   Procedure: COLONOSCOPY WITH PROPOFOL;  Surgeon: Wonda Horner, MD;  Location[facility] ENDOSCOPY;  Service: Endoscopy;  Laterality: N/A;  . ESOPHAGOGASTRODUODENOSCOPY (EGD) WITH PROPOFOL N/A 05/23/2017   Procedure: ESOPHAGOGASTRODUODENOSCOPY (EGD) WITH PROPOFOL;  Surgeon: Wonda Horner, MD;  Location: [facility] ENDOSCOPY;  Service: Endoscopy;  Laterality: N/A;  . IR KYPHO LUMBAR INC FX REDUCE BONE BX UNI/BIL CANNULATION INC/IMAGING  06/06/2017  . IR RADIOLOGIST EVAL & MGMT  07/01/2017  . RADICAL HYSTERECTOMY    . REPLACEMENT TOTAL KNEE     11/03/07 right....... 03/29/08 left    Current Outpatient Medications  Medication Sig Dispense Refill  . acetaminophen (TYLENOL) 500 MG tablet Take 1,000 mg by mouth 3  (three) times daily.    . fluticasone (FLONASE) 50 MCG/ACT nasal spray Place 1 spray into both nostrils daily. 16 g 1  . furosemide (LASIX) 20 MG tablet Take 1 tablet (20 mg total) by mouth 2 (two) times daily. 180 tablet 1  . losartan (COZAAR) 25 MG tablet Take 1 tablet (25 mg total) by mouth daily. 90 tablet 3  . polyvinyl alcohol-povidone (REFRESH) 1.4-0.6 % ophthalmic solution Place 1-2 drops into both eyes daily as needed (dry eyes).     . potassium chloride (K-DUR) 10 MEQ tablet Take 1 tablet (10 mEq total) by mouth 3 (three) times daily. 270 tablet 3  . pregabalin (LYRICA) 100 MG capsule Take 1 capsule (100 mg total) by mouth 2 (two) times daily. 180 capsule 1  . Psyllium (METAMUCIL PO) Take 5 mLs by mouth at bedtime as needed (constipation). Mix in 8 oz liquid and drink     . rivaroxaban (XARELTO) 20 MG TABS tablet Take 1 tablet (20 mg total) by mouth daily with supper. 90 tablet 2  . aspirin EC 81 MG tablet Take 1 tablet (81 mg total) by mouth daily. 30 tablet 0  . atorvastatin (LIPITOR) 40 MG tablet Take 1 tablet (40 mg total) by mouth daily. 90 tablet 6  . carvedilol (COREG) 12.5 MG tablet Take 1 tablet (12.5 mg total) by mouth 2 (two) times daily. 180 tablet 3   No current facility-administered medications for this visit.    Allergies as of 09/23/2019 - Review Complete 09/23/2019  Allergen Reaction Noted  . Metformin and related Other (See Comments) 08/26/2017  . Amlodipine Other (See Comments) 03/30/2014    Vitals: BP (!) 161/85 (BP Location: Right Arm, Patient Position: Sitting)   Pulse 85   Temp (!) 97.2 F (36.2 C) Comment: daughter 75  Ht _0  (1.6 m)   Wt 290 lb (131.5 kg)   BMI 51.37 kg/m  Last Weight:  Wt Readings from Last 1 Encounters:  09/23/19 290 lb (131.5 kg)   Last Height:   Ht Readings from Last 1 Encounters:  09/23/19 _1  (1.6 m)     Physical exam: Exam: Gen: NAD, conversant, well nourised, obese, well groomed                     CV: RRR, no  MRG. No Carotid Bruits. No peripheral edema, warm, nontender Eyes: Conjunctivae clear without exudates or hemorrhage  Neuro: Detailed Neurologic Exam  Speech:  Speech is normal; fluent and spontaneous with normal comprehension.  Cognition:    The patient is oriented to person, place, and time;     recent and remote memory intact;     language fluent;     normal attention, concentration,     fund of knowledge Cranial Nerves:    The pupils are equal, round, and reactive to light. The fundi are normal and spontaneous venous pulsations are present. Visual fields are full to finger confrontation. Extraocular movements are intact. Trigeminal sensation is intact and the muscles of mastication are normal. The face is symmetric. The palate elevates in the midline. Hearing intact. Voice is normal. Shoulder shrug is normal. The tongue has normal motion without fasciculations.   Coordination:    Normal finger to nose and heel to shin. Normal rapid alternating movements.   Gait:    Heel-toe and tandem gait are normal.   Motor Observation:    No asymmetry, no atrophy, and no involuntary movements noted. Tone:    Normal muscle tone.    Posture:    Posture is normal. normal erect    Strength:    Strength is V/V in the upper and lower limbs.      Sensation: intact to LT     Reflex Exam:  DTR's:    Deep tendon reflexes in the upper and lower extremities are normal bilaterally.   Toes:    The toes are downgoing bilaterally.   Clonus:    Clonus is absent.    Assessment/Plan:  Charlotte [name] is a 83 y.o. female here as requested by [name], * for stroke. PMHx abnormal glucose, LBBB, HTN, CAD, afib on Xarelto. Recent MRi with lacunar stroke. Discussed causing including her uncontrolled cholesterol. Needs thorough stroke evaluation. Start lipitor for any weakness stop and discuss praluent or repatha with cardiology. Start asa 10m, discussed increased risk of bleeding with  xarelto but given atherosclerosis needs asa 829mto prevent these types of strokes. Continue xarelto for prevention of embolic strokes.  Orders Placed This Encounter  Procedures  . CT ANGIO HEAD W OR WO CONTRAST  . CT ANGIO NECK W OR WO CONTRAST  . ECHOCARDIOGRAM COMPLETE BUBBLE STUDY   Meds ordered this encounter  Medications  . atorvastatin (LIPITOR) 40 MG tablet    Sig: Take 1 tablet (40 mg total) by mouth daily.    Dispense:  90 tablet    Refill:  6  . aspirin EC 81 MG tablet    Sig: Take 1 tablet (81 mg total) by mouth daily.    Dispense:  30 tablet    Refill:  0   I had a long d/w patient about her recent stroke, risk for recurrent stroke/TIAs, personally independently reviewed imaging studies and stroke evaluation results and answered questions.Continue ASA and Xarelto for secondary stroke prevention and maintain strict control of hypertension with blood pressure goal below 130/90, diabetes with hemoglobin A1c goal below 6.5% and lipids with LDL cholesterol goal below 70 mg/dL.Patient has h/o statin intolerance will try Lipitr but may discuss the new PCSK9 inhibitor like Praluent/Repatha with cardiology. I also advised the patient to eat a healthy diet with plenty of whole grains, cereals, fruits and vegetables, exercise regularly and maintain ideal body weight .  Cc: MeHali Marry*   AnSarina IllMD  [facility] [address]uMorrowrVilla HillsNC [address]Phone [phone]

## 2019-09-23 NOTE — Patient Instructions (Signed)
Continue Xarelto and Baby Aspirin CTA of the head and neck Echocardiogram (I will cardiologist)  Stroke Prevention Some medical conditions and lifestyle choices can lead to a higher risk for a stroke. You can help to prevent a stroke by making nutrition, lifestyle, and other changes. What nutrition changes can be made?   Eat healthy foods. ? Choose foods that are high in fiber. These include:  Fresh fruits.  Fresh vegetables.  Whole grains. ? Eat at least 5 or more servings of fruits and vegetables each day. Try to fill half of your plate at each meal with fruits and vegetables. ? Choose lean protein foods. These include:  Lowfat (lean) cuts of meat.  Chicken without skin.  Fish.  Tofu.  Beans.  Nuts. ? Eat low-fat dairy products. ? Avoid foods that:  Are high in salt (sodium).  Have saturated fat.  Have trans fat.  Have cholesterol.  Are processed.  Are premade.  Follow eating guidelines as told by your doctor. These may include: ? Reducing how many calories you eat and drink each day. ? Limiting how much salt you eat or drink each day to 1,500 milligrams (mg). ? Using only healthy fats for cooking. These include:  Olive oil.  Canola oil.  Sunflower oil. ? Counting how many carbohydrates you eat and drink each day. What lifestyle changes can be made?  Try to stay at a healthy weight. Talk to your doctor about what a good weight is for you.  Get at least 30 minutes of moderate physical activity at least 5 days a week. This can include: ? Fast walking. ? Biking. ? Swimming.  Do not use any products that have nicotine or tobacco. This includes cigarettes and e-cigarettes. If you need help quitting, ask your doctor. Avoid being around tobacco smoke in general.  Limit how much alcohol you drink to no more than 1 drink a day for nonpregnant women and 2 drinks a day for men. One drink equals 12 oz of beer, 5 oz of wine, or 1 oz of hard liquor.  Do not  use drugs.  Avoid taking birth control pills. Talk to your doctor about the risks of taking birth control pills if: ? You are over 43 years old. ? You smoke. ? You get migraines. ? You have had a blood clot. What other changes can be made?  Manage your cholesterol. ? It is important to eat a healthy diet. ? If your cholesterol cannot be managed through your diet, you may also need to take medicines. Take medicines as told by your doctor.  Manage your diabetes. ? It is important to eat a healthy diet and to exercise regularly. ? If your blood sugar cannot be managed through diet and exercise, you may need to take medicines. Take medicines as told by your doctor.  Control your high blood pressure (hypertension). ? Try to keep your blood pressure below 130/80. This can help lower your risk of stroke. ? It is important to eat a healthy diet and to exercise regularly. ? If your blood pressure cannot be managed through diet and exercise, you may need to take medicines. Take medicines as told by your doctor. ? Ask your doctor if you should check your blood pressure at home. ? Have your blood pressure checked every year. Do this even if your blood pressure is normal.  Talk to your doctor about getting checked for a sleep disorder. Signs of this can include: ? Snoring a lot. ? Feeling  very tired.  Take over-the-counter and prescription medicines only as told by your doctor. These may include aspirin or blood thinners (antiplatelets or anticoagulants).  Make sure that any other medical conditions you have are managed. Where to find more information  American Stroke Association: www.strokeassociation.org  National Stroke Association: www.stroke.org Get help right away if:  You have any symptoms of stroke. "BE FAST" is an easy way to remember the main warning signs: ? B - Balance. Signs are dizziness, sudden trouble walking, or loss of balance. ? E - Eyes. Signs are trouble seeing or a  sudden change in how you see. ? F - Face. Signs are sudden weakness or loss of feeling of the face, or the face or eyelid drooping on one side. ? A - Arms. Signs are weakness or loss of feeling in an arm. This happens suddenly and usually on one side of the body. ? S - Speech. Signs are sudden trouble speaking, slurred speech, or trouble understanding what people say. ? T - Time. Time to call emergency services. Write down what time symptoms started.  You have other signs of stroke, such as: ? A sudden, very bad headache with no known cause. ? Feeling sick to your stomach (nausea). ? Throwing up (vomiting). ? Jerky movements you cannot control (seizure). These symptoms may represent a serious problem that is an emergency. Do not wait to see if the symptoms will go away. Get medical help right away. Call your local emergency services (911 in the [place] Do not drive yourself to the hospital. Summary  You can prevent a stroke by eating healthy, exercising, not smoking, drinking less alcohol, and treating other health problems, such as diabetes, high blood pressure, or high cholesterol.  Do not use any products that contain nicotine or tobacco, such as cigarettes and e-cigarettes.  Get help right away if you have any signs or symptoms of a stroke. This information is not intended to replace advice given to you by your health care provider. Make sure you discuss any questions you have with your health care provider. Document Released: 03/25/2012 Document Revised: 11/20/2018 Document Reviewed: 12/26/2016 Elsevier Patient Education  2020 [employer]  Atorvastatin tablets What is this medicine? ATORVASTATIN (a TORE va sta tin) is known as a HMG-CoA reductase inhibitor or 'statin'. It lowers the level of cholesterol and triglycerides in the blood. This drug may also reduce the risk of heart attack, stroke, or other health problems in patients with risk factors for heart disease. Diet and lifestyle  changes are often used with this drug. This medicine may be used for other purposes; ask your health care provider or pharmacist if you have questions. COMMON BRAND NAME(S): Lipitor What should I tell my health care provider before I take this medicine? They need to know if you have any of these conditions:  diabetes  if you often drink alcohol  history of stroke  kidney disease  liver disease  muscle aches or weakness  thyroid disease  an unusual or allergic reaction to atorvastatin, other medicines, foods, dyes, or preservatives  pregnant or trying to get pregnant  breast-feeding How should I use this medicine? Take this medicine by mouth with a glass of water. Follow the directions on the prescription label. You can take it with or without food. If it upsets your stomach, take it with food. Do not take with grapefruit juice. Take your medicine at regular intervals. Do not take it more often than directed. Do not stop taking  except on your doctor's advice. Talk to your pediatrician regarding the use of this medicine in children. While this drug may be prescribed for children as young as 10 for selected conditions, precautions do apply. Overdosage: If you think you have taken too much of this medicine contact a poison control center or emergency room at once. NOTE: This medicine is only for you. Do not share this medicine with others. What if I miss a dose? If you miss a dose, take it as soon as you can. If your next dose is to be taken in less than 12 hours, then do not take the missed dose. Take the next dose at your regular time. Do not take double or extra doses. What may interact with this medicine? Do not take this medicine with any of the following medications:  dasabuvir; ombitasvir; paritaprevir; ritonavir  ombitasvir; paritaprevir; ritonavir  posaconazole  red yeast rice This medicine may also interact with the following medications:  alcohol  birth control  pills  certain antibiotics like erythromycin and clarithromycin  certain antivirals for HIV or hepatitis  certain medicines for cholesterol like fenofibrate, gemfibrozil, and niacin  certain medicines for fungal infections like ketoconazole and itraconazole  colchicine  cyclosporine  digoxin  grapefruit juice  rifampin This list may not describe all possible interactions. Give your health care provider a list of all the medicines, herbs, non-prescription drugs, or dietary supplements you use. Also tell them if you smoke, drink alcohol, or use illegal drugs. Some items may interact with your medicine. What should I watch for while using this medicine? Visit your doctor or health care professional for regular check-ups. You may need regular tests to make sure your liver is working properly. Your health care professional may tell you to stop taking this medicine if you develop muscle problems. If your muscle problems do not go away after stopping this medicine, contact your health care professional. Do not become pregnant while taking this medicine. Women should inform their health care professional if they wish to become pregnant or think they might be pregnant. There is a potential for serious side effects to an unborn child. Talk to your health care professional or pharmacist for more information. Do not breast-feed an infant while taking this medicine. This medicine may increase blood sugar. Ask your healthcare provider if changes in diet or medicines are needed if you have diabetes. If you are going to need surgery or other procedure, tell your doctor that you are using this medicine. This drug is only part of a total heart-health program. Your doctor or a dietician can suggest a low-cholesterol and low-fat diet to help. Avoid alcohol and smoking, and keep a proper exercise schedule. This medicine may cause a decrease in Co-Enzyme Q-10. You should make sure that you get enough Co-Enzyme  Q-10 while you are taking this medicine. Discuss the foods you eat and the vitamins you take with your health care professional. What side effects may I notice from receiving this medicine? Side effects that you should report to your doctor or health care professional as soon as possible:  allergic reactions like skin rash, itching or hives, swelling of the face, lips, or tongue  fever  joint pain  loss of memory  redness, blistering, peeling or loosening of the skin, including inside the mouth  signs and symptoms of high blood sugar such as being more thirsty or hungry or having to urinate more than normal. You may also feel very tired or have blurry  vision.  signs and symptoms of liver injury like dark yellow or brown urine; general ill feeling or flu-like symptoms; light-belly pain; unusually weak or tired; yellowing of the eyes or skin  signs and symptoms of muscle injury like dark urine; trouble passing urine or change in the amount of urine; unusually weak or tired; muscle pain or side or back pain Side effects that usually do not require medical attention (report to your doctor or health care professional if they continue or are bothersome):  diarrhea  nausea  stomach pain  trouble sleeping  upset stomach This list may not describe all possible side effects. Call your doctor for medical advice about side effects. You may report side effects to FDA at 1-800-FDA-1088. Where should I keep my medicine? Keep out of the reach of children. Store between 20 and 25 degrees C (68 and 77 degrees F). Throw away any unused medicine after the expiration date. NOTE: This sheet is a summary. It may not cover all possible information. If you have questions about this medicine, talk to your doctor, pharmacist, or health care provider.  2020 Elsevier/Gold Standard (2018-07-16 11:36:16)

## 2019-09-28 ENCOUNTER — Encounter: Payer: Self-pay | Admitting: Neurology

## 2019-09-28 DIAGNOSIS — I6381 Other cerebral infarction due to occlusion or stenosis of small artery: Secondary | ICD-10-CM | POA: Insufficient documentation

## 2019-09-28 MED ORDER — ASPIRIN EC 81 MG PO TBEC: 81.0000 mg | DELAYED_RELEASE_TABLET | Freq: Every day | ORAL | 0 refills | Status: DC

## 2019-10-08 ENCOUNTER — Ambulatory Visit (HOSPITAL_COMMUNITY): Payer: Medicare HMO | Attending: Cardiovascular Disease

## 2019-10-08 ENCOUNTER — Other Ambulatory Visit: Payer: Self-pay

## 2019-10-08 DIAGNOSIS — I6381 Other cerebral infarction due to occlusion or stenosis of small artery: Secondary | ICD-10-CM | POA: Diagnosis not present

## 2019-10-08 MED ORDER — PERFLUTREN LIPID MICROSPHERE
1.0000 mL | INTRAVENOUS | Status: AC | PRN
  Administered 2019-10-08: 2 mL via INTRAVENOUS

## 2019-10-27 ENCOUNTER — Encounter: Payer: Self-pay | Admitting: Family Medicine

## 2019-11-02 ENCOUNTER — Other Ambulatory Visit: Payer: Self-pay | Admitting: Nurse Practitioner

## 2019-11-24 ENCOUNTER — Other Ambulatory Visit (HOSPITAL_COMMUNITY): Payer: Self-pay | Admitting: Nurse Practitioner

## 2019-12-06 ENCOUNTER — Ambulatory Visit: Payer: Medicare HMO | Attending: Internal Medicine

## 2019-12-06 DIAGNOSIS — Z23 Encounter for immunization: Secondary | ICD-10-CM

## 2019-12-06 NOTE — Progress Notes (Signed)
   [facility]  Name:  Charlotte [name]    MRN: [id number] DOB: 1936/02/16  12/06/2019  Charlotte [name] was observed post Covid-19 immunization for 15 minutes without incidence. She was provided with Vaccine Information Sheet and instruction to access the V-Safe system.   Charlotte [name] was instructed to call 911 with any severe reactions post vaccine: Marland Kitchen Difficulty breathing  . Swelling of your face and throat  . A fast heartbeat  . A bad rash all over your body  . Dizziness and weakness    Immunizations Administered    Name Date Dose VIS Date Route   Moderna COVID-19 Vaccine 12/06/2019  9:15 AM 0.5 mL 09/08/2019 Intramuscular   Manufacturer: Moderna   Lot: [id number]   NDC: 83382-505-39

## 2019-12-20 ENCOUNTER — Other Ambulatory Visit: Payer: Self-pay | Admitting: Nurse Practitioner

## 2019-12-20 DIAGNOSIS — I4819 Other persistent atrial fibrillation: Secondary | ICD-10-CM

## 2019-12-22 ENCOUNTER — Ambulatory Visit: Payer: Medicare HMO | Admitting: Family Medicine

## 2019-12-25 ENCOUNTER — Ambulatory Visit: Payer: Medicare HMO | Admitting: Family Medicine

## 2019-12-27 ENCOUNTER — Other Ambulatory Visit: Payer: Self-pay | Admitting: Family Medicine

## 2019-12-28 NOTE — Telephone Encounter (Signed)
Scription filled for 1 month for her Lyrica.  And she is overdue to come back in to follow-up on her diabetes options and want to see how she is doing with her memory and mental status.  We may need to even consider decreasing the Lyrica if she is still having some sedation and mental fog. `

## 2019-12-29 ENCOUNTER — Encounter: Payer: Self-pay | Admitting: Family Medicine

## 2019-12-29 NOTE — Telephone Encounter (Signed)
Left message advising she does have a follow up in April but if she wanted to come in sooner she to call us back.

## 2019-12-30 MED ORDER — PREGABALIN 100 MG PO CAPS: 100.0000 mg | ORAL_CAPSULE | Freq: Two times a day (BID) | ORAL | 0 refills | Status: DC

## 2019-12-30 NOTE — Telephone Encounter (Signed)
Already sent twice. May have to call in a verbal.

## 2019-12-30 NOTE — Telephone Encounter (Signed)
RX sent to pharmacy on 12/28/2019 shows that transmission to the pharmacy failed.  Can you please resend the RX?  Tiajuana Amass, CMA

## 2019-12-31 ENCOUNTER — Telehealth: Payer: Self-pay | Admitting: Family Medicine

## 2019-12-31 NOTE — Telephone Encounter (Signed)
Received fax for [place] on Pregabalin 100 mg sent through cover my meds waiting on determination. - CF

## 2019-12-31 NOTE — Telephone Encounter (Signed)
Received fax from [facility] and they approved Pregabalin from 10/09/19 - 10/07/20. - CF

## 2019-12-31 NOTE — Telephone Encounter (Signed)
Rx went through the last time it was sent.

## 2020-01-07 ENCOUNTER — Ambulatory Visit (INDEPENDENT_AMBULATORY_CARE_PROVIDER_SITE_OTHER): Payer: Medicare HMO | Admitting: Family Medicine

## 2020-01-07 ENCOUNTER — Encounter: Payer: Self-pay | Admitting: Family Medicine

## 2020-01-07 VITALS — BP 137/86 | HR 60 | Temp 98.0°F | Ht 63.0 in | Wt 294.0 lb

## 2020-01-07 DIAGNOSIS — R600 Localized edema: Secondary | ICD-10-CM

## 2020-01-07 DIAGNOSIS — R351 Nocturia: Secondary | ICD-10-CM

## 2020-01-07 DIAGNOSIS — I10 Essential (primary) hypertension: Secondary | ICD-10-CM

## 2020-01-07 DIAGNOSIS — E1169 Type 2 diabetes mellitus with other specified complication: Secondary | ICD-10-CM

## 2020-01-07 DIAGNOSIS — G603 Idiopathic progressive neuropathy: Secondary | ICD-10-CM

## 2020-01-07 DIAGNOSIS — R350 Frequency of micturition: Secondary | ICD-10-CM

## 2020-01-07 DIAGNOSIS — E669 Obesity, unspecified: Secondary | ICD-10-CM

## 2020-01-07 DIAGNOSIS — I6381 Other cerebral infarction due to occlusion or stenosis of small artery: Secondary | ICD-10-CM | POA: Diagnosis not present

## 2020-01-07 LAB — POCT GLYCOSYLATED HEMOGLOBIN (HGB A1C): Hemoglobin A1C: 5.7 % — AB (ref 4.0–5.6)

## 2020-01-07 NOTE — Assessment & Plan Note (Signed)
A1c is phenomenal at 5.7 nice and stable.  Continue current regimen monitor for lows.  Plan to follow back up in 3 to 4 months.

## 2020-01-07 NOTE — Assessment & Plan Note (Signed)
Blood pressure well controlled overall she says she mostly gets 120s at home when she checks it today we got 137 for her systolic.  Continue current regimen.  Due to recheck renal function.

## 2020-01-07 NOTE — Assessment & Plan Note (Addendum)
She has had chronic intermittent edema since 2011 but has become more persistent since the summer.  Okay to try increasing furosemide to 40 mg daily to see if she feels like that is helpful she also need to double up on her potassium discussed this with her and her daughter today.  If is helpful then I can write a new prescription for furosemide 40 mg.  Also encouraged her to keep elevated.  She is unable to Syler compression stockings because she is not able to put them on by herself.  Reminded her about low-salt diet

## 2020-01-07 NOTE — Assessment & Plan Note (Signed)
She does feel like the Lyrica is helpful and did restart it 5 days ago.  Continue current regimen.  Consider that it could be contributing to some of her lower extremity swelling but she actually came off of it for 2 weeks and did not notice any improvement.

## 2020-01-07 NOTE — Progress Notes (Signed)
Established Patient Office Visit  Subjective:  Patient ID: Charlotte [name], female    DOB: 20-Jul-1936  Age: 84 y.o. MRN: [id number]  CC: No chief complaint on file.   HPI Charlotte [name] presents for   Follow-up of lower extremity edema.  She is still having some chronic swelling in her ankles that started and worsened last summer.  She really does not Carneal compression stockings because she says she cannot [place] get them on by herself.  She does try to keep her feet elevated throughout most of the day actually.  She says it gets to the point where it is uncomfortable particularly around her ankles.  She does take furosemide 20 mg once a day.  She was on it twice a day previously.  Follow-up lacunar stroke-when I last saw her she had had some mental status change and we actually ordered brain MRI which indicated an acute to subacute stroke and got her in with neurology.  She is now on Xarelto and doing well with that she was also started on Lipitor which she had taken previously and says thus far she is actually been doing well without any significant side effects.  She also ran out of her Lyrica for about 2 weeks.  She did not realize that she was no longer taking it and started to experience diarrhea.  Actually restarted about 5 days ago and says the diarrhea has improved.  Past Medical History:  Diagnosis Date  . Aortic insufficiency   . Atrial fibrillation ([place])    a. s/p DCCV 03/2013  . CAD (coronary artery disease)    LHC 06/2010: EF 55%, mild plaque in the LAD 20-30%, otherwise normal coronary arteries  . CATARACTS   . Chronic systolic heart failure ([place])    in setting of AFib;  Echocardiogram 02/2013: Moderate LVH, EF 25-30%, anteroseptal and apical HK, moderate AI, MAC, moderate MR, moderate LAE, mild RAE, PASP 32 => after DCCV f/u Echo 8/14: Moderate LVH, EF 60-65%, normal wall motion, grade 1 diastolic dysfunction, moderate AI, moderate LAE  . Complication of anesthesia 2009   nausea and  vomitting  . GERD (gastroesophageal reflux disease)   . HYPERLIPIDEMIA, MILD   . HYPERTENSION, BENIGN SYSTEMIC   . Irritable bowel syndrome   . LBBB (left bundle branch block)   . MEDIAL EPICONDYLITIS   . NSVT (nonsustained ventricular tachycardia) ([place])    during Dob Echo 2011 => normal cath  . OSTEOARTHRITIS   . OSTEOPENIA   . PONV (postoperative nausea and vomiting)   . Pre-diabetes   . RHINITIS, ALLERGIC   . Stroke ([place])   . SYNCOPE   . VITAMIN D DEFICIENCY     Past Surgical History:  Procedure Laterality Date  . CARDIOVERSION N/A 04/01/2013   Procedure: CARDIOVERSION;  Surgeon: Josue Hector, MD;  Location: [facility] ENDOSCOPY;  Service: Cardiovascular;  Laterality: N/A;  . CARDIOVERSION N/A 07/16/2017   Procedure: CARDIOVERSION;  Surgeon: Pixie Casino, MD;  Location: [facility] ENDOSCOPY;  Service: Cardiovascular;  Laterality: N/A;  . CARDIOVERSION N/A 07/07/2018   Procedure: CARDIOVERSION;  Surgeon: Pixie Casino, MD;  Location: [place];  Service: Cardiovascular;  Laterality: N/A;  . CATARACT EXTRACTION, BILATERAL  2012   Dr. Herbert Deaner  . COLONOSCOPY WITH PROPOFOL N/A 05/23/2017   Procedure: COLONOSCOPY WITH PROPOFOL;  Surgeon: Wonda Horner, MD;  Location: [facility] ENDOSCOPY;  Service: Endoscopy;  Laterality: N/A;  . ESOPHAGOGASTRODUODENOSCOPY (EGD) WITH PROPOFOL N/A 05/23/2017   Procedure: ESOPHAGOGASTRODUODENOSCOPY (EGD) WITH PROPOFOL;  Surgeon: Wonda Horner, MD;  Location: [facility] ENDOSCOPY;  Service: Endoscopy;  Laterality: N/A;  . IR KYPHO LUMBAR INC FX REDUCE BONE BX UNI/BIL CANNULATION INC/IMAGING  06/06/2017  . IR RADIOLOGIST EVAL & MGMT  07/01/2017  . RADICAL HYSTERECTOMY    . REPLACEMENT TOTAL KNEE     11/03/07 right....... 03/29/08 left    Family History  Problem Relation Age of Onset  . Arrhythmia Mother        afib  . Alzheimer's disease Mother   . Stroke Mother   . Colon cancer Father   . Heart attack Father 21  . Diabetes Sister   . Arrhythmia Sister        afib  .  Stroke Maternal Grandmother     Social History   Socioeconomic History  . Marital status: Divorced    Spouse name: Not on file  . Number of children: 1  . Years of education: Not on file  . Highest education level: High school graduate  Occupational History  . Occupation: Retired    Fish farm manager: RETIRED  Tobacco Use  . Smoking status: Former Smoker    Quit date: 10/08/1970    Years since quitting: 49.2  . Smokeless tobacco: Never Used  Substance and Sexual Activity  . Alcohol use: No  . Drug use: No  . Sexual activity: Not on file  Other Topics Concern  . Not on file  Social History Narrative   Lives by herself at home   Right handed   Caffeine: 1 cup/day at the most. Recently switched to decaf coffee.   Social Determinants of Health   Financial Resource Strain:   . Difficulty of Paying Living Expenses:   Food Insecurity:   . Worried About Charity fundraiser in the Last Year:   . Arboriculturist in the Last Year:   Transportation Needs:   . Film/video editor (Medical):   Marland Kitchen Lack of Transportation (Non-Medical):   Physical Activity:   . Days of Exercise per Week:   . Minutes of Exercise per Session:   Stress:   . Feeling of Stress :   Social Connections:   . Frequency of Communication with Friends and Family:   . Frequency of Social Gatherings with Friends and Family:   . Attends Religious Services:   . Active Member of Clubs or Organizations:   . Attends Archivist Meetings:   Marland Kitchen Marital Status:   Intimate Partner Violence:   . Fear of Current or Ex-Partner:   . Emotionally Abused:   Marland Kitchen Physically Abused:   . Sexually Abused:     Outpatient Medications Prior to Visit  Medication Sig Dispense Refill  . acetaminophen (TYLENOL) 500 MG tablet Take 1,000 mg by mouth 3 (three) times daily.    Marland Kitchen aspirin EC 81 MG tablet Take 1 tablet (81 mg total) by mouth daily. 30 tablet 0  . atorvastatin (LIPITOR) 40 MG tablet Take 1 tablet (40 mg total) by mouth  daily. 90 tablet 6  . carvedilol (COREG) 12.5 MG tablet Take 1 tablet (12.5 mg total) by mouth 2 (two) times daily. Appointment Required For Further Refills [phone] 60 tablet 0  . fluticasone (FLONASE) 50 MCG/ACT nasal spray Place 1 spray into both nostrils daily. 16 g 1  . furosemide (LASIX) 20 MG tablet Take 1 tablet (20 mg total) by mouth 2 (two) times daily. Appointment Required For Further Refills [phone] 60 tablet 0  . losartan (COZAAR) 25 MG tablet Take  1 tablet (25 mg total) by mouth daily. 90 tablet 3  . polyvinyl alcohol-povidone (REFRESH) 1.4-0.6 % ophthalmic solution Place 1-2 drops into both eyes daily as needed (dry eyes).     . potassium chloride (K-DUR) 10 MEQ tablet Take 1 tablet (10 mEq total) by mouth 3 (three) times daily. 270 tablet 3  . potassium chloride (KLOR-CON) 10 MEQ tablet Take 10 mEq by mouth 2 (two) times daily.    . pregabalin (LYRICA) 100 MG capsule Take 1 capsule (100 mg total) by mouth 2 (two) times daily. 60 capsule 0  . Psyllium (METAMUCIL PO) Take 5 mLs by mouth at bedtime as needed (constipation). Mix in 8 oz liquid and drink     . rivaroxaban (XARELTO) 20 MG TABS tablet Take 1 tablet (20 mg total) by mouth daily with supper. Appointment Required For Further Refills [phone] 90 tablet 0   No facility-administered medications prior to visit.    Allergies  Allergen Reactions  . Metformin And Related Other (See Comments)    Renal failure  . Amlodipine Other (See Comments)    HEADACHE    ROS Review of Systems    Objective:    Physical Exam  Constitutional: She is oriented to person, place, and time. She appears well-developed and well-nourished.  HENT:  Head: Normocephalic and atraumatic.  Cardiovascular: Normal rate, regular rhythm and normal heart sounds.  Pulmonary/Chest: Effort normal and breath sounds normal.  Neurological: She is alert and oriented to person, place, and time.  Skin: Skin is warm and dry.  1+ pitting edema  over her feet ankles and lower legs.  She has pretty significant varicose veins around her ankles and feet.  Psychiatric: She has a normal mood and affect. Her behavior is normal.    BP 137/86 (BP Location: Left Arm, Patient Position: Sitting, Cuff Size: Large)   Pulse 60   Temp 98 F (36.7 C) (Oral)   Ht _0  (1.6 m)   Wt 294 lb (133.4 kg)   SpO2 98%   BMI 52.08 kg/m  Wt Readings from Last 3 Encounters:  01/07/20 294 lb (133.4 kg)  09/23/19 290 lb (131.5 kg)  09/17/19 291 lb (132 kg)     Health Maintenance Due  Topic Date Due  . OPHTHALMOLOGY EXAM  12/08/2019    There are no preventive care reminders to display for this patient.  Lab Results  Component Value Date   TSH 2.31 09/17/2019   Lab Results  Component Value Date   WBC 8.0 09/17/2019   HGB 15.1 09/17/2019   HCT 46.1 (H) 09/17/2019   MCV 90.7 09/17/2019   PLT 199 09/17/2019   Lab Results  Component Value Date   NA 142 09/17/2019   K 4.0 09/17/2019   CO2 22 09/17/2019   GLUCOSE 106 (H) 09/17/2019   BUN 21 09/17/2019   CREATININE 0.92 (H) 09/17/2019   BILITOT 0.5 09/17/2019   ALKPHOS 73 03/19/2019   AST 21 09/17/2019   ALT 17 09/17/2019   PROT 7.6 09/17/2019   ALBUMIN 3.9 03/19/2019   CALCIUM 9.5 09/17/2019   ANIONGAP 9 03/19/2019   GFR 63.15 05/22/2013   Lab Results  Component Value Date   CHOL 239 (H) 08/14/2019   Lab Results  Component Value Date   HDL 50 08/14/2019   Lab Results  Component Value Date   LDLCALC 162 (H) 08/14/2019   Lab Results  Component Value Date   TRIG 148 08/14/2019   Lab Results  Component Value Date  CHOLHDL 4.8 08/14/2019   Lab Results  Component Value Date   HGBA1C 5.7 (A) 01/07/2020      Assessment & Plan:   Problem List Items Addressed This Visit      Cardiovascular and Mediastinum   Essential hypertension    Blood pressure well controlled overall she says she mostly gets 120s at home when she checks it today we got 976 for her systolic.   Continue current regimen.  Due to recheck renal function.      Relevant Orders   COMPLETE METABOLIC PANEL WITH GFR   Lipid panel   CBC   Urinalysis, Routine w reflex microscopic     Endocrine   Diabetes mellitus type 2 in obese (HCC) - Primary    A1c is phenomenal at 5.7 nice and stable.  Continue current regimen monitor for lows.  Plan to follow back up in 3 to 4 months.      Relevant Orders   POCT HgB A1C (Completed)   COMPLETE METABOLIC PANEL WITH GFR   Lipid panel   CBC   Urinalysis, Routine w reflex microscopic     Nervous and Auditory   Lacunar stroke ([place])   Idiopathic progressive polyneuropathy    She does feel like the Lyrica is helpful and did restart it 5 days ago.  Continue current regimen.  Consider that it could be contributing to some of her lower extremity swelling but she actually came off of it for 2 weeks and did not notice any improvement.      Relevant Orders   COMPLETE METABOLIC PANEL WITH GFR   Lipid panel   CBC   Urinalysis, Routine w reflex microscopic     Other   Edema    She has had chronic intermittent edema since 2011 but has become more persistent since the summer.  Okay to try increasing furosemide to 40 mg daily to see if she feels like that is helpful she also need to double up on her potassium discussed this with her and her daughter today.  If is helpful then I can write a new prescription for furosemide 40 mg.  Also encouraged her to keep elevated.  She is unable to Fichter compression stockings because she is not able to put them on by herself.  Reminded her about low-salt diet       Other Visit Diagnoses    Urinary frequency       Relevant Orders   Urinalysis, Routine w reflex microscopic     Urinary frequency-she also has urinary frequency.  I think this is probably from some overactive bladder in combination with taking a diuretic.  She would like to have her urine checked so we can certainly do that though she is not having any  dysuria or hematuria.  We did discuss that we could consider overactive bladder medications but they are not without the potential side effects but it is something for [place] to consider she is getting up to urinate about 3 times per night.  No orders of the defined types were placed in this encounter.   Follow-up: Return in about 3 months (around 04/07/2020) for Diabetes follow-up.    Beatrice Lecher, MD

## 2020-01-09 ENCOUNTER — Other Ambulatory Visit: Payer: Self-pay

## 2020-01-09 ENCOUNTER — Ambulatory Visit: Payer: Medicare HMO | Attending: Internal Medicine

## 2020-01-09 DIAGNOSIS — Z23 Encounter for immunization: Secondary | ICD-10-CM

## 2020-01-09 NOTE — Progress Notes (Signed)
   Covid-19 Vaccination Clinic  Name:  Charlotte [name]    MRN: [id number] DOB: July 28, 1936  01/09/2020  [name] was observed post Covid-19 immunization for 15 minutes without incident. She was provided with Vaccine Information Sheet and instruction to access the V-Safe system.   [name] was instructed to call 911 with any severe reactions post vaccine: Marland Kitchen Difficulty breathing  . Swelling of face and throat  . A fast heartbeat  . A bad rash all over body  . Dizziness and weakness   Immunizations Administered    Name Date Dose VIS Date Route   Moderna COVID-19 Vaccine 01/09/2020  9:21 AM 0.5 mL 09/08/2019 Intramuscular   Manufacturer: Moderna   Lot: [id number]   NDC: 75732-256-72

## 2020-01-13 DIAGNOSIS — H02834 Dermatochalasis of left upper eyelid: Secondary | ICD-10-CM | POA: Diagnosis not present

## 2020-01-13 DIAGNOSIS — H353131 Nonexudative age-related macular degeneration, bilateral, early dry stage: Secondary | ICD-10-CM | POA: Diagnosis not present

## 2020-01-13 DIAGNOSIS — Z961 Presence of intraocular lens: Secondary | ICD-10-CM | POA: Diagnosis not present

## 2020-01-13 DIAGNOSIS — H527 Unspecified disorder of refraction: Secondary | ICD-10-CM | POA: Diagnosis not present

## 2020-01-13 DIAGNOSIS — R350 Frequency of micturition: Secondary | ICD-10-CM | POA: Diagnosis not present

## 2020-01-13 DIAGNOSIS — E669 Obesity, unspecified: Secondary | ICD-10-CM | POA: Diagnosis not present

## 2020-01-13 DIAGNOSIS — G603 Idiopathic progressive neuropathy: Secondary | ICD-10-CM | POA: Diagnosis not present

## 2020-01-13 DIAGNOSIS — I10 Essential (primary) hypertension: Secondary | ICD-10-CM | POA: Diagnosis not present

## 2020-01-13 DIAGNOSIS — H02831 Dermatochalasis of right upper eyelid: Secondary | ICD-10-CM | POA: Diagnosis not present

## 2020-01-13 DIAGNOSIS — E1169 Type 2 diabetes mellitus with other specified complication: Secondary | ICD-10-CM | POA: Diagnosis not present

## 2020-01-13 DIAGNOSIS — E119 Type 2 diabetes mellitus without complications: Secondary | ICD-10-CM | POA: Diagnosis not present

## 2020-01-13 DIAGNOSIS — H43813 Vitreous degeneration, bilateral: Secondary | ICD-10-CM | POA: Diagnosis not present

## 2020-01-13 DIAGNOSIS — H04123 Dry eye syndrome of bilateral lacrimal glands: Secondary | ICD-10-CM | POA: Diagnosis not present

## 2020-01-13 LAB — HM DIABETES EYE EXAM

## 2020-01-14 LAB — CBC
HCT: 39.7 % (ref 35.0–45.0)
Hemoglobin: 13 g/dL (ref 11.7–15.5)
MCH: 30 pg (ref 27.0–33.0)
MCHC: 32.7 g/dL (ref 32.0–36.0)
MCV: 91.5 fL (ref 80.0–100.0)
MPV: 12.8 fL — ABNORMAL HIGH (ref 7.5–12.5)
Platelets: 206 10*3/uL (ref 140–400)
RBC: 4.34 10*6/uL (ref 3.80–5.10)
RDW: 13 % (ref 11.0–15.0)
WBC: 5.4 10*3/uL (ref 3.8–10.8)

## 2020-01-14 LAB — COMPLETE METABOLIC PANEL WITH GFR
AG Ratio: 1.3 (calc) (ref 1.0–2.5)
ALT: 15 U/L (ref 6–29)
AST: 16 U/L (ref 10–35)
Albumin: 3.9 g/dL (ref 3.6–5.1)
Alkaline phosphatase (APISO): 96 U/L (ref 37–153)
BUN/Creatinine Ratio: 29 (calc) — ABNORMAL HIGH (ref 6–22)
BUN: 30 mg/dL — ABNORMAL HIGH (ref 7–25)
CO2: 23 mmol/L (ref 20–32)
Calcium: 8.9 mg/dL (ref 8.6–10.4)
Chloride: 106 mmol/L (ref 98–110)
Creat: 1.02 mg/dL — ABNORMAL HIGH (ref 0.60–0.88)
GFR, Est African American: 59 mL/min/{1.73_m2} — ABNORMAL LOW (ref >=60)
GFR, Est Non African American: 51 mL/min/{1.73_m2} — ABNORMAL LOW (ref >=60)
Globulin: 2.9 g/dL (calc) (ref 1.9–3.7)
Glucose, Bld: 114 mg/dL — ABNORMAL HIGH (ref 65–99)
Potassium: 4.1 mmol/L (ref 3.5–5.3)
Sodium: 138 mmol/L (ref 135–146)
Total Bilirubin: 0.5 mg/dL (ref 0.2–1.2)
Total Protein: 6.8 g/dL (ref 6.1–8.1)

## 2020-01-14 LAB — URINALYSIS, ROUTINE W REFLEX MICROSCOPIC
Bilirubin Urine: NEGATIVE
Glucose, UA: NEGATIVE
Hyaline Cast: NONE SEEN /LPF
Ketones, ur: NEGATIVE
Nitrite: POSITIVE — AB
Protein, ur: NEGATIVE
Specific Gravity, Urine: 1.02 (ref 1.001–1.03)
pH: <=5 (ref 5.0–8.0)

## 2020-01-14 LAB — LIPID PANEL
Cholesterol: 126 mg/dL (ref <200)
HDL: 41 mg/dL — ABNORMAL LOW (ref >=50)
LDL Cholesterol (Calc): 70 mg/dL (calc)
Non-HDL Cholesterol (Calc): 85 mg/dL (calc) (ref <130)
Total CHOL/HDL Ratio: 3.1 (calc) (ref <5.0)
Triglycerides: 69 mg/dL (ref <150)

## 2020-01-15 ENCOUNTER — Other Ambulatory Visit: Payer: Self-pay | Admitting: Physician Assistant

## 2020-01-15 MED ORDER — NITROFURANTOIN MONOHYD MACRO 100 MG PO CAPS: 100.0000 mg | ORAL_CAPSULE | Freq: Two times a day (BID) | ORAL | 0 refills | Status: DC

## 2020-01-15 NOTE — Progress Notes (Signed)
[place],   My name is Tandy Gaw PA-C and I am filling in while Dr. Judie Petit is out of town. Kidney function up a little bit. Your urine also looked like it is infected. Did you get an antibiotic? I would like to start antibiotic due to bacteria found in urine. Start macrobid for 7 days. Recheck kidney function in 2 weeks to see if stable. Make sure to stay hydrated.   Cholesterol has improved significantly! LDL to goal of 70.  Keep moving to get HDL up to above 50.

## 2020-01-19 ENCOUNTER — Encounter: Payer: Self-pay | Admitting: Family Medicine

## 2020-01-24 ENCOUNTER — Other Ambulatory Visit: Payer: Self-pay | Admitting: Nurse Practitioner

## 2020-01-24 ENCOUNTER — Other Ambulatory Visit: Payer: Self-pay | Admitting: Family Medicine

## 2020-01-24 DIAGNOSIS — I4819 Other persistent atrial fibrillation: Secondary | ICD-10-CM

## 2020-01-25 ENCOUNTER — Telehealth (HOSPITAL_COMMUNITY): Payer: Self-pay

## 2020-01-25 NOTE — Telephone Encounter (Signed)
Received an refill request. Reached out to patient to schedule an appointment left voicemail to callback to schedule an appointment

## 2020-01-26 ENCOUNTER — Other Ambulatory Visit: Payer: Self-pay | Admitting: Family Medicine

## 2020-01-27 ENCOUNTER — Other Ambulatory Visit: Payer: Self-pay | Admitting: Nurse Practitioner

## 2020-01-27 ENCOUNTER — Other Ambulatory Visit: Payer: Self-pay | Admitting: Family Medicine

## 2020-01-27 ENCOUNTER — Encounter: Payer: Self-pay | Admitting: Family Medicine

## 2020-01-27 ENCOUNTER — Other Ambulatory Visit: Payer: Self-pay | Admitting: Cardiovascular Disease

## 2020-01-27 DIAGNOSIS — I480 Paroxysmal atrial fibrillation: Secondary | ICD-10-CM

## 2020-02-02 ENCOUNTER — Ambulatory Visit: Payer: Medicare HMO | Admitting: Family Medicine

## 2020-02-02 ENCOUNTER — Other Ambulatory Visit: Payer: Self-pay

## 2020-02-02 ENCOUNTER — Ambulatory Visit (INDEPENDENT_AMBULATORY_CARE_PROVIDER_SITE_OTHER): Payer: Medicare HMO | Admitting: Family Medicine

## 2020-02-02 ENCOUNTER — Encounter: Payer: Self-pay | Admitting: Family Medicine

## 2020-02-02 VITALS — BP 133/64 | HR 75 | Ht 63.0 in | Wt 298.0 lb

## 2020-02-02 DIAGNOSIS — I878 Other specified disorders of veins: Secondary | ICD-10-CM | POA: Diagnosis not present

## 2020-02-02 DIAGNOSIS — K625 Hemorrhage of anus and rectum: Secondary | ICD-10-CM | POA: Diagnosis not present

## 2020-02-02 DIAGNOSIS — R350 Frequency of micturition: Secondary | ICD-10-CM | POA: Diagnosis not present

## 2020-02-02 DIAGNOSIS — R600 Localized edema: Secondary | ICD-10-CM

## 2020-02-02 LAB — POCT URINALYSIS DIP (CLINITEK)
Bilirubin, UA: NEGATIVE
Glucose, UA: NEGATIVE mg/dL
Ketones, POC UA: NEGATIVE mg/dL
Nitrite, UA: POSITIVE — AB
POC PROTEIN,UA: 30 — AB
Spec Grav, UA: 1.025 (ref 1.010–1.025)
Urobilinogen, UA: 0.2 E.U./dL
pH, UA: 6 (ref 5.0–8.0)

## 2020-02-02 NOTE — Patient Instructions (Addendum)
Please limit your fluid to no more than 54 ounces a day this should be your 316 ounce bottles of water and your decaf cup of coffee.  Also make sure that you are really limiting any salt intake especially in processed foods and things like canned vegetables which can have a lot of salt.  Recommend low-salt versions and not adding while cooking and avoiding eating processed foods and meats.  Please try increasing your furosemide to 2 tabs in the morning.  It will increase urination and frequency but should really only last for about 4 to 6 hours and then will [place] off.  I want you to weigh yourself daily and write those down so that after a week we can see if you are hopefully at least down about 4 pounds.  If the diuretic is helping then we will just need to recheck your kidney function and potassium so just give [place] a call and let us know how it is working.

## 2020-02-02 NOTE — Assessment & Plan Note (Signed)
Stasis-continue to elevate periodically throughout the day she is unable to [place] compression stockings.  Again discussed that the really only minimal amount of things have gone able to really do for her she really needs to work on cutting back on salt intake limiting fluid to no more than 54 to 55 ounces a day.  And then also increase her Lasix to 2 tabs in the day.  We discussed that if she is on it consistently and really controlling her fluid then if occasionally she needs to skip a dose because she is out and about the knots fine but when she is already volume overloaded and then skips a dose it could actually cause significant symptoms including shortness of breath or chest pain.  She is here today with a friend so just encouraged her to really try this consistently for a week.  If it is working well and she is tolerating it well and her weight is down on her home scale then we can switch her to the 40 mg tabs we will just need to recheck her potassium and kidney function.

## 2020-02-02 NOTE — Progress Notes (Signed)
Established Patient Office Visit  Subjective:  Patient ID: Charlotte [name], female    DOB: [birth year]  Age: 84 y.o. MRN: [id number]  CC:  Chief Complaint  Patient presents with  . Edema    HPI Charlotte [name] presents for f/u urinary frequency.  She had complained of some increased frequency when I saw her about 4 weeks ago.  Urinalysis came back positive for infection.  She was placed on Macrobid and asked to come back in 2 weeks to follow up  Left lower extremity edema.  Also following up on her lower extremity edema.  She has had edema since 2011 but felt like it got worse starting last summer.  We decided to increase her furosemide to 2 tabs daily. She can't [place] compression stocking.    She also let me know that over the weekend she passed some blood after having a bowel movement.  She said she noticed it when she wiped.  She did not see it mixed in with the stool or dripping into the toilet she did not notice any bleeding into her underwear.  She says her stools have actually been a little bit softer than usual.  She has not noticed any blood in the last 2 days.  Last colonoscopy was August 2018.  Past Medical History:  Diagnosis Date  . Aortic insufficiency   . Atrial fibrillation ([place])    a. s/p DCCV 03/2013  . CAD (coronary artery disease)    LHC 06/2010: EF 55%, mild plaque in the LAD 20-30%, otherwise normal coronary arteries  . CATARACTS   . Chronic systolic heart failure ([place])    in setting of AFib;  Echocardiogram 02/2013: Moderate LVH, EF 25-30%, anteroseptal and apical HK, moderate AI, MAC, moderate MR, moderate LAE, mild RAE, PASP 32 => after DCCV f/u Echo 8/14: Moderate LVH, EF 60-65%, normal wall motion, grade 1 diastolic dysfunction, moderate AI, moderate LAE  . Complication of anesthesia 2009   nausea and vomitting  . GERD (gastroesophageal reflux disease)   . HYPERLIPIDEMIA, MILD   . HYPERTENSION, BENIGN SYSTEMIC   . Irritable bowel syndrome   . LBBB (left bundle  branch block)   . MEDIAL EPICONDYLITIS   . NSVT (nonsustained ventricular tachycardia) ([place])    during Dob Echo 2011 => normal cath  . OSTEOARTHRITIS   . OSTEOPENIA   . PONV (postoperative nausea and vomiting)   . Pre-diabetes   . RHINITIS, ALLERGIC   . Stroke ([place])   . SYNCOPE   . VITAMIN D DEFICIENCY     Past Surgical History:  Procedure Laterality Date  . CARDIOVERSION N/A 04/01/2013   Procedure: CARDIOVERSION;  Surgeon: Josue Hector, MD;  Location: [facility] ENDOSCOPY;  Service: Cardiovascular;  Laterality: N/A;  . CARDIOVERSION N/A 07/16/2017   Procedure: CARDIOVERSION;  Surgeon: Pixie Casino, MD;  Location: [facility] ENDOSCOPY;  Service: Cardiovascular;  Laterality: N/A;  . CARDIOVERSION N/A 07/07/2018   Procedure: CARDIOVERSION;  Surgeon: Pixie Casino, MD;  Location: [place];  Service: Cardiovascular;  Laterality: N/A;  . CATARACT EXTRACTION, BILATERAL  2012   Dr. Herbert Deaner  . COLONOSCOPY WITH PROPOFOL N/A 05/23/2017   Procedure: COLONOSCOPY WITH PROPOFOL;  Surgeon: Wonda Horner, MD;  Location: [facility] ENDOSCOPY;  Service: Endoscopy;  Laterality: N/A;  . ESOPHAGOGASTRODUODENOSCOPY (EGD) WITH PROPOFOL N/A 05/23/2017   Procedure: ESOPHAGOGASTRODUODENOSCOPY (EGD) WITH PROPOFOL;  Surgeon: Wonda Horner, MD;  Location: [facility] ENDOSCOPY;  Service: Endoscopy;  Laterality: N/A;  . IR KYPHO LUMBAR INC FX REDUCE  BONE BX UNI/BIL CANNULATION INC/IMAGING  06/06/2017  . IR RADIOLOGIST EVAL & MGMT  07/01/2017  . RADICAL HYSTERECTOMY    . REPLACEMENT TOTAL KNEE     11/03/07 right....... 03/29/08 left    Family History  Problem Relation Age of Onset  . Arrhythmia Mother        afib  . Alzheimer's disease Mother   . Stroke Mother   . Colon cancer Father   . Heart attack Father 88  . Diabetes Sister   . Arrhythmia Sister        afib  . Stroke Maternal Grandmother     Social History   Socioeconomic History  . Marital status: Divorced    Spouse name: Not on file  . Number of children: 1  .  Years of education: Not on file  . Highest education level: High school graduate  Occupational History  . Occupation: Retired    Fish farm manager: RETIRED  Tobacco Use  . Smoking status: Former Smoker    Quit date: 10/08/1970    Years since quitting: 49.3  . Smokeless tobacco: Never Used  Substance and Sexual Activity  . Alcohol use: No  . Drug use: No  . Sexual activity: Not on file  Other Topics Concern  . Not on file  Social History Narrative   Lives by herself at home   Right handed   Caffeine: 1 cup/day at the most. Recently switched to decaf coffee.   Social Determinants of Health   Financial Resource Strain:   . Difficulty of Paying Living Expenses:   Food Insecurity:   . Worried About Charity fundraiser in the Last Year:   . Arboriculturist in the Last Year:   Transportation Needs:   . Film/video editor (Medical):   Marland Kitchen Lack of Transportation (Non-Medical):   Physical Activity:   . Days of Exercise per Week:   . Minutes of Exercise per Session:   Stress:   . Feeling of Stress :   Social Connections:   . Frequency of Communication with Friends and Family:   . Frequency of Social Gatherings with Friends and Family:   . Attends Religious Services:   . Active Member of Clubs or Organizations:   . Attends Archivist Meetings:   Marland Kitchen Marital Status:   Intimate Partner Violence:   . Fear of Current or Ex-Partner:   . Emotionally Abused:   Marland Kitchen Physically Abused:   . Sexually Abused:     Outpatient Medications Prior to Visit  Medication Sig Dispense Refill  . acetaminophen (TYLENOL) 500 MG tablet Take 1,000 mg by mouth 3 (three) times daily.    Marland Kitchen aspirin EC 81 MG tablet Take 1 tablet (81 mg total) by mouth daily. 30 tablet 0  . atorvastatin (LIPITOR) 40 MG tablet Take 1 tablet (40 mg total) by mouth daily. 90 tablet 6  . carvedilol (COREG) 12.5 MG tablet Take 1 tablet (12.5 mg total) by mouth 2 (two) times daily with a meal. 60 tablet 4  . fluticasone (FLONASE)  50 MCG/ACT nasal spray PLACE 1 SPRAY INTO BOTH NOSTRILS DAILY. 16 mL 1  . furosemide (LASIX) 20 MG tablet TAKE 1 TABLET TWICE A DAY 60 tablet 4  . losartan (COZAAR) 25 MG tablet TAKE 1 TABLET BY MOUTH EVERY DAY 90 tablet 1  . polyvinyl alcohol-povidone (REFRESH) 1.4-0.6 % ophthalmic solution Place 1-2 drops into both eyes daily as needed (dry eyes).     . potassium chloride (KLOR-CON) 10  MEQ tablet TAKE 1 TABLET (10 MEQ TOTAL) BY MOUTH 2 (TWO) TIMES DAILY. 180 tablet 1  . pregabalin (LYRICA) 100 MG capsule TAKE 1 CAPSULE BY MOUTH TWICE A DAY 180 capsule 0  . Psyllium (METAMUCIL PO) Take 5 mLs by mouth at bedtime as needed (constipation). Mix in 8 oz liquid and drink     . RESTASIS 0.05 % ophthalmic emulsion     . XARELTO 20 MG TABS tablet TAKE 1 TABLET BY MOUTH DAILY WITH SUPPER. APPOINTMENT REQUIRED FOR FURTHER REFILLS [id number] 90 tablet 0  . nitrofurantoin, macrocrystal-monohydrate, (MACROBID) 100 MG capsule Take 1 capsule (100 mg total) by mouth 2 (two) times daily. 14 capsule 0  . potassium chloride (K-DUR) 10 MEQ tablet Take 1 tablet (10 mEq total) by mouth 3 (three) times daily. 270 tablet 3   No facility-administered medications prior to visit.    Allergies  Allergen Reactions  . Metformin And Related Other (See Comments)    Renal failure  . Amlodipine Other (See Comments)    HEADACHE    ROS Review of Systems    Objective:    Physical Exam  BP 133/64   Pulse 75   Ht _0  (1.6 m)   Wt 298 lb (135.2 kg)   SpO2 97%   BMI 52.79 kg/m  Wt Readings from Last 3 Encounters:  02/02/20 298 lb (135.2 kg)  01/07/20 294 lb (133.4 kg)  09/23/19 290 lb (131.5 kg)     There are no preventive care reminders to display for this patient.  There are no preventive care reminders to display for this patient.  Lab Results  Component Value Date   TSH 2.31 09/17/2019   Lab Results  Component Value Date   WBC 5.4 01/13/2020   HGB 13.0 01/13/2020   HCT 39.7 01/13/2020   MCV  91.5 01/13/2020   PLT 206 01/13/2020   Lab Results  Component Value Date   NA 138 01/13/2020   K 4.1 01/13/2020   CO2 23 01/13/2020   GLUCOSE 114 (H) 01/13/2020   BUN 30 (H) 01/13/2020   CREATININE 1.02 (H) 01/13/2020   BILITOT 0.5 01/13/2020   ALKPHOS 73 03/19/2019   AST 16 01/13/2020   ALT 15 01/13/2020   PROT 6.8 01/13/2020   ALBUMIN 3.9 03/19/2019   CALCIUM 8.9 01/13/2020   ANIONGAP 9 03/19/2019   GFR 63.15 05/22/2013   Lab Results  Component Value Date   CHOL 126 01/13/2020   Lab Results  Component Value Date   HDL 41 (L) 01/13/2020   Lab Results  Component Value Date   LDLCALC 70 01/13/2020   Lab Results  Component Value Date   TRIG 69 01/13/2020   Lab Results  Component Value Date   CHOLHDL 3.1 01/13/2020   Lab Results  Component Value Date   HGBA1C 5.7 (A) 01/07/2020      Assessment & Plan:   Problem List Items Addressed This Visit      Other   Stasis, venous    Stasis-continue to elevate periodically throughout the day she is unable to [place] compression stockings.  Again discussed that the really only minimal amount of things have gone able to really do for her she really needs to work on cutting back on salt intake limiting fluid to no more than 54 to 55 ounces a day.  And then also increase her Lasix to 2 tabs in the day.  We discussed that if she is on it consistently and really controlling her  fluid then if occasionally she needs to skip a dose because she is out and about the knots fine but when she is already volume overloaded and then skips a dose it could actually cause significant symptoms including shortness of breath or chest pain.  She is here today with a friend so just encouraged her to really try this consistently for a week.  If it is working well and she is tolerating it well and her weight is down on her home scale then we can switch her to the 40 mg tabs we will just need to recheck her potassium and kidney function.      Edema     Other Visit Diagnoses    Urinary frequency    -  Primary   Relevant Orders   POCT URINALYSIS DIP (CLINITEK)   Rectal bleeding       Morbid obesity ([place])         UTI -she did complete the Macrobid and says in the last couple days she actually has noticed improvement with urinary frequency.  No dysuria.  Unfortunately the urinalysis still looks positive for possible infection she was not able to give enough rest to send for culture so we will call her and have her collect another sample and drop that off for urine culture.  Rectal bleeding-it sounds like it was just with bowel movements and just with wiping over the weekend.  Does not Solik anything worrisome on recent colonoscopy 2 years ago was normal.  If it recurs then please let us know she is not having any belly cramping or pain with it.  And it seems to have resolved pretty quickly on its own.  No orders of the defined types were placed in this encounter.   Follow-up: Return in about 6 weeks (around 03/15/2020) for recheck legs.    Beatrice Lecher, MD

## 2020-02-03 ENCOUNTER — Encounter: Payer: Self-pay | Admitting: Family Medicine

## 2020-02-04 ENCOUNTER — Other Ambulatory Visit: Payer: Self-pay | Admitting: Family Medicine

## 2020-02-08 ENCOUNTER — Encounter: Payer: Self-pay | Admitting: Family Medicine

## 2020-02-08 DIAGNOSIS — R350 Frequency of micturition: Secondary | ICD-10-CM

## 2020-02-09 ENCOUNTER — Encounter: Payer: Self-pay | Admitting: Family Medicine

## 2020-02-09 NOTE — Telephone Encounter (Signed)
Did she ever drop off a sample?  We did a dipstick here in the office when she came in for the appointment but per notes she only gave [place] enough to actually do a dipstick we did not have enough for culture so she was supposed to bring this back an additional sample.  If she did drop it off it clearly was not run in which case it will need to be recollected as I do not see an order or result in the computer system.

## 2020-02-10 ENCOUNTER — Encounter: Payer: Self-pay | Admitting: Family Medicine

## 2020-02-15 ENCOUNTER — Encounter: Payer: Self-pay | Admitting: Family Medicine

## 2020-02-16 NOTE — Telephone Encounter (Signed)
This was error from [facility]. They are contacting patient to rectify.

## 2020-02-19 ENCOUNTER — Encounter: Payer: Self-pay | Admitting: Family Medicine

## 2020-02-22 ENCOUNTER — Other Ambulatory Visit: Payer: Self-pay | Admitting: Family Medicine

## 2020-02-22 DIAGNOSIS — R350 Frequency of micturition: Secondary | ICD-10-CM | POA: Diagnosis not present

## 2020-02-24 LAB — URINE CULTURE
MICRO NUMBER:: 10486657
SPECIMEN QUALITY:: ADEQUATE

## 2020-02-25 ENCOUNTER — Other Ambulatory Visit: Payer: Self-pay | Admitting: Cardiology

## 2020-02-25 ENCOUNTER — Encounter: Payer: Self-pay | Admitting: Family Medicine

## 2020-02-26 MED ORDER — SULFAMETHOXAZOLE-TRIMETHOPRIM 800-160 MG PO TABS: 1.0000 | ORAL_TABLET | Freq: Two times a day (BID) | ORAL | 0 refills | Status: DC

## 2020-02-29 ENCOUNTER — Encounter (HOSPITAL_COMMUNITY): Payer: Self-pay | Admitting: Internal Medicine

## 2020-02-29 ENCOUNTER — Inpatient Hospital Stay (HOSPITAL_COMMUNITY)
Admission: EM | Admit: 2020-02-29 | Discharge: 2020-03-02 | DRG: 378 | Disposition: A | Discharge status: Home Health | Payer: Medicare HMO | Attending: Family Medicine | Admitting: Family Medicine

## 2020-02-29 ENCOUNTER — Telehealth: Payer: Self-pay

## 2020-02-29 ENCOUNTER — Other Ambulatory Visit: Payer: Self-pay

## 2020-02-29 ENCOUNTER — Emergency Department (HOSPITAL_COMMUNITY): Payer: Medicare HMO

## 2020-02-29 DIAGNOSIS — K2100 Gastro-esophageal reflux disease with esophagitis, without bleeding: Secondary | ICD-10-CM | POA: Diagnosis present

## 2020-02-29 DIAGNOSIS — K254 Chronic or unspecified gastric ulcer with hemorrhage: Principal | ICD-10-CM | POA: Diagnosis present

## 2020-02-29 DIAGNOSIS — N390 Urinary tract infection, site not specified: Secondary | ICD-10-CM | POA: Diagnosis present

## 2020-02-29 DIAGNOSIS — Z800 Family history of malignant neoplasm of digestive organs: Secondary | ICD-10-CM | POA: Diagnosis not present

## 2020-02-29 DIAGNOSIS — B961 Klebsiella pneumoniae [K. pneumoniae] as the cause of diseases classified elsewhere: Secondary | ICD-10-CM | POA: Diagnosis present

## 2020-02-29 DIAGNOSIS — R55 Syncope and collapse: Secondary | ICD-10-CM | POA: Diagnosis not present

## 2020-02-29 DIAGNOSIS — Z20822 Contact with and (suspected) exposure to covid-19: Secondary | ICD-10-CM | POA: Diagnosis present

## 2020-02-29 DIAGNOSIS — I482 Chronic atrial fibrillation, unspecified: Secondary | ICD-10-CM | POA: Diagnosis present

## 2020-02-29 DIAGNOSIS — Z888 Allergy status to other drugs, medicaments and biological substances status: Secondary | ICD-10-CM | POA: Diagnosis not present

## 2020-02-29 DIAGNOSIS — Z820 Family history of epilepsy and other diseases of the nervous system: Secondary | ICD-10-CM | POA: Diagnosis not present

## 2020-02-29 DIAGNOSIS — K259 Gastric ulcer, unspecified as acute or chronic, without hemorrhage or perforation: Secondary | ICD-10-CM | POA: Diagnosis not present

## 2020-02-29 DIAGNOSIS — Z823 Family history of stroke: Secondary | ICD-10-CM | POA: Diagnosis not present

## 2020-02-29 DIAGNOSIS — Z96653 Presence of artificial knee joint, bilateral: Secondary | ICD-10-CM | POA: Diagnosis present

## 2020-02-29 DIAGNOSIS — D62 Acute posthemorrhagic anemia: Secondary | ICD-10-CM | POA: Diagnosis not present

## 2020-02-29 DIAGNOSIS — K3189 Other diseases of stomach and duodenum: Secondary | ICD-10-CM | POA: Diagnosis not present

## 2020-02-29 DIAGNOSIS — Z90710 Acquired absence of both cervix and uterus: Secondary | ICD-10-CM

## 2020-02-29 DIAGNOSIS — I959 Hypotension, unspecified: Secondary | ICD-10-CM | POA: Diagnosis not present

## 2020-02-29 DIAGNOSIS — D649 Anemia, unspecified: Secondary | ICD-10-CM

## 2020-02-29 DIAGNOSIS — R0902 Hypoxemia: Secondary | ICD-10-CM | POA: Diagnosis not present

## 2020-02-29 DIAGNOSIS — I110 Hypertensive heart disease with heart failure: Secondary | ICD-10-CM | POA: Diagnosis present

## 2020-02-29 DIAGNOSIS — Z8249 Family history of ischemic heart disease and other diseases of the circulatory system: Secondary | ICD-10-CM | POA: Diagnosis not present

## 2020-02-29 DIAGNOSIS — K922 Gastrointestinal hemorrhage, unspecified: Secondary | ICD-10-CM | POA: Diagnosis present

## 2020-02-29 DIAGNOSIS — Z7901 Long term (current) use of anticoagulants: Secondary | ICD-10-CM | POA: Diagnosis not present

## 2020-02-29 DIAGNOSIS — N179 Acute kidney failure, unspecified: Secondary | ICD-10-CM | POA: Diagnosis not present

## 2020-02-29 DIAGNOSIS — I2510 Atherosclerotic heart disease of native coronary artery without angina pectoris: Secondary | ICD-10-CM | POA: Diagnosis not present

## 2020-02-29 DIAGNOSIS — R0602 Shortness of breath: Secondary | ICD-10-CM | POA: Diagnosis not present

## 2020-02-29 DIAGNOSIS — E861 Hypovolemia: Secondary | ICD-10-CM | POA: Diagnosis present

## 2020-02-29 DIAGNOSIS — Z6841 Body Mass Index (BMI) 40.0 and over, adult: Secondary | ICD-10-CM

## 2020-02-29 DIAGNOSIS — E785 Hyperlipidemia, unspecified: Secondary | ICD-10-CM | POA: Diagnosis not present

## 2020-02-29 DIAGNOSIS — Z87891 Personal history of nicotine dependence: Secondary | ICD-10-CM

## 2020-02-29 DIAGNOSIS — Z7982 Long term (current) use of aspirin: Secondary | ICD-10-CM

## 2020-02-29 DIAGNOSIS — Z833 Family history of diabetes mellitus: Secondary | ICD-10-CM

## 2020-02-29 DIAGNOSIS — I10 Essential (primary) hypertension: Secondary | ICD-10-CM | POA: Diagnosis not present

## 2020-02-29 DIAGNOSIS — E559 Vitamin D deficiency, unspecified: Secondary | ICD-10-CM | POA: Diagnosis not present

## 2020-02-29 DIAGNOSIS — R531 Weakness: Secondary | ICD-10-CM | POA: Diagnosis not present

## 2020-02-29 DIAGNOSIS — I5022 Chronic systolic (congestive) heart failure: Secondary | ICD-10-CM | POA: Diagnosis not present

## 2020-02-29 DIAGNOSIS — R000 Tachycardia, unspecified: Secondary | ICD-10-CM | POA: Diagnosis not present

## 2020-02-29 DIAGNOSIS — J45909 Unspecified asthma, uncomplicated: Secondary | ICD-10-CM | POA: Diagnosis present

## 2020-02-29 DIAGNOSIS — Z79899 Other long term (current) drug therapy: Secondary | ICD-10-CM

## 2020-02-29 DIAGNOSIS — R4020 Unspecified coma: Secondary | ICD-10-CM | POA: Diagnosis not present

## 2020-02-29 DIAGNOSIS — Z8673 Personal history of transient ischemic attack (TIA), and cerebral infarction without residual deficits: Secondary | ICD-10-CM

## 2020-02-29 DIAGNOSIS — Z8719 Personal history of other diseases of the digestive system: Secondary | ICD-10-CM

## 2020-02-29 LAB — COMPREHENSIVE METABOLIC PANEL
ALT: 14 U/L (ref 0–44)
AST: 14 U/L — ABNORMAL LOW (ref 15–41)
Albumin: 3 g/dL — ABNORMAL LOW (ref 3.5–5.0)
Alkaline Phosphatase: 57 U/L (ref 38–126)
Anion gap: 10 (ref 5–15)
BUN: 99 mg/dL — ABNORMAL HIGH (ref 8–23)
CO2: 18 mmol/L — ABNORMAL LOW (ref 22–32)
Calcium: 8.8 mg/dL — ABNORMAL LOW (ref 8.9–10.3)
Chloride: 112 mmol/L — ABNORMAL HIGH (ref 98–111)
Creatinine, Ser: 1.84 mg/dL — ABNORMAL HIGH (ref 0.44–1.00)
GFR calc Af Amer: 29 mL/min — ABNORMAL LOW (ref >60)
GFR calc non Af Amer: 25 mL/min — ABNORMAL LOW (ref >60)
Glucose, Bld: 135 mg/dL — ABNORMAL HIGH (ref 70–99)
Potassium: 4.7 mmol/L (ref 3.5–5.1)
Sodium: 140 mmol/L (ref 135–145)
Total Bilirubin: 0.7 mg/dL (ref 0.3–1.2)
Total Protein: 5.7 g/dL — ABNORMAL LOW (ref 6.5–8.1)

## 2020-02-29 LAB — TROPONIN I (HIGH SENSITIVITY): Troponin I (High Sensitivity): 7 ng/L (ref <18)

## 2020-02-29 LAB — CBC WITH DIFFERENTIAL/PLATELET
Abs Immature Granulocytes: 0.13 10*3/uL — ABNORMAL HIGH (ref 0.00–0.07)
Basophils Absolute: 0 10*3/uL (ref 0.0–0.1)
Basophils Relative: 0 %
Eosinophils Absolute: 0 10*3/uL (ref 0.0–0.5)
Eosinophils Relative: 0 %
HCT: 22.3 % — ABNORMAL LOW (ref 36.0–46.0)
Hemoglobin: 6.9 g/dL — CL (ref 12.0–15.0)
Immature Granulocytes: 1 %
Lymphocytes Relative: 15 %
Lymphs Abs: 2.5 10*3/uL (ref 0.7–4.0)
MCH: 30.1 pg (ref 26.0–34.0)
MCHC: 30.9 g/dL (ref 30.0–36.0)
MCV: 97.4 fL (ref 80.0–100.0)
Monocytes Absolute: 0.9 10*3/uL (ref 0.1–1.0)
Monocytes Relative: 5 %
Neutro Abs: 13.1 10*3/uL — ABNORMAL HIGH (ref 1.7–7.7)
Neutrophils Relative %: 79 %
Platelets: 195 10*3/uL (ref 150–400)
RBC: 2.29 MIL/uL — ABNORMAL LOW (ref 3.87–5.11)
RDW: 14.7 % (ref 11.5–15.5)
WBC: 16.7 10*3/uL — ABNORMAL HIGH (ref 4.0–10.5)
nRBC: 0.5 % — ABNORMAL HIGH (ref 0.0–0.2)

## 2020-02-29 LAB — PROTIME-INR
INR: 3.7 — ABNORMAL HIGH (ref 0.8–1.2)
Prothrombin Time: 35.2 seconds — ABNORMAL HIGH (ref 11.4–15.2)

## 2020-02-29 LAB — PREPARE RBC (CROSSMATCH)

## 2020-02-29 LAB — HEMOGLOBIN AND HEMATOCRIT, BLOOD
HCT: 24.5 % — ABNORMAL LOW (ref 36.0–46.0)
Hemoglobin: 7.8 g/dL — ABNORMAL LOW (ref 12.0–15.0)

## 2020-02-29 LAB — BRAIN NATRIURETIC PEPTIDE: B Natriuretic Peptide: 72.2 pg/mL (ref 0.0–100.0)

## 2020-02-29 LAB — SARS CORONAVIRUS 2 BY RT PCR (HOSPITAL ORDER, PERFORMED IN ~~LOC~~ HOSPITAL LAB): SARS Coronavirus 2: NEGATIVE

## 2020-02-29 LAB — GLUCOSE, CAPILLARY: Glucose-Capillary: 108 mg/dL — ABNORMAL HIGH (ref 70–99)

## 2020-02-29 MED ORDER — ACETAMINOPHEN 325 MG PO TABS: 650.0000 mg | ORAL_TABLET | Freq: Four times a day (QID) | ORAL | Status: DC | PRN

## 2020-02-29 MED ORDER — POLYVINYL ALCOHOL 1.4 % OP SOLN
1.0000 [drp] | Freq: Every day | OPHTHALMIC | Status: DC | PRN
  Filled 2020-02-29: qty 15

## 2020-02-29 MED ORDER — IPRATROPIUM-ALBUTEROL 0.5-2.5 (3) MG/3ML IN SOLN: 3.0000 mL | Freq: Four times a day (QID) | RESPIRATORY_TRACT | Status: DC | PRN

## 2020-02-29 MED ORDER — IPRATROPIUM-ALBUTEROL 0.5-2.5 (3) MG/3ML IN SOLN
3.0000 mL | Freq: Four times a day (QID) | RESPIRATORY_TRACT | Status: DC
  Administered 2020-02-29 – 2020-03-01 (×2): 3 mL via RESPIRATORY_TRACT
  Filled 2020-02-29 (×2): qty 3

## 2020-02-29 MED ORDER — PSYLLIUM 95 % PO PACK: 1.0000 | PACK | Freq: Every evening | ORAL | Status: DC | PRN

## 2020-02-29 MED ORDER — SODIUM CHLORIDE 0.9 % IV SOLN: 10.0000 mL/h | Freq: Once | INTRAVENOUS | Status: DC

## 2020-02-29 MED ORDER — ATORVASTATIN CALCIUM 40 MG PO TABS
40.0000 mg | ORAL_TABLET | Freq: Every day | ORAL | Status: DC
  Administered 2020-03-01 – 2020-03-02 (×2): 40 mg via ORAL
  Filled 2020-02-29 (×2): qty 1

## 2020-02-29 MED ORDER — SODIUM CHLORIDE 0.9 % IV SOLN
1.0000 g | INTRAVENOUS | Status: DC
  Administered 2020-03-01: 1 g via INTRAVENOUS
  Filled 2020-02-29: qty 1
  Filled 2020-02-29: qty 10

## 2020-02-29 MED ORDER — FLUTICASONE PROPIONATE 50 MCG/ACT NA SUSP: 1.0000 | Freq: Every day | NASAL | Status: DC

## 2020-02-29 MED ORDER — POLYVINYL ALCOHOL-POVIDONE 1.4-0.6 % OP SOLN: 1.0000 [drp] | Freq: Every day | OPHTHALMIC | Status: DC | PRN

## 2020-02-29 MED ORDER — PREGABALIN 100 MG PO CAPS
100.0000 mg | ORAL_CAPSULE | Freq: Two times a day (BID) | ORAL | Status: DC
  Administered 2020-02-29 – 2020-03-02 (×4): 100 mg via ORAL
  Filled 2020-02-29 (×4): qty 1

## 2020-02-29 MED ORDER — SODIUM CHLORIDE 0.9 % IV SOLN
8.0000 mg/h | INTRAVENOUS | Status: DC
  Administered 2020-02-29 – 2020-03-01 (×3): 8 mg/h via INTRAVENOUS
  Filled 2020-02-29 (×3): qty 80

## 2020-02-29 MED ORDER — ACETAMINOPHEN 500 MG PO TABS
1000.0000 mg | ORAL_TABLET | Freq: Three times a day (TID) | ORAL | Status: DC
  Administered 2020-02-29 – 2020-03-02 (×4): 1000 mg via ORAL
  Filled 2020-02-29 (×5): qty 2

## 2020-02-29 MED ORDER — PANTOPRAZOLE SODIUM 40 MG IV SOLR
40.0000 mg | Freq: Once | INTRAVENOUS | Status: AC
  Administered 2020-02-29: 40 mg via INTRAVENOUS
  Filled 2020-02-29: qty 40

## 2020-02-29 MED ORDER — ACETAMINOPHEN 650 MG RE SUPP: 650.0000 mg | Freq: Four times a day (QID) | RECTAL | Status: DC | PRN

## 2020-02-29 MED ORDER — CYCLOSPORINE 0.05 % OP EMUL: 1.0000 [drp] | Freq: Two times a day (BID) | OPHTHALMIC | Status: DC

## 2020-02-29 NOTE — ED Provider Notes (Signed)
[facility] EMERGENCY DEPARTMENT Provider Note   CSN: [id number] Arrival date & time: 02/29/20  1402     History Chief Complaint  Patient presents with  . Loss of Consciousness  . Weakness    Charlotte [name] is a 84 y.o. female.  84yo F w/ PMH including A fib on Xarelto, CVA, PUD, obesity who p/w weakness and syncope. Patient felt weak couldn't get to the car; EMS was called. When they arrived and tried to stand her up and she passed out. She notes she has been feeling weak recently and has had 2 weeks of dark stool. She reports chronic SOB since she was diagnosed w/ A fib. She reports 1 episode of vomiting overnight. No cough or chest pain. No urinary symptoms.   Recent diagnosis of UTI, has 1 antibiotic pill left.   The history is provided by the patient.       Past Medical History:  Diagnosis Date  . Aortic insufficiency   . Atrial fibrillation ([place])    a. s/p DCCV 03/2013  . CAD (coronary artery disease)    LHC 06/2010: EF 55%, mild plaque in the LAD 20-30%, otherwise normal coronary arteries  . CATARACTS   . Chronic systolic heart failure ([place])    in setting of AFib;  Echocardiogram 02/2013: Moderate LVH, EF 25-30%, anteroseptal and apical HK, moderate AI, MAC, moderate MR, moderate LAE, mild RAE, PASP 32 => after DCCV f/u Echo 8/14: Moderate LVH, EF 60-65%, normal wall motion, grade 1 diastolic dysfunction, moderate AI, moderate LAE  . Complication of anesthesia 2009   nausea and vomitting  . GERD (gastroesophageal reflux disease)   . HYPERLIPIDEMIA, MILD   . HYPERTENSION, BENIGN SYSTEMIC   . Irritable bowel syndrome   . LBBB (left bundle branch block)   . MEDIAL EPICONDYLITIS   . NSVT (nonsustained ventricular tachycardia) ([place])    during Dob Echo 2011 => normal cath  . OSTEOARTHRITIS   . OSTEOPENIA   . PONV (postoperative nausea and vomiting)   . Pre-diabetes   . RHINITIS, ALLERGIC   . Stroke ([place])   . SYNCOPE   . VITAMIN D DEFICIENCY      Patient Active Problem List   Diagnosis Date Noted  . Nocturia 01/07/2020  . Lacunar stroke ([place]) 09/28/2019  . Stasis, venous 04/13/2019  . NICM (nonischemic cardiomyopathy) ([place]) 07/12/2017  . Low back pain without sciatica 06/05/2017  . Compression fracture of L3 lumbar vertebra 06/02/2017  . Low back pain 06/01/2017  . Falls 06/01/2017  . Intractable back pain 06/01/2017  . Arteriosclerosis of abdominal aorta ([place]) 05/30/2017  . Lumbosacral strain 05/29/2017  . Symptomatic anemia 05/21/2017  . Hyponatremia 05/21/2017  . Gastrointestinal hemorrhage   . Chronic constipation 05/10/2015  . Atrial fibrillation ([place]) 11/10/2014  . Severe obesity (BMI >= 40) ([place]) 06/18/2014  . Spinal stenosis of lumbar region without neurogenic claudication 03/30/2014  . Restless legs syndrome (RLS) 03/30/2014  . Idiopathic progressive polyneuropathy 03/30/2014  . Aortic regurgitation 03/03/2014  . Diabetes mellitus type 2 in obese ([place]) 12/09/2013  . Long term current use of anticoagulant therapy 05/14/2013  . VITAMIN D DEFICIENCY 10/31/2010  . VENTRICULAR TACHYCARDIA 05/31/2010  . SYNCOPE 04/24/2010  . DYSPNEA 04/24/2010  . CATARACTS 04/20/2010  . Edema 02/28/2010  . ELECTROCARDIOGRAM, ABNORMAL 09/19/2007  . Osteoarthritis of both ankles 01/01/2007  . Hyperlipidemia 08/13/2006  . Essential hypertension 07/16/2006  . RHINITIS, ALLERGIC 07/16/2006  . GASTROESOPHAGEAL REFLUX, NO ESOPHAGITIS 07/16/2006  . IRRITABLE BOWEL SYNDROME  07/16/2006  . OSTEOPENIA 07/16/2006    Past Surgical History:  Procedure Laterality Date  . CARDIOVERSION N/A 04/01/2013   Procedure: CARDIOVERSION;  Surgeon: Josue Hector, MD;  Location: [facility] ENDOSCOPY;  Service: Cardiovascular;  Laterality: N/A;  . CARDIOVERSION N/A 07/16/2017   Procedure: CARDIOVERSION;  Surgeon: Pixie Casino, MD;  Location: [facility] ENDOSCOPY;  Service: Cardiovascular;  Laterality: N/A;  . CARDIOVERSION N/A 07/07/2018   Procedure:  CARDIOVERSION;  Surgeon: Pixie Casino, MD;  Location: [place];  Service: Cardiovascular;  Laterality: N/A;  . CATARACT EXTRACTION, BILATERAL  2012   Dr. Herbert Deaner  . COLONOSCOPY WITH PROPOFOL N/A 05/23/2017   Procedure: COLONOSCOPY WITH PROPOFOL;  Surgeon: Wonda Horner, MD;  Location: [facility] ENDOSCOPY;  Service: Endoscopy;  Laterality: N/A;  . ESOPHAGOGASTRODUODENOSCOPY (EGD) WITH PROPOFOL N/A 05/23/2017   Procedure: ESOPHAGOGASTRODUODENOSCOPY (EGD) WITH PROPOFOL;  Surgeon: Wonda Horner, MD;  Location: [facility] ENDOSCOPY;  Service: Endoscopy;  Laterality: N/A;  . IR KYPHO LUMBAR INC FX REDUCE BONE BX UNI/BIL CANNULATION INC/IMAGING  06/06/2017  . IR RADIOLOGIST EVAL & MGMT  07/01/2017  . RADICAL HYSTERECTOMY    . REPLACEMENT TOTAL KNEE     11/03/07 right....... 03/29/08 left     OB History   No obstetric history on file.     Family History  Problem Relation Age of Onset  . Arrhythmia Mother        afib  . Alzheimer's disease Mother   . Stroke Mother   . Colon cancer Father   . Heart attack Father 65  . Diabetes Sister   . Arrhythmia Sister        afib  . Stroke Maternal Grandmother     Social History   Tobacco Use  . Smoking status: Former Smoker    Quit date: 10/08/1970    Years since quitting: 49.4  . Smokeless tobacco: Never Used  Substance Use Topics  . Alcohol use: No  . Drug use: No    Home Medications Prior to Admission medications   Medication Sig Start Date End Date Taking? Authorizing Provider  acetaminophen (TYLENOL) 500 MG tablet Take 1,000 mg by mouth 3 (three) times daily.    [provider]  aspirin EC 81 MG tablet Take 1 tablet (81 mg total) by mouth daily. 09/28/19   Melvenia Beam, MD  atorvastatin (LIPITOR) 40 MG tablet Take 1 tablet (40 mg total) by mouth daily. 09/23/19   Melvenia Beam, MD  carvedilol (COREG) 12.5 MG tablet Take 1 tablet (12.5 mg total) by mouth 2 (two) times daily with a meal. 01/25/20   Crenshaw, Denice Bors, MD  fluticasone  (FLONASE) 50 MCG/ACT nasal spray PLACE 1 SPRAY INTO BOTH NOSTRILS DAILY. 02/05/20   [name], MD  furosemide (LASIX) 20 MG tablet TAKE 1 TABLET TWICE A DAY 01/25/20   Lelon Perla, MD  losartan (COZAAR) 25 MG tablet TAKE 1 TABLET BY MOUTH EVERY DAY 01/27/20   Josue Hector, MD  polyvinyl alcohol-povidone (REFRESH) 1.4-0.6 % ophthalmic solution Place 1-2 drops into both eyes daily as needed (dry eyes).     [provider]  potassium chloride (KLOR-CON) 10 MEQ tablet TAKE 1 TABLET (10 MEQ TOTAL) BY MOUTH 2 (TWO) TIMES DAILY. 01/27/20   Josue Hector, MD  pregabalin (LYRICA) 100 MG capsule TAKE 1 CAPSULE BY MOUTH TWICE A DAY 01/25/20   [name], MD  Psyllium (METAMUCIL PO) Take 5 mLs by mouth at bedtime as needed (constipation). Mix in 8  oz liquid and drink     [provider]  RESTASIS 0.05 % ophthalmic emulsion  01/13/20   [provider]  rivaroxaban (XARELTO) 20 MG TABS tablet Take 1 tablet (20 mg total) by mouth daily with supper. 02/26/20   Lelon Perla, MD  sulfamethoxazole-trimethoprim (BACTRIM DS) 800-160 MG tablet Take 1 tablet by mouth 2 (two) times daily. 02/26/20   [name], MD    Allergies    Metformin and related and Amlodipine  Review of Systems   Review of Systems All other systems reviewed and are negative except that which was mentioned in HPI  Physical Exam Updated Vital Signs BP (!) 96/31   Pulse (!) 103   Temp 98.1 F (36.7 C) (Oral)   Resp 19   Ht _0  (1.6 m)   Wt 131.5 kg   SpO2 96%   BMI 51.37 kg/m   Physical Exam Vitals and nursing note reviewed.  Constitutional:      General: She is not in acute distress.    Appearance: She is well-developed.  HENT:     Head: Normocephalic and atraumatic.     Mouth/Throat:     Comments: Pale tongue Eyes:     Conjunctiva/sclera: Conjunctivae normal.     Pupils: Pupils are equal, round, and reactive to light.  Cardiovascular:     Rate and  Rhythm: Regular rhythm. Tachycardia present.     Heart sounds: Normal heart sounds. No murmur.  Pulmonary:     Effort: Pulmonary effort is normal.     Breath sounds: Normal breath sounds.  Abdominal:     General: Bowel sounds are normal. There is no distension.     Palpations: Abdomen is soft.     Tenderness: There is no abdominal tenderness.  Musculoskeletal:     Cervical back: Neck supple.     Right lower leg: Edema present.     Left lower leg: Edema present.  Skin:    General: Skin is warm and dry.     Coloration: Skin is pale.  Neurological:     Mental Status: She is alert and oriented to person, place, and time.     Comments: Fluent speech  Psychiatric:        Judgment: Judgment normal.     ED Results / Procedures / Treatments   Labs (all labs ordered are listed, but only abnormal results are displayed) Labs Reviewed  COMPREHENSIVE METABOLIC PANEL  CBC WITH DIFFERENTIAL/PLATELET  BRAIN NATRIURETIC PEPTIDE  TYPE AND SCREEN  TROPONIN I (HIGH SENSITIVITY)    EKG EKG Interpretation  Date/Time:  Monday Feb 29 2020 14:40:55 EDT Ventricular Rate:  104 PR Interval:    QRS Duration: 144 QT Interval:  389 QTC Calculation: 512 R Axis:   -47 Text Interpretation: Atrial fibrillation Left bundle branch block A fib new from previous sinus rhythm Confirmed by Theotis Burrow [phone]) on 02/29/2020 3:02:47 PM   Radiology DG Chest 2 View  Result Date: 02/29/2020 CLINICAL DATA:  Shortness of breath, atrial fibrillation, dyspnea on exertion EXAM: CHEST - 2 VIEW COMPARISON:  07/13/2017 FINDINGS: Frontal and lateral views of the chest are obtained. The cardiac silhouette is unremarkable. No airspace disease, effusion, or pneumothorax. No acute bony abnormalities. IMPRESSION: 1. No acute intrathoracic process. Electronically Signed   By: Randa Ngo M.D.   On: 02/29/2020 15:42    Procedures Procedures (including critical care time)  Medications Ordered in ED Medications - No  data to display  ED Course  I  have reviewed the triage vital signs and the nursing notes.  Pertinent labs & imaging results that were available during my care of the patient were reviewed by me and considered in my medical decision making (see chart for details).    MDM Rules/Calculators/A&P                      Pt alert, mentating appropriately on arrival w/ stable VS. Concern for GI bleed based on report of dark stools and anticoagulant use. CXR clear. Labwork pending, pt signed out pending completion of labs. Final Clinical Impression(s) / ED Diagnoses Final diagnoses:  None    Rx / DC Orders ED Discharge Orders    None       Tiea Manninen, Wenda Overland, MD 02/29/20 1549

## 2020-02-29 NOTE — Progress Notes (Signed)
Report received. Room is ready.  

## 2020-02-29 NOTE — H&P (Signed)
History and Physical    [name] CWC:[id number] DOB: [birth year] DOA: 02/29/2020  PCP: [name], MD (Confirm with patient/family/NH records and if not entered, this has to be entered at [facility] point of entry) Patient coming from: Home I have personally briefly reviewed patient's old medical records in [place]  Chief Complaint: I Passed out   HPI: Charlotte [name] is a 84 y.o. female with medical history significant of recent diagnosis peptic ulcer off PPI, chronic A. fib on Xarelto, CAD, chronic systolic CHF on Lasix, presented with black tarry stool and syncope.  Patient has episode of black tarry stools last month, underwent EGD which showed healing peptic ulcer and patient was prescribed with 1 week of PPI which she completed about 3 weeks ago.  Since about 2 weeks ago she started to pass black tarry stool again she did not pay much attention and did not seek medical attention.  She described as black tarry stools and, once a day amount has been consistent.  She started to have feeling of weakness and shortness of breath since yesterday and this morning she passed out while trying to stand up.  She denied any chest pain, no abdominal pain no nauseous vomit.  No fever chills.  ED Course: Hb 6.9 compared to 13, 1 month ago.  Review of Systems: As per HPI otherwise 10 point review of systems negative.    Past Medical History:  Diagnosis Date  . Aortic insufficiency   . Atrial fibrillation ([place])    a. s/p DCCV 03/2013  . CAD (coronary artery disease)    LHC 06/2010: EF 55%, mild plaque in the LAD 20-30%, otherwise normal coronary arteries  . CATARACTS   . Chronic systolic heart failure ([place])    in setting of AFib;  Echocardiogram 02/2013: Moderate LVH, EF 25-30%, anteroseptal and apical HK, moderate AI, MAC, moderate MR, moderate LAE, mild RAE, PASP 32 => after DCCV f/u Echo 8/14: Moderate LVH, EF 60-65%, normal wall motion, grade 1 diastolic dysfunction, moderate AI, moderate  LAE  . Complication of anesthesia 2009   nausea and vomitting  . GERD (gastroesophageal reflux disease)   . HYPERLIPIDEMIA, MILD   . HYPERTENSION, BENIGN SYSTEMIC   . Irritable bowel syndrome   . LBBB (left bundle branch block)   . MEDIAL EPICONDYLITIS   . NSVT (nonsustained ventricular tachycardia) ([place])    during Dob Echo 2011 => normal cath  . OSTEOARTHRITIS   . OSTEOPENIA   . PONV (postoperative nausea and vomiting)   . Pre-diabetes   . RHINITIS, ALLERGIC   . Stroke ([place])   . SYNCOPE   . VITAMIN D DEFICIENCY     Past Surgical History:  Procedure Laterality Date  . CARDIOVERSION N/A 04/01/2013   Procedure: CARDIOVERSION;  Surgeon: Josue Hector, MD;  Location: [facility] ENDOSCOPY;  Service: Cardiovascular;  Laterality: N/A;  . CARDIOVERSION N/A 07/16/2017   Procedure: CARDIOVERSION;  Surgeon: Pixie Casino, MD;  Location[facility] ENDOSCOPY;  Service: Cardiovascular;  Laterality: N/A;  . CARDIOVERSION N/A 07/07/2018   Procedure: CARDIOVERSION;  Surgeon: Pixie Casino, MD;  Location: [place];  Service: Cardiovascular;  Laterality: N/A;  . CATARACT EXTRACTION, BILATERAL  2012   Dr. Herbert Deaner  . COLONOSCOPY WITH PROPOFOL N/A 05/23/2017   Procedure: COLONOSCOPY WITH PROPOFOL;  Surgeon: Wonda Horner, MD;  Location: [facility] ENDOSCOPY;  Service: Endoscopy;  Laterality: N/A;  . ESOPHAGOGASTRODUODENOSCOPY (EGD) WITH PROPOFOL N/A 05/23/2017   Procedure: ESOPHAGOGASTRODUODENOSCOPY (EGD) WITH PROPOFOL;  Surgeon: Penelope Coop,  Kenney Houseman, MD;  Location: [facility] ENDOSCOPY;  Service: Endoscopy;  Laterality: N/A;  . IR KYPHO LUMBAR INC FX REDUCE BONE BX UNI/BIL CANNULATION INC/IMAGING  06/06/2017  . IR RADIOLOGIST EVAL & MGMT  07/01/2017  . RADICAL HYSTERECTOMY    . REPLACEMENT TOTAL [name]     11/03/07 right....... 03/29/08 left     reports that she quit smoking about 49 years ago. She has never used smokeless tobacco. She reports that she does not drink alcohol or use drugs.  Allergies  Allergen Reactions  .  Metformin And Related Other (See Comments)    Renal failure  . Amlodipine Other (See Comments)    HEADACHE    Family History  Problem Relation Age of Onset  . Arrhythmia Mother        afib  . Alzheimer's disease Mother   . Stroke Mother   . Colon cancer Father   . Heart attack Father 61  . Diabetes Sister   . Arrhythmia Sister        afib  . Stroke Maternal Grandmother      Prior to Admission medications   Medication Sig Start Date End Date Taking? Authorizing Provider  acetaminophen (TYLENOL) 500 MG tablet Take 1,000 mg by mouth 3 (three) times daily.    [provider]  aspirin EC 81 MG tablet Take 1 tablet (81 mg total) by mouth daily. 09/28/19   Melvenia Beam, MD  atorvastatin (LIPITOR) 40 MG tablet Take 1 tablet (40 mg total) by mouth daily. 09/23/19   Melvenia Beam, MD  carvedilol (COREG) 12.5 MG tablet Take 1 tablet (12.5 mg total) by mouth 2 (two) times daily with a meal. 01/25/20   Crenshaw, Denice Bors, MD  fluticasone (FLONASE) 50 MCG/ACT nasal spray PLACE 1 SPRAY INTO BOTH NOSTRILS DAILY. 02/05/20   [name], MD  furosemide (LASIX) 20 MG tablet TAKE 1 TABLET TWICE A DAY 01/25/20   Lelon Perla, MD  losartan (COZAAR) 25 MG tablet TAKE 1 TABLET BY MOUTH EVERY DAY 01/27/20   Josue Hector, MD  polyvinyl alcohol-povidone (REFRESH) 1.4-0.6 % ophthalmic solution Place 1-2 drops into both eyes daily as needed (dry eyes).     [provider]  potassium chloride (KLOR-CON) 10 MEQ tablet TAKE 1 TABLET (10 MEQ TOTAL) BY MOUTH 2 (TWO) TIMES DAILY. 01/27/20   Josue Hector, MD  pregabalin (LYRICA) 100 MG capsule TAKE 1 CAPSULE BY MOUTH TWICE A DAY 01/25/20   [name], MD  Psyllium (METAMUCIL PO) Take 5 mLs by mouth at bedtime as needed (constipation). Mix in 8 oz liquid and drink     [provider]  RESTASIS 0.05 % ophthalmic emulsion  01/13/20   [provider]  rivaroxaban (XARELTO) 20 MG TABS tablet Take 1 tablet  (20 mg total) by mouth daily with supper. 02/26/20   Lelon Perla, MD  sulfamethoxazole-trimethoprim (BACTRIM DS) 800-160 MG tablet Take 1 tablet by mouth 2 (two) times daily. 02/26/20   [name], MD    Physical Exam: Vitals:   02/29/20 1430 02/29/20 1433 02/29/20 1438 02/29/20 1443  BP: (!) 96/31     Pulse: (!) 103     Resp: 19     Temp:    98.1 F (36.7 C)  TempSrc:    Oral  SpO2: 100%  96%   Weight:  131.5 kg    Height:  _0  (1.6 m)      Constitutional: NAD, calm, comfortable Vitals:  02/29/20 1430 02/29/20 1433 02/29/20 1438 02/29/20 1443  BP: (!) 96/31     Pulse: (!) 103     Resp: 19     Temp:    98.1 F (36.7 C)  TempSrc:    Oral  SpO2: 100%  96%   Weight:  131.5 kg    Height:  _0  (1.6 m)     Eyes: PERRL, lids and conjunctivae normal ENMT: Mucous membranes are dry. Posterior pharynx clear of any exudate or lesions.Normal dentition.  Neck: normal, supple, no masses, no thyromegaly Respiratory: clear to auscultation bilaterally, scattered wheezing no crackles. Normal respiratory effort. No accessory muscle use.  Cardiovascular: Regular rate and rhythm, no murmurs / rubs / gallops. 1+ extremity edema. 2+ pedal pulses. No carotid bruits.  Abdomen: no tenderness, no masses palpated. No hepatosplenomegaly. Bowel sounds positive.  Musculoskeletal: no clubbing / cyanosis. No joint deformity upper and lower extremities. Good ROM, no contractures. Normal muscle tone.  Skin: no rashes, lesions, ulcers. No induration Neurologic: CN 2-12 grossly intact. Sensation intact, DTR normal. Strength 5/5 in all 4.  Psychiatric: Normal judgment and insight. Alert and oriented x 3. Normal mood.    Labs on Admission: I have personally reviewed following labs and imaging studies  CBC: Recent Labs  Lab 02/29/20 1518  WBC 16.7*  NEUTROABS 13.1*  HGB 6.9*  HCT 22.3*  MCV 97.4  PLT 573   Basic Metabolic Panel: Recent Labs  Lab 02/29/20 1518  NA 140  K 4.7    CL 112*  CO2 18*  GLUCOSE 135*  BUN 99*  CREATININE 1.84*  CALCIUM 8.8*   GFR: Estimated Creatinine Clearance: 31.3 mL/min (A) (by C-G formula based on SCr of 1.84 mg/dL (H)). Liver Function Tests: Recent Labs  Lab 02/29/20 1518  AST 14*  ALT 14  ALKPHOS 57  BILITOT 0.7  PROT 5.7*  ALBUMIN 3.0*   No results for input(s): LIPASE, AMYLASE in the last 168 hours. No results for input(s): AMMONIA in the last 168 hours. Coagulation Profile: No results for input(s): INR, PROTIME in the last 168 hours. Cardiac Enzymes: No results for input(s): CKTOTAL, CKMB, CKMBINDEX, TROPONINI in the last 168 hours. BNP (last 3 results) No results for input(s): PROBNP in the last 8760 hours. HbA1C: No results for input(s): HGBA1C in the last 72 hours. CBG: No results for input(s): GLUCAP in the last 168 hours. Lipid Profile: No results for input(s): CHOL, HDL, LDLCALC, TRIG, CHOLHDL, LDLDIRECT in the last 72 hours. Thyroid Function Tests: No results for input(s): TSH, T4TOTAL, FREET4, T3FREE, THYROIDAB in the last 72 hours. Anemia Panel: No results for input(s): VITAMINB12, FOLATE, FERRITIN, TIBC, IRON, RETICCTPCT in the last 72 hours. Urine analysis:    Component Value Date/Time   COLORURINE YELLOW 01/13/2020 0914   APPEARANCEUR CLOUDY (A) 01/13/2020 0914   LABSPEC 1.020 01/13/2020 0914   PHURINE < OR = 5.0 01/13/2020 0914   GLUCOSEU NEGATIVE 01/13/2020 0914   HGBUR 3+ (A) 01/13/2020 0914   BILIRUBINUR negative 02/02/2020 1618   KETONESUR negative 02/02/2020 1618   KETONESUR NEGATIVE 01/13/2020 0914   PROTEINUR NEGATIVE 01/13/2020 0914   UROBILINOGEN 0.2 02/02/2020 1618   UROBILINOGEN 0.2 03/23/2008 1050   NITRITE Positive (A) 02/02/2020 1618   NITRITE POSITIVE (A) 01/13/2020 0914   LEUKOCYTESUR Small (1+) (A) 02/02/2020 1618   LEUKOCYTESUR 2+ (A) 01/13/2020 0914    Radiological Exams on Admission: DG Chest 2 View  Result Date: 02/29/2020 CLINICAL DATA:  Shortness of breath,  atrial fibrillation, dyspnea  on exertion EXAM: CHEST - 2 VIEW COMPARISON:  07/13/2017 FINDINGS: Frontal and lateral views of the chest are obtained. The cardiac silhouette is unremarkable. No airspace disease, effusion, or pneumothorax. No acute bony abnormalities. IMPRESSION: 1. No acute intrathoracic process. Electronically Signed   By: Randa Ngo M.D.   On: 02/29/2020 15:42    EKG: Independently reviewed. A-fib  Assessment/Plan Active Problems:   * No active hospital problems. *  Syncope secondary symptomatic anemia -Blood pressure borderline low, hold all BP meds -Patient is receiving PRBC, recheck Hb tonight  Probably upper GI bleed -[facility] informed, patient on PPI drip, EGD as per GI service -N.p.o. IV fluids  AKI -She is hypovolemic, IV fluids and recheck BMP in the morning  Klebsiella UTI -She is getting Bactrim since Friday, given her kidney function will switch to ceftriaxone for another 3 days to complete a 5 days course  Chronic A. Fib -Last dose of Xarelto was last night, Hold 4 factor concentration given the bleeding has been over two weeks and more likely a slow bleeding.  Let Xarelto wash off by half life.  Wheezing -Hx of asthma, but off meds for "years", will start SABA -She has chronic wheezing, according to daughter, the symptoms developed after she was started on ACEI, but symptoms symptoms persisted after even ACEI stopped -Recommend to see pulmonology as outpatient for lung function test  Systolic CHF -BP borderline low, hypovolemia, will hold diuresis and other BP meds for now  DVT prophylaxis: SCD Code Status: Full code Family Communication: Daughter at bedside Disposition Plan: Patient has significant symptomatic anemia and ongoing GI loss, likely will need more than 2 midnight hospital stay for GI work-up and stabilization of hemodynamic  consults called: [facility] by ED physician Admission status: Telemetry admission   Lequita Halt MD [facility] Pager [phone]    02/29/2020, 5:13 PM

## 2020-02-29 NOTE — Telephone Encounter (Addendum)
Patient's daughter called, states she has having some possible blood in stool. Sunday night there was some drops of bright blood, but stools have been very dark for several days. Patient is on Xarelto. Daughter has been having patient hold Xaralto, but no changed in stool. Reports patient is very pale and patient complains of feeling weak.  Patient has HX of stomach ulcer in 2018 and daughter is concerned.   I advised daughter to take patient to ER for evaluation. Daughter agreeable and is going to take her to [facility] ER.   FYI to PCP

## 2020-02-29 NOTE — ED Triage Notes (Signed)
Patient arrived via [facility], patient alert and oriented x 4. EMS stated that they were called due to patient being weak for the last 2 days. Patient was on ABT for UTI starting Saturday morning and wasn't getting well. When EMS attempted to get her up patient passed out being out for about 30-40 second.  EMS CBG-192mg /dl.  EMS gave of NS.

## 2020-02-29 NOTE — ED Provider Notes (Signed)
3:40 PM Care assumed from Dr. Clarene Duke.  At time of transfer care, patient is waiting for results of diagnostic work-up including labs and monitoring.  Clinically there is concern for a syncopal episode in the setting of likely GI bleeding given the dark tarry stools recently.   Hemoglobin returned anemic at 6.9.  When assessed the patient as she will still feeling fatigued and lightheaded.  Blood pressure was around 100 systolic.  Otherwise, patient also has AKI.  I called [facility] whom she is in the past and spoke with Dr. Marca Ancona who will see her during admission.  She requested the patient have INR assessed, Covid test, be started on a Protonix bolus and drip IV, and remain n.p.o.  She anticipates they will do an endoscopy tomorrow.  Patient will be admitted for further management.   Clinical Impression: 1. Syncope, unspecified syncope type   2. Symptomatic anemia   3. Gastrointestinal hemorrhage, unspecified gastrointestinal hemorrhage type   4. AKI (acute kidney injury) (HCC)     Disposition: Admit  This note was prepared with assistance of Dragon voice recognition software. Occasional wrong-word or sound-a-like substitutions may have occurred due to the inherent limitations of voice recognition software.    CRITICAL CARE Performed by: Canary Brim Amarilys Lyles Total critical care time: 30 minutes Critical care time was exclusive of separately billable procedures and treating other patients. Symptomatic anemia from GI bleed with syncope and soft blood pressures requiring admission and blood transfusion. Critical care was necessary to treat or prevent imminent or life-threatening deterioration. Critical care was time spent personally by me on the following activities: development of treatment plan with patient and/or surrogate as well as nursing, discussions with consultants, evaluation of patient's response to treatment, examination of patient, obtaining history from  patient or surrogate, ordering and performing treatments and interventions, ordering and review of laboratory studies, ordering and review of radiographic studies, pulse oximetry and re-evaluation of patient's condition.    Daron Stutz, Canary Brim, MD 03/01/20 [phone]

## 2020-03-01 ENCOUNTER — Other Ambulatory Visit: Payer: Self-pay | Admitting: Physician Assistant

## 2020-03-01 ENCOUNTER — Inpatient Hospital Stay (HOSPITAL_COMMUNITY): Payer: Medicare HMO | Admitting: Certified Registered Nurse Anesthetist

## 2020-03-01 ENCOUNTER — Encounter (HOSPITAL_COMMUNITY)
Admission: EM | Disposition: A | Discharge status: Home Health | Payer: Self-pay | Source: Home / Self Care | Attending: Internal Medicine

## 2020-03-01 ENCOUNTER — Encounter (HOSPITAL_COMMUNITY): Payer: Self-pay | Admitting: Internal Medicine

## 2020-03-01 DIAGNOSIS — K922 Gastrointestinal hemorrhage, unspecified: Secondary | ICD-10-CM

## 2020-03-01 HISTORY — PX: BIOPSY: SHX5522

## 2020-03-01 HISTORY — PX: ESOPHAGOGASTRODUODENOSCOPY: SHX5428

## 2020-03-01 LAB — CBC
HCT: 22.1 % — ABNORMAL LOW (ref 36.0–46.0)
Hemoglobin: 7.3 g/dL — ABNORMAL LOW (ref 12.0–15.0)
MCH: 29.1 pg (ref 26.0–34.0)
MCHC: 33 g/dL (ref 30.0–36.0)
MCV: 88 fL (ref 80.0–100.0)
Platelets: 160 10*3/uL (ref 150–400)
RBC: 2.51 MIL/uL — ABNORMAL LOW (ref 3.87–5.11)
RDW: 18.6 % — ABNORMAL HIGH (ref 11.5–15.5)
WBC: 12.3 10*3/uL — ABNORMAL HIGH (ref 4.0–10.5)
nRBC: 0.6 % — ABNORMAL HIGH (ref 0.0–0.2)

## 2020-03-01 LAB — PREPARE RBC (CROSSMATCH)

## 2020-03-01 LAB — BASIC METABOLIC PANEL
Anion gap: 11 (ref 5–15)
BUN: 76 mg/dL — ABNORMAL HIGH (ref 8–23)
CO2: 19 mmol/L — ABNORMAL LOW (ref 22–32)
Calcium: 8.3 mg/dL — ABNORMAL LOW (ref 8.9–10.3)
Chloride: 112 mmol/L — ABNORMAL HIGH (ref 98–111)
Creatinine, Ser: 1.42 mg/dL — ABNORMAL HIGH (ref 0.44–1.00)
GFR calc Af Amer: 40 mL/min — ABNORMAL LOW (ref >60)
GFR calc non Af Amer: 34 mL/min — ABNORMAL LOW (ref >60)
Glucose, Bld: 110 mg/dL — ABNORMAL HIGH (ref 70–99)
Potassium: 4.3 mmol/L (ref 3.5–5.1)
Sodium: 142 mmol/L (ref 135–145)

## 2020-03-01 SURGERY — EGD (ESOPHAGOGASTRODUODENOSCOPY)
Anesthesia: Monitor Anesthesia Care

## 2020-03-01 MED ORDER — LACTATED RINGERS IV SOLN: INTRAVENOUS | Status: DC | PRN

## 2020-03-01 MED ORDER — SODIUM CHLORIDE 0.9% IV SOLUTION: Freq: Once | INTRAVENOUS | Status: AC

## 2020-03-01 MED ORDER — SODIUM CHLORIDE 0.9 % IV SOLN: INTRAVENOUS | Status: DC

## 2020-03-01 MED ORDER — PANTOPRAZOLE SODIUM 40 MG IV SOLR
40.0000 mg | Freq: Two times a day (BID) | INTRAVENOUS | Status: DC
  Administered 2020-03-01 – 2020-03-02 (×2): 40 mg via INTRAVENOUS
  Filled 2020-03-01 (×3): qty 40

## 2020-03-01 MED ORDER — PROPOFOL 500 MG/50ML IV EMUL
INTRAVENOUS | Status: DC | PRN
  Administered 2020-03-01: 75 ug/kg/min via INTRAVENOUS

## 2020-03-01 MED ORDER — IPRATROPIUM-ALBUTEROL 0.5-2.5 (3) MG/3ML IN SOLN: 3.0000 mL | Freq: Four times a day (QID) | RESPIRATORY_TRACT | Status: DC | PRN

## 2020-03-01 MED ORDER — LACTATED RINGERS IV SOLN: INTRAVENOUS | Status: DC

## 2020-03-01 NOTE — Plan of Care (Signed)
  Problem: Clinical Measurements: Goal: Diagnostic test results will improve Outcome: Completed/Met Goal: Respiratory complications will improve Outcome: Completed/Met   Problem: Nutrition: Goal: Adequate nutrition will be maintained Outcome: Completed/Met   Problem: Coping: Goal: Level of anxiety will decrease Outcome: Completed/Met   Problem: Pain Managment: Goal: General experience of comfort will improve Outcome: Completed/Met   Problem: Education: Goal: Ability to identify signs and symptoms of gastrointestinal bleeding will improve Outcome: Completed/Met   Problem: Fluid Volume: Goal: Will show no signs and symptoms of excessive bleeding Outcome: Completed/Met

## 2020-03-01 NOTE — Anesthesia Postprocedure Evaluation (Signed)
Anesthesia Post Note  Patient: Charlotte [name]  Procedure(s) Performed: ESOPHAGOGASTRODUODENOSCOPY (EGD) (N/A ) BIOPSY     Patient location during evaluation: Endoscopy Anesthesia Type: MAC Level of consciousness: awake and alert Pain management: pain level controlled Vital Signs Assessment: post-procedure vital signs reviewed and stable Respiratory status: spontaneous breathing, nonlabored ventilation, respiratory function stable and patient connected to nasal cannula oxygen Cardiovascular status: stable and blood pressure returned to baseline Postop Assessment: no apparent nausea or vomiting Anesthetic complications: no    Last Vitals:  Vitals:   03/01/20 1255 03/01/20 1305  BP: 125/73 (!) 154/83  Pulse: (!) 113 (!) 108  Resp: 16 16  Temp:    SpO2: 97%     Last Pain:  Vitals:   03/01/20 1305  TempSrc:   PainSc: 0-No pain                 Ercie Eliasen,W. EDMOND

## 2020-03-01 NOTE — Progress Notes (Signed)
New Admission Note:  Arrival Method: Stretcher Mental Orientation: Alert and oriented x4 Telemetry: Box 1 ST Assessment: Completed Skin: Warm and dry. MSAD groin, left breast IV: NSL Pain: Denies Tubes: N/A Safety Measures: Safety Fall Prevention Plan initiated.  Admission: Completed 5 M  Orientation: Patient has been orientated to the room, unit and the staff. Welcome booklet given.  Family:  Orders have been reviewed and implemented. Will continue to monitor the patient. Call light has been placed within reach and bed alarm has been activated.   Guilford Shi BSN, RN  Phone Number: [phone]

## 2020-03-01 NOTE — Op Note (Signed)
[facility] Patient Name: Charlotte [name] Procedure Date : 03/01/2020 MRN: [id number] Attending MD: Kathi Der , MD Date of Birth: 09/20/1936 CSN: [id number] Age: 84 Admit Type: Inpatient Procedure:                Upper GI endoscopy Indications:              Melena Providers:                Kathi Der, MD, Margaree Mackintosh, RN,                            Kandice Robinsons, Technician Referring MD:              Medicines:                Sedation Administered by an Anesthesia Professional Complications:            No immediate complications. Estimated Blood Loss:     Estimated blood loss was minimal. Procedure:                Pre-Anesthesia Assessment:                           - Prior to the procedure, a History and Physical                            was performed, and patient medications and                            allergies were reviewed. The patient's tolerance of                            previous anesthesia was also reviewed. The risks                            and benefits of the procedure and the sedation                            options and risks were discussed with the patient.                            All questions were answered, and informed consent                            was obtained. Prior Anticoagulants: The patient                            last took Xarelto (rivaroxaban) 2 days prior to the                            procedure. ASA Grade Assessment: III - A patient                            with severe systemic disease. After reviewing the  risks and benefits, the patient was deemed in                            satisfactory condition to undergo the procedure.                           After obtaining informed consent, the endoscope was                            passed under direct vision. Throughout the                            procedure, the patient's blood pressure, pulse, and       oxygen saturations were monitored continuously. The                            GIF-H190 ([id number]) Olympus gastroscope was                            introduced through the mouth, and advanced to the                            second part of duodenum. The upper GI endoscopy was                            accomplished without difficulty. The patient                            tolerated the procedure well. Scope In: Scope Out: Findings:      LA Grade A (one or more mucosal breaks less than 5 mm, not extending       between tops of 2 mucosal folds) esophagitis with no bleeding was found       at the gastroesophageal junction.      Many non-bleeding superficial gastric ulcers were found in the gastric       antrum and in the prepyloric region of the stomach. The largest lesion       was 7 mm in largest dimension. Biopsies were taken with a cold forceps       for histology.      The cardia and gastric fundus were normal on retroflexion.      The duodenal bulb, first portion of the duodenum and second portion of       the duodenum were normal. Impression:               - LA Grade A reflux esophagitis with no bleeding.                           - Non-bleeding gastric ulcers. Biopsied.                           - Normal duodenal bulb, first portion of the                            duodenum and second portion of the duodenum. Recommendation:           -  Return patient to hospital ward for ongoing care.                           - Resume previous diet.                           - Continue present medications.                           - Await pathology results. Procedure Code(s):        --- Professional ---                           [phone], Esophagogastroduodenoscopy, flexible,                            transoral; with biopsy, single or multiple Diagnosis Code(s):        --- Professional ---                           K21.00, Gastro-esophageal reflux disease with                             esophagitis, without bleeding                           K25.9, Gastric ulcer, unspecified as acute or                            chronic, without hemorrhage or perforation                           K92.1, Melena (includes Hematochezia) CPT copyright 2019 [employer]. All rights reserved. The codes documented in this report are preliminary and upon coder review may  be revised to meet current compliance requirements. Otis Brace, MD Otis Brace, MD 03/01/2020 12:48:41 PM Number of Addenda: 0

## 2020-03-01 NOTE — Consult Note (Signed)
Referring Provider: Dr. Harrell Gave Tegeler Primary Care Physician:  [name], MD Primary Gastroenterologist:  Dr. Penelope Coop  Reason for Consultation:  Upper GI bleeding  HPI: Charlotte [name] is a 84 y.o. female with history of A. Fib (on Xarelto and aspirin), CAD, CHF (on Lasix), and gastric ulcer (2018) presenting with melena and anemia.  Patient states she has been having melenic stools intermittently for the past several months; however, over the past week, she has been having "gooey" black stools 3-4 times daily.  Then, yesterday, she started feeling lightheaded and short of breath.  She also had a syncopal episode yesterday morning after attempting to stand.    She had one episode of vomiting on Sunday 5/23 but states that contained only food blood or coffee-ground material.  She also noticed a small amount of bright red blood in the toilet yesterday morning and has also seen small amounts of bright red blood intermittently over the past year or so.  She denies any abdominal pain, heartburn, dysphagia, changes in appetite, early satiety, unexplained weight loss.  She denies any NSAID use.  Her father had a history of colon cancer but she denies any additional family history of gastrointestinal malignancies or bleeding disorders.  EGD 05/23/2017 showed one linear gastric ulcer 1 cm long with no stigmata of bleeding was found in the gastric antrum.  Colonoscopy 02/20/2017 showed sigmoid diverticulosis but was otherwise normal.   Past Medical History:  Diagnosis Date  . Aortic insufficiency   . Atrial fibrillation ([place])    a. s/p DCCV 03/2013  . CAD (coronary artery disease)    LHC 06/2010: EF 55%, mild plaque in the LAD 20-30%, otherwise normal coronary arteries  . CATARACTS   . Chronic systolic heart failure ([place])    in setting of AFib;  Echocardiogram 02/2013: Moderate LVH, EF 25-30%, anteroseptal and apical HK, moderate AI, MAC, moderate MR, moderate LAE, mild RAE, PASP 32 => after  DCCV f/u Echo 8/14: Moderate LVH, EF 60-65%, normal wall motion, grade 1 diastolic dysfunction, moderate AI, moderate LAE  . Complication of anesthesia 2009   nausea and vomitting  . GERD (gastroesophageal reflux disease)   . HYPERLIPIDEMIA, MILD   . HYPERTENSION, BENIGN SYSTEMIC   . Irritable bowel syndrome   . LBBB (left bundle branch block)   . MEDIAL EPICONDYLITIS   . NSVT (nonsustained ventricular tachycardia) ([place])    during Dob Echo 2011 => normal cath  . OSTEOARTHRITIS   . OSTEOPENIA   . PONV (postoperative nausea and vomiting)   . Pre-diabetes   . RHINITIS, ALLERGIC   . Stroke ([place])   . SYNCOPE   . VITAMIN D DEFICIENCY     Past Surgical History:  Procedure Laterality Date  . CARDIOVERSION N/A 04/01/2013   Procedure: CARDIOVERSION;  Surgeon: Josue Hector, MD;  Location: [facility] ENDOSCOPY;  Service: Cardiovascular;  Laterality: N/A;  . CARDIOVERSION N/A 07/16/2017   Procedure: CARDIOVERSION;  Surgeon: Pixie Casino, MD;  Location: [facility] ENDOSCOPY;  Service: Cardiovascular;  Laterality: N/A;  . CARDIOVERSION N/A 07/07/2018   Procedure: CARDIOVERSION;  Surgeon: Pixie Casino, MD;  Location: [place];  Service: Cardiovascular;  Laterality: N/A;  . CATARACT EXTRACTION, BILATERAL  2012   Dr. Herbert Deaner  . COLONOSCOPY WITH PROPOFOL N/A 05/23/2017   Procedure: COLONOSCOPY WITH PROPOFOL;  Surgeon: Wonda Horner, MD;  Location: [facility] ENDOSCOPY;  Service: Endoscopy;  Laterality: N/A;  . ESOPHAGOGASTRODUODENOSCOPY (EGD) WITH PROPOFOL N/A 05/23/2017   Procedure: ESOPHAGOGASTRODUODENOSCOPY (EGD) WITH PROPOFOL;  Surgeon:  Wonda Horner, MD;  Location: [facility] ENDOSCOPY;  Service: Endoscopy;  Laterality: N/A;  . IR KYPHO LUMBAR INC FX REDUCE BONE BX UNI/BIL CANNULATION INC/IMAGING  06/06/2017  . IR RADIOLOGIST EVAL & MGMT  07/01/2017  . RADICAL HYSTERECTOMY    . REPLACEMENT TOTAL KNEE     11/03/07 right....... 03/29/08 left    Prior to Admission medications   Medication Sig Start Date End Date  Taking? Authorizing Provider  acetaminophen (TYLENOL) 500 MG tablet Take 1,000 mg by mouth 3 (three) times daily.   Yes [provider]  aspirin EC 81 MG tablet Take 1 tablet (81 mg total) by mouth daily. 09/28/19  Yes Melvenia Beam, MD  atorvastatin (LIPITOR) 40 MG tablet Take 1 tablet (40 mg total) by mouth daily. 09/23/19  Yes Melvenia Beam, MD  carvedilol (COREG) 12.5 MG tablet Take 1 tablet (12.5 mg total) by mouth 2 (two) times daily with a meal. 01/25/20  Yes Crenshaw, Denice Bors, MD  furosemide (LASIX) 20 MG tablet TAKE 1 TABLET TWICE A DAY Patient taking differently: Take 20 mg by mouth 2 (two) times daily.  01/25/20  Yes Lelon Perla, MD  losartan (COZAAR) 25 MG tablet TAKE 1 TABLET BY MOUTH EVERY DAY Patient taking differently: Take 25 mg by mouth daily.  01/27/20  Yes Josue Hector, MD  polyvinyl alcohol-povidone (REFRESH) 1.4-0.6 % ophthalmic solution Place 1-2 drops into both eyes daily as needed (dry eyes).    Yes [provider]  potassium chloride (KLOR-CON) 10 MEQ tablet TAKE 1 TABLET (10 MEQ TOTAL) BY MOUTH 2 (TWO) TIMES DAILY. Patient taking differently: Take 10 mEq by mouth 2 (two) times daily.  01/27/20  Yes Josue Hector, MD  pregabalin (LYRICA) 100 MG capsule TAKE 1 CAPSULE BY MOUTH TWICE A DAY Patient taking differently: Take 100 mg by mouth 2 (two) times daily.  01/25/20  Yes [name], MD  rivaroxaban (XARELTO) 20 MG TABS tablet Take 1 tablet (20 mg total) by mouth daily with supper. 02/26/20  Yes Lelon Perla, MD  sulfamethoxazole-trimethoprim (BACTRIM DS) 800-160 MG tablet Take 1 tablet by mouth 2 (two) times daily. 02/26/20  Yes [name], MD  fluticasone (FLONASE) 50 MCG/ACT nasal spray PLACE 1 SPRAY INTO BOTH NOSTRILS DAILY. Patient not taking: Reported on 02/29/2020 02/05/20   [name], MD  RESTASIS 0.05 % ophthalmic emulsion Place 1 drop into both eyes 2 (two) times daily.  01/13/20   [provider]    Scheduled Meds: . sodium chloride   Intravenous Once  . acetaminophen  1,000 mg Oral TID  . atorvastatin  40 mg Oral Daily  . ipratropium-albuterol  3 mL Nebulization Q6H  . pregabalin  100 mg Oral BID   Continuous Infusions: . sodium chloride    . cefTRIAXone (ROCEPHIN)  IV    . pantoprozole (PROTONIX) infusion 8 mg/hr (03/01/20 0356)   PRN Meds:.acetaminophen **OR** acetaminophen, ipratropium-albuterol, polyvinyl alcohol  Allergies as of 02/29/2020 - Review Complete 02/29/2020  Allergen Reaction Noted  . Metformin and related Other (See Comments) 08/26/2017  . Amlodipine Other (See Comments) 03/30/2014    Family History  Problem Relation Age of Onset  . Arrhythmia Mother        afib  . Alzheimer's disease Mother   . Stroke Mother   . Colon cancer Father   . Heart attack Father 17  . Diabetes Sister   . Arrhythmia Sister        afib  .  Stroke Maternal Grandmother     Social History   Socioeconomic History  . Marital status: Divorced    Spouse name: Not on file  . Number of children: 1  . Years of education: Not on file  . Highest education level: High school graduate  Occupational History  . Occupation: Retired    Fish farm manager: RETIRED  Tobacco Use  . Smoking status: Former Smoker    Quit date: 10/08/1970    Years since quitting: 49.4  . Smokeless tobacco: Never Used  Substance and Sexual Activity  . Alcohol use: No  . Drug use: No  . Sexual activity: Not on file  Other Topics Concern  . Not on file  Social History Narrative   Lives by herself at home   Right handed   Caffeine: 1 cup/day at the most. Recently switched to decaf coffee.   Social Determinants of Health   Financial Resource Strain:   . Difficulty of Paying Living Expenses:   Food Insecurity:   . Worried About Charity fundraiser in the Last Year:   . Arboriculturist in the Last Year:   Transportation Needs:   . Film/video editor (Medical):   Marland Kitchen Lack of  Transportation (Non-Medical):   Physical Activity:   . Days of Exercise per Week:   . Minutes of Exercise per Session:   Stress:   . Feeling of Stress :   Social Connections:   . Frequency of Communication with Friends and Family:   . Frequency of Social Gatherings with Friends and Family:   . Attends Religious Services:   . Active Member of Clubs or Organizations:   . Attends Archivist Meetings:   Marland Kitchen Marital Status:   Intimate Partner Violence:   . Fear of Current or Ex-Partner:   . Emotionally Abused:   Marland Kitchen Physically Abused:   . Sexually Abused:     Review of Systems: Review of Systems  Constitutional: Negative for chills and fever.  HENT: Negative for ear pain and sore throat.   Eyes: Negative for blurred vision and pain.  Respiratory: Positive for shortness of breath. Negative for cough.   Cardiovascular: Negative for chest pain and palpitations.  Gastrointestinal: Positive for diarrhea, melena and vomiting. Negative for abdominal pain, blood in stool, constipation, heartburn and nausea.  Genitourinary: Negative for dysuria and hematuria.  Musculoskeletal: Negative for back pain and joint pain.  Skin: Negative for itching and rash.  Neurological: Positive for dizziness and loss of consciousness.  Endo/Heme/Allergies: Negative for polydipsia. Bruises/bleeds easily.  Psychiatric/Behavioral: Negative for substance abuse. The patient is not nervous/anxious.     Physical Exam: Vital signs: Vitals:   03/01/20 0505 03/01/20 0805  BP: (!) 136/95   Pulse: 99   Resp: 18   Temp: 98.7 F (37.1 C)   SpO2: 100% 97%     Physical Exam  Constitutional: She is oriented to person, place, and time. She appears well-developed and well-nourished. She appears lethargic. No distress.  HENT:  Head: Normocephalic and atraumatic.  Eyes: EOM are normal. No scleral icterus.  Conjunctival pallor  Cardiovascular: Normal rate and normal heart sounds. An irregularly irregular rhythm  present.  Pulmonary/Chest: Effort normal and breath sounds normal. No respiratory distress.  Abdominal: Soft. Bowel sounds are normal. She exhibits no distension and no mass. There is no abdominal tenderness. There is no rebound and no guarding.  Musculoskeletal:        General: Edema present. No deformity.     Comments:  Bilateral lower extremity edema  Neurological: She is oriented to person, place, and time. She appears lethargic.  Skin: Skin is warm and dry.  Psychiatric: She has a normal mood and affect. Her behavior is normal.     GI:  Lab Results: Recent Labs    02/29/20 1518 02/29/20 2207 03/01/20 0608  WBC 16.7*  --  12.3*  HGB 6.9* 7.8* 7.3*  HCT 22.3* 24.5* 22.1*  PLT 195  --  160   BMET Recent Labs    02/29/20 1518 03/01/20 0608  NA 140 142  K 4.7 4.3  CL 112* 112*  CO2 18* 19*  GLUCOSE 135* 110*  BUN 99* 76*  CREATININE 1.84* 1.42*  CALCIUM 8.8* 8.3*   LFT Recent Labs    02/29/20 1518  PROT 5.7*  ALBUMIN 3.0*  AST 14*  ALT 14  ALKPHOS 57  BILITOT 0.7   PT/INR Recent Labs    02/29/20 2207  LABPROT 35.2*  INR 3.7*     Studies/Results: DG Chest 2 View  Result Date: 02/29/2020 CLINICAL DATA:  Shortness of breath, atrial fibrillation, dyspnea on exertion EXAM: CHEST - 2 VIEW COMPARISON:  07/13/2017 FINDINGS: Frontal and lateral views of the chest are obtained. The cardiac silhouette is unremarkable. No airspace disease, effusion, or pneumothorax. No acute bony abnormalities. IMPRESSION: 1. No acute intrathoracic process. Electronically Signed   By: Randa Ngo M.D.   On: 02/29/2020 15:42    Impression: Upper GI bleeding: melena and symptomatic anemia in the setting of Xarelto and aspirin use - history of gastric ulcer per EGD 2018 - Hgb 6.9 on arrival 5/24 with BUN 99/Cr 1.84, consistent with upper GI bleeding - Patient received 1u pRBCs yesterday, and Hgb initially rose to 7.8 then decreased to 7.3 today - BUN 76/Cr 1.42 today - Last  dose Xarelto 5/23  Plan: -EGD today.  I thoroughly described the procedure to include benefits and risks (including but not limited to bleeding, infection, perforation, anesthesia).  Patient gave verbal consent to proceed with EGD today. -Continue to hold Xarelto -Continue Protonix drip -Continue to monitor H&H with transfusion as needed to maintain Hgb >7  [facility] will follow.   LOS: 1 day   Salley Slaughter  PA-C 03/01/2020, 8:30 AM  Contact #  [phone]

## 2020-03-01 NOTE — Anesthesia Procedure Notes (Signed)
Procedure Name: MAC Date/Time: 03/01/2020 12:20 PM Performed by: Harden Mo, CRNA Pre-anesthesia Checklist: Patient identified, Emergency Drugs available, Suction available and Patient being monitored Patient Re-evaluated:Patient Re-evaluated prior to induction Oxygen Delivery Method: Nasal cannula Preoxygenation: Pre-oxygenation with 100% oxygen Induction Type: IV induction Placement Confirmation: positive ETCO2 and breath sounds checked- equal and bilateral Dental Injury: Teeth and Oropharynx as per pre-operative assessment

## 2020-03-01 NOTE — Brief Op Note (Signed)
02/29/2020 - 03/01/2020  3:27 PM  PATIENT:  Charlotte [name]  84 y.o. female  PRE-OPERATIVE DIAGNOSIS:  upper GI bleeding  POST-OPERATIVE DIAGNOSIS:  gastric ulcers, no active bleeding. gastric biopsy for h. pylori   PROCEDURE:  Procedure(s): ESOPHAGOGASTRODUODENOSCOPY (EGD) (N/A) BIOPSY  SURGEON:  Surgeon(s) and Role:    * Brahmbhatt, Parag, MD - Primary  Findings ---------- -EGD showed multiple clean-based, superficial gastric ulcers in the antrum and prepyloric area.  Most likely cause for her upper GI bleed. biopsies taken. -No active bleeding  Recommendations ------------------------ -Advance diet to soft -Change Protonix to IV twice daily. -Recommend pantoprazole 40 mg twice a day for 4 weeks followed by pantoprazole 40 mg once a day. -May consider repeat EGD in 2 to 3 months to document healing of gastric ulcers. -Okay to resume anticoagulation prior to discharge if no further bleeding episodes and hemoglobin stable. -Follow-up with Dr. Evette Cristal in 6 to 8 weeks after discharge. -GI will sign off.  Call us back if needed  Kathi Der MD, FACP 03/01/2020, 3:30 PM  Contact #  [phone]

## 2020-03-01 NOTE — Anesthesia Preprocedure Evaluation (Addendum)
Anesthesia Evaluation  Patient identified by MRN, date of birth, ID band Patient awake    Reviewed: Allergy & Precautions, H&P , NPO status , Patient's Chart, lab work & pertinent test results  History of Anesthesia Complications (+) PONV  Airway Mallampati: II  TM Distance: >3 FB Neck ROM: Full    Dental no notable dental hx. (+) Teeth Intact, Dental Advisory Given   Pulmonary neg pulmonary ROS, former smoker,    Pulmonary exam normal breath sounds clear to auscultation       Cardiovascular hypertension, Pt. on medications and Pt. on home beta blockers + CAD  + dysrhythmias Atrial Fibrillation  Rhythm:Irregular Rate:Tachycardia     Neuro/Psych CVA negative psych ROS   GI/Hepatic Neg liver ROS, GERD  ,  Endo/Other  diabetesMorbid obesity  Renal/GU Renal disease  negative genitourinary   Musculoskeletal  (+) Arthritis ,   Abdominal   Peds  Hematology  (+) Blood dyscrasia, anemia ,   Anesthesia Other Findings   Reproductive/Obstetrics negative OB ROS                            Anesthesia Physical Anesthesia Plan  ASA: III  Anesthesia Plan: MAC   Post-op Pain Management:    Induction: Intravenous  PONV Risk Score and Plan: 3 and Propofol infusion and Treatment may vary due to age or medical condition  Airway Management Planned: Nasal Cannula  Additional Equipment:   Intra-op Plan:   Post-operative Plan:   Informed Consent: I have reviewed the patients History and Physical, chart, labs and discussed the procedure including the risks, benefits and alternatives for the proposed anesthesia with the patient or authorized representative who has indicated his/her understanding and acceptance.     Dental advisory given  Plan Discussed with: CRNA  Anesthesia Plan Comments:         Anesthesia Quick Evaluation

## 2020-03-01 NOTE — Transfer of Care (Signed)
Immediate Anesthesia Transfer of Care Note  Patient: Charlotte [name]  Procedure(s) Performed: ESOPHAGOGASTRODUODENOSCOPY (EGD) (N/A ) BIOPSY  Patient Location: Endoscopy Unit  Anesthesia Type:MAC  Level of Consciousness: awake and drowsy  Airway & Oxygen Therapy: Patient Spontanous Breathing and Patient connected to nasal cannula oxygen  Post-op Assessment: Report given to RN, Post -op Vital signs reviewed and stable and Patient moving all extremities X 4  Post vital signs: Reviewed and stable  Last Vitals:  Vitals Value Taken Time  BP 129/66 03/01/20 1245  Temp    Pulse 108 03/01/20 1245  Resp 15 03/01/20 1246  SpO2 98 % 03/01/20 1245  Vitals shown include unvalidated device data.  Last Pain:  Vitals:   03/01/20 1104  TempSrc: Oral  PainSc: 0-No pain         Complications: No apparent anesthesia complications

## 2020-03-01 NOTE — Progress Notes (Signed)
Progress Note    Charlotte [name]  GBT:[id number] DOB: 19-May-1936  DOA: 02/29/2020 PCP: Agapito Games, MD    Brief Narrative:     Medical records reviewed and are as summarized below:  Charlotte [name] is an 84 y.o. female with medical history significant of recent diagnosis peptic ulcer off PPI, chronic A. fib on Xarelto, CAD, chronic systolic CHF on Lasix, presented with black tarry stool and syncope.  Patient has episode of black tarry stools last month, underwent EGD which showed healing peptic ulcer and patient was prescribed with 1 week of PPI which she completed about 3 weeks ago.  Since about 2 weeks ago she started to pass black tarry stool again.  She started to have feeling of weakness and shortness of breath since yesterday and this morning she passed out while trying to stand up. Hgb was found to be < 7.  Plan for EGD 5/25.    Assessment/Plan:   Active Problems:   Syncope   AKI (acute kidney injury) (HCC)   GI bleeding   Syncope secondary symptomatic anemia -Blood pressure borderline low, hold all BP meds -getting another unit of PRBCs (2 units total now)  Probably upper GI bleed -[facility] informed, patient on PPI drip, EGD as per GI service -plan for EGD today  AKI -continue to monitor -recheck in AM  Klebsiella UTI -She is getting Bactrim since Friday, given her kidney function switched to ceftriaxone for another 3 days to complete a 5 days course  Chronic A. Fib -Last dose of Xarelto 5/23  Wheezing -Hx of asthma, but off meds for "years", will start SABA -She has chronic wheezing, according to daughter, the symptoms developed after she was started on ACEI, but symptoms symptoms persisted after even ACEI stopped -Recommend to see pulmonology as outpatient for lung function test  Systolic CHF -BP borderline low, hypovolemia, will hold diuresis and other BP meds for now  obesity Body mass index is 45.26 kg/m.   Family  Communication/Anticipated D/C date and plan/Code Status   DVT prophylaxis: scd Code Status: Full Code.  Disposition Plan: Status is: Inpatient  Remains inpatient appropriate because:Inpatient level of care appropriate due to severity of illness   Dispo: The patient is from: Home              Anticipated d/c is to: Home              Anticipated d/c date is: 2 days              Patient currently is not medically stable to d/c.    Medical Consultants:    GI    Subjective:   No further bleeding  Objective:    Vitals:   02/29/20 2116 03/01/20 0101 03/01/20 0505 03/01/20 0805  BP: (!) 115/30 (!) 150/84 (!) 136/95   Pulse: 78 99 99   Resp: 16 16 18    Temp: 97.8 F (36.6 C) (!) 97.5 F (36.4 C) 98.7 F (37.1 C)   TempSrc: Oral Oral Oral   SpO2: 91% 99% 100% 97%  Weight: 115.9 kg     Height:        Intake/Output Summary (Last 24 hours) at 03/01/2020 0931 Last data filed at 03/01/2020 0601 Gross per 24 hour  Intake 760.34 ml  Output 600 ml  Net 160.34 ml   Filed Weights   02/29/20 1433 02/29/20 2116  Weight: 131.5 kg 115.9 kg    Exam:  General: Appearance:  Severely obese female in no acute distress     Lungs:     diminished, respirations unlabored  Heart:    irregular  Abd:  Obese, +BS, NT  Neurologic:   Awake, alert, oriented x 3. No apparent focal neurological           defect.     Data Reviewed:   I have personally reviewed following labs and imaging studies:  Labs: Labs show the following:   Basic Metabolic Panel: Recent Labs  Lab 02/29/20 1518 03/01/20 0608  NA 140 142  K 4.7 4.3  CL 112* 112*  CO2 18* 19*  GLUCOSE 135* 110*  BUN 99* 76*  CREATININE 1.84* 1.42*  CALCIUM 8.8* 8.3*   GFR Estimated Creatinine Clearance: 37.5 mL/min (A) (by C-G formula based on SCr of 1.42 mg/dL (H)). Liver Function Tests: Recent Labs  Lab 02/29/20 1518  AST 14*  ALT 14  ALKPHOS 57  BILITOT 0.7  PROT 5.7*  ALBUMIN 3.0*   No results for  input(s): LIPASE, AMYLASE in the last 168 hours. No results for input(s): AMMONIA in the last 168 hours. Coagulation profile Recent Labs  Lab 02/29/20 2207  INR 3.7*    CBC: Recent Labs  Lab 02/29/20 1518 02/29/20 2207 03/01/20 0608  WBC 16.7*  --  12.3*  NEUTROABS 13.1*  --   --   HGB 6.9* 7.8* 7.3*  HCT 22.3* 24.5* 22.1*  MCV 97.4  --  88.0  PLT 195  --  160   Cardiac Enzymes: No results for input(s): CKTOTAL, CKMB, CKMBINDEX, TROPONINI in the last 168 hours. BNP (last 3 results) No results for input(s): PROBNP in the last 8760 hours. CBG: Recent Labs  Lab 02/29/20 2113  GLUCAP 108*   D-Dimer: No results for input(s): DDIMER in the last 72 hours. Hgb A1c: No results for input(s): HGBA1C in the last 72 hours. Lipid Profile: No results for input(s): CHOL, HDL, LDLCALC, TRIG, CHOLHDL, LDLDIRECT in the last 72 hours. Thyroid function studies: No results for input(s): TSH, T4TOTAL, T3FREE, THYROIDAB in the last 72 hours.  Invalid input(s): FREET3 Anemia work up: No results for input(s): VITAMINB12, FOLATE, FERRITIN, TIBC, IRON, RETICCTPCT in the last 72 hours. Sepsis Labs: Recent Labs  Lab 02/29/20 1518 03/01/20 0608  WBC 16.7* 12.3*    Microbiology Recent Results (from the past 240 hour(s))  Urine Culture     Status: Abnormal   Collection Time: 02/22/20  4:59 PM   Specimen: Urine  Result Value Ref Range Status   MICRO NUMBER: [id number]  Final   SPECIMEN QUALITY: Adequate  Final   Sample Source URINE  Final   STATUS: FINAL  Final   ISOLATE 1: Klebsiella pneumoniae (A)  Final    Comment: 10,000-49,000 CFU/mL of Klebsiella pneumoniae      Susceptibility   Klebsiella pneumoniae - URINE CULTURE, REFLEX    AMOX/CLAVULANIC 16 Intermediate     AMPICILLIN >=32 Resistant     AMPICILLIN/SULBACTAM >=32 Resistant     CEFAZOLIN* <=4 Not Reportable      * For infections other than uncomplicated UTIcaused by E. coli, K. pneumoniae or P. mirabilis:Cefazolin is  resistant if MIC > or = 8 mcg/mL.(Distinguishing susceptible versus intermediatefor isolates with MIC < or = 4 mcg/mL requiresadditional testing.)For uncomplicated UTI caused by E. coli,K. pneumoniae or P. mirabilis: Cefazolin issusceptible if MIC <32 mcg/mL and predictssusceptible to the oral agents cefaclor, cefdinir,cefpodoxime, cefprozil, cefuroxime, cephalexinand loracarbef.    CEFEPIME <=1 Sensitive     CEFTRIAXONE <=  1 Sensitive     CIPROFLOXACIN <=0.25 Sensitive     LEVOFLOXACIN 1 Intermediate     ERTAPENEM <=0.5 Sensitive     GENTAMICIN <=1 Sensitive     IMIPENEM <=0.25 Sensitive     NITROFURANTOIN 64 Intermediate     PIP/TAZO 8 Sensitive     TOBRAMYCIN <=1 Sensitive     TRIMETH/SULFA* <=20 Sensitive      * For infections other than uncomplicated UTIcaused by E. coli, K. pneumoniae or P. mirabilis:Cefazolin is resistant if MIC > or = 8 mcg/mL.(Distinguishing susceptible versus intermediatefor isolates with MIC < or = 4 mcg/mL requiresadditional testing.)For uncomplicated UTI caused by E. coli,K. pneumoniae or P. mirabilis: Cefazolin issusceptible if MIC <32 mcg/mL and predictssusceptible to the oral agents cefaclor, cefdinir,cefpodoxime, cefprozil, cefuroxime, cephalexinand loracarbef.Legend:S = Susceptible  I = IntermediateR = Resistant  NS = Not susceptible* = Not tested  NR = Not reported**NN = See antimicrobic comments  SARS Coronavirus 2 by RT PCR (hospital order, performed in [facility] hospital lab) Nasopharyngeal Nasopharyngeal Swab     Status: None   Collection Time: 02/29/20  5:28 PM   Specimen: Nasopharyngeal Swab  Result Value Ref Range Status   SARS Coronavirus 2 NEGATIVE NEGATIVE Final    Comment: (NOTE) SARS-CoV-2 target nucleic acids are NOT DETECTED. The SARS-CoV-2 RNA is generally detectable in upper and lower respiratory specimens during the acute phase of infection. The lowest concentration of SARS-CoV-2 viral copies this assay can detect is 250 copies / mL. A  negative result does not preclude SARS-CoV-2 infection and should not be used as the sole basis for treatment or other patient management decisions.  A negative result may occur with improper specimen collection / handling, submission of specimen other than nasopharyngeal swab, presence of viral mutation(s) within the areas targeted by this assay, and inadequate number of viral copies (<250 copies / mL). A negative result must be combined with clinical observations, patient history, and epidemiological information. Fact Sheet for Patients:   BoilerBrush.com.cy Fact Sheet for Healthcare Providers: https://pope.com/ This test is not yet approved or cleared  by the [place] FDA and has been authorized for detection and/or diagnosis of SARS-CoV-2 by FDA under an Emergency Use Authorization (EUA).  This EUA will remain in effect (meaning this test can be used) for the duration of the COVID-19 declaration under Section 564(b)(1) of the Act, 21 U.S.C. section 360bbb-3(b)(1), unless the authorization is terminated or revoked sooner. Performed at [facility], [address] [place], [place] [address]     Procedures and diagnostic studies:  DG Chest 2 View  Result Date: 02/29/2020 CLINICAL DATA:  Shortness of breath, atrial fibrillation, dyspnea on exertion EXAM: CHEST - 2 VIEW COMPARISON:  07/13/2017 FINDINGS: Frontal and lateral views of the chest are obtained. The cardiac silhouette is unremarkable. No airspace disease, effusion, or pneumothorax. No acute bony abnormalities. IMPRESSION: 1. No acute intrathoracic process. Electronically Signed   By: Sharlet Salina M.D.   On: 02/29/2020 15:42    Medications:   . sodium chloride   Intravenous Once  . acetaminophen  1,000 mg Oral TID  . atorvastatin  40 mg Oral Daily  . ipratropium-albuterol  3 mL Nebulization Q6H  . pregabalin  100 mg Oral BID   Continuous Infusions: . sodium  chloride    . cefTRIAXone (ROCEPHIN)  IV    . pantoprozole (PROTONIX) infusion 8 mg/hr (03/01/20 0356)     LOS: 1 day   Joseph Art  [facility]  How to contact the [facility] Attending or Consulting provider 7A - 7P or covering provider during after hours 7P -7A, for this patient?  1. Check the care team in [facility] and look for a) attending/consulting TR[facility] provider listed and b) the [facility] team listed 2. Log into [email or web address] and use [facility] universal password to access. If you do not have the password, please contact the hospital operator. 3. Locate the [facility] provider you are looking for under [facility] and page to a number that you can be directly reached. 4. If you still have difficulty reaching the provider, please page the [facility] (Director on Call) for the Hospitalists listed on amion for assistance.  03/01/2020, 9:31 AM

## 2020-03-02 DIAGNOSIS — N390 Urinary tract infection, site not specified: Secondary | ICD-10-CM

## 2020-03-02 DIAGNOSIS — K254 Chronic or unspecified gastric ulcer with hemorrhage: Principal | ICD-10-CM

## 2020-03-02 DIAGNOSIS — D62 Acute posthemorrhagic anemia: Secondary | ICD-10-CM

## 2020-03-02 LAB — TYPE AND SCREEN
ABO/RH(D): A POS
Antibody Screen: NEGATIVE
Unit division: 0
Unit division: 0

## 2020-03-02 LAB — CBC
HCT: 25.4 % — ABNORMAL LOW (ref 36.0–46.0)
Hemoglobin: 8 g/dL — ABNORMAL LOW (ref 12.0–15.0)
MCH: 29 pg (ref 26.0–34.0)
MCHC: 31.5 g/dL (ref 30.0–36.0)
MCV: 92 fL (ref 80.0–100.0)
Platelets: 172 10*3/uL (ref 150–400)
RBC: 2.76 MIL/uL — ABNORMAL LOW (ref 3.87–5.11)
RDW: 18.3 % — ABNORMAL HIGH (ref 11.5–15.5)
WBC: 8.1 10*3/uL (ref 4.0–10.5)
nRBC: 0.5 % — ABNORMAL HIGH (ref 0.0–0.2)

## 2020-03-02 LAB — BPAM RBC
Blood Product Expiration Date: 202106142359
Blood Product Expiration Date: 202106142359
ISSUE DATE / TIME: 202105241800
ISSUE DATE / TIME: 202105250946
Unit Type and Rh: 6200
Unit Type and Rh: 6200

## 2020-03-02 LAB — BASIC METABOLIC PANEL
Anion gap: 9 (ref 5–15)
BUN: 37 mg/dL — ABNORMAL HIGH (ref 8–23)
CO2: 24 mmol/L (ref 22–32)
Calcium: 8.5 mg/dL — ABNORMAL LOW (ref 8.9–10.3)
Chloride: 109 mmol/L (ref 98–111)
Creatinine, Ser: 1.08 mg/dL — ABNORMAL HIGH (ref 0.44–1.00)
GFR calc Af Amer: 55 mL/min — ABNORMAL LOW (ref >60)
GFR calc non Af Amer: 48 mL/min — ABNORMAL LOW (ref >60)
Glucose, Bld: 113 mg/dL — ABNORMAL HIGH (ref 70–99)
Potassium: 3.7 mmol/L (ref 3.5–5.1)
Sodium: 142 mmol/L (ref 135–145)

## 2020-03-02 MED ORDER — ASPIRIN EC 81 MG PO TBEC
81.0000 mg | DELAYED_RELEASE_TABLET | Freq: Every day | ORAL | 0 refills | Status: DC
Start: 2020-03-05 — End: 2020-04-12

## 2020-03-02 MED ORDER — OMEPRAZOLE 40 MG PO CPDR: 40.0000 mg | DELAYED_RELEASE_CAPSULE | Freq: Two times a day (BID) | ORAL | 0 refills | Status: DC

## 2020-03-02 MED ORDER — RIVAROXABAN 20 MG PO TABS: 20.0000 mg | ORAL_TABLET | Freq: Every day | ORAL | 0 refills | Status: DC

## 2020-03-02 MED ORDER — CEPHALEXIN 250 MG PO CAPS: 250.0000 mg | ORAL_CAPSULE | Freq: Two times a day (BID) | ORAL | 0 refills | Status: AC

## 2020-03-02 NOTE — Discharge Instructions (Signed)
Gastrointestinal Bleeding Gastrointestinal (GI) bleeding is bleeding somewhere along the digestive tract, between the mouth and the anus. This tract includes the mouth, esophagus, stomach, small intestine, large intestine, and anus. The large intestine is often called the colon. GI bleeding can be caused by various problems. The severity of these problems can range from mild to serious or even life-threatening. If you have GI bleeding, you may find blood in your stools (feces), you may have black stools, or you may vomit blood. If there is a lot of bleeding, you may need to stay in the hospital. What are the causes? This condition may be caused by:  Inflammation, irritation, or swelling of the esophagus (esophagitis). The esophagus is part of the body that moves food from your mouth to your stomach.  Swollen veins in the rectum (hemorrhoids).  Areas of painful tearing in the anus that are often caused by passing hard stool (anal fissures).  Pouches that form on the colon over time, with age, and may bleed a lot (diverticulosis).  Inflammation (diverticulitis) in areas with diverticulosis. This can cause pain, fever, and bloody stools, although bleeding may be mild.  Growths (polyps) or cancer. Colon cancer often starts out as precancerous polyps.  Gastritis and ulcers. With these, bleeding may come from the upper GI tract, near the stomach. What increases the risk? You are more likely to develop this condition if you:  Have an infection in your stomach from a type of bacteria called Helicobacter pylori.  Take certain medicines, such as: ? NSAIDs. ? Aspirin. ? Selective serotonin reuptake inhibitors (SSRIs). ? Steroids. ? Antiplatelet or anticoagulant medicines.  Smoke.  Drink alcohol. What are the signs or symptoms? Common symptoms of this condition include:  Bright red blood in your vomit, or vomit that looks like coffee grounds.  Bloody, black, or tarry stools. ? Bleeding  from the lower GI tract will usually cause red or maroon blood in the stools. ? Bleeding from the upper GI tract may cause black, tarry stools that are often stronger smelling than usual. ? In certain cases, if the bleeding is fast enough, the stools may be red.  Pain or cramping in the abdomen. How is this diagnosed? This condition may be diagnosed based on:  Your medical history and a physical exam.  Various tests, such as: ? Blood tests. ? Stool tests. ? X-rays and other imaging tests. ? Esophagogastroduodenoscopy (EGD). In this test, a flexible, lighted tube is used to look at your esophagus, stomach, and small intestine. ? Colonoscopy. In this test, a flexible, lighted tube is used to look at your colon. How is this treated? Treatment for this condition depends on the cause of the bleeding. For example:  For bleeding from the esophagus, stomach, small intestine, or colon, the health care provider doing your EGD or colonoscopy may be able to stop the bleeding as part of the procedure.  Inflammation or infection of the colon can be treated with medicines.  Certain rectal problems can be treated with creams, suppositories, or warm baths.  Medicines may be given to reduce acid in your stomach.  Surgery is sometimes needed.  Blood transfusions are sometimes needed if a lot of blood has been lost. If bleeding is mild, you may be allowed to go home. If there is a lot of bleeding, you will need to stay in the hospital for observation. Follow these instructions at home:   Take over-the-counter and prescription medicines only as told by your health care provider.    Eat foods that are high in fiber, such as beans, whole grains, and fresh fruits and vegetables. This will help to keep your stools soft. Eating 1-3 prunes each day works well for many people.  Drink enough fluid to keep your urine pale yellow.  Keep all follow-up visits as told by your health care provider. This is  important. Contact a health care provider if:  Your symptoms do not improve. Get help right away if:  Your bleeding does not stop.  You feel light-headed or you faint.  You feel weak.  You have severe cramps in your back or abdomen.  You pass large blood clots in your stool.  Your symptoms are getting worse.  You have chest pain or fast heartbeats. Summary  Gastrointestinal (GI) bleeding is bleeding somewhere along the digestive tract, between the mouth and anus. GI bleeding can be caused by various problems. The severity of these problems can range from mild to serious or even life-threatening.  Treatment for this condition depends on the cause of the bleeding.  Take over-the-counter and prescription medicines only as told by your health care provider.  Keep all follow-up visits as told by your health care provider. This is important.  Get help right away if your bleeding increases, your symptoms are getting worse, or you have new symptoms. This information is not intended to replace advice given to you by your health care provider. Make sure you discuss any questions you have with your health care provider. Document Revised: 05/07/2018 Document Reviewed: 05/07/2018 Elsevier Patient Education  2020 Elsevier Inc.  

## 2020-03-02 NOTE — Progress Notes (Signed)
DISCHARGE NOTE HOME [place] [facility] [place] to be discharged Home per MD order. Discussed prescriptions and follow up appointments with the patient. Prescriptions given to patient; medication list explained in detail. Patient verbalized understanding.  Skin clean, dry and intact without evidence of skin break down, no evidence of skin tears noted. IV catheter discontinued intact. Site without signs and symptoms of complications. Dressing and pressure applied. Pt denies pain at the site currently. No complaints noted.  Patient free of lines, drains, and wounds.   An After Visit Summary (AVS) was printed and given to the patient. Patient escorted via wheelchair, and discharged home via private auto.  Lorine Bears, RN

## 2020-03-02 NOTE — TOC Transition Note (Signed)
Transition of Care [facility]) - CM/SW Discharge Note   Patient Details  Name: Charlotte [name] MRN: [id number] Date of Birth: April 19, 1936  Transition of Care [facility] CM/SW Contact:  Lawerance Sabal, RN Phone Number: 03/02/2020, 10:57 AM   Clinical Narrative:    [name] w patient, daughter and granddaughter at bedside. Discuss support needed after DC at home. All agreeable to [facility] PT. Patient had RW and [employer] PTA, no other DME needs. Discussed HH providers, provided w Medicare ratings. [facility] and [facility]. [facility] accepted. Family and patient updated that they will receive a call in a few days to set up first home visit. No other CM needs identified.     Final next level of care: Home w Home Health Services Barriers to Discharge: No Barriers Identified   Patient Goals and CMS Choice Patient states their goals for this hospitalization and ongoing recovery are:: to go home CMS Medicare.gov Compare Post Acute Care list provided to:: Patient Choice offered to / list presented to : Patient  Discharge Placement                       Discharge Plan and Services                          HH Arranged: PT [facility] Agency: [facility] Date [facility] Agency Contacted: 03/02/20 Time HH Agency Contacted: [phone] Representative spoke with at [facility] Agency: Kandee Keen  Social Determinants of Health (SDOH) Interventions     Readmission Risk Interventions No flowsheet data found.

## 2020-03-02 NOTE — Discharge Summary (Addendum)
Physician Discharge Summary  Charlotte [name]:[id number] DOB: 03-01-1936 DOA: 02/29/2020  PCP: Agapito Games, MD  Admit date: 02/29/2020 Discharge date: 03/02/2020  Admitted From: Home Disposition: Home  Recommendations for Outpatient Follow-up:  1. Follow up with PCP in 1-2 weeks 2. Follow with GI in 6 to 8 weeks 3. Please obtain BMP/CBC in one week 4. Please follow up on the following pending results:  Home Health: None Equipment/Devices: None  Discharge Condition: Stable CODE STATUS: Full code Diet recommendation: Cardiac  Subjective: Patient seen and examined.  Granddaughter at the bedside.  Patient feels much better and has no complaints.  Brief/Interim Summary: Charlotte [name] is an 84 y.o. female with medical history significant ofrecent diagnosis peptic ulceroff PPI, chronic A. fib on Xarelto, CAD, chronic systolic CHF on Lasix, presented with black tarry stool and syncope. Patient had episode of black tarry stools last month, underwent EGD which showed healing peptic ulcer and patient was prescribed with 1 week of PPI which she completed about 3 weeks ago. Sinceabout 2 weeks ago she started to pass black tarry stool again.She started to have feeling of weakness and shortness of breath since 1 to 2 days and on the day of admission, she passed out while trying to stand up.  Upon arrival to ED, Hgb was found to be 6.9 and elevated creatinine with a diagnosis of acute kidney injury as well as UTI.  Patient was admitted under hospitalist service.  Patient Xarelto was stopped and GI was consulted.  Patient was started on PPI drip.  Subsequently she underwent EGDOn 03/01/2020 which showed multiple clean-based, superficial gastric ulcers in the antrum and prepyloric area.  Per GI, this was most likely cause of her GI bleed.  Biopsies were taken and the results are not back yet.  Patient received total of 2 units of PRBC transfusion.  Her Protonix was switched to 40 mg IV twice daily.   GI recommended to discharge today on 03/02/2020 if her hemoglobin remained stable which has remained stable.  It is currently 8.0.  They have recommended discharging on pantoprazole 40 mg twice a day for 4 weeks followed by 40 mg once a day which has been prescribed to her.  They recommended follow-up with GI in 6 to 8 weeks and possible consideration of repeat EGD.  They have also recommended to resume her anticoagulation prior to discharge if no further bleeding.  Patient's hemoglobin is stable which indicates no further bleeding however I am discharging the patient with instruction to resume her Xarelto starting tomorrow and resume her aspirin 3 days later.  She will follow with PCP.  Of note, she came in with AKI with creatinine as high as 1.84 which has improved to 1.08.  She was also treated for UTI for which she is being prescribed cephalexin.  Discharge Diagnoses:  Active Problems:   Syncope   AKI (acute kidney injury) (HCC)   GI bleeding    Discharge Instructions   Allergies as of 03/02/2020      Reactions   Metformin And Related Other (See Comments)   Renal failure   Amlodipine Other (See Comments)   HEADACHE      Medication List    TAKE these medications   acetaminophen 500 MG tablet Commonly known as: TYLENOL Take 1,000 mg by mouth 3 (three) times daily.   aspirin EC 81 MG tablet Take 1 tablet (81 mg total) by mouth daily. Start taking on: Mar 05, 2020 What changed: These instructions start  on Mar 05, 2020. If you are unsure what to do until then, ask your doctor or other care provider.   atorvastatin 40 MG tablet Commonly known as: Lipitor Take 1 tablet (40 mg total) by mouth daily.   carvedilol 12.5 MG tablet Commonly known as: COREG Take 1 tablet (12.5 mg total) by mouth 2 (two) times daily with a meal.   cephALEXin 250 MG capsule Commonly known as: KEFLEX Take 1 capsule (250 mg total) by mouth 2 (two) times daily for 3 days.   fluticasone 50 MCG/ACT nasal  spray Commonly known as: FLONASE PLACE 1 SPRAY INTO BOTH NOSTRILS DAILY.   furosemide 20 MG tablet Commonly known as: LASIX TAKE 1 TABLET TWICE A DAY What changed: when to take this   losartan 25 MG tablet Commonly known as: COZAAR TAKE 1 TABLET BY MOUTH EVERY DAY   omeprazole 40 MG capsule Commonly known as: PRILOSEC Take 1 capsule (40 mg total) by mouth in the morning and at bedtime.   potassium chloride 10 MEQ tablet Commonly known as: KLOR-CON TAKE 1 TABLET (10 MEQ TOTAL) BY MOUTH 2 (TWO) TIMES DAILY. What changed: See the new instructions.   pregabalin 100 MG capsule Commonly known as: LYRICA TAKE 1 CAPSULE BY MOUTH TWICE A DAY   Refresh 1.4-0.6 % ophthalmic solution Generic drug: polyvinyl alcohol-povidone Place 1-2 drops into both eyes daily as needed (dry eyes).   Restasis 0.05 % ophthalmic emulsion Generic drug: cycloSPORINE Place 1 drop into both eyes 2 (two) times daily.   rivaroxaban 20 MG Tabs tablet Commonly known as: Xarelto Take 1 tablet (20 mg total) by mouth daily with supper. Start taking on: Mar 03, 2020   sulfamethoxazole-trimethoprim 800-160 MG tablet Commonly known as: BACTRIM DS Take 1 tablet by mouth 2 (two) times daily.      Follow-up Information    Graylin Shiver, MD. Schedule an appointment as soon as possible for a visit in 6 week(s).   Specialty: Gastroenterology Why: Follow-up on anemia and gastric ulcers Contact information: [address] [place] [address] [phone]        Agapito Games, MD Follow up in 1 week(s).   Specialty: Family Medicine Contact information: [address] [place] [address] [phone]        Wendall Stade, MD .   Specialty: Cardiology Contact information: [phone] [address] [place] [address] [phone]          Allergies  Allergen Reactions  . Metformin And Related Other (See Comments)    Renal failure  .  Amlodipine Other (See Comments)    HEADACHE    Consultations: GI   Procedures/Studies: DG Chest 2 View  Result Date: 02/29/2020 CLINICAL DATA:  Shortness of breath, atrial fibrillation, dyspnea on exertion EXAM: CHEST - 2 VIEW COMPARISON:  07/13/2017 FINDINGS: Frontal and lateral views of the chest are obtained. The cardiac silhouette is unremarkable. No airspace disease, effusion, or pneumothorax. No acute bony abnormalities. IMPRESSION: 1. No acute intrathoracic process. Electronically Signed   By: Sharlet Salina M.D.   On: 02/29/2020 15:42      Discharge Exam: Vitals:   03/01/20 2056 03/02/20 0421  BP: 138/74 (!) 151/88  Pulse: 100 (!) 51  Resp: 18 18  Temp: 99.2 F (37.3 C) 98.4 F (36.9 C)  SpO2: 100% 99%   Vitals:   03/01/20 1337 03/01/20 1824 03/01/20 2056 03/02/20 0421  BP: (!) 156/76 111/65 138/74 (!) 151/88  Pulse: (!) 108 93 100 (!) 51  Resp: 18 18 18 18   Temp: 98.2 F (36.8 C) (!) 97.3 F (36.3 C) 99.2 F (37.3 C) 98.4 F (36.9 C)  TempSrc: Oral Oral Oral Oral  SpO2: 97% 100% 100% 99%  Weight:      Height:        General: Pt is alert, awake, not in acute distress Cardiovascular: RRR, S1/S2 +, no rubs, no gallops Respiratory: CTA bilaterally, no wheezing, no rhonchi Abdominal: Soft, NT, ND, bowel sounds + Extremities: no edema, no cyanosis    The results of significant diagnostics from this hospitalization (including imaging, microbiology, ancillary and laboratory) are listed below for reference.     Microbiology: Recent Results (from the past 240 hour(s))  Urine Culture     Status: Abnormal   Collection Time: 02/22/20  4:59 PM   Specimen: Urine  Result Value Ref Range Status   MICRO NUMBER: [id number]  Final   SPECIMEN QUALITY: Adequate  Final   Sample Source URINE  Final   STATUS: FINAL  Final   ISOLATE 1: Klebsiella pneumoniae (A)  Final    Comment: 10,000-49,000 CFU/mL of Klebsiella pneumoniae      Susceptibility   Klebsiella pneumoniae -  URINE CULTURE, REFLEX    AMOX/CLAVULANIC 16 Intermediate     AMPICILLIN >=32 Resistant     AMPICILLIN/SULBACTAM >=32 Resistant     CEFAZOLIN* <=4 Not Reportable      * For infections other than uncomplicated UTIcaused by E. coli, K. pneumoniae or P. mirabilis:Cefazolin is resistant if MIC > or = 8 mcg/mL.(Distinguishing susceptible versus intermediatefor isolates with MIC < or = 4 mcg/mL requiresadditional testing.)For uncomplicated UTI caused by E. coli,K. pneumoniae or P. mirabilis: Cefazolin issusceptible if MIC <32 mcg/mL and predictssusceptible to the oral agents cefaclor, cefdinir,cefpodoxime, cefprozil, cefuroxime, cephalexinand loracarbef.    CEFEPIME <=1 Sensitive     CEFTRIAXONE <=1 Sensitive     CIPROFLOXACIN <=0.25 Sensitive     LEVOFLOXACIN 1 Intermediate     ERTAPENEM <=0.5 Sensitive     GENTAMICIN <=1 Sensitive     IMIPENEM <=0.25 Sensitive     NITROFURANTOIN 64 Intermediate     PIP/TAZO 8 Sensitive     TOBRAMYCIN <=1 Sensitive     TRIMETH/SULFA* <=20 Sensitive      * For infections other than uncomplicated UTIcaused by E. coli, K. pneumoniae or P. mirabilis:Cefazolin is resistant if MIC > or = 8 mcg/mL.(Distinguishing susceptible versus intermediatefor isolates with MIC < or = 4 mcg/mL requiresadditional testing.)For uncomplicated UTI caused by E. coli,K. pneumoniae or P. mirabilis: Cefazolin issusceptible if MIC <32 mcg/mL and predictssusceptible to the oral agents cefaclor, cefdinir,cefpodoxime, cefprozil, cefuroxime, cephalexinand loracarbef.Legend:S = Susceptible  I = IntermediateR = Resistant  NS = Not susceptible* = Not tested  NR = Not reported**NN = See antimicrobic comments  SARS Coronavirus 2 by RT PCR (hospital order, performed in [place] hospital lab) Nasopharyngeal Nasopharyngeal Swab     Status: None   Collection Time: 02/29/20  5:28 PM   Specimen: Nasopharyngeal Swab  Result Value Ref Range Status   SARS Coronavirus 2 NEGATIVE NEGATIVE Final    Comment:  (NOTE) SARS-CoV-2 target nucleic acids are NOT DETECTED. The SARS-CoV-2 RNA is generally detectable in upper and lower respiratory specimens during the acute phase of infection. The lowest concentration of SARS-CoV-2 viral copies this assay can detect is 250 copies / mL. A negative result does not preclude SARS-CoV-2 infection and should not be used as the sole basis  for treatment or other patient management decisions.  A negative result may occur with improper specimen collection / handling, submission of specimen other than nasopharyngeal swab, presence of viral mutation(s) within the areas targeted by this assay, and inadequate number of viral copies (<250 copies / mL). A negative result must be combined with clinical observations, patient history, and epidemiological information. Fact Sheet for Patients:   BoilerBrush.com.cy Fact Sheet for Healthcare Providers: https://pope.com/ This test is not yet approved or cleared  by the [place] FDA and has been authorized for detection and/or diagnosis of SARS-CoV-2 by FDA under an Emergency Use Authorization (EUA).  This EUA will remain in effect (meaning this test can be used) for the duration of the COVID-19 declaration under Section 564(b)(1) of the Act, 21 U.S.C. section 360bbb-3(b)(1), unless the authorization is terminated or revoked sooner. Performed at [facility], [address] [place], [place] [address]      Labs: BNP (last 3 results) Recent Labs    02/29/20 1518  BNP 72.2   Basic Metabolic Panel: Recent Labs  Lab 02/29/20 1518 03/01/20 0608 03/02/20 0647  NA 140 142 142  K 4.7 4.3 3.7  CL 112* 112* 109  CO2 18* 19* 24  GLUCOSE 135* 110* 113*  BUN 99* 76* 37*  CREATININE 1.84* 1.42* 1.08*  CALCIUM 8.8* 8.3* 8.5*   Liver Function Tests: Recent Labs  Lab 02/29/20 1518  AST 14*  ALT 14  ALKPHOS 57  BILITOT 0.7  PROT 5.7*  ALBUMIN 3.0*   No  results for input(s): LIPASE, AMYLASE in the last 168 hours. No results for input(s): AMMONIA in the last 168 hours. CBC: Recent Labs  Lab 02/29/20 1518 02/29/20 2207 03/01/20 0608 03/02/20 0647  WBC 16.7*  --  12.3* 8.1  NEUTROABS 13.1*  --   --   --   HGB 6.9* 7.8* 7.3* 8.0*  HCT 22.3* 24.5* 22.1* 25.4*  MCV 97.4  --  88.0 92.0  PLT 195  --  160 172   Cardiac Enzymes: No results for input(s): CKTOTAL, CKMB, CKMBINDEX, TROPONINI in the last 168 hours. BNP: Invalid input(s): POCBNP CBG: Recent Labs  Lab 02/29/20 2113  GLUCAP 108*   D-Dimer No results for input(s): DDIMER in the last 72 hours. Hgb A1c No results for input(s): HGBA1C in the last 72 hours. Lipid Profile No results for input(s): CHOL, HDL, LDLCALC, TRIG, CHOLHDL, LDLDIRECT in the last 72 hours. Thyroid function studies No results for input(s): TSH, T4TOTAL, T3FREE, THYROIDAB in the last 72 hours.  Invalid input(s): FREET3 Anemia work up No results for input(s): VITAMINB12, FOLATE, FERRITIN, TIBC, IRON, RETICCTPCT in the last 72 hours. Urinalysis    Component Value Date/Time   COLORURINE YELLOW 01/13/2020 0914   APPEARANCEUR CLOUDY (A) 01/13/2020 0914   LABSPEC 1.020 01/13/2020 0914   PHURINE < OR = 5.0 01/13/2020 0914   GLUCOSEU NEGATIVE 01/13/2020 0914   HGBUR 3+ (A) 01/13/2020 0914   BILIRUBINUR negative 02/02/2020 1618   KETONESUR negative 02/02/2020 1618   KETONESUR NEGATIVE 01/13/2020 0914   PROTEINUR NEGATIVE 01/13/2020 0914   UROBILINOGEN 0.2 02/02/2020 1618   UROBILINOGEN 0.2 03/23/2008 1050   NITRITE Positive (A) 02/02/2020 1618   NITRITE POSITIVE (A) 01/13/2020 0914   LEUKOCYTESUR Small (1+) (A) 02/02/2020 1618   LEUKOCYTESUR 2+ (A) 01/13/2020 0914   Sepsis Labs Invalid input(s): PROCALCITONIN,  WBC,  LACTICIDVEN Microbiology Recent Results (from the past 240 hour(s))  Urine Culture     Status: Abnormal  Collection Time: 02/22/20  4:59 PM   Specimen: Urine  Result Value Ref  Range Status   MICRO NUMBER: [id number]  Final   SPECIMEN QUALITY: Adequate  Final   Sample Source URINE  Final   STATUS: FINAL  Final   ISOLATE 1: Klebsiella pneumoniae (A)  Final    Comment: 10,000-49,000 CFU/mL of Klebsiella pneumoniae      Susceptibility   Klebsiella pneumoniae - URINE CULTURE, REFLEX    AMOX/CLAVULANIC 16 Intermediate     AMPICILLIN >=32 Resistant     AMPICILLIN/SULBACTAM >=32 Resistant     CEFAZOLIN* <=4 Not Reportable      * For infections other than uncomplicated UTIcaused by E. coli, K. pneumoniae or P. mirabilis:Cefazolin is resistant if MIC > or = 8 mcg/mL.(Distinguishing susceptible versus intermediatefor isolates with MIC < or = 4 mcg/mL requiresadditional testing.)For uncomplicated UTI caused by E. coli,K. pneumoniae or P. mirabilis: Cefazolin issusceptible if MIC <32 mcg/mL and predictssusceptible to the oral agents cefaclor, cefdinir,cefpodoxime, cefprozil, cefuroxime, cephalexinand loracarbef.    CEFEPIME <=1 Sensitive     CEFTRIAXONE <=1 Sensitive     CIPROFLOXACIN <=0.25 Sensitive     LEVOFLOXACIN 1 Intermediate     ERTAPENEM <=0.5 Sensitive     GENTAMICIN <=1 Sensitive     IMIPENEM <=0.25 Sensitive     NITROFURANTOIN 64 Intermediate     PIP/TAZO 8 Sensitive     TOBRAMYCIN <=1 Sensitive     TRIMETH/SULFA* <=20 Sensitive      * For infections other than uncomplicated UTIcaused by E. coli, K. pneumoniae or P. mirabilis:Cefazolin is resistant if MIC > or = 8 mcg/mL.(Distinguishing susceptible versus intermediatefor isolates with MIC < or = 4 mcg/mL requiresadditional testing.)For uncomplicated UTI caused by E. coli,K. pneumoniae or P. mirabilis: Cefazolin issusceptible if MIC <32 mcg/mL and predictssusceptible to the oral agents cefaclor, cefdinir,cefpodoxime, cefprozil, cefuroxime, cephalexinand loracarbef.Legend:S = Susceptible  I = IntermediateR = Resistant  NS = Not susceptible* = Not tested  NR = Not reported**NN = See antimicrobic comments  SARS  Coronavirus 2 by RT PCR (hospital order, performed in [facility] hospital lab) Nasopharyngeal Nasopharyngeal Swab     Status: None   Collection Time: 02/29/20  5:28 PM   Specimen: Nasopharyngeal Swab  Result Value Ref Range Status   SARS Coronavirus 2 NEGATIVE NEGATIVE Final    Comment: (NOTE) SARS-CoV-2 target nucleic acids are NOT DETECTED. The SARS-CoV-2 RNA is generally detectable in upper and lower respiratory specimens during the acute phase of infection. The lowest concentration of SARS-CoV-2 viral copies this assay can detect is 250 copies / mL. A negative result does not preclude SARS-CoV-2 infection and should not be used as the sole basis for treatment or other patient management decisions.  A negative result may occur with improper specimen collection / handling, submission of specimen other than nasopharyngeal swab, presence of viral mutation(s) within the areas targeted by this assay, and inadequate number of viral copies (<250 copies / mL). A negative result must be combined with clinical observations, patient history, and epidemiological information. Fact Sheet for Patients:   BoilerBrush.com.cy Fact Sheet for Healthcare Providers: https://pope.com/ This test is not yet approved or cleared  by the [place] FDA and has been authorized for detection and/or diagnosis of SARS-CoV-2 by FDA under an Emergency Use Authorization (EUA).  This EUA will remain in effect (meaning this test can be used) for the duration of the COVID-19 declaration under Section 564(b)(1) of the Act, 21 U.S.C. section 360bbb-3(b)(1), unless the authorization is  terminated or revoked sooner. Performed at [facility], [address], [place], [place] [address]      Time coordinating discharge: Over 30 minutes  SIGNED:   Hughie Closs, MD  [facility] 03/02/2020, 9:28 AM  If 7PM-7AM, please contact  night-coverage [email or web address]

## 2020-03-02 NOTE — Evaluation (Signed)
Physical Therapy Evaluation Patient Details Name: Charlotte [name] MRN: [id number] DOB: June 12, 1936 Today's Date: 03/02/2020   History of Present Illness  Charlotte [name] is an 84 y.o. female with medical history significant of recent diagnosis peptic ulcer off PPI, chronic A. fib on Xarelto, CAD, chronic systolic CHF on Lasix, presented with black tarry stool and syncope; EGD done 5/25  Clinical Impression   Pt admitted with above diagnosis. Comes from home where she lives alone in a multi level home (can stay on the main level, and doesn't need to negotiate stairs) with a ramped entrance; Daughter lives close, and family checks on her daily; Uses a 4 wheeled RW at baseline, takes rest breaks during functional activity as needed; Presents to PT with decr activity tolerance, generalized weakness;  Pt currently with functional limitations due to the deficits listed below (see PT Problem List). Pt will benefit from skilled PT to increase their independence and safety with mobility to allow discharge to the venue listed below.       Follow Up Recommendations Home health PT;Other (comment)(HHOT as well for energy conservation)    Equipment Recommendations  None recommended by PT    Recommendations for Other Services       Precautions / Restrictions Precautions Precautions: Fall Precaution Comments: Fall risk greatly reduced with use of RW      Mobility  Bed Mobility Overal bed mobility: Independent                Transfers Overall transfer level: Needs assistance Equipment used: Rolling walker (2 wheeled) Transfers: Sit to/from Stand Sit to Stand: Supervision         General transfer comment: Discussed safe hand placement  Ambulation/Gait Ambulation/Gait assistance: Min guard;Supervision Gait Distance (Feet): 110 Feet Assistive device: Rolling walker (2 wheeled) Gait Pattern/deviations: Step-through pattern;Decreased step length - right;Decreased step length - left;Decreased  stride length Gait velocity: slowed   General Gait Details: Cues to self-monitor for activity tolerance; Tells me her walking today is slowed compared to her normal  Stairs            Wheelchair Mobility    Modified Rankin (Stroke Patients Only)       Balance Overall balance assessment: Mild deficits observed, not formally tested                                           Pertinent Vitals/Pain Pain Assessment: No/denies pain    Home Living Family/patient expects to be discharged to:: Private residence Living Arrangements: Alone Available Help at Discharge: Family Type of Home: House Home Access: Ramped entrance     Home Layout: Multi-level;Able to live on main level with bedroom/bathroom Home Equipment: Walker - 4 wheels;Shower seat Additional Comments: Daughter lives next door    Prior Function Level of Independence: Independent with assistive device(s)         Comments: Uses Rollator RW; Takes seated rest breaks in needed (for instance, while cooking)     Hand Dominance        Extremity/Trunk Assessment   Upper Extremity Assessment Upper Extremity Assessment: Overall WFL for tasks assessed    Lower Extremity Assessment Lower Extremity Assessment: Generalized weakness       Communication   Communication: No difficulties  Cognition Arousal/Alertness: Awake/alert Behavior During Therapy: WFL for tasks assessed/performed Overall Cognitive Status: Within Functional Limits for tasks assessed  General Comments General comments (skin integrity, edema, etc.): Session conducted on room air; mild DOE with walking hallways; O2 sats ranged 94-100%    Exercises     Assessment/Plan    PT Assessment Patient needs continued PT services  PT Problem List Decreased activity tolerance;Decreased mobility       PT Treatment Interventions DME instruction;Gait training;Stair  training;Functional mobility training;Therapeutic activities;Therapeutic exercise;Balance training;Patient/family education    PT Goals (Current goals can be found in the Care Plan section)  Acute Rehab PT Goals Patient Stated Goal: Hopes to go home soon PT Goal Formulation: With patient Time For Goal Achievement: 03/09/20 Potential to Achieve Goals: Good    Frequency Min 3X/week   Barriers to discharge        Co-evaluation               AM-PAC PT "6 Clicks" Mobility  Outcome Measure Help needed turning from your back to your side while in a flat bed without using bedrails?: None Help needed moving from lying on your back to sitting on the side of a flat bed without using bedrails?: None Help needed moving to and from a bed to a chair (including a wheelchair)?: None Help needed standing up from a chair using your arms (e.g., wheelchair or bedside chair)?: None Help needed to walk in hospital room?: A Little Help needed climbing 3-5 steps with a railing? : A Lot 6 Click Score: 21    End of Session Equipment Utilized During Treatment: Gait belt Activity Tolerance: Patient tolerated treatment well(mild DOE nd fatigue post amb) Patient left: in chair;with call bell/phone within reach;with chair alarm set;with family/visitor present Nurse Communication: Mobility status PT Visit Diagnosis: Other abnormalities of gait and mobility (R26.89);Other (comment)(decr functional capacity)    Time: 2458-0998 PT Time Calculation (min) (ACUTE ONLY): 34 min   Charges:   PT Evaluation $PT Eval Low Complexity: 1 Low PT Treatments $Gait Training: 8-22 mins        Roney Marion, PT  Acute Rehabilitation Services Pager [phone] Office [place] 03/02/2020, 9:25 AM

## 2020-03-03 DIAGNOSIS — N390 Urinary tract infection, site not specified: Secondary | ICD-10-CM | POA: Diagnosis not present

## 2020-03-03 DIAGNOSIS — B961 Klebsiella pneumoniae [K. pneumoniae] as the cause of diseases classified elsewhere: Secondary | ICD-10-CM | POA: Diagnosis not present

## 2020-03-03 DIAGNOSIS — G629 Polyneuropathy, unspecified: Secondary | ICD-10-CM | POA: Diagnosis not present

## 2020-03-03 DIAGNOSIS — I2510 Atherosclerotic heart disease of native coronary artery without angina pectoris: Secondary | ICD-10-CM | POA: Diagnosis not present

## 2020-03-03 DIAGNOSIS — I110 Hypertensive heart disease with heart failure: Secondary | ICD-10-CM | POA: Diagnosis not present

## 2020-03-03 DIAGNOSIS — I5022 Chronic systolic (congestive) heart failure: Secondary | ICD-10-CM | POA: Diagnosis not present

## 2020-03-03 DIAGNOSIS — M545 Low back pain: Secondary | ICD-10-CM | POA: Diagnosis not present

## 2020-03-03 DIAGNOSIS — G8929 Other chronic pain: Secondary | ICD-10-CM | POA: Diagnosis not present

## 2020-03-03 DIAGNOSIS — N179 Acute kidney failure, unspecified: Secondary | ICD-10-CM | POA: Diagnosis not present

## 2020-03-03 DIAGNOSIS — K922 Gastrointestinal hemorrhage, unspecified: Secondary | ICD-10-CM | POA: Diagnosis not present

## 2020-03-03 LAB — SURGICAL PATHOLOGY

## 2020-03-04 LAB — URINE CULTURE

## 2020-03-08 DIAGNOSIS — N390 Urinary tract infection, site not specified: Secondary | ICD-10-CM | POA: Diagnosis not present

## 2020-03-08 DIAGNOSIS — I2510 Atherosclerotic heart disease of native coronary artery without angina pectoris: Secondary | ICD-10-CM | POA: Diagnosis not present

## 2020-03-08 DIAGNOSIS — K922 Gastrointestinal hemorrhage, unspecified: Secondary | ICD-10-CM | POA: Diagnosis not present

## 2020-03-08 DIAGNOSIS — G8929 Other chronic pain: Secondary | ICD-10-CM | POA: Diagnosis not present

## 2020-03-08 DIAGNOSIS — I5022 Chronic systolic (congestive) heart failure: Secondary | ICD-10-CM | POA: Diagnosis not present

## 2020-03-08 DIAGNOSIS — N179 Acute kidney failure, unspecified: Secondary | ICD-10-CM | POA: Diagnosis not present

## 2020-03-08 DIAGNOSIS — B961 Klebsiella pneumoniae [K. pneumoniae] as the cause of diseases classified elsewhere: Secondary | ICD-10-CM | POA: Diagnosis not present

## 2020-03-08 DIAGNOSIS — I110 Hypertensive heart disease with heart failure: Secondary | ICD-10-CM | POA: Diagnosis not present

## 2020-03-08 DIAGNOSIS — M545 Low back pain: Secondary | ICD-10-CM | POA: Diagnosis not present

## 2020-03-08 DIAGNOSIS — G629 Polyneuropathy, unspecified: Secondary | ICD-10-CM | POA: Diagnosis not present

## 2020-03-08 NOTE — Progress Notes (Signed)
HPI: Follow-up atrial fibrillation.  Previously followed by Dr. Johnsie Cancel.  Catheterization September 2011 showed mild plaque in the LAD at 20 to 30%.  Previously had a cardiomyopathy which did improve.  Echocardiogram August 2019 showed ejection fraction 45 to 50%, mild to moderate aortic insufficiency, mild mitral regurgitation, mild left atrial enlargement, mild tricuspid regurgitation.  Nuclear study August 2019 showed ejection fraction 48% with fixed anteroseptal defect felt secondary to left bundle branch block.  No ischemia.  Patient is now treated with amiodarone.  Echocardiogram repeated December 2020 and showed normal LV function, mild left ventricular hypertrophy, grade 1 diastolic dysfunction, mild aortic insufficiency.   Patient admitted May 2021 with GI bleed and syncope.  Initial hemoglobin 6.9.  EGD showed multiple clean-based superficial gastric ulcers.  She was transfused 2 units packed red blood cells and treated with IV Protonix.  Xarelto was held.  Also had transient renal insufficiency with creatinine of 1.84 which improved prior to discharge.  Since last seen  she denies increased dyspnea, chest pain, palpitations or syncope.  Mild increased pedal edema.  Current Outpatient Medications  Medication Sig Dispense Refill  . acetaminophen (TYLENOL) 500 MG tablet Take 1,000 mg by mouth 3 (three) times daily.    Marland Kitchen aspirin EC 81 MG tablet Take 1 tablet (81 mg total) by mouth daily. 30 tablet 0  . atorvastatin (LIPITOR) 40 MG tablet Take 1 tablet (40 mg total) by mouth daily. 90 tablet 6  . carvedilol (COREG) 12.5 MG tablet Take 1 tablet (12.5 mg total) by mouth 2 (two) times daily with a meal. 60 tablet 4  . furosemide (LASIX) 20 MG tablet TAKE 1 TABLET TWICE A DAY (Patient taking differently: Take 20 mg by mouth 2 (two) times daily. ) 60 tablet 4  . losartan (COZAAR) 25 MG tablet TAKE 1 TABLET BY MOUTH EVERY DAY (Patient taking differently: Take 25 mg by mouth daily. ) 90 tablet 1  .  omeprazole (PRILOSEC) 40 MG capsule Take 1 capsule (40 mg total) by mouth in the morning and at bedtime. 60 capsule 0  . polyvinyl alcohol-povidone (REFRESH) 1.4-0.6 % ophthalmic solution Place 1-2 drops into both eyes daily as needed (dry eyes).     . potassium chloride (KLOR-CON) 10 MEQ tablet TAKE 1 TABLET (10 MEQ TOTAL) BY MOUTH 2 (TWO) TIMES DAILY. (Patient taking differently: Take 10 mEq by mouth 2 (two) times daily. ) 180 tablet 1  . pregabalin (LYRICA) 100 MG capsule TAKE 1 CAPSULE BY MOUTH TWICE A DAY (Patient taking differently: Take 100 mg by mouth 2 (two) times daily. ) 180 capsule 0  . rivaroxaban (XARELTO) 20 MG TABS tablet Take 1 tablet (20 mg total) by mouth daily with supper. 90 tablet 0   No current facility-administered medications for this visit.     Past Medical History:  Diagnosis Date  . Aortic insufficiency   . Atrial fibrillation ([place])    a. s/p DCCV 03/2013  . CAD (coronary artery disease)    LHC 06/2010: EF 55%, mild plaque in the LAD 20-30%, otherwise normal coronary arteries  . CATARACTS   . Chronic systolic heart failure ([place])    in setting of AFib;  Echocardiogram 02/2013: Moderate LVH, EF 25-30%, anteroseptal and apical HK, moderate AI, MAC, moderate MR, moderate LAE, mild RAE, PASP 32 => after DCCV f/u Echo 8/14: Moderate LVH, EF 60-65%, normal wall motion, grade 1 diastolic dysfunction, moderate AI, moderate LAE  . Complication of anesthesia 2009   nausea  and vomitting  . GERD (gastroesophageal reflux disease)   . HYPERLIPIDEMIA, MILD   . HYPERTENSION, BENIGN SYSTEMIC   . Irritable bowel syndrome   . LBBB (left bundle branch block)   . MEDIAL EPICONDYLITIS   . NSVT (nonsustained ventricular tachycardia) ([place])    during Dob Echo 2011 => normal cath  . OSTEOARTHRITIS   . OSTEOPENIA   . PONV (postoperative nausea and vomiting)   . Pre-diabetes   . RHINITIS, ALLERGIC   . Stroke ([place])   . SYNCOPE   . VITAMIN D DEFICIENCY     Past Surgical History:    Procedure Laterality Date  . BIOPSY  03/01/2020   Procedure: BIOPSY;  Surgeon: Otis Brace, MD;  Location: [facility];  Service: Gastroenterology;;  . CARDIOVERSION N/A 04/01/2013   Procedure: CARDIOVERSION;  Surgeon: Josue Hector, MD;  Location: [facility] ENDOSCOPY;  Service: Cardiovascular;  Laterality: N/A;  . CARDIOVERSION N/A 07/16/2017   Procedure: CARDIOVERSION;  Surgeon: Pixie Casino, MD;  Location: [facility] ENDOSCOPY;  Service: Cardiovascular;  Laterality: N/A;  . CARDIOVERSION N/A 07/07/2018   Procedure: CARDIOVERSION;  Surgeon: Pixie Casino, MD;  Location: [place];  Service: Cardiovascular;  Laterality: N/A;  . CATARACT EXTRACTION, BILATERAL  2012   Dr. Herbert Deaner  . COLONOSCOPY WITH PROPOFOL N/A 05/23/2017   Procedure: COLONOSCOPY WITH PROPOFOL;  Surgeon: Wonda Horner, MD;  Location: [facility] ENDOSCOPY;  Service: Endoscopy;  Laterality: N/A;  . ESOPHAGOGASTRODUODENOSCOPY N/A 03/01/2020   Procedure: ESOPHAGOGASTRODUODENOSCOPY (EGD);  Surgeon: Otis Brace, MD;  Location: [facility] ENDOSCOPY;  Service: Gastroenterology;  Laterality: N/A;  . ESOPHAGOGASTRODUODENOSCOPY (EGD) WITH PROPOFOL N/A 05/23/2017   Procedure: ESOPHAGOGASTRODUODENOSCOPY (EGD) WITH PROPOFOL;  Surgeon: Wonda Horner, MD;  Location: [facility] ENDOSCOPY;  Service: Endoscopy;  Laterality: N/A;  . IR KYPHO LUMBAR INC FX REDUCE BONE BX UNI/BIL CANNULATION INC/IMAGING  06/06/2017  . IR RADIOLOGIST EVAL & MGMT  07/01/2017  . RADICAL HYSTERECTOMY    . REPLACEMENT TOTAL KNEE     11/03/07 right....... 03/29/08 left    Social History   Socioeconomic History  . Marital status: Divorced    Spouse name: Not on file  . Number of children: 1  . Years of education: Not on file  . Highest education level: High school graduate  Occupational History  . Occupation: Retired    Fish farm manager: RETIRED  Tobacco Use  . Smoking status: Former Smoker    Quit date: 10/08/1970    Years since quitting: 49.4  . Smokeless tobacco: Never Used  Substance  and Sexual Activity  . Alcohol use: No  . Drug use: No  . Sexual activity: Not on file  Other Topics Concern  . Not on file  Social History Narrative   Lives by herself at home   Right handed   Caffeine: 1 cup/day at the most. Recently switched to decaf coffee.   Social Determinants of Health   Financial Resource Strain:   . Difficulty of Paying Living Expenses:   Food Insecurity:   . Worried About Charity fundraiser in the Last Year:   . Arboriculturist in the Last Year:   Transportation Needs:   . Film/video editor (Medical):   Marland Kitchen Lack of Transportation (Non-Medical):   Physical Activity:   . Days of Exercise per Week:   . Minutes of Exercise per Session:   Stress:   . Feeling of Stress :   Social Connections:   . Frequency of Communication with Friends and Family:   . Frequency  of Social Gatherings with Friends and Family:   . Attends Religious Services:   . Active Member of Clubs or Organizations:   . Attends Archivist Meetings:   Marland Kitchen Marital Status:   Intimate Partner Violence:   . Fear of Current or Ex-Partner:   . Emotionally Abused:   Marland Kitchen Physically Abused:   . Sexually Abused:     Family History  Problem Relation Age of Onset  . Arrhythmia Mother        afib  . Alzheimer's disease Mother   . Stroke Mother   . Colon cancer Father   . Heart attack Father 58  . Diabetes Sister   . Arrhythmia Sister        afib  . Stroke Maternal Grandmother     ROS: no fevers or chills, productive cough, hemoptysis, dysphasia, odynophagia, melena, hematochezia, dysuria, hematuria, rash, seizure activity, orthopnea, PND, claudication. Remaining systems are negative.  Physical Exam: Well-developed obese in no acute distress.  Skin is warm and dry.  HEENT is normal.  Neck is supple.  Chest is clear to auscultation with normal expansion.  Cardiovascular exam is irregular Abdominal exam nontender or distended. No masses palpated. Extremities show 1+  edema. neuro grossly intact  ECG-atrial fibrillation at a rate of 91, left bundle branch block.  Personally reviewed  A/P  1 paroxysmal atrial fibrillation-amiodarone discontinued previously due to potential side effects.  She is now in atrial fibrillation.  Not clear to me that her symptoms are different.  Given that she is likely asymptomatic we will plan rate control.  We can reassess in 3 months and if her symptoms worsen we could consider cardioversion at that time.  Continue carvedilol.  Continue Xarelto.  She has had no further GI bleeding.  Note she has had a previous CVA and will need lifelong anticoagulation.  2 hypertension-blood pressure controlled.  Continue present medical regimen.  3 hyperlipidemia-history of mild coronary disease.  Also diabetic.  Continue statin.  4 history of coronary artery disease-continue statin.   5 lower extremity edema-continue diuretic.  6 history of aortic insufficiency-mild on most recent echo.  7 history of CVA-this occurred in December while on Xarelto.  Neurology added aspirin 81 mg daily and we will continue for now.  We discussed the potential increased risk of bleeding with combination Xarelto and aspirin.  Kirk Ruths, MD

## 2020-03-09 ENCOUNTER — Other Ambulatory Visit: Payer: Self-pay

## 2020-03-09 ENCOUNTER — Ambulatory Visit: Payer: Medicare HMO | Admitting: Cardiology

## 2020-03-09 ENCOUNTER — Encounter: Payer: Self-pay | Admitting: Cardiology

## 2020-03-09 VITALS — BP 105/59 | HR 91 | Ht 63.0 in | Wt 290.0 lb

## 2020-03-09 DIAGNOSIS — E785 Hyperlipidemia, unspecified: Secondary | ICD-10-CM | POA: Diagnosis not present

## 2020-03-09 DIAGNOSIS — I480 Paroxysmal atrial fibrillation: Secondary | ICD-10-CM

## 2020-03-09 DIAGNOSIS — I10 Essential (primary) hypertension: Secondary | ICD-10-CM

## 2020-03-09 DIAGNOSIS — I2510 Atherosclerotic heart disease of native coronary artery without angina pectoris: Secondary | ICD-10-CM | POA: Diagnosis not present

## 2020-03-09 NOTE — Patient Instructions (Signed)
Medication Instructions:  NO CHANGE *If you need a refill on your cardiac medications before your next appointment, please call your pharmacy*   Lab Work: Your physician recommends that you return for lab work TOMORROW  If you have labs (blood work) drawn today and your tests are completely normal, you will receive your results only by: Marland Kitchen MyChart Message (if you have MyChart) OR . A paper copy in the mail If you have any lab test that is abnormal or we need to change your treatment, we will call you to review the results.   Follow-Up: At [facility], you and your health needs are our priority.  As part of our continuing mission to provide you with exceptional heart care, we have created designated Provider Care Teams.  These Care Teams include your primary Cardiologist (physician) and Advanced Practice Providers (APPs -  Physician Assistants and Nurse Practitioners) who all work together to provide you with the care you need, when you need it.  We recommend signing up for the patient portal called "MyChart".  Sign up information is provided on this After Visit Summary.  MyChart is used to connect with patients for Virtual Visits (Telemedicine).  Patients are able to view lab/test results, encounter notes, upcoming appointments, etc.  Non-urgent messages can be sent to your provider as well.   To learn more about what you can do with MyChart, go to [email or web address].    Your next appointment:   3 month(s)  The format for your next appointment:   Either In Person or Virtual  Provider:   Olga Millers, MD

## 2020-03-10 ENCOUNTER — Other Ambulatory Visit: Payer: Self-pay

## 2020-03-10 ENCOUNTER — Encounter: Payer: Self-pay | Admitting: Family Medicine

## 2020-03-10 ENCOUNTER — Ambulatory Visit (INDEPENDENT_AMBULATORY_CARE_PROVIDER_SITE_OTHER): Payer: Medicare HMO | Admitting: Family Medicine

## 2020-03-10 VITALS — BP 108/41 | HR 81 | Ht 63.0 in | Wt 300.0 lb

## 2020-03-10 DIAGNOSIS — D649 Anemia, unspecified: Secondary | ICD-10-CM

## 2020-03-10 DIAGNOSIS — K279 Peptic ulcer, site unspecified, unspecified as acute or chronic, without hemorrhage or perforation: Secondary | ICD-10-CM

## 2020-03-10 DIAGNOSIS — I10 Essential (primary) hypertension: Secondary | ICD-10-CM | POA: Diagnosis not present

## 2020-03-10 DIAGNOSIS — K254 Chronic or unspecified gastric ulcer with hemorrhage: Secondary | ICD-10-CM | POA: Diagnosis not present

## 2020-03-10 DIAGNOSIS — I480 Paroxysmal atrial fibrillation: Secondary | ICD-10-CM | POA: Diagnosis not present

## 2020-03-10 DIAGNOSIS — I6381 Other cerebral infarction due to occlusion or stenosis of small artery: Secondary | ICD-10-CM | POA: Diagnosis not present

## 2020-03-10 DIAGNOSIS — G603 Idiopathic progressive neuropathy: Secondary | ICD-10-CM | POA: Diagnosis not present

## 2020-03-10 MED ORDER — OMEPRAZOLE 40 MG PO CPDR: 40.0000 mg | DELAYED_RELEASE_CAPSULE | Freq: Two times a day (BID) | ORAL | 0 refills | Status: DC

## 2020-03-10 MED ORDER — PREGABALIN 100 MG PO CAPS: 100.0000 mg | ORAL_CAPSULE | Freq: Two times a day (BID) | ORAL | 1 refills | Status: DC

## 2020-03-10 NOTE — Assessment & Plan Note (Signed)
Refilled Lyrica today.  Patient preferred a 90-day supply.

## 2020-03-10 NOTE — Patient Outreach (Signed)
[facility]) Care Management  03/10/2020  Charlotte [name] [birth year] [id number]   Red Emmi: Alert reason:  Discharge instructions -no                        Feeling sad, anxious---yes  Placed call to patient and explained reason for call..  Patient reports that she does have her discharge papers and has read them.  Reports she is not feeling depressed.  Patient reports she is doing well since hospital discharge.  Denies any additional concerns today.  PLAN: case closure as no needs identified.  Rowe Pavy, RN, BSN, CEN [facility] [phone]

## 2020-03-10 NOTE — Assessment & Plan Note (Signed)
That she is has still seen some dark looking stools but nothing that looks tarry.  She is going to go for repeat CBC today to make sure that her hemoglobin is stable.  I am also can add iron levels so we can see if we also need to consider putting her on an iron supplement continue omeprazole twice daily for now.  When she sees GI back if she is doing well they might be able to decrease her dose down to once a day.

## 2020-03-10 NOTE — Progress Notes (Signed)
Established Patient Office Visit  Subjective:  Patient ID: Charlotte [name], female    DOB: [birth year]  Age: 84 y.o. MRN: [id number]  CC:  Chief Complaint  Patient presents with  . Hospitalization Follow-up    HPI Charlotte [name] presents for follow-up hospitalization.  Charlotte [name] is an 84 year old female with a history of peptic ulcer disease who had recently come off of her PPI who developed a GI bleed.  She was on Xarelto for chronic A. fib and on aspirin for previous stroke.  She also has a history of chronic systolic heart failure on Lasix.  She presented to the emergency department on May 24 for black tarry stools and syncope.  She was discharged home on May 26, 2 days later.  She was started on IV PPI and discharged home on omeprazole 40 mg twice a day she was given a months worth of the prescription.  Upon admission her hemoglobin was low at 6.9 and she had an elevated creatinine as well as a UTI.  He underwent EGD on 525 which showed multiple clean-based superficial gastric ulcers and this was felt to be the most likely cause of her GI bleed.  She did receive 2 units of packed red blood cells.  She has not followed up with GI yet though they have contacted their office.  GI said that they would contact them to schedule.  She did see her cardiologist yesterday.  He did go ahead and order a BMP and CBC for follow-up.  Past Medical History:  Diagnosis Date  . Aortic insufficiency   . Atrial fibrillation ([place])    a. s/p DCCV 03/2013  . CAD (coronary artery disease)    LHC 06/2010: EF 55%, mild plaque in the LAD 20-30%, otherwise normal coronary arteries  . CATARACTS   . Chronic systolic heart failure ([place])    in setting of AFib;  Echocardiogram 02/2013: Moderate LVH, EF 25-30%, anteroseptal and apical HK, moderate AI, MAC, moderate MR, moderate LAE, mild RAE, PASP 32 => after DCCV f/u Echo 8/14: Moderate LVH, EF 60-65%, normal wall motion, grade 1 diastolic dysfunction, moderate AI, moderate LAE  .  Complication of anesthesia 2009   nausea and vomitting  . GERD (gastroesophageal reflux disease)   . HYPERLIPIDEMIA, MILD   . HYPERTENSION, BENIGN SYSTEMIC   . Irritable bowel syndrome   . LBBB (left bundle branch block)   . MEDIAL EPICONDYLITIS   . NSVT (nonsustained ventricular tachycardia) ([place])    during Dob Echo 2011 => normal cath  . OSTEOARTHRITIS   . OSTEOPENIA   . PONV (postoperative nausea and vomiting)   . Pre-diabetes   . RHINITIS, ALLERGIC   . Stroke ([place])   . SYNCOPE   . VITAMIN D DEFICIENCY     Past Surgical History:  Procedure Laterality Date  . BIOPSY  03/01/2020   Procedure: BIOPSY;  Surgeon: Otis Brace, MD;  Location: [place];  Service: Gastroenterology;;  . CARDIOVERSION N/A 04/01/2013   Procedure: CARDIOVERSION;  Surgeon: Josue Hector, MD;  Location[facility] ENDOSCOPY;  Service: Cardiovascular;  Laterality: N/A;  . CARDIOVERSION N/A 07/16/2017   Procedure: CARDIOVERSION;  Surgeon: Pixie Casino, MD;  Location: [facility] ENDOSCOPY;  Service: Cardiovascular;  Laterality: N/A;  . CARDIOVERSION N/A 07/07/2018   Procedure: CARDIOVERSION;  Surgeon: Pixie Casino, MD;  Location: [place];  Service: Cardiovascular;  Laterality: N/A;  . CATARACT EXTRACTION, BILATERAL  2012   Dr. Herbert Deaner  . COLONOSCOPY WITH PROPOFOL N/A 05/23/2017   Procedure:  COLONOSCOPY WITH PROPOFOL;  Surgeon: Wonda Horner, MD;  Location: [facility] ENDOSCOPY;  Service: Endoscopy;  Laterality: N/A;  . ESOPHAGOGASTRODUODENOSCOPY N/A 03/01/2020   Procedure: ESOPHAGOGASTRODUODENOSCOPY (EGD);  Surgeon: Otis Brace, MD;  Location: [facility] ENDOSCOPY;  Service: Gastroenterology;  Laterality: N/A;  . ESOPHAGOGASTRODUODENOSCOPY (EGD) WITH PROPOFOL N/A 05/23/2017   Procedure: ESOPHAGOGASTRODUODENOSCOPY (EGD) WITH PROPOFOL;  Surgeon: Wonda Horner, MD;  Location: [facility] ENDOSCOPY;  Service: Endoscopy;  Laterality: N/A;  . IR KYPHO LUMBAR INC FX REDUCE BONE BX UNI/BIL CANNULATION INC/IMAGING  06/06/2017  . IR  RADIOLOGIST EVAL & MGMT  07/01/2017  . RADICAL HYSTERECTOMY    . REPLACEMENT TOTAL KNEE     11/03/07 right....... 03/29/08 left    Family History  Problem Relation Age of Onset  . Arrhythmia Mother        afib  . Alzheimer's disease Mother   . Stroke Mother   . Colon cancer Father   . Heart attack Father 38  . Diabetes Sister   . Arrhythmia Sister        afib  . Stroke Maternal Grandmother     Social History   Socioeconomic History  . Marital status: Divorced    Spouse name: Not on file  . Number of children: 1  . Years of education: Not on file  . Highest education level: High school graduate  Occupational History  . Occupation: Retired    Fish farm manager: RETIRED  Tobacco Use  . Smoking status: Former Smoker    Quit date: 10/08/1970    Years since quitting: 49.4  . Smokeless tobacco: Never Used  Substance and Sexual Activity  . Alcohol use: No  . Drug use: No  . Sexual activity: Not on file  Other Topics Concern  . Not on file  Social History Narrative   Lives by herself at home   Right handed   Caffeine: 1 cup/day at the most. Recently switched to decaf coffee.   Social Determinants of Health   Financial Resource Strain:   . Difficulty of Paying Living Expenses:   Food Insecurity:   . Worried About Charity fundraiser in the Last Year:   . Arboriculturist in the Last Year:   Transportation Needs:   . Film/video editor (Medical):   Marland Kitchen Lack of Transportation (Non-Medical):   Physical Activity:   . Days of Exercise per Week:   . Minutes of Exercise per Session:   Stress:   . Feeling of Stress :   Social Connections:   . Frequency of Communication with Friends and Family:   . Frequency of Social Gatherings with Friends and Family:   . Attends Religious Services:   . Active Member of Clubs or Organizations:   . Attends Archivist Meetings:   Marland Kitchen Marital Status:   Intimate Partner Violence:   . Fear of Current or Ex-Partner:   . Emotionally Abused:    Marland Kitchen Physically Abused:   . Sexually Abused:     Outpatient Medications Prior to Visit  Medication Sig Dispense Refill  . acetaminophen (TYLENOL) 500 MG tablet Take 1,000 mg by mouth 3 (three) times daily.    Marland Kitchen aspirin EC 81 MG tablet Take 1 tablet (81 mg total) by mouth daily. 30 tablet 0  . atorvastatin (LIPITOR) 40 MG tablet Take 1 tablet (40 mg total) by mouth daily. 90 tablet 6  . carvedilol (COREG) 12.5 MG tablet Take 1 tablet (12.5 mg total) by mouth 2 (two) times daily with a meal.  60 tablet 4  . furosemide (LASIX) 20 MG tablet TAKE 1 TABLET TWICE A DAY (Patient taking differently: Take 20 mg by mouth 2 (two) times daily. ) 60 tablet 4  . losartan (COZAAR) 25 MG tablet TAKE 1 TABLET BY MOUTH EVERY DAY (Patient taking differently: Take 25 mg by mouth daily. ) 90 tablet 1  . polyvinyl alcohol-povidone (REFRESH) 1.4-0.6 % ophthalmic solution Place 1-2 drops into both eyes daily as needed (dry eyes).     . potassium chloride (KLOR-CON) 10 MEQ tablet TAKE 1 TABLET (10 MEQ TOTAL) BY MOUTH 2 (TWO) TIMES DAILY. (Patient taking differently: Take 10 mEq by mouth 2 (two) times daily. ) 180 tablet 1  . rivaroxaban (XARELTO) 20 MG TABS tablet Take 1 tablet (20 mg total) by mouth daily with supper. 90 tablet 0  . omeprazole (PRILOSEC) 40 MG capsule Take 1 capsule (40 mg total) by mouth in the morning and at bedtime. 60 capsule 0  . pregabalin (LYRICA) 100 MG capsule TAKE 1 CAPSULE BY MOUTH TWICE A DAY (Patient taking differently: Take 100 mg by mouth 2 (two) times daily. ) 180 capsule 0   No facility-administered medications prior to visit.    Allergies  Allergen Reactions  . Metformin And Related Other (See Comments)    Renal failure  . Amlodipine Other (See Comments)    HEADACHE    ROS Review of Systems    Objective:    Physical Exam  Constitutional: She is oriented to person, place, and time. She appears well-developed and well-nourished.  HENT:  Head: Normocephalic and atraumatic.   Cardiovascular: Normal rate, regular rhythm and normal heart sounds.  Pulmonary/Chest: Effort normal and breath sounds normal.  Musculoskeletal:        General: No edema.  Neurological: She is alert and oriented to person, place, and time.  Skin: Skin is warm and dry.  Psychiatric: She has a normal mood and affect. Her behavior is normal.    BP (!) 108/41   Pulse 81   Ht _0  (1.6 m)   Wt 300 lb (136.1 kg)   SpO2 99%   BMI 53.14 kg/m  Wt Readings from Last 3 Encounters:  03/10/20 300 lb (136.1 kg)  03/09/20 290 lb (131.5 kg)  03/01/20 255 lb 8.2 oz (115.9 kg)     There are no preventive care reminders to display for this patient.  There are no preventive care reminders to display for this patient.  Lab Results  Component Value Date   TSH 2.31 09/17/2019   Lab Results  Component Value Date   WBC 8.1 03/02/2020   HGB 8.0 (L) 03/02/2020   HCT 25.4 (L) 03/02/2020   MCV 92.0 03/02/2020   PLT 172 03/02/2020   Lab Results  Component Value Date   NA 142 03/02/2020   K 3.7 03/02/2020   CO2 24 03/02/2020   GLUCOSE 113 (H) 03/02/2020   BUN 37 (H) 03/02/2020   CREATININE 1.08 (H) 03/02/2020   BILITOT 0.7 02/29/2020   ALKPHOS 57 02/29/2020   AST 14 (L) 02/29/2020   ALT 14 02/29/2020   PROT 5.7 (L) 02/29/2020   ALBUMIN 3.0 (L) 02/29/2020   CALCIUM 8.5 (L) 03/02/2020   ANIONGAP 9 03/02/2020   GFR 63.15 05/22/2013   Lab Results  Component Value Date   CHOL 126 01/13/2020   Lab Results  Component Value Date   HDL 41 (L) 01/13/2020   Lab Results  Component Value Date   LDLCALC 70 01/13/2020  Lab Results  Component Value Date   TRIG 69 01/13/2020   Lab Results  Component Value Date   CHOLHDL 3.1 01/13/2020   Lab Results  Component Value Date   HGBA1C 5.7 (A) 01/07/2020      Assessment & Plan:   Problem List Items Addressed This Visit      Cardiovascular and Mediastinum   Essential hypertension - Primary    Blood pressure is a little borderline  low today.  It looks like it was a little low yesterday.  Need to keep an eye on this.  She is currently on carvedilol for rate control she is on a low-dose of the losartan for renal protection and she does take her furosemide daily for heart failure will be difficult to choose which one we would need to decrease or limit 8.        Digestive   Gastrointestinal hemorrhage    That she is has still seen some dark looking stools but nothing that looks tarry.  She is going to go for repeat CBC today to make sure that her hemoglobin is stable.  I am also can add iron levels so we can see if we also need to consider putting her on an iron supplement continue omeprazole twice daily for now.  When she sees GI back if she is doing well they might be able to decrease her dose down to once a day.          Nervous and Auditory   Lacunar stroke ([place])    History of lacunar stroke-she is on Xarelto for A. fib and also aspirin for prevention.  At this point I am very concerned that the benefit of taking the aspirin is outweighed by the potential risks now that she has had 2 GI bleeds in the last year.  Did encourage her to discuss this with neurology.  She has a follow-up in the next couple of weeks.  And see what they would recommend.  They may want to continue it as long as she continues a PPI lifelong.  But again I think it is worth consideration for whether or not this is truly providing significant benefit and outweighs her current risks especially as she continues to age.      Idiopathic progressive polyneuropathy    Refilled Lyrica today.  Patient preferred a 90-day supply.      Relevant Medications   pregabalin (LYRICA) 100 MG capsule    Other Visit Diagnoses    Anemia, unspecified type       Relevant Orders   Fe+TIBC+Fer   Peptic ulcer disease       Relevant Medications   omeprazole (PRILOSEC) 40 MG capsule      Is getting home health services.  Physical therapist is coming out.  I be happy  to sign off on any paperwork needed.  Meds ordered this encounter  Medications  . pregabalin (LYRICA) 100 MG capsule    Sig: Take 1 capsule (100 mg total) by mouth 2 (two) times daily.    Dispense:  180 capsule    Refill:  1    Not to exceed 5 additional fills before 06/27/2020  . omeprazole (PRILOSEC) 40 MG capsule    Sig: Take 1 capsule (40 mg total) by mouth in the morning and at bedtime.    Dispense:  180 capsule    Refill:  0    Follow-up: No follow-ups on file.    Beatrice Lecher, MD

## 2020-03-10 NOTE — Progress Notes (Signed)
She was seen by Dr. Charlyne Quale yesterday. He ordered BMP and CBC and advised that she wait to have this done in case Dr. Linford Arnold wanted to do additional labs.   She reports that she feels "draggy"

## 2020-03-10 NOTE — Assessment & Plan Note (Signed)
History of lacunar stroke-she is on Xarelto for A. fib and also aspirin for prevention.  At this point I am very concerned that the benefit of taking the aspirin is outweighed by the potential risks now that she has had 2 GI bleeds in the last year.  Did encourage her to discuss this with neurology.  She has a follow-up in the next couple of weeks.  And see what they would recommend.  They may want to continue it as long as she continues a PPI lifelong.  But again I think it is worth consideration for whether or not this is truly providing significant benefit and outweighs her current risks especially as she continues to age.

## 2020-03-10 NOTE — Assessment & Plan Note (Signed)
Blood pressure is a little borderline low today.  It looks like it was a little low yesterday.  Need to keep an eye on this.  She is currently on carvedilol for rate control she is on a low-dose of the losartan for renal protection and she does take her furosemide daily for heart failure will be difficult to choose which one we would need to decrease or limit 8.

## 2020-03-11 DIAGNOSIS — I5022 Chronic systolic (congestive) heart failure: Secondary | ICD-10-CM | POA: Diagnosis not present

## 2020-03-11 DIAGNOSIS — G8929 Other chronic pain: Secondary | ICD-10-CM | POA: Diagnosis not present

## 2020-03-11 DIAGNOSIS — I2510 Atherosclerotic heart disease of native coronary artery without angina pectoris: Secondary | ICD-10-CM | POA: Diagnosis not present

## 2020-03-11 DIAGNOSIS — N179 Acute kidney failure, unspecified: Secondary | ICD-10-CM | POA: Diagnosis not present

## 2020-03-11 DIAGNOSIS — B961 Klebsiella pneumoniae [K. pneumoniae] as the cause of diseases classified elsewhere: Secondary | ICD-10-CM | POA: Diagnosis not present

## 2020-03-11 DIAGNOSIS — M545 Low back pain: Secondary | ICD-10-CM | POA: Diagnosis not present

## 2020-03-11 DIAGNOSIS — I110 Hypertensive heart disease with heart failure: Secondary | ICD-10-CM | POA: Diagnosis not present

## 2020-03-11 DIAGNOSIS — K922 Gastrointestinal hemorrhage, unspecified: Secondary | ICD-10-CM | POA: Diagnosis not present

## 2020-03-11 DIAGNOSIS — N390 Urinary tract infection, site not specified: Secondary | ICD-10-CM | POA: Diagnosis not present

## 2020-03-11 DIAGNOSIS — G629 Polyneuropathy, unspecified: Secondary | ICD-10-CM | POA: Diagnosis not present

## 2020-03-11 LAB — BASIC METABOLIC PANEL
BUN/Creatinine Ratio: 21 (calc) (ref 6–22)
BUN: 24 mg/dL (ref 7–25)
CO2: 27 mmol/L (ref 20–32)
Calcium: 8.5 mg/dL — ABNORMAL LOW (ref 8.6–10.4)
Chloride: 103 mmol/L (ref 98–110)
Creat: 1.14 mg/dL — ABNORMAL HIGH (ref 0.60–0.88)
Glucose, Bld: 106 mg/dL (ref 65–139)
Potassium: 4.4 mmol/L (ref 3.5–5.3)
Sodium: 137 mmol/L (ref 135–146)

## 2020-03-11 LAB — CBC
HCT: 26.9 % — ABNORMAL LOW (ref 35.0–45.0)
Hemoglobin: 8.4 g/dL — ABNORMAL LOW (ref 11.7–15.5)
MCH: 27.9 pg (ref 27.0–33.0)
MCHC: 31.2 g/dL — ABNORMAL LOW (ref 32.0–36.0)
MCV: 89.4 fL (ref 80.0–100.0)
MPV: 11.2 fL (ref 7.5–12.5)
Platelets: 380 10*3/uL (ref 140–400)
RBC: 3.01 10*6/uL — ABNORMAL LOW (ref 3.80–5.10)
RDW: 15.9 % — ABNORMAL HIGH (ref 11.0–15.0)
WBC: 7.3 10*3/uL (ref 3.8–10.8)

## 2020-03-11 LAB — IRON,TIBC AND FERRITIN PANEL
%SAT: 5 % (calc) — ABNORMAL LOW (ref 16–45)
Ferritin: 11 ng/mL — ABNORMAL LOW (ref 16–288)
Iron: 16 ug/dL — ABNORMAL LOW (ref 45–160)
TIBC: 321 mcg/dL (calc) (ref 250–450)

## 2020-03-14 ENCOUNTER — Other Ambulatory Visit: Payer: Self-pay | Admitting: Family Medicine

## 2020-03-14 DIAGNOSIS — D649 Anemia, unspecified: Secondary | ICD-10-CM

## 2020-03-15 DIAGNOSIS — G8929 Other chronic pain: Secondary | ICD-10-CM | POA: Diagnosis not present

## 2020-03-15 DIAGNOSIS — N179 Acute kidney failure, unspecified: Secondary | ICD-10-CM | POA: Diagnosis not present

## 2020-03-15 DIAGNOSIS — G629 Polyneuropathy, unspecified: Secondary | ICD-10-CM | POA: Diagnosis not present

## 2020-03-15 DIAGNOSIS — I110 Hypertensive heart disease with heart failure: Secondary | ICD-10-CM | POA: Diagnosis not present

## 2020-03-15 DIAGNOSIS — K922 Gastrointestinal hemorrhage, unspecified: Secondary | ICD-10-CM | POA: Diagnosis not present

## 2020-03-15 DIAGNOSIS — B961 Klebsiella pneumoniae [K. pneumoniae] as the cause of diseases classified elsewhere: Secondary | ICD-10-CM | POA: Diagnosis not present

## 2020-03-15 DIAGNOSIS — I5022 Chronic systolic (congestive) heart failure: Secondary | ICD-10-CM | POA: Diagnosis not present

## 2020-03-15 DIAGNOSIS — N390 Urinary tract infection, site not specified: Secondary | ICD-10-CM | POA: Diagnosis not present

## 2020-03-15 DIAGNOSIS — I2510 Atherosclerotic heart disease of native coronary artery without angina pectoris: Secondary | ICD-10-CM | POA: Diagnosis not present

## 2020-03-15 DIAGNOSIS — M545 Low back pain: Secondary | ICD-10-CM | POA: Diagnosis not present

## 2020-03-17 DIAGNOSIS — G8929 Other chronic pain: Secondary | ICD-10-CM | POA: Diagnosis not present

## 2020-03-17 DIAGNOSIS — G629 Polyneuropathy, unspecified: Secondary | ICD-10-CM | POA: Diagnosis not present

## 2020-03-17 DIAGNOSIS — I2510 Atherosclerotic heart disease of native coronary artery without angina pectoris: Secondary | ICD-10-CM | POA: Diagnosis not present

## 2020-03-17 DIAGNOSIS — K922 Gastrointestinal hemorrhage, unspecified: Secondary | ICD-10-CM | POA: Diagnosis not present

## 2020-03-17 DIAGNOSIS — B961 Klebsiella pneumoniae [K. pneumoniae] as the cause of diseases classified elsewhere: Secondary | ICD-10-CM | POA: Diagnosis not present

## 2020-03-17 DIAGNOSIS — M545 Low back pain: Secondary | ICD-10-CM | POA: Diagnosis not present

## 2020-03-17 DIAGNOSIS — N390 Urinary tract infection, site not specified: Secondary | ICD-10-CM | POA: Diagnosis not present

## 2020-03-17 DIAGNOSIS — I110 Hypertensive heart disease with heart failure: Secondary | ICD-10-CM | POA: Diagnosis not present

## 2020-03-17 DIAGNOSIS — I5022 Chronic systolic (congestive) heart failure: Secondary | ICD-10-CM | POA: Diagnosis not present

## 2020-03-17 DIAGNOSIS — N179 Acute kidney failure, unspecified: Secondary | ICD-10-CM | POA: Diagnosis not present

## 2020-03-18 DIAGNOSIS — N179 Acute kidney failure, unspecified: Secondary | ICD-10-CM | POA: Diagnosis not present

## 2020-03-18 DIAGNOSIS — I110 Hypertensive heart disease with heart failure: Secondary | ICD-10-CM | POA: Diagnosis not present

## 2020-03-18 DIAGNOSIS — I2510 Atherosclerotic heart disease of native coronary artery without angina pectoris: Secondary | ICD-10-CM | POA: Diagnosis not present

## 2020-03-22 DIAGNOSIS — G629 Polyneuropathy, unspecified: Secondary | ICD-10-CM | POA: Diagnosis not present

## 2020-03-22 DIAGNOSIS — K922 Gastrointestinal hemorrhage, unspecified: Secondary | ICD-10-CM | POA: Diagnosis not present

## 2020-03-22 DIAGNOSIS — B961 Klebsiella pneumoniae [K. pneumoniae] as the cause of diseases classified elsewhere: Secondary | ICD-10-CM | POA: Diagnosis not present

## 2020-03-22 DIAGNOSIS — I2510 Atherosclerotic heart disease of native coronary artery without angina pectoris: Secondary | ICD-10-CM | POA: Diagnosis not present

## 2020-03-22 DIAGNOSIS — N390 Urinary tract infection, site not specified: Secondary | ICD-10-CM | POA: Diagnosis not present

## 2020-03-22 DIAGNOSIS — N179 Acute kidney failure, unspecified: Secondary | ICD-10-CM | POA: Diagnosis not present

## 2020-03-22 DIAGNOSIS — M545 Low back pain: Secondary | ICD-10-CM | POA: Diagnosis not present

## 2020-03-22 DIAGNOSIS — I110 Hypertensive heart disease with heart failure: Secondary | ICD-10-CM | POA: Diagnosis not present

## 2020-03-22 DIAGNOSIS — I5022 Chronic systolic (congestive) heart failure: Secondary | ICD-10-CM | POA: Diagnosis not present

## 2020-03-22 DIAGNOSIS — G8929 Other chronic pain: Secondary | ICD-10-CM | POA: Diagnosis not present

## 2020-03-24 ENCOUNTER — Encounter: Payer: Self-pay | Admitting: Neurology

## 2020-03-24 ENCOUNTER — Ambulatory Visit: Payer: Medicare HMO | Admitting: Neurology

## 2020-03-24 VITALS — BP 134/75 | HR 94 | Ht 63.0 in | Wt 305.0 lb

## 2020-03-24 DIAGNOSIS — I6381 Other cerebral infarction due to occlusion or stenosis of small artery: Secondary | ICD-10-CM

## 2020-03-24 DIAGNOSIS — G4719 Other hypersomnia: Secondary | ICD-10-CM

## 2020-03-24 DIAGNOSIS — G603 Idiopathic progressive neuropathy: Secondary | ICD-10-CM | POA: Diagnosis not present

## 2020-03-24 NOTE — Patient Instructions (Addendum)
Stop Aspirin continue Xarelto Try decreasing Lyrica for swelling Continue management, doing great with cholesterol and diabetes Sleep evaluation Dr. Brett Fairy imaging of the the blood vessels of the head and neck - [facility] is [phone]   Sleep Apnea Sleep apnea is a condition in which breathing pauses or becomes shallow during sleep. Episodes of sleep apnea usually last 10 seconds or longer, and they may occur as many as 20 times an hour. Sleep apnea disrupts your sleep and keeps your body from getting the rest that it needs. This condition can increase your risk of certain health problems, including:  Heart attack.  Stroke.  Obesity.  Diabetes.  Heart failure.  Irregular heartbeat. What are the causes? There are three kinds of sleep apnea:  Obstructive sleep apnea. This kind is caused by a blocked or collapsed airway.  Central sleep apnea. This kind happens when the part of the brain that controls breathing does not send the correct signals to the muscles that control breathing.  Mixed sleep apnea. This is a combination of obstructive and central sleep apnea. The most common cause of this condition is a collapsed or blocked airway. An airway can collapse or become blocked if:  Your throat muscles are abnormally relaxed.  Your tongue and tonsils are larger than normal.  You are overweight.  Your airway is smaller than normal. What increases the risk? You are more likely to develop this condition if you:  Are overweight.  Smoke.  Have a smaller than normal airway.  Are elderly.  Are female.  Drink alcohol.  Take sedatives or tranquilizers.  Have a family history of sleep apnea. What are the signs or symptoms? Symptoms of this condition include:  Trouble staying asleep.  Daytime sleepiness and tiredness.  Irritability.  Loud snoring.  Morning headaches.  Trouble concentrating.  Forgetfulness.  Decreased interest in sex.  Unexplained  sleepiness.  Mood swings.  Personality changes.  Feelings of depression.  Waking up often during the night to urinate.  Dry mouth.  Sore throat. How is this diagnosed? This condition may be diagnosed with:  A medical history.  A physical exam.  A series of tests that are done while you are sleeping (sleep study). These tests are usually done in a sleep lab, but they may also be done at home. How is this treated? Treatment for this condition aims to restore normal breathing and to ease symptoms during sleep. It may involve managing health issues that can affect breathing, such as high blood pressure or obesity. Treatment may include:  Sleeping on your side.  Using a decongestant if you have nasal congestion.  Avoiding the use of depressants, including alcohol, sedatives, and narcotics.  Losing weight if you are overweight.  Making changes to your diet.  Quitting smoking.  Using a device to open your airway while you sleep, such as: ? An oral appliance. This is a custom-made mouthpiece that shifts your lower jaw forward. ? A continuous positive airway pressure (CPAP) device. This device blows air through a mask when you breathe out (exhale). ? A nasal expiratory positive airway pressure (EPAP) device. This device has valves that you put into each nostril. ? A bi-level positive airway pressure (BPAP) device. This device blows air through a mask when you breathe in (inhale) and breathe out (exhale).  Having surgery if other treatments do not work. During surgery, excess tissue is removed to create a wider airway. It is important to get treatment for sleep apnea. Without treatment, this  condition can lead to:  High blood pressure.  Coronary artery disease.  In men, an inability to achieve or maintain an erection (impotence).  Reduced thinking abilities. Follow these instructions at home: Lifestyle  Make any lifestyle changes that your health care provider  recommends.  Eat a healthy, well-balanced diet.  Take steps to lose weight if you are overweight.  Avoid using depressants, including alcohol, sedatives, and narcotics.  Do not use any products that contain nicotine or tobacco, such as cigarettes, e-cigarettes, and chewing tobacco. If you need help quitting, ask your health care provider. General instructions  Take over-the-counter and prescription medicines only as told by your health care provider.  If you were given a device to open your airway while you sleep, use it only as told by your health care provider.  If you are having surgery, make sure to tell your health care provider you have sleep apnea. You may need to bring your device with you.  Keep all follow-up visits as told by your health care provider. This is important. Contact a health care provider if:  The device that you received to open your airway during sleep is uncomfortable or does not seem to be working.  Your symptoms do not improve.  Your symptoms get worse. Get help right away if:  You develop: ? Chest pain. ? Shortness of breath. ? Discomfort in your back, arms, or stomach.  You have: ? Trouble speaking. ? Weakness on one side of your body. ? Drooping in your face. These symptoms may represent a serious problem that is an emergency. Do not wait to see if the symptoms will go away. Get medical help right away. Call your local emergency services (911 in the [place] Do not drive yourself to the hospital. Summary  Sleep apnea is a condition in which breathing pauses or becomes shallow during sleep.  The most common cause is a collapsed or blocked airway.  The goal of treatment is to restore normal breathing and to ease symptoms during sleep. This information is not intended to replace advice given to you by your health care provider. Make sure you discuss any questions you have with your health care provider. Document Revised: 03/11/2019 Document  Reviewed: 05/20/2018 Elsevier Patient Education  2020 [employer].

## 2020-03-24 NOTE — Progress Notes (Signed)
[facility]    Provider:  Dr Jaynee Eagles Requesting Provider: [name], * Primary Care Provider:  [name], MD  CC:  Stroke  Interval history: Here for follow up of lacunar stroke.  Patient was admitted just a few months ago for GI bleed, she had come off her PPI, she was on Xarelto for chronic A. fib and on aspirin for prevention of stroke, she presented to the emergency department on May 24 for black tarry stools and syncope, notes reviewed, she was discharged home in May 26 2 days later, she was started on IV PPI and discharged home on omeprazole 40 mg twice a day, hemoglobin was as low as 6.9 and elevated creatinine as well as a UTI, EGD on May 25 showed multiple gastric ulcers and she received blood transfusion, she is going to follow-up with GI.  She has followed up with cardiology since then.  She has had 2 GI bleeds in the last year.  At this time we discussed stopping aspirin, I think her risk of stroke with A. fib is much greater. I don't see results of the CTA of the head and neck, GI couldn't reach her. I will reorder and give them the number. Her cholesterol was good. Daughter provides most information. She is back on Xarelto. She has swelling in her feet. She is on Lyrica which can cause swelling, recommended decreasing lyrica but she gets diarrhea. She is going back to see GI and I recommended discussing with them. Her LDL improved from 162 to 70. hgba1c was 5.7.   HPI:  Charlotte [name] is a 84 y.o. female here as requested by [name], * for stroke. PMHx abnormal glucose, LBBB, HTN, CAD, afib on Xarelto. Here with daughter who provides most information. Patient lives alone. Daughter noticed she was not talking much or elaborating. She would saw I don;t knot then say "oh me" very atypical behavior. She was going to the mailbox in 30 degree weather. Foggy headed.  Patient had no changes neurologically, no weakness, no sensory changes, no  facial, but she was very confused. She had a complete workup and stroke was noticed. Unclear if this was incidental. Daughter says she took her medications. She was taking her Xarelto with food every evening. She stopped because of muscle weakness 10 years ago. Will start Lipitor. No other focal neurologic deficits, associated symptoms, inciting events or modifiable factors.  Reviewed notes, labs and imaging from outside physicians, which showed:  Personally reviewed MRI brain images and agree with the following: IMPRESSION: 1. Acute to subacute lacunar infarct of the left caudate nucleus. No associated hemorrhage or mass effect. 2. No other acute intracranial abnormality, and otherwise minimal to mild for age evidence of chronic small vessel ischemia. 3. A 2.4 cm chronic right middle cranial fossa meningioma is stable since 2018, and most likely clinically silent.  hgba1c 5.7 ldl 162  Review of Systems: Patient complains of symptoms per HPI as well as the following symptoms: stroke. Pertinent negatives and positives per HPI. All others negative.   Social History   Socioeconomic History  . Marital status: Divorced    Spouse name: Not on file  . Number of children: 1  . Years of education: Not on file  . Highest education level: High school graduate  Occupational History  . Occupation: Retired    Fish farm manager: RETIRED  Tobacco Use  . Smoking status: Former Smoker    Quit date: 10/08/1970    Years since  quitting: 49.4  . Smokeless tobacco: Never Used  Vaping Use  . Vaping Use: Never used  Substance and Sexual Activity  . Alcohol use: No  . Drug use: No  . Sexual activity: Not on file  Other Topics Concern  . Not on file  Social History Narrative   Lives by herself at home   Right handed   Caffeine: 1 cup/day at the most. Recently switched to decaf coffee.   Social Determinants of Health   Financial Resource Strain:   . Difficulty of Paying Living Expenses:   Food  Insecurity:   . Worried About Charity fundraiser in the Last Year:   . Arboriculturist in the Last Year:   Transportation Needs:   . Film/video editor (Medical):   Marland Kitchen Lack of Transportation (Non-Medical):   Physical Activity:   . Days of Exercise per Week:   . Minutes of Exercise per Session:   Stress:   . Feeling of Stress :   Social Connections:   . Frequency of Communication with Friends and Family:   . Frequency of Social Gatherings with Friends and Family:   . Attends Religious Services:   . Active Member of Clubs or Organizations:   . Attends Archivist Meetings:   Marland Kitchen Marital Status:   Intimate Partner Violence:   . Fear of Current or Ex-Partner:   . Emotionally Abused:   Marland Kitchen Physically Abused:   . Sexually Abused:     Family History  Problem Relation Age of Onset  . Arrhythmia Mother        afib  . Alzheimer's disease Mother   . Stroke Mother   . Colon cancer Father   . Heart attack Father 76  . Diabetes Sister   . Arrhythmia Sister        afib  . Stroke Maternal Grandmother     Past Medical History:  Diagnosis Date  . Aortic insufficiency   . Atrial fibrillation ([place])    a. s/p DCCV 03/2013  . CAD (coronary artery disease)    LHC 06/2010: EF 55%, mild plaque in the LAD 20-30%, otherwise normal coronary arteries  . CATARACTS   . Chronic systolic heart failure ([place])    in setting of AFib;  Echocardiogram 02/2013: Moderate LVH, EF 25-30%, anteroseptal and apical HK, moderate AI, MAC, moderate MR, moderate LAE, mild RAE, PASP 32 => after DCCV f/u Echo 8/14: Moderate LVH, EF 60-65%, normal wall motion, grade 1 diastolic dysfunction, moderate AI, moderate LAE  . Complication of anesthesia 2009   nausea and vomitting  . GERD (gastroesophageal reflux disease)   . HYPERLIPIDEMIA, MILD   . HYPERTENSION, BENIGN SYSTEMIC   . Irritable bowel syndrome   . LBBB (left bundle branch block)   . MEDIAL EPICONDYLITIS   . NSVT (nonsustained ventricular  tachycardia) ([place])    during Dob Echo 2011 => normal cath  . OSTEOARTHRITIS   . OSTEOPENIA   . PONV (postoperative nausea and vomiting)   . Pre-diabetes   . RHINITIS, ALLERGIC   . Stroke ([place])   . SYNCOPE   . VITAMIN D DEFICIENCY     Patient Active Problem List   Diagnosis Date Noted  . Nocturia 01/07/2020  . Lacunar stroke ([place]) 09/28/2019  . Stasis, venous 04/13/2019  . AKI (acute kidney injury) ([place]) 07/13/2017  . NICM (nonischemic cardiomyopathy) ([place]) 07/12/2017  . Low back pain without sciatica 06/05/2017  . Compression fracture of L3 lumbar vertebra 06/02/2017  .  Low back pain 06/01/2017  . Falls 06/01/2017  . Intractable back pain 06/01/2017  . Arteriosclerosis of abdominal aorta ([place]) 05/30/2017  . Lumbosacral strain 05/29/2017  . Symptomatic anemia 05/21/2017  . Hyponatremia 05/21/2017  . Gastrointestinal hemorrhage   . Chronic constipation 05/10/2015  . Atrial fibrillation ([place]) 11/10/2014  . Severe obesity (BMI >= 40) ([place]) 06/18/2014  . Spinal stenosis of lumbar region without neurogenic claudication 03/30/2014  . Restless legs syndrome (RLS) 03/30/2014  . Idiopathic progressive polyneuropathy 03/30/2014  . Aortic regurgitation 03/03/2014  . Diabetes mellitus type 2 in obese ([place]) 12/09/2013  . Long term current use of anticoagulant therapy 05/14/2013  . VITAMIN D DEFICIENCY 10/31/2010  . VENTRICULAR TACHYCARDIA 05/31/2010  . DYSPNEA 04/24/2010  . CATARACTS 04/20/2010  . Edema 02/28/2010  . ELECTROCARDIOGRAM, ABNORMAL 09/19/2007  . Osteoarthritis of both ankles 01/01/2007  . Hyperlipidemia 08/13/2006  . Essential hypertension 07/16/2006  . RHINITIS, ALLERGIC 07/16/2006  . GASTROESOPHAGEAL REFLUX, NO ESOPHAGITIS 07/16/2006  . IRRITABLE BOWEL SYNDROME 07/16/2006  . OSTEOPENIA 07/16/2006    Past Surgical History:  Procedure Laterality Date  . BIOPSY  03/01/2020   Procedure: BIOPSY;  Surgeon: Otis Brace, MD;  Location: [facility];  Service:  Gastroenterology;;  . CARDIOVERSION N/A 04/01/2013   Procedure: CARDIOVERSION;  Surgeon: Josue Hector, MD;  Location: [facility] ENDOSCOPY;  Service: Cardiovascular;  Laterality: N/A;  . CARDIOVERSION N/A 07/16/2017   Procedure: CARDIOVERSION;  Surgeon: Pixie Casino, MD;  Location: [facility] ENDOSCOPY;  Service: Cardiovascular;  Laterality: N/A;  . CARDIOVERSION N/A 07/07/2018   Procedure: CARDIOVERSION;  Surgeon: Pixie Casino, MD;  Location: [facility];  Service: Cardiovascular;  Laterality: N/A;  . CATARACT EXTRACTION, BILATERAL  2012   Dr. Herbert Deaner  . COLONOSCOPY WITH PROPOFOL N/A 05/23/2017   Procedure: COLONOSCOPY WITH PROPOFOL;  Surgeon: Wonda Horner, MD;  Location: [facility] ENDOSCOPY;  Service: Endoscopy;  Laterality: N/A;  . ESOPHAGOGASTRODUODENOSCOPY N/A 03/01/2020   Procedure: ESOPHAGOGASTRODUODENOSCOPY (EGD);  Surgeon: Otis Brace, MD;  Location[facility] ENDOSCOPY;  Service: Gastroenterology;  Laterality: N/A;  . ESOPHAGOGASTRODUODENOSCOPY (EGD) WITH PROPOFOL N/A 05/23/2017   Procedure: ESOPHAGOGASTRODUODENOSCOPY (EGD) WITH PROPOFOL;  Surgeon: Wonda Horner, MD;  Location: [facility] ENDOSCOPY;  Service: Endoscopy;  Laterality: N/A;  . IR KYPHO LUMBAR INC FX REDUCE BONE BX UNI/BIL CANNULATION INC/IMAGING  06/06/2017  . IR RADIOLOGIST EVAL & MGMT  07/01/2017  . RADICAL HYSTERECTOMY    . REPLACEMENT TOTAL KNEE     11/03/07 right....... 03/29/08 left    Current Outpatient Medications  Medication Sig Dispense Refill  . acetaminophen (TYLENOL) 500 MG tablet Take 1,000 mg by mouth 3 (three) times daily.    Marland Kitchen aspirin EC 81 MG tablet Take 1 tablet (81 mg total) by mouth daily. 30 tablet 0  . atorvastatin (LIPITOR) 40 MG tablet Take 1 tablet (40 mg total) by mouth daily. 90 tablet 6  . carvedilol (COREG) 12.5 MG tablet Take 1 tablet (12.5 mg total) by mouth 2 (two) times daily with a meal. 60 tablet 4  . furosemide (LASIX) 20 MG tablet TAKE 1 TABLET TWICE A DAY (Patient taking differently: Take 20 mg by mouth 2  (two) times daily. ) 60 tablet 4  . losartan (COZAAR) 25 MG tablet TAKE 1 TABLET BY MOUTH EVERY DAY (Patient taking differently: Take 25 mg by mouth daily. ) 90 tablet 1  . omeprazole (PRILOSEC) 40 MG capsule Take 1 capsule (40 mg total) by mouth in the morning and at bedtime. 180 capsule 0  . polyvinyl alcohol-povidone (REFRESH)  1.4-0.6 % ophthalmic solution Place 1-2 drops into both eyes daily as needed (dry eyes).     . potassium chloride (KLOR-CON) 10 MEQ tablet TAKE 1 TABLET (10 MEQ TOTAL) BY MOUTH 2 (TWO) TIMES DAILY. (Patient taking differently: Take 10 mEq by mouth 2 (two) times daily. ) 180 tablet 1  . pregabalin (LYRICA) 100 MG capsule Take 1 capsule (100 mg total) by mouth 2 (two) times daily. 180 capsule 1  . rivaroxaban (XARELTO) 20 MG TABS tablet Take 1 tablet (20 mg total) by mouth daily with supper. 90 tablet 0   No current facility-administered medications for this visit.    Allergies as of 03/24/2020 - Review Complete 03/24/2020  Allergen Reaction Noted  . Metformin and related Other (See Comments) 08/26/2017  . Amlodipine Other (See Comments) 03/30/2014    Vitals: BP 134/75 (BP Location: Right Arm, Patient Position: Sitting, Cuff Size: Large)   Pulse 94   Ht _0  (1.6 m)   Wt (!) 305 lb (138.3 kg)   SpO2 97%   BMI 54.03 kg/m  Last Weight:  Wt Readings from Last 1 Encounters:  03/24/20 (!) 305 lb (138.3 kg)   Last Height:   Ht Readings from Last 1 Encounters:  03/24/20 _1  (1.6 m)     Physical exam: Exam: Gen: NAD, conversant, well nourised, obese, well groomed                     CV: irregular, no MRG. No Carotid Bruits. + pitting peripheral edema, warm, nontender Eyes: Conjunctivae clear without exudates or hemorrhage  Neuro: Detailed Neurologic Exam  Speech:    Speech is normal; fluent and spontaneous with normal comprehension.  Cognition:    The patient is oriented to person, place, and time;     Cranial Nerves:    The pupils are equal,  round, and reactive to light. The fundi are normal and spontaneous venous pulsations are presen. Visual fields are full to finger confrontation. Extraocular movements are intact. Trigeminal sensation is intact and the muscles of mastication are normal. The face is symmetric. The palate elevates in the midline. Hearing intact. Voice is normal. Shoulder shrug is normal. The tongue has normal motion without fasciculations.   Motor Observation:    No asymmetry, no atrophy, and no involuntary movements noted. Tone:    Normal muscle tone.    Posture:    Posture is normal. normal erect    Strength:    Strength is V/V in the upper and lower limbs.      Sensation: intact to LT      Assessment/Plan:  Charlotte [name] is a 84 y.o. female here as requested by [name], * for stroke. PMHx abnormal glucose, LBBB, HTN, CAD, afib on Xarelto. MRi with lacunar stroke. Discussed causes including her uncontrolled cholesterol. Needs thorough stroke evaluation.   Very tired all day, nap a lot, doesn't sleep well, she goes to bed at 10pm, wakes up around 8, never feels refreshed, she wake up with dry mouth, no morning headaches: Sleep eval Dr. Brett Fairy  Stop Aspirin continue Xarelto Try decreasing Lyrica for swelling Continue management, doing great with cholesterol and diabetes imaging of the the blood vessels of the head and neck - [facility] is [phone]  Orders Placed This Encounter  Procedures  . Ambulatory referral to Sleep Studies   No orders of the defined types were placed in this encounter.  I had a long d/w patient about her recent stroke,  risk for recurrent stroke/TIAs, personally independently reviewed imaging studies and stroke evaluation results and answered questions.Continue ASA and Xarelto for secondary stroke prevention and maintain strict control of hypertension with blood pressure goal below 130/90, diabetes with hemoglobin A1c goal below 6.5% and lipids with LDL  cholesterol goal below 70 mg/dL.Patient has h/o statin intolerance will try Lipitr but may discuss the new PCSK9 inhibitor like Praluent/Repatha with cardiology. I also advised the patient to eat a healthy diet with plenty of whole grains, cereals, fruits and vegetables, exercise regularly and maintain ideal body weight .  Cc: [name] *   Sarina Ill, MD  [facility] [address], [place] [address]  Phone [phone] Fax [phone]  I spent 30 minutes of face-to-face and non-face-to-face time with patient on the  1. Lacunar stroke ([place])   2. Idiopathic progressive polyneuropathy   3. Morbid obesity ([place])   4. Excessive daytime sleepiness    diagnosis.  This included previsit chart review, lab review, study review, order entry, electronic health record documentation, patient education on the different diagnostic and therapeutic options, counseling and coordination of care, risks and benefits of management, compliance, or risk factor reduction

## 2020-03-25 DIAGNOSIS — I110 Hypertensive heart disease with heart failure: Secondary | ICD-10-CM | POA: Diagnosis not present

## 2020-03-25 DIAGNOSIS — I5022 Chronic systolic (congestive) heart failure: Secondary | ICD-10-CM | POA: Diagnosis not present

## 2020-03-25 DIAGNOSIS — N390 Urinary tract infection, site not specified: Secondary | ICD-10-CM | POA: Diagnosis not present

## 2020-03-25 DIAGNOSIS — K922 Gastrointestinal hemorrhage, unspecified: Secondary | ICD-10-CM | POA: Diagnosis not present

## 2020-03-25 DIAGNOSIS — G8929 Other chronic pain: Secondary | ICD-10-CM | POA: Diagnosis not present

## 2020-03-25 DIAGNOSIS — N179 Acute kidney failure, unspecified: Secondary | ICD-10-CM | POA: Diagnosis not present

## 2020-03-25 DIAGNOSIS — B961 Klebsiella pneumoniae [K. pneumoniae] as the cause of diseases classified elsewhere: Secondary | ICD-10-CM | POA: Diagnosis not present

## 2020-03-25 DIAGNOSIS — I2510 Atherosclerotic heart disease of native coronary artery without angina pectoris: Secondary | ICD-10-CM | POA: Diagnosis not present

## 2020-03-25 DIAGNOSIS — G629 Polyneuropathy, unspecified: Secondary | ICD-10-CM | POA: Diagnosis not present

## 2020-03-25 DIAGNOSIS — M545 Low back pain: Secondary | ICD-10-CM | POA: Diagnosis not present

## 2020-03-29 ENCOUNTER — Encounter: Payer: Self-pay | Admitting: Family Medicine

## 2020-03-30 ENCOUNTER — Emergency Department (HOSPITAL_BASED_OUTPATIENT_CLINIC_OR_DEPARTMENT_OTHER)
Admission: EM | Admit: 2020-03-30 | Discharge: 2020-03-31 | Disposition: A | Discharge status: Home / Self Care | Payer: Medicare HMO | Attending: Emergency Medicine | Admitting: Emergency Medicine

## 2020-03-30 ENCOUNTER — Other Ambulatory Visit: Payer: Self-pay

## 2020-03-30 ENCOUNTER — Encounter: Payer: Self-pay | Admitting: Family Medicine

## 2020-03-30 ENCOUNTER — Ambulatory Visit: Payer: Medicare HMO | Admitting: Family Medicine

## 2020-03-30 ENCOUNTER — Encounter (HOSPITAL_BASED_OUTPATIENT_CLINIC_OR_DEPARTMENT_OTHER): Payer: Self-pay | Admitting: *Deleted

## 2020-03-30 DIAGNOSIS — Z79899 Other long term (current) drug therapy: Secondary | ICD-10-CM | POA: Diagnosis not present

## 2020-03-30 DIAGNOSIS — Z87891 Personal history of nicotine dependence: Secondary | ICD-10-CM | POA: Diagnosis not present

## 2020-03-30 DIAGNOSIS — Z789 Other specified health status: Secondary | ICD-10-CM | POA: Diagnosis not present

## 2020-03-30 DIAGNOSIS — R1030 Lower abdominal pain, unspecified: Secondary | ICD-10-CM | POA: Diagnosis present

## 2020-03-30 DIAGNOSIS — Z882 Allergy status to sulfonamides status: Secondary | ICD-10-CM | POA: Diagnosis not present

## 2020-03-30 DIAGNOSIS — Z711 Person with feared health complaint in whom no diagnosis is made: Secondary | ICD-10-CM

## 2020-03-30 DIAGNOSIS — I110 Hypertensive heart disease with heart failure: Secondary | ICD-10-CM | POA: Insufficient documentation

## 2020-03-30 DIAGNOSIS — I5022 Chronic systolic (congestive) heart failure: Secondary | ICD-10-CM | POA: Diagnosis not present

## 2020-03-30 DIAGNOSIS — I2510 Atherosclerotic heart disease of native coronary artery without angina pectoris: Secondary | ICD-10-CM | POA: Insufficient documentation

## 2020-03-30 DIAGNOSIS — Z7982 Long term (current) use of aspirin: Secondary | ICD-10-CM | POA: Insufficient documentation

## 2020-03-30 DIAGNOSIS — I509 Heart failure, unspecified: Secondary | ICD-10-CM | POA: Diagnosis not present

## 2020-03-30 DIAGNOSIS — R195 Other fecal abnormalities: Secondary | ICD-10-CM | POA: Diagnosis not present

## 2020-03-30 LAB — CBC WITH DIFFERENTIAL/PLATELET
Abs Immature Granulocytes: 0.02 10*3/uL (ref 0.00–0.07)
Basophils Absolute: 0 10*3/uL (ref 0.0–0.1)
Basophils Relative: 0 %
Eosinophils Absolute: 0.1 10*3/uL (ref 0.0–0.5)
Eosinophils Relative: 1 %
HCT: 29.7 % — ABNORMAL LOW (ref 36.0–46.0)
Hemoglobin: 8.7 g/dL — ABNORMAL LOW (ref 12.0–15.0)
Immature Granulocytes: 0 %
Lymphocytes Relative: 25 %
Lymphs Abs: 1.6 10*3/uL (ref 0.7–4.0)
MCH: 27.4 pg (ref 26.0–34.0)
MCHC: 29.3 g/dL — ABNORMAL LOW (ref 30.0–36.0)
MCV: 93.4 fL (ref 80.0–100.0)
Monocytes Absolute: 0.6 10*3/uL (ref 0.1–1.0)
Monocytes Relative: 10 %
Neutro Abs: 4 10*3/uL (ref 1.7–7.7)
Neutrophils Relative %: 64 %
Platelets: 320 10*3/uL (ref 150–400)
RBC: 3.18 MIL/uL — ABNORMAL LOW (ref 3.87–5.11)
RDW: 19.4 % — ABNORMAL HIGH (ref 11.5–15.5)
WBC: 6.4 10*3/uL (ref 4.0–10.5)
nRBC: 0 % (ref 0.0–0.2)

## 2020-03-30 LAB — COMPREHENSIVE METABOLIC PANEL
ALT: 17 U/L (ref 0–44)
AST: 20 U/L (ref 15–41)
Albumin: 3.4 g/dL — ABNORMAL LOW (ref 3.5–5.0)
Alkaline Phosphatase: 109 U/L (ref 38–126)
Anion gap: 9 (ref 5–15)
BUN: 19 mg/dL (ref 8–23)
CO2: 23 mmol/L (ref 22–32)
Calcium: 8.2 mg/dL — ABNORMAL LOW (ref 8.9–10.3)
Chloride: 105 mmol/L (ref 98–111)
Creatinine, Ser: 1.22 mg/dL — ABNORMAL HIGH (ref 0.44–1.00)
GFR calc Af Amer: 48 mL/min — ABNORMAL LOW (ref >60)
GFR calc non Af Amer: 41 mL/min — ABNORMAL LOW (ref >60)
Glucose, Bld: 125 mg/dL — ABNORMAL HIGH (ref 70–99)
Potassium: 3.6 mmol/L (ref 3.5–5.1)
Sodium: 137 mmol/L (ref 135–145)
Total Bilirubin: 0.2 mg/dL — ABNORMAL LOW (ref 0.3–1.2)
Total Protein: 6.8 g/dL (ref 6.5–8.1)

## 2020-03-30 NOTE — ED Triage Notes (Addendum)
Pt states black stool x 1 week, HX " bleeding  Ulcer" , pt also states she started Iron tablets x 10 days ago

## 2020-03-30 NOTE — Telephone Encounter (Signed)
I called patient's daughter [name] and discussed situation. She confirmed with me that when she called about this issue previously, it was in face a GI bleed. She states she did not take her mother to ER or Urgent when advised last time because patient did not want to go, and she finally called EMS for patient once she passed out.   Daughter states she is a Engineer, civil (consulting) and knows that iron can turn stool dark, but she hasn't seen the stool for this episode and just wants to know if she can obtain hemoccult test OTC. I advised daughter there was no hemoccult test OTC and that with recent HX of GI bleed (02/29/20) this needs to be addressed urgently. She states that she doesn't think it is urgent and wants to have her mother seen in PCP office tomorrow.   Has a referral to GI and called them, but has not established and they stated they could not get patient in until July. Offered patient's daughter an appointment today with Dr Ashley Royalty at 4:20, daughter declined states she has an appointment at 5:00. Advised daughter to take patient to emergency room for evaluation, she declined stating "anywhere but a [facility]. I work for [employer] and I refuse to take her there to wait for hours just for a hemoccult test". I advised daughter Urgent Care as an alternative, she declined this also stating that she did not want to take patient somewhere she had to wait.   She insisted that patent be seen tomorrow instead of today. I have scheduled patient for 8:10 tomorrow with [place] but made sure she understood this was NOT what we recommend and that she should exhaust all efforts to have patient seen today. Daughter states she understand this is what we recommend but she does not feel this is an urgent matter.   FYI to PCP

## 2020-03-30 NOTE — Telephone Encounter (Signed)
Agree with documentation as above.   Eboney [place], MD  

## 2020-03-30 NOTE — ED Notes (Signed)
Previous GI bleed, was hospitalized until hemoglobin was ~8.0, was ~8.5 @ last check up per daughter. States some weakness.

## 2020-03-31 ENCOUNTER — Ambulatory Visit: Payer: Medicare HMO | Admitting: Nurse Practitioner

## 2020-03-31 DIAGNOSIS — I5022 Chronic systolic (congestive) heart failure: Secondary | ICD-10-CM | POA: Diagnosis not present

## 2020-03-31 DIAGNOSIS — G8929 Other chronic pain: Secondary | ICD-10-CM | POA: Diagnosis not present

## 2020-03-31 DIAGNOSIS — M545 Low back pain: Secondary | ICD-10-CM | POA: Diagnosis not present

## 2020-03-31 DIAGNOSIS — I110 Hypertensive heart disease with heart failure: Secondary | ICD-10-CM | POA: Diagnosis not present

## 2020-03-31 DIAGNOSIS — B961 Klebsiella pneumoniae [K. pneumoniae] as the cause of diseases classified elsewhere: Secondary | ICD-10-CM | POA: Diagnosis not present

## 2020-03-31 DIAGNOSIS — I2510 Atherosclerotic heart disease of native coronary artery without angina pectoris: Secondary | ICD-10-CM | POA: Diagnosis not present

## 2020-03-31 DIAGNOSIS — G629 Polyneuropathy, unspecified: Secondary | ICD-10-CM | POA: Diagnosis not present

## 2020-03-31 DIAGNOSIS — N179 Acute kidney failure, unspecified: Secondary | ICD-10-CM | POA: Diagnosis not present

## 2020-03-31 DIAGNOSIS — K922 Gastrointestinal hemorrhage, unspecified: Secondary | ICD-10-CM | POA: Diagnosis not present

## 2020-03-31 DIAGNOSIS — N390 Urinary tract infection, site not specified: Secondary | ICD-10-CM | POA: Diagnosis not present

## 2020-03-31 LAB — OCCULT BLOOD X 1 CARD TO LAB, STOOL: Fecal Occult Bld: NEGATIVE

## 2020-03-31 NOTE — ED Provider Notes (Signed)
[facility] Provider Note: Charlotte Spurling, MD, FACEP  CSN: [id number] MRN: [id number] ARRIVAL: 03/30/20 at 2047 ROOM: [place]  Rectal Bleeding   HISTORY OF PRESENT ILLNESS  03/31/20 12:36 AM Charlotte [name] is a 84 y.o. female with a history of bleeding ulcer and anemia for the past 2 months.  She is here with 1 week of melanotic stool.  She has also been on iron tablets for about 10 days.  She is here today because she had 3 bowel movements yesterday which is more than usual.  Her daughter insisted she be evaluated.  She she has not had significant lightheadedness nor has she had chest pain.  She has had some shortness of breath with exertion but this is baseline for her.  She has chronic atrial fibrillation and is on Xarelto.  She has had some mild lower abdominal cramping.   Past Medical History:  Diagnosis Date  . Aortic insufficiency   . Atrial fibrillation ([place])    a. s/p DCCV 03/2013  . CAD (coronary artery disease)    LHC 06/2010: EF 55%, mild plaque in the LAD 20-30%, otherwise normal coronary arteries  . CATARACTS   . Chronic systolic heart failure ([place])    in setting of AFib;  Echocardiogram 02/2013: Moderate LVH, EF 25-30%, anteroseptal and apical HK, moderate AI, MAC, moderate MR, moderate LAE, mild RAE, PASP 32 => after DCCV f/u Echo 8/14: Moderate LVH, EF 60-65%, normal wall motion, grade 1 diastolic dysfunction, moderate AI, moderate LAE  . Complication of anesthesia 2009   nausea and vomitting  . GERD (gastroesophageal reflux disease)   . HYPERLIPIDEMIA, MILD   . HYPERTENSION, BENIGN SYSTEMIC   . Irritable bowel syndrome   . LBBB (left bundle branch block)   . MEDIAL EPICONDYLITIS   . NSVT (nonsustained ventricular tachycardia) ([place])    during Dob Echo 2011 => normal cath  . OSTEOARTHRITIS   . OSTEOPENIA   . PONV (postoperative nausea and vomiting)   . Pre-diabetes   . RHINITIS, ALLERGIC   . Stroke ([place])   . SYNCOPE   . VITAMIN D  DEFICIENCY     Past Surgical History:  Procedure Laterality Date  . BIOPSY  03/01/2020   Procedure: BIOPSY;  Surgeon: Otis Brace, MD;  Location: [place];  Service: Gastroenterology;;  . CARDIOVERSION N/A 04/01/2013   Procedure: CARDIOVERSION;  Surgeon: Josue Hector, MD;  Location: [facility] ENDOSCOPY;  Service: Cardiovascular;  Laterality: N/A;  . CARDIOVERSION N/A 07/16/2017   Procedure: CARDIOVERSION;  Surgeon: Pixie Casino, MD;  Location: [facility] ENDOSCOPY;  Service: Cardiovascular;  Laterality: N/A;  . CARDIOVERSION N/A 07/07/2018   Procedure: CARDIOVERSION;  Surgeon: Pixie Casino, MD;  Location: [place];  Service: Cardiovascular;  Laterality: N/A;  . CATARACT EXTRACTION, BILATERAL  2012   Dr. Herbert Deaner  . COLONOSCOPY WITH PROPOFOL N/A 05/23/2017   Procedure: COLONOSCOPY WITH PROPOFOL;  Surgeon: Wonda Horner, MD;  Location: [facility] ENDOSCOPY;  Service: Endoscopy;  Laterality: N/A;  . ESOPHAGOGASTRODUODENOSCOPY N/A 03/01/2020   Procedure: ESOPHAGOGASTRODUODENOSCOPY (EGD);  Surgeon: Otis Brace, MD;  Location: [facility] ENDOSCOPY;  Service: Gastroenterology;  Laterality: N/A;  . ESOPHAGOGASTRODUODENOSCOPY (EGD) WITH PROPOFOL N/A 05/23/2017   Procedure: ESOPHAGOGASTRODUODENOSCOPY (EGD) WITH PROPOFOL;  Surgeon: Wonda Horner, MD;  Location: [facility] ENDOSCOPY;  Service: Endoscopy;  Laterality: N/A;  . IR KYPHO LUMBAR INC FX REDUCE BONE BX UNI/BIL CANNULATION INC/IMAGING  06/06/2017  . IR RADIOLOGIST EVAL & MGMT  07/01/2017  . RADICAL HYSTERECTOMY    .  REPLACEMENT TOTAL KNEE     11/03/07 right....... 03/29/08 left    Family History  Problem Relation Age of Onset  . Arrhythmia Mother        afib  . Alzheimer's disease Mother   . Stroke Mother   . Colon cancer Father   . Heart attack Father 101  . Diabetes Sister   . Arrhythmia Sister        afib  . Stroke Maternal Grandmother     Social History   Tobacco Use  . Smoking status: Former Smoker    Quit date: 10/08/1970    Years since  quitting: 49.5  . Smokeless tobacco: Never Used  Vaping Use  . Vaping Use: Never used  Substance Use Topics  . Alcohol use: No  . Drug use: No    Prior to Admission medications   Medication Sig Start Date End Date Taking? Authorizing Provider  acetaminophen (TYLENOL) 500 MG tablet Take 1,000 mg by mouth 3 (three) times daily.    [provider]  aspirin EC 81 MG tablet Take 1 tablet (81 mg total) by mouth daily. 03/05/20   Darliss Cheney, MD  atorvastatin (LIPITOR) 40 MG tablet Take 1 tablet (40 mg total) by mouth daily. 09/23/19   Melvenia Beam, MD  carvedilol (COREG) 12.5 MG tablet Take 1 tablet (12.5 mg total) by mouth 2 (two) times daily with a meal. 01/25/20   Crenshaw, Denice Bors, MD  furosemide (LASIX) 20 MG tablet TAKE 1 TABLET TWICE A DAY Patient taking differently: Take 20 mg by mouth 2 (two) times daily.  01/25/20   Lelon Perla, MD  losartan (COZAAR) 25 MG tablet TAKE 1 TABLET BY MOUTH EVERY DAY Patient taking differently: Take 25 mg by mouth daily.  01/27/20   Josue Hector, MD  omeprazole (PRILOSEC) 40 MG capsule Take 1 capsule (40 mg total) by mouth in the morning and at bedtime. 03/10/20 04/09/20  [name], MD  polyvinyl alcohol-povidone (REFRESH) 1.4-0.6 % ophthalmic solution Place 1-2 drops into both eyes daily as needed (dry eyes).     [provider]  potassium chloride (KLOR-CON) 10 MEQ tablet TAKE 1 TABLET (10 MEQ TOTAL) BY MOUTH 2 (TWO) TIMES DAILY. Patient taking differently: Take 10 mEq by mouth 2 (two) times daily.  01/27/20   Josue Hector, MD  pregabalin (LYRICA) 100 MG capsule Take 1 capsule (100 mg total) by mouth 2 (two) times daily. 03/10/20   [name], MD  rivaroxaban (XARELTO) 20 MG TABS tablet Take 1 tablet (20 mg total) by mouth daily with supper. 03/03/20   Darliss Cheney, MD    Allergies Metformin and related and Amlodipine   REVIEW OF SYSTEMS  Negative except as noted here or in the History of Present  Illness.   PHYSICAL EXAMINATION  Initial Vital Signs Blood pressure (!) 142/72, pulse 85, temperature 98.3 F (36.8 C), resp. rate 18, height _0  (1.6 m), weight 131.5 kg, SpO2 98 %.  Examination General: Well-developed, well-nourished female in no acute distress; appearance consistent with age of record HENT: normocephalic; atraumatic Eyes: pupils equal, round and reactive to light; extraocular muscles intact; bilateral pseudophakia; pale conjunctivae Neck: supple Heart: Irregular rhythm; A. fib/a flutter on monitor Lungs: clear to auscultation bilaterally Abdomen: soft; nondistended; nontender; bowel sounds present rectal: Normal sphincter tone; stool on examining glove black, sent for Hemoccult testing Extremities: No deformity; full range of motion; 2+ pitting edema of lower legs Neurologic: Awake, alert and oriented; motor  function intact in all extremities and symmetric; no facial droop Skin: Warm and dry Psychiatric: Normal mood and affect   RESULTS  Summary of this visit's results, reviewed and interpreted by myself:   EKG Interpretation  Date/Time:    Ventricular Rate:    PR Interval:    QRS Duration:   QT Interval:    QTC Calculation:   R Axis:     Text Interpretation:        Laboratory Studies: Results for orders placed or performed during the hospital encounter of 03/30/20 (from the past 24 hour(s))  CBC with Differential     Status: Abnormal   Collection Time: 03/30/20  9:04 PM  Result Value Ref Range   WBC 6.4 4.0 - 10.5 K/uL   RBC 3.18 (L) 3.87 - 5.11 MIL/uL   Hemoglobin 8.7 (L) 12.0 - 15.0 g/dL   HCT 29.7 (L) 36 - 46 %   MCV 93.4 80.0 - 100.0 fL   MCH 27.4 26.0 - 34.0 pg   MCHC 29.3 (L) 30.0 - 36.0 g/dL   RDW 19.4 (H) 11.5 - 15.5 %   Platelets 320 150 - 400 K/uL   nRBC 0.0 0.0 - 0.2 %   Neutrophils Relative % 64 %   Neutro Abs 4.0 1.7 - 7.7 K/uL   Lymphocytes Relative 25 %   Lymphs Abs 1.6 0.7 - 4.0 K/uL   Monocytes Relative 10 %    Monocytes Absolute 0.6 0 - 1 K/uL   Eosinophils Relative 1 %   Eosinophils Absolute 0.1 0 - 0 K/uL   Basophils Relative 0 %   Basophils Absolute 0.0 0 - 0 K/uL   Immature Granulocytes 0 %   Abs Immature Granulocytes 0.02 0.00 - 0.07 K/uL  Comprehensive metabolic panel     Status: Abnormal   Collection Time: 03/30/20  9:04 PM  Result Value Ref Range   Sodium 137 135 - 145 mmol/L   Potassium 3.6 3.5 - 5.1 mmol/L   Chloride 105 98 - 111 mmol/L   CO2 23 22 - 32 mmol/L   Glucose, Bld 125 (H) 70 - 99 mg/dL   BUN 19 8 - 23 mg/dL   Creatinine, Ser 1.22 (H) 0.44 - 1.00 mg/dL   Calcium 8.2 (L) 8.9 - 10.3 mg/dL   Total Protein 6.8 6.5 - 8.1 g/dL   Albumin 3.4 (L) 3.5 - 5.0 g/dL   AST 20 15 - 41 U/L   ALT 17 0 - 44 U/L   Alkaline Phosphatase 109 38 - 126 U/L   Total Bilirubin 0.2 (L) 0.3 - 1.2 mg/dL   GFR calc non Af Amer 41 (L) >60 mL/min   GFR calc Af Amer 48 (L) >60 mL/min   Anion gap 9 5 - 15  Occult blood card to lab, stool Provider will collect     Status: None   Collection Time: 03/31/20 12:39 AM  Result Value Ref Range   Fecal Occult Bld NEGATIVE NEGATIVE   Imaging Studies: No results found.  ED COURSE and MDM  Nursing notes, initial and subsequent vitals signs, including pulse oximetry, reviewed and interpreted by myself.  Vitals:   03/30/20 2056 03/30/20 2058 03/30/20 2329 03/31/20 0041  BP:  (!) 100/50 (!) 142/72 (!) 139/91  Pulse: 88  85 85  Resp: _0 Temp: 98.3 F (36.8 C)     SpO2: 98%  98% 100%  Weight:      Height:       Medications -  No data to display  Patient has stable anemia and stable renal insufficiency.  Her stools are dark but is heme-negative.  This is likely due to iron supplementation.  I see no evidence of an acute condition requiring further work-up or admission.  PROCEDURES  Procedures   ED DIAGNOSES     ICD-10-CM   1. Dark stools  R19.5   2. Takes iron supplements  Z78.9   3. Concern about digestive disease without diagnosis   Z71.1        Breella Vanostrand, MD 03/31/20 [id number]

## 2020-03-31 NOTE — ED Notes (Signed)
Provider bedside.

## 2020-03-31 NOTE — Telephone Encounter (Signed)
Rachel advised.

## 2020-04-07 DIAGNOSIS — N179 Acute kidney failure, unspecified: Secondary | ICD-10-CM | POA: Diagnosis not present

## 2020-04-07 DIAGNOSIS — G8929 Other chronic pain: Secondary | ICD-10-CM | POA: Diagnosis not present

## 2020-04-07 DIAGNOSIS — K922 Gastrointestinal hemorrhage, unspecified: Secondary | ICD-10-CM | POA: Diagnosis not present

## 2020-04-07 DIAGNOSIS — I5022 Chronic systolic (congestive) heart failure: Secondary | ICD-10-CM | POA: Diagnosis not present

## 2020-04-07 DIAGNOSIS — I2510 Atherosclerotic heart disease of native coronary artery without angina pectoris: Secondary | ICD-10-CM | POA: Diagnosis not present

## 2020-04-07 DIAGNOSIS — M545 Low back pain: Secondary | ICD-10-CM | POA: Diagnosis not present

## 2020-04-07 DIAGNOSIS — G629 Polyneuropathy, unspecified: Secondary | ICD-10-CM | POA: Diagnosis not present

## 2020-04-07 DIAGNOSIS — B961 Klebsiella pneumoniae [K. pneumoniae] as the cause of diseases classified elsewhere: Secondary | ICD-10-CM | POA: Diagnosis not present

## 2020-04-07 DIAGNOSIS — N390 Urinary tract infection, site not specified: Secondary | ICD-10-CM | POA: Diagnosis not present

## 2020-04-07 DIAGNOSIS — I110 Hypertensive heart disease with heart failure: Secondary | ICD-10-CM | POA: Diagnosis not present

## 2020-04-08 DIAGNOSIS — I5022 Chronic systolic (congestive) heart failure: Secondary | ICD-10-CM | POA: Diagnosis not present

## 2020-04-12 ENCOUNTER — Ambulatory Visit (INDEPENDENT_AMBULATORY_CARE_PROVIDER_SITE_OTHER): Payer: Medicare HMO | Admitting: Family Medicine

## 2020-04-12 ENCOUNTER — Encounter: Payer: Self-pay | Admitting: Family Medicine

## 2020-04-12 VITALS — BP 112/69 | HR 94 | Ht 63.0 in | Wt 293.0 lb

## 2020-04-12 DIAGNOSIS — K254 Chronic or unspecified gastric ulcer with hemorrhage: Secondary | ICD-10-CM | POA: Diagnosis not present

## 2020-04-12 DIAGNOSIS — E669 Obesity, unspecified: Secondary | ICD-10-CM

## 2020-04-12 DIAGNOSIS — D500 Iron deficiency anemia secondary to blood loss (chronic): Secondary | ICD-10-CM | POA: Diagnosis not present

## 2020-04-12 DIAGNOSIS — I10 Essential (primary) hypertension: Secondary | ICD-10-CM

## 2020-04-12 DIAGNOSIS — E1169 Type 2 diabetes mellitus with other specified complication: Secondary | ICD-10-CM

## 2020-04-12 DIAGNOSIS — I6381 Other cerebral infarction due to occlusion or stenosis of small artery: Secondary | ICD-10-CM | POA: Diagnosis not present

## 2020-04-12 DIAGNOSIS — M48061 Spinal stenosis, lumbar region without neurogenic claudication: Secondary | ICD-10-CM | POA: Diagnosis not present

## 2020-04-12 LAB — POCT GLYCOSYLATED HEMOGLOBIN (HGB A1C): Hemoglobin A1C: 5.1 % (ref 4.0–5.6)

## 2020-04-12 MED ORDER — PREGABALIN 25 MG PO CAPS
ORAL_CAPSULE | ORAL | 0 refills | Status: DC
Start: 2020-04-12 — End: 2020-06-16

## 2020-04-12 NOTE — Assessment & Plan Note (Addendum)
Well controlled.  In fact blood pressure is a little borderline low.  Since she is dropped about 15 pounds some of it fluid I do want to be careful about not over diuresing her with her furosemide.  Continue to monitor swelling and plan to recheck BMP at follow-up.  Continue current regimen. Follow up in  90mo

## 2020-04-12 NOTE — Assessment & Plan Note (Signed)
Once he looks fantastic in fact is back down to the normal range no longer in prediabetes she is not currently on any medication.  She says she is cut back on bread and just really encouraged her to stick with that plan.

## 2020-04-12 NOTE — Assessment & Plan Note (Signed)
Now off of aspirin just on Xarelto.  She is also on a PPI twice a day.  I feel much more comfortable with this plan the being on 2 anticoagulants especially high risk with recent hospitalization for GI bleed and at age 84.

## 2020-04-12 NOTE — Assessment & Plan Note (Signed)
He still having some dark stools but says she has not seen any bright red blood in over a week.  I suspect that the dark stools may be from her iron supplementation which she is still taking.

## 2020-04-12 NOTE — Progress Notes (Signed)
Established Patient Office Visit  Subjective:  Patient ID: Charlotte [name], female    DOB: 04-Oct-1936  Age: 84 y.o. MRN: [id number]  CC:  Chief Complaint  Patient presents with  . Diabetes    HPI [name] presents for   Diabetes - no hypoglycemic events. No wounds or sores that are not healing well. No increased thirst or urination. Checking glucose at home. Taking medications as prescribed without any side effects.  Hypertension- Pt denies chest pain, SOB, dizziness, or heart palpitations.  Taking meds as directed w/o problems.  Denies medication side effects.    She has lost 15 lbs since she was last here.  She dec her lyrica to 164m once a day about 2 weeks ago per neurology thinking that it could be contributing to some of her swelling and she actually is down about 15 pounds she says she really has not noticed any significa decrease in pain control with cutting her dose in half..     Past Medical History:  Diagnosis Date  . Aortic insufficiency   . [place]    a. s/p DCCV 03/2013  . CAD (coronary artery disease)    LHC 06/2010: EF 55%, mild plaque in the LAD 20-30%, otherwise normal coronary arteries  . CATARACTS   . [place]    in setting of AFib;  Echocardiogram 02/2013: Moderate LVH, EF 25-30%, anteroseptal and apical HK, moderate AI, MAC, moderate MR, moderate LAE, mild RAE, PASP 32 => after DCCV f/u Echo 8/14: Moderate LVH, EF 60-65%, normal wall motion, grade 1 diastolic dysfunction, moderate AI, moderate LAE  . Complication of anesthesia 2009   nausea and vomitting  . GERD (gastroesophageal reflux disease)   . HYPERLIPIDEMIA, MILD   . HYPERTENSION, BENIGN SYSTEMIC   . Irritable bowel syndrome   . LBBB (left bundle branch block)   . MEDIAL EPICONDYLITIS   . NS[place] (n[place] tachycardia) [place]    during [place] Echo 2011 => normal cath  . OSTEOARTHRITIS   . OSTEOPENIA   . PONV (postoperative nausea and  vomiting)   . Pre-diabetes   . RHINITIS, ALLERGIC   . [place]   . SYNCOPE   . VITAMIN D DEFICIENCY     Past Surgical History:  Procedure Laterality Date  . BIOPSY  03/01/2020   Procedure: BIOPSY;  Surgeon: BOtis Brace MD;  [place]  Service: Gastroenterology;;  . CARDIOVERSION N/A 04/01/2013   Procedure: CARDIOVERSION;  Surgeon: PJosue Hector MD;  [facility];  Service: Cardiovascular;  Laterality: N/A;  . CARDIOVERSION N/A 07/16/2017   Procedure: CARDIOVERSION;  Surgeon: HPixie Casino MD;  [facility];  Service: Cardiovascular;  Laterality: N/A;  . CARDIOVERSION N/A 07/07/2018   Procedure: CARDIOVERSION;  Surgeon: HPixie Casino MD;  [place]  Service: Cardiovascular;  Laterality: N/A;  . CATARACT EXTRACTION, BILATERAL  2012   Dr. HHerbert Deaner . COLONOSCOPY WITH PROPOFOL N/A 05/23/2017   Procedure: COLONOSCOPY WITH PROPOFOL;  Surgeon: GWonda Horner MD;  [facility];  Service: Endoscopy;  Laterality: N/A;  . ESOPHAGOGASTRODUODENOSCOPY N/A 03/01/2020   Procedure: ESOPHAGOGASTRODUODENOSCOPY (EGD);  Surgeon: BOtis Brace MD;  [facility];  Service: Gastroenterology;  Laterality: N/A;  . ESOPHAGOGASTRODUODENOSCOPY (EGD) WITH PROPOFOL N/A 05/23/2017   Procedure: ESOPHAGOGASTRODUODENOSCOPY (EGD) WITH PROPOFOL;  Surgeon: GWonda Horner MD;  [facility];  Service: Endoscopy;  Laterality: N/A;  . IR KYPHO LUMBAR INC FX REDUCE BONE BX UNI/BIL CANNULATION  INC/IMAGING  06/06/2017  . IR RADIOLOGIST EVAL & MGMT  07/01/2017  . RADICAL HYSTERECTOMY    . REPLACEMENT TOTAL KNEE     11/03/07 right....... 03/29/08 left    Family History  Problem Relation Age of Onset  . Arrhythmia Mother        afib  . Alzheimer's disease Mother   . Stroke Mother   . Colon cancer Father   . Heart attack Father 48  . Diabetes Sister   . Arrhythmia Sister        afib  . Stroke Maternal Grandmother     Social  History   Socioeconomic History  . Marital status: Divorced    Spouse name: Not on file  . Number of children: 1  . Years of education: Not on file  . Highest education level: High school graduate  Occupational History  . Occupation: Retired    Fish farm manager: RETIRED  Tobacco Use  . Smoking status: Former Smoker    Quit date: 10/08/1970    Years since quitting: 49.5  . Smokeless tobacco: Never Used  Vaping Use  . Vaping Use: Never used  Substance and Sexual Activity  . Alcohol use: No  . Drug use: No  . Sexual activity: Not on file  Other Topics Concern  . Not on file  Social History Narrative   Lives by herself at home   Right handed   Caffeine: 1 cup/day at the most. Recently switched to decaf coffee.   Social Determinants of Health   Financial Resource Strain:   . Difficulty of Paying Living Expenses:   Food Insecurity:   . Worried About Charity fundraiser in the Last Year:   . Arboriculturist in the Last Year:   Transportation Needs:   . Film/video editor (Medical):   Marland Kitchen Lack of Transportation (Non-Medical):   Physical Activity:   . Days of Exercise per Week:   . Minutes of Exercise per Session:   Stress:   . Feeling of Stress :   Social Connections:   . Frequency of Communication with Friends and Family:   . Frequency of Social Gatherings with Friends and Family:   . Attends Religious Services:   . Active Member of Clubs or Organizations:   . Attends Archivist Meetings:   Marland Kitchen Marital Status:   Intimate Partner Violence:   . Fear of Current or Ex-Partner:   . Emotionally Abused:   Marland Kitchen Physically Abused:   . Sexually Abused:     Outpatient Medications Prior to Visit  Medication Sig Dispense Refill  . acetaminophen (TYLENOL) 500 MG tablet Take 1,000 mg by mouth 3 (three) times daily.    Marland Kitchen atorvastatin (LIPITOR) 40 MG tablet Take 1 tablet (40 mg total) by mouth daily. 90 tablet 6  . carvedilol (COREG) 12.5 MG tablet Take 1 tablet (12.5 mg total) by  mouth 2 (two) times daily with a meal. 60 tablet 4  . furosemide (LASIX) 20 MG tablet TAKE 1 TABLET TWICE A DAY (Patient taking differently: Take 20 mg by mouth 2 (two) times daily. ) 60 tablet 4  . losartan (COZAAR) 25 MG tablet TAKE 1 TABLET BY MOUTH EVERY DAY (Patient taking differently: Take 25 mg by mouth daily. ) 90 tablet 1  . omeprazole (PRILOSEC) 40 MG capsule Take 1 capsule (40 mg total) by mouth in the morning and at bedtime. 180 capsule 0  . polyvinyl alcohol-povidone (REFRESH) 1.4-0.6 % ophthalmic solution Place 1-2 drops into both  eyes daily as needed (dry eyes).     . potassium chloride (KLOR-CON) 10 MEQ tablet TAKE 1 TABLET (10 MEQ TOTAL) BY MOUTH 2 (TWO) TIMES DAILY. (Patient taking differently: Take 10 mEq by mouth 2 (two) times daily. ) 180 tablet 1  . rivaroxaban (XARELTO) 20 MG TABS tablet Take 1 tablet (20 mg total) by mouth daily with supper. 90 tablet 0  . pregabalin (LYRICA) 100 MG capsule Take 1 capsule (100 mg total) by mouth 2 (two) times daily. 180 capsule 1  . aspirin EC 81 MG tablet Take 1 tablet (81 mg total) by mouth daily. 30 tablet 0   No facility-administered medications prior to visit.    Allergies  Allergen Reactions  . Metformin And Related Other (See Comments)    Renal failure  . Amlodipine Other (See Comments)    HEADACHE    ROS Review of Systems    Objective:    Physical Exam Constitutional:      Appearance: She is well-developed.  HENT:     Head: Normocephalic and atraumatic.  Cardiovascular:     Rate and Rhythm: Normal rate and regular rhythm.     Heart sounds: Normal heart sounds.  Pulmonary:     Effort: Pulmonary effort is normal.     Breath sounds: Normal breath sounds.  Musculoskeletal:     Comments: Plus pitting edema of both ankles and lower legs bilaterally.  But I do actually think that they look better than the last time she was here.  Skin:    General: Skin is warm and dry.  Neurological:     Mental Status: She is alert  and oriented to person, place, and time.  Psychiatric:        Behavior: Behavior normal.     BP 112/69   Pulse 94   Ht _0  (1.6 m)   Wt 293 lb (132.9 kg)   SpO2 98%   BMI 51.90 kg/m  Wt Readings from Last 3 Encounters:  04/12/20 293 lb (132.9 kg)  03/30/20 290 lb (131.5 kg)  03/24/20 (!) 305 lb (138.3 kg)     Health Maintenance Due  Topic Date Due  . FOOT EXAM  04/12/2020    There are no preventive care reminders to display for this patient.  Lab Results  Component Value Date   TSH 2.31 09/17/2019   Lab Results  Component Value Date   WBC 6.4 03/30/2020   HGB 8.7 (L) 03/30/2020   HCT 29.7 (L) 03/30/2020   MCV 93.4 03/30/2020   PLT 320 03/30/2020   Lab Results  Component Value Date   NA 137 03/30/2020   K 3.6 03/30/2020   CO2 23 03/30/2020   GLUCOSE 125 (H) 03/30/2020   BUN 19 03/30/2020   CREATININE 1.22 (H) 03/30/2020   BILITOT 0.2 (L) 03/30/2020   ALKPHOS 109 03/30/2020   AST 20 03/30/2020   ALT 17 03/30/2020   PROT 6.8 03/30/2020   ALBUMIN 3.4 (L) 03/30/2020   CALCIUM 8.2 (L) 03/30/2020   ANIONGAP 9 03/30/2020   GFR 63.15 05/22/2013   Lab Results  Component Value Date   CHOL 126 01/13/2020   Lab Results  Component Value Date   HDL 41 (L) 01/13/2020   Lab Results  Component Value Date   LDLCALC 70 01/13/2020   Lab Results  Component Value Date   TRIG 69 01/13/2020   Lab Results  Component Value Date   CHOLHDL 3.1 01/13/2020   Lab Results  Component Value Date  HGBA1C 5.1 04/12/2020      Assessment & Plan:   Problem List Items Addressed This Visit      Cardiovascular and Mediastinum   Essential hypertension - Primary    Well controlled.  In fact blood pressure is a little borderline low.  Since she is dropped about 15 pounds some of it fluid I do want to be careful about not over diuresing her with her furosemide.  Continue to monitor swelling and plan to recheck BMP at follow-up.  Continue current regimen. Follow up in  55mo         Digestive   Gastrointestinal hemorrhage    He still having some dark stools but says she has not seen any bright red blood in over a week.  I suspect that the dark stools may be from her iron supplementation which she is still taking.        Endocrine   Diabetes mellitus type 2 in obese ([facility]    Once he looks fantastic in fact is back down to the normal range no longer in prediabetes she is not currently on any medication.  She says she is cut back on bread and just really encouraged her to stick with that plan.      Relevant Orders   POCT glycosylated hemoglobin (Hb A1C) (Completed)     Nervous and Auditory   [place]ar stroke ([place]    Now off of aspirin just on Xarelto.  She is also on a PPI twice a day.  I feel much more comfortable with this plan the being on 2 anticoagulants especially high risk with recent hospitalization for GI bleed and at age 89        Other   Spinal stenosis of lumbar region without neurogenic claudication    No change with pain control on half her dose of Lyrica. Will continue to taper and see if has been effective at all.  Ok to start using her Tylenol again.         Other Visit Diagnoses    Iron deficiency anemia due to chronic blood loss         Iron deficiency anemia plan to recheck iron levels at follow-up visit in 2 months.  Meds ordered this encounter  Medications  . pregabalin (LYRICA) 25 MG capsule    Sig: 2 tabs PO QHS x 2 weeks then decrease to 1 tab PO QHS x 2 weeks and then stop.    Dispense:  45 capsule    Refill:  0    Follow-up: Return in about 2 months (around 06/13/2020) for recheck weight and BP .    CBeatrice Lecher MD

## 2020-04-12 NOTE — Assessment & Plan Note (Signed)
No change with pain control on half her dose of Lyrica. Will continue to taper and see if has been effective at all.  Ok to start using her Tylenol again.

## 2020-04-13 DIAGNOSIS — B961 Klebsiella pneumoniae [K. pneumoniae] as the cause of diseases classified elsewhere: Secondary | ICD-10-CM | POA: Diagnosis not present

## 2020-04-13 DIAGNOSIS — I110 Hypertensive heart disease with heart failure: Secondary | ICD-10-CM | POA: Diagnosis not present

## 2020-04-13 DIAGNOSIS — I2510 Atherosclerotic heart disease of native coronary artery without angina pectoris: Secondary | ICD-10-CM | POA: Diagnosis not present

## 2020-04-13 DIAGNOSIS — N179 Acute kidney failure, unspecified: Secondary | ICD-10-CM | POA: Diagnosis not present

## 2020-04-13 DIAGNOSIS — I5022 Chronic systolic (congestive) heart failure: Secondary | ICD-10-CM | POA: Diagnosis not present

## 2020-04-13 DIAGNOSIS — N390 Urinary tract infection, site not specified: Secondary | ICD-10-CM | POA: Diagnosis not present

## 2020-04-13 DIAGNOSIS — M545 Low back pain: Secondary | ICD-10-CM | POA: Diagnosis not present

## 2020-04-13 DIAGNOSIS — K922 Gastrointestinal hemorrhage, unspecified: Secondary | ICD-10-CM | POA: Diagnosis not present

## 2020-04-13 DIAGNOSIS — G629 Polyneuropathy, unspecified: Secondary | ICD-10-CM | POA: Diagnosis not present

## 2020-04-13 DIAGNOSIS — G8929 Other chronic pain: Secondary | ICD-10-CM | POA: Diagnosis not present

## 2020-04-14 ENCOUNTER — Institutional Professional Consult (permissible substitution): Payer: Medicare HMO | Admitting: Neurology

## 2020-04-21 ENCOUNTER — Emergency Department (INDEPENDENT_AMBULATORY_CARE_PROVIDER_SITE_OTHER)
Admission: EM | Admit: 2020-04-21 | Discharge: 2020-04-21 | Disposition: A | Discharge status: Home / Self Care | Payer: Medicare HMO | Source: Home / Self Care | Attending: Family Medicine | Admitting: Family Medicine

## 2020-04-21 DIAGNOSIS — J069 Acute upper respiratory infection, unspecified: Secondary | ICD-10-CM

## 2020-04-21 DIAGNOSIS — J029 Acute pharyngitis, unspecified: Secondary | ICD-10-CM | POA: Diagnosis not present

## 2020-04-21 LAB — POCT RAPID STREP A (OFFICE): Rapid Strep A Screen: NEGATIVE

## 2020-04-21 MED ORDER — LIDOCAINE VISCOUS HCL 2 % MT SOLN
15.0000 mL | Freq: Three times a day (TID) | OROMUCOSAL | 0 refills | Status: DC
Start: 2020-04-21 — End: 2020-05-12

## 2020-04-21 MED ORDER — CEFDINIR 300 MG PO CAPS
300.0000 mg | ORAL_CAPSULE | Freq: Two times a day (BID) | ORAL | 0 refills | Status: DC
Start: 2020-04-21 — End: 2020-05-12

## 2020-04-21 NOTE — ED Triage Notes (Signed)
A week ago tomorrow started with a sore throat.  Has been in the hospital with a bleeding ulcer.  Sounds hoarse to daughter.  Poor appetite, and tongue is brown.  Has been using cough drops.  Denies fever.

## 2020-04-21 NOTE — Discharge Instructions (Addendum)
Take plain guaifenesin (1200mg  extended release tabs such as Mucinex) twice daily, with plenty of water, for cough and congestion. Get adequate rest.   May take Delsym Cough Suppressant ("12 Hour Cough Relief") at bedtime for nighttime cough.  Try warm salt water gargles for sore throat.  Stop all antihistamines for now, and other non-prescription cough/cold preparations. May take Tylenol as needed for pain, headache, body aches, etc. Begin Omnicef if not improving about one week or if persistent fever develops

## 2020-04-21 NOTE — ED Provider Notes (Signed)
[facility]    CSN: [id number] Arrival date & time: 04/21/20  1138      History   Chief Complaint Chief Complaint  Patient presents with  . Sore Throat    HPI Charlotte [name] is a 84 y.o. female.   Patient developed a mild sore throat about six days ago, followed by nasal congestion and a mild cough.  She denies fevers, chills, and sweats, pleuritic pain, and shortness of breath. She was hospitalized two months ago for a bleeding ulcer; she denies abdominal pain and hematochezia or melena.  The history is provided by the patient and a relative.    Past Medical History:  Diagnosis Date  . Aortic insufficiency   . Atrial fibrillation ([place])    a. s/p DCCV 03/2013  . CAD (coronary artery disease)    LHC 06/2010: EF 55%, mild plaque in the LAD 20-30%, otherwise normal coronary arteries  . CATARACTS   . Chronic systolic heart failure ([place])    in setting of AFib;  Echocardiogram 02/2013: Moderate LVH, EF 25-30%, anteroseptal and apical HK, moderate AI, MAC, moderate MR, moderate LAE, mild RAE, PASP 32 => after DCCV f/u Echo 8/14: Moderate LVH, EF 60-65%, normal wall motion, grade 1 diastolic dysfunction, moderate AI, moderate LAE  . Complication of anesthesia 2009   nausea and vomitting  . GERD (gastroesophageal reflux disease)   . HYPERLIPIDEMIA, MILD   . HYPERTENSION, BENIGN SYSTEMIC   . Irritable bowel syndrome   . LBBB (left bundle branch block)   . MEDIAL EPICONDYLITIS   . NSVT (nonsustained ventricular tachycardia) ([place])    during Dob Echo 2011 => normal cath  . OSTEOARTHRITIS   . OSTEOPENIA   . PONV (postoperative nausea and vomiting)   . Pre-diabetes   . RHINITIS, ALLERGIC   . Stroke ([place])   . SYNCOPE   . VITAMIN D DEFICIENCY     Patient Active Problem List   Diagnosis Date Noted  . Nocturia 01/07/2020  . Lacunar stroke ([place]) 09/28/2019    Class: History of  . Stasis, venous 04/13/2019  . AKI (acute kidney injury) ([place]) 07/13/2017  . NICM  (nonischemic cardiomyopathy) ([place]) 07/12/2017  . Low back pain without sciatica 06/05/2017  . Compression fracture of L3 lumbar vertebra 06/02/2017  . Low back pain 06/01/2017  . Falls 06/01/2017  . Intractable back pain 06/01/2017  . Arteriosclerosis of abdominal aorta ([place]) 05/30/2017  . Lumbosacral strain 05/29/2017  . Symptomatic anemia 05/21/2017  . Hyponatremia 05/21/2017  . Gastrointestinal hemorrhage   . Chronic constipation 05/10/2015  . Atrial fibrillation ([place]) 11/10/2014  . Severe obesity (BMI >= 40) ([place]) 06/18/2014  . Spinal stenosis of lumbar region without neurogenic claudication 03/30/2014  . Restless legs syndrome (RLS) 03/30/2014  . Idiopathic progressive polyneuropathy 03/30/2014  . Aortic regurgitation 03/03/2014  . Diabetes mellitus type 2 in obese ([place]) 12/09/2013  . Long term current use of anticoagulant therapy 05/14/2013  . VITAMIN D DEFICIENCY 10/31/2010  . VENTRICULAR TACHYCARDIA 05/31/2010  . DYSPNEA 04/24/2010  . CATARACTS 04/20/2010  . Edema 02/28/2010  . ELECTROCARDIOGRAM, ABNORMAL 09/19/2007  . Osteoarthritis of both ankles 01/01/2007  . Hyperlipidemia 08/13/2006  . Essential hypertension 07/16/2006  . RHINITIS, ALLERGIC 07/16/2006  . GASTROESOPHAGEAL REFLUX, NO ESOPHAGITIS 07/16/2006  . IRRITABLE BOWEL SYNDROME 07/16/2006  . OSTEOPENIA 07/16/2006    Past Surgical History:  Procedure Laterality Date  . BIOPSY  03/01/2020   Procedure: BIOPSY;  Surgeon: Otis Brace, MD;  Location: [place];  Service: Gastroenterology;;  .  CARDIOVERSION N/A 04/01/2013   Procedure: CARDIOVERSION;  Surgeon: Josue Hector, MD;  Location: [facility] ENDOSCOPY;  Service: Cardiovascular;  Laterality: N/A;  . CARDIOVERSION N/A 07/16/2017   Procedure: CARDIOVERSION;  Surgeon: Pixie Casino, MD;  Location: [facility] ENDOSCOPY;  Service: Cardiovascular;  Laterality: N/A;  . CARDIOVERSION N/A 07/07/2018   Procedure: CARDIOVERSION;  Surgeon: Pixie Casino, MD;  Location: [place];  Service: Cardiovascular;  Laterality: N/A;  . CATARACT EXTRACTION, BILATERAL  2012   Dr. Herbert Deaner  . COLONOSCOPY WITH PROPOFOL N/A 05/23/2017   Procedure: COLONOSCOPY WITH PROPOFOL;  Surgeon: Wonda Horner, MD;  Location: [facility] ENDOSCOPY;  Service: Endoscopy;  Laterality: N/A;  . ESOPHAGOGASTRODUODENOSCOPY N/A 03/01/2020   Procedure: ESOPHAGOGASTRODUODENOSCOPY (EGD);  Surgeon: Otis Brace, MD;  Location: [facility] ENDOSCOPY;  Service: Gastroenterology;  Laterality: N/A;  . ESOPHAGOGASTRODUODENOSCOPY (EGD) WITH PROPOFOL N/A 05/23/2017   Procedure: ESOPHAGOGASTRODUODENOSCOPY (EGD) WITH PROPOFOL;  Surgeon: Wonda Horner, MD;  Location: [facility] ENDOSCOPY;  Service: Endoscopy;  Laterality: N/A;  . IR KYPHO LUMBAR INC FX REDUCE BONE BX UNI/BIL CANNULATION INC/IMAGING  06/06/2017  . IR RADIOLOGIST EVAL & MGMT  07/01/2017  . RADICAL HYSTERECTOMY    . REPLACEMENT TOTAL KNEE     11/03/07 right....... 03/29/08 left    OB History   No obstetric history on file.      Home Medications    Prior to Admission medications   Medication Sig Start Date End Date Taking? Authorizing Provider  acetaminophen (TYLENOL) 500 MG tablet Take 1,000 mg by mouth 3 (three) times daily.    [provider]  atorvastatin (LIPITOR) 40 MG tablet Take 1 tablet (40 mg total) by mouth daily. 09/23/19   Melvenia Beam, MD  carvedilol (COREG) 12.5 MG tablet Take 1 tablet (12.5 mg total) by mouth 2 (two) times daily with a meal. 01/25/20   Crenshaw, Denice Bors, MD  cefdinir (OMNICEF) 300 MG capsule Take 1 capsule (300 mg total) by mouth 2 (two) times daily. 04/21/20   Kandra Nicolas, MD  furosemide (LASIX) 20 MG tablet TAKE 1 TABLET TWICE A DAY Patient taking differently: Take 20 mg by mouth 2 (two) times daily.  01/25/20   Lelon Perla, MD  lidocaine (XYLOCAINE) 2 % solution Use as directed 15 mLs in the mouth or throat 4 (four) times daily -  before meals and at bedtime. Gargle, then expectorate 04/21/20   Kandra Nicolas, MD  losartan (COZAAR) 25 MG tablet TAKE 1 TABLET BY MOUTH EVERY DAY Patient taking differently: Take 25 mg by mouth daily.  01/27/20   Josue Hector, MD  omeprazole (PRILOSEC) 40 MG capsule Take 1 capsule (40 mg total) by mouth in the morning and at bedtime. 03/10/20 04/12/20  [name], MD  polyvinyl alcohol-povidone (REFRESH) 1.4-0.6 % ophthalmic solution Place 1-2 drops into both eyes daily as needed (dry eyes).     [provider]  potassium chloride (KLOR-CON) 10 MEQ tablet TAKE 1 TABLET (10 MEQ TOTAL) BY MOUTH 2 (TWO) TIMES DAILY. Patient taking differently: Take 10 mEq by mouth 2 (two) times daily.  01/27/20   Josue Hector, MD  pregabalin (LYRICA) 25 MG capsule 2 tabs PO QHS x 2 weeks then decrease to 1 tab PO QHS x 2 weeks and then stop. 04/12/20   [name], MD  rivaroxaban (XARELTO) 20 MG TABS tablet Take 1 tablet (20 mg total) by mouth daily with supper. 03/03/20   Darliss Cheney, MD    Family History  Family History  Problem Relation Age of Onset  . Arrhythmia Mother        afib  . Alzheimer's disease Mother   . Stroke Mother   . Colon cancer Father   . Heart attack Father 62  . Diabetes Sister   . Arrhythmia Sister        afib  . Stroke Maternal Grandmother     Social History Social History   Tobacco Use  . Smoking status: Former Smoker    Quit date: 10/08/1970    Years since quitting: 49.5  . Smokeless tobacco: Never Used  Vaping Use  . Vaping Use: Never used  Substance Use Topics  . Alcohol use: No  . Drug use: No     Allergies   Metformin and related and Amlodipine   Review of Systems Review of Systems + sore throat + cough No pleuritic pain No wheezing + nasal congestion + post-nasal drainage No sinus pain/pressure No itchy/red eyes No earache No hemoptysis No SOB No fever/chills No nausea No vomiting No abdominal pain No diarrhea No urinary symptoms No skin rash + fatigue No myalgias No  headache Used OTC meds ("cough drops") without relief   Physical Exam Triage Vital Signs ED Triage Vitals  Enc Vitals Group     BP 04/21/20 1159 136/79     Pulse Rate 04/21/20 1159 99     Resp 04/21/20 1159 20     Temp 04/21/20 1201 98.5 F (36.9 C)     Temp Source 04/21/20 1159 Oral     SpO2 04/21/20 1159 95 %     Weight 04/21/20 1201 294 lb (133.4 kg)     Height 04/21/20 1201 _0  (1.6 m)     Head Circumference --      Peak Flow --      Pain Score 04/21/20 1159 0     Pain Loc --      Pain Edu? --      Excl. in [place]? --    No data found.  Updated Vital Signs BP 136/79 (BP Location: Left Arm)   Pulse 99   Temp 98.5 F (36.9 C) (Oral)   Resp 20   Ht _1  (1.6 m)   Wt 133.4 kg   SpO2 95%   BMI 52.08 kg/m   Visual Acuity Right Eye Distance:   Left Eye Distance:   Bilateral Distance:    Right Eye Near:   Left Eye Near:    Bilateral Near:     Physical Exam Nursing notes and Vital Signs reviewed. Appearance:  Patient appears stated age, and in no acute distress.  She is unable to climb onto exam table. Eyes:  Pupils are equal, round, and reactive to light and accomodation.  Extraocular movement is intact.  Conjunctivae are not inflamed  Ears:  Canals normal.  Tympanic membranes normal.  Nose:  Mildly congested turbinates.  No sinus tenderness.  Pharynx:  Minimal erythema. Neck:  Supple.  Mildly enlarged lateral nodes are present, tender to palpation on the left.   Lungs:  Clear to auscultation.  Breath sounds are equal.  Moving air well. Heart:  Regular rate and rhythm without murmurs, rubs, or gallops.  Abdomen:  Nontender Extremities:   Edema present.  Skin:  No rash present.   UC Treatments / Results  Labs (all labs ordered are listed, but only abnormal results are displayed) Labs Reviewed  POCT RAPID STREP A (OFFICE) negative    EKG   Radiology No  results found.  Procedures Procedures (including critical care time)  Medications Ordered in  UC Medications - No data to display  Initial Impression / Assessment and Plan / UC Course  I have reviewed the triage vital signs and the nursing notes.  Pertinent labs & imaging results that were available during my care of the patient were reviewed by me and considered in my medical decision making (see chart for details).    There is no evidence of bacterial infection today.  Treat symptomatically for now.  Rx for lidocaine viscous. Followup with Family Doctor if not improved in about 10 days.   Final Clinical Impressions(s) / UC Diagnoses   Final diagnoses:  Pharyngitis, unspecified etiology  Viral URI with cough     Discharge Instructions     Take plain guaifenesin (1254m extended release tabs such as Mucinex) twice daily, with plenty of water, for cough and congestion. Get adequate rest.   May take Delsym Cough Suppressant ("12 Hour Cough Relief") at bedtime for nighttime cough.  Try warm salt water gargles for sore throat.  Stop all antihistamines for now, and other non-prescription cough/cold preparations. May take Tylenol as needed for pain, headache, body aches, etc. Begin Omnicef if not improving about one week or if persistent fever develops (Given a prescription to hold, with an expiration date)      ED Prescriptions    Medication Sig Dispense Auth. Provider   cefdinir (OMNICEF) 300 MG capsule Take 1 capsule (300 mg total) by mouth 2 (two) times daily. 14 capsule BKandra Nicolas MD   lidocaine (XYLOCAINE) 2 % solution Use as directed 15 mLs in the mouth or throat 4 (four) times daily -  before meals and at bedtime. Gargle, then expectorate 200 mL BKandra Nicolas MD        BKandra Nicolas MD 04/23/20 [phone]

## 2020-04-22 ENCOUNTER — Ambulatory Visit: Payer: Medicare HMO | Admitting: Physician Assistant

## 2020-04-25 DIAGNOSIS — I5022 Chronic systolic (congestive) heart failure: Secondary | ICD-10-CM | POA: Diagnosis not present

## 2020-04-25 DIAGNOSIS — M545 Low back pain: Secondary | ICD-10-CM | POA: Diagnosis not present

## 2020-04-25 DIAGNOSIS — I2510 Atherosclerotic heart disease of native coronary artery without angina pectoris: Secondary | ICD-10-CM | POA: Diagnosis not present

## 2020-04-25 DIAGNOSIS — G629 Polyneuropathy, unspecified: Secondary | ICD-10-CM | POA: Diagnosis not present

## 2020-04-25 DIAGNOSIS — G8929 Other chronic pain: Secondary | ICD-10-CM | POA: Diagnosis not present

## 2020-04-25 DIAGNOSIS — N390 Urinary tract infection, site not specified: Secondary | ICD-10-CM | POA: Diagnosis not present

## 2020-04-25 DIAGNOSIS — K922 Gastrointestinal hemorrhage, unspecified: Secondary | ICD-10-CM | POA: Diagnosis not present

## 2020-04-25 DIAGNOSIS — N179 Acute kidney failure, unspecified: Secondary | ICD-10-CM | POA: Diagnosis not present

## 2020-04-25 DIAGNOSIS — I110 Hypertensive heart disease with heart failure: Secondary | ICD-10-CM | POA: Diagnosis not present

## 2020-04-25 DIAGNOSIS — B961 Klebsiella pneumoniae [K. pneumoniae] as the cause of diseases classified elsewhere: Secondary | ICD-10-CM | POA: Diagnosis not present

## 2020-04-28 DIAGNOSIS — N390 Urinary tract infection, site not specified: Secondary | ICD-10-CM | POA: Diagnosis not present

## 2020-04-28 DIAGNOSIS — I2510 Atherosclerotic heart disease of native coronary artery without angina pectoris: Secondary | ICD-10-CM | POA: Diagnosis not present

## 2020-04-28 DIAGNOSIS — N179 Acute kidney failure, unspecified: Secondary | ICD-10-CM | POA: Diagnosis not present

## 2020-04-28 DIAGNOSIS — B961 Klebsiella pneumoniae [K. pneumoniae] as the cause of diseases classified elsewhere: Secondary | ICD-10-CM | POA: Diagnosis not present

## 2020-04-28 DIAGNOSIS — I110 Hypertensive heart disease with heart failure: Secondary | ICD-10-CM | POA: Diagnosis not present

## 2020-04-28 DIAGNOSIS — M545 Low back pain: Secondary | ICD-10-CM | POA: Diagnosis not present

## 2020-04-28 DIAGNOSIS — I5022 Chronic systolic (congestive) heart failure: Secondary | ICD-10-CM | POA: Diagnosis not present

## 2020-04-28 DIAGNOSIS — G8929 Other chronic pain: Secondary | ICD-10-CM | POA: Diagnosis not present

## 2020-04-28 DIAGNOSIS — K922 Gastrointestinal hemorrhage, unspecified: Secondary | ICD-10-CM | POA: Diagnosis not present

## 2020-04-28 DIAGNOSIS — G629 Polyneuropathy, unspecified: Secondary | ICD-10-CM | POA: Diagnosis not present

## 2020-05-12 ENCOUNTER — Encounter: Payer: Self-pay | Admitting: Neurology

## 2020-05-12 ENCOUNTER — Ambulatory Visit: Payer: Medicare HMO | Admitting: Neurology

## 2020-05-12 VITALS — BP 138/84 | HR 103 | Ht 63.0 in | Wt 297.0 lb

## 2020-05-12 DIAGNOSIS — I480 Paroxysmal atrial fibrillation: Secondary | ICD-10-CM | POA: Diagnosis not present

## 2020-05-12 DIAGNOSIS — I6381 Other cerebral infarction due to occlusion or stenosis of small artery: Secondary | ICD-10-CM | POA: Diagnosis not present

## 2020-05-12 DIAGNOSIS — I5042 Chronic combined systolic (congestive) and diastolic (congestive) heart failure: Secondary | ICD-10-CM | POA: Diagnosis not present

## 2020-05-12 DIAGNOSIS — R0601 Orthopnea: Secondary | ICD-10-CM

## 2020-05-12 DIAGNOSIS — G4719 Other hypersomnia: Secondary | ICD-10-CM | POA: Diagnosis not present

## 2020-05-12 NOTE — Progress Notes (Signed)
SLEEP MEDICINE CLINIC    Provider:  Larey Seat, MD  Primary Care Physician:  [name], [place] [address] [place] [address]     Referring Provider: [name], [address],  [place] [address]          Chief Complaint according to patient   Patient presents with:    . New Patient (Initial Visit)     pt with daughter, rm 36. presents today as referral from Dr Jaynee Eagles. she saw her for stroke. she states that she has never had a SS but admits to waking up frequently during the night to void, falling asleep often during the day while sitting upright in recliner to sleep.       HISTORY OF PRESENT ILLNESS:  Charlotte [name] is a 84 year- old Caucasian female  Stroke patient and seen here upon referral on 05/12/2020 from Dr Jaynee Eagles.   Chief concern according to patient :    Charlotte [name] reports excessive daytime sleepiness being more sleepy than she usually used to be.  She has very frequent nocturia and this is not a new issue but may have been present for 2-3 or even 4 years.  Multiple bathroom breaks means up to 7 nocturia's.  She also has reached a stage of physical deconditioning that makes it harder for her to not get short of breath while she walks or performs rather simple tasks.  She is using a walker for ambulation, but she reports that she sleeps because of orthopnea better than her back is elevated so a recliner is more conducive to sleep than her bed. Family members have not heard her snore and there is a good chance that this patient has central apnea due to her congestive heart failure history associated with atrial fibrillation.  She also may be much more unrefreshed because of anemia and chronic blood loss in the setting of chronic anticoagulation.  She had suffered a GI bleed in the past.  There was no alternative to resuming at least some level of anticoagulation safely.  Remains on Xarelto and  No longer on  ASA.    I have the pleasure of seeing Charlotte [name] today, a right -handed White or Caucasian female with a possible sleep disorder.  She has a  has a past medical history of Aortic insufficiency, Atrial fibrillation ([place]), CAD (coronary artery disease), CATARACTS, Chronic systolic heart failure ([place]), Complication of anesthesia (2009), GERD (gastroesophageal reflux disease), HYPERLIPIDEMIA, MILD, HYPERTENSION, BENIGN SYSTEMIC, Irritable bowel syndrome, LBBB (left bundle branch block), MEDIAL EPICONDYLITIS, NSVT (nonsustained ventricular tachycardia) (HCC), OSTEOARTHRITIS, OSTEOPENIA, PONV (postoperative nausea and vomiting), Pre-diabetes, RHINITIS, ALLERGIC, Stroke ([employer]), SYNCOPE, and VITAMIN D DEFICIENCY.   Social history:  Patient is retired from [employer], and lives in a household with alone.  Family status is widowed  with adult children, her daughter is her neighbour.   She likes puzzles and sudoko. No longer reads.  Pets are not present. Tobacco use "not in 50 years ".  ETOH use ;none ,  Caffeine intake in form of Coffee( some) Soda( /) Tea ( some) or energy drinks.    Sleep habits are as follows: The patient's dinner time is between 6 PM.  The patient goes to bed at 10.30 PM and continues to sleep for one hour at a time , fragmented  hourly, wakes for nocturia- bathroom breaks. The preferred sleep position is  in bed in a lateral or supine position, but she changes soon to the recliner-  About 4-5 AM.  9 AM is the usual rise time. The patient wakes up spontaneously.  She reports not feeling refreshed or restored in AM, with symptoms such as dry mouth and residual fatigue.  Naps are taken frequently,  She will doze off - lasting from 20-60 minutes and are more refreshing than nocturnal sleep.    Review of Systems: Out of a complete 14 system review, the patient complains of only the following symptoms, and all other reviewed systems are negative.:  Fatigue, sleepiness ,  snoring, fragmented sleep, nocturia times 7. Coughing at night, orthopnea.   Insomnia decondition-    How likely are you to doze in the following situations: 0 = not likely, 1 = slight chance, 2 = moderate chance, 3 = high chance   Sitting and Reading? Watching Television? Sitting inactive in a public place (theater or meeting)? As a passenger in a car for an hour without a break? Lying down in the afternoon when circumstances permit? Sitting and talking to someone? Sitting quietly after lunch without alcohol? In a car, while stopped for a few minutes in traffic?   Total = -15/ 24 points   FSS endorsed at 58/ 63 points.   GDS 6/ 15 , clinically relevant depression.   Social History   Socioeconomic History  . Marital status: Divorced    Spouse name: Not on file  . Number of children: 1  . Years of education: Not on file  . Highest education level: High school graduate  Occupational History  . Occupation: Retired    Fish farm manager: RETIRED  Tobacco Use  . Smoking status: Former Smoker    Quit date: 10/08/1970    Years since quitting: 49.6  . Smokeless tobacco: Never Used  Vaping Use  . Vaping Use: Never used  Substance and Sexual Activity  . Alcohol use: No  . Drug use: No  . Sexual activity: Not on file  Other Topics Concern  . Not on file  Social History Narrative   Lives by herself at home   Right handed   Caffeine: 1 cup/day at the most. Recently switched to decaf coffee.   Social Determinants of Health   Financial Resource Strain:   . Difficulty of Paying Living Expenses:   Food Insecurity:   . Worried About Charity fundraiser in the Last Year:   . Arboriculturist in the Last Year:   Transportation Needs:   . Film/video editor (Medical):   Marland Kitchen Lack of Transportation (Non-Medical):   Physical Activity:   . Days of Exercise per Week:   . Minutes of Exercise per Session:   Stress:   . Feeling of Stress :   Social Connections:   . Frequency of Communication  with Friends and Family:   . Frequency of Social Gatherings with Friends and Family:   . Attends Religious Services:   . Active Member of Clubs or Organizations:   . Attends Archivist Meetings:   Marland Kitchen Marital Status:     Family History  Problem Relation Age of Onset  . Arrhythmia Mother        afib  . Alzheimer's disease Mother   . Stroke Mother   . Colon cancer Father   . Heart attack Father 75  . Diabetes Sister   . Arrhythmia Sister        afib  . Stroke Maternal Grandmother  Past Medical History:  Diagnosis Date  . Aortic insufficiency   . Atrial fibrillation ([place])    a. s/p DCCV 03/2013  . CAD (coronary artery disease)    LHC 06/2010: EF 55%, mild plaque in the LAD 20-30%, otherwise normal coronary arteries  . CATARACTS   . Chronic systolic heart failure ([place])    in setting of AFib;  Echocardiogram 02/2013: Moderate LVH, EF 25-30%, anteroseptal and apical HK, moderate AI, MAC, moderate MR, moderate LAE, mild RAE, PASP 32 => after DCCV f/u Echo 8/14: Moderate LVH, EF 60-65%, normal wall motion, grade 1 diastolic dysfunction, moderate AI, moderate LAE  . Complication of anesthesia 2009   nausea and vomitting  . GERD (gastroesophageal reflux disease)   . HYPERLIPIDEMIA, MILD   . HYPERTENSION, BENIGN SYSTEMIC   . Irritable bowel syndrome   . LBBB (left bundle branch block)   . MEDIAL EPICONDYLITIS   . NSVT (nonsustained ventricular tachycardia) ([place])    during Dob Echo 2011 => normal cath  . OSTEOARTHRITIS   . OSTEOPENIA   . PONV (postoperative nausea and vomiting)   . Pre-diabetes   . RHINITIS, ALLERGIC   . Stroke ([place])   . SYNCOPE   . VITAMIN D DEFICIENCY     Past Surgical History:  Procedure Laterality Date  . BIOPSY  03/01/2020   Procedure: BIOPSY;  Surgeon: Otis Brace, MD;  Location: [facility];  Service: Gastroenterology;;  . CARDIOVERSION N/A 04/01/2013   Procedure: CARDIOVERSION;  Surgeon: Josue Hector, MD;  Location: [facility] ENDOSCOPY;   Service: Cardiovascular;  Laterality: N/A;  . CARDIOVERSION N/A 07/16/2017   Procedure: CARDIOVERSION;  Surgeon: Pixie Casino, MD;  Location: [facility] ENDOSCOPY;  Service: Cardiovascular;  Laterality: N/A;  . CARDIOVERSION N/A 07/07/2018   Procedure: CARDIOVERSION;  Surgeon: Pixie Casino, MD;  Location: [facility];  Service: Cardiovascular;  Laterality: N/A;  . CATARACT EXTRACTION, BILATERAL  2012   Dr. Herbert Deaner  . COLONOSCOPY WITH PROPOFOL N/A 05/23/2017   Procedure: COLONOSCOPY WITH PROPOFOL;  Surgeon: Wonda Horner, MD;  Location: [facility] ENDOSCOPY;  Service: Endoscopy;  Laterality: N/A;  . ESOPHAGOGASTRODUODENOSCOPY N/A 03/01/2020   Procedure: ESOPHAGOGASTRODUODENOSCOPY (EGD);  Surgeon: Otis Brace, MD;  Location: [facility] ENDOSCOPY;  Service: Gastroenterology;  Laterality: N/A;  . ESOPHAGOGASTRODUODENOSCOPY (EGD) WITH PROPOFOL N/A 05/23/2017   Procedure: ESOPHAGOGASTRODUODENOSCOPY (EGD) WITH PROPOFOL;  Surgeon: Wonda Horner, MD;  Location: [facility] ENDOSCOPY;  Service: Endoscopy;  Laterality: N/A;  . IR KYPHO LUMBAR INC FX REDUCE BONE BX UNI/BIL CANNULATION INC/IMAGING  06/06/2017  . IR RADIOLOGIST EVAL & MGMT  07/01/2017  . RADICAL HYSTERECTOMY    . REPLACEMENT TOTAL KNEE     11/03/07 right....... 03/29/08 left     Current Outpatient Medications on File Prior to Visit  Medication Sig Dispense Refill  . acetaminophen (TYLENOL) 500 MG tablet Take 1,000 mg by mouth 3 (three) times daily.    Marland Kitchen atorvastatin (LIPITOR) 40 MG tablet Take 1 tablet (40 mg total) by mouth daily. 90 tablet 6  . carvedilol (COREG) 12.5 MG tablet Take 1 tablet (12.5 mg total) by mouth 2 (two) times daily with a meal. 60 tablet 4  . furosemide (LASIX) 20 MG tablet TAKE 1 TABLET TWICE A DAY (Patient taking differently: Take 20 mg by mouth 2 (two) times daily. ) 60 tablet 4  . losartan (COZAAR) 25 MG tablet TAKE 1 TABLET BY MOUTH EVERY DAY (Patient taking differently: Take 25 mg by mouth daily. ) 90 tablet 1  . polyvinyl  alcohol-povidone (REFRESH) 1.4-0.6 %  ophthalmic solution Place 1-2 drops into both eyes daily as needed (dry eyes).     . potassium chloride (KLOR-CON) 10 MEQ tablet TAKE 1 TABLET (10 MEQ TOTAL) BY MOUTH 2 (TWO) TIMES DAILY. (Patient taking differently: Take 10 mEq by mouth 2 (two) times daily. ) 180 tablet 1  . pregabalin (LYRICA) 25 MG capsule 2 tabs PO QHS x 2 weeks then decrease to 1 tab PO QHS x 2 weeks and then stop. 45 capsule 0  . rivaroxaban (XARELTO) 20 MG TABS tablet Take 1 tablet (20 mg total) by mouth daily with supper. 90 tablet 0  . omeprazole (PRILOSEC) 40 MG capsule Take 1 capsule (40 mg total) by mouth in the morning and at bedtime. 180 capsule 0   No current facility-administered medications on file prior to visit.    Allergies  Allergen Reactions  . Metformin And Related Other (See Comments)    Renal failure  . Amlodipine Other (See Comments)    HEADACHE    Physical exam:  Today's Vitals   05/12/20 1456  BP: 138/84  Pulse: (!) 103  Weight: 297 lb (134.7 kg)  Height: _0  (1.6 m)   Body mass index is 52.61 kg/m.   Wt Readings from Last 3 Encounters:  05/12/20 297 lb (134.7 kg)  04/21/20 294 lb (133.4 kg)  04/12/20 293 lb (132.9 kg)     Ht Readings from Last 3 Encounters:  05/12/20 _1  (1.6 m)  04/21/20 _2  (1.6 m)  04/12/20 _3  (1.6 m)      General: The patient is awake, alert and appears not in acute distress. The patient is clean and groomed. Head: Normocephalic, atraumatic. Neck is supple. Mallampati 3 plus ,  neck circumference: 19(!) inches . Nasal airflow is patent.   Retrognathia is seen.   Cardiovascular: irreegular rate and cardiac rhythm by pulse. Respiratory: Lungs are wheezing to auscultation.  SOB, tachypnoea.  Skin:  With ankle edema, and delayed capillary refill.  Trunk: The patient's posture is erect.   Neurologic exam : The patient is awake and alert, oriented to place and time.   Memory subjective described as impaired.    Attention span & concentration ability appears normal.  Speech is fluent,  withdysarthria, dysphonia but not  aphasia.  SOB interrupts her flow.  Mood and affect are appropriate.   Cranial nerves: no loss of smell or taste reported  Pupils are equal and briskly reactive to light. Funduscopic exam deferred, status post cataract surgery.  Extraocular movements in vertical and horizontal planes were intact and without nystagmus.  No Diplopia. Visual fields by finger perimetry are intact. Hearing was intact to soft voice and finger rubbing.    Facial sensation intact to fine touch.  Facial motor strength is symmetric and tongue and uvula move midline.  Neck ROM : rotation, tilt and flexion extension were normal for age and shoulder shrug was symmetrical.    Motor exam:  Symmetric bulk, tone and ROM.   Normal tone without cog- wheeling, symmetric grip strength .   Sensory:  Fine touch, pinprick and vibration were all reduced, partially due to high grade edema.  Proprioception tested in the upper extremities was normal.   Coordination: Rapid alternating movements in the fingers/hands were of normal speed.  The Finger-to-nose maneuver was showing evidence of ataxia, dysmetria  But not tremor.   Gait and station: deferred.  Deep tendon reflexes: in the  upper and lower extremities are symmetric and attanuated, only traces in the  knees.  Babinski response was absent.       After spending a total time of 45 minutes face to face and additional time for physical and neurologic examination, review of laboratory studies,  personal review of imaging studies, reports and results of other testing and review of referral information / records as far as provided in visit, I have established the following assessments:  1) Charlotte [name] has a very large neck and upper airway restriction, wheezing in both lunge\s, and an irregular heart beat. She is deconditioned, with ankle edema and orthopnea and SOB- and  super obese.    She is at high risk for hypoventilation and central apnea , cheyne stokes type.    My Plan is to proceed with:  1)  The patient is very uncomfortable sleeping outside the home environment, so I suggest a HST/ her daughter will assist.  2)  I like to follow up on the test results within 8 weeks (and if central apnea is present will order an in lab titration at that time).   I would like to thank [name], MD and [name], [address] [place],  [place] [address] for allowing me to meet with and to take care of this pleasant patient.   In short, Charlotte [name] is presenting with deconditioning, morbid obesity, CHF and edema. She does not snore.  I plan to follow up either personally or through our NP within 2 month.   CC: I will share my notes with PCP and Dr Jaynee Eagles.   Electronically signed by: Larey Seat, MD 05/12/2020 3:10 PM  [facility] [place] certified by The [employer] of Sleep Medicine and Diplomate of the [employer] of Sleep Medicine. Board certified In Neurology through the [place], Fellow of the [employer] of Neurology. Medical Director of [employer].

## 2020-05-12 NOTE — Patient Instructions (Signed)
Obesity Hypoventilation Syndrome  Obesity hypoventilation syndrome (OHS) means that you are not breathing well enough to get air in and out of your lungs efficiently (ventilation). This causes a low oxygen level and a high carbon dioxide level in your blood (hypoventilation). Having too much total body fat (obesity) is a significant risk factor for developing OHS. OHS makes it harder for your heart to pump oxygen-rich blood to your body. It can cause sleep disturbances and make you feel sleepy during the day. Over time, OHS can increase your risk for:  Heart disease.  High blood pressure (hypertension).  Reduced ability to absorb sugar from the bloodstream (insulin resistance).  Heart failure. Over time, OHS weakens your heart and can lead to heart failure. What are the causes? The exact cause of OHS is not known. Possible causes include:  Pressure on the lungs from excess body weight.  Obesity-related changes in how much air the lungs can hold (lung capacity) and how much they can expand (lung compliance).  Failure of the brain to regulate oxygen and carbon dioxide levels properly.  Chemicals (hormones) produced by excess fat cells interfering with breathing regulation.  A breathing condition in which breathing pauses or becomes shallow during sleep (sleep apnea). This condition can eventually cause the body to ventilate poorly and to hold onto carbon dioxide during the day. What increases the risk? You may have a greater risk for OHS if you:  Have a BMI of 30 or higher. BMI is an estimate of body fat that is calculated from height and weight. For adults, a BMI of 30 or higher is considered obese.  Are 40?82 years old.  Carry most of your excess weight around your waist.  Experience moderate symptoms of sleep apnea. What are the signs or symptoms? The most common symptoms of OHS are:  Daytime sleepiness.  Lack of energy.  Shortness of breath.  Morning headaches.  Sleep  apnea.  Trouble concentrating.  Irritability, mood swings, or depression.  Swollen veins in the neck.  Swelling of the legs. How is this diagnosed? Your health care provider may suspect OHS if you are obese and have poor breathing during the day and at night. Your health care provider will also do a physical exam. You may have tests to:  Measure your BMI.  Measure your blood oxygen level with a sensor placed on your finger (pulse oximetry).  Measure blood oxygen and carbon dioxide in a blood sample.  Measure the amount of red blood cells in a blood sample. OHS causes the number of red blood cells you have to increase (polycythemia).  Check your breathing ability (pulmonary function testing).  Check your breathing ability, breathing patterns, and oxygen level while you sleep (sleep study). You may also have a chest X-ray to rule out other breathing problems. You may have an electrocardiogram (ECG) and or echocardiogram to check for signs of heart failure. How is this treated? Weight loss is the most important part of treatment for OHS, and it may be the only treatment that you need. Other treatments may include:  Using a device to open your airway while you sleep, such as a continuous positive airway pressure (CPAP) machine that delivers oxygen to your airway through a mask.  Surgery (gastric bypass surgery) to lower your BMI. This may be needed if: ? You are very obese. ? Other treatments have not worked for you. ? Your OHS is very severe and is causing organ damage, such as heart failure. Follow these   instructions at home:  Medicines  Take over-the-counter and prescription medicines only as told by your health care provider.  Ask your health care provider what medicines are safe for you. You may be told to avoid medicines that can impair breathing and make OHS worse, such as sedatives and narcotics. Sleeping habits  If you are prescribed a CPAP machine, make sure you  understand and use the machine as directed.  Try to get 8 hours of sleep every night.  Go to bed at the same time every night, and get up at the same time every day. General instructions  Work with your health care provider to make a diet and exercise plan that helps you reach and maintain a healthy weight.  Eat a healthy diet.  Avoid smoking.  Exercise regularly as told by your health care provider.  During the evening, do not drink caffeine and do not eat heavy meals.  Keep all follow-up visits as told by your health care provider. This is important. Contact a health care provider if:  You experience new or worsening shortness of breath.  You have chest pain.  You have an irregular heartbeat (palpitations).  You have dizziness.  You faint.  You develop a cough.  You have a fever.  You have chest pain when you breathe (pleurisy). This information is not intended to replace advice given to you by your health care provider. Make sure you discuss any questions you have with your health care provider. Document Revised: 01/16/2019 Document Reviewed: 03/05/2016 Elsevier Patient Education  2020 [employer]  

## 2020-05-14 ENCOUNTER — Other Ambulatory Visit: Payer: Self-pay | Admitting: Cardiovascular Disease

## 2020-05-30 DIAGNOSIS — Z008 Encounter for other general examination: Secondary | ICD-10-CM | POA: Diagnosis not present

## 2020-05-30 DIAGNOSIS — I509 Heart failure, unspecified: Secondary | ICD-10-CM | POA: Diagnosis not present

## 2020-05-30 DIAGNOSIS — E261 Secondary hyperaldosteronism: Secondary | ICD-10-CM | POA: Diagnosis not present

## 2020-05-30 DIAGNOSIS — I110 Hypertensive heart disease with heart failure: Secondary | ICD-10-CM | POA: Diagnosis not present

## 2020-05-30 DIAGNOSIS — J309 Allergic rhinitis, unspecified: Secondary | ICD-10-CM | POA: Diagnosis not present

## 2020-05-30 DIAGNOSIS — Z6841 Body Mass Index (BMI) 40.0 and over, adult: Secondary | ICD-10-CM | POA: Diagnosis not present

## 2020-05-30 DIAGNOSIS — D6869 Other thrombophilia: Secondary | ICD-10-CM | POA: Diagnosis not present

## 2020-05-30 DIAGNOSIS — R69 Illness, unspecified: Secondary | ICD-10-CM | POA: Diagnosis not present

## 2020-05-30 DIAGNOSIS — I4891 Unspecified atrial fibrillation: Secondary | ICD-10-CM | POA: Diagnosis not present

## 2020-05-30 DIAGNOSIS — E785 Hyperlipidemia, unspecified: Secondary | ICD-10-CM | POA: Diagnosis not present

## 2020-06-01 NOTE — Progress Notes (Signed)
HPI: Follow-up atrial fibrillation. Previously followed by Dr. Johnsie Cancel. Catheterization September 2011 showed mild plaque in the LAD at 20 to 30%. Previously had a cardiomyopathy which did improve. Echocardiogram August 2019 showed ejection fraction 45 to 50%, mild to moderate aortic insufficiency, mild mitral regurgitation, mild left atrial enlargement, mild tricuspid regurgitation. Nuclear study August 2019 showed ejection fraction 48% with fixed anteroseptal defect felt secondary to left bundle branch block. No ischemia. Patient is now treated with amiodarone.  Echocardiogram repeated December 2020 and showed normal LV function, mild left ventricular hypertrophy, grade 1 diastolic dysfunction, mild aortic insufficiency.  Patient admitted May 2021 with GI bleed and syncope.  Initial hemoglobin 6.9.  EGD showed multiple clean-based superficial gastric ulcers.  She was transfused 2 units packed red blood cells and treated with IV Protonix.  Xarelto was held.  Also had transient renal insufficiency with creatinine of 1.84 which improved prior to discharge.  Since last seen  patient does have dyspnea unchanged.  No chest pain, palpitations, syncope or bleeding.  She does have chronic mild pedal edema.  Current Outpatient Medications  Medication Sig Dispense Refill  . acetaminophen (TYLENOL) 500 MG tablet Take 1,000 mg by mouth 3 (three) times daily.    Marland Kitchen atorvastatin (LIPITOR) 40 MG tablet Take 1 tablet (40 mg total) by mouth daily. 90 tablet 6  . carvedilol (COREG) 12.5 MG tablet Take 1 tablet (12.5 mg total) by mouth 2 (two) times daily with a meal. 60 tablet 4  . furosemide (LASIX) 20 MG tablet TAKE 1 TABLET TWICE A DAY (Patient taking differently: Take 20 mg by mouth 2 (two) times daily. ) 60 tablet 4  . losartan (COZAAR) 25 MG tablet TAKE 1 TABLET BY MOUTH EVERY DAY (Patient taking differently: Take 25 mg by mouth daily. ) 90 tablet 1  . omeprazole (PRILOSEC) 40 MG capsule Take 1  capsule (40 mg total) by mouth in the morning and at bedtime. 180 capsule 0  . polyvinyl alcohol-povidone (REFRESH) 1.4-0.6 % ophthalmic solution Place 1-2 drops into both eyes daily as needed (dry eyes).     . potassium chloride (KLOR-CON) 10 MEQ tablet TAKE 1 TABLET (10 MEQ TOTAL) BY MOUTH 2 (TWO) TIMES DAILY. 180 tablet 3  . pregabalin (LYRICA) 25 MG capsule 2 tabs PO QHS x 2 weeks then decrease to 1 tab PO QHS x 2 weeks and then stop. 45 capsule 0  . rivaroxaban (XARELTO) 20 MG TABS tablet Take 1 tablet (20 mg total) by mouth daily with supper. 90 tablet 0   No current facility-administered medications for this visit.     Past Medical History:  Diagnosis Date  . Aortic insufficiency   . Atrial fibrillation ([place])    a. s/p DCCV 03/2013  . CAD (coronary artery disease)    LHC 06/2010: EF 55%, mild plaque in the LAD 20-30%, otherwise normal coronary arteries  . CATARACTS   . Chronic systolic heart failure ([place])    in setting of AFib;  Echocardiogram 02/2013: Moderate LVH, EF 25-30%, anteroseptal and apical HK, moderate AI, MAC, moderate MR, moderate LAE, mild RAE, PASP 32 => after DCCV f/u Echo 8/14: Moderate LVH, EF 60-65%, normal wall motion, grade 1 diastolic dysfunction, moderate AI, moderate LAE  . Complication of anesthesia 2009   nausea and vomitting  . GERD (gastroesophageal reflux disease)   . HYPERLIPIDEMIA, MILD   . HYPERTENSION, BENIGN SYSTEMIC   . Irritable bowel syndrome   . LBBB (left bundle branch block)   .  MEDIAL EPICONDYLITIS   . NSVT (nonsustained ventricular tachycardia) ([place])    during Dob Echo 2011 => normal cath  . OSTEOARTHRITIS   . OSTEOPENIA   . PONV (postoperative nausea and vomiting)   . Pre-diabetes   . RHINITIS, ALLERGIC   . Stroke ([place])   . SYNCOPE   . VITAMIN D DEFICIENCY     Past Surgical History:  Procedure Laterality Date  . BIOPSY  03/01/2020   Procedure: BIOPSY;  Surgeon: Otis Brace, MD;  Location: [place];  Service:  Gastroenterology;;  . CARDIOVERSION N/A 04/01/2013   Procedure: CARDIOVERSION;  Surgeon: Josue Hector, MD;  Location: [facility] ENDOSCOPY;  Service: Cardiovascular;  Laterality: N/A;  . CARDIOVERSION N/A 07/16/2017   Procedure: CARDIOVERSION;  Surgeon: Pixie Casino, MD;  Location: [facility] ENDOSCOPY;  Service: Cardiovascular;  Laterality: N/A;  . CARDIOVERSION N/A 07/07/2018   Procedure: CARDIOVERSION;  Surgeon: Pixie Casino, MD;  Location: [place];  Service: Cardiovascular;  Laterality: N/A;  . CATARACT EXTRACTION, BILATERAL  2012   Dr. Herbert Deaner  . COLONOSCOPY WITH PROPOFOL N/A 05/23/2017   Procedure: COLONOSCOPY WITH PROPOFOL;  Surgeon: Wonda Horner, MD;  Location: [facility] ENDOSCOPY;  Service: Endoscopy;  Laterality: N/A;  . ESOPHAGOGASTRODUODENOSCOPY N/A 03/01/2020   Procedure: ESOPHAGOGASTRODUODENOSCOPY (EGD);  Surgeon: Otis Brace, MD;  Location: [facility] ENDOSCOPY;  Service: Gastroenterology;  Laterality: N/A;  . ESOPHAGOGASTRODUODENOSCOPY (EGD) WITH PROPOFOL N/A 05/23/2017   Procedure: ESOPHAGOGASTRODUODENOSCOPY (EGD) WITH PROPOFOL;  Surgeon: Wonda Horner, MD;  Location: [facility] ENDOSCOPY;  Service: Endoscopy;  Laterality: N/A;  . IR KYPHO LUMBAR INC FX REDUCE BONE BX UNI/BIL CANNULATION INC/IMAGING  06/06/2017  . IR RADIOLOGIST EVAL & MGMT  07/01/2017  . RADICAL HYSTERECTOMY    . REPLACEMENT TOTAL KNEE     11/03/07 right....... 03/29/08 left    Social History   Socioeconomic History  . Marital status: Divorced    Spouse name: Not on file  . Number of children: 1  . Years of education: Not on file  . Highest education level: High school graduate  Occupational History  . Occupation: Retired    Fish farm manager: RETIRED  Tobacco Use  . Smoking status: Former Smoker    Quit date: 10/08/1970    Years since quitting: 49.7  . Smokeless tobacco: Never Used  Vaping Use  . Vaping Use: Never used  Substance and Sexual Activity  . Alcohol use: No  . Drug use: No  . Sexual activity: Not on file  Other  Topics Concern  . Not on file  Social History Narrative   Lives by herself at home   Right handed   Caffeine: 1 cup/day at the most. Recently switched to decaf coffee.   Social Determinants of Health   Financial Resource Strain:   . Difficulty of Paying Living Expenses: Not on file  Food Insecurity:   . Worried About Charity fundraiser in the Last Year: Not on file  . Ran Out of Food in the Last Year: Not on file  Transportation Needs:   . Lack of Transportation (Medical): Not on file  . Lack of Transportation (Non-Medical): Not on file  Physical Activity:   . Days of Exercise per Week: Not on file  . Minutes of Exercise per Session: Not on file  Stress:   . Feeling of Stress : Not on file  Social Connections:   . Frequency of Communication with Friends and Family: Not on file  . Frequency of Social Gatherings with Friends and Family: Not on file  .  Attends Religious Services: Not on file  . Active Member of Clubs or Organizations: Not on file  . Attends Archivist Meetings: Not on file  . Marital Status: Not on file  Intimate Partner Violence:   . Fear of Current or Ex-Partner: Not on file  . Emotionally Abused: Not on file  . Physically Abused: Not on file  . Sexually Abused: Not on file    Family History  Problem Relation Age of Onset  . Arrhythmia Mother        afib  . Alzheimer's disease Mother   . Stroke Mother   . Colon cancer Father   . Heart attack Father 60  . Diabetes Sister   . Arrhythmia Sister        afib  . Stroke Maternal Grandmother     ROS: Knee arthralgias but no fevers or chills, productive cough, hemoptysis, dysphasia, odynophagia, melena, hematochezia, dysuria, hematuria, rash, seizure activity, orthopnea, PND, claudication. Remaining systems are negative.  Physical Exam: Well-developed obese in no acute distress.  Skin is warm and dry.  HEENT is normal.  Neck is supple.  Chest is clear to auscultation with normal expansion.    Cardiovascular exam is irregular Abdominal exam nontender or distended. No masses palpated. Extremities show trace edema. neuro grossly intact  ECG-lesion at a rate of 89, left bundle branch block.  Personally reviewed  A/P  1 permanent atrial fibrillation-as outlined previously amiodarone was discontinued due to potential side effects.  She remains in atrial fibrillation.  She remains asymptomatic and I therefore think rate control and anticoagulation is indicated.  Continue Xarelto and carvedilol.  She will require lifelong anticoagulation given history of CVA.  2 hypertension-blood pressure controlled.  Continue present medications and follow.  3 hyperlipidemia-continue statin.  4 history of mild coronary disease-continue statin.  No aspirin given need for Xarelto.  5 history of aortic insufficiency-mild on most recent echocardiogram.  6 lower extremity edema-continue diuretic.  7 prior CVA-occurred while on Xarelto and neurology previously added aspirin.  However this was discontinued due to GI bleed.  We will continue Xarelto alone.  Kirk Ruths, MD

## 2020-06-08 ENCOUNTER — Encounter: Payer: Self-pay | Admitting: Cardiology

## 2020-06-08 ENCOUNTER — Other Ambulatory Visit: Payer: Self-pay

## 2020-06-08 ENCOUNTER — Ambulatory Visit: Payer: Medicare HMO | Admitting: Cardiology

## 2020-06-08 ENCOUNTER — Ambulatory Visit (INDEPENDENT_AMBULATORY_CARE_PROVIDER_SITE_OTHER): Payer: Medicare HMO | Admitting: Neurology

## 2020-06-08 VITALS — BP 136/82 | HR 89 | Ht 63.0 in | Wt 288.8 lb

## 2020-06-08 DIAGNOSIS — G4719 Other hypersomnia: Secondary | ICD-10-CM

## 2020-06-08 DIAGNOSIS — I480 Paroxysmal atrial fibrillation: Secondary | ICD-10-CM

## 2020-06-08 DIAGNOSIS — G4710 Hypersomnia, unspecified: Secondary | ICD-10-CM

## 2020-06-08 DIAGNOSIS — I10 Essential (primary) hypertension: Secondary | ICD-10-CM

## 2020-06-08 DIAGNOSIS — I2510 Atherosclerotic heart disease of native coronary artery without angina pectoris: Secondary | ICD-10-CM

## 2020-06-08 DIAGNOSIS — E785 Hyperlipidemia, unspecified: Secondary | ICD-10-CM | POA: Diagnosis not present

## 2020-06-08 DIAGNOSIS — I5042 Chronic combined systolic (congestive) and diastolic (congestive) heart failure: Secondary | ICD-10-CM

## 2020-06-08 DIAGNOSIS — I6381 Other cerebral infarction due to occlusion or stenosis of small artery: Secondary | ICD-10-CM

## 2020-06-08 DIAGNOSIS — G4734 Idiopathic sleep related nonobstructive alveolar hypoventilation: Secondary | ICD-10-CM

## 2020-06-08 DIAGNOSIS — R0601 Orthopnea: Secondary | ICD-10-CM

## 2020-06-08 DIAGNOSIS — I4821 Permanent atrial fibrillation: Secondary | ICD-10-CM

## 2020-06-08 DIAGNOSIS — G4730 Sleep apnea, unspecified: Secondary | ICD-10-CM

## 2020-06-08 DIAGNOSIS — G4731 Primary central sleep apnea: Secondary | ICD-10-CM

## 2020-06-08 NOTE — Patient Instructions (Signed)
Medication Instructions:  NO CHANGE *If you need a refill on your cardiac medications before your next appointment, please call your pharmacy*   Follow-Up: At [facility], you and your health needs are our priority.  As part of our continuing mission to provide you with exceptional heart care, we have created designated Provider Care Teams.  These Care Teams include your primary Cardiologist (physician) and Advanced Practice Providers (APPs -  Physician Assistants and Nurse Practitioners) who all work together to provide you with the care you need, when you need it.  We recommend signing up for the patient portal called "MyChart".  Sign up information is provided on this After Visit Summary.  MyChart is used to connect with patients for Virtual Visits (Telemedicine).  Patients are able to view lab/test results, encounter notes, upcoming appointments, etc.  Non-urgent messages can be sent to your provider as well.   To learn more about what you can do with MyChart, go to [email or web address].    Your next appointment:   6 month(s)  The format for your next appointment:   In Person  Provider:   Brian Crenshaw, MD    

## 2020-06-09 ENCOUNTER — Other Ambulatory Visit: Payer: Self-pay | Admitting: Cardiology

## 2020-06-09 DIAGNOSIS — I4819 Other persistent atrial fibrillation: Secondary | ICD-10-CM

## 2020-06-13 ENCOUNTER — Other Ambulatory Visit: Payer: Self-pay | Admitting: Cardiovascular Disease

## 2020-06-13 ENCOUNTER — Other Ambulatory Visit: Payer: Self-pay | Admitting: Cardiology

## 2020-06-13 DIAGNOSIS — I480 Paroxysmal atrial fibrillation: Secondary | ICD-10-CM

## 2020-06-16 ENCOUNTER — Ambulatory Visit (INDEPENDENT_AMBULATORY_CARE_PROVIDER_SITE_OTHER): Payer: Medicare HMO | Admitting: Family Medicine

## 2020-06-16 ENCOUNTER — Encounter: Payer: Self-pay | Admitting: Family Medicine

## 2020-06-16 ENCOUNTER — Other Ambulatory Visit: Payer: Self-pay

## 2020-06-16 VITALS — BP 130/82 | HR 78 | Ht 63.0 in | Wt 295.0 lb

## 2020-06-16 DIAGNOSIS — E1169 Type 2 diabetes mellitus with other specified complication: Secondary | ICD-10-CM | POA: Diagnosis not present

## 2020-06-16 DIAGNOSIS — M48061 Spinal stenosis, lumbar region without neurogenic claudication: Secondary | ICD-10-CM

## 2020-06-16 DIAGNOSIS — R634 Abnormal weight loss: Secondary | ICD-10-CM | POA: Diagnosis not present

## 2020-06-16 DIAGNOSIS — Z23 Encounter for immunization: Secondary | ICD-10-CM

## 2020-06-16 DIAGNOSIS — E119 Type 2 diabetes mellitus without complications: Secondary | ICD-10-CM

## 2020-06-16 DIAGNOSIS — D500 Iron deficiency anemia secondary to blood loss (chronic): Secondary | ICD-10-CM | POA: Diagnosis not present

## 2020-06-16 DIAGNOSIS — K254 Chronic or unspecified gastric ulcer with hemorrhage: Secondary | ICD-10-CM | POA: Diagnosis not present

## 2020-06-16 DIAGNOSIS — E669 Obesity, unspecified: Secondary | ICD-10-CM

## 2020-06-16 DIAGNOSIS — K279 Peptic ulcer, site unspecified, unspecified as acute or chronic, without hemorrhage or perforation: Secondary | ICD-10-CM

## 2020-06-16 DIAGNOSIS — I10 Essential (primary) hypertension: Secondary | ICD-10-CM

## 2020-06-16 MED ORDER — OMEPRAZOLE 40 MG PO CPDR: 40.0000 mg | DELAYED_RELEASE_CAPSULE | Freq: Two times a day (BID) | ORAL | 0 refills | Status: DC

## 2020-06-16 NOTE — Assessment & Plan Note (Signed)
Been on daily iron supplementation for 3 months.  Time to recheck levels.  Continue with PPI for prophylaxis since she is on Xarelto.

## 2020-06-16 NOTE — Assessment & Plan Note (Signed)
Denies any hypoglycemic events.  Follow-up in 3 months.

## 2020-06-16 NOTE — Assessment & Plan Note (Signed)
He is actually down 2 more pounds which is fantastic initially I was worried about how quickly she was losing weight but it seems to have leveled off.  That she is down about 2 more pounds since I last saw her a month ago.  Continue work on [employer] and staying as active as she is able to be.

## 2020-06-16 NOTE — Progress Notes (Signed)
Established Patient Office Visit  Subjective:  Patient ID: Charlotte [name], female    DOB: Jun 17, 1936  Age: 84 y.o. MRN: [id number]  CC:  Chief Complaint  Patient presents with  . Hypertension  . Weight Check    HPI Charlotte [name] presents for f/U weight loss.  Down 2 more lbs in the last month.    She is now completely off the Lyrica.  She has been on the 25 mg for 2 weeks and actually just ran out.  She has noticed a slight increase in her leg pain but she is just not really sure if it is related to coming off the Lyrica or not.  He originally went down on her dose she did not notice any difference.  She still taking her iron tablet daily status post GI hemorrhage.  She wants to know how long she will need to take it.  Last CBC was June 23 with a hemoglobin of 8.7.  He also reports has been having a hard time getting in with a gastroenterologist.  She said she called and was told that they were not scheduling appointment 6 weeks out that they would call her back which they never did her daughter then called and was told the same thing and again they still have an appointment.  She had her EGD Mar 01, 2020 and again was told to follow-up 6 weeks after her hospitalization.  Past Medical History:  Diagnosis Date  . Aortic insufficiency   . Atrial fibrillation ([place])    a. s/p DCCV 03/2013  . CAD (coronary artery disease)    LHC 06/2010: EF 55%, mild plaque in the LAD 20-30%, otherwise normal coronary arteries  . CATARACTS   . Chronic systolic heart failure ([place])    in setting of AFib;  Echocardiogram 02/2013: Moderate LVH, EF 25-30%, anteroseptal and apical HK, moderate AI, MAC, moderate MR, moderate LAE, mild RAE, PASP 32 => after DCCV f/u Echo 8/14: Moderate LVH, EF 60-65%, normal wall motion, grade 1 diastolic dysfunction, moderate AI, moderate LAE  . Complication of anesthesia 2009   nausea and vomitting  . GERD (gastroesophageal reflux disease)   . HYPERLIPIDEMIA, MILD   . HYPERTENSION,  BENIGN SYSTEMIC   . Irritable bowel syndrome   . LBBB (left bundle branch block)   . MEDIAL EPICONDYLITIS   . NSVT (nonsustained ventricular tachycardia) ([place])    during Dob Echo 2011 => normal cath  . OSTEOARTHRITIS   . OSTEOPENIA   . PONV (postoperative nausea and vomiting)   . Pre-diabetes   . RHINITIS, ALLERGIC   . Stroke ([place])   . SYNCOPE   . VITAMIN D DEFICIENCY     Past Surgical History:  Procedure Laterality Date  . BIOPSY  03/01/2020   Procedure: BIOPSY;  Surgeon: Otis Brace, MD;  Location: [place];  Service: Gastroenterology;;  . CARDIOVERSION N/A 04/01/2013   Procedure: CARDIOVERSION;  Surgeon: Josue Hector, MD;  Location: [facility] ENDOSCOPY;  Service: Cardiovascular;  Laterality: N/A;  . CARDIOVERSION N/A 07/16/2017   Procedure: CARDIOVERSION;  Surgeon: Pixie Casino, MD;  Location: [facility] ENDOSCOPY;  Service: Cardiovascular;  Laterality: N/A;  . CARDIOVERSION N/A 07/07/2018   Procedure: CARDIOVERSION;  Surgeon: Pixie Casino, MD;  Location: [place];  Service: Cardiovascular;  Laterality: N/A;  . CATARACT EXTRACTION, BILATERAL  2012   Dr. Herbert Deaner  . COLONOSCOPY WITH PROPOFOL N/A 05/23/2017   Procedure: COLONOSCOPY WITH PROPOFOL;  Surgeon: Wonda Horner, MD;  Location: [facility] ENDOSCOPY;  Service: Endoscopy;  Laterality: N/A;  . ESOPHAGOGASTRODUODENOSCOPY N/A 03/01/2020   Procedure: ESOPHAGOGASTRODUODENOSCOPY (EGD);  Surgeon: Otis Brace, MD;  Location: [facility] ENDOSCOPY;  Service: Gastroenterology;  Laterality: N/A;  . ESOPHAGOGASTRODUODENOSCOPY (EGD) WITH PROPOFOL N/A 05/23/2017   Procedure: ESOPHAGOGASTRODUODENOSCOPY (EGD) WITH PROPOFOL;  Surgeon: Wonda Horner, MD;  Location: [facility] ENDOSCOPY;  Service: Endoscopy;  Laterality: N/A;  . IR KYPHO LUMBAR INC FX REDUCE BONE BX UNI/BIL CANNULATION INC/IMAGING  06/06/2017  . IR RADIOLOGIST EVAL & MGMT  07/01/2017  . RADICAL HYSTERECTOMY    . REPLACEMENT TOTAL KNEE     11/03/07 right....... 03/29/08 left    Family History   Problem Relation Age of Onset  . Arrhythmia Mother        afib  . Alzheimer's disease Mother   . Stroke Mother   . Colon cancer Father   . Heart attack Father 30  . Diabetes Sister   . Arrhythmia Sister        afib  . Stroke Maternal Grandmother     Social History   Socioeconomic History  . Marital status: Divorced    Spouse name: Not on file  . Number of children: 1  . Years of education: Not on file  . Highest education level: High school graduate  Occupational History  . Occupation: Retired    Fish farm manager: RETIRED  Tobacco Use  . Smoking status: Former Smoker    Quit date: 10/08/1970    Years since quitting: 49.7  . Smokeless tobacco: Never Used  Vaping Use  . Vaping Use: Never used  Substance and Sexual Activity  . Alcohol use: No  . Drug use: No  . Sexual activity: Not on file  Other Topics Concern  . Not on file  Social History Narrative   Lives by herself at home   Right handed   Caffeine: 1 cup/day at the most. Recently switched to decaf coffee.   Social Determinants of Health   Financial Resource Strain:   . Difficulty of Paying Living Expenses: Not on file  Food Insecurity:   . Worried About Charity fundraiser in the Last Year: Not on file  . Ran Out of Food in the Last Year: Not on file  Transportation Needs:   . Lack of Transportation (Medical): Not on file  . Lack of Transportation (Non-Medical): Not on file  Physical Activity:   . Days of Exercise per Week: Not on file  . Minutes of Exercise per Session: Not on file  Stress:   . Feeling of Stress : Not on file  Social Connections:   . Frequency of Communication with Friends and Family: Not on file  . Frequency of Social Gatherings with Friends and Family: Not on file  . Attends Religious Services: Not on file  . Active Member of Clubs or Organizations: Not on file  . Attends Archivist Meetings: Not on file  . Marital Status: Not on file  Intimate Partner Violence:   . Fear of  Current or Ex-Partner: Not on file  . Emotionally Abused: Not on file  . Physically Abused: Not on file  . Sexually Abused: Not on file    Outpatient Medications Prior to Visit  Medication Sig Dispense Refill  . acetaminophen (TYLENOL) 500 MG tablet Take 1,000 mg by mouth 3 (three) times daily.    Marland Kitchen atorvastatin (LIPITOR) 40 MG tablet Take 1 tablet (40 mg total) by mouth daily. 90 tablet 6  . carvedilol (COREG) 12.5 MG tablet TAKE 1 TABLET  BY MOUTH 2 (TWO) TIMES DAILY WITH A MEAL. 60 tablet 1  . furosemide (LASIX) 20 MG tablet TAKE 1 TABLET BY MOUTH TWICE A DAY 60 tablet 4  . losartan (COZAAR) 25 MG tablet TAKE 1 TABLET BY MOUTH EVERY DAY 90 tablet 3  . polyvinyl alcohol-povidone (REFRESH) 1.4-0.6 % ophthalmic solution Place 1-2 drops into both eyes daily as needed (dry eyes).     . potassium chloride (KLOR-CON) 10 MEQ tablet TAKE 1 TABLET (10 MEQ TOTAL) BY MOUTH 2 (TWO) TIMES DAILY. 180 tablet 3  . rivaroxaban (XARELTO) 20 MG TABS tablet Take 1 tablet (20 mg total) by mouth daily with supper. 90 tablet 0  . omeprazole (PRILOSEC) 40 MG capsule Take 1 capsule (40 mg total) by mouth in the morning and at bedtime. 180 capsule 0  . pregabalin (LYRICA) 25 MG capsule 2 tabs PO QHS x 2 weeks then decrease to 1 tab PO QHS x 2 weeks and then stop. 45 capsule 0   No facility-administered medications prior to visit.    Allergies  Allergen Reactions  . Metformin And Related Other (See Comments)    Renal failure  . Amlodipine Other (See Comments)    HEADACHE    ROS Review of Systems    Objective:    Physical Exam  BP 130/82   Pulse 78   Ht _0  (1.6 m)   Wt 295 lb (133.8 kg)   SpO2 98%   BMI 52.26 kg/m  Wt Readings from Last 3 Encounters:  06/16/20 295 lb (133.8 kg)  06/08/20 288 lb 12.8 oz (131 kg)  05/12/20 297 lb (134.7 kg)     Health Maintenance Due  Topic Date Due  . FOOT EXAM  04/12/2020    There are no preventive care reminders to display for this patient.  Lab  Results  Component Value Date   TSH 2.31 09/17/2019   Lab Results  Component Value Date   WBC 6.4 03/30/2020   HGB 8.7 (L) 03/30/2020   HCT 29.7 (L) 03/30/2020   MCV 93.4 03/30/2020   PLT 320 03/30/2020   Lab Results  Component Value Date   NA 137 03/30/2020   K 3.6 03/30/2020   CO2 23 03/30/2020   GLUCOSE 125 (H) 03/30/2020   BUN 19 03/30/2020   CREATININE 1.22 (H) 03/30/2020   BILITOT 0.2 (L) 03/30/2020   ALKPHOS 109 03/30/2020   AST 20 03/30/2020   ALT 17 03/30/2020   PROT 6.8 03/30/2020   ALBUMIN 3.4 (L) 03/30/2020   CALCIUM 8.2 (L) 03/30/2020   ANIONGAP 9 03/30/2020   GFR 63.15 05/22/2013   Lab Results  Component Value Date   CHOL 126 01/13/2020   Lab Results  Component Value Date   HDL 41 (L) 01/13/2020   Lab Results  Component Value Date   LDLCALC 70 01/13/2020   Lab Results  Component Value Date   TRIG 69 01/13/2020   Lab Results  Component Value Date   CHOLHDL 3.1 01/13/2020   Lab Results  Component Value Date   HGBA1C 5.1 04/12/2020      Assessment & Plan:   Problem List Items Addressed This Visit      Cardiovascular and Mediastinum   Essential hypertension - Primary    Well controlled. Continue current regimen. Follow up in  6 mo         Digestive   Gastrointestinal hemorrhage    10 you with omeprazole twice daily until she is able to get in with  GI.  They will likely try to decrease her down to once a day.  Since she is had some difficulty getting an appointment scheduled with GI but go ahead and place a referral today.      Relevant Orders   Ambulatory referral to Gastroenterology     Endocrine   Diabetes mellitus type 2 in obese [facility])    Denies any hypoglycemic events.  Follow-up in 3 months.        Other   Weight loss, non-intentional   Spinal stenosis of lumbar region without neurogenic claudication    She is not convinced that the Lyrica has been helpful for her pain.  Certain to take her off completely over the next  couple of weeks.  If she does feel that her pain is ramping up we can always restart the Lyrica at a low dose and see if she feels like it is actually been helpful.      Morbid obesity ([place])    He is actually down 2 more pounds which is fantastic initially I was worried about how quickly she was losing weight but it seems to have leveled off.  That she is down about 2 more pounds since I last saw her a month ago.  Continue work on [employer] and staying as active as she is able to be.      Iron deficiency anemia due to chronic blood loss    Been on daily iron supplementation for 3 months.  Time to recheck levels.  Continue with PPI for prophylaxis since she is on Xarelto.      Relevant Orders   CBC   Fe+TIBC+Fer    Other Visit Diagnoses    Need for immunization against influenza       Relevant Orders   Flu Vaccine QUAD High Dose(Fluad) (Completed)   Peptic ulcer disease       Relevant Medications   omeprazole (PRILOSEC) 40 MG capsule   Other Relevant Orders   CBC   Ambulatory referral to Gastroenterology   Serum calcium elevated       Relevant Orders   COMPLETE METABOLIC PANEL WITH GFR      Last labs showed elevated serum calcium.  Due to recheck that as well as renal function.  Meds ordered this encounter  Medications  . omeprazole (PRILOSEC) 40 MG capsule    Sig: Take 1 capsule (40 mg total) by mouth in the morning and at bedtime.    Dispense:  180 capsule    Refill:  0    Follow-up: Return in about 3 months (around 09/15/2020) for recheck pain and iron levels.    Beatrice Lecher, MD

## 2020-06-16 NOTE — Assessment & Plan Note (Addendum)
10 you with omeprazole twice daily until she is able to get in with GI.  They will likely try to decrease her down to once a day.  Since she is had some difficulty getting an appointment scheduled with GI but go ahead and place a referral today.

## 2020-06-16 NOTE — Assessment & Plan Note (Signed)
She is not convinced that the Lyrica has been helpful for her pain.  Certain to take her off completely over the next couple of weeks.  If she does feel that her pain is ramping up we can always restart the Lyrica at a low dose and see if she feels like it is actually been helpful.

## 2020-06-16 NOTE — Assessment & Plan Note (Signed)
Well controlled. Continue current regimen. Follow up in  6 mo  

## 2020-06-17 LAB — COMPLETE METABOLIC PANEL WITH GFR
AG Ratio: 1.2 (calc) (ref 1.0–2.5)
ALT: 16 U/L (ref 6–29)
AST: 18 U/L (ref 10–35)
Albumin: 4.1 g/dL (ref 3.6–5.1)
Alkaline phosphatase (APISO): 95 U/L (ref 37–153)
BUN/Creatinine Ratio: 23 (calc) — ABNORMAL HIGH (ref 6–22)
BUN: 23 mg/dL (ref 7–25)
CO2: 24 mmol/L (ref 20–32)
Calcium: 9.4 mg/dL (ref 8.6–10.4)
Chloride: 106 mmol/L (ref 98–110)
Creat: 1.02 mg/dL — ABNORMAL HIGH (ref 0.60–0.88)
GFR, Est African American: 59 mL/min/{1.73_m2} — ABNORMAL LOW (ref >=60)
GFR, Est Non African American: 51 mL/min/{1.73_m2} — ABNORMAL LOW (ref >=60)
Globulin: 3.3 g/dL (calc) (ref 1.9–3.7)
Glucose, Bld: 108 mg/dL (ref 65–139)
Potassium: 4.3 mmol/L (ref 3.5–5.3)
Sodium: 141 mmol/L (ref 135–146)
Total Bilirubin: 0.3 mg/dL (ref 0.2–1.2)
Total Protein: 7.4 g/dL (ref 6.1–8.1)

## 2020-06-17 LAB — CBC
HCT: 44 % (ref 35.0–45.0)
Hemoglobin: 14.2 g/dL (ref 11.7–15.5)
MCH: 28.4 pg (ref 27.0–33.0)
MCHC: 32.3 g/dL (ref 32.0–36.0)
MCV: 88 fL (ref 80.0–100.0)
Platelets: 216 10*3/uL (ref 140–400)
RBC: 5 10*6/uL (ref 3.80–5.10)
RDW: 15.2 % — ABNORMAL HIGH (ref 11.0–15.0)
WBC: 6.4 10*3/uL (ref 3.8–10.8)

## 2020-06-17 LAB — IRON,TIBC AND FERRITIN PANEL
%SAT: 40 % (calc) (ref 16–45)
Ferritin: 33 ng/mL (ref 16–288)
Iron: 143 ug/dL (ref 45–160)
TIBC: 360 mcg/dL (calc) (ref 250–450)

## 2020-06-23 DIAGNOSIS — G4731 Primary central sleep apnea: Secondary | ICD-10-CM | POA: Insufficient documentation

## 2020-06-23 NOTE — Procedures (Signed)
Sleep Study Report   Patient Information     First Name: Charlotte Last Name: [name] ID: [id number]  Birth Date: May 03, 2036 Age: 84 Gender: Female  Referring Provider: Dr Lucia Gaskins BMI: 52.7 (W=297 lb, H=5' 3'')  Neck Circ.:  19 '' Epworth:  15/24   Sleep Study Information    Study Date: 06/08/20 S/H/A Version: [id number] / [id number] / 34  History:    Mrs. Fojtik reports excessive daytime sleepiness, being sleepier than she usually used to be.  She has very frequent nocturia and this is not a new issue but may have been present for 2-3 or even 4 years.  Multiple bathroom breaks mean up to 7 nocturia's.  She also has reached a stage of physical deconditioning that makes it harder for her to not get short of breath while she walks or performs rather simple tasks.  She is using a walker for ambulation, but she reports that she sleeps because of orthopnea better when her back is elevated, so a recliner is more conducive to sleep than her bed. Family members have not heard her snore and there is a good chance that this patient has central apnea due to her congestive heart failure history associated with atrial fibrillation.  She also may be much more unrefreshed because of anemia and chronic blood loss in the setting of chronic anticoagulation.  She had suffered a GI bleed in the past.  There was no alternative to resuming at least some level of anticoagulation safely.  Remains on Xarelto and no longer on ASA.   Summary & Diagnosis:    Severe Sleep Apnea at 67/h with over 60 minutes of sleep hypoxemia during this HST, making treatment urgent.  There were 29/h of central apnea and the rest was obstructive in origin. Very frequent oxygen desaturation.   Recommendations:      This degree of complex apnea with 40% central events and clinically relevant hypoxemia in a patient with atrial fibrillation, CHF, warrants an in lab titration. We are looking to treat OH Ventilation in CHF.  Interpreting Physician: Melvyn Novas, MD           Sleep Summary  Oxygen Saturation Statistics   Start Study Time: End Study Time: Total Recording Time:  9:55:08 PM 7:58:26 AM 10 h, 3 min  Total Sleep Time % REM of Sleep Time:  8 h, 43 min  22.8    Mean: 92 Minimum: 65 Maximum: 98  Mean of Desaturations Nadirs (%):   89  Oxygen Desaturation. %:  4-9 10-20 >20 Total  Events Number Total   397  58 87.1 12.7  1 0.2  456 100.0  Oxygen Saturation: <90 <=88 <85 <80 <70  Duration (minutes): Sleep % 59.5 11.3 51.7 28.3 9.9 5.4 6.0 1.1 0.7 0.1     Respiratory Indices      Total Events REM NREM All Night  pRDI: pAHI 3%: ODI 4%: pAHIc 3%: % CSR:   pAHI 4%:  585  578  456  206 29.2 522 71.8 70.7 62.8 27.4 68.9 68.1 51.7 24.3 69.5 68.7 54.2 25.0 62.0       Pulse Rate Statistics during Sleep (BPM)      Mean: 86 Minimum: 48 Maximum: 112    Indices are calculated using technically valid sleep time of 8 h, 24 min. Central-Indices are calculated using technically valid sleep time of 8 h, 14 min. pRDI/pAHI are calculated using oxi desaturations ? 3%  pAHI=68.7                                                                                              Mild              Moderate                    Severe                                                 5              15

## 2020-06-23 NOTE — Addendum Note (Signed)
Addended by: Melvyn Novas on: 06/23/2020 03:24 PM   Modules accepted: Orders

## 2020-06-23 NOTE — Progress Notes (Signed)
Summary & Diagnosis:   Severe Sleep Apnea at 67/h with over 60 minutes of sleep  hypoxemia during this HST, making treatment urgent. There were  29/h of central apnea and the rest was obstructive in origin.  Very frequent oxygen desaturation.   Recommendations:     This degree of complex apnea with 40% central events and  clinically relevant hypoxemia in a patient with atrial  fibrillation, CHF, warrants an in lab titration. We are looking  to treat OH Ventilation in CHF.  Interpreting Physician: Melvyn Novas, MD

## 2020-06-28 ENCOUNTER — Telehealth: Payer: Self-pay | Admitting: Neurology

## 2020-06-28 NOTE — Telephone Encounter (Signed)
-----   Message from Melvyn Novas, MD sent at 06/23/2020  3:24 PM EDT ----- Summary & Diagnosis:   Severe Sleep Apnea at 67/h with over 60 minutes of sleep  hypoxemia during this HST, making treatment urgent. There were  29/h of central apnea and the rest was obstructive in origin.  Very frequent oxygen desaturation.   Recommendations:     This degree of complex apnea with 40% central events and  clinically relevant hypoxemia in a patient with atrial  fibrillation, CHF, warrants an in lab titration. We are looking  to treat OH Ventilation in CHF.  Interpreting Physician: Melvyn Novas, MD

## 2020-06-28 NOTE — Telephone Encounter (Signed)

## 2020-07-01 ENCOUNTER — Other Ambulatory Visit: Payer: Self-pay | Admitting: Family Medicine

## 2020-07-01 MED ORDER — RIVAROXABAN 20 MG PO TABS: 20.0000 mg | ORAL_TABLET | Freq: Every day | ORAL | 3 refills | Status: DC

## 2020-07-06 ENCOUNTER — Other Ambulatory Visit: Payer: Self-pay

## 2020-07-06 ENCOUNTER — Ambulatory Visit (INDEPENDENT_AMBULATORY_CARE_PROVIDER_SITE_OTHER): Payer: Medicare HMO | Admitting: Neurology

## 2020-07-06 DIAGNOSIS — G4731 Primary central sleep apnea: Secondary | ICD-10-CM

## 2020-07-06 DIAGNOSIS — I5042 Chronic combined systolic (congestive) and diastolic (congestive) heart failure: Secondary | ICD-10-CM

## 2020-07-06 DIAGNOSIS — G4734 Idiopathic sleep related nonobstructive alveolar hypoventilation: Secondary | ICD-10-CM

## 2020-07-06 DIAGNOSIS — I480 Paroxysmal atrial fibrillation: Secondary | ICD-10-CM

## 2020-07-06 DIAGNOSIS — G4730 Sleep apnea, unspecified: Secondary | ICD-10-CM

## 2020-07-09 DIAGNOSIS — G4730 Sleep apnea, unspecified: Secondary | ICD-10-CM | POA: Insufficient documentation

## 2020-07-09 DIAGNOSIS — G4734 Idiopathic sleep related nonobstructive alveolar hypoventilation: Secondary | ICD-10-CM | POA: Insufficient documentation

## 2020-07-09 NOTE — Addendum Note (Signed)
Addended by: Melvyn Novas on: 07/09/2020 01:40 PM   Modules accepted: Orders

## 2020-07-09 NOTE — Addendum Note (Signed)
Addended by: Melvyn Novas on: 07/09/2020 01:38 PM   Modules accepted: Orders

## 2020-07-09 NOTE — Progress Notes (Signed)
PLANS/RECOMMENDATIONS: The interface was a DreamWear FFM in  small.  ASV: the effective settings for therapy were 15 cm max pressure  support, 4 cm minimum pressure support and EPAP of 10 cm water.

## 2020-07-09 NOTE — Procedures (Signed)
PATIENT'S NAME:  Charlotte, [name] DOB:      1936/02/09      MR#:    [id number]     DATE OF RECORDING: 07/06/2020 REFERRING M.D.:  Naomie Dean, MD Study Performed:   Titration to ASV HISTORY:  Chief concern according to patient:   [name] is a patient who reports excessive daytime sleepiness being more sleepy than she usually used to be, has orthopnea, CHF, atrial fibrillation and had a stroke.      The results of her home sleep study from 06-09-2020 made return to an attended study necessary:  Severe Sleep Apnea at 67/h with over 60 minutes of sleep hypoxemia during this HST, making treatment urgent.  There were 29/h central apnea and the rest was obstructive in origin. Very frequent oxygen desaturation was noted.   Recommendations: This degree of complex apnea with 40% central events and clinically relevant hypoxemia in a patient with atrial fibrillation, CHF, warrants in- lab titration. We are looking to treat OH Ventilation in CHF.   The patient endorsed the Epworth Sleepiness Scale at 15 points.   The patient's weight 297 pounds with a height of 63 (inches), resulting in a BMI of 52.7 kg/m2. The patient's neck circumference measured 19 inches.  CURRENT MEDICATIONS: Tylenol, Lipitor, Coreg, Lasix, Cozaar, Klor-con, Lyrica, Xarelto, Prilosec   PROCEDURE:  This is a multichannel digital polysomnogram utilizing the SomnoStar 11.2 system.  Electrodes and sensors were applied and monitored per AASM Specifications.   EEG, EOG, Chin and Limb EMG, were sampled at 200 Hz.  ECG, Snore and Nasal Pressure, Thermal Airflow, Respiratory Effort, CPAP Flow and Pressure, Oximetry was sampled at 50 Hz. Digital video and audio were recorded.      CPAP was initiated at 5 cmH20 with heated humidity per AASM split night standards and pressure was advanced to 12cmH20 because of ongoing hypopneas, apneas and desaturations.  Since this approach failed, a BiPAP titration from 13/9 cm was started, finally ST was added at  15/10 cm water - none controlled apnea.  At a ASV pressure of 15/10/4 cm H20, there was a reduction of the AHI to 0 with improvement of sleep apnea and allowed for 163 minutes of sleep. The interface was a DreamWear FFM in small.   Lights Out was at 21:59 and Lights On at 05:00. Total recording time (TRT) was 417.5 minutes, with a total sleep time (TST) of 338 minutes. The patient's sleep latency was 86.5 minutes. REM latency was 81 minutes.  The sleep efficiency was 81.1 %.    SLEEP ARCHITECTURE: WASO (Wake after sleep onset) was 52 minutes.  There were 66.5 minutes in Stage N1, 142.5 minutes Stage N2, 67 minutes Stage N3 and 62 minutes in Stage REM.  The percentage of Stage N1 was 19.7%, Stage N2 was 42.2%, Stage N3 was 19.8% and Stage R (REM sleep) was 18.3%. The sleep architecture was notable for REM rebound.  RESPIRATORY ANALYSIS:  There was a total of 103 respiratory events: 0 obstructive apneas, 13 central apneas and 0 mixed apneas with a total of 13 apneas and an apnea index (AI) of 2.3 /hour. There were 90 hypopneas with a hypopnea index of 16./hour. The patient also had 0 respiratory event related arousals (RERAs). The total APNEA/HYPOPNEA INDEX (AHI) was 18.3 /hour .  3 events occurred in REM sleep and 100 events in NREM.  The REM AHI was 2.9 /hour versus a non-REM AHI of 21.7 /hour.  The patient spent 0 minutes of total  sleep time in the supine position and 338 minutes in non-supine.   OXYGEN SATURATION & C02:  The baseline 02 saturation was 94%, with the lowest being 87%. Time spent below 89% saturation equaled 0 minutes. The arousals were noted as: 20 were spontaneous, 0 were associated with PLMs, 25 were associated with respiratory events. The patient had a total of 0 Periodic Limb Movements.   Audio and video analysis did not show any abnormal or unusual movements, behaviors, phonations or vocalizations.  EKG in atrial fibrillation.  DIAGNOSIS: CPAP, BiPAP and BiPAP ST have failed to  control central-complex apnea in this patient, who finally responded to ASV: the effective settings for therapy were 15 cm max pressure support, 54 cm minimum pressure support and EPAP of 10 cm water.   PLANS/RECOMMENDATIONS: The interface was a DreamWear FFM in small.  ASV: the effective settings for therapy were 15 cm max pressure support, 54 cm minimum pressure support and EPAP of 10 cm water.   DISCUSSION: A follow up appointment will be scheduled in the Sleep Clinic at [facility].   Please call [phone] with any questions.      I certify that I have reviewed the entire raw data recording prior to the issuance of this report in accordance with the Standards of Accreditation of the American Academy of Sleep Medicine (AASM)   Melvyn Novas, M.D. Diplomat, Biomedical engineer of Psychiatry and Neurology  Diplomat, Biomedical engineer of Sleep Medicine Wellsite geologist, [employer] [facility] [employer]

## 2020-07-12 ENCOUNTER — Encounter: Payer: Self-pay | Admitting: Neurology

## 2020-07-12 ENCOUNTER — Telehealth: Payer: Self-pay | Admitting: Neurology

## 2020-07-12 NOTE — Telephone Encounter (Signed)
-----   Message from Melvyn Novas, MD sent at 07/09/2020  1:38 PM EDT ----- DIAGNOSIS: CPAP, BiPAP and BiPAP ST have failed to control  central-complex apnea in this patient, who finally responded to  ASV: the effective settings for therapy were 15 cm max pressure  support, 54 cm minimum pressure support and EPAP of 10 cm water.   PLANS/RECOMMENDATIONS: The interface was a DreamWear FFM in  small.  ASV: the effective settings for therapy were 15 cm max pressure  support, 4 cm minimum pressure support and EPAP of 10 cm water.

## 2020-07-12 NOTE — Telephone Encounter (Signed)
I called pt. I advised pt that Dr. Vickey Huger reviewed their sleep study results and found that pt was best tolerated with ASV. Dr. Vickey Huger recommends that pt starts ASV 15/10/4 cm water pressure. I reviewed PAP compliance expectations with the pt. Pt is agreeable to starting a CPAP. I advised pt that an order will be sent to a DME, [employer], and [employer] will call the pt within about one week after they file with the pt's insurance. [employer] will show the pt how to use the machine, fit for masks, and troubleshoot the CPAP if needed. A follow up appt was made for insurance purposes with Shawnie Dapper, NP on Jan 13,2022 at 3 pm. Pt verbalized understanding to arrive 15 minutes early and bring their CPAP. A letter with all of this information in it will be mailed to the pt as a reminder. I verified with the pt that the address we have on file is correct. Pt verbalized understanding of results. Pt had no questions at this time but was encouraged to call back if questions arise. I have sent the order to [employer] and have received confirmation that they have received the order.

## 2020-07-22 ENCOUNTER — Telehealth: Payer: Self-pay | Admitting: Family Medicine

## 2020-07-22 NOTE — Telephone Encounter (Signed)
Prescription for Knee/Wrist Support  Should be faxed today. Please confirm when that comes in.   [facility] [phone] Psychiatric nurse Line) Glo Herring.

## 2020-07-26 NOTE — Telephone Encounter (Signed)
No fax received. Pt unaware of this.

## 2020-07-28 NOTE — Telephone Encounter (Signed)
De Nurse called back requesting return call.   I returned call but got VM- left msg that I was calling from Dr Shelah Lewandowsky office (did not leave pt info since it was generic VM) and advised him no forms received and patient did not have knowledge of any forms.  Recommend he reach back out to patient

## 2020-08-12 DIAGNOSIS — G4731 Primary central sleep apnea: Secondary | ICD-10-CM | POA: Diagnosis not present

## 2020-08-12 DIAGNOSIS — G4733 Obstructive sleep apnea (adult) (pediatric): Secondary | ICD-10-CM | POA: Diagnosis not present

## 2020-08-13 ENCOUNTER — Other Ambulatory Visit: Payer: Self-pay | Admitting: Cardiology

## 2020-08-13 DIAGNOSIS — I4819 Other persistent atrial fibrillation: Secondary | ICD-10-CM

## 2020-08-15 ENCOUNTER — Telehealth: Payer: Self-pay | Admitting: Family Medicine

## 2020-08-15 NOTE — Telephone Encounter (Signed)
Received order form for [employer]. She has to be on insulin 3 time or more per day for then to cover the monitor.  I will complete the form but I am just letting her know she will not quality.

## 2020-08-18 NOTE — Telephone Encounter (Signed)
Spoke with patient's daughter. They did not request this, she no longer checks her blood sugar since her last A1Cs have been good. She is not sure where this came from and wondering if this is on the correct patient? They definitely do not want this monitor. Thanks.

## 2020-09-09 ENCOUNTER — Other Ambulatory Visit: Payer: Self-pay | Admitting: Family Medicine

## 2020-09-09 DIAGNOSIS — K279 Peptic ulcer, site unspecified, unspecified as acute or chronic, without hemorrhage or perforation: Secondary | ICD-10-CM

## 2020-09-10 ENCOUNTER — Ambulatory Visit: Payer: Medicare HMO | Attending: Internal Medicine

## 2020-09-10 DIAGNOSIS — Z23 Encounter for immunization: Secondary | ICD-10-CM

## 2020-09-10 NOTE — Progress Notes (Signed)
   Covid-19 Vaccination Clinic  Name:  Charlotte [name]    MRN: [id number] DOB: Feb 06, 1936  09/10/2020  [name] was observed post Covid-19 immunization for 15 minutes without incident. She was provided with Vaccine Information Sheet and instruction to access the V-Safe system.   [name] was instructed to call 911 with any severe reactions post vaccine: Marland Kitchen Difficulty breathing  . Swelling of face and throat  . A fast heartbeat  . A bad rash all over body  . Dizziness and weakness   Immunizations Administered    Name Date Dose VIS Date Route   Pfizer COVID-19 Vaccine 09/10/2020  1:16 PM 0.3 mL 07/27/2020 Intramuscular   Manufacturer: [employer], [employer]   Lot: [id number]   NDC: [address]-7

## 2020-09-11 DIAGNOSIS — G4731 Primary central sleep apnea: Secondary | ICD-10-CM | POA: Diagnosis not present

## 2020-09-15 ENCOUNTER — Ambulatory Visit (INDEPENDENT_AMBULATORY_CARE_PROVIDER_SITE_OTHER): Payer: Medicare HMO | Admitting: Family Medicine

## 2020-09-15 ENCOUNTER — Encounter: Payer: Self-pay | Admitting: Family Medicine

## 2020-09-15 ENCOUNTER — Other Ambulatory Visit: Payer: Self-pay

## 2020-09-15 VITALS — BP 154/63 | HR 89 | Ht 63.0 in | Wt 297.0 lb

## 2020-09-15 DIAGNOSIS — E669 Obesity, unspecified: Secondary | ICD-10-CM

## 2020-09-15 DIAGNOSIS — E1169 Type 2 diabetes mellitus with other specified complication: Secondary | ICD-10-CM | POA: Diagnosis not present

## 2020-09-15 DIAGNOSIS — D500 Iron deficiency anemia secondary to blood loss (chronic): Secondary | ICD-10-CM

## 2020-09-15 DIAGNOSIS — I10 Essential (primary) hypertension: Secondary | ICD-10-CM

## 2020-09-15 DIAGNOSIS — G4730 Sleep apnea, unspecified: Secondary | ICD-10-CM

## 2020-09-15 LAB — POCT GLYCOSYLATED HEMOGLOBIN (HGB A1C): Hemoglobin A1C: 5.9 % — AB (ref 4.0–5.6)

## 2020-09-15 NOTE — Assessment & Plan Note (Signed)
Working on getting a new facemask and using humidified air through the CPAP just really encouraged her to continue to work at trying to use it more consistently the goal is to [place] it at least 4 hours but ultimately the goal is for her to [place] it all night.

## 2020-09-15 NOTE — Progress Notes (Addendum)
Established Patient Office Visit  Subjective:  Patient ID: Charlotte [name], female    DOB: 06/30/1936  Age: 84 y.o. MRN: [id number]  CC:  Chief Complaint  Patient presents with  . Hypertension    HPI Charlotte [name] presents for   Diabetes - no hypoglycemic events. No wounds or sores that are not healing well. No increased thirst or urination. Checking glucose at home. Taking medications as prescribed without any side effects.  Hypertension- Pt denies chest pain, SOB, dizziness, or heart palpitations.  Taking meds as directed w/o problems.  Denies medication side effects.    Iron deficiency-she has been taking her iron every other day.  Tolerating it well without any problems.  She did let me know that she has been trying to [place]itz her CPAP some but she still struggling a little bit.  Try to work on getting a new mask right now because she is getting a lot of air leaks with her current one.  She is now completely off the Lyrica and doing really well.  Past Medical History:  Diagnosis Date  . Aortic insufficiency   . Atrial fibrillation ([place])    a. s/p DCCV 03/2013  . CAD (coronary artery disease)    LHC 06/2010: EF 55%, mild plaque in the LAD 20-30%, otherwise normal coronary arteries  . CATARACTS   . Chronic systolic heart failure ([place])    in setting of AFib;  Echocardiogram 02/2013: Moderate LVH, EF 25-30%, anteroseptal and apical HK, moderate AI, MAC, moderate MR, moderate LAE, mild RAE, PASP 32 => after DCCV f/u Echo 8/14: Moderate LVH, EF 60-65%, normal wall motion, grade 1 diastolic dysfunction, moderate AI, moderate LAE  . Complication of anesthesia 2009   nausea and vomitting  . GERD (gastroesophageal reflux disease)   . HYPERLIPIDEMIA, MILD   . HYPERTENSION, BENIGN SYSTEMIC   . Irritable bowel syndrome   . LBBB (left bundle branch block)   . MEDIAL EPICONDYLITIS   . NSVT (nonsustained ventricular tachycardia) ([place])    during Dob Echo 2011 => normal cath  . OSTEOARTHRITIS    . OSTEOPENIA   . PONV (postoperative nausea and vomiting)   . Pre-diabetes   . RHINITIS, ALLERGIC   . Stroke ([place])   . SYNCOPE   . VITAMIN D DEFICIENCY     Past Surgical History:  Procedure Laterality Date  . BIOPSY  03/01/2020   Procedure: BIOPSY;  Surgeon: Otis Brace, MD;  Location: [place];  Service: Gastroenterology;;  . CARDIOVERSION N/A 04/01/2013   Procedure: CARDIOVERSION;  Surgeon: Josue Hector, MD;  Location: [facility] ENDOSCOPY;  Service: Cardiovascular;  Laterality: N/A;  . CARDIOVERSION N/A 07/16/2017   Procedure: CARDIOVERSION;  Surgeon: Pixie Casino, MD;  Location: [facility] ENDOSCOPY;  Service: Cardiovascular;  Laterality: N/A;  . CARDIOVERSION N/A 07/07/2018   Procedure: CARDIOVERSION;  Surgeon: Pixie Casino, MD;  Location: [place];  Service: Cardiovascular;  Laterality: N/A;  . CATARACT EXTRACTION, BILATERAL  2012   Dr. Herbert Deaner  . COLONOSCOPY WITH PROPOFOL N/A 05/23/2017   Procedure: COLONOSCOPY WITH PROPOFOL;  Surgeon: Wonda Horner, MD;  Location: [facility] ENDOSCOPY;  Service: Endoscopy;  Laterality: N/A;  . ESOPHAGOGASTRODUODENOSCOPY N/A 03/01/2020   Procedure: ESOPHAGOGASTRODUODENOSCOPY (EGD);  Surgeon: Otis Brace, MD;  Location: [facility] ENDOSCOPY;  Service: Gastroenterology;  Laterality: N/A;  . ESOPHAGOGASTRODUODENOSCOPY (EGD) WITH PROPOFOL N/A 05/23/2017   Procedure: ESOPHAGOGASTRODUODENOSCOPY (EGD) WITH PROPOFOL;  Surgeon: Wonda Horner, MD;  Location: [facility] ENDOSCOPY;  Service: Endoscopy;  Laterality: N/A;  . IR  KYPHO LUMBAR INC FX REDUCE BONE BX UNI/BIL CANNULATION INC/IMAGING  06/06/2017  . IR RADIOLOGIST EVAL & MGMT  07/01/2017  . RADICAL HYSTERECTOMY    . REPLACEMENT TOTAL KNEE     11/03/07 right....... 03/29/08 left    Family History  Problem Relation Age of Onset  . Arrhythmia Mother        afib  . Alzheimer's disease Mother   . Stroke Mother   . Colon cancer Father   . Heart attack Father 43  . Diabetes Sister   . Arrhythmia Sister         afib  . Stroke Maternal Grandmother     Social History   Socioeconomic History  . Marital status: Divorced    Spouse name: Not on file  . Number of children: 1  . Years of education: Not on file  . Highest education level: High school graduate  Occupational History  . Occupation: Retired    Fish farm manager: RETIRED  Tobacco Use  . Smoking status: Former Smoker    Quit date: 10/08/1970    Years since quitting: 49.9  . Smokeless tobacco: Never Used  Vaping Use  . Vaping Use: Never used  Substance and Sexual Activity  . Alcohol use: No  . Drug use: No  . Sexual activity: Not on file  Other Topics Concern  . Not on file  Social History Narrative   Lives by herself at home   Right handed   Caffeine: 1 cup/day at the most. Recently switched to decaf coffee.   Social Determinants of Health   Financial Resource Strain: Not on file  Food Insecurity: Not on file  Transportation Needs: Not on file  Physical Activity: Not on file  Stress: Not on file  Social Connections: Not on file  Intimate Partner Violence: Not on file    Outpatient Medications Prior to Visit  Medication Sig Dispense Refill  . acetaminophen (TYLENOL) 500 MG tablet Take 1,000 mg by mouth 3 (three) times daily.    Marland Kitchen atorvastatin (LIPITOR) 40 MG tablet Take 1 tablet (40 mg total) by mouth daily. 90 tablet 6  . carvedilol (COREG) 12.5 MG tablet TAKE 1 TABLET BY MOUTH TWICE A DAY WITH A MEAL 90 tablet 3  . Ferrous Sulfate (IRON PO) Take 1 tablet by mouth every other day.    . furosemide (LASIX) 20 MG tablet TAKE 1 TABLET BY MOUTH TWICE A DAY 60 tablet 4  . losartan (COZAAR) 25 MG tablet TAKE 1 TABLET BY MOUTH EVERY DAY 90 tablet 3  . omeprazole (PRILOSEC) 40 MG capsule TAKE 1 CAPSULE (40 MG TOTAL) BY MOUTH IN THE MORNING AND AT BEDTIME. 180 capsule 0  . polyvinyl alcohol-povidone (HYPOTEARS) 1.4-0.6 % ophthalmic solution Place 1-2 drops into both eyes daily as needed (dry eyes).     . potassium chloride (KLOR-CON) 10  MEQ tablet TAKE 1 TABLET (10 MEQ TOTAL) BY MOUTH 2 (TWO) TIMES DAILY. 180 tablet 3  . rivaroxaban (XARELTO) 20 MG TABS tablet Take 1 tablet (20 mg total) by mouth daily with supper. 90 tablet 3   No facility-administered medications prior to visit.    Allergies  Allergen Reactions  . Metformin And Related Other (See Comments)    Renal failure  . Amlodipine Other (See Comments)    HEADACHE    ROS Review of Systems    Objective:    Physical Exam Constitutional:      Appearance: She is well-developed and well-nourished.  HENT:     Head:  Normocephalic and atraumatic.  Cardiovascular:     Rate and Rhythm: Normal rate and regular rhythm.     Heart sounds: Normal heart sounds.  Pulmonary:     Effort: Pulmonary effort is normal.     Breath sounds: Normal breath sounds.  Skin:    General: Skin is warm and dry.  Neurological:     Mental Status: She is alert and oriented to person, place, and time.  Psychiatric:        Mood and Affect: Mood and affect normal.        Behavior: Behavior normal.     BP (!) 154/63   Pulse 89   Ht _0  (1.6 m)   Wt 297 lb (134.7 kg)   SpO2 97%   BMI 52.61 kg/m  Wt Readings from Last 3 Encounters:  09/15/20 297 lb (134.7 kg)  06/16/20 295 lb (133.8 kg)  06/08/20 288 lb 12.8 oz (131 kg)     Health Maintenance Due  Topic Date Due  . TETANUS/TDAP  07/26/2019  . FOOT EXAM  04/12/2020    There are no preventive care reminders to display for this patient.  Lab Results  Component Value Date   TSH 2.31 09/17/2019   Lab Results  Component Value Date   WBC 6.4 06/16/2020   HGB 14.2 06/16/2020   HCT 44.0 06/16/2020   MCV 88.0 06/16/2020   PLT 216 06/16/2020   Lab Results  Component Value Date   NA 141 06/16/2020   K 4.3 06/16/2020   CO2 24 06/16/2020   GLUCOSE 108 06/16/2020   BUN 23 06/16/2020   CREATININE 1.02 (H) 06/16/2020   BILITOT 0.3 06/16/2020   ALKPHOS 109 03/30/2020   AST 18 06/16/2020   ALT 16 06/16/2020   PROT  7.4 06/16/2020   ALBUMIN 3.4 (L) 03/30/2020   CALCIUM 9.4 06/16/2020   ANIONGAP 9 03/30/2020   GFR 63.15 05/22/2013   Lab Results  Component Value Date   CHOL 126 01/13/2020   Lab Results  Component Value Date   HDL 41 (L) 01/13/2020   Lab Results  Component Value Date   LDLCALC 70 01/13/2020   Lab Results  Component Value Date   TRIG 69 01/13/2020   Lab Results  Component Value Date   CHOLHDL 3.1 01/13/2020   Lab Results  Component Value Date   HGBA1C 5.9 (A) 09/15/2020      Assessment & Plan:   Problem List Items Addressed This Visit      Cardiovascular and Mediastinum   Essential hypertension    BP is up today. Will monitor.          Respiratory   Severe sleep apnea    Working on getting a new facemask and using humidified air through the CPAP just really encouraged her to continue to work at trying to use it more consistently the goal is to [place] it at least 4 hours but ultimately the goal is for her to [place] it all night.        Endocrine   Diabetes mellitus type 2 in obese (HCC) - Primary    A1C is 5.9. looks great! F/U in 4 months.         Relevant Orders   POCT glycosylated hemoglobin (Hb A1C) (Completed)     Other   Iron deficiency anemia due to chronic blood loss   Relevant Medications   Ferrous Sulfate (IRON PO)   Other Relevant Orders   Fe+TIBC+Fer (Completed)  No orders of the defined types were placed in this encounter.   Follow-up: Return in about 4 months (around 01/14/2021) for Diabetes follow-up.    Beatrice Lecher, MD

## 2020-09-15 NOTE — Assessment & Plan Note (Signed)
A1C is 5.9. looks great! F/U in 4 months.

## 2020-09-15 NOTE — Assessment & Plan Note (Signed)
BP is up today. Will monitor.

## 2020-09-16 LAB — IRON,TIBC AND FERRITIN PANEL
%SAT: 34 % (calc) (ref 16–45)
Ferritin: 51 ng/mL (ref 16–288)
Iron: 104 ug/dL (ref 45–160)
TIBC: 309 mcg/dL (calc) (ref 250–450)

## 2020-10-20 ENCOUNTER — Encounter: Payer: Self-pay | Admitting: Family Medicine

## 2020-10-20 ENCOUNTER — Ambulatory Visit: Payer: Medicare Other | Admitting: Family Medicine

## 2020-10-20 VITALS — BP 142/89 | HR 81 | Ht 63.0 in | Wt 296.4 lb

## 2020-10-20 DIAGNOSIS — Z7189 Other specified counseling: Secondary | ICD-10-CM

## 2020-10-20 DIAGNOSIS — G4731 Primary central sleep apnea: Secondary | ICD-10-CM

## 2020-10-20 DIAGNOSIS — I6381 Other cerebral infarction due to occlusion or stenosis of small artery: Secondary | ICD-10-CM | POA: Diagnosis not present

## 2020-10-20 NOTE — Progress Notes (Addendum)
PATIENT: Charlotte [name] DOB: 10/12/1935  REASON FOR VISIT: follow up HISTORY FROM: patient  Chief Complaint  Patient presents with  . Injections    RM 1 alone daughter (donna) Pt is well, said she hasnt been using CPAP correctly.      HISTORY OF PRESENT ILLNESS: Today 10/20/20 Charlotte [name] is a 85 y.o. female here today for follow up for recently diagnosed severe complex sleep apnea started on ASV therapy.  HST showed AHI 67/hr with 29 central events. She had hypoxemia for > 60 minutes. Titration study on 9/29 showed events were well controlled on ASV (Dreamwear FFM, small).  She reports that she is trying to adjust to therapy.  She had a very difficult time in the beginning with air leaking from the DreamWear mask. She has recently changed to a different fullface mask that covers the bridge of her nose.  She feels that she is doing better with this mask.  She does continue to hear air that prevents her from being able to go to sleep.  Compliance report dated 09/19/2020 through 10/18/2020 reveals that she used ASV 22 of the past 30 days for compliance of 73%.  She used ASV greater than 4 hours to of the past 30 days for compliance of 7%.  Average usage on days used was 1 hour and 27 minutes.  Residual AHI was 8.2 with a EPAP of 10 cm of water and pressure support of 4-15.  There was a significant leak noted in the 95th percentile of 37.1 L/min.   She was last seen by Dr Jaynee Eagles for stroke follow up in 03/2020. She continues Xarelto. She was referred to GI for gastric bleeds but never seen. She has been doing well on pantoprazole. She also continues atorvastatin 80m.    HISTORY: (copied from previous note)  Charlotte [name] 85year- old Caucasian female  Stroke patientand seen here upon referralon 05/12/2020 from Dr AJaynee Eagles  Chiefconcernaccording to patient :  Mrs. WJacqualin Combesreports excessive daytime sleepiness being more sleepy than she usually used to be.  She has very frequent nocturia and  this is not a new issue but may have been present for 2-3 or even 4 years.  Multiple bathroom breaks means up to 7 nocturia's.  She also has reached a stage of physical deconditioning that makes it harder for her to not get short of breath while she walks or performs rather simple tasks.  She is using a walker for ambulation, but she reports that she sleeps because of orthopnea better than her back is elevated so a recliner is more conducive to sleep than her bed. Family members have not heard her snore and there is a good chance that this patient has central apnea due to her congestive heart failure history associated with atrial fibrillation.  She also may be much more unrefreshed because of anemia and chronic blood loss in the setting of chronic anticoagulation.  She had suffered a GI bleed in the past.  There was no alternative to resuming at least some level of anticoagulation safely.  Remains on Xarelto and  No longer on ASA.   I have the pleasure of seeing Charlotte [name] today,a right -handed White or Caucasian female with a possible sleep disorder. She has a  has a past medical history of Aortic insufficiency, Atrial fibrillation ([place], CAD (coronary artery disease), CATARACTS, Chronic systolic heart failure ([place], Complication of anesthesia (2009), GERD (gastroesophageal reflux disease), HYPERLIPIDEMIA, MILD, HYPERTENSION, BENIGN  SYSTEMIC, Irritable bowel syndrome, LBBB (left bundle branch block), MEDIAL EPICONDYLITIS, NSVT (nonsustained ventricular tachycardia) (HCC), OSTEOARTHRITIS, OSTEOPENIA, PONV (postoperative nausea and vomiting), Pre-diabetes, RHINITIS, ALLERGIC, Stroke ([place]), SYNCOPE, and VITAMIN D DEFICIENCY.  Social history:Patient is retired from [employer], and lives in a household with alone.  Family status is widowed  with adult children, her daughter is her neighbour.   She likes puzzles and sudoko. No longer reads.  Pets are not present. Tobacco use "not in 50  years ". ETOH use ;none ,  Caffeine intake in form of Coffee( some) Soda( /) Tea ( some) or energy drinks.  Sleep habits are as follows:The patient's dinner time is between 6 PM.  The patient goes to bed at 10.30 PM and continues to sleep for one hour at a time , fragmented  hourly, wakes for nocturia- bathroom breaks. The preferred sleep position is in bed in a lateral or supine position, but she changes soon to the recliner-  About 4-5 AM.  9 AM is the usual rise time. The patient wakes up spontaneously.  She reports not feeling refreshed or restored in AM, with symptoms such as dry mouth and residual fatigue.  Naps are taken frequently,  She will doze off - lasting from 20-60 minutes and are more refreshing than nocturnal sleep.   REVIEW OF SYSTEMS: Out of a complete 14 system review of symptoms, the patient complains only of the following symptoms, fatigue, shob, insomnia and all other reviewed systems are negative.  FSS:45   ALLERGIES: Allergies  Allergen Reactions  . Metformin And Related Other (See Comments)    Renal failure  . Amlodipine Other (See Comments)    HEADACHE    HOME MEDICATIONS: Outpatient Medications Prior to Visit  Medication Sig Dispense Refill  . acetaminophen (TYLENOL) 500 MG tablet Take 1,000 mg by mouth 3 (three) times daily.    Marland Kitchen atorvastatin (LIPITOR) 40 MG tablet Take 1 tablet (40 mg total) by mouth daily. 90 tablet 6  . carvedilol (COREG) 12.5 MG tablet TAKE 1 TABLET BY MOUTH TWICE A DAY WITH A MEAL 90 tablet 3  . Ferrous Sulfate (IRON PO) Take 1 tablet by mouth every other day.    . furosemide (LASIX) 20 MG tablet TAKE 1 TABLET BY MOUTH TWICE A DAY 60 tablet 4  . losartan (COZAAR) 25 MG tablet TAKE 1 TABLET BY MOUTH EVERY DAY 90 tablet 3  . polyvinyl alcohol-povidone (HYPOTEARS) 1.4-0.6 % ophthalmic solution Place 1-2 drops into both eyes daily as needed (dry eyes).     . potassium chloride (KLOR-CON) 10 MEQ tablet TAKE 1 TABLET (10 MEQ TOTAL) BY  MOUTH 2 (TWO) TIMES DAILY. 180 tablet 3  . rivaroxaban (XARELTO) 20 MG TABS tablet Take 1 tablet (20 mg total) by mouth daily with supper. 90 tablet 3  . omeprazole (PRILOSEC) 40 MG capsule TAKE 1 CAPSULE (40 MG TOTAL) BY MOUTH IN THE MORNING AND AT BEDTIME. 180 capsule 0   No facility-administered medications prior to visit.    PAST MEDICAL HISTORY: Past Medical History:  Diagnosis Date  . Aortic insufficiency   . Atrial fibrillation ([place])    a. s/p DCCV 03/2013  . CAD (coronary artery disease)    LHC 06/2010: EF 55%, mild plaque in the LAD 20-30%, otherwise normal coronary arteries  . CATARACTS   . Chronic systolic heart failure ([place])    in setting of AFib;  Echocardiogram 02/2013: Moderate LVH, EF 25-30%, anteroseptal and apical HK, moderate AI, MAC, moderate  MR, moderate LAE, mild RAE, PASP 32 => after DCCV f/u Echo 8/14: Moderate LVH, EF 60-65%, normal wall motion, grade 1 diastolic dysfunction, moderate AI, moderate LAE  . Complication of anesthesia 2009   nausea and vomitting  . GERD (gastroesophageal reflux disease)   . HYPERLIPIDEMIA, MILD   . HYPERTENSION, BENIGN SYSTEMIC   . Irritable bowel syndrome   . LBBB (left bundle branch block)   . MEDIAL EPICONDYLITIS   . NSVT (nonsustained ventricular tachycardia) ([place])    during Dob Echo 2011 => normal cath  . OSTEOARTHRITIS   . OSTEOPENIA   . PONV (postoperative nausea and vomiting)   . Pre-diabetes   . RHINITIS, ALLERGIC   . Stroke ([place])   . SYNCOPE   . VITAMIN D DEFICIENCY     PAST SURGICAL HISTORY: Past Surgical History:  Procedure Laterality Date  . BIOPSY  03/01/2020   Procedure: BIOPSY;  Surgeon: Otis Brace, MD;  Location: [place];  Service: Gastroenterology;;  . CARDIOVERSION N/A 04/01/2013   Procedure: CARDIOVERSION;  Surgeon: Josue Hector, MD;  Location: [facility] ENDOSCOPY;  Service: Cardiovascular;  Laterality: N/A;  . CARDIOVERSION N/A 07/16/2017   Procedure: CARDIOVERSION;  Surgeon: Pixie Casino,  MD;  Location[facility] ENDOSCOPY;  Service: Cardiovascular;  Laterality: N/A;  . CARDIOVERSION N/A 07/07/2018   Procedure: CARDIOVERSION;  Surgeon: Pixie Casino, MD;  Location: [place];  Service: Cardiovascular;  Laterality: N/A;  . CATARACT EXTRACTION, BILATERAL  2012   Dr. Herbert Deaner  . COLONOSCOPY WITH PROPOFOL N/A 05/23/2017   Procedure: COLONOSCOPY WITH PROPOFOL;  Surgeon: Wonda Horner, MD;  Location: [facility] ENDOSCOPY;  Service: Endoscopy;  Laterality: N/A;  . ESOPHAGOGASTRODUODENOSCOPY N/A 03/01/2020   Procedure: ESOPHAGOGASTRODUODENOSCOPY (EGD);  Surgeon: Otis Brace, MD;  Location[facility] ENDOSCOPY;  Service: Gastroenterology;  Laterality: N/A;  . ESOPHAGOGASTRODUODENOSCOPY (EGD) WITH PROPOFOL N/A 05/23/2017   Procedure: ESOPHAGOGASTRODUODENOSCOPY (EGD) WITH PROPOFOL;  Surgeon: Wonda Horner, MD;  Location[facility] ENDOSCOPY;  Service: Endoscopy;  Laterality: N/A;  . IR KYPHO LUMBAR INC FX REDUCE BONE BX UNI/BIL CANNULATION INC/IMAGING  06/06/2017  . IR RADIOLOGIST EVAL & MGMT  07/01/2017  . RADICAL HYSTERECTOMY    . REPLACEMENT TOTAL KNEE     11/03/07 right....... 03/29/08 left    FAMILY HISTORY: Family History  Problem Relation Age of Onset  . Arrhythmia Mother        afib  . Alzheimer's disease Mother   . Stroke Mother   . Colon cancer Father   . Heart attack Father 40  . Diabetes Sister   . Arrhythmia Sister        afib  . Stroke Maternal Grandmother     SOCIAL HISTORY: Social History   Socioeconomic History  . Marital status: Divorced    Spouse name: Not on file  . Number of children: 1  . Years of education: Not on file  . Highest education level: High school graduate  Occupational History  . Occupation: Retired    Fish farm manager: RETIRED  Tobacco Use  . Smoking status: Former Smoker    Quit date: 10/08/1970    Years since quitting: 50.0  . Smokeless tobacco: Never Used  Vaping Use  . Vaping Use: Never used  Substance and Sexual Activity  . Alcohol use: No  . Drug use:  No  . Sexual activity: Not on file  Other Topics Concern  . Not on file  Social History Narrative   Lives by herself at home   Right handed   Caffeine: 1 cup/day  at the most. Recently switched to decaf coffee.   Social Determinants of Health   Financial Resource Strain: Not on file  Food Insecurity: Not on file  Transportation Needs: Not on file  Physical Activity: Not on file  Stress: Not on file  Social Connections: Not on file  Intimate Partner Violence: Not on file     PHYSICAL EXAM  Vitals:   10/20/20 1509  BP: (!) 142/89  Pulse: 81  Weight: 296 lb 6.4 oz (134.4 kg)  Height: _0  (1.6 m)   Body mass index is 52.5 kg/m.  Generalized: Well developed, in no acute distress  Cardiology: normal rate and rhythm, no murmur noted Respiratory: clear to auscultation bilaterally  Neurological examination  Mentation: Alert oriented to time, place, history taking. Follows all commands speech and language fluent Cranial nerve II-XII: Pupils were equal round reactive to light. Extraocular movements were full, visual field were full  Motor: The motor testing reveals 5 over 5 strength of all 4 extremities. Good symmetric motor tone is noted throughout.  Gait and station: Gait is stable with Rollator   DIAGNOSTIC DATA (LABS, IMAGING, TESTING) - I reviewed patient records, labs, notes, testing and imaging myself where available.  No flowsheet data found.   Lab Results  Component Value Date   WBC 6.4 06/16/2020   HGB 14.2 06/16/2020   HCT 44.0 06/16/2020   MCV 88.0 06/16/2020   PLT 216 06/16/2020      Component Value Date/Time   NA 141 06/16/2020 1555   NA 142 05/09/2018 1556   K 4.3 06/16/2020 1555   CL 106 06/16/2020 1555   CO2 24 06/16/2020 1555   GLUCOSE 108 06/16/2020 1555   BUN 23 06/16/2020 1555   BUN 30 (H) 05/09/2018 1556   CREATININE 1.02 (H) 06/16/2020 1555   CALCIUM 9.4 06/16/2020 1555   PROT 7.4 06/16/2020 1555   ALBUMIN 3.4 (L) 03/30/2020 2104    AST 18 06/16/2020 1555   ALT 16 06/16/2020 1555   ALKPHOS 109 03/30/2020 2104   BILITOT 0.3 06/16/2020 1555   GFRNONAA 51 (L) 06/16/2020 1555   GFRAA 59 (L) 06/16/2020 1555   Lab Results  Component Value Date   CHOL 126 01/13/2020   HDL 41 (L) 01/13/2020   LDLCALC 70 01/13/2020   TRIG 69 01/13/2020   CHOLHDL 3.1 01/13/2020   Lab Results  Component Value Date   HGBA1C 5.9 (A) 09/15/2020   Lab Results  Component Value Date   VITAMINB12 319 05/22/2017   Lab Results  Component Value Date   TSH 2.31 09/17/2019     ASSESSMENT AND PLAN 85 y.o. year old female  has a past medical history of Aortic insufficiency, Atrial fibrillation ([place]), CAD (coronary artery disease), CATARACTS, Chronic systolic heart failure ([place]), Complication of anesthesia (2009), GERD (gastroesophageal reflux disease), HYPERLIPIDEMIA, MILD, HYPERTENSION, BENIGN SYSTEMIC, Irritable bowel syndrome, LBBB (left bundle branch block), MEDIAL EPICONDYLITIS, NSVT (nonsustained ventricular tachycardia) (HCC), OSTEOARTHRITIS, OSTEOPENIA, PONV (postoperative nausea and vomiting), Pre-diabetes, RHINITIS, ALLERGIC, Stroke ([place]), SYNCOPE, and VITAMIN D DEFICIENCY. here with     ICD-10-CM   1. Lacunar stroke ([place])  I63.81   2. Complex sleep apnea syndrome  G47.31   3. Encounter for counseling on adaptive servo-ventilation (ASV) use  Z71.89      [place] continues to adjust to ASV therapy. Compliance report reveals acceptable daily usage but she has not been able to use for any extended period of time. There were 2 of the past 30 days  that she used it longer than 4 hours.  She has also had a difficult time with hearing air leaking from her mask.  I have educated her on appropriate mask seal and how to monitor for this on her machine.  I will send orders to [facility] today for reeducation.  She was encouraged to continue using ASV nightly and for greater than 4 hours each night.  She will start with small goals and build time  each week.  Risks of untreated sleep apnea review and education materials provided. Healthy lifestyle habits encouraged.  She will continue close follow-up with primary care for stroke prevention.  She will follow up in 6 weeks, sooner if needed.  She verbalizes understanding and agreement with this plan.   No orders of the defined types were placed in this encounter.    No orders of the defined types were placed in this encounter.     I spent 30 minutes with the patient. 50% of this time was spent counseling and educating patient on plan of care and medications.    Debbora Presto, FNP-C 10/20/2020, 3:16 PM [facility] [address] [place], [place] [address] [phone]  Made any corrections needed, and agree with history, physical, neuro exam,assessment and plan as stated.     Sarina Ill, MD [facility]

## 2020-10-20 NOTE — Patient Instructions (Addendum)
Please continue using your ASV regularly. While your insurance requires that you use ASV at least 4 hours each night on 70% of the nights, I recommend, that you not skip any nights and use it throughout the night if you can. Getting used to ASV and staying with the treatment long term does take time and patience and discipline. Untreated obstructive sleep apnea when it is moderate to severe can have an adverse impact on cardiovascular health and raise her risk for heart disease, arrhythmias, hypertension, congestive heart failure, stroke and diabetes. Untreated obstructive sleep apnea causes sleep disruption, nonrestorative sleep, and sleep deprivation. This can have an impact on your day to day functioning and cause daytime sleepiness and impairment of cognitive function, memory loss, mood disturbance, and problems focussing. Using ASV regularly can improve these symptoms.  We will continue working on usage. Start with small goals. Remember sleep hygiene. Try to put your mask on every night and set goals to increase time each week. Call [facility] for any concerns with mask or machine. Follow up with me in 6 weeks   Quality Sleep Information, Adult Quality sleep is important for your mental and physical health. It also improves your quality of life. Quality sleep means you:  Are asleep for most of the time you are in bed.  Fall asleep within 30 minutes.  Wake up no more than once a night.  Are awake for no longer than 20 minutes if you do wake up during the night. Most adults need 7-8 hours of quality sleep each night. How can poor sleep affect me? If you do not get enough quality sleep, you may have:  Mood swings.  Daytime sleepiness.  Confusion.  Decreased reaction time.  Sleep disorders, such as insomnia and sleep apnea.  Difficulty with: ? Solving problems. ? Coping with stress. ? Paying attention. These issues may affect your performance and productivity at work, school, and at  home. Lack of sleep may also put you at higher risk for accidents, suicide, and risky behaviors. If you do not get quality sleep you may also be at higher risk for several health problems, including:  Infections.  Type 2 diabetes.  Heart disease.  High blood pressure.  Obesity.  Worsening of long-term conditions, like arthritis, kidney disease, depression, Parkinson's disease, and epilepsy. What actions can I take to get more quality sleep?  Stick to a sleep schedule. Go to sleep and wake up at about the same time each day. Do not try to sleep less on weekdays and make up for lost sleep on weekends. This does not work.  Try to get about 30 minutes of exercise on most days. Do not exercise 2-3 hours before going to bed.  Limit naps during the day to 30 minutes or less.  Do not use any products that contain nicotine or tobacco, such as cigarettes or e-cigarettes. If you need help quitting, ask your health care provider.  Do not drink caffeinated beverages for at least 8 hours before going to bed. Coffee, tea, and some sodas contain caffeine.  Do not drink alcohol close to bedtime.  Do not eat large meals close to bedtime.  Do not take naps in the late afternoon.  Try to get at least 30 minutes of sunlight every day. Morning sunlight is best.  Make time to relax before bed. Reading, listening to music, or taking a hot bath promotes quality sleep.  Make your bedroom a place that promotes quality sleep. Keep your bedroom dark,  quiet, and at a comfortable room temperature. Make sure your bed is comfortable. Take out sleep distractions like TV, a computer, smartphone, and bright lights.  If you are lying awake in bed for longer than 20 minutes, get up and do a relaxing activity until you feel sleepy.  Work with your health care provider to treat medical conditions that may affect sleeping, such as: ? Nasal obstruction. ? Snoring. ? Sleep apnea and other sleep disorders.  Talk to  your health care provider if you think any of your prescription medicines may cause you to have difficulty falling or staying asleep.  If you have sleep problems, talk with a sleep consultant. If you think you have a sleep disorder, talk with your health care provider about getting evaluated by a specialist.      Where to find more information  National Sleep Foundation website: https://sleepfoundation.org  National Heart, Lung, and Blood Institute (NHLBI): https://hall.info/.pdf  Centers for Disease Control and Prevention (CDC): [employer]ail[employer].is Contact a health care provider if you:  Have trouble getting to sleep or staying asleep.  Often wake up very early in the morning and cannot get back to sleep.  Have daytime sleepiness.  Have daytime sleep attacks of suddenly falling asleep and sudden muscle weakness (narcolepsy).  Have a tingling sensation in your legs with a strong urge to move your legs (restless legs syndrome).  Stop breathing briefly during sleep (sleep apnea).  Think you have a sleep disorder or are taking a medicine that is affecting your quality of sleep. Summary  Most adults need 7-8 hours of quality sleep each night.  Getting enough quality sleep is an important part of health and well-being.  Make your bedroom a place that promotes quality sleep and avoid things that may cause you to have poor sleep, such as alcohol, caffeine, smoking, and large meals.  Talk to your health care provider if you have trouble falling asleep or staying asleep. This information is not intended to replace advice given to you by your health care provider. Make sure you discuss any questions you have with your health care provider. Document Revised: 01/01/2018 Document Reviewed: 01/01/2018 Elsevier Patient Education  2021 [employer]  Sleep Apnea Sleep apnea affects breathing during sleep. It causes breathing to stop for a short  time or to become shallow. It can also increase the risk of:  Heart attack.  Stroke.  Being very overweight (obese).  Diabetes.  Heart failure.  Irregular heartbeat. The goal of treatment is to help you breathe normally again. What are the causes? There are three kinds of sleep apnea:  Obstructive sleep apnea. This is caused by a blocked or collapsed airway.  Central sleep apnea. This happens when the brain does not send the right signals to the muscles that control breathing.  Mixed sleep apnea. This is a combination of obstructive and central sleep apnea. The most common cause of this condition is a collapsed or blocked airway. This can happen if:  Your throat muscles are too relaxed.  Your tongue and tonsils are too large.  You are overweight.  Your airway is too small.   What increases the risk?  Being overweight.  Smoking.  Having a small airway.  Being older.  Being female.  Drinking alcohol.  Taking medicines to calm yourself (sedatives or tranquilizers).  Having family members with the condition. What are the signs or symptoms?  Trouble staying asleep.  Being sleepy or tired during the day.  Getting angry a lot.  Loud snoring.  Headaches in the morning.  Not being able to focus your mind (concentrate).  Forgetting things.  Less interest in sex.  Mood swings.  Personality changes.  Feelings of sadness (depression).  Waking up a lot during the night to pee (urinate).  Dry mouth.  Sore throat. How is this diagnosed?  Your medical history.  A physical exam.  A test that is done when you are sleeping (sleep study). The test is most often done in a sleep lab but may also be done at home. How is this treated?  Sleeping on your side.  Using a medicine to get rid of mucus in your nose (decongestant).  Avoiding the use of alcohol, medicines to help you relax, or certain pain medicines (narcotics).  Losing weight, if  needed.  Changing your diet.  Not smoking.  Using a machine to open your airway while you sleep, such as: ? An oral appliance. This is a mouthpiece that shifts your lower jaw forward. ? A CPAP device. This device blows air through a mask when you breathe out (exhale). ? An EPAP device. This has valves that you put in each nostril. ? A BPAP device. This device blows air through a mask when you breathe in (inhale) and breathe out.  Having surgery if other treatments do not work. It is important to get treatment for sleep apnea. Without treatment, it can lead to:  High blood pressure.  Coronary artery disease.  In men, not being able to have an erection (impotence).  Reduced thinking ability.   Follow these instructions at home: Lifestyle  Make changes that your doctor recommends.  Eat a healthy diet.  Lose weight if needed.  Avoid alcohol, medicines to help you relax, and some pain medicines.  Do not use any products that contain nicotine or tobacco, such as cigarettes, e-cigarettes, and chewing tobacco. If you need help quitting, ask your doctor. General instructions  Take over-the-counter and prescription medicines only as told by your doctor.  If you were given a machine to use while you sleep, use it only as told by your doctor.  If you are having surgery, make sure to tell your doctor you have sleep apnea. You may need to bring your device with you.  Keep all follow-up visits as told by your doctor. This is important. Contact a doctor if:  The machine that you were given to use during sleep bothers you or does not seem to be working.  You do not get better.  You get worse. Get help right away if:  Your chest hurts.  You have trouble breathing in enough air.  You have an uncomfortable feeling in your back, arms, or stomach.  You have trouble talking.  One side of your body feels weak.  A part of your face is hanging down. These symptoms may be an  emergency. Do not wait to see if the symptoms will go away. Get medical help right away. Call your local emergency services (911 in the [place] Do not drive yourself to the hospital. Summary  This condition affects breathing during sleep.  The most common cause is a collapsed or blocked airway.  The goal of treatment is to help you breathe normally while you sleep. This information is not intended to replace advice given to you by your health care provider. Make sure you discuss any questions you have with your health care provider. Document Revised: 07/11/2018 Document Reviewed: 05/20/2018 Elsevier Patient Education  2021 [employer].

## 2020-10-26 NOTE — Progress Notes (Signed)
CM to [facility] for reeducation of mask and fit.

## 2020-11-09 ENCOUNTER — Other Ambulatory Visit: Payer: Self-pay | Admitting: Neurology

## 2020-11-09 ENCOUNTER — Other Ambulatory Visit: Payer: Self-pay | Admitting: Family Medicine

## 2020-11-09 DIAGNOSIS — K279 Peptic ulcer, site unspecified, unspecified as acute or chronic, without hemorrhage or perforation: Secondary | ICD-10-CM

## 2020-12-01 ENCOUNTER — Other Ambulatory Visit: Payer: Self-pay

## 2020-12-01 ENCOUNTER — Ambulatory Visit: Payer: Medicare Other | Admitting: Neurology

## 2020-12-01 ENCOUNTER — Other Ambulatory Visit: Payer: Self-pay | Admitting: Neurology

## 2020-12-01 ENCOUNTER — Encounter: Payer: Self-pay | Admitting: Neurology

## 2020-12-01 VITALS — BP 138/74 | HR 91 | Ht 63.0 in | Wt 298.0 lb

## 2020-12-01 DIAGNOSIS — Z7189 Other specified counseling: Secondary | ICD-10-CM | POA: Diagnosis not present

## 2020-12-01 DIAGNOSIS — G4731 Primary central sleep apnea: Secondary | ICD-10-CM

## 2020-12-01 NOTE — Progress Notes (Signed)
PATIENT: Charlotte [name] DOB: 07-08-1936  REASON FOR VISIT: follow up HISTORY FROM: patient  Chief Complaint  Patient presents with  . Follow-up    Pt with daughter, rm 29. Presents to follow up on ASV machine. DME [employer]. States overall getting better with the machine and feels better with using it. Daughter states that during the night they are still having some leaks that happen during the night     HISTORY OF PRESENT ILLNESS: Today 12/01/20 Charlotte [name] is a 85 y.o. female here for ASV follow up in 12-01-2020.   seen with her daughter, seated in a regular armchair.  The first sleep study was actually managed a home sleep test performed on September 1 and it showed that the patient had a severe sleep apnea with 67 events per hour and over 60 minutes of low oxygenation during this home sleep test.  There were 29 central apneas per hour The patient presented with excessive daytime sleepiness reflected and an Epworth sleepiness score of 18 points. There was a report of a dry mouth in the morning witnessed lo ud snoring and witnessed apneas by his spouse. The patient's regular sleep routine is as follows; the patient normally goes to bed around 11 and falls asleep within 10-15 minutes he is awoken 3 times at night and nocturia and is woken by his alarm clock at 6 in the morning. 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  Clinically relevant hypoxemia and atrial fibrillation congestive heart failure I has asked for a in lab titration which her insurance did not approve.  She was finally allowed to return on 06 July 2020 and was titrated to ASV as a final pressure of 15 over 10/4 cmH2O.  She had failed CPAP and BiPAP as well as the BiPAP ST function 2: None of them controlled central complex apnea the effective settings were 15 cm maximum pressure support 5 cm minimum pressure support and an EPAP expiratory pressure assistance pressure of 10 cmH2O.  She was given a [employer] full  flex mask and small size She could not use the [employer] interface well at home, her daughter here reports that she actually had to fly and bangs of her face so there was too much air leakage.  For this reason she changed to a traditional fullface mask that covers nose and mouth. The new mask was send to her after a phone conversation- NO FITTING!>   Her compliance download is excellent she has used the machine 29 out of 30 days and then 25 of these days over 4 hours with an average use at time of 6 hours 28 minutes.  The ASV has allowed for an apnea index of 0.1 there is still more shallow breathing spells, also known as hypopneas, at 10.9/h and the air leakage was still high according to this device.  I wonder if her Cheyne-Stokes at night and allows air to leak out.      Charlotte Presto, NP : today for follow up for recently diagnosed severe complex sleep apnea started on ASV therapy.  HST showed AHI 67/hr with 29 central events. She had hypoxemia for > 60 minutes. Titration study on 9/29 showed events were well controlled on ASV (Dreamwear FFM, small).  She reports that she is trying to adjust to therapy.  She had a very difficult time in the beginning with air leaking from the Dream[employer]ear mask. She has recently changed to a different fullface mask  that covers the bridge of her nose.  She feels that she is doing better with this mask.  She does continue to hear air that prevents her from being able to go to sleep.  Compliance report dated 09/19/2020 through 10/18/2020 reveals that she used ASV 22 of the past 30 days for compliance of 73%.  She used ASV greater than 4 hours to of the past 30 days for compliance of 7%.  Average usage on days used was 1 hour and 27 minutes.  Residual AHI was 8.2 with a EPAP of 10 cm of water and pressure support of 4-15.  There was a significant leak noted in the 95th percentile of 37.1 L/min.   She was last seen by Dr Charlotte [name] for stroke follow up in 03/2020. She continues Xarelto.  She was referred to GI for gastric bleeds but never seen. She has been doing well on pantoprazole. She also continues atorvastatin 52m.    HISTORY: (copied from previous note)  PBRYLEA PITAa 85year- old Caucasian female  Stroke patientand seen here upon referralon 05/12/2020 from Dr AJaynee [name]  Chiefconcernaccording to patient :  Mrs. Charlotte Combesreports excessive daytime sleepiness being more sleepy than she usually used to be.  She has very frequent nocturia and this is not a new issue but may have been present for 2-3 or even 4 years.  Multiple bathroom breaks means up to 7 nocturia's.  She also has reached a stage of physical deconditioning that makes it harder for her to not get short of breath while she walks or performs rather simple tasks.  She is using a walker for ambulation, but she reports that she sleeps because of orthopnea better than her back is elevated so a recliner is more conducive to sleep than her bed. Family members have not heard her snore and there is a good chance that this patient has central apnea due to her congestive heart failure history associated with atrial fibrillation.  She also may be much more unrefreshed because of anemia and chronic blood loss in the setting of chronic anticoagulation.  She had suffered a GI bleed in the past.  There was no alternative to resuming at least some level of anticoagulation safely.  Remains on Xarelto and  No longer on ASA.   I have the pleasure of seeing Charlotte [name] today,a right -handed White or Caucasian female with a possible sleep disorder. She has a  has a past medical history of Aortic insufficiency, Atrial fibrillation ([place], CAD (coronary artery disease), CATARACTS, Chronic systolic heart failure ([place], Complication of anesthesia (2009), GERD (gastroesophageal reflux disease), HYPERLIPIDEMIA, MILD, HYPERTENSION, BENIGN SYSTEMIC, Irritable bowel syndrome, LBBB (left bundle branch block), MEDIAL EPICONDYLITIS, NSVT  (nonsustained ventricular tachycardia) (HCC), OSTEOARTHRITIS, OSTEOPENIA, PONV (postoperative nausea and vomiting), Pre-diabetes, RHINITIS, ALLERGIC, Stroke ([place], SYNCOPE, and VITAMIN D DEFICIENCY.  Social history:Patient is retired from s[employer] and lives in a household with alone.  Family status is widowed  with adult children, her daughter is her neighbour.   She likes puzzles and sudoko. No longer reads.  Pets are not present. Tobacco use "not in 50 years ". ETOH use ;none ,  Caffeine intake in form of Coffee( some) Soda( /) Tea ( some) or energy drinks.  Sleep habits are as follows:The patient's dinner time is between 6 PM.  The patient goes to bed at 10.30 PM and continues to sleep for one hour at a time , fragmented  hourly, wakes for nocturia- bathroom  breaks. The preferred sleep position is in bed in a lateral or supine position, but she changes soon to the recliner-  About 4-5 AM.  9 AM is the usual rise time. The patient wakes up spontaneously.  She reports not feeling refreshed or restored in AM, with symptoms such as dry mouth and residual fatigue.  Naps are taken frequently,  She will doze off - lasting from 20-60 minutes and are more refreshing than nocturnal sleep.   REVIEW OF SYSTEMS: Out of a complete 14 system review of symptoms, the patient complains only of the following symptoms, fatigue, shob, insomnia and all other reviewed systems are negative.  How likely are you to doze in the following situations: 0 = not likely, 1 = slight chance, 2 = moderate chance, 3 = high chance  Sitting and Reading? Watching Television? Sitting inactive in a public place (theater or meeting)? Lying down in the afternoon when circumstances permit? Sitting and talking to someone? Sitting quietly after lunch without alcohol? In a car, while stopped for a few minutes in traffic? As a passenger in a car for an hour without a break?  Total =10 FSS at 40.      ALLERGIES: Allergies  Allergen Reactions  . Metformin And Related Other (See Comments)    Renal failure  . Amlodipine Other (See Comments)    HEADACHE    HOME MEDICATIONS: Outpatient Medications Prior to Visit  Medication Sig Dispense Refill  . acetaminophen (TYLENOL) 500 MG tablet Take 1,000 mg by mouth 3 (three) times daily.    Marland Kitchen atorvastatin (LIPITOR) 40 MG tablet Take 1 tablet (40 mg total) by mouth daily. 90 tablet 6  . carvedilol (COREG) 12.5 MG tablet TAKE 1 TABLET BY MOUTH TWICE A DAY WITH A MEAL 90 tablet 3  . Ferrous Sulfate (IRON PO) Take 1 tablet by mouth every other day.    . furosemide (LASIX) 20 MG tablet TAKE 1 TABLET BY MOUTH TWICE A DAY 60 tablet 4  . losartan (COZAAR) 25 MG tablet TAKE 1 TABLET BY MOUTH EVERY DAY 90 tablet 3  . omeprazole (PRILOSEC) 40 MG capsule TAKE 1 CAPSULE (40 MG TOTAL) BY MOUTH IN THE MORNING AND AT BEDTIME. 180 capsule 0  . polyvinyl alcohol-povidone (HYPOTEARS) 1.4-0.6 % ophthalmic solution Place 1-2 drops into both eyes daily as needed (dry eyes).     . potassium chloride (KLOR-CON) 10 MEQ tablet TAKE 1 TABLET (10 MEQ TOTAL) BY MOUTH 2 (TWO) TIMES DAILY. 180 tablet 3  . rivaroxaban (XARELTO) 20 MG TABS tablet Take 1 tablet (20 mg total) by mouth daily with supper. 90 tablet 3   No facility-administered medications prior to visit.    PAST MEDICAL HISTORY: Past Medical History:  Diagnosis Date  . Aortic insufficiency   . Atrial fibrillation ([place])    a. s/p DCCV 03/2013  . CAD (coronary artery disease)    LHC 06/2010: EF 55%, mild plaque in the LAD 20-30%, otherwise normal coronary arteries  . CATARACTS   . Chronic systolic heart failure ([place])    in setting of AFib;  Echocardiogram 02/2013: Moderate LVH, EF 25-30%, anteroseptal and apical HK, moderate AI, MAC, moderate MR, moderate LAE, mild RAE, PASP 32 => after DCCV f/u Echo 8/14: Moderate LVH, EF 60-65%, normal wall motion, grade 1 diastolic dysfunction, moderate AI, moderate LAE   . Complication of anesthesia 2009   nausea and vomitting  . GERD (gastroesophageal reflux disease)   . HYPERLIPIDEMIA, MILD   . HYPERTENSION, BENIGN SYSTEMIC   .  Irritable bowel syndrome   . LBBB (left bundle branch block)   . MEDIAL EPICONDYLITIS   . NSVT (nonsustained ventricular tachycardia) ([place])    during Dob Echo 2011 => normal cath  . OSTEOARTHRITIS   . OSTEOPENIA   . PONV (postoperative nausea and vomiting)   . Pre-diabetes   . RHINITIS, ALLERGIC   . Stroke ([place])   . SYNCOPE   . VITAMIN D DEFICIENCY     PAST SURGICAL HISTORY: Past Surgical History:  Procedure Laterality Date  . BIOPSY  03/01/2020   Procedure: BIOPSY;  Surgeon: Otis Brace, MD;  Location: [place];  Service: Gastroenterology;;  . CARDIOVERSION N/A 04/01/2013   Procedure: CARDIOVERSION;  Surgeon: Josue Hector, MD;  Location[facility] ENDOSCOPY;  Service: Cardiovascular;  Laterality: N/A;  . CARDIOVERSION N/A 07/16/2017   Procedure: CARDIOVERSION;  Surgeon: Pixie Casino, MD;  Location[facility] ENDOSCOPY;  Service: Cardiovascular;  Laterality: N/A;  . CARDIOVERSION N/A 07/07/2018   Procedure: CARDIOVERSION;  Surgeon: Pixie Casino, MD;  Location: [place];  Service: Cardiovascular;  Laterality: N/A;  . CATARACT EXTRACTION, BILATERAL  2012   Dr. Herbert Deaner  . COLONOSCOPY WITH PROPOFOL N/A 05/23/2017   Procedure: COLONOSCOPY WITH PROPOFOL;  Surgeon: Wonda Horner, MD;  Location[facility] ENDOSCOPY;  Service: Endoscopy;  Laterality: N/A;  . ESOPHAGOGASTRODUODENOSCOPY N/A 03/01/2020   Procedure: ESOPHAGOGASTRODUODENOSCOPY (EGD);  Surgeon: Otis Brace, MD;  Location: [facility] ENDOSCOPY;  Service: Gastroenterology;  Laterality: N/A;  . ESOPHAGOGASTRODUODENOSCOPY (EGD) WITH PROPOFOL N/A 05/23/2017   Procedure: ESOPHAGOGASTRODUODENOSCOPY (EGD) WITH PROPOFOL;  Surgeon: Wonda Horner, MD;  Location: [facility] ENDOSCOPY;  Service: Endoscopy;  Laterality: N/A;  . IR KYPHO LUMBAR INC FX REDUCE BONE BX UNI/BIL CANNULATION  INC/IMAGING  06/06/2017  . IR RADIOLOGIST EVAL & MGMT  07/01/2017  . RADICAL HYSTERECTOMY    . REPLACEMENT TOTAL KNEE     11/03/07 right....... 03/29/08 left    FAMILY HISTORY: Family History  Problem Relation Age of Onset  . Arrhythmia Mother        afib  . Alzheimer's disease Mother   . Stroke Mother   . Colon cancer Father   . Heart attack Father 46  . Diabetes Sister   . Arrhythmia Sister        afib  . Stroke Maternal Grandmother     SOCIAL HISTORY: Social History   Socioeconomic History  . Marital status: Divorced    Spouse name: Not on file  . Number of children: 1  . Years of education: Not on file  . Highest education level: High school graduate  Occupational History  . Occupation: Retired    Fish farm manager: RETIRED  Tobacco Use  . Smoking status: Former Smoker    Quit date: 10/08/1970    Years since quitting: 50.1  . Smokeless tobacco: Never Used  Vaping Use  . Vaping Use: Never used  Substance and Sexual Activity  . Alcohol use: No  . Drug use: No  . Sexual activity: Not on file  Other Topics Concern  . Not on file  Social History Narrative   Lives by herself at home   Right handed   Caffeine: 1 cup/day at the most. Recently switched to decaf coffee.   Social Determinants of Health   Financial Resource Strain: Not on file  Food Insecurity: Not on file  Transportation Needs: Not on file  Physical Activity: Not on file  Stress: Not on file  Social Connections: Not on file  Intimate Partner Violence: Not on file  PHYSICAL EXAM  Vitals:   12/01/20 1553  BP: 138/74  Pulse: 91  Weight: 298 lb (135.2 kg)  Height: _0  (1.6 m)   Body mass index is 52.79 kg/m.  Generalized: Well developed, in no acute distress  Cardiology: normal rate and rhythm, no murmur noted Respiratory: clear to auscultation bilaterally  Neurological examination  Mentation: Alert oriented to time, place, history taking. Follows all commands speech and language  fluent Cranial nerve II-XII: Pupils were equal round reactive to light. Extraocular movements were full, visual field were full  Motor: The motor testing reveals 5 over 5 strength of all 4 extremities. Good symmetric motor tone is noted throughout.  Gait and station: Gait is stable with Rollator   DIAGNOSTIC DATA (LABS, IMAGING, TESTING) - I reviewed patient records, labs, notes, testing and imaging myself where available.  No flowsheet data found.   Lab Results  Component Value Date   WBC 6.4 06/16/2020   HGB 14.2 06/16/2020   HCT 44.0 06/16/2020   MCV 88.0 06/16/2020   PLT 216 06/16/2020      Component Value Date/Time   NA 141 06/16/2020 1555   NA 142 05/09/2018 1556   K 4.3 06/16/2020 1555   CL 106 06/16/2020 1555   CO2 24 06/16/2020 1555   GLUCOSE 108 06/16/2020 1555   BUN 23 06/16/2020 1555   BUN 30 (H) 05/09/2018 1556   CREATININE 1.02 (H) 06/16/2020 1555   CALCIUM 9.4 06/16/2020 1555   PROT 7.4 06/16/2020 1555   ALBUMIN 3.4 (L) 03/30/2020 2104   AST 18 06/16/2020 1555   ALT 16 06/16/2020 1555   ALKPHOS 109 03/30/2020 2104   BILITOT 0.3 06/16/2020 1555   GFRNONAA 51 (L) 06/16/2020 1555   GFRAA 59 (L) 06/16/2020 1555   Lab Results  Component Value Date   CHOL 126 01/13/2020   HDL 41 (L) 01/13/2020   LDLCALC 70 01/13/2020   TRIG 69 01/13/2020   CHOLHDL 3.1 01/13/2020   Lab Results  Component Value Date   HGBA1C 5.9 (A) 09/15/2020   Lab Results  Component Value Date   VITAMINB12 319 05/22/2017   Lab Results  Component Value Date   TSH 2.31 09/17/2019     ASSESSMENT AND PLAN 85 y.o. year old female  has a past medical history of Aortic insufficiency, Atrial fibrillation ([place]), CAD (coronary artery disease), CATARACTS, Chronic systolic heart failure ([place]), Complication of anesthesia (2009), GERD (gastroesophageal reflux disease), HYPERLIPIDEMIA, MILD, HYPERTENSION, BENIGN SYSTEMIC, Irritable bowel syndrome, LBBB (left bundle branch block), MEDIAL  EPICONDYLITIS, NSVT (nonsustained ventricular tachycardia) (HCC), OSTEOARTHRITIS, OSTEOPENIA, PONV (postoperative nausea and vomiting), Pre-diabetes, RHINITIS, ALLERGIC, Stroke ([place]), SYNCOPE, and VITAMIN D DEFICIENCY. here with   No diagnosis found.   Charlotte [name] continues to adjust to ASV therapy. Compliance report shows good compliance- just high airleaks persists and neither mask nor machine were present at this visit.   I had fitted the patient today with a large RESMED AIRFIT F10 in reclined position.   Risks of untreated sleep apnea review and education materials provided.  Healthy lifestyle habits encouraged.  She will continue close follow-up with primary care for stroke prevention.  She will follow up in 6 weeks, sooner if needed.  She verbalizes understanding and agreement with this plan.    I spent 30 minutes with the patient. 50% of this time was spent counseling and educating patient on plan of care and medications.    C. Dohmeier,MD  12/01/2020, 4:13 PM [facility] [address], [place], [place] [address] [phone]  Made any corrections needed, and agree with history, physical, neuro exam,assessment and plan as stated.   Sarina Ill, MD [facility]

## 2020-12-04 ENCOUNTER — Other Ambulatory Visit: Payer: Self-pay | Admitting: Cardiology

## 2020-12-05 ENCOUNTER — Other Ambulatory Visit: Payer: Self-pay | Admitting: Family Medicine

## 2020-12-05 ENCOUNTER — Other Ambulatory Visit: Payer: Self-pay | Admitting: Neurology

## 2020-12-05 ENCOUNTER — Other Ambulatory Visit: Payer: Self-pay | Admitting: Cardiology

## 2020-12-05 DIAGNOSIS — K279 Peptic ulcer, site unspecified, unspecified as acute or chronic, without hemorrhage or perforation: Secondary | ICD-10-CM

## 2020-12-06 NOTE — Telephone Encounter (Signed)
30f, 135.2kg, scr 1.02 (06/16/20), ccr 89, lovw/crenshaw (06/08/20). Pt requesting 20mg  refill for xarelto and the pt qualifies refill sent

## 2020-12-26 ENCOUNTER — Other Ambulatory Visit: Payer: Self-pay | Admitting: Cardiology

## 2020-12-26 DIAGNOSIS — I4819 Other persistent atrial fibrillation: Secondary | ICD-10-CM

## 2020-12-28 ENCOUNTER — Ambulatory Visit: Payer: Medicare HMO | Admitting: Cardiology

## 2021-01-04 ENCOUNTER — Telehealth: Payer: Self-pay | Admitting: Neurology

## 2021-01-04 NOTE — Telephone Encounter (Signed)
Pt's daughter, Bunnie Rehberg (on [place]) called, trying to help mother with her CPAP compliance issue. She has gotten a bill for non-compliant $600.  Mask was not fitted properly. Spoke with [employer] yesterday, they suggested getting another sleep study and starting over.  Would like a call from the nurse.

## 2021-01-04 NOTE — Telephone Encounter (Signed)
Contacted the manager at [employer] because since the patient's visit here in the office she has been really close to being compliant.  Advised the manager that we were told at the visit that the daughter attempted to reach out to a referral for mask refit and was advised to follow-up at the doctor's office visit.  And if that is the case then they did not help with getting compliance initially. According to the manager," 17 days into the patient using the device and RT reached out for education and answered questions. They did a 30-day mask swap late November and their sleep coaches continue to reach out and encouraged more usage and gave tips on how to achieve.  On 09/23/2020 patient stated she feels like she is getting better.  60-day mark they reached out and they never heard back from the patient/daghter." The manager at [employer] advised me that the daughter told them yesterday the patient was starting new insurance, he checked with the billing department and since she is starting new insurance they will bill through the new insurance and she will have another 90 days to become compliant.  The manager advised he would reach out to the patient.  He called at 3:31 PM and left a voicemail with the daughter.  **Daughter calls back, please advised to reach back out to [employer]- [phone] with any billing questions.  Please advise the manager informed me that she will not need to repeat everything over at this time.  As long as she remains compliant for the first 90 days with a new machine, meaning using the machine > 4 hrs each night there should be any issues.

## 2021-01-10 ENCOUNTER — Ambulatory Visit: Payer: Medicare Other | Admitting: Family Medicine

## 2021-01-10 DIAGNOSIS — G4731 Primary central sleep apnea: Secondary | ICD-10-CM | POA: Diagnosis not present

## 2021-01-17 ENCOUNTER — Ambulatory Visit: Payer: Medicare HMO | Admitting: Family Medicine

## 2021-01-18 DIAGNOSIS — G4731 Primary central sleep apnea: Secondary | ICD-10-CM | POA: Diagnosis not present

## 2021-01-31 ENCOUNTER — Ambulatory Visit (INDEPENDENT_AMBULATORY_CARE_PROVIDER_SITE_OTHER): Payer: Medicare HMO | Admitting: Family Medicine

## 2021-01-31 ENCOUNTER — Encounter: Payer: Self-pay | Admitting: Family Medicine

## 2021-01-31 ENCOUNTER — Ambulatory Visit (INDEPENDENT_AMBULATORY_CARE_PROVIDER_SITE_OTHER): Payer: Medicare HMO

## 2021-01-31 ENCOUNTER — Other Ambulatory Visit: Payer: Self-pay

## 2021-01-31 VITALS — BP 130/68 | HR 91 | Ht 63.0 in | Wt 297.0 lb

## 2021-01-31 DIAGNOSIS — R3912 Poor urinary stream: Secondary | ICD-10-CM

## 2021-01-31 DIAGNOSIS — E669 Obesity, unspecified: Secondary | ICD-10-CM | POA: Diagnosis not present

## 2021-01-31 DIAGNOSIS — M545 Low back pain, unspecified: Secondary | ICD-10-CM

## 2021-01-31 DIAGNOSIS — E1169 Type 2 diabetes mellitus with other specified complication: Secondary | ICD-10-CM

## 2021-01-31 DIAGNOSIS — I10 Essential (primary) hypertension: Secondary | ICD-10-CM | POA: Diagnosis not present

## 2021-01-31 LAB — POCT URINALYSIS DIP (CLINITEK)
Bilirubin, UA: NEGATIVE
Blood, UA: NEGATIVE
Glucose, UA: NEGATIVE mg/dL
Ketones, POC UA: NEGATIVE mg/dL
Nitrite, UA: NEGATIVE
POC PROTEIN,UA: NEGATIVE
Spec Grav, UA: 1.015 (ref 1.010–1.025)
Urobilinogen, UA: 0.2 E.U./dL
pH, UA: 5.5 (ref 5.0–8.0)

## 2021-01-31 LAB — POCT GLYCOSYLATED HEMOGLOBIN (HGB A1C): Hemoglobin A1C: 6 % — AB (ref 4.0–5.6)

## 2021-01-31 NOTE — Progress Notes (Signed)
Established Patient Office Visit  Subjective:  Patient ID: Charlotte [name], female    DOB: 1936/07/15  Age: 85 y.o. MRN: [id number]  CC:  Chief Complaint  Patient presents with  . Diabetes    HPI [name] presents for   Diabetes - no hypoglycemic events. No wounds or sores that are not healing well. No increased thirst or urination. Checking glucose at home.   Want to discuss her bladder symptoms she has been noticing for a couple of months that her urinary stream is a little weaker.  She does [place]eman adult diapers and has some incontinence but sometimes she is noticing that she will feel like she needs to go but by the time she gets to the bathroom she cannot really go.  She is not having any dysuria or hematuria.  She also complains of low back pain that is been more bothersome than usual even though she had some problems on and off since 2018 when she had a kyphoplasty for a lumbar fracture.  She has not tried heat or ice she does take Tylenol twice a day routinely.  No radiation of the pain into her extremities.  She usually just sits and rests it because of the discomfort.   Past Medical History:  Diagnosis Date  . Aortic insufficiency   . Atrial fibrillation ([place])    a. s/p DCCV 03/2013  . CAD (coronary artery disease)    LHC 06/2010: EF 55%, mild plaque in the LAD 20-30%, otherwise normal coronary arteries  . CATARACTS   . Chronic systolic heart failure ([place])    in setting of AFib;  Echocardiogram 02/2013: Moderate LVH, EF 25-30%, anteroseptal and apical HK, moderate AI, MAC, moderate MR, moderate LAE, mild RAE, PASP 32 => after DCCV f/u Echo 8/14: Moderate LVH, EF 60-65%, normal wall motion, grade 1 diastolic dysfunction, moderate AI, moderate LAE  . Complication of anesthesia 2009   nausea and vomitting  . GERD (gastroesophageal reflux disease)   . HYPERLIPIDEMIA, MILD   . HYPERTENSION, BENIGN SYSTEMIC   . Irritable bowel syndrome   . LBBB (left bundle branch block)   .  MEDIAL EPICONDYLITIS   . NSVT (nonsustained ventricular tachycardia) ([place])    during Dob Echo 2011 => normal cath  . OSTEOARTHRITIS   . OSTEOPENIA   . PONV (postoperative nausea and vomiting)   . Pre-diabetes   . RHINITIS, ALLERGIC   . Stroke ([place])   . SYNCOPE   . VITAMIN D DEFICIENCY     Past Surgical History:  Procedure Laterality Date  . BIOPSY  03/01/2020   Procedure: BIOPSY;  Surgeon: Otis Brace, MD;  Location: [place];  Service: Gastroenterology;;  . CARDIOVERSION N/A 04/01/2013   Procedure: CARDIOVERSION;  Surgeon: Josue Hector, MD;  Location: [facility] ENDOSCOPY;  Service: Cardiovascular;  Laterality: N/A;  . CARDIOVERSION N/A 07/16/2017   Procedure: CARDIOVERSION;  Surgeon: Pixie Casino, MD;  Location: [facility] ENDOSCOPY;  Service: Cardiovascular;  Laterality: N/A;  . CARDIOVERSION N/A 07/07/2018   Procedure: CARDIOVERSION;  Surgeon: Pixie Casino, MD;  Location: [place];  Service: Cardiovascular;  Laterality: N/A;  . CATARACT EXTRACTION, BILATERAL  2012   Dr. Herbert Deaner  . COLONOSCOPY WITH PROPOFOL N/A 05/23/2017   Procedure: COLONOSCOPY WITH PROPOFOL;  Surgeon: Wonda Horner, MD;  Location: [facility] ENDOSCOPY;  Service: Endoscopy;  Laterality: N/A;  . ESOPHAGOGASTRODUODENOSCOPY N/A 03/01/2020   Procedure: ESOPHAGOGASTRODUODENOSCOPY (EGD);  Surgeon: Otis Brace, MD;  Location: [facility] ENDOSCOPY;  Service: Gastroenterology;  Laterality: N/A;  .  ESOPHAGOGASTRODUODENOSCOPY (EGD) WITH PROPOFOL N/A 05/23/2017   Procedure: ESOPHAGOGASTRODUODENOSCOPY (EGD) WITH PROPOFOL;  Surgeon: Wonda Horner, MD;  Location: [facility] ENDOSCOPY;  Service: Endoscopy;  Laterality: N/A;  . IR KYPHO LUMBAR INC FX REDUCE BONE BX UNI/BIL CANNULATION INC/IMAGING  06/06/2017  . IR RADIOLOGIST EVAL & MGMT  07/01/2017  . RADICAL HYSTERECTOMY    . REPLACEMENT TOTAL KNEE     11/03/07 right....... 03/29/08 left    Family History  Problem Relation Age of Onset  . Arrhythmia Mother        afib  . Alzheimer's  disease Mother   . Stroke Mother   . Colon cancer Father   . Heart attack Father 19  . Diabetes Sister   . Arrhythmia Sister        afib  . Stroke Maternal Grandmother     Social History   Socioeconomic History  . Marital status: Divorced    Spouse name: Not on file  . Number of children: 1  . Years of education: Not on file  . Highest education level: High school graduate  Occupational History  . Occupation: Retired    Fish farm manager: RETIRED  Tobacco Use  . Smoking status: Former Smoker    Quit date: 10/08/1970    Years since quitting: 50.3  . Smokeless tobacco: Never Used  Vaping Use  . Vaping Use: Never used  Substance and Sexual Activity  . Alcohol use: No  . Drug use: No  . Sexual activity: Not on file  Other Topics Concern  . Not on file  Social History Narrative   Lives by herself at home   Right handed   Caffeine: 1 cup/day at the most. Recently switched to decaf coffee.   Social Determinants of Health   Financial Resource Strain: Not on file  Food Insecurity: Not on file  Transportation Needs: Not on file  Physical Activity: Not on file  Stress: Not on file  Social Connections: Not on file  Intimate Partner Violence: Not on file    Outpatient Medications Prior to Visit  Medication Sig Dispense Refill  . acetaminophen (TYLENOL) 500 MG tablet Take 1,000 mg by mouth 3 (three) times daily.    Marland Kitchen atorvastatin (LIPITOR) 40 MG tablet TAKE 1 TABLET BY MOUTH EVERY DAY 90 tablet 6  . carvedilol (COREG) 12.5 MG tablet TAKE 1 TABLET BY MOUTH TWICE A DAY WITH MEALS 90 tablet 3  . Ferrous Sulfate (IRON PO) Take 1 tablet by mouth every other day.    . furosemide (LASIX) 20 MG tablet TAKE 1 TABLET BY MOUTH TWICE A DAY 60 tablet 3  . losartan (COZAAR) 25 MG tablet TAKE 1 TABLET BY MOUTH EVERY DAY 90 tablet 3  . polyvinyl alcohol-povidone (HYPOTEARS) 1.4-0.6 % ophthalmic solution Place 1-2 drops into both eyes daily as needed (dry eyes).     . potassium chloride (KLOR-CON)  10 MEQ tablet TAKE 1 TABLET (10 MEQ TOTAL) BY MOUTH 2 (TWO) TIMES DAILY. 180 tablet 3  . XARELTO 20 MG TABS tablet TAKE 1 TABLET (20 MG TOTAL) BY MOUTH DAILY WITH SUPPER. 90 tablet 1  . omeprazole (PRILOSEC) 40 MG capsule TAKE 1 CAPSULE (40 MG TOTAL) BY MOUTH IN THE MORNING AND AT BEDTIME. 180 capsule 0   No facility-administered medications prior to visit.    Allergies  Allergen Reactions  . Metformin And Related Other (See Comments)    Renal failure  . Amlodipine Other (See Comments)    HEADACHE    ROS Review of Systems  Objective:    Physical Exam Constitutional:      Appearance: She is well-developed.  HENT:     Head: Normocephalic and atraumatic.  Cardiovascular:     Rate and Rhythm: Normal rate and regular rhythm.     Heart sounds: Normal heart sounds.  Pulmonary:     Effort: Pulmonary effort is normal.     Breath sounds: Normal breath sounds.  Skin:    General: Skin is warm and dry.  Neurological:     Mental Status: She is alert and oriented to person, place, and time.  Psychiatric:        Behavior: Behavior normal.     BP 130/68   Pulse 91   Ht _0  (1.6 m)   Wt 297 lb (134.7 kg)   SpO2 97%   BMI 52.61 kg/m  Wt Readings from Last 3 Encounters:  01/31/21 297 lb (134.7 kg)  12/01/20 298 lb (135.2 kg)  10/20/20 296 lb 6.4 oz (134.4 kg)     Health Maintenance Due  Topic Date Due  . TETANUS/TDAP  07/26/2019  . OPHTHALMOLOGY EXAM  01/12/2021    There are no preventive care reminders to display for this patient.  Lab Results  Component Value Date   TSH 2.31 09/17/2019   Lab Results  Component Value Date   WBC 6.4 06/16/2020   HGB 14.2 06/16/2020   HCT 44.0 06/16/2020   MCV 88.0 06/16/2020   PLT 216 06/16/2020   Lab Results  Component Value Date   NA 141 06/16/2020   K 4.3 06/16/2020   CO2 24 06/16/2020   GLUCOSE 108 06/16/2020   BUN 23 06/16/2020   CREATININE 1.02 (H) 06/16/2020   BILITOT 0.3 06/16/2020   ALKPHOS 109 03/30/2020    AST 18 06/16/2020   ALT 16 06/16/2020   PROT 7.4 06/16/2020   ALBUMIN 3.4 (L) 03/30/2020   CALCIUM 9.4 06/16/2020   ANIONGAP 9 03/30/2020   GFR 63.15 05/22/2013   Lab Results  Component Value Date   CHOL 126 01/13/2020   Lab Results  Component Value Date   HDL 41 (L) 01/13/2020   Lab Results  Component Value Date   LDLCALC 70 01/13/2020   Lab Results  Component Value Date   TRIG 69 01/13/2020   Lab Results  Component Value Date   CHOLHDL 3.1 01/13/2020   Lab Results  Component Value Date   HGBA1C 6.0 (A) 01/31/2021      Assessment & Plan:   Problem List Items Addressed This Visit      Cardiovascular and Mediastinum   Essential hypertension    Well controlled. Continue current regimen. Follow up in  6 mo      Relevant Orders   BASIC METABOLIC PANEL WITH GFR   Lipid panel     Endocrine   Diabetes mellitus type 2 in obese (HCC) - Primary    A1c looks good today at 6.0.  Up slightly from 5.9.  Continue current regimen including ARB and statin.      Relevant Orders   POCT glycosylated hemoglobin (Hb A1C) (Completed)   BASIC METABOLIC PANEL WITH GFR   Lipid panel     Other   Low back pain without sciatica    Low back pain has flared more recently.  We did discuss getting an x-ray since she does have a prior history of compression fracture and we also discussed making sure that she is getting up and moving more regularly and not just sitting for  prolonged periods which can actually worsen back pain I think she be a great candidate for physical therapy.  Will call with results once available.  In the meantime recommend a trial of heat and/or ice to see whichever feels better.  Okay to continue her Tylenol as needed.      Relevant Orders   DG Lumbar Spine Complete    Other Visit Diagnoses    Weak urinary stream       Relevant Orders   POCT URINALYSIS DIP (CLINITEK) (Completed)     Weak urinary stream  -we discussed potential diagnoses such as urethral  stricture or obstruction.  Though her daughter noted that she does tend to [place] an adult diaper and a lot of times is actually quite full she suspects that she is actually probably leaking significantly before she actually gets to the toilet and by the time she gets there the urinary stream is sort of at the end and its not as forceful or powerful.  Urinalysis today was normal no sign of protein blood or infection.  We did discuss the possibility of referral to urology if she feels like her symptoms are progressing.  No orders of the defined types were placed in this encounter.   Follow-up: Return in about 4 months (around 06/02/2021) for Diabetes follow-up.    Beatrice Lecher, MD

## 2021-01-31 NOTE — Assessment & Plan Note (Signed)
Low back pain has flared more recently.  We did discuss getting an x-ray since she does have a prior history of compression fracture and we also discussed making sure that she is getting up and moving more regularly and not just sitting for prolonged periods which can actually worsen back pain I think she be a great candidate for physical therapy.  Will call with results once available.  In the meantime recommend a trial of heat and/or ice to see whichever feels better.  Okay to continue her Tylenol as needed.

## 2021-01-31 NOTE — Assessment & Plan Note (Signed)
Well controlled. Continue current regimen. Follow up in  6 mo  

## 2021-01-31 NOTE — Assessment & Plan Note (Signed)
A1c looks good today at 6.0.  Up slightly from 5.9.  Continue current regimen including ARB and statin.

## 2021-02-01 LAB — LIPID PANEL
Cholesterol: 131 mg/dL (ref <200)
HDL: 47 mg/dL — ABNORMAL LOW (ref >=50)
LDL Cholesterol (Calc): 67 mg/dL (calc)
Non-HDL Cholesterol (Calc): 84 mg/dL (calc) (ref <130)
Total CHOL/HDL Ratio: 2.8 (calc) (ref <5.0)
Triglycerides: 90 mg/dL (ref <150)

## 2021-02-01 LAB — BASIC METABOLIC PANEL WITH GFR
BUN: 21 mg/dL (ref 7–25)
CO2: 24 mmol/L (ref 20–32)
Calcium: 9.5 mg/dL (ref 8.6–10.4)
Chloride: 104 mmol/L (ref 98–110)
Creat: 0.88 mg/dL (ref 0.60–0.88)
GFR, Est African American: 70 mL/min/{1.73_m2} (ref >=60)
GFR, Est Non African American: 61 mL/min/{1.73_m2} (ref >=60)
Glucose, Bld: 97 mg/dL (ref 65–99)
Potassium: 4.3 mmol/L (ref 3.5–5.3)
Sodium: 139 mmol/L (ref 135–146)

## 2021-02-08 ENCOUNTER — Other Ambulatory Visit: Payer: Self-pay | Admitting: Cardiology

## 2021-02-09 DIAGNOSIS — G4731 Primary central sleep apnea: Secondary | ICD-10-CM | POA: Diagnosis not present

## 2021-02-13 DIAGNOSIS — D649 Anemia, unspecified: Secondary | ICD-10-CM | POA: Diagnosis not present

## 2021-02-13 DIAGNOSIS — I110 Hypertensive heart disease with heart failure: Secondary | ICD-10-CM | POA: Diagnosis not present

## 2021-02-13 DIAGNOSIS — E1142 Type 2 diabetes mellitus with diabetic polyneuropathy: Secondary | ICD-10-CM | POA: Diagnosis not present

## 2021-02-13 DIAGNOSIS — Z6841 Body Mass Index (BMI) 40.0 and over, adult: Secondary | ICD-10-CM | POA: Diagnosis not present

## 2021-02-13 DIAGNOSIS — I429 Cardiomyopathy, unspecified: Secondary | ICD-10-CM | POA: Diagnosis not present

## 2021-02-13 DIAGNOSIS — I4891 Unspecified atrial fibrillation: Secondary | ICD-10-CM | POA: Diagnosis not present

## 2021-02-13 DIAGNOSIS — E261 Secondary hyperaldosteronism: Secondary | ICD-10-CM | POA: Diagnosis not present

## 2021-02-13 DIAGNOSIS — I509 Heart failure, unspecified: Secondary | ICD-10-CM | POA: Diagnosis not present

## 2021-02-13 DIAGNOSIS — D6869 Other thrombophilia: Secondary | ICD-10-CM | POA: Diagnosis not present

## 2021-02-13 NOTE — Progress Notes (Signed)
HPI: Follow-up atrial fibrillation. Catheterization September 2011 showed mild plaque in the LAD at 20 to 30%. Previously had a cardiomyopathy which did improve. Nuclear study August 2019 showed ejection fraction 48% with fixed anteroseptal defect felt secondary to left bundle branch block. No ischemia. Echocardiogram repeated December 2020 and showed normal LV function, mild left ventricular hypertrophy, grade 1 diastolic dysfunction,mild aortic insufficiency.Patient admitted May 2021 with GI bleed and syncope. Initial hemoglobin 6.9. EGD showed multiple clean-based superficial gastric ulcers. She was transfused 2 units packed red blood cells and treated with IV Protonix. Xarelto was held. Also had transient renal insufficiency with creatinine of 1.84 which improved prior to discharge. Since last seenshe has mild dyspnea on exertion.  No orthopnea or PND.  Minimal pedal edema.  No chest pain, palpitations or syncope.  Current Outpatient Medications  Medication Sig Dispense Refill  . acetaminophen (TYLENOL) 500 MG tablet Take 1,000 mg by mouth 3 (three) times daily.    Marland Kitchen atorvastatin (LIPITOR) 40 MG tablet TAKE 1 TABLET BY MOUTH EVERY DAY 90 tablet 6  . carvedilol (COREG) 12.5 MG tablet TAKE 1 TABLET BY MOUTH TWICE A DAY WITH MEALS 90 tablet 3  . Ferrous Sulfate (IRON PO) Take 1 tablet by mouth every other day.    . furosemide (LASIX) 20 MG tablet TAKE 1 TABLET BY MOUTH TWICE A DAY 180 tablet 1  . losartan (COZAAR) 25 MG tablet TAKE 1 TABLET BY MOUTH EVERY DAY 90 tablet 3  . omeprazole (PRILOSEC) 40 MG capsule TAKE 1 CAPSULE (40 MG TOTAL) BY MOUTH IN THE MORNING AND AT BEDTIME. 180 capsule 0  . polyvinyl alcohol-povidone (HYPOTEARS) 1.4-0.6 % ophthalmic solution Place 1-2 drops into both eyes daily as needed (dry eyes).     . potassium chloride (KLOR-CON) 10 MEQ tablet TAKE 1 TABLET (10 MEQ TOTAL) BY MOUTH 2 (TWO) TIMES DAILY. 180 tablet 3  . XARELTO 20 MG TABS tablet TAKE 1 TABLET (20  MG TOTAL) BY MOUTH DAILY WITH SUPPER. 90 tablet 1   No current facility-administered medications for this visit.     Past Medical History:  Diagnosis Date  . Aortic insufficiency   . Atrial fibrillation ([place])    a. s/p DCCV 03/2013  . CAD (coronary artery disease)    LHC 06/2010: EF 55%, mild plaque in the LAD 20-30%, otherwise normal coronary arteries  . CATARACTS   . Chronic systolic heart failure ([place])    in setting of AFib;  Echocardiogram 02/2013: Moderate LVH, EF 25-30%, anteroseptal and apical HK, moderate AI, MAC, moderate MR, moderate LAE, mild RAE, PASP 32 => after DCCV f/u Echo 8/14: Moderate LVH, EF 60-65%, normal wall motion, grade 1 diastolic dysfunction, moderate AI, moderate LAE  . Complication of anesthesia 2009   nausea and vomitting  . GERD (gastroesophageal reflux disease)   . HYPERLIPIDEMIA, MILD   . HYPERTENSION, BENIGN SYSTEMIC   . Irritable bowel syndrome   . LBBB (left bundle branch block)   . MEDIAL EPICONDYLITIS   . NSVT (nonsustained ventricular tachycardia) ([place])    during Dob Echo 2011 => normal cath  . OSTEOARTHRITIS   . OSTEOPENIA   . PONV (postoperative nausea and vomiting)   . Pre-diabetes   . RHINITIS, ALLERGIC   . Stroke ([place])   . SYNCOPE   . VITAMIN D DEFICIENCY     Past Surgical History:  Procedure Laterality Date  . BIOPSY  03/01/2020   Procedure: BIOPSY;  Surgeon: Otis Brace, MD;  Location: [facility]  ENDOSCOPY;  Service: Gastroenterology;;  . CARDIOVERSION N/A 04/01/2013   Procedure: CARDIOVERSION;  Surgeon: Josue Hector, MD;  Location: [facility] ENDOSCOPY;  Service: Cardiovascular;  Laterality: N/A;  . CARDIOVERSION N/A 07/16/2017   Procedure: CARDIOVERSION;  Surgeon: Pixie Casino, MD;  Location: [facility] ENDOSCOPY;  Service: Cardiovascular;  Laterality: N/A;  . CARDIOVERSION N/A 07/07/2018   Procedure: CARDIOVERSION;  Surgeon: Pixie Casino, MD;  Location: [place];  Service: Cardiovascular;  Laterality: N/A;  . CATARACT EXTRACTION,  BILATERAL  2012   Dr. Herbert Deaner  . COLONOSCOPY WITH PROPOFOL N/A 05/23/2017   Procedure: COLONOSCOPY WITH PROPOFOL;  Surgeon: Wonda Horner, MD;  Location: [facility] ENDOSCOPY;  Service: Endoscopy;  Laterality: N/A;  . ESOPHAGOGASTRODUODENOSCOPY N/A 03/01/2020   Procedure: ESOPHAGOGASTRODUODENOSCOPY (EGD);  Surgeon: Otis Brace, MD;  Location: [facility] ENDOSCOPY;  Service: Gastroenterology;  Laterality: N/A;  . ESOPHAGOGASTRODUODENOSCOPY (EGD) WITH PROPOFOL N/A 05/23/2017   Procedure: ESOPHAGOGASTRODUODENOSCOPY (EGD) WITH PROPOFOL;  Surgeon: Wonda Horner, MD;  Location: [facility] ENDOSCOPY;  Service: Endoscopy;  Laterality: N/A;  . IR KYPHO LUMBAR INC FX REDUCE BONE BX UNI/BIL CANNULATION INC/IMAGING  06/06/2017  . IR RADIOLOGIST EVAL & MGMT  07/01/2017  . RADICAL HYSTERECTOMY    . REPLACEMENT TOTAL KNEE     11/03/07 right....... 03/29/08 left    Social History   Socioeconomic History  . Marital status: Divorced    Spouse name: Not on file  . Number of children: 1  . Years of education: 34  . Highest education level: High school graduate  Occupational History  . Occupation: Retired    Fish farm manager: RETIRED  Tobacco Use  . Smoking status: Former Smoker    Quit date: 10/08/1970    Years since quitting: 50.4  . Smokeless tobacco: Never Used  Vaping Use  . Vaping Use: Never used  Substance and Sexual Activity  . Alcohol use: No  . Drug use: No  . Sexual activity: Not on file  Other Topics Concern  . Not on file  Social History Narrative   Lives by herself at home, next to her daughter. She enjoys watching tv in her free time.   Social Determinants of Health   Financial Resource Strain: Low Risk   . Difficulty of Paying Living Expenses: Not hard at all  Food Insecurity: No Food Insecurity  . Worried About Charity fundraiser in the Last Year: Never true  . Ran Out of Food in the Last Year: Never true  Transportation Needs: No Transportation Needs  . Lack of Transportation (Medical): No  . Lack of  Transportation (Non-Medical): No  Physical Activity: Inactive  . Days of Exercise per Week: 0 days  . Minutes of Exercise per Session: 0 min  Stress: No Stress Concern Present  . Feeling of Stress : Not at all  Social Connections: Socially Isolated  . Frequency of Communication with Friends and Family: More than three times a week  . Frequency of Social Gatherings with Friends and Family: Twice a week  . Attends Religious Services: Never  . Active Member of Clubs or Organizations: No  . Attends Archivist Meetings: Never  . Marital Status: Divorced  Human resources officer Violence: Not At Risk  . Fear of Current or Ex-Partner: No  . Emotionally Abused: No  . Physically Abused: No  . Sexually Abused: No    Family History  Problem Relation Age of Onset  . Arrhythmia Mother        afib  . Alzheimer's disease Mother   .  Stroke Mother   . Colon cancer Father   . Heart attack Father 70  . Diabetes Sister   . Arrhythmia Sister        afib  . Stroke Maternal Grandmother     ROS: Back and leg pain but no fevers or chills, productive cough, hemoptysis, dysphasia, odynophagia, melena, hematochezia, dysuria, hematuria, rash, seizure activity, orthopnea, PND, claudication. Remaining systems are negative.  Physical Exam: Well-developed obese in no acute distress.  Skin is warm and dry.  HEENT is normal.  Neck is supple.  Chest is clear to auscultation with normal expansion.  Cardiovascular exam is irregular Abdominal exam nontender or distended. No masses palpated. Extremities show trace edema. neuro grossly intact  A/P  1 permanent atrial fibrillation-plan is rate control and anticoagulation.  Continue carvedilol and Xarelto.  Given history of CVA she will require lifelong anticoagulation.  2 hypertension-patient's blood pressure is borderline.  I have asked him to track this and we will advance medications if needed.  3 hyperlipidemia-continue statin.  4 history of  coronary artery disease-patient is not on aspirin given need for Xarelto.  Continue statin.  5 aortic insufficiency-mild on most recent echocardiogram.  6 lower extremity edema-continue diuretic at present dose.  Check potassium and renal function.  7 prior CVA-occurred while patient was on Xarelto and neurology added aspirin.  However aspirin was discontinued due to GI bleed.  Kirk Ruths, MD

## 2021-02-17 ENCOUNTER — Ambulatory Visit (INDEPENDENT_AMBULATORY_CARE_PROVIDER_SITE_OTHER): Payer: Medicare HMO | Admitting: Family Medicine

## 2021-02-17 DIAGNOSIS — Z0000 Encounter for general adult medical examination without abnormal findings: Secondary | ICD-10-CM | POA: Diagnosis not present

## 2021-02-17 NOTE — Progress Notes (Addendum)
MEDICARE ANNUAL WELLNESS VISIT  02/17/2021  Telephone Visit Disclaimer This Medicare AWV was conducted by telephone due to national recommendations for restrictions regarding the COVID-19 Pandemic (e.g. social distancing).  I verified, using two identifiers, that I am speaking with Charlotte [name] or their authorized healthcare agent. I discussed the limitations, risks, security, and privacy concerns of performing an evaluation and management service by telephone and the potential availability of an in-person appointment in the future. The patient expressed understanding and agreed to proceed.  Location of Patient: Home Location of Provider (nurse):  In the office.  Subjective:    Charlotte [name] is a 85 y.o. female patient of Metheney, Rene Kocher, MD who had a Medicare Annual Wellness Visit today via telephone. Charlotte [name] is Retired and lives alone. she has 1 child. she reports that she is socially active and does interact with friends/family regularly. she is minimally physically active and enjoys watching t.v.  Patient Care Team: [name], MD as PCP - General Josue Hector, MD as PCP - Cardiology (Cardiology)  Advanced Directives 02/17/2021 03/30/2020 02/29/2020 02/29/2020 01/03/2019 10/14/2018 07/07/2018  Does Patient Have a Medical Advance Directive? Yes No No No Yes Yes Yes  Type of Advance Directive Living will;Healthcare Power of [place];Living will Living will  Does patient want to make changes to medical advance directive? No - Patient declined - - - - - -  Copy of [place] in Chart? No - copy requested - - - - No - copy requested -  Would patient like information on creating a medical advance directive? - - No - Patient declined - No - Patient declined - [facility] Utilization Over the Past 12 Months: # of hospitalizations or ER visits: 0 # of surgeries: 0  Review of Systems    Patient  reports that her overall health is worse compared to last year.  History obtained from chart review and the patient  Patient Reported Readings (BP, Pulse, CBG, Weight, etc) none  Pain Assessment Pain : No/denies pain     Current Medications & Allergies (verified) Allergies as of 02/17/2021      Reactions   Metformin And Related Other (See Comments)   Renal failure   Amlodipine Other (See Comments)   HEADACHE      Medication List       Accurate as of Feb 17, 2021  2:53 PM. If you have any questions, ask your nurse or doctor.        acetaminophen 500 MG tablet Commonly known as: TYLENOL Take 1,000 mg by mouth 3 (three) times daily.   atorvastatin 40 MG tablet Commonly known as: LIPITOR TAKE 1 TABLET BY MOUTH EVERY DAY   carvedilol 12.5 MG tablet Commonly known as: COREG TAKE 1 TABLET BY MOUTH TWICE A DAY WITH MEALS   furosemide 20 MG tablet Commonly known as: LASIX TAKE 1 TABLET BY MOUTH TWICE A DAY   IRON PO Take 1 tablet by mouth every other day.   losartan 25 MG tablet Commonly known as: COZAAR TAKE 1 TABLET BY MOUTH EVERY DAY   omeprazole 40 MG capsule Commonly known as: PRILOSEC TAKE 1 CAPSULE (40 MG TOTAL) BY MOUTH IN THE MORNING AND AT BEDTIME.   polyvinyl alcohol-povidone 1.4-0.6 % ophthalmic solution Commonly known as: HYPOTEARS Place 1-2 drops into both eyes daily as needed (dry eyes).   potassium chloride 10 MEQ tablet Commonly  known as: KLOR-CON TAKE 1 TABLET (10 MEQ TOTAL) BY MOUTH 2 (TWO) TIMES DAILY.   Xarelto 20 MG Tabs tablet Generic drug: rivaroxaban TAKE 1 TABLET (20 MG TOTAL) BY MOUTH DAILY WITH SUPPER.       History (reviewed): Past Medical History:  Diagnosis Date  . Aortic insufficiency   . Atrial fibrillation ([place])    a. s/p DCCV 03/2013  . CAD (coronary artery disease)    LHC 06/2010: EF 55%, mild plaque in the LAD 20-30%, otherwise normal coronary arteries  . CATARACTS   . Chronic systolic heart failure ([place])    in  setting of AFib;  Echocardiogram 02/2013: Moderate LVH, EF 25-30%, anteroseptal and apical HK, moderate AI, MAC, moderate MR, moderate LAE, mild RAE, PASP 32 => after DCCV f/u Echo 8/14: Moderate LVH, EF 60-65%, normal wall motion, grade 1 diastolic dysfunction, moderate AI, moderate LAE  . Complication of anesthesia 2009   nausea and vomitting  . GERD (gastroesophageal reflux disease)   . HYPERLIPIDEMIA, MILD   . HYPERTENSION, BENIGN SYSTEMIC   . Irritable bowel syndrome   . LBBB (left bundle branch block)   . MEDIAL EPICONDYLITIS   . NSVT (nonsustained ventricular tachycardia) ([place])    during Dob Echo 2011 => normal cath  . OSTEOARTHRITIS   . OSTEOPENIA   . PONV (postoperative nausea and vomiting)   . Pre-diabetes   . RHINITIS, ALLERGIC   . Stroke ([place])   . SYNCOPE   . VITAMIN D DEFICIENCY    Past Surgical History:  Procedure Laterality Date  . BIOPSY  03/01/2020   Procedure: BIOPSY;  Surgeon: Otis Brace, MD;  Location: [place];  Service: Gastroenterology;;  . CARDIOVERSION N/A 04/01/2013   Procedure: CARDIOVERSION;  Surgeon: Josue Hector, MD;  Location: [facility] ENDOSCOPY;  Service: Cardiovascular;  Laterality: N/A;  . CARDIOVERSION N/A 07/16/2017   Procedure: CARDIOVERSION;  Surgeon: Pixie Casino, MD;  Location: [facility] ENDOSCOPY;  Service: Cardiovascular;  Laterality: N/A;  . CARDIOVERSION N/A 07/07/2018   Procedure: CARDIOVERSION;  Surgeon: Pixie Casino, MD;  Location: [place];  Service: Cardiovascular;  Laterality: N/A;  . CATARACT EXTRACTION, BILATERAL  2012   Dr. Herbert Deaner  . COLONOSCOPY WITH PROPOFOL N/A 05/23/2017   Procedure: COLONOSCOPY WITH PROPOFOL;  Surgeon: Wonda Horner, MD;  Location[facility] ENDOSCOPY;  Service: Endoscopy;  Laterality: N/A;  . ESOPHAGOGASTRODUODENOSCOPY N/A 03/01/2020   Procedure: ESOPHAGOGASTRODUODENOSCOPY (EGD);  Surgeon: Otis Brace, MD;  Location[facility] ENDOSCOPY;  Service: Gastroenterology;  Laterality: N/A;  .  ESOPHAGOGASTRODUODENOSCOPY (EGD) WITH PROPOFOL N/A 05/23/2017   Procedure: ESOPHAGOGASTRODUODENOSCOPY (EGD) WITH PROPOFOL;  Surgeon: Wonda Horner, MD;  Location[facility] ENDOSCOPY;  Service: Endoscopy;  Laterality: N/A;  . IR KYPHO LUMBAR INC FX REDUCE BONE BX UNI/BIL CANNULATION INC/IMAGING  06/06/2017  . IR RADIOLOGIST EVAL & MGMT  07/01/2017  . RADICAL HYSTERECTOMY    . REPLACEMENT TOTAL KNEE     11/03/07 right....... 03/29/08 left   Family History  Problem Relation Age of Onset  . Arrhythmia Mother        afib  . Alzheimer's disease Mother   . Stroke Mother   . Colon cancer Father   . Heart attack Father 75  . Diabetes Sister   . Arrhythmia Sister        afib  . Stroke Maternal Grandmother    Social History   Socioeconomic History  . Marital status: Divorced    Spouse name: Not on file  . Number of children: 1  . Years  of education: 56  . Highest education level: High school graduate  Occupational History  . Occupation: Retired    Fish farm manager: RETIRED  Tobacco Use  . Smoking status: Former Smoker    Quit date: 10/08/1970    Years since quitting: 50.3  . Smokeless tobacco: Never Used  Vaping Use  . Vaping Use: Never used  Substance and Sexual Activity  . Alcohol use: No  . Drug use: No  . Sexual activity: Not on file  Other Topics Concern  . Not on file  Social History Narrative   Lives by herself at home, next to her daughter. She enjoys watching tv in her free time.   Social Determinants of Health   Financial Resource Strain: Low Risk   . Difficulty of Paying Living Expenses: Not hard at all  Food Insecurity: No Food Insecurity  . Worried About Charity fundraiser in the Last Year: Never true  . Ran Out of Food in the Last Year: Never true  Transportation Needs: No Transportation Needs  . Lack of Transportation (Medical): No  . Lack of Transportation (Non-Medical): No  Physical Activity: Inactive  . Days of Exercise per Week: 0 days  . Minutes of Exercise per  Session: 0 min  Stress: No Stress Concern Present  . Feeling of Stress : Not at all  Social Connections: Socially Isolated  . Frequency of Communication with Friends and Family: More than three times a week  . Frequency of Social Gatherings with Friends and Family: Twice a week  . Attends Religious Services: Never  . Active Member of Clubs or Organizations: No  . Attends Archivist Meetings: Never  . Marital Status: Divorced    Activities of Daily Living In your present state of health, do you have any difficulty performing the following activities: 02/17/2021 02/29/2020  Hearing? N -  Vision? N -  Difficulty concentrating or making decisions? N -  Walking or climbing stairs? Y -  Comment stairs- she has a ramp to come into the house. she usually uses a walker as well -  Dressing or bathing? N -  Doing errands, shopping? Y N  Comment She has't driven in past four months. -  Preparing Food and eating ? N -  Comment her daughter usually cooks for her. -  Using the Toilet? N -  In the past six months, have you accidently leaked urine? Y -  Comment She just didn't get to the bathroom on time. -  Do you have problems with loss of bowel control? Y -  Comment she just didn't get to the bathroom on time. -  Managing your Medications? Y -  Comment her daughter helps with the pill box -  Managing your Finances? N -  Housekeeping or managing your Housekeeping? Y -  Comment her daughter helps with the housekeeping -  Some recent data might be hidden    Patient Education/ Literacy How often do you need to have someone help you when you read instructions, pamphlets, or other written materials from your doctor or pharmacy?: 1 - Never What is the last grade level you completed in school?: 12th grade  Exercise Current Exercise Habits: The patient does not participate in regular exercise at present, Exercise limited by: None identified  Diet Patient reports consuming 3 meals a day  and 1 snack(s) a day Patient reports that her primary diet is: Regular Patient reports that she does have regular access to food.   Depression Screen PHQ  2/9 Scores 02/17/2021 01/31/2021 09/15/2020 04/12/2020 04/13/2019 01/12/2019 04/09/2018  PHQ - 2 Score 0 1 4 0 0 0 1  PHQ- 9 Score - - 9 - - - -     Fall Risk Fall Risk  02/17/2021 05/12/2020 04/12/2020 04/13/2019 04/09/2018  Falls in the past year? 0 0 - 0 No  Comment - - - - -  Number falls in past yr: 0 - - 0 -  Comment - - - - -  Injury with Fall? 0 - - 0 -  Comment - - - - -  Risk Factor Category  - - - - -  Risk for fall due to : No Fall Risks - Impaired mobility - -  Risk for fall due to: Comment - - - - -  Follow up Falls evaluation completed - - - -  Comment - - - - -     Objective:  Charlotte [name] seemed alert and oriented and she participated appropriately during our telephone visit.  Blood Pressure Weight BMI  BP Readings from Last 3 Encounters:  01/31/21 130/68  12/01/20 138/74  10/20/20 (!) 142/89   Wt Readings from Last 3 Encounters:  01/31/21 297 lb (134.7 kg)  12/01/20 298 lb (135.2 kg)  10/20/20 296 lb 6.4 oz (134.4 kg)   BMI Readings from Last 1 Encounters:  01/31/21 52.61 kg/m    *Unable to obtain current vital signs, weight, and BMI due to telephone visit type  Hearing/Vision  . Charlotte [name] did not seem to have difficulty with hearing/understanding during the telephone conversation . Reports that she has had a formal eye exam by an eye care professional within the past year . Reports that she has not had a formal hearing evaluation within the past year *Unable to fully assess hearing and vision during telephone visit type   Cognitive Function: 6CIT Screen 02/17/2021  What Year? 0 points  What month? 0 points  What time? 0 points  Count back from 20 0 points  Months in reverse 0 points  Repeat phrase 0 points  Total Score 0   (Normal:0-7, Significant for Dysfunction: >8)  Normal Cognitive Function Screening:  Yes   Immunization & Health Maintenance Record Immunization History  Administered Date(s) Administered  . Fluad Quad(high Dose 65+) 06/16/2020  . Influenza Whole 08/07/2006, 08/05/2007, 07/08/2009, 07/25/2009, 06/30/2010  . Influenza, High Dose Seasonal PF 07/24/2016, 07/04/2017, 07/13/2019  . Influenza,inj,Quad PF,6+ Mos 06/11/2013, 06/18/2014, 06/20/2015  . Influenza-Unspecified 08/11/2018  . Moderna Sars-Covid-2 Vaccination 12/06/2019, 01/09/2020  . PFIZER(Purple Top)SARS-COV-2 Vaccination 09/10/2020  . Pneumococcal Conjugate-13 07/19/2014  . Pneumococcal Polysaccharide-23 10/08/2002, 02/25/2017  . Td 07/25/2009  . Zoster 09/21/2009  . Zoster Recombinat (Shingrix) 10/28/2018, 04/30/2019    Health Maintenance  Topic Date Due  . OPHTHALMOLOGY EXAM  04/17/2021 (Originally 01/12/2021)  . TETANUS/TDAP  02/17/2022 (Originally 07/26/2019)  . INFLUENZA VACCINE  05/08/2021  . DEXA SCAN  07/11/2021  . HEMOGLOBIN A1C  08/02/2021  . FOOT EXAM  01/31/2022  . COVID-19 Vaccine  Completed  . PNA vac Low Risk Adult  Completed  . HPV VACCINES  Aged Out       Assessment  This is a routine wellness examination for Charlotte [name].  Health Maintenance: Due or Overdue There are no preventive care reminders to display for this patient.  Charlotte [name] does not need a referral for Community Assistance: Care Management:   no Social Work:    no Prescription Assistance:  no Nutrition/Diabetes Education:  no  Plan:  Personalized Goals Goals Addressed              This Visit's Progress   .  Patient Stated (pt-stated)        02/17/2021 AWV Goal: Exercise for General Health   Patient will verbalize understanding of the benefits of increased physical activity:  Exercising regularly is important. It will improve your overall fitness, flexibility, and endurance.  Regular exercise also will improve your overall health. It can help you control your weight, reduce stress, and improve your bone  density.  Over the next year, patient will increase physical activity as tolerated with a goal of at least 150 minutes of moderate physical activity per week.   You can tell that you are exercising at a moderate intensity if your heart starts beating faster and you start breathing faster but can still hold a conversation.  Moderate-intensity exercise ideas include:  Walking 1 mile (1.6 km) in about 15 minutes  Biking  Hiking  Golfing  Dancing  Water aerobics  Patient will verbalize understanding of everyday activities that increase physical activity by providing examples like the following: ? Yard work, such as: ? Pushing a Conservation officer, nature ? Raking and bagging leaves ? Washing your car ? Pushing a stroller ? Shoveling snow ? Gardening ? Washing windows or floors  Patient will be able to explain general safety guidelines for exercising:   Before you start a new exercise program, talk with your health care provider.  Do not exercise so much that you hurt yourself, feel dizzy, or get very short of breath.  [place]r comfortable clothes and [place]ister shoes with good support.  Drink plenty of water while you exercise to prevent dehydration or heat stroke.  Work out until your breathing and your heartbeat get faster.       Personalized Health Maintenance & Screening Recommendations  Td vaccine  Lung Cancer Screening Recommended: no (Low Dose CT Chest recommended if Age 45-80 years, 30 pack-year currently smoking OR have quit w/in past 15 years) Hepatitis C Screening recommended: no HIV Screening recommended: no  Advanced Directives: Written information was not prepared per patient's request.  Referrals & Orders No orders of the defined types were placed in this encounter.   Follow-up Plan . Follow-up with [name], MD as planned . Schedule your tetanus shot at your pharmacy. . Medicare wellness in one year.   I have personally reviewed and noted the following in  the patient's chart:   . Medical and social history . Use of alcohol, tobacco or illicit drugs  . Current medications and supplements . Functional ability and status . Nutritional status . Physical activity . Advanced directives . List of other physicians . Hospitalizations, surgeries, and ER visits in previous 12 months . Vitals . Screenings to include cognitive, depression, and falls . Referrals and appointments  In addition, I have reviewed and discussed with Charlotte [name] certain preventive protocols, quality metrics, and best practice recommendations. A written personalized care plan for preventive services as well as general preventive health recommendations is available and can be mailed to the patient at her request.      [name], RN  02/17/2021

## 2021-02-17 NOTE — Patient Instructions (Addendum)
MEDICARE ANNUAL WELLNESS VISIT Health Maintenance Summary and Written Plan of Care  [name] ,  Thank you for allowing me to perform your Medicare Annual Wellness Visit and for your ongoing commitment to your health.   Health Maintenance & Immunization History Health Maintenance  Topic Date Due  . OPHTHALMOLOGY EXAM  04/17/2021 (Originally 01/12/2021)  . TETANUS/TDAP  02/17/2022 (Originally 07/26/2019)  . INFLUENZA VACCINE  05/08/2021  . DEXA SCAN  07/11/2021  . HEMOGLOBIN A1C  08/02/2021  . FOOT EXAM  01/31/2022  . COVID-19 Vaccine  Completed  . PNA vac Low Risk Adult  Completed  . HPV VACCINES  Aged Out   Immunization History  Administered Date(s) Administered  . Fluad Quad(high Dose 65+) 06/16/2020  . Influenza Whole 08/07/2006, 08/05/2007, 07/08/2009, 07/25/2009, 06/30/2010  . Influenza, High Dose Seasonal PF 07/24/2016, 07/04/2017, 07/13/2019  . Influenza,inj,Quad PF,6+ Mos 06/11/2013, 06/18/2014, 06/20/2015  . Influenza-Unspecified 08/11/2018  . Moderna Sars-Covid-2 Vaccination 12/06/2019, 01/09/2020  . PFIZER(Purple Top)SARS-COV-2 Vaccination 09/10/2020  . Pneumococcal Conjugate-13 07/19/2014  . Pneumococcal Polysaccharide-23 10/08/2002, 02/25/2017  . Td 07/25/2009  . Zoster 09/21/2009  . Zoster Recombinat (Shingrix) 10/28/2018, 04/30/2019    These are the patient goals that we discussed: Goals Addressed              This Visit's Progress   .  Patient Stated (pt-stated)        02/17/2021 AWV Goal: Exercise for General Health   Patient will verbalize understanding of the benefits of increased physical activity:  Exercising regularly is important. It will improve your overall fitness, flexibility, and endurance.  Regular exercise also will improve your overall health. It can help you control your weight, reduce stress, and improve your bone density.  Over the next year, patient will increase physical activity as tolerated with a goal of at least 150 minutes  of moderate physical activity per week.   You can tell that you are exercising at a moderate intensity if your heart starts beating faster and you start breathing faster but can still hold a conversation.  Moderate-intensity exercise ideas include:  Walking 1 mile (1.6 km) in about 15 minutes  Biking  Hiking  Golfing  Dancing  Water aerobics  Patient will verbalize understanding of everyday activities that increase physical activity by providing examples like the following: ? Yard work, such as: ? Pushing a Surveyor, mining ? Raking and bagging leaves ? Washing your car ? Pushing a stroller ? Shoveling snow ? Gardening ? Washing windows or floors  Patient will be able to explain general safety guidelines for exercising:   Before you start a new exercise program, talk with your health care provider.  Do not exercise so much that you hurt yourself, feel dizzy, or get very short of breath.  [place] comfortable clothes and [place] shoes with good support.  Drink plenty of water while you exercise to prevent dehydration or heat stroke.  Work out until your breathing and your heartbeat get faster.         This is a list of Health Maintenance Items that are overdue or due now: Td vaccine  Orders/Referrals Placed Today: No orders of the defined types were placed in this encounter.  (Contact our referral department at [phone] if you have not spoken with someone about your referral appointment within the next 5 days)    Follow-up Plan . Follow-up with Agapito Games, MD as planned . Schedule your tetanus shot at your pharmacy. . Medicare wellness in one year.  Health Maintenance, Female Adopting a healthy lifestyle and getting preventive care are important in promoting health and wellness. Ask your health care provider about:  The right schedule for you to have regular tests and exams.  Things you can do on your own to prevent diseases and keep yourself  healthy. What should I know about diet, weight, and exercise? Eat a healthy diet  Eat a diet that includes plenty of vegetables, fruits, low-fat dairy products, and lean protein.  Do not eat a lot of foods that are high in solid fats, added sugars, or sodium.   Maintain a healthy weight Body mass index (BMI) is used to identify weight problems. It estimates body fat based on height and weight. Your health care provider can help determine your BMI and help you achieve or maintain a healthy weight. Get regular exercise Get regular exercise. This is one of the most important things you can do for your health. Most adults should:  Exercise for at least 150 minutes each week. The exercise should increase your heart rate and make you sweat (moderate-intensity exercise).  Do strengthening exercises at least twice a week. This is in addition to the moderate-intensity exercise.  Spend less time sitting. Even light physical activity can be beneficial. Watch cholesterol and blood lipids Have your blood tested for lipids and cholesterol at 83 years of age, then have this test every 5 years. Have your cholesterol levels checked more often if:  Your lipid or cholesterol levels are high.  You are older than 83 years of age.  You are at high risk for heart disease. What should I know about cancer screening? Depending on your health history and family history, you may need to have cancer screening at various ages. This may include screening for:  Breast cancer.  Cervical cancer.  Colorectal cancer.  Skin cancer.  Lung cancer. What should I know about heart disease, diabetes, and high blood pressure? Blood pressure and heart disease  High blood pressure causes heart disease and increases the risk of stroke. This is more likely to develop in people who have high blood pressure readings, are of African descent, or are overweight.  Have your blood pressure checked: ? Every 3-5 years if you are  69-63 years of age. ? Every year if you are 11 years old or older. Diabetes Have regular diabetes screenings. This checks your fasting blood sugar level. Have the screening done:  Once every three years after age 56 if you are at a normal weight and have a low risk for diabetes.  More often and at a younger age if you are overweight or have a high risk for diabetes. What should I know about preventing infection? Hepatitis B If you have a higher risk for hepatitis B, you should be screened for this virus. Talk with your health care provider to find out if you are at risk for hepatitis B infection. Hepatitis C Testing is recommended for:  Everyone born from [birth year] through 1963.  Anyone with known risk factors for hepatitis C. Sexually transmitted infections (STIs)  Get screened for STIs, including gonorrhea and chlamydia, if: ? You are sexually active and are younger than 83 years of age. ? You are older than 83 years of age and your health care provider tells you that you are at risk for this type of infection. ? Your sexual activity has changed since you were last screened, and you are at increased risk for chlamydia or gonorrhea. Ask your health care  provider if you are at risk.  Ask your health care provider about whether you are at high risk for HIV. Your health care provider may recommend a prescription medicine to help prevent HIV infection. If you choose to take medicine to prevent HIV, you should first get tested for HIV. You should then be tested every 3 months for as long as you are taking the medicine. Pregnancy  If you are about to stop having your period (premenopausal) and you may become pregnant, seek counseling before you get pregnant.  Take 400 to 800 micrograms (mcg) of folic acid every day if you become pregnant.  Ask for birth control (contraception) if you want to prevent pregnancy. Osteoporosis and menopause Osteoporosis is a disease in which the bones lose  minerals and strength with aging. This can result in bone fractures. If you are 29 years old or older, or if you are at risk for osteoporosis and fractures, ask your health care provider if you should:  Be screened for bone loss.  Take a calcium or vitamin D supplement to lower your risk of fractures.  Be given hormone replacement therapy (HRT) to treat symptoms of menopause. Follow these instructions at home: Lifestyle  Do not use any products that contain nicotine or tobacco, such as cigarettes, e-cigarettes, and chewing tobacco. If you need help quitting, ask your health care provider.  Do not use street drugs.  Do not share needles.  Ask your health care provider for help if you need support or information about quitting drugs. Alcohol use  Do not drink alcohol if: ? Your health care provider tells you not to drink. ? You are pregnant, may be pregnant, or are planning to become pregnant.  If you drink alcohol: ? Limit how much you use to 0-1 drink a day. ? Limit intake if you are breastfeeding.  Be aware of how much alcohol is in your drink. In the [place] one drink equals one 12 oz bottle of beer (355 mL), one 5 oz glass of wine (148 mL), or one 1 oz glass of hard liquor (44 mL). General instructions  Schedule regular health, dental, and eye exams.  Stay current with your vaccines.  Tell your health care provider if: ? You often feel depressed. ? You have ever been abused or do not feel safe at home. Summary  Adopting a healthy lifestyle and getting preventive care are important in promoting health and wellness.  Follow your health care provider's instructions about healthy diet, exercising, and getting tested or screened for diseases.  Follow your health care provider's instructions on monitoring your cholesterol and blood pressure. This information is not intended to replace advice given to you by your health care provider. Make sure you discuss any questions you  have with your health care provider. Document Revised: 09/17/2018 Document Reviewed: 09/17/2018 Elsevier Patient Education  2021 [employer].

## 2021-02-21 ENCOUNTER — Ambulatory Visit: Payer: Medicare HMO | Admitting: Cardiology

## 2021-02-21 ENCOUNTER — Encounter: Payer: Self-pay | Admitting: Cardiology

## 2021-02-21 ENCOUNTER — Other Ambulatory Visit: Payer: Self-pay

## 2021-02-21 VITALS — BP 140/90 | HR 74 | Ht 63.0 in | Wt 297.2 lb

## 2021-02-21 DIAGNOSIS — I10 Essential (primary) hypertension: Secondary | ICD-10-CM | POA: Diagnosis not present

## 2021-02-21 DIAGNOSIS — E785 Hyperlipidemia, unspecified: Secondary | ICD-10-CM

## 2021-02-21 DIAGNOSIS — I4821 Permanent atrial fibrillation: Secondary | ICD-10-CM | POA: Diagnosis not present

## 2021-02-21 DIAGNOSIS — I2510 Atherosclerotic heart disease of native coronary artery without angina pectoris: Secondary | ICD-10-CM | POA: Diagnosis not present

## 2021-02-21 NOTE — Patient Instructions (Signed)

## 2021-03-12 DIAGNOSIS — G4731 Primary central sleep apnea: Secondary | ICD-10-CM | POA: Diagnosis not present

## 2021-04-11 DIAGNOSIS — G4731 Primary central sleep apnea: Secondary | ICD-10-CM | POA: Diagnosis not present

## 2021-04-19 DIAGNOSIS — H02834 Dermatochalasis of left upper eyelid: Secondary | ICD-10-CM | POA: Diagnosis not present

## 2021-04-19 DIAGNOSIS — H43813 Vitreous degeneration, bilateral: Secondary | ICD-10-CM | POA: Diagnosis not present

## 2021-04-19 DIAGNOSIS — H527 Unspecified disorder of refraction: Secondary | ICD-10-CM | POA: Diagnosis not present

## 2021-04-19 DIAGNOSIS — Z961 Presence of intraocular lens: Secondary | ICD-10-CM | POA: Diagnosis not present

## 2021-04-19 DIAGNOSIS — H353131 Nonexudative age-related macular degeneration, bilateral, early dry stage: Secondary | ICD-10-CM | POA: Diagnosis not present

## 2021-04-19 DIAGNOSIS — H02831 Dermatochalasis of right upper eyelid: Secondary | ICD-10-CM | POA: Diagnosis not present

## 2021-04-19 DIAGNOSIS — E119 Type 2 diabetes mellitus without complications: Secondary | ICD-10-CM | POA: Diagnosis not present

## 2021-04-19 LAB — HM DIABETES EYE EXAM

## 2021-05-09 ENCOUNTER — Other Ambulatory Visit: Payer: Self-pay | Admitting: Family Medicine

## 2021-05-09 ENCOUNTER — Other Ambulatory Visit: Payer: Self-pay | Admitting: Cardiovascular Disease

## 2021-05-09 ENCOUNTER — Other Ambulatory Visit: Payer: Self-pay | Admitting: Cardiology

## 2021-05-09 DIAGNOSIS — I4819 Other persistent atrial fibrillation: Secondary | ICD-10-CM

## 2021-05-09 DIAGNOSIS — K279 Peptic ulcer, site unspecified, unspecified as acute or chronic, without hemorrhage or perforation: Secondary | ICD-10-CM

## 2021-05-10 ENCOUNTER — Telehealth: Payer: Self-pay

## 2021-05-10 MED ORDER — RIVAROXABAN 20 MG PO TABS: 20.0000 mg | ORAL_TABLET | Freq: Every day | ORAL | 0 refills | Status: DC

## 2021-05-10 NOTE — Telephone Encounter (Signed)
Pt's daughter called stating that the pt's insurance has changed and she needs RX for Xarelto sent to [facility].  Refill sent.  Tiajuana Amass, CMA

## 2021-05-12 DIAGNOSIS — G4731 Primary central sleep apnea: Secondary | ICD-10-CM | POA: Diagnosis not present

## 2021-05-23 ENCOUNTER — Emergency Department
Admission: EM | Admit: 2021-05-23 | Discharge: 2021-05-23 | Disposition: A | Discharge status: Home / Self Care | Payer: Medicare Other | Source: Home / Self Care | Attending: Family Medicine | Admitting: Family Medicine

## 2021-05-23 ENCOUNTER — Other Ambulatory Visit: Payer: Self-pay

## 2021-05-23 DIAGNOSIS — B372 Candidiasis of skin and nail: Secondary | ICD-10-CM | POA: Diagnosis not present

## 2021-05-23 MED ORDER — FLUCONAZOLE 150 MG PO TABS: 150.0000 mg | ORAL_TABLET | ORAL | 0 refills | Status: DC

## 2021-05-23 MED ORDER — NYSTATIN 100000 UNIT/GM EX POWD: 1.0000 "application " | Freq: Three times a day (TID) | CUTANEOUS | 0 refills | Status: DC

## 2021-05-23 MED ORDER — NYSTATIN 100000 UNIT/GM EX CREA: TOPICAL_CREAM | CUTANEOUS | 0 refills | Status: DC

## 2021-05-23 NOTE — Discharge Instructions (Addendum)
Take the Diflucan pill once a week for 4 weeks Use the nystatin cream 2 times a day.  Wash the rash with mild soap and water.  Pat dry.  Air dry thoroughly and then apply the cream Once the rash has resolved, use the powder twice a day to prevent recurrence Follow-up with your primary care doctor

## 2021-05-23 NOTE — ED Triage Notes (Signed)
Pt c/o rash underneath both breasts x 2 months. Worsening in last couple weeks, oozing in last week. Painful and itchy. OTC fungal powder and cornstarch prn.

## 2021-05-23 NOTE — ED Provider Notes (Signed)
[facility]    CSN: [id number] Arrival date & time: 05/23/21  1349      History   Chief Complaint Chief Complaint  Patient presents with   Rash    Underneath both breast    HPI Charlotte [name] is a 85 y.o. female.   HPI  December is here with her daughter for evaluation of a rash.  She has a rash under both of her breasts that has been getting progressively worse over weeks.  She states it is red, now the skin has broken open and is weeping.  It has a bad odor.  It was itchy initially and now it is painful.  Patient does not have diabetes but she states she is prediabetic.  She is otherwise feeling well Past Medical History:  Diagnosis Date   Aortic insufficiency    Atrial fibrillation ([place])    a. s/p DCCV 03/2013   CAD (coronary artery disease)    LHC 06/2010: EF 55%, mild plaque in the LAD 20-30%, otherwise normal coronary arteries   CATARACTS    Chronic systolic heart failure ([place])    in setting of AFib;  Echocardiogram 02/2013: Moderate LVH, EF 25-30%, anteroseptal and apical HK, moderate AI, MAC, moderate MR, moderate LAE, mild RAE, PASP 32 => after DCCV f/u Echo 8/14: Moderate LVH, EF 60-65%, normal wall motion, grade 1 diastolic dysfunction, moderate AI, moderate LAE   Complication of anesthesia 2009   nausea and vomitting   GERD (gastroesophageal reflux disease)    HYPERLIPIDEMIA, MILD    HYPERTENSION, BENIGN SYSTEMIC    Irritable bowel syndrome    LBBB (left bundle branch block)    MEDIAL EPICONDYLITIS    NSVT (nonsustained ventricular tachycardia) ([place])    during Dob Echo 2011 => normal cath   OSTEOARTHRITIS    OSTEOPENIA    PONV (postoperative nausea and vomiting)    Pre-diabetes    RHINITIS, ALLERGIC    Stroke ([place])    SYNCOPE    VITAMIN D DEFICIENCY     Patient Active Problem List   Diagnosis Date Noted   Sleep-related hypoxia 07/09/2020   Severe sleep apnea 07/09/2020   Complex sleep apnea syndrome 06/23/2020   Weight loss, non-intentional  06/16/2020   Iron deficiency anemia due to chronic blood loss 06/16/2020   Sleeps in sitting position due to orthopnea 05/12/2020   Chronic combined systolic and diastolic congestive heart failure ([place]) 05/12/2020   Morbid obesity ([place]) 05/12/2020   Excessive daytime sleepiness 05/12/2020   Nocturia 01/07/2020   Lacunar stroke ([place]) 09/28/2019    Class: History of   Stasis, venous 04/13/2019   NICM (nonischemic cardiomyopathy) ([place]) 07/12/2017   Low back pain without sciatica 06/05/2017   Compression fracture of L3 lumbar vertebra 06/02/2017   Low back pain 06/01/2017   Falls 06/01/2017   Intractable back pain 06/01/2017   Arteriosclerosis of abdominal aorta ([place]) 05/30/2017   Lumbosacral strain 05/29/2017   Symptomatic anemia 05/21/2017   Hyponatremia 05/21/2017   Gastrointestinal hemorrhage    Chronic constipation 05/10/2015   Atrial fibrillation ([place]) 11/10/2014   Severe obesity (BMI >= 40) ([place]) 06/18/2014   Spinal stenosis of lumbar region without neurogenic claudication 03/30/2014   Restless legs syndrome (RLS) 03/30/2014   Idiopathic progressive polyneuropathy 03/30/2014   Aortic regurgitation 03/03/2014   Diabetes mellitus type 2 in obese ([place]) 12/09/2013   Long term current use of anticoagulant therapy 05/14/2013   VITAMIN D DEFICIENCY 10/31/2010   VENTRICULAR TACHYCARDIA 05/31/2010   DYSPNEA 04/24/2010  CATARACTS 04/20/2010   Edema 02/28/2010   ELECTROCARDIOGRAM, ABNORMAL 09/19/2007   Osteoarthritis of both ankles 01/01/2007   Hyperlipidemia 08/13/2006   Essential hypertension 07/16/2006   RHINITIS, ALLERGIC 07/16/2006   GASTROESOPHAGEAL REFLUX, NO ESOPHAGITIS 07/16/2006   IRRITABLE BOWEL SYNDROME 07/16/2006   OSTEOPENIA 07/16/2006    Past Surgical History:  Procedure Laterality Date   BIOPSY  03/01/2020   Procedure: BIOPSY;  Surgeon: Otis Brace, MD;  Location: [facility];  Service: Gastroenterology;;   CARDIOVERSION N/A 04/01/2013   Procedure:  CARDIOVERSION;  Surgeon: Josue Hector, MD;  [facility] ENDOSCOPY;  Service: Cardiovascular;  Laterality: N/A;   CARDIOVERSION N/A 07/16/2017   Procedure: CARDIOVERSION;  Surgeon: Pixie Casino, MD;  Location: [facility];  Service: Cardiovascular;  Laterality: N/A;   CARDIOVERSION N/A 07/07/2018   Procedure: CARDIOVERSION;  Surgeon: Pixie Casino, MD;  Location: [facility];  Service: Cardiovascular;  Laterality: N/A;   CATARACT EXTRACTION, BILATERAL  2012   Dr. Herbert Deaner   COLONOSCOPY WITH PROPOFOL N/A 05/23/2017   Procedure: COLONOSCOPY WITH PROPOFOL;  Surgeon: Wonda Horner, MD;  Location[facility] ENDOSCOPY;  Service: Endoscopy;  Laterality: N/A;   ESOPHAGOGASTRODUODENOSCOPY N/A 03/01/2020   Procedure: ESOPHAGOGASTRODUODENOSCOPY (EGD);  Surgeon: Otis Brace, MD;  Location[facility] ENDOSCOPY;  Service: Gastroenterology;  Laterality: N/A;   ESOPHAGOGASTRODUODENOSCOPY (EGD) WITH PROPOFOL N/A 05/23/2017   Procedure: ESOPHAGOGASTRODUODENOSCOPY (EGD) WITH PROPOFOL;  Surgeon: Wonda Horner, MD;  Location: [facility] ENDOSCOPY;  Service: Endoscopy;  Laterality: N/A;   IR KYPHO LUMBAR INC FX REDUCE BONE BX UNI/BIL CANNULATION INC/IMAGING  06/06/2017   IR RADIOLOGIST EVAL & MGMT  07/01/2017   RADICAL HYSTERECTOMY     REPLACEMENT TOTAL KNEE     11/03/07 right....... 03/29/08 left    OB History   No obstetric history on file.      Home Medications    Prior to Admission medications   Medication Sig Start Date End Date Taking? Authorizing Provider  fluconazole (DIFLUCAN) 150 MG tablet Take 1 tablet (150 mg total) by mouth once a week. 05/23/21  Yes Raylene Everts, MD  nystatin (MYCOSTATIN/NYSTOP) powder Apply 1 application topically 3 (three) times daily. 05/23/21  Yes Raylene Everts, MD  nystatin cream (MYCOSTATIN) Apply to affected area 2 times daily 05/23/21  Yes Raylene Everts, MD  acetaminophen (TYLENOL) 500 MG tablet Take 1,000 mg by mouth 3 (three) times daily.    [provider]   atorvastatin (LIPITOR) 40 MG tablet TAKE 1 TABLET BY MOUTH EVERY DAY 12/06/20   Melvenia Beam, MD  carvedilol (COREG) 12.5 MG tablet TAKE 1 TABLET BY MOUTH TWICE A DAY WITH MEALS 05/09/21   Lelon Perla, MD  Ferrous Sulfate (IRON PO) Take 1 tablet by mouth every other day.    [provider]  furosemide (LASIX) 20 MG tablet TAKE 1 TABLET BY MOUTH TWICE A DAY 02/09/21   Lelon Perla, MD  losartan (COZAAR) 25 MG tablet TAKE 1 TABLET BY MOUTH EVERY DAY 06/15/20   Lelon Perla, MD  omeprazole (PRILOSEC) 40 MG capsule TAKE 1 CAPSULE (40 MG TOTAL) BY MOUTH IN THE MORNING AND AT BEDTIME. 05/16/21 06/15/21  [name], MD  polyvinyl alcohol-povidone (HYPOTEARS) 1.4-0.6 % ophthalmic solution Place 1-2 drops into both eyes daily as needed (dry eyes).     [provider]  potassium chloride (KLOR-CON) 10 MEQ tablet TAKE 1 TABLET (10 MEQ TOTAL) BY MOUTH 2 (TWO) TIMES DAILY. 05/10/21   Lelon Perla, MD  rivaroxaban Alveda Reasons)  20 MG TABS tablet Take 1 tablet (20 mg total) by mouth daily with supper. 05/10/21   [name], MD    Family History Family History  Problem Relation Age of Onset   Arrhythmia Mother        afib   Alzheimer's disease Mother    Stroke Mother    Colon cancer Father    Heart attack Father 70   Diabetes Sister    Arrhythmia Sister        afib   Stroke Maternal Grandmother     Social History Social History   Tobacco Use   Smoking status: Former    Types: Cigarettes    Quit date: 10/08/1970    Years since quitting: 50.6   Smokeless tobacco: Never  Vaping Use   Vaping Use: Never used  Substance Use Topics   Alcohol use: No   Drug use: No     Allergies   Metformin and related and Amlodipine   Review of Systems Review of Systems  See HPI Physical Exam Triage Vital Signs ED Triage Vitals  Enc Vitals Group     BP 05/23/21 1400 108/79     Pulse Rate 05/23/21 1400 91     Resp 05/23/21 1400 18     Temp 05/23/21 1400  98.3 F (36.8 C)     Temp Source 05/23/21 1400 Oral     SpO2 05/23/21 1400 96 %     Weight --      Height --      Head Circumference --      Peak Flow --      Pain Score 05/23/21 1431 0     Pain Loc --      Pain Edu? --      Excl. in [place]? --    No data found.  Updated Vital Signs BP 108/79 (BP Location: Left Arm)   Pulse 91   Temp 98.3 F (36.8 C) (Oral)   Resp 18   SpO2 96%      Physical Exam Constitutional:      General: She is not in acute distress.    Appearance: She is well-developed. She is obese.  HENT:     Head: Normocephalic and atraumatic.     Nose:     Comments: Mask is in place Eyes:     Conjunctiva/sclera: Conjunctivae normal.     Pupils: Pupils are equal, round, and reactive to light.  Cardiovascular:     Rate and Rhythm: Normal rate.  Pulmonary:     Effort: Pulmonary effort is normal. No respiratory distress.  Abdominal:     General: There is no distension.     Palpations: Abdomen is soft.  Musculoskeletal:        General: Normal range of motion.     Cervical back: Normal range of motion.  Skin:    General: Skin is warm and dry.     Findings: Rash present.     Comments: Area underneath the breast is examined.  The skin is deeply red, macerated, and malodorous discharge is present.  There are a few satellite lesions.  Neurological:     Mental Status: She is alert.     UC Treatments / Results  Labs (all labs ordered are listed, but only abnormal results are displayed) Labs Reviewed - No data to display  EKG   Radiology No results found.  Procedures Procedures (including critical care time)  Medications Ordered in UC Medications - No data to display  Initial Impression / Assessment and Plan / UC Course  I have reviewed the triage vital signs and the nursing notes.  Pertinent labs & imaging results that were available during my care of the patient were reviewed by me and considered in my medical decision making (see chart for  details).     Discussed moisture is the main reason for the rash.  Discussed keeping the area dry.  We reviewed cleaning, cream, powders once the cream has cleared up the rash, and taking Diflucan as directed.  Follow-up with primary care. Final Clinical Impressions(s) / UC Diagnoses   Final diagnoses:  Candidal intertrigo     Discharge Instructions      Take the Diflucan pill once a week for 4 weeks Use the nystatin cream 2 times a day.  Wash the rash with mild soap and water.  Pat dry.  Air dry thoroughly and then apply the cream Once the rash has resolved, use the powder twice a day to prevent recurrence Follow-up with your primary care doctor   ED Prescriptions     Medication Sig Dispense Auth. Provider   nystatin cream (MYCOSTATIN) Apply to affected area 2 times daily 30 g Raylene Everts, MD   nystatin (MYCOSTATIN/NYSTOP) powder Apply 1 application topically 3 (three) times daily. 15 g Raylene Everts, MD   fluconazole (DIFLUCAN) 150 MG tablet Take 1 tablet (150 mg total) by mouth once a week. 4 tablet Raylene Everts, MD      PDMP not reviewed this encounter.   Raylene Everts, MD 05/23/21 1435

## 2021-06-06 ENCOUNTER — Ambulatory Visit (INDEPENDENT_AMBULATORY_CARE_PROVIDER_SITE_OTHER): Payer: Medicare Other | Admitting: Family Medicine

## 2021-06-06 ENCOUNTER — Other Ambulatory Visit: Payer: Self-pay

## 2021-06-06 ENCOUNTER — Encounter: Payer: Self-pay | Admitting: Family Medicine

## 2021-06-06 VITALS — BP 136/68 | HR 90 | Ht 63.0 in | Wt 291.0 lb

## 2021-06-06 DIAGNOSIS — Z23 Encounter for immunization: Secondary | ICD-10-CM

## 2021-06-06 DIAGNOSIS — I10 Essential (primary) hypertension: Secondary | ICD-10-CM

## 2021-06-06 DIAGNOSIS — G603 Idiopathic progressive neuropathy: Secondary | ICD-10-CM

## 2021-06-06 DIAGNOSIS — E669 Obesity, unspecified: Secondary | ICD-10-CM

## 2021-06-06 DIAGNOSIS — H3530 Unspecified macular degeneration: Secondary | ICD-10-CM | POA: Diagnosis not present

## 2021-06-06 DIAGNOSIS — E1169 Type 2 diabetes mellitus with other specified complication: Secondary | ICD-10-CM | POA: Diagnosis not present

## 2021-06-06 DIAGNOSIS — I4891 Unspecified atrial fibrillation: Secondary | ICD-10-CM

## 2021-06-06 LAB — POCT GLYCOSYLATED HEMOGLOBIN (HGB A1C): Hemoglobin A1C: 6.1 % — AB (ref 4.0–5.6)

## 2021-06-06 NOTE — Progress Notes (Signed)
Established Patient Office Visit  Subjective:  Patient ID: Charlotte [name], female    DOB: Dec 01, 1935  Age: 85 y.o. MRN: [id number]  CC:  Chief Complaint  Patient presents with   Diabetes   Hypertension   Leg Pain    Bilateral leg pain and swelling.      HPI Charlotte [name] presents for   Diabetes - no hypoglycemic events. No wounds or sores that are not healing well. No increased thirst or urination. Checking glucose at home. Taking medications as prescribed without any side effects. Eye exam is UTD.    Hypertension- Pt denies chest pain, SOB, dizziness, or heart palpitations.  Taking meds as directed w/o problems.  Denies medication side effects.    F/U Afib -recent chest pain or shortness of breath.  Currently on Xarelto and carvedilol.  Bilateral leg pain -continues to struggle with bilateral leg pain.  He does have some chronic lower extremity swelling.  She says it hurts and sometimes even throbs and sometimes it feels itchy.  Comfort is mostly from the knees down.  Sitting actually provides some relief.  Movement and standing up is more uncomfortable.  She does use a walker.  She does have chronic back pain.  She says the pain is persistent all day and is every day even at rest.  Rates her pain as a 6-8/10.   She did not let me know she started a supplement called Areds for macular degeneration  Past Medical History:  Diagnosis Date   Aortic insufficiency    Atrial fibrillation ([place])    a. s/p DCCV 03/2013   CAD (coronary artery disease)    LHC 06/2010: EF 55%, mild plaque in the LAD 20-30%, otherwise normal coronary arteries   CATARACTS    Chronic systolic heart failure ([place])    in setting of AFib;  Echocardiogram 02/2013: Moderate LVH, EF 25-30%, anteroseptal and apical HK, moderate AI, MAC, moderate MR, moderate LAE, mild RAE, PASP 32 => after DCCV f/u Echo 8/14: Moderate LVH, EF 60-65%, normal wall motion, grade 1 diastolic dysfunction, moderate AI, moderate LAE    Complication of anesthesia 2009   nausea and vomitting   GERD (gastroesophageal reflux disease)    HYPERLIPIDEMIA, MILD    HYPERTENSION, BENIGN SYSTEMIC    Irritable bowel syndrome    LBBB (left bundle branch block)    MEDIAL EPICONDYLITIS    NSVT (nonsustained ventricular tachycardia) ([place])    during Dob Echo 2011 => normal cath   OSTEOARTHRITIS    OSTEOPENIA    PONV (postoperative nausea and vomiting)    Pre-diabetes    RHINITIS, ALLERGIC    Stroke ([place])    SYNCOPE    VITAMIN D DEFICIENCY     Past Surgical History:  Procedure Laterality Date   BIOPSY  03/01/2020   Procedure: BIOPSY;  Surgeon: Otis Brace, MD;  Location: [place];  Service: Gastroenterology;;   CARDIOVERSION N/A 04/01/2013   Procedure: CARDIOVERSION;  Surgeon: Josue Hector, MD;  Location: [facility] ENDOSCOPY;  Service: Cardiovascular;  Laterality: N/A;   CARDIOVERSION N/A 07/16/2017   Procedure: CARDIOVERSION;  Surgeon: Pixie Casino, MD;  Location: [facility] ENDOSCOPY;  Service: Cardiovascular;  Laterality: N/A;   CARDIOVERSION N/A 07/07/2018   Procedure: CARDIOVERSION;  Surgeon: Pixie Casino, MD;  Location: [facility] ENDOSCOPY;  Service: Cardiovascular;  Laterality: N/A;   CATARACT EXTRACTION, BILATERAL  2012   Dr. Herbert Deaner   COLONOSCOPY WITH PROPOFOL N/A 05/23/2017   Procedure: COLONOSCOPY WITH PROPOFOL;  Surgeon: Anson Fret  F, MD;  Location: [facility] ENDOSCOPY;  Service: Endoscopy;  Laterality: N/A;   ESOPHAGOGASTRODUODENOSCOPY N/A 03/01/2020   Procedure: ESOPHAGOGASTRODUODENOSCOPY (EGD);  Surgeon: Otis Brace, MD;  Location: [facility] ENDOSCOPY;  Service: Gastroenterology;  Laterality: N/A;   ESOPHAGOGASTRODUODENOSCOPY (EGD) WITH PROPOFOL N/A 05/23/2017   Procedure: ESOPHAGOGASTRODUODENOSCOPY (EGD) WITH PROPOFOL;  Surgeon: Wonda Horner, MD;  Location: [facility] ENDOSCOPY;  Service: Endoscopy;  Laterality: N/A;   IR KYPHO LUMBAR INC FX REDUCE BONE BX UNI/BIL CANNULATION INC/IMAGING  06/06/2017   IR RADIOLOGIST EVAL & MGMT   07/01/2017   RADICAL HYSTERECTOMY     REPLACEMENT TOTAL KNEE     11/03/07 right....... 03/29/08 left    Family History  Problem Relation Age of Onset   Arrhythmia Mother        afib   Alzheimer's disease Mother    Stroke Mother    Colon cancer Father    Heart attack Father 73   Diabetes Sister    Arrhythmia Sister        afib   Stroke Maternal Grandmother     Social History   Socioeconomic History   Marital status: Divorced    Spouse name: Not on file   Number of children: 1   Years of education: 12   Highest education level: High school graduate  Occupational History   Occupation: Retired    Fish farm manager: RETIRED  Tobacco Use   Smoking status: Former    Types: Cigarettes    Quit date: 10/08/1970    Years since quitting: 50.6   Smokeless tobacco: Never  Vaping Use   Vaping Use: Never used  Substance and Sexual Activity   Alcohol use: No   Drug use: No   Sexual activity: Not on file  Other Topics Concern   Not on file  Social History Narrative   Lives by herself at home, next to her daughter. She enjoys watching tv in her free time.   Social Determinants of Health   Financial Resource Strain: Low Risk    Difficulty of Paying Living Expenses: Not hard at all  Food Insecurity: No Food Insecurity   Worried About Charity fundraiser in the Last Year: Never true   [place] in the Last Year: Never true  Transportation Needs: No Transportation Needs   Lack of Transportation (Medical): No   Lack of Transportation (Non-Medical): No  Physical Activity: Inactive   Days of Exercise per Week: 0 days   Minutes of Exercise per Session: 0 min  Stress: No Stress Concern Present   Feeling of Stress : Not at all  Social Connections: Socially Isolated   Frequency of Communication with Friends and Family: More than three times a week   Frequency of Social Gatherings with Friends and Family: Twice a week   Attends Religious Services: Never   Marine scientist or  Organizations: No   Attends Music therapist: Never   Marital Status: Divorced  Human resources officer Violence: Not At Risk   Fear of Current or Ex-Partner: No   Emotionally Abused: No   Physically Abused: No   Sexually Abused: No    Outpatient Medications Prior to Visit  Medication Sig Dispense Refill   Multiple Vitamins-Minerals (PRESERVISION AREDS 2 PO) Take by mouth.     acetaminophen (TYLENOL) 500 MG tablet Take 1,000 mg by mouth 3 (three) times daily.     carvedilol (COREG) 12.5 MG tablet TAKE 1 TABLET BY MOUTH TWICE A DAY WITH MEALS 180 tablet 1  Ferrous Sulfate (IRON PO) Take 1 tablet by mouth every other day.     fluconazole (DIFLUCAN) 150 MG tablet Take 1 tablet (150 mg total) by mouth once a week. 4 tablet 0   furosemide (LASIX) 20 MG tablet TAKE 1 TABLET BY MOUTH TWICE A DAY 180 tablet 1   losartan (COZAAR) 25 MG tablet TAKE 1 TABLET BY MOUTH EVERY DAY 90 tablet 3   nystatin (MYCOSTATIN/NYSTOP) powder Apply 1 application topically 3 (three) times daily. 15 g 0   nystatin cream (MYCOSTATIN) Apply to affected area 2 times daily 30 g 0   omeprazole (PRILOSEC) 40 MG capsule TAKE 1 CAPSULE (40 MG TOTAL) BY MOUTH IN THE MORNING AND AT BEDTIME. 180 capsule 0   polyvinyl alcohol-povidone (HYPOTEARS) 1.4-0.6 % ophthalmic solution Place 1-2 drops into both eyes daily as needed (dry eyes).      potassium chloride (KLOR-CON) 10 MEQ tablet TAKE 1 TABLET (10 MEQ TOTAL) BY MOUTH 2 (TWO) TIMES DAILY. 180 tablet 3   atorvastatin (LIPITOR) 40 MG tablet TAKE 1 TABLET BY MOUTH EVERY DAY 90 tablet 6   rivaroxaban (XARELTO) 20 MG TABS tablet Take 1 tablet (20 mg total) by mouth daily with supper. 90 tablet 0   No facility-administered medications prior to visit.    Allergies  Allergen Reactions   Metformin And Related Other (See Comments)    Renal failure   Amlodipine Other (See Comments)    HEADACHE    ROS Review of Systems    Objective:    Physical Exam Constitutional:       Appearance: Normal appearance. She is well-developed.  HENT:     Head: Normocephalic and atraumatic.  Cardiovascular:     Rate and Rhythm: Normal rate.     Heart sounds: Normal heart sounds.     Comments: Irregular rhythm Pulmonary:     Effort: Pulmonary effort is normal.     Breath sounds: Normal breath sounds.  Skin:    General: Skin is warm and dry.  Neurological:     Mental Status: She is alert and oriented to person, place, and time.  Psychiatric:        Behavior: Behavior normal.    BP 136/68   Pulse 90   Ht _0  (1.6 m)   Wt 291 lb 0.6 oz (132 kg)   SpO2 94%   BMI 51.56 kg/m  Wt Readings from Last 3 Encounters:  06/06/21 291 lb 0.6 oz (132 kg)  02/21/21 297 lb 3.2 oz (134.8 kg)  01/31/21 297 lb (134.7 kg)     Health Maintenance Due  Topic Date Due   COVID-19 Vaccine (4 - Booster for Moderna series) 01/09/2021    There are no preventive care reminders to display for this patient.  Lab Results  Component Value Date   TSH 2.31 09/17/2019   Lab Results  Component Value Date   WBC 6.4 06/16/2020   HGB 14.2 06/16/2020   HCT 44.0 06/16/2020   MCV 88.0 06/16/2020   PLT 216 06/16/2020   Lab Results  Component Value Date   NA 139 01/31/2021   K 4.3 01/31/2021   CO2 24 01/31/2021   GLUCOSE 97 01/31/2021   BUN 21 01/31/2021   CREATININE 0.88 01/31/2021   BILITOT 0.3 06/16/2020   ALKPHOS 109 03/30/2020   AST 18 06/16/2020   ALT 16 06/16/2020   PROT 7.4 06/16/2020   ALBUMIN 3.4 (L) 03/30/2020   CALCIUM 9.5 01/31/2021   ANIONGAP 9 03/30/2020   GFR  63.15 05/22/2013   Lab Results  Component Value Date   CHOL 131 01/31/2021   Lab Results  Component Value Date   HDL 47 (L) 01/31/2021   Lab Results  Component Value Date   LDLCALC 67 01/31/2021   Lab Results  Component Value Date   TRIG 90 01/31/2021   Lab Results  Component Value Date   CHOLHDL 2.8 01/31/2021   Lab Results  Component Value Date   HGBA1C 6.1 (A) 06/06/2021       Assessment & Plan:   Problem List Items Addressed This Visit       Cardiovascular and Mediastinum   Essential hypertension    Well controlled. Continue current regimen. Follow up in  6 mo       Relevant Medications   rivaroxaban (XARELTO) 20 MG TABS tablet   atorvastatin (LIPITOR) 40 MG tablet   Atrial fibrillation (HCC)    Stable on current regimen.      Relevant Medications   rivaroxaban (XARELTO) 20 MG TABS tablet   atorvastatin (LIPITOR) 40 MG tablet     Endocrine   Diabetes mellitus type 2 in obese (HCC) - Primary    A1c looks phenomenal today.  Lab Results  Component Value Date   HGBA1C 6.1 (A) 06/06/2021         Relevant Medications   atorvastatin (LIPITOR) 40 MG tablet   Other Relevant Orders   POCT glycosylated hemoglobin (Hb A1C) (Completed)     Nervous and Auditory   Idiopathic progressive polyneuropathy    Significantly affecting both legs.  She has been on gabapentin in the past and even Lyrica but she is been off of it now for about a year she eventually came off because she felt like it really was not helping so we discussed options including retrying 1 of those medications or even considering a trial of Cymbalta which she has never tried.  Or even getting back in with neurology.  Also consider other causes such as spinal stenosis.  She would like to consider the trial of the Cymbalta.  We will go ahead and send to mail order.      Relevant Medications   DULoxetine (CYMBALTA) 30 MG capsule     Other   Severe obesity (BMI >= 40) ([employer])    He is actually doing really well she is down about 6 pounds since I last saw her in May just encouraged her to continue to work on healthy food choices and increasing her activity level.  I know her chronic back pain tends to inhibit her ability to move a lot as well as her bilateral leg pain.      Macular degeneration    Recently started on a supplement.  Followed by her ophthalmologist.      Other Visit  Diagnoses     Need for immunization against influenza       Relevant Orders   Flu Vaccine QUAD High Dose(Fluad) (Completed)      MRI of the lumbar spine from 2019 shows significant facet arthropathy and some major anterior anterolisthesis that could worsen with standing and flexion as well as bulging of the discs she also has stenosis of both lateral recesses and foramen.  Compression on either side is more likely with standing and flexion.  Could consider referral back to Dr. Bertram Millard for injections especially if this was helpful previously.  Meds ordered this encounter  Medications   rivaroxaban (XARELTO) 20 MG TABS tablet    Sig: Take  1 tablet (20 mg total) by mouth daily with supper.    Dispense:  90 tablet    Refill:  3   atorvastatin (LIPITOR) 40 MG tablet    Sig: Take 1 tablet (40 mg total) by mouth daily.    Dispense:  90 tablet    Refill:  3   DULoxetine (CYMBALTA) 30 MG capsule    Sig: Take 1 capsule (30 mg total) by mouth daily.    Dispense:  90 capsule    Refill:  0     Follow-up: Return in about 6 weeks (around 07/17/2021) for New start med for neuropathy med followup   .    Beatrice Lecher, MD

## 2021-06-07 DIAGNOSIS — H3530 Unspecified macular degeneration: Secondary | ICD-10-CM | POA: Insufficient documentation

## 2021-06-07 MED ORDER — DULOXETINE HCL 30 MG PO CPEP: 30.0000 mg | ORAL_CAPSULE | Freq: Every day | ORAL | 0 refills | Status: DC

## 2021-06-07 MED ORDER — RIVAROXABAN 20 MG PO TABS: 20.0000 mg | ORAL_TABLET | Freq: Every day | ORAL | 3 refills | Status: DC

## 2021-06-07 MED ORDER — ATORVASTATIN CALCIUM 40 MG PO TABS: 40.0000 mg | ORAL_TABLET | Freq: Every day | ORAL | 3 refills | Status: DC

## 2021-06-07 NOTE — Assessment & Plan Note (Addendum)
Significantly affecting both legs.  She has been on gabapentin in the past and even Lyrica but she is been off of it now for about a year she eventually came off because she felt like it really was not helping so we discussed options including retrying 1 of those medications or even considering a trial of Cymbalta which she has never tried.  Or even getting back in with neurology.  Also consider other causes such as spinal stenosis.  She would like to consider the trial of the Cymbalta.  We will go ahead and send to mail order.

## 2021-06-07 NOTE — Assessment & Plan Note (Signed)
Recently started on a supplement.  Followed by her ophthalmologist.

## 2021-06-07 NOTE — Assessment & Plan Note (Signed)
Stable on current regimen   

## 2021-06-07 NOTE — Assessment & Plan Note (Signed)
He is actually doing really well she is down about 6 pounds since I last saw her in May just encouraged her to continue to work on healthy food choices and increasing her activity level.  I know her chronic back pain tends to inhibit her ability to move a lot as well as her bilateral leg pain.

## 2021-06-07 NOTE — Assessment & Plan Note (Signed)
Well controlled. Continue current regimen. Follow up in  6 mo  

## 2021-06-07 NOTE — Assessment & Plan Note (Signed)
A1c looks phenomenal today.  Lab Results  Component Value Date   HGBA1C 6.1 (A) 06/06/2021

## 2021-06-08 ENCOUNTER — Other Ambulatory Visit: Payer: Self-pay

## 2021-06-08 MED ORDER — FUROSEMIDE 20 MG PO TABS: 20.0000 mg | ORAL_TABLET | Freq: Two times a day (BID) | ORAL | 2 refills | Status: DC

## 2021-06-29 ENCOUNTER — Other Ambulatory Visit: Payer: Self-pay

## 2021-06-29 DIAGNOSIS — K279 Peptic ulcer, site unspecified, unspecified as acute or chronic, without hemorrhage or perforation: Secondary | ICD-10-CM

## 2021-06-29 MED ORDER — OMEPRAZOLE 40 MG PO CPDR: 40.0000 mg | DELAYED_RELEASE_CAPSULE | Freq: Two times a day (BID) | ORAL | 0 refills | Status: DC

## 2021-06-30 ENCOUNTER — Other Ambulatory Visit: Payer: Self-pay

## 2021-06-30 DIAGNOSIS — I4819 Other persistent atrial fibrillation: Secondary | ICD-10-CM

## 2021-06-30 MED ORDER — CARVEDILOL 12.5 MG PO TABS: 12.5000 mg | ORAL_TABLET | Freq: Two times a day (BID) | ORAL | 3 refills | Status: DC

## 2021-06-30 MED ORDER — POTASSIUM CHLORIDE ER 10 MEQ PO TBCR: EXTENDED_RELEASE_TABLET | ORAL | 3 refills | Status: DC

## 2021-07-10 ENCOUNTER — Other Ambulatory Visit: Payer: Self-pay

## 2021-07-10 DIAGNOSIS — I480 Paroxysmal atrial fibrillation: Secondary | ICD-10-CM

## 2021-07-10 MED ORDER — LOSARTAN POTASSIUM 25 MG PO TABS: 25.0000 mg | ORAL_TABLET | Freq: Every day | ORAL | 3 refills | Status: DC

## 2021-07-18 ENCOUNTER — Ambulatory Visit (INDEPENDENT_AMBULATORY_CARE_PROVIDER_SITE_OTHER): Payer: Medicare Other | Admitting: Family Medicine

## 2021-07-18 ENCOUNTER — Encounter: Payer: Self-pay | Admitting: Family Medicine

## 2021-07-18 VITALS — BP 135/85 | HR 72 | Ht 63.0 in | Wt 288.0 lb

## 2021-07-18 DIAGNOSIS — I700 Atherosclerosis of aorta: Secondary | ICD-10-CM | POA: Diagnosis not present

## 2021-07-18 DIAGNOSIS — G603 Idiopathic progressive neuropathy: Secondary | ICD-10-CM | POA: Diagnosis not present

## 2021-07-18 DIAGNOSIS — I5042 Chronic combined systolic (congestive) and diastolic (congestive) heart failure: Secondary | ICD-10-CM | POA: Diagnosis not present

## 2021-07-18 DIAGNOSIS — R600 Localized edema: Secondary | ICD-10-CM | POA: Diagnosis not present

## 2021-07-18 NOTE — Progress Notes (Signed)
Established Patient Office Visit  Subjective:  Patient ID: Charlotte [name], female    DOB: 30-Apr-1936  Age: 85 y.o. MRN: [id number]  CC:  Chief Complaint  Patient presents with   Follow-up    Pt feels that she is doing well on medication so far stated that it's working slowly but its working.     HPI Charlotte [name] presents for follow-up of progressive polyneuropathy affecting both of her legs.  She decided she wanted to r try Cymbalta.  She had already tried gabapentin and Lyrica in the past.  So far she has actually done really well with the Cymbalta she has not had any negative side effects and she does feel like it is helping her pain by about 25% in her legs particularly she also feels like her swelling in her lower extremities have improved as well she is currently taking a total of 40 mg of Lasix daily.  She is also lost about 3 more pounds since she was last here.  Past Medical History:  Diagnosis Date   Aortic insufficiency    Atrial fibrillation ([place])    a. s/p DCCV 03/2013   CAD (coronary artery disease)    LHC 06/2010: EF 55%, mild plaque in the LAD 20-30%, otherwise normal coronary arteries   CATARACTS    Chronic systolic heart failure ([place])    in setting of AFib;  Echocardiogram 02/2013: Moderate LVH, EF 25-30%, anteroseptal and apical HK, moderate AI, MAC, moderate MR, moderate LAE, mild RAE, PASP 32 => after DCCV f/u Echo 8/14: Moderate LVH, EF 60-65%, normal wall motion, grade 1 diastolic dysfunction, moderate AI, moderate LAE   Complication of anesthesia 2009   nausea and vomitting   GERD (gastroesophageal reflux disease)    HYPERLIPIDEMIA, MILD    HYPERTENSION, BENIGN SYSTEMIC    Irritable bowel syndrome    LBBB (left bundle branch block)    MEDIAL EPICONDYLITIS    NSVT (nonsustained ventricular tachycardia)    during Dob Echo 2011 => normal cath   OSTEOARTHRITIS    OSTEOPENIA    PONV (postoperative nausea and vomiting)    Pre-diabetes    RHINITIS, ALLERGIC     Stroke ([place])    SYNCOPE    VITAMIN D DEFICIENCY     Past Surgical History:  Procedure Laterality Date   BIOPSY  03/01/2020   Procedure: BIOPSY;  Surgeon: Otis Brace, MD;  Location: [place];  Service: Gastroenterology;;   CARDIOVERSION N/A 04/01/2013   Procedure: CARDIOVERSION;  Surgeon: Josue Hector, MD;  Location: [facility] ENDOSCOPY;  Service: Cardiovascular;  Laterality: N/A;   CARDIOVERSION N/A 07/16/2017   Procedure: CARDIOVERSION;  Surgeon: Pixie Casino, MD;  Location: [facility] ENDOSCOPY;  Service: Cardiovascular;  Laterality: N/A;   CARDIOVERSION N/A 07/07/2018   Procedure: CARDIOVERSION;  Surgeon: Pixie Casino, MD;  Location: [place];  Service: Cardiovascular;  Laterality: N/A;   CATARACT EXTRACTION, BILATERAL  2012   Dr. Herbert Deaner   COLONOSCOPY WITH PROPOFOL N/A 05/23/2017   Procedure: COLONOSCOPY WITH PROPOFOL;  Surgeon: Wonda Horner, MD;  Location: [facility] ENDOSCOPY;  Service: Endoscopy;  Laterality: N/A;   ESOPHAGOGASTRODUODENOSCOPY N/A 03/01/2020   Procedure: ESOPHAGOGASTRODUODENOSCOPY (EGD);  Surgeon: Otis Brace, MD;  Location: [facility] ENDOSCOPY;  Service: Gastroenterology;  Laterality: N/A;   ESOPHAGOGASTRODUODENOSCOPY (EGD) WITH PROPOFOL N/A 05/23/2017   Procedure: ESOPHAGOGASTRODUODENOSCOPY (EGD) WITH PROPOFOL;  Surgeon: Wonda Horner, MD;  Location: [facility] ENDOSCOPY;  Service: Endoscopy;  Laterality: N/A;   IR KYPHO LUMBAR INC FX REDUCE BONE BX  UNI/BIL CANNULATION INC/IMAGING  06/06/2017   IR RADIOLOGIST EVAL & MGMT  07/01/2017   RADICAL HYSTERECTOMY     REPLACEMENT TOTAL KNEE     11/03/07 right....... 03/29/08 left    Family History  Problem Relation Age of Onset   Arrhythmia Mother        afib   Alzheimer's disease Mother    Stroke Mother    Colon cancer Father    Heart attack Father 33   Diabetes Sister    Arrhythmia Sister        afib   Stroke Maternal Grandmother     Social History   Socioeconomic History   Marital status: Divorced    Spouse name:  Not on file   Number of children: 1   Years of education: 12   Highest education level: High school graduate  Occupational History   Occupation: Retired    Fish farm manager: RETIRED  Tobacco Use   Smoking status: Former    Types: Cigarettes    Quit date: 10/08/1970    Years since quitting: 50.8   Smokeless tobacco: Never  Vaping Use   Vaping Use: Never used  Substance and Sexual Activity   Alcohol use: No   Drug use: No   Sexual activity: Not on file  Other Topics Concern   Not on file  Social History Narrative   Lives by herself at home, next to her daughter. She enjoys watching tv in her free time.   Social Determinants of Health   Financial Resource Strain: Low Risk    Difficulty of Paying Living Expenses: Not hard at all  Food Insecurity: No Food Insecurity   Worried About Charity fundraiser in the Last Year: Never true   [place] in the Last Year: Never true  Transportation Needs: No Transportation Needs   Lack of Transportation (Medical): No   Lack of Transportation (Non-Medical): No  Physical Activity: Inactive   Days of Exercise per Week: 0 days   Minutes of Exercise per Session: 0 min  Stress: No Stress Concern Present   Feeling of Stress : Not at all  Social Connections: Socially Isolated   Frequency of Communication with Friends and Family: More than three times a week   Frequency of Social Gatherings with Friends and Family: Twice a week   Attends Religious Services: Never   Marine scientist or Organizations: No   Attends Music therapist: Never   Marital Status: Divorced  Human resources officer Violence: Not At Risk   Fear of Current or Ex-Partner: No   Emotionally Abused: No   Physically Abused: No   Sexually Abused: No    Outpatient Medications Prior to Visit  Medication Sig Dispense Refill   acetaminophen (TYLENOL) 500 MG tablet Take 1,000 mg by mouth 3 (three) times daily.     atorvastatin (LIPITOR) 40 MG tablet Take 1 tablet (40 mg  total) by mouth daily. 90 tablet 3   carvedilol (COREG) 12.5 MG tablet Take 1 tablet (12.5 mg total) by mouth 2 (two) times daily with a meal. 180 tablet 3   DULoxetine (CYMBALTA) 30 MG capsule Take 1 capsule (30 mg total) by mouth daily. 90 capsule 0   Ferrous Sulfate (IRON PO) Take 1 tablet by mouth every other day.     furosemide (LASIX) 20 MG tablet Take 1 tablet (20 mg total) by mouth 2 (two) times daily. 180 tablet 2   losartan (COZAAR) 25 MG tablet Take 1 tablet (25  mg total) by mouth daily. 90 tablet 3   Multiple Vitamins-Minerals (SYSTANE ICAPS AREDS2) CAPS Take by mouth.     nystatin (MYCOSTATIN/NYSTOP) powder Apply 1 application topically 3 (three) times daily. 15 g 0   nystatin cream (MYCOSTATIN) Apply to affected area 2 times daily 30 g 0   omeprazole (PRILOSEC) 40 MG capsule Take 1 capsule (40 mg total) by mouth in the morning and at bedtime. 180 capsule 0   polyvinyl alcohol-povidone (HYPOTEARS) 1.4-0.6 % ophthalmic solution Place 1-2 drops into both eyes daily as needed (dry eyes).      potassium chloride (KLOR-CON) 10 MEQ tablet TAKE 1 TABLET (10 MEQ TOTAL) BY MOUTH 2 (TWO) TIMES DAILY. 180 tablet 3   rivaroxaban (XARELTO) 20 MG TABS tablet Take 1 tablet (20 mg total) by mouth daily with supper. 90 tablet 3   fluconazole (DIFLUCAN) 150 MG tablet Take 1 tablet (150 mg total) by mouth once a week. 4 tablet 0   Multiple Vitamins-Minerals (PRESERVISION AREDS 2 PO) Take by mouth.     No facility-administered medications prior to visit.    Allergies  Allergen Reactions   Metformin And Related Other (See Comments)    Renal failure   Amlodipine Other (See Comments)    HEADACHE    ROS Review of Systems    Objective:    Physical Exam Constitutional:      Appearance: Normal appearance. She is well-developed.  HENT:     Head: Normocephalic and atraumatic.  Cardiovascular:     Rate and Rhythm: Normal rate and regular rhythm.     Heart sounds: Normal heart sounds.   Pulmonary:     Effort: Pulmonary effort is normal.     Breath sounds: Normal breath sounds.  Skin:    General: Skin is warm and dry.  Neurological:     Mental Status: She is alert and oriented to person, place, and time.  Psychiatric:        Behavior: Behavior normal.    BP 135/85   Pulse 72   Ht _0  (1.6 m)   Wt 288 lb (130.6 kg)   SpO2 96%   BMI 51.02 kg/m  Wt Readings from Last 3 Encounters:  07/18/21 288 lb (130.6 kg)  06/06/21 291 lb 0.6 oz (132 kg)  02/21/21 297 lb 3.2 oz (134.8 kg)     Health Maintenance Due  Topic Date Due   COVID-19 Vaccine (4 - Booster for Moderna series) 01/09/2021   DEXA SCAN  07/11/2021    There are no preventive care reminders to display for this patient.  Lab Results  Component Value Date   TSH 2.31 09/17/2019   Lab Results  Component Value Date   WBC 6.4 06/16/2020   HGB 14.2 06/16/2020   HCT 44.0 06/16/2020   MCV 88.0 06/16/2020   PLT 216 06/16/2020   Lab Results  Component Value Date   NA 139 01/31/2021   K 4.3 01/31/2021   CO2 24 01/31/2021   GLUCOSE 97 01/31/2021   BUN 21 01/31/2021   CREATININE 0.88 01/31/2021   BILITOT 0.3 06/16/2020   ALKPHOS 109 03/30/2020   AST 18 06/16/2020   ALT 16 06/16/2020   PROT 7.4 06/16/2020   ALBUMIN 3.4 (L) 03/30/2020   CALCIUM 9.5 01/31/2021   ANIONGAP 9 03/30/2020   GFR 63.15 05/22/2013   Lab Results  Component Value Date   CHOL 131 01/31/2021   Lab Results  Component Value Date   HDL 47 (L) 01/31/2021  Lab Results  Component Value Date   LDLCALC 67 01/31/2021   Lab Results  Component Value Date   TRIG 90 01/31/2021   Lab Results  Component Value Date   CHOLHDL 2.8 01/31/2021   Lab Results  Component Value Date   HGBA1C 6.1 (A) 06/06/2021      Assessment & Plan:   Problem List Items Addressed This Visit       Cardiovascular and Mediastinum   Chronic combined systolic and diastolic congestive heart failure ([place])    She still has pitting edema but I  do think the furosemide is keeping it well controlled.  No sign of excess volume overload today.      Arteriosclerosis of abdominal aorta (HCC)     Nervous and Auditory   Idiopathic progressive polyneuropathy - Primary    So far really has had about a 25% improvement with 30 mg of Cymbalta which I think is fantastic we did discuss that it can take up to 8 weeks to fully reach full efficacy.  She is currently on about week 5.  She was to continue her current dose now and finish it out before making a decision about going up or staying at 30 mg she will give [place] a call in about a month and let us know.  She has a 90-day supply on file.  Otherwise I like to see her back in about 3 months to make sure that she is doing well.      Relevant Orders   BASIC METABOLIC PANEL WITH GFR   TSH     Other   Severe obesity (BMI >= 40) (HCC)    She is actually down another 3 pounds and doing really well we did discuss that if she because continues to get some of the weight down I think her lower extremity swelling will improve as well.      Relevant Orders   BASIC METABOLIC PANEL WITH GFR   TSH   Edema    40 mg Lasix seems to be working well to help maintain her current swelling.  She still gets 1+ pitting edema.  But is unable to Nydam compression stockings because she is unable to put them on herself.      Relevant Orders   BASIC METABOLIC PANEL WITH GFR   TSH    No orders of the defined types were placed in this encounter.   Follow-up: Return in about 3 months (around 10/18/2021) for Diabetes follow-up.    Beatrice Lecher, MD

## 2021-07-18 NOTE — Assessment & Plan Note (Signed)
So far really has had about a 25% improvement with 30 mg of Cymbalta which I think is fantastic we did discuss that it can take up to 8 weeks to fully reach full efficacy.  She is currently on about week 5.  She was to continue her current dose now and finish it out before making a decision about going up or staying at 30 mg she will give [place] a call in about a month and let us know.  She has a 90-day supply on file.  Otherwise I like to see her back in about 3 months to make sure that she is doing well.

## 2021-07-18 NOTE — Assessment & Plan Note (Signed)
She still has pitting edema but I do think the furosemide is keeping it well controlled.  No sign of excess volume overload today.

## 2021-07-18 NOTE — Assessment & Plan Note (Signed)
She is actually down another 3 pounds and doing really well we did discuss that if she because continues to get some of the weight down I think her lower extremity swelling will improve as well.

## 2021-07-18 NOTE — Assessment & Plan Note (Signed)
40 mg Lasix seems to be working well to help maintain her current swelling.  She still gets 1+ pitting edema.  But is unable to [place] compression stockings because she is unable to put them on herself.

## 2021-07-19 LAB — BASIC METABOLIC PANEL WITH GFR
BUN/Creatinine Ratio: 32 (calc) — ABNORMAL HIGH (ref 6–22)
BUN: 28 mg/dL — ABNORMAL HIGH (ref 7–25)
CO2: 21 mmol/L (ref 20–32)
Calcium: 9.7 mg/dL (ref 8.6–10.4)
Chloride: 103 mmol/L (ref 98–110)
Creat: 0.88 mg/dL (ref 0.60–0.95)
Glucose, Bld: 84 mg/dL (ref 65–99)
Potassium: 4.3 mmol/L (ref 3.5–5.3)
Sodium: 140 mmol/L (ref 135–146)
eGFR: 65 mL/min/{1.73_m2} (ref >=60)

## 2021-07-19 LAB — TSH: TSH: 2.67 mIU/L (ref 0.40–4.50)

## 2021-07-19 NOTE — Progress Notes (Signed)
Your lab work is within acceptable range and there are no concerning findings.   ?

## 2021-07-28 ENCOUNTER — Other Ambulatory Visit: Payer: Self-pay | Admitting: Cardiology

## 2021-07-28 DIAGNOSIS — I480 Paroxysmal atrial fibrillation: Secondary | ICD-10-CM

## 2021-08-02 ENCOUNTER — Other Ambulatory Visit: Payer: Self-pay | Admitting: Cardiology

## 2021-08-02 DIAGNOSIS — I480 Paroxysmal atrial fibrillation: Secondary | ICD-10-CM

## 2021-08-03 ENCOUNTER — Other Ambulatory Visit: Payer: Self-pay | Admitting: Family Medicine

## 2021-08-03 DIAGNOSIS — K279 Peptic ulcer, site unspecified, unspecified as acute or chronic, without hemorrhage or perforation: Secondary | ICD-10-CM

## 2021-08-08 ENCOUNTER — Other Ambulatory Visit: Payer: Self-pay | Admitting: Family Medicine

## 2021-08-08 DIAGNOSIS — G603 Idiopathic progressive neuropathy: Secondary | ICD-10-CM

## 2021-08-18 DIAGNOSIS — G4731 Primary central sleep apnea: Secondary | ICD-10-CM | POA: Diagnosis not present

## 2021-08-30 NOTE — Progress Notes (Signed)
HPI: Follow-up atrial fibrillation. Catheterization September 2011 showed mild plaque in the LAD at 20 to 30%. Previously had a cardiomyopathy which did improve. Nuclear study August 2019 showed ejection fraction 48% with fixed anteroseptal defect felt secondary to left bundle branch block.  No ischemia. Echocardiogram repeated December 2020 and showed normal LV function, mild left ventricular hypertrophy, grade 1 diastolic dysfunction, mild aortic insufficiency. Patient admitted May 2021 with GI bleed and syncope. Initial hemoglobin 6.9.  EGD showed multiple clean-based superficial gastric ulcers. She was transfused 2 units packed red blood cells and treated with IV Protonix.  Xarelto was held. Also had transient renal insufficiency with creatinine of 1.84 which improved prior to discharge.  Since last seen patient has some dyspnea on exertion.  No orthopnea or PND.  Pedal edema is controlled with Lasix.  She denies chest pain, palpitations or syncope.  No bleeding.  Current Outpatient Medications  Medication Sig Dispense Refill   acetaminophen (TYLENOL) 500 MG tablet Take 1,000 mg by mouth 3 (three) times daily.     atorvastatin (LIPITOR) 40 MG tablet Take 1 tablet (40 mg total) by mouth daily. 90 tablet 3   carvedilol (COREG) 12.5 MG tablet Take 1 tablet (12.5 mg total) by mouth 2 (two) times daily with a meal. 180 tablet 3   DULoxetine (CYMBALTA) 30 MG capsule TAKE 1 CAPSULE BY MOUTH  DAILY 90 capsule 1   Ferrous Sulfate (IRON PO) Take 1 tablet by mouth every other day.     furosemide (LASIX) 20 MG tablet Take 1 tablet (20 mg total) by mouth 2 (two) times daily. 180 tablet 2   losartan (COZAAR) 25 MG tablet TAKE 1 TABLET BY MOUTH EVERY DAY 90 tablet 3   Multiple Vitamins-Minerals (SYSTANE ICAPS AREDS2) CAPS Take by mouth.     nystatin (MYCOSTATIN/NYSTOP) powder Apply 1 application topically 3 (three) times daily. 15 g 0   nystatin cream (MYCOSTATIN) Apply to affected area 2 times daily 30  g 0   omeprazole (PRILOSEC) 40 MG capsule TAKE 1 CAPSULE BY MOUTH  TWICE DAILY IN THE MORNING  AND AT BEDTIME 180 capsule 3   polyvinyl alcohol-povidone (HYPOTEARS) 1.4-0.6 % ophthalmic solution Place 1-2 drops into both eyes daily as needed (dry eyes).      potassium chloride (KLOR-CON) 10 MEQ tablet TAKE 1 TABLET (10 MEQ TOTAL) BY MOUTH 2 (TWO) TIMES DAILY. 180 tablet 3   rivaroxaban (XARELTO) 20 MG TABS tablet Take 1 tablet (20 mg total) by mouth daily with supper. 90 tablet 3   No current facility-administered medications for this visit.     Past Medical History:  Diagnosis Date   Aortic insufficiency    Atrial fibrillation ([place])    a. s/p DCCV 03/2013   CAD (coronary artery disease)    LHC 06/2010: EF 55%, mild plaque in the LAD 20-30%, otherwise normal coronary arteries   CATARACTS    Chronic systolic heart failure ([place])    in setting of AFib;  Echocardiogram 02/2013: Moderate LVH, EF 25-30%, anteroseptal and apical HK, moderate AI, MAC, moderate MR, moderate LAE, mild RAE, PASP 32 => after DCCV f/u Echo 8/14: Moderate LVH, EF 60-65%, normal wall motion, grade 1 diastolic dysfunction, moderate AI, moderate LAE   Complication of anesthesia 2009   nausea and vomitting   GERD (gastroesophageal reflux disease)    HYPERLIPIDEMIA, MILD    HYPERTENSION, BENIGN SYSTEMIC    Irritable bowel syndrome    LBBB (left bundle branch block)  MEDIAL EPICONDYLITIS    NSVT (nonsustained ventricular tachycardia)    during Dob Echo 2011 => normal cath   OSTEOARTHRITIS    OSTEOPENIA    PONV (postoperative nausea and vomiting)    Pre-diabetes    RHINITIS, ALLERGIC    Stroke ([place])    SYNCOPE    VITAMIN D DEFICIENCY     Past Surgical History:  Procedure Laterality Date   BIOPSY  03/01/2020   Procedure: BIOPSY;  Surgeon: Otis Brace, MD;  Location: [place];  Service: Gastroenterology;;   CARDIOVERSION N/A 04/01/2013   Procedure: CARDIOVERSION;  Surgeon: Josue Hector, MD;  Location:  [facility] ENDOSCOPY;  Service: Cardiovascular;  Laterality: N/A;   CARDIOVERSION N/A 07/16/2017   Procedure: CARDIOVERSION;  Surgeon: Pixie Casino, MD;  Location: [facility] ENDOSCOPY;  Service: Cardiovascular;  Laterality: N/A;   CARDIOVERSION N/A 07/07/2018   Procedure: CARDIOVERSION;  Surgeon: Pixie Casino, MD;  Location: [place];  Service: Cardiovascular;  Laterality: N/A;   CATARACT EXTRACTION, BILATERAL  2012   Dr. Herbert Deaner   COLONOSCOPY WITH PROPOFOL N/A 05/23/2017   Procedure: COLONOSCOPY WITH PROPOFOL;  Surgeon: Wonda Horner, MD;  Location: [facility] ENDOSCOPY;  Service: Endoscopy;  Laterality: N/A;   ESOPHAGOGASTRODUODENOSCOPY N/A 03/01/2020   Procedure: ESOPHAGOGASTRODUODENOSCOPY (EGD);  Surgeon: Otis Brace, MD;  Location: [facility] ENDOSCOPY;  Service: Gastroenterology;  Laterality: N/A;   ESOPHAGOGASTRODUODENOSCOPY (EGD) WITH PROPOFOL N/A 05/23/2017   Procedure: ESOPHAGOGASTRODUODENOSCOPY (EGD) WITH PROPOFOL;  Surgeon: Wonda Horner, MD;  Location: [facility] ENDOSCOPY;  Service: Endoscopy;  Laterality: N/A;   IR KYPHO LUMBAR INC FX REDUCE BONE BX UNI/BIL CANNULATION INC/IMAGING  06/06/2017   IR RADIOLOGIST EVAL & MGMT  07/01/2017   RADICAL HYSTERECTOMY     REPLACEMENT TOTAL KNEE     11/03/07 right....... 03/29/08 left    Social History   Socioeconomic History   Marital status: Divorced    Spouse name: Not on file   Number of children: 1   Years of education: 12   Highest education level: High school graduate  Occupational History   Occupation: Retired    Fish farm manager: RETIRED  Tobacco Use   Smoking status: Former    Types: Cigarettes    Quit date: 10/08/1970    Years since quitting: 50.9   Smokeless tobacco: Never  Vaping Use   Vaping Use: Never used  Substance and Sexual Activity   Alcohol use: No   Drug use: No   Sexual activity: Not on file  Other Topics Concern   Not on file  Social History Narrative   Lives by herself at home, next to her daughter. She enjoys watching tv in her free  time.   Social Determinants of Health   Financial Resource Strain: Low Risk    Difficulty of Paying Living Expenses: Not hard at all  Food Insecurity: No Food Insecurity   Worried About Charity fundraiser in the Last Year: Never true   [place] in the Last Year: Never true  Transportation Needs: No Transportation Needs   Lack of Transportation (Medical): No   Lack of Transportation (Non-Medical): No  Physical Activity: Inactive   Days of Exercise per Week: 0 days   Minutes of Exercise per Session: 0 min  Stress: No Stress Concern Present   Feeling of Stress : Not at all  Social Connections: Socially Isolated   Frequency of Communication with Friends and Family: More than three times a week   Frequency of Social Gatherings with Friends and Family: Twice a  week   Attends Religious Services: Never   Active Member of Clubs or Organizations: No   Attends Music therapist: Never   Marital Status: Divorced  Human resources officer Violence: Not At Risk   Fear of Current or Ex-Partner: No   Emotionally Abused: No   Physically Abused: No   Sexually Abused: No    Family History  Problem Relation Age of Onset   Arrhythmia Mother        afib   Alzheimer's disease Mother    Stroke Mother    Colon cancer Father    Heart attack Father 65   Diabetes Sister    Arrhythmia Sister        afib   Stroke Maternal Grandmother     ROS: no fevers or chills, productive cough, hemoptysis, dysphasia, odynophagia, melena, hematochezia, dysuria, hematuria, rash, seizure activity, orthopnea, PND, claudication. Remaining systems are negative.  Physical Exam: Well-developed obese in no acute distress.  Skin is warm and dry.  HEENT is normal.  Neck is supple.  Chest is clear to auscultation with normal expansion.  Cardiovascular exam is tachycardic and irregular Abdominal exam nontender or distended. No masses palpated. Extremities show no edema. neuro grossly intact  ECG-atrial  fibrillation at a rate of 124, left bundle branch block.  Personally reviewed  A/P  1 permanent atrial fibrillation-patient's heart rate is elevated today but she feels as though she had to walk a long distance.  We will continue carvedilol at present dose.  I will arrange a 3-day Zio patch to make sure that her heart rate is adequately controlled.  If not we will advance carvedilol.  Continue Xarelto.  She will require lifelong anticoagulation given history of CVA.  2 hypertension-blood pressure is controlled.  Continue present medications.  3 hyperlipidemia-continue statin.  4 coronary artery disease-continue statin.  No aspirin given need for Xarelto.  5 aortic insufficiency-mild on most recent echocardiogram.  6 prior CVA-continue Xarelto.  7 lower extremity edema-controlled.  Continue diuretic at present dose.  Kirk Ruths, MD

## 2021-09-11 ENCOUNTER — Ambulatory Visit: Payer: Medicare Other | Admitting: Cardiology

## 2021-09-11 ENCOUNTER — Other Ambulatory Visit: Payer: Self-pay

## 2021-09-11 ENCOUNTER — Encounter: Payer: Self-pay | Admitting: Cardiology

## 2021-09-11 ENCOUNTER — Ambulatory Visit (INDEPENDENT_AMBULATORY_CARE_PROVIDER_SITE_OTHER): Payer: Medicare Other

## 2021-09-11 VITALS — BP 126/80 | HR 124 | Ht 63.0 in | Wt 286.8 lb

## 2021-09-11 DIAGNOSIS — I10 Essential (primary) hypertension: Secondary | ICD-10-CM

## 2021-09-11 DIAGNOSIS — I2510 Atherosclerotic heart disease of native coronary artery without angina pectoris: Secondary | ICD-10-CM | POA: Diagnosis not present

## 2021-09-11 DIAGNOSIS — E785 Hyperlipidemia, unspecified: Secondary | ICD-10-CM

## 2021-09-11 DIAGNOSIS — I4821 Permanent atrial fibrillation: Secondary | ICD-10-CM

## 2021-09-11 NOTE — Patient Instructions (Signed)
Testing/Procedures:  [facility]- Long Term Monitor Instructions  Your physician has requested you Geralds a ZIO patch monitor for 3 days.  This is a single patch monitor. [employer] supplies one patch monitor per enrollment. Additional stickers are not available. Please do not apply patch if you will be having a Nuclear Stress Test,  Echocardiogram, Cardiac CT, MRI, or Chest Xray during the period you would be wearing the  monitor. The patch cannot be worn during these tests. You cannot remove and re-apply the  ZIO XT patch monitor.  Your ZIO patch monitor will be mailed 3 day USPS to your address on file. It may take 3-5 days  to receive your monitor after you have been enrolled.  Once you have received your monitor, please review the enclosed instructions. Your monitor  has already been registered assigning a specific monitor serial # to you.  Billing and Patient Assistance Program Information  We have supplied [employer] with any of your insurance information on file for billing purposes. [employer] offers a sliding scale Patient Assistance Program for patients that do not have  insurance, or whose insurance does not completely cover the cost of the ZIO monitor.  You must apply for the Patient Assistance Program to qualify for this discounted rate.  To apply, please call [employer] at [phone], select option 4, select option 2, ask to apply for  Patient Assistance Program. Theodore Demark will ask your household income, and how many people  are in your household. They will quote your out-of-pocket cost based on that information.  [employer] will also be able to set up a 46-month interest-free payment plan if needed.  Applying the monitor   Shave hair from upper left chest.  Hold abrader disc by orange tab. Rub abrader in 40 strokes over the upper left chest as  indicated in your monitor instructions.  Clean area with 4 enclosed alcohol pads. Let dry.  Apply patch as indicated in monitor instructions.  Patch will be placed under collarbone on left  side of chest with arrow pointing upward.  Rub patch adhesive wings for 2 minutes. Remove white label marked "1". Remove the white  label marked "2". Rub patch adhesive wings for 2 additional minutes.  While looking in a mirror, press and release button in center of patch. A small green light will  flash 3-4 times. This will be your only indicator that the monitor has been turned on.  Do not shower for the first 24 hours. You may shower after the first 24 hours.  Press the button if you feel a symptom. You will hear a small click. Record Date, Time and  Symptom in the Patient Logbook.  When you are ready to remove the patch, follow instructions on the last 2 pages of Patient  Logbook. Stick patch monitor onto the last page of Patient Logbook.  Place Patient Logbook in the blue and white box. Use locking tab on box and tape box closed  securely. The blue and white box has prepaid postage on it. Please place it in the mailbox as  soon as possible. Your physician should have your test results approximately 7 days after the  monitor has been [facility]  Call IMonsonat [phone] you have questions regarding  your ZIO XT patch monitor. Call them immediately if you see an orange light blinking on your  monitor.  If your monitor falls off in less than 4 days, contact our Monitor department at [phone]  If your monitor becomes loose or falls off after 4 days call [employer] at [phone] for  suggestions on securing your monitor    Follow-Up: At [facility], you and your health needs are our priority.  As part of our continuing mission to provide you with exceptional heart care, we have created designated Provider Care Teams.  These Care Teams include your primary Cardiologist (physician) and Advanced Practice Providers (APPs -  Physician Assistants and Nurse Practitioners) who all work together to  provide you with the care you need, when you need it.  We recommend signing up for the patient portal called "MyChart".  Sign up information is provided on this After Visit Summary.  MyChart is used to connect with patients for Virtual Visits (Telemedicine).  Patients are able to view lab/test results, encounter notes, upcoming appointments, etc.  Non-urgent messages can be sent to your provider as well.   To learn more about what you can do with MyChart, go to [email or web address].    Your next appointment:   6 month(s)  The format for your next appointment:   In Person  Provider:   Olga Millers MD

## 2021-09-11 NOTE — Progress Notes (Unsigned)
Enrolled patient for a 3 day ZIo XT monitor to be mailed to patient home

## 2021-09-17 DIAGNOSIS — I4821 Permanent atrial fibrillation: Secondary | ICD-10-CM | POA: Diagnosis not present

## 2021-09-27 DIAGNOSIS — I4821 Permanent atrial fibrillation: Secondary | ICD-10-CM | POA: Diagnosis not present

## 2021-09-28 ENCOUNTER — Telehealth: Payer: Self-pay | Admitting: *Deleted

## 2021-09-28 DIAGNOSIS — I4819 Other persistent atrial fibrillation: Secondary | ICD-10-CM

## 2021-09-28 MED ORDER — CARVEDILOL 25 MG PO TABS: 25.0000 mg | ORAL_TABLET | Freq: Two times a day (BID) | ORAL | 3 refills | Status: DC

## 2021-09-28 NOTE — Telephone Encounter (Signed)
-----   Message from Lewayne Bunting, MD sent at 09/27/2021  1:35 PM EST ----- HR increased at times; DC losartan; increase coreg to 25 mg BID and follow HR and BP Olga Millers

## 2021-09-28 NOTE — Telephone Encounter (Signed)
Patient voiced understanding and repeated medication changes. New script sent to the pharmacy

## 2021-09-29 MED ORDER — CARVEDILOL 25 MG PO TABS: 25.0000 mg | ORAL_TABLET | Freq: Two times a day (BID) | ORAL | 3 refills | Status: DC

## 2021-09-29 NOTE — Telephone Encounter (Addendum)
Spoke with pt daughter, aware of medication changes.

## 2021-09-29 NOTE — Addendum Note (Signed)
Addended by: Freddi Starr on: 09/29/2021 02:50 PM   Modules accepted: Orders

## 2021-09-29 NOTE — Telephone Encounter (Signed)
° °  Pt's daughter calling back, she said she's the one manages pt's pill box and would like to speak with nurse again about her meds changed

## 2021-10-04 ENCOUNTER — Encounter: Payer: Self-pay | Admitting: Neurology

## 2021-10-04 ENCOUNTER — Telehealth: Payer: Self-pay | Admitting: Neurology

## 2021-10-04 NOTE — Telephone Encounter (Signed)
Pt's daughter, Shareka Casale have questions concerning her mother's BIPAP machine. Would like a call from the nurse.

## 2021-10-04 NOTE — Telephone Encounter (Signed)
Called the daughter back.  She states that anytime her mom puts on the mask the air blows so hard that it is hard for the patient to tolerate using.  She was asking if her pressures needed to be adjusted or if have a ramp time set.  In reviewing the download on the days that the patient has used the most it indicates severe leaks.  She already has a 25-minute ramp time set on the machine.  Patient is established with [employer] and they typically only go to the patient's house 1 time at set up.  They have sent out different mask but none of these seem to be fitting appropriately.  She has not had anyone actually go out and do a mask refit.  I advised the daughter will reach out to the company to see if they can have someone go out to the home to complete a mask refit.  Informed her that if they do not have that capability then we would need to look at potentially transfer of care so that the patient can go to a local office and have someone do a mask refit. I will see what the manager of [employer] says.  The manager states he will have someone get in touch with the daughter to discuss setting up a time to help with the patient.

## 2021-10-24 ENCOUNTER — Ambulatory Visit: Payer: Medicare Other | Admitting: Family Medicine

## 2021-10-31 ENCOUNTER — Encounter: Payer: Self-pay | Admitting: Family Medicine

## 2021-10-31 ENCOUNTER — Ambulatory Visit (INDEPENDENT_AMBULATORY_CARE_PROVIDER_SITE_OTHER): Payer: Medicare Other | Admitting: Family Medicine

## 2021-10-31 ENCOUNTER — Other Ambulatory Visit: Payer: Self-pay

## 2021-10-31 VITALS — BP 127/66 | HR 65 | Resp 18 | Ht 63.0 in | Wt 285.0 lb

## 2021-10-31 DIAGNOSIS — G4730 Sleep apnea, unspecified: Secondary | ICD-10-CM | POA: Diagnosis not present

## 2021-10-31 DIAGNOSIS — B372 Candidiasis of skin and nail: Secondary | ICD-10-CM | POA: Insufficient documentation

## 2021-10-31 DIAGNOSIS — I5042 Chronic combined systolic (congestive) and diastolic (congestive) heart failure: Secondary | ICD-10-CM | POA: Diagnosis not present

## 2021-10-31 DIAGNOSIS — I10 Essential (primary) hypertension: Secondary | ICD-10-CM

## 2021-10-31 DIAGNOSIS — E669 Obesity, unspecified: Secondary | ICD-10-CM | POA: Diagnosis not present

## 2021-10-31 DIAGNOSIS — E1169 Type 2 diabetes mellitus with other specified complication: Secondary | ICD-10-CM | POA: Diagnosis not present

## 2021-10-31 DIAGNOSIS — R0602 Shortness of breath: Secondary | ICD-10-CM | POA: Diagnosis not present

## 2021-10-31 LAB — POCT GLYCOSYLATED HEMOGLOBIN (HGB A1C): Hemoglobin A1C: 6.1 % — AB (ref 4.0–5.6)

## 2021-10-31 MED ORDER — FLUCONAZOLE 150 MG PO TABS: 150.0000 mg | ORAL_TABLET | ORAL | 0 refills | Status: DC

## 2021-10-31 MED ORDER — NYSTATIN 100000 UNIT/GM EX POWD: 1.0000 "application " | Freq: Three times a day (TID) | CUTANEOUS | 3 refills | Status: DC

## 2021-10-31 MED ORDER — NYSTATIN 100000 UNIT/GM EX CREA: TOPICAL_CREAM | CUTANEOUS | 4 refills | Status: DC

## 2021-10-31 NOTE — Assessment & Plan Note (Signed)
Pressure well controlled today continue current regimen.

## 2021-10-31 NOTE — Progress Notes (Signed)
Established Patient Office Visit  Subjective:  Patient ID: Charlotte [name], female    DOB: 1936/08/05  Age: 86 y.o. MRN: [id number]  CC:  Chief Complaint  Patient presents with   Diabetes    Follow up     HPI Charlotte [name] presents for   Diabetes - no hypoglycemic events. No wounds or sores that are not healing well. No increased thirst or urination. Checking glucose at  home. Taking medications as prescribed without any side effects.  She also saw cardiology and had a heart monitor performed December.  After  they doubled her carvedilol and took her off of losartan.  She denies any change in lower extremity swelling.  In fact her daughter says her swelling has been pretty well controlled this year.  Her daughter has noticed that she is a little bit more heavy breathing and it takes her little bit longer to expire her air.  She has not had any cough or cold symptoms.  He also wanted to discuss the yeast infection that she keeps getting underneath her breasts and in the pannus folds.  She was seen at urgent care and was treated with Diflucan weekly for 4 weeks as well as topical nystatin cream and powder.  Her daughter says that it actually looks the best that it has in months.  Charlotte [name] feels that the Cymbalta is helping with her neuropathy.  And wants to continue with her current dose.  She also uses Tylenol.  Past Medical History:  Diagnosis Date   Aortic insufficiency    Atrial fibrillation ([place])    a. s/p DCCV 03/2013   CAD (coronary artery disease)    LHC 06/2010: EF 55%, mild plaque in the LAD 20-30%, otherwise normal coronary arteries   CATARACTS    Chronic systolic heart failure ([place])    in setting of AFib;  Echocardiogram 02/2013: Moderate LVH, EF 25-30%, anteroseptal and apical HK, moderate AI, MAC, moderate MR, moderate LAE, mild RAE, PASP 32 => after DCCV f/u Echo 8/14: Moderate LVH, EF 60-65%, normal wall motion, grade 1 diastolic dysfunction, moderate AI, moderate LAE    Complication of anesthesia 2009   nausea and vomitting   GERD (gastroesophageal reflux disease)    HYPERLIPIDEMIA, MILD    HYPERTENSION, BENIGN SYSTEMIC    Irritable bowel syndrome    LBBB (left bundle branch block)    MEDIAL EPICONDYLITIS    NSVT (nonsustained ventricular tachycardia)    during Dob Echo 2011 => normal cath   OSTEOARTHRITIS    OSTEOPENIA    PONV (postoperative nausea and vomiting)    Pre-diabetes    RHINITIS, ALLERGIC    Stroke ([place])    SYNCOPE    VITAMIN D DEFICIENCY     Past Surgical History:  Procedure Laterality Date   BIOPSY  03/01/2020   Procedure: BIOPSY;  Surgeon: Otis Brace, MD;  Location: [place];  Service: Gastroenterology;;   CARDIOVERSION N/A 04/01/2013   Procedure: CARDIOVERSION;  Surgeon: Josue Hector, MD;  Location: [facility] ENDOSCOPY;  Service: Cardiovascular;  Laterality: N/A;   CARDIOVERSION N/A 07/16/2017   Procedure: CARDIOVERSION;  Surgeon: Pixie Casino, MD;  Location: [facility] ENDOSCOPY;  Service: Cardiovascular;  Laterality: N/A;   CARDIOVERSION N/A 07/07/2018   Procedure: CARDIOVERSION;  Surgeon: Pixie Casino, MD;  Location: [place];  Service: Cardiovascular;  Laterality: N/A;   CATARACT EXTRACTION, BILATERAL  2012   Dr. Herbert Deaner   COLONOSCOPY WITH PROPOFOL N/A 05/23/2017   Procedure: COLONOSCOPY WITH PROPOFOL;  Surgeon:  Wonda Horner, MD;  Location: [facility] ENDOSCOPY;  Service: Endoscopy;  Laterality: N/A;   ESOPHAGOGASTRODUODENOSCOPY N/A 03/01/2020   Procedure: ESOPHAGOGASTRODUODENOSCOPY (EGD);  Surgeon: Otis Brace, MD;  Location[facility] ENDOSCOPY;  Service: Gastroenterology;  Laterality: N/A;   ESOPHAGOGASTRODUODENOSCOPY (EGD) WITH PROPOFOL N/A 05/23/2017   Procedure: ESOPHAGOGASTRODUODENOSCOPY (EGD) WITH PROPOFOL;  Surgeon: Wonda Horner, MD;  Location: [facility] ENDOSCOPY;  Service: Endoscopy;  Laterality: N/A;   IR KYPHO LUMBAR INC FX REDUCE BONE BX UNI/BIL CANNULATION INC/IMAGING  06/06/2017   IR RADIOLOGIST EVAL & MGMT  07/01/2017    RADICAL HYSTERECTOMY     REPLACEMENT TOTAL KNEE     11/03/07 right....... 03/29/08 left    Family History  Problem Relation Age of Onset   Arrhythmia Mother        afib   Alzheimer's disease Mother    Stroke Mother    Colon cancer Father    Heart attack Father 61   Diabetes Sister    Arrhythmia Sister        afib   Stroke Maternal Grandmother     Social History   Socioeconomic History   Marital status: Divorced    Spouse name: Not on file   Number of children: 1   Years of education: 12   Highest education level: High school graduate  Occupational History   Occupation: Retired    Fish farm manager: RETIRED  Tobacco Use   Smoking status: Former    Types: Cigarettes    Quit date: 10/08/1970    Years since quitting: 51.0   Smokeless tobacco: Never  Vaping Use   Vaping Use: Never used  Substance and Sexual Activity   Alcohol use: No   Drug use: No   Sexual activity: Not on file  Other Topics Concern   Not on file  Social History Narrative   Lives by herself at home, next to her daughter. She enjoys watching tv in her free time.   Social Determinants of Health   Financial Resource Strain: Low Risk    Difficulty of Paying Living Expenses: Not hard at all  Food Insecurity: No Food Insecurity   Worried About Charity fundraiser in the Last Year: Never true   [place] in the Last Year: Never true  Transportation Needs: No Transportation Needs   Lack of Transportation (Medical): No   Lack of Transportation (Non-Medical): No  Physical Activity: Inactive   Days of Exercise per Week: 0 days   Minutes of Exercise per Session: 0 min  Stress: No Stress Concern Present   Feeling of Stress : Not at all  Social Connections: Socially Isolated   Frequency of Communication with Friends and Family: More than three times a week   Frequency of Social Gatherings with Friends and Family: Twice a week   Attends Religious Services: Never   Marine scientist or Organizations: No    Attends Music therapist: Never   Marital Status: Divorced  Human resources officer Violence: Not At Risk   Fear of Current or Ex-Partner: No   Emotionally Abused: No   Physically Abused: No   Sexually Abused: No    Outpatient Medications Prior to Visit  Medication Sig Dispense Refill   acetaminophen (TYLENOL) 500 MG tablet Take 1,000 mg by mouth 3 (three) times daily.     atorvastatin (LIPITOR) 40 MG tablet Take 1 tablet (40 mg total) by mouth daily. 90 tablet 3   carvedilol (COREG) 25 MG tablet Take 1 tablet (25 mg total)  by mouth 2 (two) times daily with a meal. 180 tablet 3   DULoxetine (CYMBALTA) 30 MG capsule TAKE 1 CAPSULE BY MOUTH  DAILY 90 capsule 1   Ferrous Sulfate (IRON PO) Take 1 tablet by mouth every other day.     furosemide (LASIX) 20 MG tablet Take 1 tablet (20 mg total) by mouth 2 (two) times daily. 180 tablet 2   Multiple Vitamins-Minerals (SYSTANE ICAPS AREDS2) CAPS Take by mouth.     omeprazole (PRILOSEC) 40 MG capsule TAKE 1 CAPSULE BY MOUTH  TWICE DAILY IN THE MORNING  AND AT BEDTIME 180 capsule 3   polyvinyl alcohol-povidone (HYPOTEARS) 1.4-0.6 % ophthalmic solution Place 1-2 drops into both eyes daily as needed (dry eyes).      potassium chloride (KLOR-CON) 10 MEQ tablet TAKE 1 TABLET (10 MEQ TOTAL) BY MOUTH 2 (TWO) TIMES DAILY. 180 tablet 3   rivaroxaban (XARELTO) 20 MG TABS tablet Take 1 tablet (20 mg total) by mouth daily with supper. 90 tablet 3   nystatin (MYCOSTATIN/NYSTOP) powder Apply 1 application topically 3 (three) times daily. 15 g 0   nystatin cream (MYCOSTATIN) Apply to affected area 2 times daily 30 g 0   No facility-administered medications prior to visit.    Allergies  Allergen Reactions   Metformin And Related Other (See Comments)    Renal failure   Amlodipine Other (See Comments)    HEADACHE    ROS Review of Systems    Objective:    Physical Exam Constitutional:      Appearance: Normal appearance. She is well-developed.   HENT:     Head: Normocephalic and atraumatic.  Cardiovascular:     Rate and Rhythm: Normal rate and regular rhythm.     Heart sounds: Normal heart sounds.  Pulmonary:     Effort: Pulmonary effort is normal.     Breath sounds: Normal breath sounds.  Musculoskeletal:     Comments: 1+ pitting edema around her ankles bilaterally but no significant swelling mid lower leg or over the feet.  Skin:    General: Skin is warm and dry.  Neurological:     Mental Status: She is alert and oriented to person, place, and time.  Psychiatric:        Behavior: Behavior normal.    BP 127/66    Pulse 65    Resp 18    Ht _0  (1.6 m)    Wt 285 lb (129.3 kg)    SpO2 95%    BMI 50.49 kg/m  Wt Readings from Last 3 Encounters:  10/31/21 285 lb (129.3 kg)  09/11/21 286 lb 12.8 oz (130.1 kg)  07/18/21 288 lb (130.6 kg)     There are no preventive care reminders to display for this patient.   There are no preventive care reminders to display for this patient.  Lab Results  Component Value Date   TSH 2.67 07/18/2021   Lab Results  Component Value Date   WBC 6.4 06/16/2020   HGB 14.2 06/16/2020   HCT 44.0 06/16/2020   MCV 88.0 06/16/2020   PLT 216 06/16/2020   Lab Results  Component Value Date   NA 140 07/18/2021   K 4.3 07/18/2021   CO2 21 07/18/2021   GLUCOSE 84 07/18/2021   BUN 28 (H) 07/18/2021   CREATININE 0.88 07/18/2021   BILITOT 0.3 06/16/2020   ALKPHOS 109 03/30/2020   AST 18 06/16/2020   ALT 16 06/16/2020   PROT 7.4 06/16/2020   ALBUMIN 3.4 (  L) 03/30/2020   CALCIUM 9.7 07/18/2021   ANIONGAP 9 03/30/2020   EGFR 65 07/18/2021   GFR 63.15 05/22/2013   Lab Results  Component Value Date   CHOL 131 01/31/2021   Lab Results  Component Value Date   HDL 47 (L) 01/31/2021   Lab Results  Component Value Date   LDLCALC 67 01/31/2021   Lab Results  Component Value Date   TRIG 90 01/31/2021   Lab Results  Component Value Date   CHOLHDL 2.8 01/31/2021   Lab Results   Component Value Date   HGBA1C 6.1 (A) 10/31/2021      Assessment & Plan:   Problem List Items Addressed This Visit       Cardiovascular and Mediastinum   Essential hypertension    Pressure well controlled today continue current regimen.      Chronic combined systolic and diastolic congestive heart failure (HCC)    No sign of volume overload on exam today.        Respiratory   Severe sleep apnea    Not actively treating her obstructive sleep apnea.  She does have a follow-up coming up with neurology next month and will discuss further with them.  She may be a candidate for at least some nocturnal oxygen which we discussed does not actually correct the apnea apnea but may help keep her from being hypoxic overnight.        Endocrine   Diabetes mellitus type 2 in obese (HCC) - Primary    A1c looks fantastic today at 6.1.  She is currently diet controlled.  She is on a statin.  She was taken off of the ARB.  Lab Results  Component Value Date   HGBA1C 6.1 (A) 10/31/2021         Relevant Orders   POCT HgB A1C (Completed)     Musculoskeletal and Integument   Yeast infection of the skin    Unfortunately she continues to get recurrent yeast infections we will go ahead and refill the nystatin powder and cream.  I will also send over prescription for alternating days of Diflucan x3 tabs for a new breakout episode if it occurs again.      Relevant Medications   nystatin (MYCOSTATIN/NYSTOP) powder   nystatin cream (MYCOSTATIN)   fluconazole (DIFLUCAN) 150 MG tablet     Other   Severe obesity (BMI >= 40) (HCC)    Do think that her obesity is contributing to her shortness of breath as well as her obstructive sleep apnea.  She has not been consistently wearing her CPAP she has had significant difficulty with it      DYSPNEA    She still has increased work of breathing today on exam but is not complaining of increased shortness of breath.  That her daughter has noted she has a  little bit more prolonged expiration.  Recommend spirometry we will schedule her this spring.       Meds ordered this encounter  Medications   nystatin (MYCOSTATIN/NYSTOP) powder    Sig: Apply 1 application topically 3 (three) times daily.    Dispense:  60 g    Refill:  3   nystatin cream (MYCOSTATIN)    Sig: Apply to affected area 2 times daily    Dispense:  60 g    Refill:  4   fluconazole (DIFLUCAN) 150 MG tablet    Sig: Take 1 tablet (150 mg total) by mouth every other day.    Dispense:  3 tablet    Refill:  0    Follow-up: Return in about 3 months (around 01/29/2022) for Diabetes follow-up.    Beatrice Lecher, MD

## 2021-10-31 NOTE — Assessment & Plan Note (Signed)
She still has increased work of breathing today on exam but is not complaining of increased shortness of breath.  That her daughter has noted she has a little bit more prolonged expiration.  Recommend spirometry we will schedule her this spring.

## 2021-10-31 NOTE — Assessment & Plan Note (Signed)
Not actively treating her obstructive sleep apnea.  She does have a follow-up coming up with neurology next month and will discuss further with them.  She may be a candidate for at least some nocturnal oxygen which we discussed does not actually correct the apnea apnea but may help keep her from being hypoxic overnight.

## 2021-10-31 NOTE — Patient Instructions (Signed)
Please schedule for spirometry sometime this spring.

## 2021-10-31 NOTE — Assessment & Plan Note (Signed)
Unfortunately she continues to get recurrent yeast infections we will go ahead and refill the nystatin powder and cream.  I will also send over prescription for alternating days of Diflucan x3 tabs for a new breakout episode if it occurs again.

## 2021-10-31 NOTE — Assessment & Plan Note (Signed)
Do think that her obesity is contributing to her shortness of breath as well as her obstructive sleep apnea.  She has not been consistently wearing her CPAP she has had significant difficulty with it

## 2021-10-31 NOTE — Assessment & Plan Note (Signed)
No sign of volume overload on exam today. °

## 2021-10-31 NOTE — Assessment & Plan Note (Signed)
A1c looks fantastic today at 6.1.  She is currently diet controlled.  She is on a statin.  She was taken off of the ARB.  Lab Results  Component Value Date   HGBA1C 6.1 (A) 10/31/2021

## 2021-11-11 ENCOUNTER — Emergency Department (INDEPENDENT_AMBULATORY_CARE_PROVIDER_SITE_OTHER)
Admission: RE | Admit: 2021-11-11 | Discharge: 2021-11-11 | Disposition: A | Discharge status: Home / Self Care | Payer: Medicare Other | Source: Ambulatory Visit | Attending: Family Medicine | Admitting: Family Medicine

## 2021-11-11 ENCOUNTER — Other Ambulatory Visit: Payer: Self-pay

## 2021-11-11 VITALS — BP 135/81 | HR 95 | Temp 97.5°F | Resp 18 | Ht 63.0 in | Wt 284.0 lb

## 2021-11-11 DIAGNOSIS — N3001 Acute cystitis with hematuria: Secondary | ICD-10-CM | POA: Diagnosis not present

## 2021-11-11 LAB — POCT URINALYSIS DIP (MANUAL ENTRY)
Bilirubin, UA: NEGATIVE
Glucose, UA: NEGATIVE mg/dL
Ketones, POC UA: NEGATIVE mg/dL
Nitrite, UA: POSITIVE — AB
Protein Ur, POC: NEGATIVE mg/dL
Spec Grav, UA: 1.025 (ref 1.010–1.025)
Urobilinogen, UA: 1 E.U./dL
pH, UA: 5.5 (ref 5.0–8.0)

## 2021-11-11 MED ORDER — SULFAMETHOXAZOLE-TRIMETHOPRIM 800-160 MG PO TABS: 1.0000 | ORAL_TABLET | Freq: Two times a day (BID) | ORAL | 0 refills | Status: AC

## 2021-11-11 NOTE — ED Triage Notes (Signed)
Pt states that she has some urinary frequency and urinary incontinence.  Pt states that she has had incontinence for the past couple of years. Pt wears pull ups.  Pt recently had a fall on 2/3.

## 2021-11-11 NOTE — Discharge Instructions (Addendum)
Make sure that you are drinking enough water Take antibiotic 2 times a day for a week Follow-up with Dr. Linford Arnold

## 2021-11-11 NOTE — ED Provider Notes (Signed)
[facility]    CSN: [id number] Arrival date & time: 11/11/21  1354      History   Chief Complaint Chief Complaint  Patient presents with   Urinary Frequency    Urinary frequency, and urinary incontinence.    HPI Charlotte [name] is a 86 y.o. female.   HPI  Patient has a history of recurring urinary tract infections.  She has had an increase in her urinary frequency and incontinence recently, and is here to be checked for a bladder infection.  No fever or chills.  No abdominal pain.  No flank pain.  No history of kidney stones or problems Daughter reports that patient fell yesterday.  She states that she heard a message on her phone answering machine and when she went to get out of bed she slipped to the floor.  She was unable to get up by herself.  She called EMS to help her.  The paramedics took her vital signs stated she was stable.  She states she was not injured in this fall.  Past Medical History:  Diagnosis Date   Aortic insufficiency    Atrial fibrillation ([place])    a. s/p DCCV 03/2013   CAD (coronary artery disease)    LHC 06/2010: EF 55%, mild plaque in the LAD 20-30%, otherwise normal coronary arteries   CATARACTS    Chronic systolic heart failure ([place])    in setting of AFib;  Echocardiogram 02/2013: Moderate LVH, EF 25-30%, anteroseptal and apical HK, moderate AI, MAC, moderate MR, moderate LAE, mild RAE, PASP 32 => after DCCV f/u Echo 8/14: Moderate LVH, EF 60-65%, normal wall motion, grade 1 diastolic dysfunction, moderate AI, moderate LAE   Complication of anesthesia 2009   nausea and vomitting   GERD (gastroesophageal reflux disease)    HYPERLIPIDEMIA, MILD    HYPERTENSION, BENIGN SYSTEMIC    Irritable bowel syndrome    LBBB (left bundle branch block)    MEDIAL EPICONDYLITIS    NSVT (nonsustained ventricular tachycardia)    during Dob Echo 2011 => normal cath   OSTEOARTHRITIS    OSTEOPENIA    PONV (postoperative nausea and vomiting)    Pre-diabetes     RHINITIS, ALLERGIC    Stroke ([place])    SYNCOPE    VITAMIN D DEFICIENCY     Patient Active Problem List   Diagnosis Date Noted   Yeast infection of the skin 10/31/2021   Macular degeneration 06/07/2021   Sleep-related hypoxia 07/09/2020   Severe sleep apnea 07/09/2020   Complex sleep apnea syndrome 06/23/2020   Iron deficiency anemia due to chronic blood loss 06/16/2020   Sleeps in sitting position due to orthopnea 05/12/2020   Chronic combined systolic and diastolic congestive heart failure ([place]) 05/12/2020   Morbid obesity ([place]) 05/12/2020   Excessive daytime sleepiness 05/12/2020   Nocturia 01/07/2020   Lacunar stroke ([place]) 09/28/2019    Class: History of   Stasis, venous 04/13/2019   NICM (nonischemic cardiomyopathy) ([place]) 07/12/2017   Low back pain without sciatica 06/05/2017   Compression fracture of L3 lumbar vertebra 06/02/2017   Low back pain 06/01/2017   Falls 06/01/2017   Intractable back pain 06/01/2017   Arteriosclerosis of abdominal aorta ([place]) 05/30/2017   Lumbosacral strain 05/29/2017   Symptomatic anemia 05/21/2017   Hyponatremia 05/21/2017   Gastrointestinal hemorrhage    Chronic constipation 05/10/2015   Atrial fibrillation ([place]) 11/10/2014   Severe obesity (BMI >= 40) ([place]) 06/18/2014   Spinal stenosis of lumbar region without neurogenic  claudication 03/30/2014   Restless legs syndrome (RLS) 03/30/2014   Idiopathic progressive polyneuropathy 03/30/2014   Aortic regurgitation 03/03/2014   Diabetes mellitus type 2 in obese ([place]) 12/09/2013   Long term current use of anticoagulant therapy 05/14/2013   VITAMIN D DEFICIENCY 10/31/2010   VENTRICULAR TACHYCARDIA 05/31/2010   DYSPNEA 04/24/2010   CATARACTS 04/20/2010   Edema 02/28/2010   ELECTROCARDIOGRAM, ABNORMAL 09/19/2007   Osteoarthritis of both ankles 01/01/2007   Hyperlipidemia 08/13/2006   Essential hypertension 07/16/2006   RHINITIS, ALLERGIC 07/16/2006   GASTROESOPHAGEAL REFLUX, NO  ESOPHAGITIS 07/16/2006   IRRITABLE BOWEL SYNDROME 07/16/2006   OSTEOPENIA 07/16/2006    Past Surgical History:  Procedure Laterality Date   BIOPSY  03/01/2020   Procedure: BIOPSY;  Surgeon: Otis Brace, MD;  Location: [place];  Service: Gastroenterology;;   CARDIOVERSION N/A 04/01/2013   Procedure: CARDIOVERSION;  Surgeon: Josue Hector, MD;  Location[facility] ENDOSCOPY;  Service: Cardiovascular;  Laterality: N/A;   CARDIOVERSION N/A 07/16/2017   Procedure: CARDIOVERSION;  Surgeon: Pixie Casino, MD;  Location: [facility] ENDOSCOPY;  Service: Cardiovascular;  Laterality: N/A;   CARDIOVERSION N/A 07/07/2018   Procedure: CARDIOVERSION;  Surgeon: Pixie Casino, MD;  Location: [place];  Service: Cardiovascular;  Laterality: N/A;   CATARACT EXTRACTION, BILATERAL  2012   Dr. Herbert Deaner   COLONOSCOPY WITH PROPOFOL N/A 05/23/2017   Procedure: COLONOSCOPY WITH PROPOFOL;  Surgeon: Wonda Horner, MD;  Location: [facility] ENDOSCOPY;  Service: Endoscopy;  Laterality: N/A;   ESOPHAGOGASTRODUODENOSCOPY N/A 03/01/2020   Procedure: ESOPHAGOGASTRODUODENOSCOPY (EGD);  Surgeon: Otis Brace, MD;  Location: [facility] ENDOSCOPY;  Service: Gastroenterology;  Laterality: N/A;   ESOPHAGOGASTRODUODENOSCOPY (EGD) WITH PROPOFOL N/A 05/23/2017   Procedure: ESOPHAGOGASTRODUODENOSCOPY (EGD) WITH PROPOFOL;  Surgeon: Wonda Horner, MD;  Location: [facility] ENDOSCOPY;  Service: Endoscopy;  Laterality: N/A;   IR KYPHO LUMBAR INC FX REDUCE BONE BX UNI/BIL CANNULATION INC/IMAGING  06/06/2017   IR RADIOLOGIST EVAL & MGMT  07/01/2017   RADICAL HYSTERECTOMY     REPLACEMENT TOTAL KNEE     11/03/07 right....... 03/29/08 left    OB History   No obstetric history on file.      Home Medications    Prior to Admission medications   Medication Sig Start Date End Date Taking? Authorizing Provider  acetaminophen (TYLENOL) 500 MG tablet Take 1,000 mg by mouth 3 (three) times daily.   Yes [provider]  atorvastatin (LIPITOR) 40 MG  tablet Take 1 tablet (40 mg total) by mouth daily. 06/07/21  Yes [name], MD  carvedilol (COREG) 25 MG tablet Take 1 tablet (25 mg total) by mouth 2 (two) times daily with a meal. 09/29/21  Yes Crenshaw, Denice Bors, MD  DULoxetine (CYMBALTA) 30 MG capsule TAKE 1 CAPSULE BY MOUTH  DAILY 08/08/21  Yes [name], MD  Ferrous Sulfate (IRON PO) Take 1 tablet by mouth every other day.   Yes [provider]  fluconazole (DIFLUCAN) 150 MG tablet Take 1 tablet (150 mg total) by mouth every other day. 10/31/21  Yes [name], MD  furosemide (LASIX) 20 MG tablet Take 1 tablet (20 mg total) by mouth 2 (two) times daily. 06/08/21  Yes Crenshaw, Denice Bors, MD  Multiple Vitamins-Minerals (SYSTANE ICAPS AREDS2) CAPS Take by mouth.   Yes [provider]  nystatin (MYCOSTATIN/NYSTOP) powder Apply 1 application topically 3 (three) times daily. 10/31/21  Yes [name], MD  nystatin cream (MYCOSTATIN) Apply to affected area 2 times daily 10/31/21  Yes Beatrice Lecher  D, MD  omeprazole (PRILOSEC) 40 MG capsule TAKE 1 CAPSULE BY MOUTH  TWICE DAILY IN THE MORNING  AND AT BEDTIME 08/03/21  Yes [name], MD  polyvinyl alcohol-povidone (HYPOTEARS) 1.4-0.6 % ophthalmic solution Place 1-2 drops into both eyes daily as needed (dry eyes).    Yes [provider]  potassium chloride (KLOR-CON) 10 MEQ tablet TAKE 1 TABLET (10 MEQ TOTAL) BY MOUTH 2 (TWO) TIMES DAILY. 06/30/21  Yes Lelon Perla, MD  rivaroxaban (XARELTO) 20 MG TABS tablet Take 1 tablet (20 mg total) by mouth daily with supper. 06/07/21  Yes [name], MD  sulfamethoxazole-trimethoprim (BACTRIM DS) 800-160 MG tablet Take 1 tablet by mouth 2 (two) times daily for 7 days. 11/11/21 11/18/21 Yes Raylene Everts, MD    Family History Family History  Problem Relation Age of Onset   Arrhythmia Mother        afib   Alzheimer's disease Mother    Stroke Mother    Colon cancer  Father    Heart attack Father 71   Diabetes Sister    Arrhythmia Sister        afib   Stroke Maternal Grandmother     Social History Social History   Tobacco Use   Smoking status: Former    Types: Cigarettes    Quit date: 10/08/1970    Years since quitting: 51.1   Smokeless tobacco: Never  Vaping Use   Vaping Use: Never used  Substance Use Topics   Alcohol use: No   Drug use: No     Allergies   Metformin and related and Amlodipine   Review of Systems Review of Systems See HPI  Physical Exam Triage Vital Signs ED Triage Vitals  Enc Vitals Group     BP 11/11/21 1414 135/81     Pulse Rate 11/11/21 1414 95     Resp 11/11/21 1414 18     Temp 11/11/21 1414 (!) 97.5 F (36.4 C)     Temp Source 11/11/21 1414 Oral     SpO2 11/11/21 1414 99 %     Weight 11/11/21 1412 284 lb (128.8 kg)     Height 11/11/21 1412 _0  (1.6 m)     Head Circumference --      Peak Flow --      Pain Score 11/11/21 1412 0     Pain Loc --      Pain Edu? --      Excl. in [place]? --    No data found.  Updated Vital Signs BP 135/81 (BP Location: Left Arm)    Pulse 95    Temp (!) 97.5 F (36.4 C) (Oral)    Resp 18    Ht _1  (1.6 m)    Wt 128.8 kg    SpO2 99%    BMI 50.31 kg/m      Physical Exam Constitutional:      General: She is not in acute distress.    Appearance: She is well-developed. She is obese. She is not ill-appearing.  HENT:     Head: Normocephalic and atraumatic.     Mouth/Throat:     Comments: Mask is in place Eyes:     Conjunctiva/sclera: Conjunctivae normal.     Pupils: Pupils are equal, round, and reactive to light.  Cardiovascular:     Rate and Rhythm: Normal rate.  Pulmonary:     Effort: Pulmonary effort is normal. No respiratory distress.  Abdominal:     General:  There is no distension.     Palpations: Abdomen is soft.     Tenderness: There is no right CVA tenderness or left CVA tenderness.  Musculoskeletal:        General: Normal range of motion.      Cervical back: Normal range of motion.  Skin:    General: Skin is warm and dry.  Neurological:     Mental Status: She is alert.  Psychiatric:        Behavior: Behavior normal.     UC Treatments / Results  Labs (all labs ordered are listed, but only abnormal results are displayed) Labs Reviewed  POCT URINALYSIS DIP (MANUAL ENTRY) - Abnormal; Notable for the following components:      Result Value   Clarity, UA cloudy (*)    Blood, UA trace-intact (*)    Nitrite, UA Positive (*)    Leukocytes, UA Small (1+) (*)    All other components within normal limits  URINE CULTURE    EKG   Radiology No results found.  Procedures Procedures (including critical care time)  Medications Ordered in UC Medications - No data to display  Initial Impression / Assessment and Plan / UC Course  I have reviewed the triage vital signs and the nursing notes.  Pertinent labs & imaging results that were available during my care of the patient were reviewed by me and considered in my medical decision making (see chart for details).     Urinalysis suggestive of infection.  We will treat with a week ofSulfamethoxazole/trimethoprim.  Reviewed old urine culture reports.  Reviewed allergies.Culture is pending Final Clinical Impressions(s) / UC Diagnoses   Final diagnoses:  Acute cystitis with hematuria     Discharge Instructions      Make sure that you are drinking enough water Take antibiotic 2 times a day for a week Follow-up with Dr. Madilyn Fireman   ED Prescriptions     Medication Sig [place]. Provider   sulfamethoxazole-trimethoprim (BACTRIM DS) 800-160 MG tablet Take 1 tablet by mouth 2 (two) times daily for 7 days. 14 tablet Raylene Everts, MD      PDMP not reviewed this encounter.   Raylene Everts, MD 11/11/21 [phone]

## 2021-11-14 ENCOUNTER — Other Ambulatory Visit: Payer: Self-pay

## 2021-11-14 ENCOUNTER — Ambulatory Visit (INDEPENDENT_AMBULATORY_CARE_PROVIDER_SITE_OTHER): Payer: Medicare Other | Admitting: Family Medicine

## 2021-11-14 ENCOUNTER — Encounter: Payer: Self-pay | Admitting: Family Medicine

## 2021-11-14 VITALS — BP 134/79 | HR 120 | Resp 18 | Ht 63.0 in | Wt 283.0 lb

## 2021-11-14 DIAGNOSIS — R350 Frequency of micturition: Secondary | ICD-10-CM | POA: Diagnosis not present

## 2021-11-14 DIAGNOSIS — K649 Unspecified hemorrhoids: Secondary | ICD-10-CM | POA: Diagnosis not present

## 2021-11-14 DIAGNOSIS — R413 Other amnesia: Secondary | ICD-10-CM

## 2021-11-14 LAB — URINE CULTURE
MICRO NUMBER:: 12966111
SPECIMEN QUALITY:: ADEQUATE

## 2021-11-14 NOTE — Assessment & Plan Note (Addendum)
MMSE score of 23/30.  Thought is was 54.  Most consistent with Alzheimer's dementia.  Usual symptoms.  Charlotte [name] struggling with short-term memory.  We discussed options including considering starting a memory medication such as Aricept.  Also discussed getting an updated brain MRI for further evaluation.  If she starts to develop new or worsening symptoms we could even consider a referral to memory clinic.  The biggest concern is for her daily safety and making sure that she is feeding herself and that she is taking her medications correctly.  She does live with her daughter but her daughter is at work during the daytime.  She does have long-term care insurance and her daughter is wanting to know if she is at the stage they should consider initiating that help.

## 2021-11-14 NOTE — Patient Instructions (Signed)
Please consider a brain MRI for further work-up. Consider a medication called Aricept to help maintain your current memory status.

## 2021-11-14 NOTE — Progress Notes (Signed)
Established Patient Office Visit  Subjective:  Patient ID: Charlotte [name], female    DOB: 02-09-1936  Age: 86 y.o. MRN: [id number]  CC:  Chief Complaint  Patient presents with   Discuss memory loss   Follow up     Rectal Bleeding. Occurred once 2 days ago/held Xarelto for one day due to rectal bleeding.      HPI Charlotte [name] presents to discuss worsening memory.  Her daughter reports that some days she does not remember if she is eating breakfast and some days she does not remember if she is taking her medication.  She says occasionally she will come home and catch her taking her medicines that she was actually supposed to be taking in the morning.  She said she had 1 day where she came home and she was cooking and had the stove on and she was little bit concerned about that.  Last MRI of the brain was December 2020, when she had an acute lacunar infarct of the left caudate nucleus.  And is stable right middle meningioma since 2018.  Recently she been experiencing some increase in falls and so they took her to urgent care.  They felt like she had a UTI so they have treating her currently with 7 days of antibiotics.  He is also been dealing with a bleeding hemorrhoid lately they just thought bleed once so they held 1 dose of Xarelto and the bleeding went away and they restarted the medication the next day.no constipation.  No straining.    Past Medical History:  Diagnosis Date   Aortic insufficiency    Atrial fibrillation ([place])    a. s/p DCCV 03/2013   CAD (coronary artery disease)    LHC 06/2010: EF 55%, mild plaque in the LAD 20-30%, otherwise normal coronary arteries   CATARACTS    Chronic systolic heart failure ([place])    in setting of AFib;  Echocardiogram 02/2013: Moderate LVH, EF 25-30%, anteroseptal and apical HK, moderate AI, MAC, moderate MR, moderate LAE, mild RAE, PASP 32 => after DCCV f/u Echo 8/14: Moderate LVH, EF 60-65%, normal wall motion, grade 1 diastolic dysfunction,  moderate AI, moderate LAE   Complication of anesthesia 2009   nausea and vomitting   GERD (gastroesophageal reflux disease)    HYPERLIPIDEMIA, MILD    HYPERTENSION, BENIGN SYSTEMIC    Irritable bowel syndrome    LBBB (left bundle branch block)    MEDIAL EPICONDYLITIS    NSVT (nonsustained ventricular tachycardia)    during Dob Echo 2011 => normal cath   OSTEOARTHRITIS    OSTEOPENIA    PONV (postoperative nausea and vomiting)    Pre-diabetes    RHINITIS, ALLERGIC    Stroke ([place])    SYNCOPE    VITAMIN D DEFICIENCY     Past Surgical History:  Procedure Laterality Date   BIOPSY  03/01/2020   Procedure: BIOPSY;  Surgeon: Otis Brace, MD;  Location: [place];  Service: Gastroenterology;;   CARDIOVERSION N/A 04/01/2013   Procedure: CARDIOVERSION;  Surgeon: Josue Hector, MD;  Location: [facility] ENDOSCOPY;  Service: Cardiovascular;  Laterality: N/A;   CARDIOVERSION N/A 07/16/2017   Procedure: CARDIOVERSION;  Surgeon: Pixie Casino, MD;  Location: [facility] ENDOSCOPY;  Service: Cardiovascular;  Laterality: N/A;   CARDIOVERSION N/A 07/07/2018   Procedure: CARDIOVERSION;  Surgeon: Pixie Casino, MD;  Location: [place];  Service: Cardiovascular;  Laterality: N/A;   CATARACT EXTRACTION, BILATERAL  2012   Dr. Herbert Deaner   COLONOSCOPY  WITH PROPOFOL N/A 05/23/2017   Procedure: COLONOSCOPY WITH PROPOFOL;  Surgeon: Wonda Horner, MD;  Location: [facility] ENDOSCOPY;  Service: Endoscopy;  Laterality: N/A;   ESOPHAGOGASTRODUODENOSCOPY N/A 03/01/2020   Procedure: ESOPHAGOGASTRODUODENOSCOPY (EGD);  Surgeon: Otis Brace, MD;  Location[facility] ENDOSCOPY;  Service: Gastroenterology;  Laterality: N/A;   ESOPHAGOGASTRODUODENOSCOPY (EGD) WITH PROPOFOL N/A 05/23/2017   Procedure: ESOPHAGOGASTRODUODENOSCOPY (EGD) WITH PROPOFOL;  Surgeon: Wonda Horner, MD;  Location: [facility] ENDOSCOPY;  Service: Endoscopy;  Laterality: N/A;   IR KYPHO LUMBAR INC FX REDUCE BONE BX UNI/BIL CANNULATION INC/IMAGING  06/06/2017   IR  RADIOLOGIST EVAL & MGMT  07/01/2017   RADICAL HYSTERECTOMY     REPLACEMENT TOTAL KNEE     11/03/07 right....... 03/29/08 left    Family History  Problem Relation Age of Onset   Arrhythmia Mother        afib   Alzheimer's disease Mother    Stroke Mother    Colon cancer Father    Heart attack Father 25   Diabetes Sister    Arrhythmia Sister        afib   Stroke Maternal Grandmother     Social History   Socioeconomic History   Marital status: Divorced    Spouse name: Not on file   Number of children: 1   Years of education: 12   Highest education level: High school graduate  Occupational History   Occupation: Retired    Fish farm manager: RETIRED  Tobacco Use   Smoking status: Former    Types: Cigarettes    Quit date: 10/08/1970    Years since quitting: 51.1   Smokeless tobacco: Never  Vaping Use   Vaping Use: Never used  Substance and Sexual Activity   Alcohol use: No   Drug use: No   Sexual activity: Not on file  Other Topics Concern   Not on file  Social History Narrative   Lives by herself at home, next to her daughter. She enjoys watching tv in her free time.   Social Determinants of Health   Financial Resource Strain: Low Risk    Difficulty of Paying Living Expenses: Not hard at all  Food Insecurity: No Food Insecurity   Worried About Charity fundraiser in the Last Year: Never true   [place] in the Last Year: Never true  Transportation Needs: No Transportation Needs   Lack of Transportation (Medical): No   Lack of Transportation (Non-Medical): No  Physical Activity: Inactive   Days of Exercise per Week: 0 days   Minutes of Exercise per Session: 0 min  Stress: No Stress Concern Present   Feeling of Stress : Not at all  Social Connections: Socially Isolated   Frequency of Communication with Friends and Family: More than three times a week   Frequency of Social Gatherings with Friends and Family: Twice a week   Attends Religious Services: Never   Building surveyor or Organizations: No   Attends Music therapist: Never   Marital Status: Divorced  Human resources officer Violence: Not At Risk   Fear of Current or Ex-Partner: No   Emotionally Abused: No   Physically Abused: No   Sexually Abused: No    Outpatient Medications Prior to Visit  Medication Sig Dispense Refill   acetaminophen (TYLENOL) 500 MG tablet Take 1,000 mg by mouth 3 (three) times daily.     atorvastatin (LIPITOR) 40 MG tablet Take 1 tablet (40 mg total) by mouth daily. 90 tablet 3  carvedilol (COREG) 25 MG tablet Take 1 tablet (25 mg total) by mouth 2 (two) times daily with a meal. 180 tablet 3   DULoxetine (CYMBALTA) 30 MG capsule TAKE 1 CAPSULE BY MOUTH  DAILY 90 capsule 1   Ferrous Sulfate (IRON PO) Take 1 tablet by mouth every other day.     fluconazole (DIFLUCAN) 150 MG tablet Take 1 tablet (150 mg total) by mouth every other day. 3 tablet 0   furosemide (LASIX) 20 MG tablet Take 1 tablet (20 mg total) by mouth 2 (two) times daily. 180 tablet 2   Multiple Vitamins-Minerals (SYSTANE ICAPS AREDS2) CAPS Take by mouth.     nystatin (MYCOSTATIN/NYSTOP) powder Apply 1 application topically 3 (three) times daily. 60 g 3   nystatin cream (MYCOSTATIN) Apply to affected area 2 times daily 60 g 4   omeprazole (PRILOSEC) 40 MG capsule TAKE 1 CAPSULE BY MOUTH  TWICE DAILY IN THE MORNING  AND AT BEDTIME 180 capsule 3   polyvinyl alcohol-povidone (HYPOTEARS) 1.4-0.6 % ophthalmic solution Place 1-2 drops into both eyes daily as needed (dry eyes).      potassium chloride (KLOR-CON) 10 MEQ tablet TAKE 1 TABLET (10 MEQ TOTAL) BY MOUTH 2 (TWO) TIMES DAILY. 180 tablet 3   rivaroxaban (XARELTO) 20 MG TABS tablet Take 1 tablet (20 mg total) by mouth daily with supper. 90 tablet 3   sulfamethoxazole-trimethoprim (BACTRIM DS) 800-160 MG tablet Take 1 tablet by mouth 2 (two) times daily for 7 days. 14 tablet 0   No facility-administered medications prior to visit.    Allergies   Allergen Reactions   Metformin And Related Other (See Comments)    Renal failure   Amlodipine Other (See Comments)    HEADACHE    ROS Review of Systems    Objective:    Physical Exam Constitutional:      Appearance: Normal appearance. She is well-developed.  HENT:     Head: Normocephalic and atraumatic.  Cardiovascular:     Rate and Rhythm: Normal rate and regular rhythm.     Heart sounds: Normal heart sounds.  Pulmonary:     Effort: Pulmonary effort is normal.     Breath sounds: Normal breath sounds.  Skin:    General: Skin is warm and dry.  Neurological:     Mental Status: She is alert and oriented to person, place, and time.  Psychiatric:        Behavior: Behavior normal.    BP 134/79    Pulse (!) 120    Resp 18    Ht 5' 3" (1.6 m)    Wt 283 lb (128.4 kg)    SpO2 95%    BMI 50.13 kg/m  Wt Readings from Last 3 Encounters:  11/14/21 283 lb (128.4 kg)  11/11/21 284 lb (128.8 kg)  10/31/21 285 lb (129.3 kg)     There are no preventive care reminders to display for this patient.  There are no preventive care reminders to display for this patient.  Lab Results  Component Value Date   TSH 2.67 07/18/2021   Lab Results  Component Value Date   WBC 6.4 06/16/2020   HGB 14.2 06/16/2020   HCT 44.0 06/16/2020   MCV 88.0 06/16/2020   PLT 216 06/16/2020   Lab Results  Component Value Date   NA 140 07/18/2021   K 4.3 07/18/2021   CO2 21 07/18/2021   GLUCOSE 84 07/18/2021   BUN 28 (H) 07/18/2021   CREATININE 0.88 07/18/2021  BILITOT 0.3 06/16/2020   ALKPHOS 109 03/30/2020   AST 18 06/16/2020   ALT 16 06/16/2020   PROT 7.4 06/16/2020   ALBUMIN 3.4 (L) 03/30/2020   CALCIUM 9.7 07/18/2021   ANIONGAP 9 03/30/2020   EGFR 65 07/18/2021   GFR 63.15 05/22/2013   Lab Results  Component Value Date   CHOL 131 01/31/2021   Lab Results  Component Value Date   HDL 47 (L) 01/31/2021   Lab Results  Component Value Date   LDLCALC 67 01/31/2021   Lab Results   Component Value Date   TRIG 90 01/31/2021   Lab Results  Component Value Date   CHOLHDL 2.8 01/31/2021   Lab Results  Component Value Date   HGBA1C 6.1 (A) 10/31/2021      Assessment & Plan:   Problem List Items Addressed This Visit       Other   Memory impairment    MMSE score of 23/30.  Thought is was 49.  Most consistent with Alzheimer's dementia.  Usual symptoms.  [place] struggling with short-term memory.  We discussed options including considering starting a memory medication such as Aricept.  Also discussed getting an updated brain MRI for further evaluation.  If she starts to develop new or worsening symptoms we could even consider a referral to memory clinic.  The biggest concern is for her daily safety and making sure that she is feeding herself and that she is taking her medications correctly.  She does live with her daughter but her daughter is at work during the daytime.  She does have long-term care insurance and her daughter is wanting to know if she is at the stage they should consider initiating that help.       Other Visit Diagnoses     Urinary frequency    -  Primary   Relevant Orders   Urine Culture   Bleeding hemorrhoid          Bleeding hemorrhoid-seems to be transient.  Just discussed the importance of keeping stool soft and moving and not having to strain.  UTI-make sure to complete antibiotic.  Once off the antibiotic for 1 week we will plan to recheck urine sample with a culture to assure that the infection has cleared.   No orders of the defined types were placed in this encounter.   Follow-up: No follow-ups on file.    Beatrice Lecher, MD

## 2021-11-28 ENCOUNTER — Other Ambulatory Visit: Payer: Self-pay

## 2021-11-28 DIAGNOSIS — R350 Frequency of micturition: Secondary | ICD-10-CM

## 2021-12-01 ENCOUNTER — Encounter: Payer: Self-pay | Admitting: Family Medicine

## 2021-12-01 LAB — URINE CULTURE

## 2021-12-01 NOTE — Progress Notes (Signed)
Hi Angela, looks like culture was canceled.  Not sure why.

## 2021-12-05 ENCOUNTER — Telehealth: Payer: Self-pay

## 2021-12-05 ENCOUNTER — Encounter: Payer: Self-pay | Admitting: Family Medicine

## 2021-12-05 ENCOUNTER — Ambulatory Visit: Payer: Medicare Other | Admitting: Family Medicine

## 2021-12-05 VITALS — BP 132/75 | HR 92 | Ht 63.0 in | Wt 284.5 lb

## 2021-12-05 DIAGNOSIS — I6381 Other cerebral infarction due to occlusion or stenosis of small artery: Secondary | ICD-10-CM | POA: Diagnosis not present

## 2021-12-05 DIAGNOSIS — Z7189 Other specified counseling: Secondary | ICD-10-CM

## 2021-12-05 DIAGNOSIS — G4731 Primary central sleep apnea: Secondary | ICD-10-CM | POA: Diagnosis not present

## 2021-12-05 DIAGNOSIS — G4739 Other sleep apnea: Secondary | ICD-10-CM

## 2021-12-05 NOTE — Telephone Encounter (Signed)
°  Spke with Jeanella Flattery, Media planner with [employer]. He sent me this correspondence that confirms therapist did go to pt's home.  Waiting for additional information.

## 2021-12-05 NOTE — Progress Notes (Signed)
PATIENT: Charlotte [name] DOB: 1936-07-03  REASON FOR VISIT: follow up HISTORY FROM: patient  Chief Complaint  Patient presents with   Obstructive Sleep Apnea    Rm 1, w daughter. Here for yearly ASV f/u. Pt reports having trouble getting a good seal and has not been able to use nightly.      HISTORY OF PRESENT ILLNESS: 12/05/21 ALL: Charlotte [name] returns for follow up for complex sleep apnea on ASV. She was last seen by Dr Brett Fairy 11/2020. She was advised to work with [employer] in person to correct mask fit/leak. Her daughter called in 09/2021 stating that they were not seen in person and Charlotte [name] continued to have difficulty with compliance. We called [employer] again to ask that she be sen in person. Daughther reports that RT called them but did not feel in person visit was needed and stated that current dream Routh mask was best fit for her. She feels that she has mask sealed correctly but notes significant air blowing from top of mask. Daughter is concerned that pressure setting are too strong for her to tolerate. She does note significant improvement in alertness when her mother is able to use ASV. She remains very tired during the day. She will fall asleep anytime she gets still. ESS completed, today, however daughter feels that multiple questions do not apply and no answers are documented. I suspect ESS is higher than stated responses.   She continues atorvastatin 85m daily. She is on Xarelto for afib. She continues to use her Rolator. No falls. No stroke symptoms. She is follwed regularly by PCP.     10/20/2020 ALL:  Charlotte ROGNESSis a 86y.o. female here today for follow up for recently diagnosed severe complex sleep apnea started on ASV therapy.  HST showed AHI 67/hr with 29 central events. She had hypoxemia for > 60 minutes. Titration study on 9/29 showed events were well controlled on ASV (Dreamwear FFM, small).  She reports that she is trying to adjust to therapy.  She had a very difficult time  in the beginning with air leaking from the DreamWear mask. She has recently changed to a different fullface mask that covers the bridge of her nose.  She feels that she is doing better with this mask.  She does continue to hear air that prevents her from being able to go to sleep.  Compliance report dated 09/19/2020 through 10/18/2020 reveals that she used ASV 22 of the past 30 days for compliance of 73%.  She used ASV greater than 4 hours to of the past 30 days for compliance of 7%.  Average usage on days used was 1 hour and 27 minutes.  Residual AHI was 8.2 with a EPAP of 10 cm of water and pressure support of 4-15.  There was a significant leak noted in the 95th percentile of 37.1 L/min.   She was last seen by Dr AJaynee Eaglesfor stroke follow up in 03/2020. She continues Xarelto. She was referred to GI for gastric bleeds but never seen. She has been doing well on pantoprazole. She also continues atorvastatin 466m    HISTORY: (copied from previous note)  PeCHANNEL PAPANDREAs a [age]ear- old Caucasian female  Stroke patient and seen here upon referral on 05/12/2020 from Dr AhJaynee Eagles  Chief concern according to patient :    Charlotte [name] excessive daytime sleepiness being more sleepy than she usually used to be.  She has very frequent nocturia and  this is not a new issue but may have been present for 2-3 or even 4 years.  Multiple bathroom breaks means up to 7 nocturia's.  She also has reached a stage of physical deconditioning that makes it harder for her to not get short of breath while she walks or performs rather simple tasks.  She is using a walker for ambulation, but she reports that she sleeps because of orthopnea better than her back is elevated so a recliner is more conducive to sleep than her bed. Family members have not heard her snore and there is a good chance that this patient has central apnea due to her congestive heart failure history associated with atrial fibrillation.  She also may be much more  unrefreshed because of anemia and chronic blood loss in the setting of chronic anticoagulation.  She had suffered a GI bleed in the past.  There was no alternative to resuming at least some level of anticoagulation safely.  Remains on Xarelto and  No longer on ASA.    I have the pleasure of seeing Charlotte [name] today, a right -handed White or Caucasian female with a possible sleep disorder.  She has a  has a past medical history of Aortic insufficiency, Atrial fibrillation ([place]), CAD (coronary artery disease), CATARACTS, Chronic systolic heart failure ([place]), Complication of anesthesia (2009), GERD (gastroesophageal reflux disease), HYPERLIPIDEMIA, MILD, HYPERTENSION, BENIGN SYSTEMIC, Irritable bowel syndrome, LBBB (left bundle branch block), MEDIAL EPICONDYLITIS, NSVT (nonsustained ventricular tachycardia) (HCC), OSTEOARTHRITIS, OSTEOPENIA, PONV (postoperative nausea and vomiting), Pre-diabetes, RHINITIS, ALLERGIC, Stroke ([place]), SYNCOPE, and VITAMIN D DEFICIENCY.   Social history:  Patient is retired from [employer], and lives in a household with alone.  Family status is widowed  with adult children, her daughter is her neighbour.   She likes puzzles and sudoko. No longer reads.  Pets are not present. Tobacco use "not in 50 years ".  ETOH use ;none ,  Caffeine intake in form of Coffee( some) Soda( /) Tea ( some) or energy drinks.   Sleep habits are as follows: The patient's dinner time is between 6 PM.  The patient goes to bed at 10.30 PM and continues to sleep for one hour at a time , fragmented  hourly, wakes for nocturia- bathroom breaks. The preferred sleep position is in bed in a lateral or supine position, but she changes soon to the recliner-  About 4-5 AM.  9 AM is the usual rise time. The patient wakes up spontaneously.  She reports not feeling refreshed or restored in AM, with symptoms such as dry mouth and residual fatigue.  Naps are taken frequently,  She will doze off -  lasting from 20-60 minutes and are more refreshing than nocturnal sleep.   REVIEW OF SYSTEMS: Out of a complete 14 system review of symptoms, the patient complains only of the following symptoms, fatigue, shob, insomnia and all other reviewed systems are negative.  ESS: 11, did not answer question regarding sitting in public place or riding in car for 1 hour    ALLERGIES: Allergies  Allergen Reactions   Metformin And Related Other (See Comments)    Renal failure   Amlodipine Other (See Comments)    HEADACHE    HOME MEDICATIONS: Outpatient Medications Prior to Visit  Medication Sig Dispense Refill   acetaminophen (TYLENOL) 500 MG tablet Take 1,000 mg by mouth 3 (three) times daily.     atorvastatin (LIPITOR) 40 MG tablet Take 1 tablet (40 mg total) by  mouth daily. 90 tablet 3   carvedilol (COREG) 25 MG tablet Take 1 tablet (25 mg total) by mouth 2 (two) times daily with a meal. 180 tablet 3   DULoxetine (CYMBALTA) 30 MG capsule TAKE 1 CAPSULE BY MOUTH  DAILY 90 capsule 1   Ferrous Sulfate (IRON PO) Take 1 tablet by mouth every other day.     fluconazole (DIFLUCAN) 150 MG tablet Take 1 tablet (150 mg total) by mouth every other day. 3 tablet 0   furosemide (LASIX) 20 MG tablet Take 1 tablet (20 mg total) by mouth 2 (two) times daily. 180 tablet 2   Multiple Vitamins-Minerals (SYSTANE ICAPS AREDS2) CAPS Take by mouth.     nystatin (MYCOSTATIN/NYSTOP) powder Apply 1 application topically 3 (three) times daily. 60 g 3   nystatin cream (MYCOSTATIN) Apply to affected area 2 times daily 60 g 4   omeprazole (PRILOSEC) 40 MG capsule TAKE 1 CAPSULE BY MOUTH  TWICE DAILY IN THE MORNING  AND AT BEDTIME 180 capsule 3   polyvinyl alcohol-povidone (HYPOTEARS) 1.4-0.6 % ophthalmic solution Place 1-2 drops into both eyes daily as needed (dry eyes).      potassium chloride (KLOR-CON) 10 MEQ tablet TAKE 1 TABLET (10 MEQ TOTAL) BY MOUTH 2 (TWO) TIMES DAILY. 180 tablet 3   rivaroxaban (XARELTO) 20 MG TABS  tablet Take 1 tablet (20 mg total) by mouth daily with supper. 90 tablet 3   No facility-administered medications prior to visit.    PAST MEDICAL HISTORY: Past Medical History:  Diagnosis Date   Aortic insufficiency    Atrial fibrillation ([place])    a. s/p DCCV 03/2013   CAD (coronary artery disease)    LHC 06/2010: EF 55%, mild plaque in the LAD 20-30%, otherwise normal coronary arteries   CATARACTS    Chronic systolic heart failure ([place])    in setting of AFib;  Echocardiogram 02/2013: Moderate LVH, EF 25-30%, anteroseptal and apical HK, moderate AI, MAC, moderate MR, moderate LAE, mild RAE, PASP 32 => after DCCV f/u Echo 8/14: Moderate LVH, EF 60-65%, normal wall motion, grade 1 diastolic dysfunction, moderate AI, moderate LAE   Complication of anesthesia 2009   nausea and vomitting   GERD (gastroesophageal reflux disease)    HYPERLIPIDEMIA, MILD    HYPERTENSION, BENIGN SYSTEMIC    Irritable bowel syndrome    LBBB (left bundle branch block)    MEDIAL EPICONDYLITIS    NSVT (nonsustained ventricular tachycardia)    during Dob Echo 2011 => normal cath   OSTEOARTHRITIS    OSTEOPENIA    PONV (postoperative nausea and vomiting)    Pre-diabetes    RHINITIS, ALLERGIC    Stroke ([place])    SYNCOPE    VITAMIN D DEFICIENCY     PAST SURGICAL HISTORY: Past Surgical History:  Procedure Laterality Date   BIOPSY  03/01/2020   Procedure: BIOPSY;  Surgeon: Otis Brace, MD;  Location: [facility];  Service: Gastroenterology;;   CARDIOVERSION N/A 04/01/2013   Procedure: CARDIOVERSION;  Surgeon: Josue Hector, MD;  Location: [facility] ENDOSCOPY;  Service: Cardiovascular;  Laterality: N/A;   CARDIOVERSION N/A 07/16/2017   Procedure: CARDIOVERSION;  Surgeon: Pixie Casino, MD;  Location[facility] ENDOSCOPY;  Service: Cardiovascular;  Laterality: N/A;   CARDIOVERSION N/A 07/07/2018   Procedure: CARDIOVERSION;  Surgeon: Pixie Casino, MD;  Location: [facility];  Service: Cardiovascular;  Laterality:  N/A;   CATARACT EXTRACTION, BILATERAL  2012   Dr. Herbert Deaner   COLONOSCOPY WITH PROPOFOL N/A 05/23/2017   Procedure:  COLONOSCOPY WITH PROPOFOL;  Surgeon: Wonda Horner, MD;  Location: [facility] ENDOSCOPY;  Service: Endoscopy;  Laterality: N/A;   ESOPHAGOGASTRODUODENOSCOPY N/A 03/01/2020   Procedure: ESOPHAGOGASTRODUODENOSCOPY (EGD);  Surgeon: Otis Brace, MD;  Location: [facility] ENDOSCOPY;  Service: Gastroenterology;  Laterality: N/A;   ESOPHAGOGASTRODUODENOSCOPY (EGD) WITH PROPOFOL N/A 05/23/2017   Procedure: ESOPHAGOGASTRODUODENOSCOPY (EGD) WITH PROPOFOL;  Surgeon: Wonda Horner, MD;  Location: [facility] ENDOSCOPY;  Service: Endoscopy;  Laterality: N/A;   IR KYPHO LUMBAR INC FX REDUCE BONE BX UNI/BIL CANNULATION INC/IMAGING  06/06/2017   IR RADIOLOGIST EVAL & MGMT  07/01/2017   RADICAL HYSTERECTOMY     REPLACEMENT TOTAL KNEE     11/03/07 right....... 03/29/08 left    FAMILY HISTORY: Family History  Problem Relation Age of Onset   Arrhythmia Mother        afib   Alzheimer's disease Mother    Stroke Mother    Colon cancer Father    Heart attack Father 15   Diabetes Sister    Arrhythmia Sister        afib   Stroke Maternal Grandmother     SOCIAL HISTORY: Social History   Socioeconomic History   Marital status: Divorced    Spouse name: Not on file   Number of children: 1   Years of education: 12   Highest education level: High school graduate  Occupational History   Occupation: Retired    Fish farm manager: RETIRED  Tobacco Use   Smoking status: Former    Types: Cigarettes    Quit date: 10/08/1970    Years since quitting: 51.1   Smokeless tobacco: Never  Vaping Use   Vaping Use: Never used  Substance and Sexual Activity   Alcohol use: No   Drug use: No   Sexual activity: Not on file  Other Topics Concern   Not on file  Social History Narrative   Lives by herself at home, next to her daughter. She enjoys watching tv in her free time.   Social Determinants of Health   Financial Resource  Strain: Low Risk    Difficulty of Paying Living Expenses: Not hard at all  Food Insecurity: No Food Insecurity   Worried About Charity fundraiser in the Last Year: Never true   Start in the Last Year: Never true  Transportation Needs: No Transportation Needs   Lack of Transportation (Medical): No   Lack of Transportation (Non-Medical): No  Physical Activity: Inactive   Days of Exercise per Week: 0 days   Minutes of Exercise per Session: 0 min  Stress: No Stress Concern Present   Feeling of Stress : Not at all  Social Connections: Socially Isolated   Frequency of Communication with Friends and Family: More than three times a week   Frequency of Social Gatherings with Friends and Family: Twice a week   Attends Religious Services: Never   Marine scientist or Organizations: No   Attends Music therapist: Never   Marital Status: Divorced  Human resources officer Violence: Not At Risk   Fear of Current or Ex-Partner: No   Emotionally Abused: No   Physically Abused: No   Sexually Abused: No     PHYSICAL EXAM  Vitals:   12/05/21 1345  BP: 132/75  Pulse: 92  Weight: 284 lb 8 oz (129 kg)  Height: _0  (1.6 m)    Body mass index is 50.4 kg/m.  Generalized: Well developed, in no acute distress  Cardiology: normal rate and rhythm,  no murmur noted Respiratory: clear to auscultation bilaterally  Neurological examination  Mentation: Alert oriented to time, place, history taking. Follows all commands speech and language fluent Cranial nerve II-XII: Pupils were equal round reactive to light. Extraocular movements were full, visual field were full  Motor: The motor testing reveals 5 over 5 strength of all 4 extremities. Good symmetric motor tone is noted throughout.  Gait and station: Gait is stable with Rollator   DIAGNOSTIC DATA (LABS, IMAGING, TESTING) - I reviewed patient records, labs, notes, testing and imaging myself where available.  No flowsheet  data found.   Lab Results  Component Value Date   WBC 6.4 06/16/2020   HGB 14.2 06/16/2020   HCT 44.0 06/16/2020   MCV 88.0 06/16/2020   PLT 216 06/16/2020      Component Value Date/Time   NA 140 07/18/2021 0000   NA 142 05/09/2018 1556   K 4.3 07/18/2021 0000   CL 103 07/18/2021 0000   CO2 21 07/18/2021 0000   GLUCOSE 84 07/18/2021 0000   BUN 28 (H) 07/18/2021 0000   BUN 30 (H) 05/09/2018 1556   CREATININE 0.88 07/18/2021 0000   CALCIUM 9.7 07/18/2021 0000   PROT 7.4 06/16/2020 1555   ALBUMIN 3.4 (L) 03/30/2020 2104   AST 18 06/16/2020 1555   ALT 16 06/16/2020 1555   ALKPHOS 109 03/30/2020 2104   BILITOT 0.3 06/16/2020 1555   GFRNONAA 61 01/31/2021 0000   GFRAA 70 01/31/2021 0000   Lab Results  Component Value Date   CHOL 131 01/31/2021   HDL 47 (L) 01/31/2021   LDLCALC 67 01/31/2021   TRIG 90 01/31/2021   CHOLHDL 2.8 01/31/2021   Lab Results  Component Value Date   HGBA1C 6.1 (A) 10/31/2021   Lab Results  Component Value Date   VITAMINB12 319 05/22/2017   Lab Results  Component Value Date   TSH 2.67 07/18/2021     ASSESSMENT AND PLAN 86 y.o. year old female  has a past medical history of Aortic insufficiency, Atrial fibrillation ([place]), CAD (coronary artery disease), CATARACTS, Chronic systolic heart failure ([place]), Complication of anesthesia (2009), GERD (gastroesophageal reflux disease), HYPERLIPIDEMIA, MILD, HYPERTENSION, BENIGN SYSTEMIC, Irritable bowel syndrome, LBBB (left bundle branch block), MEDIAL EPICONDYLITIS, NSVT (nonsustained ventricular tachycardia), OSTEOARTHRITIS, OSTEOPENIA, PONV (postoperative nausea and vomiting), Pre-diabetes, RHINITIS, ALLERGIC, Stroke ([place]), SYNCOPE, and VITAMIN D DEFICIENCY. here with     ICD-10-CM   1. Lacunar stroke ([place])  I63.81     2. Complex sleep apnea syndrome  G47.31     3. Encounter for counseling on adaptive servo-ventilation (ASV) use  Z71.89 Cpap titration    CANCELED: Cpap titration    4.  Treatment-emergent central sleep apnea  G47.39       Charlotte [name] continues to have difficulty with ASV therapy. Compliance report reveals sup optimal daily and 4 hour usage. She has had a difficult time with hearing air leaking from her mask and feels pressure settings are too strong. After discussion with sleep lab, I feel it is best to have her return for supervised titration study.  Risks of untreated sleep apnea review and education materials provided. Healthy lifestyle habits encouraged.  She will continue close follow-up with primary care for stroke prevention.  She will follow up in 3 months, sooner if needed.  She verbalizes understanding and agreement with this plan.    Orders Placed This Encounter  Procedures   Cpap titration    On ASV at home, having difficulty with compliance  and has high leak. AHI remains elevated on ASV at >26/hr. Please call daughter to schedule titration    Standing Status:   Future    Standing Expiration Date:   12/05/2022    Order Specific Question:   Where should this test be performed:    Answer:   [facility]     No orders of the defined types were placed in this encounter.     I spent 30 minutes with the patient. 50% of this time was spent counseling and educating patient on plan of care and medications.    Debbora Presto, FNP-C 12/05/2021, 3:22 PM [facility] [address] [place], [place] [address] [phone]

## 2021-12-05 NOTE — Patient Instructions (Addendum)
Please continue using your ASV regularly. While your insurance requires that you use ASV at least 4 hours each night on 70% of the nights, I recommend, that you not skip any nights and use it throughout the night if you can. Getting used to ASV and staying with the treatment long term does take time and patience and discipline. Untreated obstructive sleep apnea when it is moderate to severe can have an adverse impact on cardiovascular health and raise her risk for heart disease, arrhythmias, hypertension, congestive heart failure, stroke and diabetes. Untreated obstructive sleep apnea causes sleep disruption, nonrestorative sleep, and sleep deprivation. This can have an impact on your day to day functioning and cause daytime sleepiness and impairment of cognitive function, memory loss, mood disturbance, and problems focussing. Using ASV regularly can improve these symptoms.   Continue close follow up with PCP for co morbidity management and stroke prevention.    We will set you up for a titration study. Once we fine tune your settings, I will have you come back to see Dr Brett Fairy (3-4 months) then I look forward to continuing follow up with me.

## 2021-12-12 ENCOUNTER — Ambulatory Visit: Payer: Medicare Other | Admitting: Neurology

## 2021-12-13 ENCOUNTER — Telehealth: Payer: Self-pay | Admitting: Neurology

## 2021-12-18 ENCOUNTER — Ambulatory Visit (INDEPENDENT_AMBULATORY_CARE_PROVIDER_SITE_OTHER): Payer: Medicare Other | Admitting: Neurology

## 2021-12-18 DIAGNOSIS — G4731 Primary central sleep apnea: Secondary | ICD-10-CM | POA: Diagnosis not present

## 2021-12-18 DIAGNOSIS — G4719 Other hypersomnia: Secondary | ICD-10-CM

## 2021-12-18 DIAGNOSIS — Z7189 Other specified counseling: Secondary | ICD-10-CM

## 2021-12-18 DIAGNOSIS — I4821 Permanent atrial fibrillation: Secondary | ICD-10-CM

## 2021-12-18 DIAGNOSIS — I5042 Chronic combined systolic (congestive) and diastolic (congestive) heart failure: Secondary | ICD-10-CM

## 2021-12-20 ENCOUNTER — Other Ambulatory Visit: Payer: Self-pay | Admitting: Family Medicine

## 2021-12-20 ENCOUNTER — Telehealth: Payer: Self-pay | Admitting: Neurology

## 2021-12-20 DIAGNOSIS — G4731 Primary central sleep apnea: Secondary | ICD-10-CM

## 2021-12-20 NOTE — Telephone Encounter (Signed)
Reviewed the sleep study results with the daughter and the patient. Advised orders will be sent to [employer] for them to make that pressure change along with making sure she gets set up with the mask she was fitted with in the sleep lab. Advised the daughter that we would like to make sure that someone sets a time aside to come to the home and work with the patient and complete education with her and the daughter. She verbalized understanding and was advised to call us if she has any problems. ?

## 2021-12-20 NOTE — Telephone Encounter (Signed)
-----   Message from Shawnie Dapper, NP sent at 12/20/2021  4:11 PM EDT ----- ?Can you please make sure pressure setting orders get sent and help me monitor that they were made. Let me know if I need to do anything else! TY! ?

## 2021-12-20 NOTE — Procedures (Deleted)
The original result note has been redacted due to error. °

## 2021-12-26 ENCOUNTER — Ambulatory Visit: Payer: Medicare Other | Admitting: Cardiology

## 2021-12-29 DIAGNOSIS — G4731 Primary central sleep apnea: Secondary | ICD-10-CM | POA: Diagnosis not present

## 2022-01-10 ENCOUNTER — Encounter: Payer: Self-pay | Admitting: Family Medicine

## 2022-01-10 ENCOUNTER — Encounter: Payer: Self-pay | Admitting: Neurology

## 2022-01-11 ENCOUNTER — Encounter: Payer: Self-pay | Admitting: Family Medicine

## 2022-01-12 ENCOUNTER — Other Ambulatory Visit: Payer: Self-pay | Admitting: Family Medicine

## 2022-01-12 DIAGNOSIS — G603 Idiopathic progressive neuropathy: Secondary | ICD-10-CM

## 2022-01-15 NOTE — Telephone Encounter (Signed)
Patient has f/u appointment scheduled 01/30/22.  ?

## 2022-01-15 NOTE — Telephone Encounter (Signed)
IT ticket placed to contact MD to make correction to note ?

## 2022-01-17 NOTE — Telephone Encounter (Signed)
No haven't seen them.   ?

## 2022-01-30 ENCOUNTER — Encounter: Payer: Self-pay | Admitting: Family Medicine

## 2022-01-30 ENCOUNTER — Ambulatory Visit (INDEPENDENT_AMBULATORY_CARE_PROVIDER_SITE_OTHER): Payer: Medicare Other | Admitting: Family Medicine

## 2022-01-30 VITALS — BP 153/82 | HR 99 | Resp 18 | Ht 63.0 in | Wt 302.0 lb

## 2022-01-30 DIAGNOSIS — I5042 Chronic combined systolic (congestive) and diastolic (congestive) heart failure: Secondary | ICD-10-CM

## 2022-01-30 DIAGNOSIS — R0602 Shortness of breath: Secondary | ICD-10-CM

## 2022-01-30 DIAGNOSIS — E669 Obesity, unspecified: Secondary | ICD-10-CM | POA: Diagnosis not present

## 2022-01-30 DIAGNOSIS — R300 Dysuria: Secondary | ICD-10-CM | POA: Diagnosis not present

## 2022-01-30 DIAGNOSIS — I700 Atherosclerosis of aorta: Secondary | ICD-10-CM | POA: Diagnosis not present

## 2022-01-30 DIAGNOSIS — E1169 Type 2 diabetes mellitus with other specified complication: Secondary | ICD-10-CM | POA: Diagnosis not present

## 2022-01-30 DIAGNOSIS — R609 Edema, unspecified: Secondary | ICD-10-CM | POA: Diagnosis not present

## 2022-01-30 DIAGNOSIS — I10 Essential (primary) hypertension: Secondary | ICD-10-CM | POA: Diagnosis not present

## 2022-01-30 LAB — POCT GLYCOSYLATED HEMOGLOBIN (HGB A1C): Hemoglobin A1C: 5.7 % — AB (ref 4.0–5.6)

## 2022-01-30 LAB — POCT UA - MICROALBUMIN
Albumin/Creatinine Ratio, Urine, POC: <30
Creatinine, POC: 300 mg/dL

## 2022-01-30 NOTE — Assessment & Plan Note (Signed)
She is on the verge of an acute exacerbation her weight is up significantly and she is short of breath with activities.  Her blood pressure is also little elevated today.  She did go up on her Lasix for a few days and her daughter says that she dropped about 5 pounds but did not like having to use the bathroom more frequently as it is difficult for her to get up and down so she went back to her typical dose.  We discussed trying to take an extra 2 tabs maybe every other day to see if she is able to tolerate that and also limiting fluid to no more than 50 ounces total including coffee, tea, water, milk and soda. ?

## 2022-01-30 NOTE — Patient Instructions (Signed)
Please limit total fluid intake per day to 50 ounces. ?Try to take 2 extra furosemide tabs every other day. ?Monitor your weight daily and let me know how things are doing in about 2 weeks and what weight you are at. ? ?

## 2022-01-30 NOTE — Assessment & Plan Note (Addendum)
A1c today is 5.7 and looks phenomenal.  Significant decrease from previous she is actually doing really well in this regard.  She is primarily diet controlled.  Plan to follow-up in 4 months. ? ?Lab Results  ?Component Value Date  ? HGBA1C 5.7 (A) 01/30/2022  ? ? ?

## 2022-01-30 NOTE — Assessment & Plan Note (Signed)
Continue atorvastatin

## 2022-01-30 NOTE — Progress Notes (Signed)
? ?Established Patient Office Visit ? ?Subjective   ?Patient ID: Charlotte [name], female    DOB: April 22, 1936  Age: 86 y.o. MRN: [id number] ? ?Chief Complaint  ?Patient presents with  ? Diabetes  ?  Follow up   ? ? ?HPI ? ? ?Diabetes - no hypoglycemic events. No wounds or sores that are not healing well. No increased thirst or urination. Checking glucose at home. Taking medications as prescribed without any side effects.  Due for urine microalbumin today. ? ?Hypertension- Pt denies chest pain, SOB, dizziness, or heart palpitations.  Taking meds as directed w/o problems.  Denies medication side effects.   ? ?Weight is up and was 20 pounds from about 2 months ago.  Back she had called about 3 weeks ago saying that she had noticed some increased swelling.  We told her that it was okay to increase her Lasix to 2 tablets in the morning and 1 in the evening if needed.  Tried compression stockings but she does not like to Abe them.  She does have rolling them down.  Her daughter feels like some of it is due to the fact that she is sleeping better at night so she is up longer during the day she is eating more consistently and probably drinking more fluids in general.  She mostly drinks water but does have a glass of tea at night.  She occasionally feels lightheaded when standing but no chest pain or shortness of palpitations.   ? ?Get her new CPAP machine and supplies and is really doing well with that. ? ?Still has ? ? ? ?ROS ? ?  ?Objective:  ?  ? ?BP (!) 153/82   Pulse 99   Resp 18   Ht 5\' 3"  (1.6 m)   Wt (!) 302 lb (137 kg)   SpO2 100%   BMI 53.50 kg/m?  ? ? ?Physical Exam ?Vitals and nursing note reviewed.  ?Constitutional:   ?   Appearance: She is well-developed.  ?HENT:  ?   Head: Normocephalic and atraumatic.  ?Cardiovascular:  ?   Rate and Rhythm: Normal rate and regular rhythm.  ?   Heart sounds: Normal heart sounds.  ?Pulmonary:  ?   Effort: Pulmonary effort is normal.  ?   Breath sounds: Normal breath sounds.   ?Skin: ?   General: Skin is warm and dry.  ?   Comments: She has 2+ pitting edema around her lower legs bilaterally.  Is got 1+ pitting edema on the tops of her feet.  ?Neurological:  ?   Mental Status: She is alert and oriented to person, place, and time.  ?Psychiatric:     ?   Behavior: Behavior normal.  ? ? ? ?Results for orders placed or performed in visit on 01/30/22  ?POCT HgB A1C  ?Result Value Ref Range  ? Hemoglobin A1C 5.7 (A) 4.0 - 5.6 %  ? HbA1c POC (<> result, manual entry)    ? HbA1c, POC (prediabetic range)    ? HbA1c, POC (controlled diabetic range)    ?POCT UA - Microalbumin  ?Result Value Ref Range  ? Microalbumin Ur, POC 10mg /L mg/L  ? Creatinine, POC 300 mg/dl mg/dL  ? Albumin/Creatinine Ratio, Urine, POC <30 mg/g   ? ? ? ? ?The ASCVD Risk score (Arnett DK, et al., 2019) failed to calculate for the following reasons: ?  The 2019 ASCVD risk score is only valid for ages 63 to 78 ? ?  ?Assessment & Plan:  ? ?  Problem List Items Addressed This Visit   ? ?  ? Cardiovascular and Mediastinum  ? Essential hypertension - Primary  ? Relevant Orders  ? COMPLETE METABOLIC PANEL WITH GFR  ? B Nat Peptide  ? CBC with Differential/Platelet  ? Urine Microalbumin w/creat. ratio  ? Chronic combined systolic and diastolic congestive heart failure ([place])  ?  She is on the verge of an acute exacerbation her weight is up significantly and she is short of breath with activities.  Her blood pressure is also little elevated today.  She did go up on her Lasix for a few days and her daughter says that she dropped about 5 pounds but did not like having to use the bathroom more frequently as it is difficult for her to get up and down so she went back to her typical dose.  We discussed trying to take an extra 2 tabs maybe every other day to see if she is able to tolerate that and also limiting fluid to no more than 50 ounces total including coffee, tea, water, milk and soda. ? ?  ?  ? Arteriosclerosis of abdominal aorta ([place])   ?  Continue atorvastatin. ? ?  ?  ?  ? Endocrine  ? Diabetes mellitus type 2 in obese [facility])  ?  A1c today is 5.7 and looks phenomenal.  Significant decrease from previous she is actually doing really well in this regard.  She is primarily diet controlled.  Plan to follow-up in 4 months. ? ?Lab Results  ?Component Value Date  ? HGBA1C 5.7 (A) 01/30/2022  ? ? ?  ?  ? Relevant Orders  ? POCT HgB A1C (Completed)  ? POCT UA - Microalbumin (Completed)  ? COMPLETE METABOLIC PANEL WITH GFR  ? B Nat Peptide  ? CBC with Differential/Platelet  ? Urine Microalbumin w/creat. ratio  ? ?Other Visit Diagnoses   ? ? SOB (shortness of breath)      ? Relevant Orders  ? COMPLETE METABOLIC PANEL WITH GFR  ? B Nat Peptide  ? CBC with Differential/Platelet  ? Urine Microalbumin w/creat. ratio  ? Peripheral edema      ? Relevant Orders  ? COMPLETE METABOLIC PANEL WITH GFR  ? B Nat Peptide  ? CBC with Differential/Platelet  ? Urine Microalbumin w/creat. ratio  ? Dysuria      ? Relevant Orders  ? Urine Culture  ? ?  ? ?Encouraged her daughter to send me a MyChart note in a week or 2 to let me know what her weight is and how she is doing with fluid restriction. ? ?Return in about 4 weeks (around 02/27/2022) for check swelling and fluid.  ? ? ?Beatrice Lecher, MD ? ?

## 2022-01-31 ENCOUNTER — Encounter: Payer: Self-pay | Admitting: Family Medicine

## 2022-01-31 LAB — CBC WITH DIFFERENTIAL/PLATELET
Absolute Monocytes: 537 cells/uL (ref 200–950)
Basophils Absolute: 12 cells/uL (ref 0–200)
Basophils Relative: 0.2 %
Eosinophils Absolute: 92 cells/uL (ref 15–500)
Eosinophils Relative: 1.5 %
HCT: 38.2 % (ref 35.0–45.0)
Hemoglobin: 12.5 g/dL (ref 11.7–15.5)
Lymphs Abs: 1269 cells/uL (ref 850–3900)
MCH: 30.7 pg (ref 27.0–33.0)
MCHC: 32.7 g/dL (ref 32.0–36.0)
MCV: 93.9 fL (ref 80.0–100.0)
MPV: 12.4 fL (ref 7.5–12.5)
Monocytes Relative: 8.8 %
Neutro Abs: 4191 cells/uL (ref 1500–7800)
Neutrophils Relative %: 68.7 %
Platelets: 217 10*3/uL (ref 140–400)
RBC: 4.07 10*6/uL (ref 3.80–5.10)
RDW: 13.3 % (ref 11.0–15.0)
Total Lymphocyte: 20.8 %
WBC: 6.1 10*3/uL (ref 3.8–10.8)

## 2022-01-31 LAB — COMPLETE METABOLIC PANEL WITH GFR
AG Ratio: 1.2 (calc) (ref 1.0–2.5)
ALT: 29 U/L (ref 6–29)
AST: 29 U/L (ref 10–35)
Albumin: 3.7 g/dL (ref 3.6–5.1)
Alkaline phosphatase (APISO): 102 U/L (ref 37–153)
BUN: 23 mg/dL (ref 7–25)
CO2: 27 mmol/L (ref 20–32)
Calcium: 8.9 mg/dL (ref 8.6–10.4)
Chloride: 105 mmol/L (ref 98–110)
Creat: 0.93 mg/dL (ref 0.60–0.95)
Globulin: 3.2 g/dL (calc) (ref 1.9–3.7)
Glucose, Bld: 100 mg/dL — ABNORMAL HIGH (ref 65–99)
Potassium: 3.9 mmol/L (ref 3.5–5.3)
Sodium: 140 mmol/L (ref 135–146)
Total Bilirubin: 0.8 mg/dL (ref 0.2–1.2)
Total Protein: 6.9 g/dL (ref 6.1–8.1)
eGFR: 61 mL/min/{1.73_m2} (ref >=60)

## 2022-01-31 LAB — BRAIN NATRIURETIC PEPTIDE: Brain Natriuretic Peptide: 578 pg/mL — ABNORMAL HIGH (ref <100)

## 2022-01-31 NOTE — Progress Notes (Signed)
Last test called a BNP shows that she is definitely in a heart failure episode so it is really important that we get some of this fluid pulled off.  Please let me know on Friday what her weight is at home and how much she is down before the weekend if possible.

## 2022-01-31 NOTE — Progress Notes (Signed)
Jacquetta, metabolic panel is normal.  No excess protein in the urine which is good.  And your blood count looks great hemoglobin is at 12.5.  The test called a BNP is still pending.

## 2022-02-02 ENCOUNTER — Encounter: Payer: Self-pay | Admitting: Family Medicine

## 2022-02-02 LAB — URINE CULTURE
MICRO NUMBER:: 13313884
SPECIMEN QUALITY:: ADEQUATE

## 2022-02-02 MED ORDER — SULFAMETHOXAZOLE-TRIMETHOPRIM 800-160 MG PO TABS: 1.0000 | ORAL_TABLET | Freq: Two times a day (BID) | ORAL | 0 refills | Status: DC

## 2022-02-02 NOTE — Progress Notes (Signed)
Hi Charlotte [name], sensitivities came back.   I'm going  put you on Bactrim to treat the infection it is caused by a bacteria called E. coli which is extremely common for urinary tract infections.  This is a different bacteria than the one that you had in February.

## 2022-02-02 NOTE — Progress Notes (Signed)
Hi Anjulie, your urine culture was pos for e coli.  I am still waiting for the sensitivities so I can make sure that we put you on the right medication.  That should hopefully be back by this afternoon.

## 2022-02-02 NOTE — Addendum Note (Signed)
Addended by: [name] D on: 02/02/2022 09:10 AM ? ? Modules accepted: Orders ? ?

## 2022-02-03 ENCOUNTER — Other Ambulatory Visit: Payer: Self-pay | Admitting: Cardiology

## 2022-02-06 ENCOUNTER — Ambulatory Visit: Payer: Medicare Other | Admitting: Neurology

## 2022-02-06 ENCOUNTER — Encounter: Payer: Self-pay | Admitting: Neurology

## 2022-02-06 VITALS — BP 119/68 | HR 91 | Ht 63.0 in | Wt 297.5 lb

## 2022-02-06 DIAGNOSIS — Z7189 Other specified counseling: Secondary | ICD-10-CM

## 2022-02-06 DIAGNOSIS — I5042 Chronic combined systolic (congestive) and diastolic (congestive) heart failure: Secondary | ICD-10-CM

## 2022-02-06 DIAGNOSIS — G4731 Primary central sleep apnea: Secondary | ICD-10-CM

## 2022-02-06 NOTE — Progress Notes (Signed)
? ? ?PATIENT: Charlotte [name] ?DOB: 08-08-1936 ? ?REASON FOR VISIT: follow up ?HISTORY FROM: patient ? ?Chief Complaint  ?Patient presents with  ? Follow-up  ?  RM 10. Ambulates with walker. No falls. Tolerating ASV for CHF , complex and central sleep apnea, last test in early March 2023.     ?  ? ?HISTORY OF PRESENT ILLNESS: ?Today 02/06/22 ?Charlotte [name] is a 86 y.o. female here for follow up; 02-06-2022. Her new sleep study revealed sleep apnea again. There was an error in the medical history part of the sleep study and I had the report removed, after I copied it to a telephone note. Now I found both were removed(?)>  ?The patient reports her new machine is working well and the new mask made all the difference. Good air seal now. 93 % compliance by days and 87% by  Her ASV was set up 08-12-2020. Settings are 8 cm EEPAP, min PS 4 cm and max PS of 15 cm water. Residual AHI of 4.4 /h.  Still has an air leak but it doesn't wake her.  Dry eyes improved. Still vision impaired  due to macular degeneration.  ? ?FSS at 37/ 63. Epworth sleepiness score 10/ 24 points.  ?No longer needs fully elevated head of bed , now uses a pillow at 15 degrees.  ?GERD is controlled. Rare occurrence at night now.  ? ? ? ?Last Rv in 12-01-2020.  ? seen with her daughter, seated in a regular armchair.  ?The first sleep study was actually managed a home sleep test performed on September 1 and it showed that the patient had a severe sleep apnea with 67 events per hour and over 60 minutes of low oxygenation during this home sleep test.  There were 29 central apneas per hour ?The patient presented with excessive daytime sleepiness reflected and an Epworth sleepiness score of 18 points. There was a report of a dry mouth in the morning witnessed lo ud snoring and witnessed apneas by his spouse. The patient's regular sleep routine is as follows; the patient normally goes to bed around 11 and falls asleep within 10-15 minutes he is awoken 3 times at night  and nocturia and is woken by his alarm clock at 6 in the morning. 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  Clinically relevant hypoxemia and atrial fibrillation congestive heart failure I has asked for a in lab titration which her insurance did not approve.  She was finally allowed to return on 06 July 2020 and was titrated to ASV as a final pressure of 15 over 10/4 cmH2O.  She had failed CPAP and BiPAP as well as the BiPAP ST function 2: None of them controlled central complex apnea the effective settings were 15 cm maximum pressure support 5 cm minimum pressure support and an EPAP expiratory pressure assistance pressure of 10 cmH2O.  She was given a DreamWear full flex mask and small size ?She could not use the DreamWear interface well at home, her daughter here reports that she actually had to fly and bangs of her face so there was too much air leakage.  For this reason she changed to a traditional fullface mask that covers nose and mouth. ?The new mask was send to her after a phone conversation- NO FITTING!>  ? ?Her compliance download is excellent she has used the machine 29 out of 30 days and then 25 of these days over 4 hours with an average  use at time of 6 hours 28 minutes.  The ASV has allowed for an apnea index of 0.1 there is still more shallow breathing spells, also known as hypopneas, at 10.9/h and the air leakage was still high according to this device.  I wonder if her Cheyne-Stokes at night and allows air to leak out. ? ? ? ? ? Charlotte Dapper, Charlotte [name] : today for follow up for recently diagnosed severe complex sleep apnea started on ASV therapy.  HST showed AHI 67/hr with 29 central events. She had hypoxemia for > 60 minutes. Titration study on 9/29 showed events were well controlled on ASV (Dreamwear FFM, small).  She reports that she is trying to adjust to therapy.  She had a very difficult time in the beginning with air leaking from the DreamWear mask. She has recently changed to a  different fullface mask that covers the bridge of her nose.  She feels that she is doing better with this mask.  She does continue to hear air that prevents her from being able to go to sleep. ? ?Compliance report dated 09/19/2020 through 10/18/2020 reveals that she used ASV 22 of the past 30 days for compliance of 73%.  She used ASV greater than 4 hours to of the past 30 days for compliance of 7%.  Average usage on days used was 1 hour and 27 minutes.  Residual AHI was 8.2 with a EPAP of 10 cm of water and pressure support of 4-15.  There was a significant leak noted in the 95th percentile of 37.1 L/min.   ?She was last seen by Dr Charlotte [name] for stroke follow up in 03/2020. She continues Xarelto. She was referred to GI for gastric bleeds but never seen. She has been doing well on pantoprazole. She also continues atorvastatin 40mg .  ? ? ?HISTORY: (copied from previous note) ? ?Charlotte [name] is a 86 year- old Caucasian female  Stroke patient and seen here upon referral on 05/12/2020 from Dr Charlotte [name]. ?  ?Chief concern according to patient :    ?Charlotte [name] reports excessive daytime sleepiness being more sleepy than she usually used to be.  She has very frequent nocturia and this is not a new issue but may have been present for 2-3 or even 4 years.  Multiple bathroom breaks means up to 7 nocturia's.  She also has reached a stage of physical deconditioning that makes it harder for her to not get short of breath while she walks or performs rather simple tasks.  She is using a walker for ambulation, but she reports that she sleeps because of orthopnea better than her back is elevated so a recliner is more conducive to sleep than her bed. ?Family members have not heard her snore and there is a good chance that this patient has central apnea due to her congestive heart failure history associated with atrial fibrillation.  She also may be much more unrefreshed because of anemia and chronic blood loss in the setting of chronic  anticoagulation.  She had suffered a GI bleed in the past.  There was no alternative to resuming at least some level of anticoagulation safely.  Remains on Xarelto and  No longer on ASA.  ?  ?I have the pleasure of seeing Charlotte [name] 05-12-2020, a right -handed White or Caucasian female with a possible sleep disorder.  She has a  has a past medical history of Aortic insufficiency, Atrial fibrillation (HCC), CAD (coronary artery disease), CATARACTS, Chronic systolic heart failure (  HCC), Complication of anesthesia (2009), GERD (gastroesophageal reflux disease), HYPERLIPIDEMIA, MILD, HYPERTENSION, BENIGN SYSTEMIC, Irritable bowel syndrome, LBBB (left bundle branch block), MEDIAL EPICONDYLITIS, NSVT (nonsustained ventricular tachycardia) (HCC), OSTEOARTHRITIS, OSTEOPENIA, PONV (postoperative nausea and vomiting), Pre-diabetes, RHINITIS, ALLERGIC, Stroke (HCC), SYNCOPE, and VITAMIN D DEFICIENCY. ?  ?Social history:  Patient is retired from [employer], and lives in a household with alone.  ?Family status is widowed  with adult children, her daughter is her neighbour.   She likes puzzles and sudoko. No longer reads.  ?Pets are not present. ?Tobacco use "not in 50 years ".  ETOH use ;none ,  ?Caffeine intake in form of Coffee( some) Soda( /) Tea ( some) or energy drinks. ?  ?Sleep habits are as follows: The patient's dinner time is between 6 PM.  ?The patient goes to bed at 10.30 PM and continues to sleep for one hour at a time , fragmented  hourly, wakes for nocturia- bathroom breaks. ?The preferred sleep position is in bed in a lateral or supine position, but she changes soon to the recliner-  About 4-5 AM.  ?9 AM is the usual rise time. The patient wakes up spontaneously.  ?She reports not feeling refreshed or restored in AM, with symptoms such as dry mouth and residual fatigue.  ?Naps are taken frequently,  She will doze off - lasting from 20-60 minutes and are more refreshing than nocturnal  sleep. ? ? ?REVIEW OF SYSTEMS: Out of a complete 14 system review of symptoms, the patient complains only of the following symptoms, fatigue, shob, insomnia and all other reviewed systems are negative. ? ?How likely are you to d

## 2022-02-06 NOTE — Patient Instructions (Signed)
Rv in 12 months with ASV- please bring machine and mask. ?

## 2022-02-26 ENCOUNTER — Emergency Department (HOSPITAL_COMMUNITY): Payer: Medicare Other

## 2022-02-26 ENCOUNTER — Encounter (HOSPITAL_COMMUNITY): Payer: Self-pay | Admitting: Emergency Medicine

## 2022-02-26 ENCOUNTER — Other Ambulatory Visit: Payer: Self-pay

## 2022-02-26 ENCOUNTER — Inpatient Hospital Stay (HOSPITAL_COMMUNITY)
Admission: EM | Admit: 2022-02-26 | Discharge: 2022-03-06 | DRG: 291 | Disposition: A | Discharge status: Skilled Nursing Facility | Payer: Medicare Other | Attending: Internal Medicine | Admitting: Internal Medicine

## 2022-02-26 DIAGNOSIS — Z6841 Body Mass Index (BMI) 40.0 and over, adult: Secondary | ICD-10-CM | POA: Diagnosis not present

## 2022-02-26 DIAGNOSIS — Z8719 Personal history of other diseases of the digestive system: Secondary | ICD-10-CM

## 2022-02-26 DIAGNOSIS — I2510 Atherosclerotic heart disease of native coronary artery without angina pectoris: Secondary | ICD-10-CM | POA: Diagnosis not present

## 2022-02-26 DIAGNOSIS — R7303 Prediabetes: Secondary | ICD-10-CM | POA: Diagnosis present

## 2022-02-26 DIAGNOSIS — K219 Gastro-esophageal reflux disease without esophagitis: Secondary | ICD-10-CM | POA: Diagnosis present

## 2022-02-26 DIAGNOSIS — Z8249 Family history of ischemic heart disease and other diseases of the circulatory system: Secondary | ICD-10-CM

## 2022-02-26 DIAGNOSIS — D6859 Other primary thrombophilia: Secondary | ICD-10-CM | POA: Diagnosis present

## 2022-02-26 DIAGNOSIS — R001 Bradycardia, unspecified: Secondary | ICD-10-CM | POA: Diagnosis present

## 2022-02-26 DIAGNOSIS — R079 Chest pain, unspecified: Secondary | ICD-10-CM | POA: Diagnosis not present

## 2022-02-26 DIAGNOSIS — K589 Irritable bowel syndrome without diarrhea: Secondary | ICD-10-CM | POA: Diagnosis present

## 2022-02-26 DIAGNOSIS — G8929 Other chronic pain: Secondary | ICD-10-CM | POA: Diagnosis not present

## 2022-02-26 DIAGNOSIS — I428 Other cardiomyopathies: Secondary | ICD-10-CM | POA: Diagnosis present

## 2022-02-26 DIAGNOSIS — Z8673 Personal history of transient ischemic attack (TIA), and cerebral infarction without residual deficits: Secondary | ICD-10-CM

## 2022-02-26 DIAGNOSIS — M545 Low back pain, unspecified: Secondary | ICD-10-CM | POA: Diagnosis not present

## 2022-02-26 DIAGNOSIS — E785 Hyperlipidemia, unspecified: Secondary | ICD-10-CM | POA: Diagnosis not present

## 2022-02-26 DIAGNOSIS — Z87440 Personal history of urinary (tract) infections: Secondary | ICD-10-CM

## 2022-02-26 DIAGNOSIS — E559 Vitamin D deficiency, unspecified: Secondary | ICD-10-CM | POA: Diagnosis present

## 2022-02-26 DIAGNOSIS — I5042 Chronic combined systolic (congestive) and diastolic (congestive) heart failure: Secondary | ICD-10-CM | POA: Diagnosis not present

## 2022-02-26 DIAGNOSIS — N390 Urinary tract infection, site not specified: Secondary | ICD-10-CM | POA: Diagnosis present

## 2022-02-26 DIAGNOSIS — I110 Hypertensive heart disease with heart failure: Secondary | ICD-10-CM | POA: Diagnosis not present

## 2022-02-26 DIAGNOSIS — R000 Tachycardia, unspecified: Secondary | ICD-10-CM | POA: Diagnosis not present

## 2022-02-26 DIAGNOSIS — I509 Heart failure, unspecified: Secondary | ICD-10-CM | POA: Diagnosis not present

## 2022-02-26 DIAGNOSIS — M8580 Other specified disorders of bone density and structure, unspecified site: Secondary | ICD-10-CM | POA: Diagnosis present

## 2022-02-26 DIAGNOSIS — R410 Disorientation, unspecified: Secondary | ICD-10-CM | POA: Diagnosis not present

## 2022-02-26 DIAGNOSIS — Z90710 Acquired absence of both cervix and uterus: Secondary | ICD-10-CM

## 2022-02-26 DIAGNOSIS — M549 Dorsalgia, unspecified: Secondary | ICD-10-CM | POA: Diagnosis not present

## 2022-02-26 DIAGNOSIS — T501X5A Adverse effect of loop [high-ceiling] diuretics, initial encounter: Secondary | ICD-10-CM | POA: Diagnosis not present

## 2022-02-26 DIAGNOSIS — Z833 Family history of diabetes mellitus: Secondary | ICD-10-CM

## 2022-02-26 DIAGNOSIS — I480 Paroxysmal atrial fibrillation: Secondary | ICD-10-CM | POA: Diagnosis not present

## 2022-02-26 DIAGNOSIS — I4821 Permanent atrial fibrillation: Secondary | ICD-10-CM | POA: Diagnosis not present

## 2022-02-26 DIAGNOSIS — Z7401 Bed confinement status: Secondary | ICD-10-CM | POA: Diagnosis not present

## 2022-02-26 DIAGNOSIS — E876 Hypokalemia: Secondary | ICD-10-CM | POA: Diagnosis not present

## 2022-02-26 DIAGNOSIS — M40204 Unspecified kyphosis, thoracic region: Secondary | ICD-10-CM | POA: Diagnosis not present

## 2022-02-26 DIAGNOSIS — I447 Left bundle-branch block, unspecified: Secondary | ICD-10-CM | POA: Diagnosis present

## 2022-02-26 DIAGNOSIS — J9601 Acute respiratory failure with hypoxia: Secondary | ICD-10-CM | POA: Diagnosis not present

## 2022-02-26 DIAGNOSIS — I5043 Acute on chronic combined systolic (congestive) and diastolic (congestive) heart failure: Secondary | ICD-10-CM | POA: Diagnosis not present

## 2022-02-26 DIAGNOSIS — M47816 Spondylosis without myelopathy or radiculopathy, lumbar region: Secondary | ICD-10-CM | POA: Diagnosis not present

## 2022-02-26 DIAGNOSIS — Z823 Family history of stroke: Secondary | ICD-10-CM

## 2022-02-26 DIAGNOSIS — R54 Age-related physical debility: Secondary | ICD-10-CM | POA: Diagnosis present

## 2022-02-26 DIAGNOSIS — J811 Chronic pulmonary edema: Secondary | ICD-10-CM | POA: Diagnosis not present

## 2022-02-26 DIAGNOSIS — Z79899 Other long term (current) drug therapy: Secondary | ICD-10-CM

## 2022-02-26 DIAGNOSIS — Z9181 History of falling: Secondary | ICD-10-CM | POA: Diagnosis not present

## 2022-02-26 DIAGNOSIS — R0602 Shortness of breath: Secondary | ICD-10-CM | POA: Diagnosis present

## 2022-02-26 DIAGNOSIS — Z66 Do not resuscitate: Secondary | ICD-10-CM | POA: Diagnosis present

## 2022-02-26 DIAGNOSIS — M546 Pain in thoracic spine: Secondary | ICD-10-CM | POA: Diagnosis not present

## 2022-02-26 DIAGNOSIS — G4733 Obstructive sleep apnea (adult) (pediatric): Secondary | ICD-10-CM | POA: Diagnosis not present

## 2022-02-26 DIAGNOSIS — R531 Weakness: Secondary | ICD-10-CM | POA: Diagnosis not present

## 2022-02-26 DIAGNOSIS — Z96653 Presence of artificial knee joint, bilateral: Secondary | ICD-10-CM | POA: Diagnosis present

## 2022-02-26 DIAGNOSIS — Z743 Need for continuous supervision: Secondary | ICD-10-CM | POA: Diagnosis not present

## 2022-02-26 DIAGNOSIS — R2689 Other abnormalities of gait and mobility: Secondary | ICD-10-CM | POA: Diagnosis not present

## 2022-02-26 DIAGNOSIS — Z888 Allergy status to other drugs, medicaments and biological substances status: Secondary | ICD-10-CM

## 2022-02-26 DIAGNOSIS — Z7901 Long term (current) use of anticoagulants: Secondary | ICD-10-CM

## 2022-02-26 DIAGNOSIS — Z9841 Cataract extraction status, right eye: Secondary | ICD-10-CM

## 2022-02-26 DIAGNOSIS — M6281 Muscle weakness (generalized): Secondary | ICD-10-CM | POA: Diagnosis not present

## 2022-02-26 DIAGNOSIS — D6869 Other thrombophilia: Secondary | ICD-10-CM | POA: Diagnosis not present

## 2022-02-26 DIAGNOSIS — Z9842 Cataract extraction status, left eye: Secondary | ICD-10-CM

## 2022-02-26 DIAGNOSIS — I4891 Unspecified atrial fibrillation: Secondary | ICD-10-CM | POA: Diagnosis present

## 2022-02-26 DIAGNOSIS — I4819 Other persistent atrial fibrillation: Secondary | ICD-10-CM

## 2022-02-26 DIAGNOSIS — W1830XA Fall on same level, unspecified, initial encounter: Secondary | ICD-10-CM | POA: Diagnosis present

## 2022-02-26 DIAGNOSIS — M48061 Spinal stenosis, lumbar region without neurogenic claudication: Secondary | ICD-10-CM | POA: Diagnosis not present

## 2022-02-26 DIAGNOSIS — R1312 Dysphagia, oropharyngeal phase: Secondary | ICD-10-CM | POA: Diagnosis not present

## 2022-02-26 LAB — CBC WITH DIFFERENTIAL/PLATELET
Abs Immature Granulocytes: 0.03 10*3/uL (ref 0.00–0.07)
Basophils Absolute: 0 10*3/uL (ref 0.0–0.1)
Basophils Relative: 0 %
Eosinophils Absolute: 0.1 10*3/uL (ref 0.0–0.5)
Eosinophils Relative: 1 %
HCT: 41.8 % (ref 36.0–46.0)
Hemoglobin: 13.7 g/dL (ref 12.0–15.0)
Immature Granulocytes: 0 %
Lymphocytes Relative: 19 %
Lymphs Abs: 1.6 10*3/uL (ref 0.7–4.0)
MCH: 30.8 pg (ref 26.0–34.0)
MCHC: 32.8 g/dL (ref 30.0–36.0)
MCV: 93.9 fL (ref 80.0–100.0)
Monocytes Absolute: 0.7 10*3/uL (ref 0.1–1.0)
Monocytes Relative: 8 %
Neutro Abs: 6.1 10*3/uL (ref 1.7–7.7)
Neutrophils Relative %: 72 %
Platelets: 192 10*3/uL (ref 150–400)
RBC: 4.45 MIL/uL (ref 3.87–5.11)
RDW: 14.6 % (ref 11.5–15.5)
WBC: 8.5 10*3/uL (ref 4.0–10.5)
nRBC: 0 % (ref 0.0–0.2)

## 2022-02-26 LAB — URINALYSIS, ROUTINE W REFLEX MICROSCOPIC
Bilirubin Urine: NEGATIVE
Glucose, UA: NEGATIVE mg/dL
Hgb urine dipstick: NEGATIVE
Ketones, ur: NEGATIVE mg/dL
Nitrite: POSITIVE — AB
Protein, ur: NEGATIVE mg/dL
Specific Gravity, Urine: 1.028 (ref 1.005–1.030)
pH: 5 (ref 5.0–8.0)

## 2022-02-26 LAB — COMPREHENSIVE METABOLIC PANEL
ALT: 33 U/L (ref 0–44)
AST: 28 U/L (ref 15–41)
Albumin: 3.2 g/dL — ABNORMAL LOW (ref 3.5–5.0)
Alkaline Phosphatase: 107 U/L (ref 38–126)
Anion gap: 8 (ref 5–15)
BUN: 23 mg/dL (ref 8–23)
CO2: 22 mmol/L (ref 22–32)
Calcium: 8.9 mg/dL (ref 8.9–10.3)
Chloride: 109 mmol/L (ref 98–111)
Creatinine, Ser: 0.92 mg/dL (ref 0.44–1.00)
GFR, Estimated: >60 mL/min (ref >60)
Glucose, Bld: 120 mg/dL — ABNORMAL HIGH (ref 70–99)
Potassium: 4 mmol/L (ref 3.5–5.1)
Sodium: 139 mmol/L (ref 135–145)
Total Bilirubin: 1.1 mg/dL (ref 0.3–1.2)
Total Protein: 6.9 g/dL (ref 6.5–8.1)

## 2022-02-26 LAB — BRAIN NATRIURETIC PEPTIDE: B Natriuretic Peptide: 1365.1 pg/mL — ABNORMAL HIGH (ref 0.0–100.0)

## 2022-02-26 LAB — TROPONIN I (HIGH SENSITIVITY)
Troponin I (High Sensitivity): 7 ng/L (ref <18)
Troponin I (High Sensitivity): 9 ng/L (ref <18)

## 2022-02-26 MED ORDER — DULOXETINE HCL 30 MG PO CPEP
30.0000 mg | ORAL_CAPSULE | Freq: Every day | ORAL | Status: DC
  Administered 2022-02-27 – 2022-03-06 (×8): 30 mg via ORAL
  Filled 2022-02-26 (×8): qty 1

## 2022-02-26 MED ORDER — DIGOXIN 0.25 MG/ML IJ SOLN
0.2500 mg | Freq: Four times a day (QID) | INTRAMUSCULAR | Status: AC
  Administered 2022-02-26: 0.25 mg via INTRAVENOUS
  Filled 2022-02-26: qty 2

## 2022-02-26 MED ORDER — ACETAMINOPHEN 325 MG PO TABS
650.0000 mg | ORAL_TABLET | ORAL | Status: DC | PRN
  Filled 2022-02-26: qty 2

## 2022-02-26 MED ORDER — SODIUM CHLORIDE 0.9 % IV SOLN
1.0000 g | Freq: Once | INTRAVENOUS | Status: AC
  Administered 2022-02-26: 1 g via INTRAVENOUS
  Filled 2022-02-26: qty 10

## 2022-02-26 MED ORDER — FUROSEMIDE 10 MG/ML IJ SOLN
40.0000 mg | Freq: Two times a day (BID) | INTRAMUSCULAR | Status: DC
  Administered 2022-02-26 – 2022-02-27 (×2): 40 mg via INTRAVENOUS
  Filled 2022-02-26 (×2): qty 4

## 2022-02-26 MED ORDER — LOSARTAN POTASSIUM 25 MG PO TABS
25.0000 mg | ORAL_TABLET | Freq: Every day | ORAL | Status: DC
  Administered 2022-02-27 – 2022-03-01 (×3): 25 mg via ORAL
  Filled 2022-02-26 (×3): qty 1

## 2022-02-26 MED ORDER — CARVEDILOL 12.5 MG PO TABS: 25.0000 mg | ORAL_TABLET | Freq: Two times a day (BID) | ORAL | Status: DC

## 2022-02-26 MED ORDER — POLYVINYL ALCOHOL 1.4 % OP SOLN
1.0000 [drp] | OPHTHALMIC | Status: DC | PRN
  Filled 2022-02-26: qty 15

## 2022-02-26 MED ORDER — PANTOPRAZOLE SODIUM 40 MG PO TBEC
40.0000 mg | DELAYED_RELEASE_TABLET | Freq: Every day | ORAL | Status: DC
  Administered 2022-02-26 – 2022-03-06 (×9): 40 mg via ORAL
  Filled 2022-02-26 (×9): qty 1

## 2022-02-26 MED ORDER — RIVAROXABAN 20 MG PO TABS
20.0000 mg | ORAL_TABLET | Freq: Every day | ORAL | Status: DC
  Administered 2022-02-26 – 2022-03-05 (×8): 20 mg via ORAL
  Filled 2022-02-26 (×9): qty 1

## 2022-02-26 MED ORDER — FERROUS SULFATE 325 (65 FE) MG PO TABS
325.0000 mg | ORAL_TABLET | ORAL | Status: DC
Start: 2022-02-27 — End: 2022-03-06
  Administered 2022-02-27 – 2022-03-05 (×4): 325 mg via ORAL
  Filled 2022-02-26 (×7): qty 1

## 2022-02-26 MED ORDER — SODIUM CHLORIDE 0.9% FLUSH
3.0000 mL | Freq: Two times a day (BID) | INTRAVENOUS | Status: DC
  Administered 2022-02-27 – 2022-03-06 (×14): 3 mL via INTRAVENOUS

## 2022-02-26 MED ORDER — LABETALOL HCL 5 MG/ML IV SOLN
5.0000 mg | Freq: Once | INTRAVENOUS | Status: AC
  Administered 2022-02-26: 5 mg via INTRAVENOUS
  Filled 2022-02-26: qty 4

## 2022-02-26 MED ORDER — LABETALOL HCL 5 MG/ML IV SOLN
10.0000 mg | Freq: Once | INTRAVENOUS | Status: DC
  Filled 2022-02-26: qty 4

## 2022-02-26 MED ORDER — ACETAMINOPHEN 500 MG PO TABS
1000.0000 mg | ORAL_TABLET | Freq: Three times a day (TID) | ORAL | Status: DC
  Administered 2022-02-26 – 2022-03-06 (×23): 1000 mg via ORAL
  Filled 2022-02-26 (×23): qty 2

## 2022-02-26 MED ORDER — POTASSIUM CHLORIDE CRYS ER 10 MEQ PO TBCR
10.0000 meq | EXTENDED_RELEASE_TABLET | Freq: Every day | ORAL | Status: DC
  Administered 2022-02-27 – 2022-03-04 (×6): 10 meq via ORAL
  Filled 2022-02-26 (×7): qty 1

## 2022-02-26 MED ORDER — LIDOCAINE 5 % EX PTCH
1.0000 | MEDICATED_PATCH | CUTANEOUS | Status: DC
  Administered 2022-02-26 – 2022-03-05 (×8): 1 via TRANSDERMAL
  Filled 2022-02-26 (×8): qty 1

## 2022-02-26 MED ORDER — POLYVINYL ALCOHOL-POVIDONE 1.4-0.6 % OP SOLN: 1.0000 [drp] | Freq: Every day | OPHTHALMIC | Status: DC | PRN

## 2022-02-26 MED ORDER — MORPHINE SULFATE (PF) 2 MG/ML IV SOLN
2.0000 mg | Freq: Once | INTRAVENOUS | Status: AC
  Administered 2022-02-26: 2 mg via INTRAVENOUS
  Filled 2022-02-26: qty 1

## 2022-02-26 MED ORDER — SODIUM CHLORIDE 0.9% FLUSH: 3.0000 mL | INTRAVENOUS | Status: DC | PRN

## 2022-02-26 MED ORDER — SODIUM CHLORIDE 0.9 % IV SOLN
250.0000 mL | INTRAVENOUS | Status: DC | PRN
Start: 2022-02-26 — End: 2022-03-06

## 2022-02-26 MED ORDER — DILTIAZEM HCL-DEXTROSE 125-5 MG/125ML-% IV SOLN (PREMIX)
5.0000 mg/h | INTRAVENOUS | Status: DC
  Administered 2022-02-26: 5 mg/h via INTRAVENOUS
  Filled 2022-02-26: qty 125

## 2022-02-26 MED ORDER — FUROSEMIDE 10 MG/ML IJ SOLN
20.0000 mg | Freq: Once | INTRAMUSCULAR | Status: AC
  Administered 2022-02-26: 20 mg via INTRAVENOUS
  Filled 2022-02-26: qty 2

## 2022-02-26 MED ORDER — ATORVASTATIN CALCIUM 40 MG PO TABS
40.0000 mg | ORAL_TABLET | Freq: Every day | ORAL | Status: DC
  Administered 2022-02-26 – 2022-03-05 (×7): 40 mg via ORAL
  Filled 2022-02-26 (×8): qty 1

## 2022-02-26 MED ORDER — ONDANSETRON HCL 4 MG/2ML IJ SOLN: 4.0000 mg | Freq: Four times a day (QID) | INTRAMUSCULAR | Status: DC | PRN

## 2022-02-26 NOTE — H&P (Signed)
History and Physical    RITTA HAMMES AGT:[id number] DOB: 15-May-1936 DOA: 02/26/2022  PCP: [name], MD (Confirm with patient/family/NH records and if not entered, this has to be entered at [facility] point of entry) Patient coming from: Home  I have personally briefly reviewed patient's old medical records in [place]  Chief Complaint: Palpitations shortness of breath  HPI: Charlotte [name] is a 86 y.o. female with medical history significant of PAF on Xarelto, chronic combined HFrEF with recovered LVEF and HFpEF, nonobstructive CAD, HTN, HLD, chronic back pain secondary to degenerative lumbar spine arthritis, morbid obesity, presented with palpitations and increasing shortness of breath.  Patient has had worsening of chronic back pain for 3 weeks, for which she has been taking Tylenol with minimum help.  Increasingly she has had frequent episodes of palpitations and this week she has increasing exertional dyspnea.  At baseline, she uses walker to ambulate.  Denies any chest pain no cough no fever chills.  She does notice increasing bilateral lower extremity swelling as well.  But denies any pain.  No fall.  ED Course: Patient found to be in rapid A-fib, blood pressure within normal limits.  No hypoxia.  Vasculitis showed bilateral lungs congestion, patient was started on Lasix 20 mg x 1 and Cardizem drip.  Blood work K4.0, creatinine 0.9, WBC 8.5  Review of Systems: As per HPI otherwise 14 point review of systems negative.    Past Medical History:  Diagnosis Date   Aortic insufficiency    Atrial fibrillation ([place])    a. s/p DCCV 03/2013   CAD (coronary artery disease)    LHC 06/2010: EF 55%, mild plaque in the LAD 20-30%, otherwise normal coronary arteries   CATARACTS    Chronic systolic heart failure ([place])    in setting of AFib;  Echocardiogram 02/2013: Moderate LVH, EF 25-30%, anteroseptal and apical HK, moderate AI, MAC, moderate MR, moderate LAE, mild RAE, PASP 32 => after  DCCV f/u Echo 8/14: Moderate LVH, EF 60-65%, normal wall motion, grade 1 diastolic dysfunction, moderate AI, moderate LAE   Complication of anesthesia 2009   nausea and vomitting   GERD (gastroesophageal reflux disease)    HYPERLIPIDEMIA, MILD    HYPERTENSION, BENIGN SYSTEMIC    Irritable bowel syndrome    LBBB (left bundle branch block)    MEDIAL EPICONDYLITIS    NSVT (nonsustained ventricular tachycardia) ([place])    during Dob Echo 2011 => normal cath   OSTEOARTHRITIS    OSTEOPENIA    PONV (postoperative nausea and vomiting)    Pre-diabetes    RHINITIS, ALLERGIC    Stroke ([place])    SYNCOPE    VITAMIN D DEFICIENCY     Past Surgical History:  Procedure Laterality Date   BIOPSY  03/01/2020   Procedure: BIOPSY;  Surgeon: Otis Brace, MD;  Location: [place];  Service: Gastroenterology;;   CARDIOVERSION N/A 04/01/2013   Procedure: CARDIOVERSION;  Surgeon: Josue Hector, MD;  Location: [facility] ENDOSCOPY;  Service: Cardiovascular;  Laterality: N/A;   CARDIOVERSION N/A 07/16/2017   Procedure: CARDIOVERSION;  Surgeon: Pixie Casino, MD;  Location: [facility] ENDOSCOPY;  Service: Cardiovascular;  Laterality: N/A;   CARDIOVERSION N/A 07/07/2018   Procedure: CARDIOVERSION;  Surgeon: Pixie Casino, MD;  Location: [place];  Service: Cardiovascular;  Laterality: N/A;   CATARACT EXTRACTION, BILATERAL  2012   Dr. Herbert Deaner   COLONOSCOPY WITH PROPOFOL N/A 05/23/2017   Procedure: COLONOSCOPY WITH PROPOFOL;  Surgeon: Wonda Horner, MD;  Location:  [place] ENDOSCOPY;  Service: Endoscopy;  Laterality: N/A;   ESOPHAGOGASTRODUODENOSCOPY N/A 03/01/2020   Procedure: ESOPHAGOGASTRODUODENOSCOPY (EGD);  Surgeon: Otis Brace, MD;  Location: [facility] ENDOSCOPY;  Service: Gastroenterology;  Laterality: N/A;   ESOPHAGOGASTRODUODENOSCOPY (EGD) WITH PROPOFOL N/A 05/23/2017   Procedure: ESOPHAGOGASTRODUODENOSCOPY (EGD) WITH PROPOFOL;  Surgeon: Wonda Horner, MD;  Location: [facility] ENDOSCOPY;  Service: Endoscopy;  Laterality:  N/A;   IR KYPHO LUMBAR INC FX REDUCE BONE BX UNI/BIL CANNULATION INC/IMAGING  06/06/2017   IR RADIOLOGIST EVAL & MGMT  07/01/2017   RADICAL HYSTERECTOMY     REPLACEMENT TOTAL KNEE     11/03/07 right....... 03/29/08 left     reports that she quit smoking about 51 years ago. Her smoking use included cigarettes. She has never used smokeless tobacco. She reports that she does not drink alcohol and does not use drugs.  Allergies  Allergen Reactions   Metformin And Related Other (See Comments)    Renal failure   Amlodipine Other (See Comments)    HEADACHE    Family History  Problem Relation Age of Onset   Arrhythmia Mother        afib   Alzheimer's disease Mother    Stroke Mother    Colon cancer Father    Heart attack Father 83   Diabetes Sister    Arrhythmia Sister        afib   Stroke Maternal Grandmother      Prior to Admission medications   Medication Sig Start Date End Date Taking? Authorizing Provider  acetaminophen (TYLENOL) 500 MG tablet Take 1,000 mg by mouth 3 (three) times daily.    [provider]  atorvastatin (LIPITOR) 40 MG tablet Take 1 tablet (40 mg total) by mouth daily. 06/07/21   [name], MD  carvedilol (COREG) 25 MG tablet Take 1 tablet (25 mg total) by mouth 2 (two) times daily with a meal. 09/29/21   Lelon Perla, MD  DULoxetine (CYMBALTA) 30 MG capsule TAKE 1 CAPSULE BY MOUTH DAILY 01/15/22   [name], MD  Ferrous Sulfate (IRON PO) Take 1 tablet by mouth every other day.    [provider]  furosemide (LASIX) 20 MG tablet TAKE 1 TABLET BY MOUTH TWICE A DAY 02/05/22   Crenshaw, Denice Bors, MD  Multiple Vitamins-Minerals (SYSTANE ICAPS AREDS2) CAPS Take by mouth.    [provider]  nystatin (MYCOSTATIN/NYSTOP) powder Apply 1 application topically 3 (three) times daily. 10/31/21   [name], MD  nystatin cream (MYCOSTATIN) Apply to affected area 2 times daily 10/31/21   [name], MD   omeprazole (PRILOSEC) 40 MG capsule TAKE 1 CAPSULE BY MOUTH  TWICE DAILY IN THE MORNING  AND AT BEDTIME 08/03/21   [name], MD  polyvinyl alcohol-povidone (HYPOTEARS) 1.4-0.6 % ophthalmic solution Place 1-2 drops into both eyes daily as needed (dry eyes).     [provider]  potassium chloride (KLOR-CON) 10 MEQ tablet TAKE 1 TABLET (10 MEQ TOTAL) BY MOUTH 2 (TWO) TIMES DAILY. 06/30/21   Lelon Perla, MD  rivaroxaban (XARELTO) 20 MG TABS tablet Take 1 tablet (20 mg total) by mouth daily with supper. 06/07/21   [name], MD  sulfamethoxazole-trimethoprim (BACTRIM DS) 800-160 MG tablet Take 1 tablet by mouth 2 (two) times daily. 02/02/22   [name], MD    Physical Exam: Vitals:   02/26/22 1545 02/26/22 1619 02/26/22 1730 02/26/22 1800  BP: 137/70 (!) 143/102 139/89 (!) 152/101  Pulse: 100 Marland Kitchen)  133 96 (!) 53  Resp:  _0 Temp:      TempSrc:      SpO2: 96% 96% 98% 96%    Constitutional: NAD, calm, comfortable Vitals:   02/26/22 1545 02/26/22 1619 02/26/22 1730 02/26/22 1800  BP: 137/70 (!) 143/102 139/89 (!) 152/101  Pulse: 100 (!) 133 96 (!) 53  Resp:  _1 Temp:      TempSrc:      SpO2: 96% 96% 98% 96%   Eyes: PERRL, lids and conjunctivae normal ENMT: Mucous membranes are moist. Posterior pharynx clear of any exudate or lesions.Normal dentition.  Neck: normal, supple, no masses, no thyromegaly Respiratory: clear to auscultation bilaterally, no wheezing, bilateral lower fields crackles, increasing breathing effort. no accessory muscle use.  Cardiovascular: Regular rate and rhythm, no murmurs / rubs / gallops. 2+ extremity edema. 2+ pedal pulses. No carotid bruits.  Abdomen: no tenderness, no masses palpated. No hepatosplenomegaly. Bowel sounds positive.  Musculoskeletal: no clubbing / cyanosis. No joint deformity upper and lower extremities. Good ROM, no contractures. Normal muscle tone.  Skin: no rashes, lesions, ulcers.  No induration Neurologic: CN 2-12 grossly intact. Sensation intact, DTR normal. Strength 5/5 in all 4.  Psychiatric: Normal judgment and insight. Alert and oriented x 3. Normal mood.    Labs on Admission: I have personally reviewed following labs and imaging studies  CBC: Recent Labs  Lab 02/26/22 1405  WBC 8.5  NEUTROABS 6.1  HGB 13.7  HCT 41.8  MCV 93.9  PLT 096   Basic Metabolic Panel: Recent Labs  Lab 02/26/22 1405  NA 139  K 4.0  CL 109  CO2 22  GLUCOSE 120*  BUN 23  CREATININE 0.92  CALCIUM 8.9   GFR: CrCl cannot be calculated (Unknown ideal weight.). Liver Function Tests: Recent Labs  Lab 02/26/22 1405  AST 28  ALT 33  ALKPHOS 107  BILITOT 1.1  PROT 6.9  ALBUMIN 3.2*   No results for input(s): LIPASE, AMYLASE in the last 168 hours. No results for input(s): AMMONIA in the last 168 hours. Coagulation Profile: No results for input(s): INR, PROTIME in the last 168 hours. Cardiac Enzymes: No results for input(s): CKTOTAL, CKMB, CKMBINDEX, TROPONINI in the last 168 hours. BNP (last 3 results) No results for input(s): PROBNP in the last 8760 hours. HbA1C: No results for input(s): HGBA1C in the last 72 hours. CBG: No results for input(s): GLUCAP in the last 168 hours. Lipid Profile: No results for input(s): CHOL, HDL, LDLCALC, TRIG, CHOLHDL, LDLDIRECT in the last 72 hours. Thyroid Function Tests: No results for input(s): TSH, T4TOTAL, FREET4, T3FREE, THYROIDAB in the last 72 hours. Anemia Panel: No results for input(s): VITAMINB12, FOLATE, FERRITIN, TIBC, IRON, RETICCTPCT in the last 72 hours. Urine analysis:    Component Value Date/Time   COLORURINE AMBER (A) 02/26/2022 1620   APPEARANCEUR CLOUDY (A) 02/26/2022 1620   LABSPEC 1.028 02/26/2022 1620   PHURINE 5.0 02/26/2022 1620   GLUCOSEU NEGATIVE 02/26/2022 1620   HGBUR NEGATIVE 02/26/2022 1620   BILIRUBINUR NEGATIVE 02/26/2022 1620   BILIRUBINUR negative 11/11/2021 1413   KETONESUR NEGATIVE  02/26/2022 1620   PROTEINUR NEGATIVE 02/26/2022 1620   UROBILINOGEN 1.0 11/11/2021 1413   UROBILINOGEN 0.2 03/23/2008 1050   NITRITE POSITIVE (A) 02/26/2022 1620   LEUKOCYTESUR LARGE (A) 02/26/2022 1620    Radiological Exams on Admission: CT Lumbar Spine Wo Contrast  Result Date: 02/26/2022 CLINICAL DATA:  Low back pain.  No injury. EXAM: CT LUMBAR SPINE  WITHOUT CONTRAST TECHNIQUE: Multidetector CT imaging of the lumbar spine was performed without intravenous contrast administration. Multiplanar CT image reconstructions were also generated. RADIATION DOSE REDUCTION: This exam was performed according to the departmental dose-optimization program which includes automated exposure control, adjustment of the mA and/or kV according to patient size and/or use of iterative reconstruction technique. COMPARISON:  Lumbar spine x-rays dated January 31, 2021. MRI lumbar spine dated April 19, 2018. FINDINGS: Segmentation: 5 lumbar type vertebrae. Alignment: Unchanged mild levoscoliosis. Unchanged trace anterolisthesis at L4-L5. New trace anterolisthesis at L2-L3. Vertebrae: No acute fracture focal pathologic process. Chronic L3 compression deformity status post cement augmentation. Asymmetric left-sided degenerative endplate sclerosis at B1-Q9. Paraspinal and other soft tissues: Trace right pleural effusion. Mild interlobular septal thickening in the included lung bases. Aortoiliac atherosclerotic vascular disease. Disc levels: Mild disc bulging from L1-L2 through L4-L5. Similar mild spinal canal stenosis at L1-L2 and L2-L3. Lower lumbar facet arthropathy, severe on the left at L5-S1. Multilevel neuroforaminal stenosis, severe on the right at L2-L3 and L3-L4, and moderate bilaterally at L4-L5. This has progressed since 2019. IMPRESSION: 1. No acute osseous abnormality. 2. Chronic L3 compression deformity status post cement augmentation. 3. Multilevel degenerative changes as described above, progressed since 2018. Severe  right neuroforaminal stenosis at L2-L3 and L3-L4. 4. Trace right pleural effusion with mild interstitial pulmonary edema in the included lung bases. 5. Aortic Atherosclerosis (ICD10-I70.0). Electronically Signed   By: Titus Dubin M.D.   On: 02/26/2022 15:14   DG Chest Port 1 View  Result Date: 02/26/2022 CLINICAL DATA:  Tachycardia. EXAM: PORTABLE CHEST 1 VIEW COMPARISON:  02/29/2020 FINDINGS: Cardiac enlargement. Pulmonary vascular congestion and mild diffuse interstitial edema identified. No sign of pleural effusion or airspace consolidation. Visualized osseous structures appear intact. IMPRESSION: Cardiac enlargement and mild interstitial edema. Electronically Signed   By: Kerby Moors M.D.   On: 02/26/2022 16:20    EKG: Independently reviewed.  A-fib, chronic LBBB  Assessment/Plan Principal Problem:   CHF (congestive heart failure) (HCC) Active Problems:   Atrial fibrillation (HCC)   Chronic combined systolic and diastolic congestive heart failure ([place])  (please populate well all problems here in Problem List. (For example, if patient is on BP meds at home and you resume or decide to hold them, it is a problem that needs to be her. Same for CAD, COPD, HLD and so on)  A-fib with RVR -Continue Cardizem drip -On Coreg to titrate Cardizem drip -Continue Xarelto  Acute on chronic combined systolic and diastolic CHF -With recovered LVEF as per echo done 2 years ago, significant symptoms signs of fluid overload -Current decompensation likely from uncontrolled A-fib, etiology HFpEF> HFrEF probably.  Rate control as above, increase Lasix 40 mg twice daily starting tonight. -Echocardiogram -Repeat chest x-ray tomorrow  HTN uncontrolled -Resume Coreg, increase Lasix  Acute on chronic back pain -Lumbar CT showed only chronic changes, on physical exam no focal neurologic deficit. -Continue Tylenol, avoid narcotics.  Add lidocaine patch.  Morbid obesity -Calorie control  recommended  DVT prophylaxis: Xarelto) Code Status: DNR Family Communication: None at bedside Disposition Plan: Patient sick with uncontrolled A-fib and CHF consultation, expect more than 2 midnight hospital stay. Consults called: None Admission status: PCU   Lequita Halt MD [facility] Pager [phone]  02/26/2022, 6:31 PM

## 2022-02-26 NOTE — ED Notes (Signed)
RN called to have BNP and troponin added onto pt's previous labs

## 2022-02-26 NOTE — ED Triage Notes (Signed)
Pt here via [facility] from home for back pain. Pt initially called for lifting assistance but told [facility] she had low back pain x weeks, pt reports not  being able to stand or sit, can only lay flat. Pt has not taken any meds today. 176 sbp palpated, 18RR, 96% RA, CBG 142, HR 80-120 in afib.

## 2022-02-26 NOTE — ED Provider Notes (Signed)
[facility] EMERGENCY DEPARTMENT Provider Note   CSN: [id number] Arrival date & time: 02/26/22  1120     History  Chief Complaint  Patient presents with   Back Pain    Charlotte [name] is a 86 y.o. female.  HPI  86 year old female with past medical history of chronic back pain, atrial fibrillation, CHF presents to the emergency department with acute on chronic lower back pain.  Patient endorses a mechanical fall about a week ago where she fell down onto her bottom.  Since then she has been having worsening lower back pain, most severe this morning when she could not get up.  She has also been having a difficult time making it to the bathroom in terms of urination but no genuine incontinence.  No saddle anesthesia, no numbness/weakness of her lower extremities from baseline.  No recent fever.  States that she feels overall fatigued and at times short of breath.  Home Medications Prior to Admission medications   Medication Sig Start Date End Date Taking? Authorizing Provider  acetaminophen (TYLENOL) 500 MG tablet Take 1,000 mg by mouth 3 (three) times daily.    [provider]  atorvastatin (LIPITOR) 40 MG tablet Take 1 tablet (40 mg total) by mouth daily. 06/07/21   [name], MD  carvedilol (COREG) 25 MG tablet Take 1 tablet (25 mg total) by mouth 2 (two) times daily with a meal. 09/29/21   Lelon Perla, MD  DULoxetine (CYMBALTA) 30 MG capsule TAKE 1 CAPSULE BY MOUTH DAILY 01/15/22   [name], MD  Ferrous Sulfate (IRON PO) Take 1 tablet by mouth every other day.    [provider]  furosemide (LASIX) 20 MG tablet TAKE 1 TABLET BY MOUTH TWICE A DAY 02/05/22   Crenshaw, Denice Bors, MD  Multiple Vitamins-Minerals (SYSTANE ICAPS AREDS2) CAPS Take by mouth.    [provider]  nystatin (MYCOSTATIN/NYSTOP) powder Apply 1 application topically 3 (three) times daily. 10/31/21   [name], MD  nystatin cream  (MYCOSTATIN) Apply to affected area 2 times daily 10/31/21   [name], MD  omeprazole (PRILOSEC) 40 MG capsule TAKE 1 CAPSULE BY MOUTH  TWICE DAILY IN THE MORNING  AND AT BEDTIME 08/03/21   [name], MD  polyvinyl alcohol-povidone (HYPOTEARS) 1.4-0.6 % ophthalmic solution Place 1-2 drops into both eyes daily as needed (dry eyes).     [provider]  potassium chloride (KLOR-CON) 10 MEQ tablet TAKE 1 TABLET (10 MEQ TOTAL) BY MOUTH 2 (TWO) TIMES DAILY. 06/30/21   Lelon Perla, MD  rivaroxaban (XARELTO) 20 MG TABS tablet Take 1 tablet (20 mg total) by mouth daily with supper. 06/07/21   [name], MD  sulfamethoxazole-trimethoprim (BACTRIM DS) 800-160 MG tablet Take 1 tablet by mouth 2 (two) times daily. 02/02/22   [name], MD      Allergies    Metformin and related and Amlodipine    Review of Systems   Review of Systems  Constitutional:  Positive for fatigue. Negative for fever.  Respiratory:  Positive for shortness of breath.   Cardiovascular:  Negative for chest pain.  Gastrointestinal:  Negative for abdominal pain, diarrhea and vomiting.  Genitourinary:  Positive for frequency.  Musculoskeletal:  Positive for back pain.  Skin:  Negative for rash.  Neurological:  Negative for headaches.   Physical Exam Updated Vital Signs BP (!) 143/102   Pulse (!) 133   Temp 98.3 F (36.8 C) (  Oral)   Resp 20   SpO2 96%  Physical Exam Vitals and nursing note reviewed.  Constitutional:      General: She is not in acute distress.    Appearance: Normal appearance.  HENT:     Head: Normocephalic.     Mouth/Throat:     Mouth: Mucous membranes are moist.  Cardiovascular:     Rate and Rhythm: Tachycardia present.  Pulmonary:     Effort: Pulmonary effort is normal. No respiratory distress.     Breath sounds: Rales present.  Abdominal:     Palpations: Abdomen is soft.     Tenderness: There is no abdominal tenderness.   Musculoskeletal:     Comments: Tenderness along lumbar back spine  Skin:    General: Skin is warm.  Neurological:     Mental Status: She is alert and oriented to person, place, and time. Mental status is at baseline.     Motor: No weakness.  Psychiatric:        Mood and Affect: Mood normal.    ED Results / Procedures / Treatments   Labs (all labs ordered are listed, but only abnormal results are displayed) Labs Reviewed  COMPREHENSIVE METABOLIC PANEL - Abnormal; Notable for the following components:      Result Value   Glucose, Bld 120 (*)    Albumin 3.2 (*)    All other components within normal limits  URINALYSIS, ROUTINE W REFLEX MICROSCOPIC - Abnormal; Notable for the following components:   Color, Urine AMBER (*)    APPearance CLOUDY (*)    Nitrite POSITIVE (*)    Leukocytes,Ua LARGE (*)    Bacteria, UA MANY (*)    Non Squamous Epithelial 0-5 (*)    All other components within normal limits  CBC WITH DIFFERENTIAL/PLATELET  BRAIN NATRIURETIC PEPTIDE  TROPONIN I (HIGH SENSITIVITY)  TROPONIN I (HIGH SENSITIVITY)    EKG EKG Interpretation  Date/Time:  Monday Feb 26 2022 13:44:26 EDT Ventricular Rate:  119 PR Interval:    QRS Duration: 153 QT Interval:  374 QTC Calculation: 527 R Axis:   242 Text Interpretation: Atrial fibrillation Nonspecific IVCD with LAD Anterior infarct, acute (LAD) >>> Acute MI <<< LBBB old Confirmed by Lavenia Atlas [phone]) on 02/26/2022 1:46:44 PM  Radiology CT Lumbar Spine Wo Contrast  Result Date: 02/26/2022 CLINICAL DATA:  Low back pain.  No injury. EXAM: CT LUMBAR SPINE WITHOUT CONTRAST TECHNIQUE: Multidetector CT imaging of the lumbar spine was performed without intravenous contrast administration. Multiplanar CT image reconstructions were also generated. RADIATION DOSE REDUCTION: This exam was performed according to the departmental dose-optimization program which includes automated exposure control, adjustment of the mA and/or kV  according to patient size and/or use of iterative reconstruction technique. COMPARISON:  Lumbar spine x-rays dated January 31, 2021. MRI lumbar spine dated April 19, 2018. FINDINGS: Segmentation: 5 lumbar type vertebrae. Alignment: Unchanged mild levoscoliosis. Unchanged trace anterolisthesis at L4-L5. New trace anterolisthesis at L2-L3. Vertebrae: No acute fracture focal pathologic process. Chronic L3 compression deformity status post cement augmentation. Asymmetric left-sided degenerative endplate sclerosis at 075-GRM. Paraspinal and other soft tissues: Trace right pleural effusion. Mild interlobular septal thickening in the included lung bases. Aortoiliac atherosclerotic vascular disease. Disc levels: Mild disc bulging from L1-L2 through L4-L5. Similar mild spinal canal stenosis at L1-L2 and L2-L3. Lower lumbar facet arthropathy, severe on the left at L5-S1. Multilevel neuroforaminal stenosis, severe on the right at L2-L3 and L3-L4, and moderate bilaterally at L4-L5. This has progressed since 2019. IMPRESSION: 1.  No acute osseous abnormality. 2. Chronic L3 compression deformity status post cement augmentation. 3. Multilevel degenerative changes as described above, progressed since 2018. Severe right neuroforaminal stenosis at L2-L3 and L3-L4. 4. Trace right pleural effusion with mild interstitial pulmonary edema in the included lung bases. 5. Aortic Atherosclerosis (ICD10-I70.0). Electronically Signed   By: Titus Dubin M.D.   On: 02/26/2022 15:14   DG Chest Port 1 View  Result Date: 02/26/2022 CLINICAL DATA:  Tachycardia. EXAM: PORTABLE CHEST 1 VIEW COMPARISON:  02/29/2020 FINDINGS: Cardiac enlargement. Pulmonary vascular congestion and mild diffuse interstitial edema identified. No sign of pleural effusion or airspace consolidation. Visualized osseous structures appear intact. IMPRESSION: Cardiac enlargement and mild interstitial edema. Electronically Signed   By: Kerby Moors M.D.   On: 02/26/2022 16:20     Procedures .Critical Care Performed by: Lorelle Gibbs, DO Authorized by: Lorelle Gibbs, DO   Critical care provider statement:    Critical care time (minutes):  45   Critical care was necessary to treat or prevent imminent or life-threatening deterioration of the following conditions:  Cardiac failure and respiratory failure   Critical care was time spent personally by me on the following activities:  Development of treatment plan with patient or surrogate, discussions with consultants, evaluation of patient's response to treatment, examination of patient, ordering and review of laboratory studies, ordering and review of radiographic studies, ordering and performing treatments and interventions, pulse oximetry, re-evaluation of patient's condition and review of old charts   I assumed direction of critical care for this patient from another provider in my specialty: no      Medications Ordered in ED Medications  diltiazem (CARDIZEM) 125 mg in dextrose 5% 125 mL (1 mg/mL) infusion (has no administration in time range)  cefTRIAXone (ROCEPHIN) 1 g in sodium chloride 0.9 % 100 mL IVPB (has no administration in time range)  furosemide (LASIX) injection 20 mg (has no administration in time range)  morphine (PF) 2 MG/ML injection 2 mg (2 mg Intravenous Given 02/26/22 1402)  labetalol (NORMODYNE) injection 5 mg (5 mg Intravenous Given 02/26/22 1615)    ED Course/ Medical Decision Making/ A&P Clinical Course as of 02/26/22 1704  Mon Feb 26, 2022  1649 Low back pain, fall 1 wk ago, afib RVR, UTI, pending trop and BNP [MK]    Clinical Course User Index [MK] Kommor, Debe Coder, MD                           Medical Decision Making Amount and/or Complexity of Data Reviewed Labs: ordered. Radiology: ordered.  Risk Prescription drug management.   86 year old female presents emergency department acute on chronic lower back pain from a mechanical fall.  Also with urinary frequency,  weakness and shortness of breath.  Tachycardic on arrival, atrial fibrillation which is baseline for the patient, states that she has been compliant with her medications.  Work-up shows urinary tract infection.  CT of the lumbar spine shows chronic changes, no other acute finding.  She is neuro baseline in her lower extremities, no saddle anesthesia.  The urinary incontinence has been secondary to mobility issues and does not appear to be a red flag symptom of a more acute neurologic problem.  Lower suspicion for cauda equina at this time.  Urinalysis is significant for urinary tract infection, culture sent.  CBC and CMP are otherwise reassuring without any acute abnormalities.  Patient given IV medication for atrial fibrillation/RVR on arrival.  Blood pressure  is stable.  She did become hypoxic while sleeping and is currently requiring 3 L nasal cannula but no acute respiratory failure.  Chest x-ray shows CHF changes.  We are pending BNP, troponin but anticipate admission for urinary tract infection, CHF exacerbation and uncontrolled atrial fibrillation.        Final Clinical Impression(s) / ED Diagnoses Final diagnoses:  None    Rx / DC Orders ED Discharge Orders     None         Lorelle Gibbs, DO 02/26/22 1707

## 2022-02-26 NOTE — ED Notes (Signed)
RN called to have labs, troponin and BNP, added on.

## 2022-02-26 NOTE — ED Provider Notes (Signed)
  Physical Exam  BP (!) 152/101   Pulse (!) 53   Temp 98.3 F (36.8 C) (Oral)   Resp 20   SpO2 96%   Physical Exam Vitals and nursing note reviewed.  Constitutional:      General: She is not in acute distress.    Appearance: She is well-developed.  HENT:     Head: Normocephalic and atraumatic.  Eyes:     Conjunctiva/sclera: Conjunctivae normal.  Cardiovascular:     Rate and Rhythm: Normal rate and regular rhythm.     Heart sounds: No murmur heard. Pulmonary:     Effort: Pulmonary effort is normal. No respiratory distress.     Breath sounds: Normal breath sounds.  Abdominal:     Palpations: Abdomen is soft.     Tenderness: There is no abdominal tenderness.  Musculoskeletal:        General: No swelling.     Cervical back: Neck supple.  Skin:    General: Skin is warm and dry.     Capillary Refill: Capillary refill takes less than 2 seconds.  Neurological:     Mental Status: She is alert.  Psychiatric:        Mood and Affect: Mood normal.    Procedures  Procedures  ED Course / MDM   Clinical Course as of 02/26/22 1817  Mon Feb 26, 2022  1649 Low back pain, fall 1 wk ago, afib RVR, UTI, pending trop and BNP [MK]    Clinical Course User Index [MK] Mckynleigh Mussell, Debe Coder, MD   Medical Decision Making Amount and/or Complexity of Data Reviewed Labs: ordered. Radiology: ordered.  Risk Prescription drug management.   Patient received in handoff.  Fall, back pain, UTI and fluid overload.  A-fib with RVR on diltiazem.  BNP significantly elevated.  Trope unremarkable.  Patient then admitted to the medical service.       Teressa Lower, MD 02/26/22 [phone]

## 2022-02-26 NOTE — ED Notes (Signed)
Pt returned from Ct, SpO2 was 84% on RA. RN placed pt on 2L nasal cannula, SpO2 increased to 89%, RN increased O2 to 3L, pt's SpO2 now 96%.

## 2022-02-27 ENCOUNTER — Inpatient Hospital Stay (HOSPITAL_COMMUNITY): Payer: Medicare Other

## 2022-02-27 ENCOUNTER — Ambulatory Visit: Payer: Medicare Other | Admitting: Family Medicine

## 2022-02-27 DIAGNOSIS — I5042 Chronic combined systolic (congestive) and diastolic (congestive) heart failure: Secondary | ICD-10-CM | POA: Diagnosis not present

## 2022-02-27 DIAGNOSIS — I4891 Unspecified atrial fibrillation: Secondary | ICD-10-CM | POA: Diagnosis not present

## 2022-02-27 DIAGNOSIS — I5043 Acute on chronic combined systolic (congestive) and diastolic (congestive) heart failure: Secondary | ICD-10-CM | POA: Diagnosis not present

## 2022-02-27 LAB — BASIC METABOLIC PANEL
Anion gap: 7 (ref 5–15)
BUN: 18 mg/dL (ref 8–23)
CO2: 25 mmol/L (ref 22–32)
Calcium: 8.3 mg/dL — ABNORMAL LOW (ref 8.9–10.3)
Chloride: 107 mmol/L (ref 98–111)
Creatinine, Ser: 0.9 mg/dL (ref 0.44–1.00)
GFR, Estimated: >60 mL/min (ref >60)
Glucose, Bld: 110 mg/dL — ABNORMAL HIGH (ref 70–99)
Potassium: 3.3 mmol/L — ABNORMAL LOW (ref 3.5–5.1)
Sodium: 139 mmol/L (ref 135–145)

## 2022-02-27 LAB — ECHOCARDIOGRAM COMPLETE
AR max vel: 2.34 cm2
AV Area VTI: 2.5 cm2
AV Area mean vel: 2.33 cm2
AV Mean grad: 3.3 mmHg
AV Peak grad: 6.8 mmHg
Ao pk vel: 1.3 m/s
MV M vel: 4.48 m/s
MV Peak grad: 80.3 mmHg
S' Lateral: 3.8 cm

## 2022-02-27 LAB — DIGOXIN LEVEL: Digoxin Level: 0.4 ng/mL — ABNORMAL LOW (ref 0.8–2.0)

## 2022-02-27 LAB — MAGNESIUM: Magnesium: 1.8 mg/dL (ref 1.7–2.4)

## 2022-02-27 MED ORDER — POTASSIUM CHLORIDE CRYS ER 20 MEQ PO TBCR
40.0000 meq | EXTENDED_RELEASE_TABLET | Freq: Two times a day (BID) | ORAL | Status: AC
  Administered 2022-02-28: 40 meq via ORAL
  Filled 2022-02-27 (×2): qty 2

## 2022-02-27 MED ORDER — MELATONIN 5 MG PO TABS
5.0000 mg | ORAL_TABLET | Freq: Once | ORAL | Status: DC
  Filled 2022-02-27: qty 1

## 2022-02-27 MED ORDER — QUETIAPINE FUMARATE 25 MG PO TABS
12.5000 mg | ORAL_TABLET | Freq: Once | ORAL | Status: DC
  Filled 2022-02-27: qty 1

## 2022-02-27 MED ORDER — POTASSIUM CHLORIDE CRYS ER 20 MEQ PO TBCR
40.0000 meq | EXTENDED_RELEASE_TABLET | ORAL | Status: AC
  Administered 2022-02-27 (×2): 40 meq via ORAL
  Filled 2022-02-27 (×2): qty 2

## 2022-02-27 MED ORDER — FUROSEMIDE 10 MG/ML IJ SOLN
20.0000 mg | Freq: Two times a day (BID) | INTRAMUSCULAR | Status: DC
  Administered 2022-02-27 – 2022-02-28 (×2): 20 mg via INTRAVENOUS
  Filled 2022-02-27 (×2): qty 2

## 2022-02-27 MED ORDER — FUROSEMIDE 20 MG PO TABS: 20.0000 mg | ORAL_TABLET | Freq: Two times a day (BID) | ORAL | Status: DC

## 2022-02-27 MED ORDER — CARVEDILOL 3.125 MG PO TABS
3.1250 mg | ORAL_TABLET | Freq: Two times a day (BID) | ORAL | Status: DC
  Administered 2022-02-27 (×3): 3.125 mg via ORAL
  Filled 2022-02-27 (×3): qty 1

## 2022-02-27 MED ORDER — MAGNESIUM SULFATE 2 GM/50ML IV SOLN
2.0000 g | Freq: Once | INTRAVENOUS | Status: AC
  Administered 2022-02-27: 2 g via INTRAVENOUS
  Filled 2022-02-27: qty 50

## 2022-02-27 MED ORDER — LORAZEPAM 2 MG/ML IJ SOLN
1.0000 mg | Freq: Once | INTRAMUSCULAR | Status: AC
  Administered 2022-02-27: 1 mg via INTRAVENOUS
  Filled 2022-02-27: qty 1

## 2022-02-27 MED ORDER — SODIUM CHLORIDE 0.9 % IV SOLN
2.0000 g | INTRAVENOUS | Status: DC
  Administered 2022-02-27 – 2022-03-01 (×3): 2 g via INTRAVENOUS
  Filled 2022-02-27 (×3): qty 20

## 2022-02-27 MED ORDER — FUROSEMIDE 10 MG/ML IJ SOLN
40.0000 mg | INTRAMUSCULAR | Status: DC
  Filled 2022-02-27: qty 4

## 2022-02-27 MED ORDER — PERFLUTREN LIPID MICROSPHERE
1.0000 mL | INTRAVENOUS | Status: AC | PRN
  Administered 2022-02-27: 2 mL via INTRAVENOUS

## 2022-02-27 NOTE — Progress Notes (Signed)
Received a page from bedside RN regarding the patient being bradycardic in the 40s to 50s while on cardizem drip.  Rate reduced to 5 from 7.87mcg.  Advised bedside RN to hold off on the Cardizem drip for now.  Restarted home Coreg at a lower dose 3.125 mg BID.  The patient is also on digoxin PTA which was held off last night due to the bradycardia.  Obtaining digoxin level.  Last LVEF 45-50%(05/15/18). Repeated 2D echo is pending.  If the patient has reduced LVEF less than 40%, then would avoid CCB.  We will continue to closely monitor and treat as indicated.

## 2022-02-27 NOTE — Progress Notes (Signed)
Pt refused for me to draw an arterial blood gas.  I explained to patient the difference in the blood draw and her regular labs.  Patient continued to reject procedure.  RN aware.

## 2022-02-27 NOTE — TOC Progression Note (Addendum)
Transition of Care [facility]) - Progression Note    Patient Details  Name: Charlotte [name] MRN: [id number] Date of Birth: 07/30/1936  Transition of Care [employer] [facility]) CM/SW Contact  Leone Haven, RN Phone Number: 02/27/2022, 4:04 PM  Clinical Narrative:    From home alone, her daughter lives beside her, patient has an aide from 9:30 to 12:30  Mon, Tues, Thurs and Friday ., they help her with breakfast, bathing, making bed and cleaning.  Patient states she has a walker /cane, she has no oxygen at home.  Her PCP is [name] she had an apt today but canceled the apt because she is here in the hospital.  She states her daughter will transport her home at dc.  She states she weighs herself twice a week, she eats a no salt diet, she checks her bp about once or twice a week.  She is ok with [employer] to supply oxygen if she needs it.  Awaiting PT/OT eval, patient states she does not have a preference of HH agency.   TOC will continue to follow for dc needs.    Expected Discharge Plan: Home w Home Health Services Barriers to Discharge: Continued Medical Work up  Expected Discharge Plan and Services Expected Discharge Plan: Home w Home Health Services In-house Referral: NA Discharge Planning Services: CM Consult   Living arrangements for the past 2 months: Single Family Home                   DME Agency: NA       HH Arranged: NA           Social Determinants of Health (SDOH) Interventions    Readmission Risk Interventions     View : No data to display.

## 2022-02-27 NOTE — ED Notes (Signed)
X-ray at bedside

## 2022-02-27 NOTE — Progress Notes (Signed)
Patient refusing all interventions stating that she is going home. Patient is alert only to self and time; currently believes she is in [place] at a hair salon and now needs to go to [employer]. Attempted to contact patient daughter on file; all calls automatically filter to voice mail.  ABG not obtained, lasix and potassium not given, rocephin not given at this time.

## 2022-02-27 NOTE — ED Notes (Signed)
Pt eating breakfast 

## 2022-02-27 NOTE — ED Notes (Signed)
MD Nevada Crane called this RN regarding recent page. MD told this RN to turn off Cardizem drip and ordered a smaller dose of carvedilol for pt to take. MD requests for the pt to be monitored closely and to notify if any changes.

## 2022-02-27 NOTE — ED Notes (Signed)
Lunch order placed

## 2022-02-27 NOTE — Progress Notes (Addendum)
Received a call from bedside RN regarding the patient having audible wheezing, feeling short of breath with confusion.  Reviewed the patient's chart.  Acute on chronic HFrEF with new LVEF of 30% on today's 2D echo from 45 to 50% in 2019.  BNP on admission greater than 1300.  Chest x-ray, CT scan thoracic spine revealed bilateral pleural effusions and pulmonary edema.  Currently O2 saturation 95% breathing ambient air.  Will give a dose of IV Lasix 40 mg x 1, BP 148/89.  Reviewed results of UA done on admission.  UA positive for pyuria.  Started Rocephin for presumed UTI which could contribute to her delirium.  Agitation delirium management initiated.  Frequent re-orientation, delirium precautions. Regulate sleep and wake cycle: Low dose Seroquel 12.5 mg x1 and Melatonin 5 mg x1 During the day, open blinds, OOB to chair with every shift, PT/OT/Fall precautions.

## 2022-02-27 NOTE — ED Notes (Signed)
Paged MD Margo Aye regarding pt's HR dropping to low 50's while on cardizem drip. RN Alexa titrated the drip down to 5mg /hr, but HR still dropping to low 50's. Awaiting response from MD.

## 2022-02-27 NOTE — ED Notes (Signed)
Breakfast order placed ?

## 2022-02-27 NOTE — Progress Notes (Signed)
PROGRESS NOTE    Charlotte [name]  Charlotte [name] DOB: 02/25/1936 DOA: 02/26/2022 PCP: Agapito Games, MD   Brief Narrative:  Charlotte [name] is a 86 y.o. female with medical history significant of PAF on Xarelto, chronic combined HFrEF with recovered LVEF and HFpEF, nonobstructive CAD, HTN, HLD, chronic back pain secondary to degenerative lumbar spine arthritis with distant L3 compression and kyphoplasty, obstructive sleep apnea, morbid obesity.  Patient presents after mechanical fall approximately 3 weeks ago with worsening back pain, unable to ambulate or get out of bed without profound assistance, her back pain became so bad over the past 24 hours that EMS was called and needed a Hoyer lift to get her into the gurney to bring to the ED.  She states this pain is radiating both caudally and cranially down the midline as well as somewhat laterally at her mid back and feels sharp and shocking at times with motion/position changes.    In the ED patient was noted to be tachycardic with A-fib and RVR without hypotension altered mental status or hypoxia.  Was also noted to be edematous concerning for heart failure exacerbation.  Hospitalist called for admission due to multifactorial issues including ambulatory dysfunction, intractable back pain, A-fib with RVR and heart failure exacerbation.    Of note patient chronically has back pain followed previously by orthopedic surgery at [facility] orthopedics with imaging but unremarkable work-up at that time(approximately 2 to 3 years ago).  She also notes numbness and pain distally bilaterally past her knees which appears to be somewhat chronic in nature, unclear if this is worsening acutely as her back pain is distracting her from being able to evaluate her other distal pain.  5/22: Patient admitted on Cardizem drip with subsequent bradycardia; imaging of the T-spine and L-spine unremarkable for overt acute fractures or pathology  Assessment & Plan:    Principal Problem:   CHF (congestive heart failure) (HCC) Active Problems:   Atrial fibrillation (HCC)   Chronic combined systolic and diastolic congestive heart failure (HCC)   Acute on chronic intractable back pain, questionably traumatic -T-spine and L-spine CT unremarkable for overt fracture or trauma despite pain after fall -PT OT to evaluate, if still having profound symptoms may sideline orthopedics for recommendations(previously seen by [facility] orthopedic per patient report)  Symptomatic A-fib acute on chronic with RVR -Diltiazem drip discontinued overnight due to bradycardia -Currently well controlled on carvedilol alone -Presume patient's A-fib RVR was provoked secondary to above intractable back pain given resolution with pain control while currently on home medications -Digoxin level checked overnight, low -this medication does not appear to be on her home med list at this time, unclear if was previously on or has been accidentally removed from home med list at intake.  We will need to verify with pharmacy. -Continue Xarelto   Acute hypoxic respiratory failure Chronic obstructive sleep apnea, complex -Continue to wean oxygen as tolerated, pleural effusion likely in the setting of A-fib RVR and heart failure exacerbation -Continue CPAP home unit and settings as needed, at bedtime  Acute on chronic combined systolic and diastolic CHF Concurrent hypokalemia -Continue Lasix 20 IV twice daily, follow urine output, creatinine -Hypokalemia likely provoked secondary to increased Lasix administration, follow morning labs -With recovered LVEF as per echo done 2 years ago -Continue to wean oxygen as tolerated -Current decompensation likely from uncontrolled A-fib, etiology HFpEF. -Echocardiogram pending -Repeat chest x-ray shows interval improvement in effusions   HTN uncontrolled -Continue Coreg/furosemide  Morbid obesity -Calorie control recommended  DVT prophylaxis:  Xarelto Code Status: DNR Family Communication: None present  Status is: Inpatient  Dispo: The patient is from: Home              Anticipated d/c is to: To be determined              Anticipated d/c date is: 48 to 72 hours              Patient currently not medically stable for discharge  Consultants:  None  Procedures:  None  Antimicrobials:  None indicated  Subjective: No acute issues or events over night, back pain somewhat improving, dyspnea and palpitations have resolved.  Otherwise denies nausea vomiting diarrhea constipation headache fevers chills or chest pain  Objective: Vitals:   02/27/22 0630 02/27/22 0645 02/27/22 0700 02/27/22 0715  BP: (!) 156/95 (!) 143/87 (!) 125/107 97/84  Pulse: 65 79 61 81  Resp: 15 20 18 20   Temp:      TempSrc:      SpO2: 98% 99% 99% 97%    Intake/Output Summary (Last 24 hours) at 02/27/2022 0740 Last data filed at 02/27/2022 7253 Gross per 24 hour  Intake 50.24 ml  Output 500 ml  Net -449.76 ml   There were no vitals filed for this visit.  Examination:  General exam: Appears calm and comfortable  Respiratory system: Clear to auscultation. Respiratory effort normal. Cardiovascular system: S1 & S2 heard, RRR. No JVD, murmurs, rubs, gallops or clicks. No pedal edema. Gastrointestinal system: Abdomen is nondistended, soft and nontender. No organomegaly or masses felt. Normal bowel sounds heard. Central nervous system: Alert and oriented. No focal neurological deficits. Extremities: Symmetric 5 x 5 power. Skin: No rashes, lesions or ulcers Psychiatry: Judgement and insight appear normal. Mood & affect appropriate.     Data Reviewed: I have personally reviewed following labs and imaging studies  CBC: Recent Labs  Lab 02/26/22 1405  WBC 8.5  NEUTROABS 6.1  HGB 13.7  HCT 41.8  MCV 93.9  PLT 192   Basic Metabolic Panel: Recent Labs  Lab 02/26/22 1405 02/27/22 0309  NA 139 139  K 4.0 3.3*  CL 109 107  CO2 22 25   GLUCOSE 120* 110*  BUN 23 18  CREATININE 0.92 0.90  CALCIUM 8.9 8.3*  MG  --  1.8   GFR: CrCl cannot be calculated (Unknown ideal weight.). Liver Function Tests: Recent Labs  Lab 02/26/22 1405  AST 28  ALT 33  ALKPHOS 107  BILITOT 1.1  PROT 6.9  ALBUMIN 3.2*   No results for input(s): LIPASE, AMYLASE in the last 168 hours. No results for input(s): AMMONIA in the last 168 hours. Coagulation Profile: No results for input(s): INR, PROTIME in the last 168 hours. Cardiac Enzymes: No results for input(s): CKTOTAL, CKMB, CKMBINDEX, TROPONINI in the last 168 hours. BNP (last 3 results) No results for input(s): PROBNP in the last 8760 hours. HbA1C: No results for input(s): HGBA1C in the last 72 hours. CBG: No results for input(s): GLUCAP in the last 168 hours. Lipid Profile: No results for input(s): CHOL, HDL, LDLCALC, TRIG, CHOLHDL, LDLDIRECT in the last 72 hours. Thyroid Function Tests: No results for input(s): TSH, T4TOTAL, FREET4, T3FREE, THYROIDAB in the last 72 hours. Anemia Panel: No results for input(s): VITAMINB12, FOLATE, FERRITIN, TIBC, IRON, RETICCTPCT in the last 72 hours. Sepsis Labs: No results for input(s): PROCALCITON, LATICACIDVEN in the last 168 hours.  No results found for this or any previous visit (from the past  240 hour(s)).   Radiology Studies: CT Lumbar Spine Wo Contrast  Result Date: 02/26/2022 CLINICAL DATA:  Low back pain.  No injury. EXAM: CT LUMBAR SPINE WITHOUT CONTRAST TECHNIQUE: Multidetector CT imaging of the lumbar spine was performed without intravenous contrast administration. Multiplanar CT image reconstructions were also generated. RADIATION DOSE REDUCTION: This exam was performed according to the departmental dose-optimization program which includes automated exposure control, adjustment of the mA and/or kV according to patient size and/or use of iterative reconstruction technique. COMPARISON:  Lumbar spine x-rays dated January 31, 2021. MRI  lumbar spine dated April 19, 2018. FINDINGS: Segmentation: 5 lumbar type vertebrae. Alignment: Unchanged mild levoscoliosis. Unchanged trace anterolisthesis at L4-L5. New trace anterolisthesis at L2-L3. Vertebrae: No acute fracture focal pathologic process. Chronic L3 compression deformity status post cement augmentation. Asymmetric left-sided degenerative endplate sclerosis at L1-L2. Paraspinal and other soft tissues: Trace right pleural effusion. Mild interlobular septal thickening in the included lung bases. Aortoiliac atherosclerotic vascular disease. Disc levels: Mild disc bulging from L1-L2 through L4-L5. Similar mild spinal canal stenosis at L1-L2 and L2-L3. Lower lumbar facet arthropathy, severe on the left at L5-S1. Multilevel neuroforaminal stenosis, severe on the right at L2-L3 and L3-L4, and moderate bilaterally at L4-L5. This has progressed since 2019. IMPRESSION: 1. No acute osseous abnormality. 2. Chronic L3 compression deformity status post cement augmentation. 3. Multilevel degenerative changes as described above, progressed since 2018. Severe right neuroforaminal stenosis at L2-L3 and L3-L4. 4. Trace right pleural effusion with mild interstitial pulmonary edema in the included lung bases. 5. Aortic Atherosclerosis (ICD10-I70.0). Electronically Signed   By: Obie Dredge M.D.   On: 02/26/2022 15:14   DG CHEST PORT 1 VIEW  Result Date: 02/27/2022 CLINICAL DATA:  CHF.  Back pain. EXAM: PORTABLE CHEST 1 VIEW COMPARISON:  02/26/2022 FINDINGS: 0704 hours. The cardio pericardial silhouette is enlarged. There is pulmonary vascular congestion without overt pulmonary edema. Interstitial pulmonary edema pattern seen previously has improved in the interval. No substantial pleural effusion. Telemetry leads overlie the chest. IMPRESSION: Interval improvement in interstitial pulmonary edema pattern. Electronically Signed   By: Kennith Center M.D.   On: 02/27/2022 07:27   DG Chest Port 1 View  Result Date:  02/26/2022 CLINICAL DATA:  Tachycardia. EXAM: PORTABLE CHEST 1 VIEW COMPARISON:  02/29/2020 FINDINGS: Cardiac enlargement. Pulmonary vascular congestion and mild diffuse interstitial edema identified. No sign of pleural effusion or airspace consolidation. Visualized osseous structures appear intact. IMPRESSION: Cardiac enlargement and mild interstitial edema. Electronically Signed   By: Signa Kell M.D.   On: 02/26/2022 16:20    Scheduled Meds:  acetaminophen  1,000 mg Oral TID   atorvastatin  40 mg Oral QHS   carvedilol  3.125 mg Oral BID WC   DULoxetine  30 mg Oral Daily   ferrous sulfate  325 mg Oral QODAY   furosemide  40 mg Intravenous Q12H   lidocaine  1 patch Transdermal Q24H   losartan  25 mg Oral Daily   pantoprazole  40 mg Oral Daily   potassium chloride  10 mEq Oral Daily   rivaroxaban  20 mg Oral Q supper   sodium chloride flush  3 mL Intravenous Q12H   Continuous Infusions:  sodium chloride     diltiazem (CARDIZEM) infusion 5 mg/hr (02/27/22 0034)    LOS: 1 day   Time spent:  Azucena Fallen, DO [facility]  If 7PM-7AM, please contact night-coverage [email or web address]  02/27/2022, 7:40 AM

## 2022-02-27 NOTE — Progress Notes (Signed)
PT Cancellation Note  Patient Details Name: GORGEOUS NEWLUN MRN: [id number] DOB: 02-18-1936   Cancelled Treatment:    Reason Eval/Treat Not Completed: Patient at procedure or test/unavailable Pt currently in echo. Will follow up as schedule allows.   Farley Ly, PT, DPT  Acute Rehabilitation Services  Pager: [phone] Office: [phone]    Lehman Prom 02/27/2022, 10:06 AM

## 2022-02-27 NOTE — Progress Notes (Signed)
Heart Failure Stewardship Pharmacist Progress Note   PCP: [name], MD PCP-Cardiologist: Jenkins Rouge, MD    HPI:  86 yo female with PMH significant for Afib (s/p DCCV 2014, 2018, 2019), nonobstructive CAD on LHC in 2011, chronic combined systolic and diastolic HFimpEF, chronic back pain, GERD, HLD, HTN, LBBB, NSVT in 2011, osteoarthritis, pre-DM, OSA on home CPAP, and h/o CVA.   Presents with chief complaint of back pain after fall 3 weeks ago, urinary frequency, weakness and dyspnea. Found to be in Afib with RVR and started on diltiazem gtt which was transitioned to amiodarone after developing bradycardia (HR low 50s).  Volume overload evidenced by 2+ edema and increased breathing effort on exam.  Evidence of mild interstitial pulmonary edema on CT lumbar spine and CXR; CXR also showed cardiac enlargement.    Echo 02/2013 with LVEF 25-30%, mild LVH, moderate AI / MR. Later improved to LVEF 60-65% in 2014 with new G1DD.  Echo this admission with reduced LVEF 30% with normal RV.  UA significant for positive nitrites, leukocytes, and pyuria (WBC 21-50).  No urinary symptoms outside of frequency were reported by ED provider (had received IV Lasix 1 hour prior).  No urine culture collected.  Notably, patient has history of recurrent UTI's with most recent E. Coli UTI on 04/25 that was treated with Bactrim.  Current HF Medications: Diuretic: furosemide IV 40 mg q12h Beta Blocker: carvedilol 3.125 mg twice daily ACE/ARB/ARNI: losartan 25 mg daily Other: potassium 10 mEq daily  Prior to admission HF Medications: Diuretic: furosemide 20 mg twice daily Beta blocker: carvedilol 25 mg twice daily ACE/ARB/ARNI: losartan 25 mg daily Other: potassium 10 mEq twice daily  Pertinent Lab Values: Serum creatinine 0.9, BUN 18, Potassium 3.3 (13mEq daily ordered), Sodium 139, BNP 1365.1, Magnesium 1.8, A1c 5.7% (01/2022), Digoxin 0.4 - not on PTA  Vital Signs: Weight: none recorded since  admit weight (admission weight: 282.6 lbs) Blood pressure: 130-40/80s  Heart rate: 70s - in Afib  I/O: none documented; net -197 cc  Medication Assistance / Insurance Benefits Check: Does the patient have prescription insurance?  Yes Type of insurance plan: [facility] Medicare  Outpatient Pharmacy:  Prior to admission outpatient [facility] [place] Is the patient willing to use [facility] at discharge? Pending Is the patient willing to transition their outpatient pharmacy to utilize a [facility] outpatient pharmacy?   Pending    Assessment: 1. Acute on chronic combined diastolic and systolic CHF (LVEF [id number]). NYHA class II symptoms. - Continue IV diuresis with furosemide 40 mg q12h - Continue carvedilol (see below for dosing) - Continue losartan 25 mg daily. Eventually switch to Entresto 24-26 mg BID after home carvedilol dose is restarted and BP permits - Continue potassium 10 mEq daily - No SGLT2i with history of recurrent UTI's - No MRA at this time. Will eventually add before discharge to optimize HFrEF GDMT   Plan: 1) Medication changes recommended at this time: - Give potassium IV 10 mEq x6 + 40 mEq x1 to keep K > 4 with IV diuresis - Give Mg IV 2g to keep Mg > 2 - Restart PTA carvedilol 25 mg twice daily to avoid worsening decompensation; patient reported compliance at home   2) Patient assistance: - pending patient discussion  3)  Education  - To be completed prior to discharge  Laurey Arrow, PharmD PGY1 Pharmacy Resident 02/27/2022  8:10 AM

## 2022-02-27 NOTE — Progress Notes (Signed)
PT Cancellation Note  Patient Details Name: Charlotte [name] MRN: [id number] DOB: 27-Aug-1936   Cancelled Treatment:    Reason Eval/Treat Not Completed: Medical issues which prohibited therapy Per MD, wanting to hold on PT as he is wanting further scans for her back. Will hold until medically appropriate.   Cindee Salt, DPT  Acute Rehabilitation Services  Pager: [phone] Office: [phone]    Lehman Prom 02/27/2022, 10:55 AM

## 2022-02-28 ENCOUNTER — Other Ambulatory Visit (HOSPITAL_COMMUNITY): Payer: Self-pay

## 2022-02-28 DIAGNOSIS — I4891 Unspecified atrial fibrillation: Secondary | ICD-10-CM | POA: Diagnosis not present

## 2022-02-28 DIAGNOSIS — I5042 Chronic combined systolic (congestive) and diastolic (congestive) heart failure: Secondary | ICD-10-CM | POA: Diagnosis not present

## 2022-02-28 LAB — BASIC METABOLIC PANEL
Anion gap: 9 (ref 5–15)
BUN: 22 mg/dL (ref 8–23)
CO2: 26 mmol/L (ref 22–32)
Calcium: 8.9 mg/dL (ref 8.9–10.3)
Chloride: 105 mmol/L (ref 98–111)
Creatinine, Ser: 0.84 mg/dL (ref 0.44–1.00)
GFR, Estimated: >60 mL/min (ref >60)
Glucose, Bld: 126 mg/dL — ABNORMAL HIGH (ref 70–99)
Potassium: 4.2 mmol/L (ref 3.5–5.1)
Sodium: 140 mmol/L (ref 135–145)

## 2022-02-28 LAB — CBC
HCT: 43.3 % (ref 36.0–46.0)
Hemoglobin: 13.9 g/dL (ref 12.0–15.0)
MCH: 29.9 pg (ref 26.0–34.0)
MCHC: 32.1 g/dL (ref 30.0–36.0)
MCV: 93.1 fL (ref 80.0–100.0)
Platelets: 227 10*3/uL (ref 150–400)
RBC: 4.65 MIL/uL (ref 3.87–5.11)
RDW: 14.3 % (ref 11.5–15.5)
WBC: 8.7 10*3/uL (ref 4.0–10.5)
nRBC: 0 % (ref 0.0–0.2)

## 2022-02-28 LAB — MAGNESIUM: Magnesium: 2.2 mg/dL (ref 1.7–2.4)

## 2022-02-28 MED ORDER — SPIRONOLACTONE 12.5 MG HALF TABLET
12.5000 mg | ORAL_TABLET | Freq: Every day | ORAL | Status: DC
  Administered 2022-02-28 – 2022-03-06 (×7): 12.5 mg via ORAL
  Filled 2022-02-28 (×7): qty 1

## 2022-02-28 MED ORDER — FUROSEMIDE 10 MG/ML IJ SOLN
40.0000 mg | Freq: Two times a day (BID) | INTRAMUSCULAR | Status: DC
  Administered 2022-02-28 – 2022-03-02 (×5): 40 mg via INTRAVENOUS
  Filled 2022-02-28 (×5): qty 4

## 2022-02-28 MED ORDER — LORAZEPAM 2 MG/ML IJ SOLN
0.2500 mg | Freq: Once | INTRAMUSCULAR | Status: AC
  Administered 2022-02-28: 0.25 mg via INTRAVENOUS
  Filled 2022-02-28: qty 1

## 2022-02-28 MED ORDER — ACETAMINOPHEN 10 MG/ML IV SOLN
1000.0000 mg | Freq: Once | INTRAVENOUS | Status: DC
  Filled 2022-02-28: qty 100

## 2022-02-28 MED ORDER — CARVEDILOL 6.25 MG PO TABS
6.2500 mg | ORAL_TABLET | Freq: Two times a day (BID) | ORAL | Status: DC
  Administered 2022-02-28: 6.25 mg via ORAL
  Filled 2022-02-28: qty 1

## 2022-02-28 MED ORDER — CARVEDILOL 12.5 MG PO TABS
12.5000 mg | ORAL_TABLET | Freq: Two times a day (BID) | ORAL | Status: DC
  Administered 2022-02-28 – 2022-03-01 (×2): 12.5 mg via ORAL
  Filled 2022-02-28 (×2): qty 1

## 2022-02-28 NOTE — Progress Notes (Signed)
Mobility Specialist Progress Note:   02/28/22 1328  Mobility  Activity Ambulated with assistance in room;Ambulated with assistance to bathroom  Level of Assistance Contact guard assist, steadying assist  Assistive Device Front wheel walker  Distance Ambulated (ft) 20 ft  Activity Response Tolerated well  $Mobility charge 1 Mobility   Pt received in bed willing to participate in mobility. Complaints of back pain. Ambulated to BR and was able to have BM. Left in bed with call bell in reach and all needs met.   [facility] Public librarian Phone [phone]

## 2022-02-28 NOTE — Evaluation (Signed)
Physical Therapy Evaluation Patient Details Name: Charlotte [name] MRN: [id number] DOB: 1936-06-13 Today's Date: 02/28/2022  History of Present Illness  86 yo female with onset of increasing SOB and heart palpitations was admitted 5/22.  Baseline walker to amb, but has been having increasing back pain and above symptoms. Had a mechanical fall a week before admission.  LE edema, a-fib, desaturating with mobility, lung congestion, UTI. New hypoxia as sleeping with O2 being used for this.  Has worsening of CHF.  PMHx:  PAF, CHF, CAD, HTN, back pain, lumbar and thoracic DJD, atherosclerosis, chronic L3 compression fracture  Clinical Impression  Pt was seen for mobility on side of bed with RW, after determining that she was using RW fairly regularly at home due to back pain.  Pt is unfamiliar with body mechanics, with balance strategies and did have a fall history from home.  Due to her recent challenges with falling, current pain to move out of bed and her set up with less help at home, will recommend her to go to CIR for intense recovery of strength, balance and safety awareness.  Pt is going to need considerable help given her depth of deficits and her control of mobility that is hindered by LE edema and pain in spine.  Follow acutely for goals of PT as are outlined below.       Recommendations for follow up therapy are one component of a multi-disciplinary discharge planning process, led by the attending physician.  Recommendations may be updated based on patient status, additional functional criteria and insurance authorization.  Follow Up Recommendations Acute inpatient rehab (3hours/day)    Assistance Recommended at Discharge Frequent or constant Supervision/Assistance  Patient can return home with the following  Two people to help with walking and/or transfers;A lot of help with bathing/dressing/bathroom;Assistance with cooking/housework;Direct supervision/assist for medications management;Direct  supervision/assist for financial management;Assist for transportation;Help with stairs or ramp for entrance    Equipment Recommendations None recommended by PT  Recommendations for Other Services       Functional Status Assessment Patient has had a recent decline in their functional status and demonstrates the ability to make significant improvements in function in a reasonable and predictable amount of time.     Precautions / Restrictions Precautions Precautions: Fall;Back Precaution Booklet Issued: No Precaution Comments: back for protection of chronic back fracture Restrictions Weight Bearing Restrictions: No      Mobility  Bed Mobility Overal bed mobility: Needs Assistance Bed Mobility: Supine to Sit, Sit to Supine, Rolling Rolling: Mod assist   Supine to sit: Max assist Sit to supine: Mod assist   General bed mobility comments: pt is struggling to sequence esp with LE edema making the task harder    Transfers Overall transfer level: Needs assistance Equipment used: Rolling walker (2 wheels), 1 person hand held assist Transfers: Sit to/from Stand Sit to Stand: Mod assist           General transfer comment: mod to power up to walker for sidesteps    Ambulation/Gait Ambulation/Gait assistance: Min assist Gait Distance (Feet): 7 Feet (2+5) Assistive device: Rolling walker (2 wheels), 1 person hand held assist Gait Pattern/deviations: Step-to pattern, Decreased stride length, Wide base of support, Trunk flexed Gait velocity: reduced Gait velocity interpretation: <1.31 ft/sec, indicative of household ambulator Pre-gait activities: standing balance ck General Gait Details: pt is struggling to stand upright but has a lot of chronic degenerative changes in her lumbar spine  Stairs  Wheelchair Mobility    Modified Rankin (Stroke Patients Only)       Balance Overall balance assessment: Needs assistance, History of Falls Sitting-balance  support: Feet supported Sitting balance-Leahy Scale: Fair     Standing balance support: During functional activity, Bilateral upper extremity supported Standing balance-Leahy Scale: Poor Standing balance comment: pt requires walker support to stand                             Pertinent Vitals/Pain Pain Assessment Pain Assessment: Faces Faces Pain Scale: Hurts even more Pain Location: back with standing and sidestepping Pain Descriptors / Indicators: Guarding, Grimacing Pain Intervention(s): Limited activity within patient's tolerance, Monitored during session, Repositioned    Home Living Family/patient expects to be discharged to:: Private residence Living Arrangements: Alone Available Help at Discharge: Family;Available PRN/intermittently Type of Home: House Home Access: Ramped entrance       Home Layout: Multi-level;Able to live on main level with bedroom/bathroom Home Equipment: Rolling Walker (2 wheels);Cane - single point;Grab bars - tub/shower;BSC/3in1 Additional Comments: has confusion about who is helping her and when    Prior Function Prior Level of Function : Needs assist       Physical Assist : Mobility (physical) Mobility (physical): Gait   Mobility Comments: was using RW for gait esp with back pain increased       Hand Dominance   Dominant Hand: Right    Extremity/Trunk Assessment   Upper Extremity Assessment Upper Extremity Assessment: Generalized weakness    Lower Extremity Assessment Lower Extremity Assessment: Generalized weakness    Cervical / Trunk Assessment Cervical / Trunk Assessment: Other exceptions (L3 fracture with DJD in thoracic and lumbar spines)  Communication   Communication: No difficulties  Cognition Arousal/Alertness: Awake/alert Behavior During Therapy: Flat affect Overall Cognitive Status: History of cognitive impairments - at baseline                                 General Comments: pt has  family members who are her guardians        General Comments General comments (skin integrity, edema, etc.): Pt is limited history source, but is definitely in a lot of pain to move.  Getting up to stand and sidestep is limited by both pain and weakness.  Recommending she not return home and instead go to rehab for recovery    Exercises     Assessment/Plan    PT Assessment Patient needs continued PT services  PT Problem List Decreased strength;Decreased activity tolerance;Decreased balance;Decreased mobility;Decreased coordination;Decreased knowledge of use of DME;Decreased safety awareness;Cardiopulmonary status limiting activity;Obesity;Pain;Decreased knowledge of precautions;Decreased cognition       PT Treatment Interventions DME instruction;Gait training;Functional mobility training;Therapeutic activities;Therapeutic exercise;Balance training;Neuromuscular re-education;Patient/family education    PT Goals (Current goals can be found in the Care Plan section)  Acute Rehab PT Goals Patient Stated Goal: to walk again PT Goal Formulation: With patient Time For Goal Achievement: 03/14/22 Potential to Achieve Goals: Good    Frequency Min 2X/week     Co-evaluation               AM-PAC PT "6 Clicks" Mobility  Outcome Measure Help needed turning from your back to your side while in a flat bed without using bedrails?: A Lot Help needed moving from lying on your back to sitting on the side of a flat bed without using bedrails?: A Lot  Help needed moving to and from a bed to a chair (including a wheelchair)?: A Lot Help needed standing up from a chair using your arms (e.g., wheelchair or bedside chair)?: A Lot Help needed to walk in hospital room?: A Little Help needed climbing 3-5 steps with a railing? : Total 6 Click Score: 12    End of Session Equipment Utilized During Treatment: Gait belt Activity Tolerance: Patient limited by fatigue;Patient limited by pain Patient  left: in bed;with call bell/phone within reach;with bed alarm set Nurse Communication: Mobility status;Precautions PT Visit Diagnosis: Unsteadiness on feet (R26.81);Muscle weakness (generalized) (M62.81);Difficulty in walking, not elsewhere classified (R26.2);Pain Pain - Right/Left:  (back) Pain - part of body:  (back)    Time: [id number] PT Time Calculation (min) (ACUTE ONLY): 23 min   Charges:   PT Evaluation $PT Eval Moderate Complexity: 1 Mod PT Treatments $Therapeutic Activity: 8-22 mins       Ramond Dial 02/28/2022, 12:47 PM  Mee Hives, PT PhD Acute Rehab Dept. Number: [place] [facility] [place]

## 2022-02-28 NOTE — Progress Notes (Signed)
Heart Failure Stewardship Pharmacist Progress Note   PCP: [name], MD PCP-Cardiologist: Jenkins Rouge, MD    HPI:  86 yo female with PMH significant for Afib (s/p DCCV 2014, 2018, 2019), nonobstructive CAD on LHC in 2011, chronic combined systolic and diastolic HFimpEF, chronic back pain, GERD, HLD, HTN, LBBB, NSVT in 2011, osteoarthritis, pre-DM, OSA on home CPAP, and h/o CVA.   Presents with chief complaint of back pain after fall 3 weeks ago, urinary frequency, weakness and dyspnea.  Found to be in Afib with RVR and started on diltiazem gtt which was transitioned to amiodarone after developing bradycardia (HR low 50s).  Volume overload evidenced by 2+ edema and increased breathing effort on exam.  Evidence of mild interstitial pulmonary edema on CT lumbar spine and confirm on CXR; CXR also showed cardiac enlargement.    Echo 02/2013 with LVEF 25-30%, mild LVH, moderate AI / MR. Later improved to LVEF 60-65% in 05/2013 with new G1DD.  Reduced slightly in 2015 and 2017 to LVEF 55-60%, down to 45-50% in 06/2018 and back up to 55-60% in 09/2019.  Echo this admission with newly reduced LVEF 30% and normal RV.  Notably, UA on admission was significant for positive nitrites, leukocytes, and pyuria (WBC 21-50).  No urinary symptoms outside of frequency were reported by ED provider (had received IV Lasix 1 hour prior).  No urine culture collected.  Notably, patient has history of recurrent UTI's with most recent E. Coli UTI on 04/25 that was treated with Bactrim.  Overnight 05/23, RN reported that patient was audibly wheezing with dyspnea and confusion; one dose of IV Lasix 40 mg x1 ordered by overnight MD.  Of note, Lasix was reduced from 40 mg IV BID to 20 mg BID yesterday - she received 60 mg total yesterday and the 40 mg IV dose ordered at night was not given as patient was combative.  Additional potassium 40 mEq x2 that was also ordered not given as patient refused.   Current HF  Medications: Diuretic: furosemide IV 20 mg q12h Beta Blocker: carvedilol 6.25 mg twice daily ACE/ARB/ARNI: losartan 25 mg daily Other: potassium 10 mEq daily  Prior to admission HF Medications: Diuretic: furosemide 20 mg twice daily Beta blocker: carvedilol 25 mg twice daily ACE/ARB/ARNI: losartan 25 mg daily Other: potassium 10 mEq twice daily  Pertinent Lab Values: Serum creatinine 0.84, BUN 22, Potassium 4.2 (91mEq daily), Sodium 140, BNP 1365.1, Magnesium 2.2, A1c 5.7% (01/2022), Digoxin 0.4 - not on PTA  Vital Signs: Weight: 285 lbs (admission weight: 282.6 lbs) Blood pressure: 130-40s/80-90s  Heart rate: 80-110s - in Afib  I/O: 1.8L yesterday; net -2.85L  Medication Assistance / Insurance Benefits Check: Does the patient have prescription insurance?  Yes Type of insurance plan: [facility] Medicare  Outpatient Pharmacy:  Prior to admission outpatient pharmacy: [facility] [place] Is the patient willing to use [place] at discharge? Pending Is the patient willing to transition their outpatient pharmacy to utilize a [facility] outpatient pharmacy?   Pending    Assessment: 1. Acute on chronic combined diastolic and systolic CHF (LVEF [id number]). NYHA class II symptoms. - Continue IV diuresis - increase dose as below - Continue carvedilol - see below for dosing - Continue potassium 10 mEq daily - SBP still elevated in 130-40s and renal function stable - inadequate BP control and sub-optimal HFrEF GDMT with losartan 25 mg daily - No SGLT2i with history of recurrent UTI's - No MRA at this time. Will eventually add before  discharge if BP permits after Entresto added   Plan: 1) Medication changes recommended at this time: - Increase IV furosemide to 40 mg BID - spoke with nurse to give the 40 mg dose that was ordered last night. - STOP losartan 25 mg daily. START Entresto 24-26 mg twice daily with elevated BP.  - Restart PTA carvedilol 25 mg twice daily to avoid worsening  decompensation; patient reported compliance at home   2) Patient assistance: - pending patient discussion Delene Loll / Jardiance / Farxiga = $47 copay each  3)  Education  - To be completed prior to discharge  Laurey Arrow, PharmD PGY1 Pharmacy Resident 02/28/2022  7:59 AM

## 2022-02-28 NOTE — Progress Notes (Addendum)
PROGRESS NOTE    Charlotte [name]  [id number] DOB: [birth year] DOA: 02/26/2022 PCP: [name], MD  86 y.o. female with medical history significant of PAF on Xarelto, chronic combined HFrEF with recovered LVEF and HFpEF, nonobstructive CAD, HTN, HLD, chronic back pain secondary to degenerative lumbar spine arthritis with distant L3 compression and kyphoplasty, obstructive sleep apnea, morbid obesity.  Patient presented to the ED after mechanical fall approximately 3 weeks ago with worsening back pain, unable to ambulate or get out of bed without assistance, her back pain became so bad over the past 24 hours that EMS was called and needed a Hoyer lift to get her into the gurney to bring to the ED.  She states this pain is radiating both caudally and cranially down the midline as well as somewhat laterally at her mid back and feels sharp and shocking at times with motion/position changes.   -In the ED patient was noted to be tachycardic with A-fib and RVR, also noted to be volume overloaded   Subjective: -A little confused this morning, thought she was at home -Denies any shortness of breath  Assessment and Plan:  Acute on chronic back pain, recent fall -T-spine and L-spine CT unremarkable for acute fractures -Multilevel degenerative changes noted and old fractures -PT OT consult -Continue lidocaine patch, Tylenol   A-fib with RVR -Diltiazem drip discontinued in the setting of bradycardia the night before -Continue carvedilol, will increase dose -Continue Xarelto, echo with worsening EF, will consult cardiology  Acute on chronic systolic and diastolic CHF Acute hypoxic respiratory failure  -Echo now notes drop in EF to 30% with regional wall motion abnormality, down from 55% in 12/20, pulmonary edema on admission -Continue IV Lasix today -Add Aldactone -Will request cardiology input, unclear if she is a candidate for ischemic eval -Transition losartan to Entresto in 1 to 2  days  UTI Mild delirium -Started on IV ceftriaxone overnight, follow-up urine cultures  Chronic obstructive sleep apnea, complex -Continue CPAP   HTN uncontrolled -Continue Coreg/furosemide   Morbid obesity -Lifestyle modification needed   DVT prophylaxis: Xarelto Code Status: DNR Family Communication: No family at bedside, will update Disposition Plan: To be determined, may need rehab  Consultants:  Cardiology  Procedures:   Antimicrobials:    Objective: Vitals:   02/27/22 2326 02/28/22 0255 02/28/22 0735 02/28/22 1100  BP: (!) 117/104 135/88 (!) 142/97 (!) 133/107  Pulse: (!) 114 (!) 130 (!) 106 98  Resp: 20 (!) 24 18 19   Temp: 97.8 F (36.6 C) 98.3 F (36.8 C) 97.6 F (36.4 C) 97.9 F (36.6 C)  TempSrc: Oral Oral Oral Oral  SpO2: 93% 96% 97% 97%  Weight:  129.4 kg    Height:        Intake/Output Summary (Last 24 hours) at 02/28/2022 1211 Last data filed at 02/28/2022 1136 Gross per 24 hour  Intake 490 ml  Output 2000 ml  Net -1510 ml   Filed Weights   02/27/22 1145 02/28/22 0255  Weight: 128.2 kg 129.4 kg    Examination:  General exam: Pleasant elderly female sitting up in bed, AAO x2, mild confusion HEENT: Neck obese unable to assess JVD CVS: S1-S2, regular rhythm Lungs: Few basilar rales Abdomen: Soft, obese, nontender, bowel sounds present Extremities: 2+ edema Skin: No rashes on exposed skin Psychiatry: Poor insight     Data Reviewed:   CBC: Recent Labs  Lab 02/26/22 1405 02/28/22 0557  WBC 8.5 8.7  NEUTROABS 6.1  --   HGB  13.7 13.9  HCT 41.8 43.3  MCV 93.9 93.1  PLT 192 [id number]   Basic Metabolic Panel: Recent Labs  Lab 02/26/22 1405 02/27/22 0309 02/28/22 0557  NA 139 139 140  K 4.0 3.3* 4.2  CL 109 107 105  CO2 22 25 26   GLUCOSE 120* 110* 126*  BUN 23 18 22   CREATININE 0.92 0.90 0.84  CALCIUM 8.9 8.3* 8.9  MG  --  1.8 2.2   GFR: Estimated Creatinine Clearance: 65.5 mL/min (by C-G formula based on SCr of 0.84  mg/dL). Liver Function Tests: Recent Labs  Lab 02/26/22 1405  AST 28  ALT 33  ALKPHOS 107  BILITOT 1.1  PROT 6.9  ALBUMIN 3.2*   No results for input(s): LIPASE, AMYLASE in the last 168 hours. No results for input(s): AMMONIA in the last 168 hours. Coagulation Profile: No results for input(s): INR, PROTIME in the last 168 hours. Cardiac Enzymes: No results for input(s): CKTOTAL, CKMB, CKMBINDEX, TROPONINI in the last 168 hours. BNP (last 3 results) No results for input(s): PROBNP in the last 8760 hours. HbA1C: No results for input(s): HGBA1C in the last 72 hours. CBG: No results for input(s): GLUCAP in the last 168 hours. Lipid Profile: No results for input(s): CHOL, HDL, LDLCALC, TRIG, CHOLHDL, LDLDIRECT in the last 72 hours. Thyroid Function Tests: No results for input(s): TSH, T4TOTAL, FREET4, T3FREE, THYROIDAB in the last 72 hours. Anemia Panel: No results for input(s): VITAMINB12, FOLATE, FERRITIN, TIBC, IRON, RETICCTPCT in the last 72 hours. Urine analysis:    Component Value Date/Time   COLORURINE AMBER (A) 02/26/2022 1620   APPEARANCEUR CLOUDY (A) 02/26/2022 1620   LABSPEC 1.028 02/26/2022 1620   PHURINE 5.0 02/26/2022 1620   GLUCOSEU NEGATIVE 02/26/2022 1620   HGBUR NEGATIVE 02/26/2022 1620   BILIRUBINUR NEGATIVE 02/26/2022 1620   BILIRUBINUR negative 11/11/2021 1413   KETONESUR NEGATIVE 02/26/2022 1620   PROTEINUR NEGATIVE 02/26/2022 1620   UROBILINOGEN 1.0 11/11/2021 1413   UROBILINOGEN 0.2 03/23/2008 1050   NITRITE POSITIVE (A) 02/26/2022 1620   LEUKOCYTESUR LARGE (A) 02/26/2022 1620   Sepsis Labs: @LABRCNTIP (procalcitonin:4,lacticidven:4)  )No results found for this or any previous visit (from the past 240 hour(s)).   Radiology Studies: CT THORACIC SPINE WO CONTRAST  Result Date: 02/27/2022 CLINICAL DATA:  Mid back pain with no prior imaging. Mid to upper back pain with history of falls but no recent trauma EXAM: CT THORACIC SPINE WITHOUT CONTRAST  TECHNIQUE: Multidetector CT images of the thoracic were obtained using the standard protocol without intravenous contrast. RADIATION DOSE REDUCTION: This exam was performed according to the departmental dose-optimization program which includes automated exposure control, adjustment of the mA and/or kV according to patient size and/or use of iterative reconstruction technique. COMPARISON:  None Available. FINDINGS: Alignment: Mild exaggerated thoracic kyphosis.  No listhesis. Vertebrae: No acute fracture or focal pathologic process. Paraspinal and other soft tissues: Negative. Disc levels: Less than typical degenerative changes in the thoracic spine with only mild midthoracic disc space narrowing and endplate spurring. Negative facets. No visible impingement IMPRESSION: No acute finding. Less than typical thoracic degenerative change for age. Electronically Signed   By: Jorje Guild M.D.   On: 02/27/2022 11:38   CT Lumbar Spine Wo Contrast  Result Date: 02/26/2022 CLINICAL DATA:  Low back pain.  No injury. EXAM: CT LUMBAR SPINE WITHOUT CONTRAST TECHNIQUE: Multidetector CT imaging of the lumbar spine was performed without intravenous contrast administration. Multiplanar CT image reconstructions were also generated. RADIATION DOSE REDUCTION: This exam was  performed according to the departmental dose-optimization program which includes automated exposure control, adjustment of the mA and/or kV according to patient size and/or use of iterative reconstruction technique. COMPARISON:  Lumbar spine x-rays dated January 31, 2021. MRI lumbar spine dated April 19, 2018. FINDINGS: Segmentation: 5 lumbar type vertebrae. Alignment: Unchanged mild levoscoliosis. Unchanged trace anterolisthesis at L4-L5. New trace anterolisthesis at L2-L3. Vertebrae: No acute fracture focal pathologic process. Chronic L3 compression deformity status post cement augmentation. Asymmetric left-sided degenerative endplate sclerosis at 075-GRM.  Paraspinal and other soft tissues: Trace right pleural effusion. Mild interlobular septal thickening in the included lung bases. Aortoiliac atherosclerotic vascular disease. Disc levels: Mild disc bulging from L1-L2 through L4-L5. Similar mild spinal canal stenosis at L1-L2 and L2-L3. Lower lumbar facet arthropathy, severe on the left at L5-S1. Multilevel neuroforaminal stenosis, severe on the right at L2-L3 and L3-L4, and moderate bilaterally at L4-L5. This has progressed since 2019. IMPRESSION: 1. No acute osseous abnormality. 2. Chronic L3 compression deformity status post cement augmentation. 3. Multilevel degenerative changes as described above, progressed since 2018. Severe right neuroforaminal stenosis at L2-L3 and L3-L4. 4. Trace right pleural effusion with mild interstitial pulmonary edema in the included lung bases. 5. Aortic Atherosclerosis (ICD10-I70.0). Electronically Signed   By: Titus Dubin M.D.   On: 02/26/2022 15:14   DG CHEST PORT 1 VIEW  Result Date: 02/27/2022 CLINICAL DATA:  CHF.  Back pain. EXAM: PORTABLE CHEST 1 VIEW COMPARISON:  02/26/2022 FINDINGS: 0704 hours. The cardio pericardial silhouette is enlarged. There is pulmonary vascular congestion without overt pulmonary edema. Interstitial pulmonary edema pattern seen previously has improved in the interval. No substantial pleural effusion. Telemetry leads overlie the chest. IMPRESSION: Interval improvement in interstitial pulmonary edema pattern. Electronically Signed   By: Misty Stanley M.D.   On: 02/27/2022 07:27   DG Chest Port 1 View  Result Date: 02/26/2022 CLINICAL DATA:  Tachycardia. EXAM: PORTABLE CHEST 1 VIEW COMPARISON:  02/29/2020 FINDINGS: Cardiac enlargement. Pulmonary vascular congestion and mild diffuse interstitial edema identified. No sign of pleural effusion or airspace consolidation. Visualized osseous structures appear intact. IMPRESSION: Cardiac enlargement and mild interstitial edema. Electronically Signed    By: Kerby Moors M.D.   On: 02/26/2022 16:20   ECHOCARDIOGRAM COMPLETE  Result Date: 02/27/2022    ECHOCARDIOGRAM REPORT   Patient Name:   Charlotte [name] Date of Exam: 02/27/2022 Medical Rec #:  [id number]    Height:       63.0 in Accession #:    [id number]   Weight:       297.5 lb Date of Birth:  August 13, 1936     BSA:          2.289 m Patient Age:    19 years     BP:           122/73 mmHg Patient Gender: F            HR:           74 bpm. Exam Location:  Inpatient Procedure: 2D Echo, Cardiac Doppler, Color Doppler and Intracardiac            Opacification Agent Indications:    CHF  History:        Patient has prior history of Echocardiogram examinations, most                 recent 10/08/2019. CHF, CAD, Stroke, Arrythmias:LBBB,                 Signs/Symptoms:Murmur and  Syncope; Risk Factors:Dyslipidemia and                 Hypertension. 07/07/2018 cardioversion.  Sonographer:    Luisa Hart RDCS Referring Phys: [id number] Lequita Halt  Sonographer Comments: Technically difficult study due to poor echo windows and patient is morbidly obese. Image acquisition challenging due to patient body habitus. IMPRESSIONS  1. Left ventricular ejection fraction, by estimation, is 30%. The left ventricle has severely decreased function. The left ventricle demonstrates regional wall motion abnormalities (abnormal septal motion). Left ventricular diastolic parameters are indeterminate.  2. Right ventricular systolic function is normal. The right ventricular size is normal.  3. Left atrial size was mildly dilated.  4. The mitral valve is grossly normal. No evidence of mitral valve regurgitation. The mean mitral valve gradient is 2.0 mmHg.  5. The aortic valve was not well visualized. Aortic valve regurgitation is trivial. No aortic stenosis is present. Comparison(s): Technically difficult study due to difficult PLAX views. Consider Caridac MRI if clinically indicated. FINDINGS  Left Ventricle: Left ventricular ejection fraction, by  estimation, is 30%. The left ventricle has severely decreased function. The left ventricle demonstrates regional wall motion abnormalities. Definity contrast agent was given IV to delineate the left  ventricular endocardial borders. The left ventricular internal cavity size was normal in size. Suboptimal image quality limits for assessment of left ventricular hypertrophy. Abnormal (paradoxical) septal motion, consistent with left bundle branch block. Left ventricular diastolic parameters are indeterminate.  LV Wall Scoring: The entire septum is hypokinetic. Right Ventricle: The right ventricular size is normal. No increase in right ventricular wall thickness. Right ventricular systolic function is normal. Left Atrium: Left atrial size was mildly dilated. Right Atrium: Right atrial size was normal in size. Pericardium: Trivial pericardial effusion is present. Presence of epicardial fat layer. Mitral Valve: The mitral valve is grossly normal. No evidence of mitral valve regurgitation. MV peak gradient, 6.0 mmHg. The mean mitral valve gradient is 2.0 mmHg. Tricuspid Valve: The tricuspid valve is normal in structure. Tricuspid valve regurgitation is not demonstrated. Aortic Valve: The aortic valve was not well visualized. Aortic valve regurgitation is trivial. No aortic stenosis is present. Aortic valve mean gradient measures 3.3 mmHg. Aortic valve peak gradient measures 6.8 mmHg. Aortic valve area, by VTI measures 2.50 cm. Pulmonic Valve: The pulmonic valve was not well visualized. Pulmonic valve regurgitation is not visualized. No evidence of pulmonic stenosis. Aorta: The aortic root and ascending aorta are structurally normal, with no evidence of dilitation. IAS/Shunts: No atrial level shunt detected by color flow Doppler.  LEFT VENTRICLE PLAX 2D LVIDd:         4.20 cm LVIDs:         3.80 cm LV PW:         1.50 cm LV IVS:        1.40 cm LVOT diam:     2.10 cm LV SV:         56 LV SV Index:   24 LVOT Area:     3.46  cm  RIGHT VENTRICLE RV Basal diam:  2.60 cm RV Mid diam:    2.10 cm RV S prime:     11.50 cm/s LEFT ATRIUM             Index        RIGHT ATRIUM           Index LA diam:        4.50 cm 1.97 cm/m   RA Area:  20.00 cm LA Vol (A2C):   40.9 ml 17.87 ml/m  RA Volume:   54.00 ml  23.59 ml/m LA Vol (A4C):   96.8 ml 42.29 ml/m LA Biplane Vol: 67.2 ml 29.36 ml/m  AORTIC VALVE                    PULMONIC VALVE AV Area (Vmax):    2.34 cm     PV Vmax:       0.84 m/s AV Area (Vmean):   2.33 cm     PV Peak grad:  2.8 mmHg AV Area (VTI):     2.50 cm AV Vmax:           130.00 cm/s AV Vmean:          86.600 cm/s AV VTI:            0.224 m AV Peak Grad:      6.8 mmHg AV Mean Grad:      3.3 mmHg LVOT Vmax:         87.80 cm/s LVOT Vmean:        58.350 cm/s LVOT VTI:          0.162 m LVOT/AV VTI ratio: 0.72  AORTA Ao Asc diam: 2.90 cm MITRAL VALVE              TRICUSPID VALVE MV Peak grad: 6.0 mmHg    TR Peak grad:   43.6 mmHg MV Mean grad: 2.0 mmHg    TR Vmax:        330.00 cm/s MV Vmax:      1.22 m/s MV Vmean:     67.3 cm/s   SHUNTS MR Peak grad: 80.3 mmHg   Systemic VTI:  0.16 m MR Mean grad: 52.0 mmHg   Systemic Diam: 2.10 cm MR Vmax:      448.00 cm/s MR Vmean:     342.0 cm/s Rudean Haskell MD Electronically signed by Rudean Haskell MD Signature Date/Time: 02/27/2022/11:27:08 AM    Final      Scheduled Meds:  acetaminophen  1,000 mg Oral TID   atorvastatin  40 mg Oral QHS   carvedilol  12.5 mg Oral BID WC   DULoxetine  30 mg Oral Daily   ferrous sulfate  325 mg Oral QODAY   furosemide  40 mg Intravenous BID   lidocaine  1 patch Transdermal Q24H   losartan  25 mg Oral Daily   melatonin  5 mg Oral Once   pantoprazole  40 mg Oral Daily   potassium chloride  10 mEq Oral Daily   potassium chloride  40 mEq Oral BID   QUEtiapine  12.5 mg Oral Once   rivaroxaban  20 mg Oral Q supper   sodium chloride flush  3 mL Intravenous Q12H   Continuous Infusions:  sodium chloride     cefTRIAXone (ROCEPHIN)   IV 2 g (02/27/22 2336)     LOS: 2 days    Time spent: 72min  Domenic Polite, MD [facility]   02/28/2022, 12:11 PM

## 2022-02-28 NOTE — Progress Notes (Signed)
Inpatient Rehab Admissions Coordinator:   Per PT/OT recommendations, pt was screened for CIR candidacy by Shann Medal, PT, DPT.  At this time, pt does not appear to have a diagnosis to support CIR admission.  Would recommend f/u with therapy in an alternative setting (SNF versus HH per therapy discretion).    Shann Medal, PT, DPT Admissions Coordinator [phone] 02/28/22  3:06 PM

## 2022-02-28 NOTE — Evaluation (Signed)
Occupational Therapy Evaluation Patient Details Name: Charlotte [name] MRN: [id number] DOB: 1936-03-08 Today's Date: 02/28/2022   History of Present Illness 86 yo female with onset of increasing SOB and heart palpitations was admitted 5/22.  Baseline walker to amb, but has been having increasing back pain and above symptoms. Had a mechanical fall a week before admission.  LE edema, a-fib, desaturating with mobility, lung congestion, UTI. New hypoxia as sleeping with O2 being used for this.  Has worsening of CHF.  PMHx:  PAF, CHF, CAD, HTN, back pain, lumbar and thoracic DJD, atherosclerosis, chronic L3 compression fracture   Clinical Impression   Patient admitted for the diagnosis above.  PTA she lives at home with assist as needed from family and PCA's.  Patient admits to walking with an AD, and participates with her ADL from a sit/stand level.  Family also assists with groceries and community mobility.  Pain, fatigue and confusion are impacting ADL and mobility independence.  Currently she is needing up to Mod A for ADL completion from both bedlevel and seated position, and Mod A for basic mobility.  OT will follow in the acute setting, AIR has been recommended for post acute rehab prior to returning home.        Recommendations for follow up therapy are one component of a multi-disciplinary discharge planning process, led by the attending physician.  Recommendations may be updated based on patient status, additional functional criteria and insurance authorization.   Follow Up Recommendations  Acute inpatient rehab (3hours/day)    Assistance Recommended at Discharge Frequent or constant Supervision/Assistance  Patient can return home with the following A lot of help with walking and/or transfers;A lot of help with bathing/dressing/bathroom;Assistance with cooking/housework;Help with stairs or ramp for entrance;Assist for transportation;Direct supervision/assist for financial management;Direct  supervision/assist for medications management    Functional Status Assessment  Patient has had a recent decline in their functional status and demonstrates the ability to make significant improvements in function in a reasonable and predictable amount of time.  Equipment Recommendations  None recommended by OT    Recommendations for Other Services       Precautions / Restrictions Precautions Precautions: Fall Precaution Booklet Issued: No Precaution Comments: chronic back fracture Restrictions Weight Bearing Restrictions: No      Mobility Bed Mobility Overal bed mobility: Needs Assistance Bed Mobility: Rolling Rolling: Mod assist              Transfers                          Balance Overall balance assessment: History of Falls                                         ADL either performed or assessed with clinical judgement   ADL Overall ADL's : Needs assistance/impaired     Grooming: Set up;Bed level   Upper Body Bathing: Moderate assistance;Bed level   Lower Body Bathing: Maximal assistance;Bed level   Upper Body Dressing : Moderate assistance;Sitting   Lower Body Dressing: Maximal assistance;Sitting/lateral leans                       Vision Patient Visual Report: No change from baseline       Perception Perception Perception: Not tested   Praxis Praxis Praxis: Not tested    Pertinent  Vitals/Pain Pain Assessment Faces Pain Scale: Hurts little more Pain Location: low back with mobility Pain Descriptors / Indicators: Guarding, Grimacing Pain Intervention(s): Monitored during session     Hand Dominance Right   Extremity/Trunk Assessment Upper Extremity Assessment Upper Extremity Assessment: Generalized weakness   Lower Extremity Assessment Lower Extremity Assessment: Defer to PT evaluation   Cervical / Trunk Assessment Cervical / Trunk Assessment: Other exceptions (L3 fracture with DJD in thoracic  and lumbar spines)   Communication Communication Communication: No difficulties   Cognition Arousal/Alertness: Awake/alert Behavior During Therapy: Flat affect Overall Cognitive Status: History of cognitive impairments - at baseline                                       General Comments  VSS on RA    Exercises     Shoulder Instructions      Home Living Family/patient expects to be discharged to:: Private residence Living Arrangements: Alone Available Help at Discharge: Family;Available PRN/intermittently Type of Home: House Home Access: Ramped entrance     Home Layout: Multi-level;Able to live on main level with bedroom/bathroom     Bathroom Shower/Tub: Tub/shower unit;Walk-in shower   Bathroom Toilet: Handicapped height Bathroom Accessibility: Yes How Accessible: Accessible via walker Home Equipment: Rolling Walker (2 wheels);Cane - single point;Grab bars - tub/shower;BSC/3in1   Additional Comments: has confusion about who is helping her and when      Prior Functioning/Environment Prior Level of Function : Needs assist       Physical Assist : ADLs (physical) Mobility (physical): Gait ADLs (physical): IADLs;Bathing;Dressing Mobility Comments: was using RW ADLs Comments: Generalized supervision for ADL.  PCA and family for IADL and community mobility.        OT Problem List: Decreased strength;Decreased activity tolerance;Impaired balance (sitting and/or standing);Pain;Decreased cognition;Decreased safety awareness      OT Treatment/Interventions: Self-care/ADL training;Therapeutic activities;Cognitive remediation/compensation;Patient/family education;DME and/or AE instruction;Balance training    OT Goals(Current goals can be found in the care plan section) Acute Rehab OT Goals OT Goal Formulation: Patient unable to participate in goal setting Time For Goal Achievement: 03/14/22 Potential to Achieve Goals: Good ADL Goals Pt Will Perform  Grooming: with supervision;standing Pt Will Perform Upper Body Dressing: with set-up;sitting Pt Will Perform Lower Body Dressing: with min guard assist;sit to/from stand Pt Will Transfer to Toilet: with min assist;ambulating;regular height toilet  OT Frequency: Min 2X/week    Co-evaluation              AM-PAC OT "6 Clicks" Daily Activity     Outcome Measure Help from another person eating meals?: None Help from another person taking care of personal grooming?: None Help from another person toileting, which includes using toliet, bedpan, or urinal?: A Lot Help from another person bathing (including washing, rinsing, drying)?: A Lot Help from another person to put on and taking off regular upper body clothing?: A Lot Help from another person to put on and taking off regular lower body clothing?: A Lot 6 Click Score: 16   End of Session Nurse Communication: Mobility status  Activity Tolerance: Patient limited by pain Patient left: in bed;with call bell/phone within reach;with bed alarm set  OT Visit Diagnosis: Unsteadiness on feet (R26.81);Muscle weakness (generalized) (M62.81);History of falling (Z91.81);Other symptoms and signs involving cognitive function                Time: 0037-0488 OT Time Calculation (min): 18  min Charges:  OT General Charges $OT Visit: 1 Visit OT Evaluation $OT Eval Moderate Complexity: 1 Mod  02/28/2022  RP, OTR/L  Acute Rehabilitation Services  Office:  [phone]   Suzanna Obey 02/28/2022, 1:42 PM

## 2022-02-28 NOTE — Consult Note (Addendum)
Cardiology Consultation:   Patient ID: SHANAIYA [name] MRN: [id number]; DOB: January 30, 1936  Admit date: 02/26/2022 Date of Consult: 02/28/2022  PCP:  [name], MD   [facility] Providers Cardiologist:  Jenkins Rouge, MD      Patient Profile:   Charlotte [name] is a 86 y.o. female with a hx of PAF on Xarelto, chronic combined HFrEF, nonobstructive CAD, HTN, HLD, chronic back pain secondary to degenerative lumbar spine arthritis, morbid obesity who is being seen 02/28/2022 for the evaluation of A fib, CHF at the request of Dr. Broadus John.  History of Present Illness:   Charlotte [name] is an 86 year old female with above medical history who is followed by Dr. Johnsie Cancel and Dr. Stanford Breed. Per chart review, patient underwent cardiac catheterization in September 2011 that showed mild plaque in the LAD at 20-30%, otherwise normal coronary arteries, EF 55%. Patient was noted to be in atrial fibrillation in 02/2013. Patient had an echocardiogram that showed EF 25-30%, moderate LVH. Underwent DCCV on 04/01/2013. Follow up echocardiogram showed moderate AR, return to normal LV function with EF 60-65%.  Patient was admitted to the hospital in August 2018 for a GI bleed. Had a follow up appointment with cardiology on 07/12/2017 where an EKG showed patient was back in atrial fibrillation. Patient underwent successful DCCV on 07/16/2018 and required 3 shocks to convert. Patient was later seen by cardiology in 05/2018 and was found to be back in afib. Patient underwent nuclear stress test further evaluation. Nuclear stress test in 05/2018 showed EF 48% with fixed anteroseptal defect felt secondary to LBBB. There were no signs of ischemia. Follow up echocardiogram in 09/2019 showed normal LV function, mild LVH, grade I diastolic dysfunction, mild aortic insufficiency.   Patient was last seen by cardiology on 09/11/2021. At this appointment, patient complained of dyspnea on exertion. Patient was found to have an elevated HR on  EKG. Wore a 3 day zio monitor in 09/2021 that showed continuous atrial fibrillation, HR was elevated at times. Carvedilol was increased.   Patient presented to the ED on 5/22 complaining of back pain that preventing her from sitting or standing. Reported only being able to lay flat. She also complained of palpitations and exertional dyspnea. Patient was found to be hypoxic on presentation, and was started on 3L via [place]. EKG in the ED showed atrial fibrillation, HR 119, LBBB (old). CXR showed mild interstitial edema. hsTn 7>>9. BNP elevated to 1365.1. Patient was started on IV diltiazem and IV lasix 40 mg IV. Admitted to the hospitalist service for treatment of afib with RVR, acute on chronic combined systolic and diastolic CHF, and chronic back pain. Patient was found to have a UTI and developed delirium.   On interview, patient is oriented to person, and time, not place. Denies having any chest pain. Denies SOB.   Past Medical History:  Diagnosis Date   Aortic insufficiency    Atrial fibrillation ([place])    a. s/p DCCV 03/2013   CAD (coronary artery disease)    LHC 06/2010: EF 55%, mild plaque in the LAD 20-30%, otherwise normal coronary arteries   CATARACTS    Chronic systolic heart failure ([place])    in setting of AFib;  Echocardiogram 02/2013: Moderate LVH, EF 25-30%, anteroseptal and apical HK, moderate AI, MAC, moderate MR, moderate LAE, mild RAE, PASP 32 => after DCCV f/u Echo 8/14: Moderate LVH, EF 60-65%, normal wall motion, grade 1 diastolic dysfunction, moderate AI, moderate LAE   Complication of anesthesia 2009  nausea and vomitting   GERD (gastroesophageal reflux disease)    HYPERLIPIDEMIA, MILD    HYPERTENSION, BENIGN SYSTEMIC    Irritable bowel syndrome    LBBB (left bundle branch block)    MEDIAL EPICONDYLITIS    NSVT (nonsustained ventricular tachycardia) (HCC)    during Dob Echo 2011 => normal cath   OSTEOARTHRITIS    OSTEOPENIA    PONV (postoperative nausea and vomiting)     Pre-diabetes    RHINITIS, ALLERGIC    Stroke ([place])    SYNCOPE    VITAMIN D DEFICIENCY     Past Surgical History:  Procedure Laterality Date   BIOPSY  03/01/2020   Procedure: BIOPSY;  Surgeon: Otis Brace, MD;  Location: [facility];  Service: Gastroenterology;;   CARDIOVERSION N/A 04/01/2013   Procedure: CARDIOVERSION;  Surgeon: Josue Hector, MD;  Location: [facility] ENDOSCOPY;  Service: Cardiovascular;  Laterality: N/A;   CARDIOVERSION N/A 07/16/2017   Procedure: CARDIOVERSION;  Surgeon: Pixie Casino, MD;  Location: [facility] ENDOSCOPY;  Service: Cardiovascular;  Laterality: N/A;   CARDIOVERSION N/A 07/07/2018   Procedure: CARDIOVERSION;  Surgeon: Pixie Casino, MD;  Location: [facility];  Service: Cardiovascular;  Laterality: N/A;   CATARACT EXTRACTION, BILATERAL  2012   Dr. Herbert Deaner   COLONOSCOPY WITH PROPOFOL N/A 05/23/2017   Procedure: COLONOSCOPY WITH PROPOFOL;  Surgeon: Wonda Horner, MD;  Location: [facility] ENDOSCOPY;  Service: Endoscopy;  Laterality: N/A;   ESOPHAGOGASTRODUODENOSCOPY N/A 03/01/2020   Procedure: ESOPHAGOGASTRODUODENOSCOPY (EGD);  Surgeon: Otis Brace, MD;  Location[facility] ENDOSCOPY;  Service: Gastroenterology;  Laterality: N/A;   ESOPHAGOGASTRODUODENOSCOPY (EGD) WITH PROPOFOL N/A 05/23/2017   Procedure: ESOPHAGOGASTRODUODENOSCOPY (EGD) WITH PROPOFOL;  Surgeon: Wonda Horner, MD;  Location: [facility] ENDOSCOPY;  Service: Endoscopy;  Laterality: N/A;   IR KYPHO LUMBAR INC FX REDUCE BONE BX UNI/BIL CANNULATION INC/IMAGING  06/06/2017   IR RADIOLOGIST EVAL & MGMT  07/01/2017   RADICAL HYSTERECTOMY     REPLACEMENT TOTAL KNEE     11/03/07 right....... 03/29/08 left     Home Medications:  Prior to Admission medications   Medication Sig Start Date End Date Taking? Authorizing Provider  acetaminophen (TYLENOL) 500 MG tablet Take 1,000 mg by mouth in the morning and at bedtime.   Yes [provider]  atorvastatin (LIPITOR) 40 MG tablet Take 1 tablet (40 mg total) by mouth  daily. 06/07/21  Yes [name], MD  carvedilol (COREG) 25 MG tablet Take 1 tablet (25 mg total) by mouth 2 (two) times daily with a meal. 09/29/21  Yes Crenshaw, Denice Bors, MD  DULoxetine (CYMBALTA) 30 MG capsule TAKE 1 CAPSULE BY MOUTH DAILY 01/15/22  Yes [name], MD  Ferrous Sulfate (IRON PO) Take 1 tablet by mouth every other day.   Yes [provider]  furosemide (LASIX) 20 MG tablet TAKE 1 TABLET BY MOUTH TWICE A DAY 02/05/22  Yes Crenshaw, Denice Bors, MD  losartan (COZAAR) 25 MG tablet Take 25 mg by mouth daily.   Yes [provider]  Multiple Vitamins-Minerals (SYSTANE ICAPS AREDS2) CAPS Take 1 capsule by mouth daily.   Yes [provider]  nystatin (MYCOSTATIN/NYSTOP) powder Apply 1 application topically 3 (three) times daily. 10/31/21  Yes [name], MD  omeprazole (PRILOSEC) 40 MG capsule TAKE 1 CAPSULE BY MOUTH  TWICE DAILY IN THE MORNING  AND AT BEDTIME 08/03/21  Yes [name], MD  potassium chloride (KLOR-CON) 10 MEQ tablet TAKE 1 TABLET (10 MEQ TOTAL) BY MOUTH 2 (TWO) TIMES  DAILY. 06/30/21  Yes Lelon Perla, MD  rivaroxaban (XARELTO) 20 MG TABS tablet Take 1 tablet (20 mg total) by mouth daily with supper. 06/07/21  Yes [name], MD  nystatin cream (MYCOSTATIN) Apply to affected area 2 times daily 10/31/21   [name], MD  sulfamethoxazole-trimethoprim (BACTRIM DS) 800-160 MG tablet Take 1 tablet by mouth 2 (two) times daily. Patient not taking: Reported on 02/26/2022 02/02/22   [name], MD    Inpatient Medications: Scheduled Meds:  acetaminophen  1,000 mg Oral TID   atorvastatin  40 mg Oral QHS   carvedilol  12.5 mg Oral BID WC   DULoxetine  30 mg Oral Daily   ferrous sulfate  325 mg Oral QODAY   furosemide  40 mg Intravenous BID   lidocaine  1 patch Transdermal Q24H   losartan  25 mg Oral Daily   melatonin  5 mg Oral Once   pantoprazole  40 mg Oral Daily   potassium  chloride  10 mEq Oral Daily   potassium chloride  40 mEq Oral BID   QUEtiapine  12.5 mg Oral Once   rivaroxaban  20 mg Oral Q supper   sodium chloride flush  3 mL Intravenous Q12H   Continuous Infusions:  sodium chloride     cefTRIAXone (ROCEPHIN)  IV 2 g (02/27/22 2336)   PRN Meds: sodium chloride, acetaminophen, ondansetron (ZOFRAN) IV, polyvinyl alcohol, sodium chloride flush  Allergies:    Allergies  Allergen Reactions   Metformin And Related Other (See Comments)    Renal failure   Amlodipine Other (See Comments)    HEADACHE    Social History:   Social History   Socioeconomic History   Marital status: Divorced    Spouse name: Not on file   Number of children: 1   Years of education: 12   Highest education level: High school graduate  Occupational History   Occupation: Retired    Fish farm manager: RETIRED  Tobacco Use   Smoking status: Former    Types: Cigarettes    Quit date: 10/08/1970    Years since quitting: 51.4   Smokeless tobacco: Never  Vaping Use   Vaping Use: Never used  Substance and Sexual Activity   Alcohol use: No   Drug use: No   Sexual activity: Not on file  Other Topics Concern   Not on file  Social History Narrative   Lives by herself at home, next to her daughter. She enjoys watching tv in her free time.   Social Determinants of Health   Financial Resource Strain: Not on file  Food Insecurity: Not on file  Transportation Needs: Not on file  Physical Activity: Not on file  Stress: Not on file  Social Connections: Not on file  Intimate Partner Violence: Not on file    Family History:    Family History  Problem Relation Age of Onset   Arrhythmia Mother        afib   Alzheimer's disease Mother    Stroke Mother    Colon cancer Father    Heart attack Father 12   Diabetes Sister    Arrhythmia Sister        afib   Stroke Maternal Grandmother      ROS:  Please see the history of present illness.   All other ROS reviewed and negative.      Physical Exam/Data:   Vitals:   02/27/22 2326 02/28/22 0255 02/28/22 0735 02/28/22 1100  BP: Marland Kitchen)  117/104 135/88 (!) 142/97 (!) 133/107  Pulse: (!) 114 (!) 130 (!) 106 98  Resp: 20 (!) _0 Temp: 97.8 F (36.6 C) 98.3 F (36.8 C) 97.6 F (36.4 C) 97.9 F (36.6 C)  TempSrc: Oral Oral Oral Oral  SpO2: 93% 96% 97% 97%  Weight:  129.4 kg    Height:        Intake/Output Summary (Last 24 hours) at 02/28/2022 1200 Last data filed at 02/28/2022 1136 Gross per 24 hour  Intake 490 ml  Output 2000 ml  Net -1510 ml      02/28/2022    2:55 AM 02/27/2022   11:45 AM 02/06/2022   12:55 PM  Last 3 Weights  Weight (lbs) 285 lb 4.4 oz 282 lb 10.1 oz 297 lb 8 oz  Weight (kg) 129.4 kg 128.2 kg 134.945 kg     Body mass index is 50.55 kg/m.  General:  Obese elderly female laying in the bed in no acute distress HEENT: normal Neck: Unable to assess JVD due to body habitus  Vascular: Radial pulses 2+ bilaterally Cardiac:  normal S1, S2; irregular rate and rhythm, no murmurs  Lungs:  Diminished breath sounds in bilateral lung bases  Abd: soft, nontender, no hepatomegaly  Ext: 2+ edema superimposed on large baseline body habitus  Musculoskeletal:  No deformities, BUE and BLE strength normal and equal Skin: warm and dry  Neuro:  CNs 2-12 intact. Oriented to person and time, not place  Psych:  Normal affect   EKG:  The EKG was personally reviewed and demonstrates:  Atrial fibrillation, HR 119, LBBB Telemetry:  Telemetry was personally reviewed and demonstrates:  Atrial fibrillation, HR 90s-120s   Relevant CV Studies:   Laboratory Data:  High Sensitivity Troponin:   Recent Labs  Lab 02/26/22 1405 02/26/22 1742  TROPONINIHS 7 9     Chemistry Recent Labs  Lab 02/26/22 1405 02/27/22 0309 02/28/22 0557  NA 139 139 140  K 4.0 3.3* 4.2  CL 109 107 105  CO2 _1 GLUCOSE 120* 110* 126*  BUN _2 CREATININE 0.92 0.90 0.84  CALCIUM 8.9 8.3* 8.9  MG  --  1.8 2.2   GFRNONAA >60 >60 >60  ANIONGAP _3 Recent Labs  Lab 02/26/22 1405  PROT 6.9  ALBUMIN 3.2*  AST 28  ALT 33  ALKPHOS 107  BILITOT 1.1   Lipids No results for input(s): CHOL, TRIG, HDL, LABVLDL, LDLCALC, CHOLHDL in the last 168 hours.  Hematology Recent Labs  Lab 02/26/22 1405 02/28/22 0557  WBC 8.5 8.7  RBC 4.45 4.65  HGB 13.7 13.9  HCT 41.8 43.3  MCV 93.9 93.1  MCH 30.8 29.9  MCHC 32.8 32.1  RDW 14.6 14.3  PLT 192 227   Thyroid No results for input(s): TSH, FREET4 in the last 168 hours.  BNP Recent Labs  Lab 02/26/22 1405  BNP 1,365.1*    DDimer No results for input(s): DDIMER in the last 168 hours.   Radiology/Studies:  CT THORACIC SPINE WO CONTRAST  Result Date: 02/27/2022 CLINICAL DATA:  Mid back pain with no prior imaging. Mid to upper back pain with history of falls but no recent trauma EXAM: CT THORACIC SPINE WITHOUT CONTRAST TECHNIQUE: Multidetector CT images of the thoracic were obtained using the standard protocol without intravenous contrast. RADIATION DOSE REDUCTION: This exam was performed according to the departmental dose-optimization program which includes automated exposure control, adjustment of the mA and/or  kV according to patient size and/or use of iterative reconstruction technique. COMPARISON:  None Available. FINDINGS: Alignment: Mild exaggerated thoracic kyphosis.  No listhesis. Vertebrae: No acute fracture or focal pathologic process. Paraspinal and other soft tissues: Negative. Disc levels: Less than typical degenerative changes in the thoracic spine with only mild midthoracic disc space narrowing and endplate spurring. Negative facets. No visible impingement IMPRESSION: No acute finding. Less than typical thoracic degenerative change for age. Electronically Signed   By: Jorje Guild M.D.   On: 02/27/2022 11:38   CT Lumbar Spine Wo Contrast  Result Date: 02/26/2022 CLINICAL DATA:  Low back pain.  No injury. EXAM: CT LUMBAR SPINE WITHOUT  CONTRAST TECHNIQUE: Multidetector CT imaging of the lumbar spine was performed without intravenous contrast administration. Multiplanar CT image reconstructions were also generated. RADIATION DOSE REDUCTION: This exam was performed according to the departmental dose-optimization program which includes automated exposure control, adjustment of the mA and/or kV according to patient size and/or use of iterative reconstruction technique. COMPARISON:  Lumbar spine x-rays dated January 31, 2021. MRI lumbar spine dated April 19, 2018. FINDINGS: Segmentation: 5 lumbar type vertebrae. Alignment: Unchanged mild levoscoliosis. Unchanged trace anterolisthesis at L4-L5. New trace anterolisthesis at L2-L3. Vertebrae: No acute fracture focal pathologic process. Chronic L3 compression deformity status post cement augmentation. Asymmetric left-sided degenerative endplate sclerosis at G6-Y4. Paraspinal and other soft tissues: Trace right pleural effusion. Mild interlobular septal thickening in the included lung bases. Aortoiliac atherosclerotic vascular disease. Disc levels: Mild disc bulging from L1-L2 through L4-L5. Similar mild spinal canal stenosis at L1-L2 and L2-L3. Lower lumbar facet arthropathy, severe on the left at L5-S1. Multilevel neuroforaminal stenosis, severe on the right at L2-L3 and L3-L4, and moderate bilaterally at L4-L5. This has progressed since 2019. IMPRESSION: 1. No acute osseous abnormality. 2. Chronic L3 compression deformity status post cement augmentation. 3. Multilevel degenerative changes as described above, progressed since 2018. Severe right neuroforaminal stenosis at L2-L3 and L3-L4. 4. Trace right pleural effusion with mild interstitial pulmonary edema in the included lung bases. 5. Aortic Atherosclerosis (ICD10-I70.0). Electronically Signed   By: Titus Dubin M.D.   On: 02/26/2022 15:14   DG CHEST PORT 1 VIEW  Result Date: 02/27/2022 CLINICAL DATA:  CHF.  Back pain. EXAM: PORTABLE CHEST 1 VIEW  COMPARISON:  02/26/2022 FINDINGS: 0704 hours. The cardio pericardial silhouette is enlarged. There is pulmonary vascular congestion without overt pulmonary edema. Interstitial pulmonary edema pattern seen previously has improved in the interval. No substantial pleural effusion. Telemetry leads overlie the chest. IMPRESSION: Interval improvement in interstitial pulmonary edema pattern. Electronically Signed   By: Misty Stanley M.D.   On: 02/27/2022 07:27   DG Chest Port 1 View  Result Date: 02/26/2022 CLINICAL DATA:  Tachycardia. EXAM: PORTABLE CHEST 1 VIEW COMPARISON:  02/29/2020 FINDINGS: Cardiac enlargement. Pulmonary vascular congestion and mild diffuse interstitial edema identified. No sign of pleural effusion or airspace consolidation. Visualized osseous structures appear intact. IMPRESSION: Cardiac enlargement and mild interstitial edema. Electronically Signed   By: Kerby Moors M.D.   On: 02/26/2022 16:20   ECHOCARDIOGRAM COMPLETE  Result Date: 02/27/2022    ECHOCARDIOGRAM REPORT   Patient Name:   NARALY FRITCHER Date of Exam: 02/27/2022 Medical Rec #:  [id number]    Height:       63.0 in Accession #:    [id number]   Weight:       297.5 lb Date of Birth:  1935-12-09     BSA:  2.289 m Patient Age:    51 years     BP:           122/73 mmHg Patient Gender: F            HR:           74 bpm. Exam Location:  Inpatient Procedure: 2D Echo, Cardiac Doppler, Color Doppler and Intracardiac            Opacification Agent Indications:    CHF  History:        Patient has prior history of Echocardiogram examinations, most                 recent 10/08/2019. CHF, CAD, Stroke, Arrythmias:LBBB,                 Signs/Symptoms:Murmur and Syncope; Risk Factors:Dyslipidemia and                 Hypertension. 07/07/2018 cardioversion.  Sonographer:    Luisa Hart RDCS Referring Phys: [id number] Lequita Halt  Sonographer Comments: Technically difficult study due to poor echo windows and patient is morbidly obese. Image  acquisition challenging due to patient body habitus. IMPRESSIONS  1. Left ventricular ejection fraction, by estimation, is 30%. The left ventricle has severely decreased function. The left ventricle demonstrates regional wall motion abnormalities (abnormal septal motion). Left ventricular diastolic parameters are indeterminate.  2. Right ventricular systolic function is normal. The right ventricular size is normal.  3. Left atrial size was mildly dilated.  4. The mitral valve is grossly normal. No evidence of mitral valve regurgitation. The mean mitral valve gradient is 2.0 mmHg.  5. The aortic valve was not well visualized. Aortic valve regurgitation is trivial. No aortic stenosis is present. Comparison(s): Technically difficult study due to difficult PLAX views. Consider Caridac MRI if clinically indicated. FINDINGS  Left Ventricle: Left ventricular ejection fraction, by estimation, is 30%. The left ventricle has severely decreased function. The left ventricle demonstrates regional wall motion abnormalities. Definity contrast agent was given IV to delineate the left  ventricular endocardial borders. The left ventricular internal cavity size was normal in size. Suboptimal image quality limits for assessment of left ventricular hypertrophy. Abnormal (paradoxical) septal motion, consistent with left bundle branch block. Left ventricular diastolic parameters are indeterminate.  LV Wall Scoring: The entire septum is hypokinetic. Right Ventricle: The right ventricular size is normal. No increase in right ventricular wall thickness. Right ventricular systolic function is normal. Left Atrium: Left atrial size was mildly dilated. Right Atrium: Right atrial size was normal in size. Pericardium: Trivial pericardial effusion is present. Presence of epicardial fat layer. Mitral Valve: The mitral valve is grossly normal. No evidence of mitral valve regurgitation. MV peak gradient, 6.0 mmHg. The mean mitral valve gradient is 2.0  mmHg. Tricuspid Valve: The tricuspid valve is normal in structure. Tricuspid valve regurgitation is not demonstrated. Aortic Valve: The aortic valve was not well visualized. Aortic valve regurgitation is trivial. No aortic stenosis is present. Aortic valve mean gradient measures 3.3 mmHg. Aortic valve peak gradient measures 6.8 mmHg. Aortic valve area, by VTI measures 2.50 cm. Pulmonic Valve: The pulmonic valve was not well visualized. Pulmonic valve regurgitation is not visualized. No evidence of pulmonic stenosis. Aorta: The aortic root and ascending aorta are structurally normal, with no evidence of dilitation. IAS/Shunts: No atrial level shunt detected by color flow Doppler.  LEFT VENTRICLE PLAX 2D LVIDd:         4.20 cm LVIDs:  3.80 cm LV PW:         1.50 cm LV IVS:        1.40 cm LVOT diam:     2.10 cm LV SV:         56 LV SV Index:   24 LVOT Area:     3.46 cm  RIGHT VENTRICLE RV Basal diam:  2.60 cm RV Mid diam:    2.10 cm RV S prime:     11.50 cm/s LEFT ATRIUM             Index        RIGHT ATRIUM           Index LA diam:        4.50 cm 1.97 cm/m   RA Area:     20.00 cm LA Vol (A2C):   40.9 ml 17.87 ml/m  RA Volume:   54.00 ml  23.59 ml/m LA Vol (A4C):   96.8 ml 42.29 ml/m LA Biplane Vol: 67.2 ml 29.36 ml/m  AORTIC VALVE                    PULMONIC VALVE AV Area (Vmax):    2.34 cm     PV Vmax:       0.84 m/s AV Area (Vmean):   2.33 cm     PV Peak grad:  2.8 mmHg AV Area (VTI):     2.50 cm AV Vmax:           130.00 cm/s AV Vmean:          86.600 cm/s AV VTI:            0.224 m AV Peak Grad:      6.8 mmHg AV Mean Grad:      3.3 mmHg LVOT Vmax:         87.80 cm/s LVOT Vmean:        58.350 cm/s LVOT VTI:          0.162 m LVOT/AV VTI ratio: 0.72  AORTA Ao Asc diam: 2.90 cm MITRAL VALVE              TRICUSPID VALVE MV Peak grad: 6.0 mmHg    TR Peak grad:   43.6 mmHg MV Mean grad: 2.0 mmHg    TR Vmax:        330.00 cm/s MV Vmax:      1.22 m/s MV Vmean:     67.3 cm/s   SHUNTS MR Peak grad: 80.3  mmHg   Systemic VTI:  0.16 m MR Mean grad: 52.0 mmHg   Systemic Diam: 2.10 cm MR Vmax:      448.00 cm/s MR Vmean:     342.0 cm/s Rudean Haskell MD Electronically signed by Rudean Haskell MD Signature Date/Time: 02/27/2022/11:27:08 AM    Final      Assessment and Plan:   Permanent Atrial Fibrillation  - PTA, patient was taking carvedilol 25 mg BID for rate control and xarelto 20 mg daily for [facility]  - Patient presented to the ER with HR in the 120s, started on diltiazem drip  - Later became bradycardic on diltiazem with HR in the 40s-50s. Diltiazem was discontinued  - Now back on carvedilol at a reduced dose of 6.25 mg BID. Telemetry shows afib with HR in the 90s-110s. I increased carvedilol to 12.5 mg BID for now, will continue to titrate back to home dose as BP tolerates   - Continue Xarelto 20 mg  daily  - Maintain K>4, mag >2 (supplementation per primary)  - I suspect RVR was triggered in part by intractable back pain  Acute on Chronic Combined systolic/diastolic CHF  Acute hypoxic respiratory failure  - Echocardiogram this admission showed EF 30% (down from 55-60% in 09/2019) with regional wall motion abnormalities (abnormal septal motion)  - CXR 5/22 showed mild interstitial edema, CXR 5/23 showed interval improvement in pulmonary edema pattern  -  BNP elevated to 1365, hsTn negative  - Suspect that reduced EF is related to uncontrolled Afib (monitor in 09/2021 showed episodes of RVR).  - Continue losartan 25 mg daily -- plan to transition to entresto this admission when BP can tolerate  - Continue carvedilol 12.5 mg BID-- titrate as BP tolerates  - Patient was started on IV lasix 20 mg BID, later increased to IV 40 mg BID--  produced 1.8 L urine yesterday, currently net -1.3 L since admission  - Continue to have some pedal edema, sob. Continue IV diuresis for now, monitor renal function  - Avoid SGLT2i due to recurrent UTIs   OSA  - Continue CPAP at bedtime   Otherwise per  primary  - Morbid Obesity  - UTI  - Chronic Back pain  - Delirium    Risk Assessment/Risk Scores:   New York Heart Association (NYHA) Functional Class NYHA Class III  CHA2DS2-VASc Score = 5   This indicates a 7.2% annual risk of stroke. The patient's score is based upon: CHF History: 1 HTN History: 1 Diabetes History: 0 Stroke History: 0 Vascular Disease History: 0 Age Score: 2 Gender Score: 1         For questions or updates, please contact [place] Please consult [email or web address] for contact info under    Signed, Margie Billet, PA-C  02/28/2022 12:00 PM  I have examined the patient and reviewed assessment and plan and discussed with patient.  Agree with above as stated.    She appears to have a nonischemic cardiomyopathy related to atrial fibrillation with rapid ventricular response.  No symptoms of ischemia.  Would focus on rate control and heart failure management.  Diuresing well at this point.  If she develops symptoms, could consider ischemic evaluation at a later time.  Prior ischemic evaluations have been negative.  Overall, she is somewhat frail.  She has chronic back pain and requires assistance at home.  Xarelto for stroke prevention.  Tolerating beta-blocker.  Continue to titrate.  Watch for bradycardia.  Watch for bleeding.  She does have a history of GI bleeding in the past.  We will follow along.  Charlotte [name]

## 2022-03-01 ENCOUNTER — Other Ambulatory Visit: Payer: Self-pay | Admitting: Family Medicine

## 2022-03-01 ENCOUNTER — Other Ambulatory Visit (HOSPITAL_COMMUNITY): Payer: Self-pay

## 2022-03-01 DIAGNOSIS — I5042 Chronic combined systolic (congestive) and diastolic (congestive) heart failure: Secondary | ICD-10-CM | POA: Diagnosis not present

## 2022-03-01 DIAGNOSIS — I4891 Unspecified atrial fibrillation: Secondary | ICD-10-CM | POA: Diagnosis not present

## 2022-03-01 DIAGNOSIS — D6869 Other thrombophilia: Secondary | ICD-10-CM

## 2022-03-01 LAB — CBC
HCT: 45 % (ref 36.0–46.0)
Hemoglobin: 14.3 g/dL (ref 12.0–15.0)
MCH: 29.7 pg (ref 26.0–34.0)
MCHC: 31.8 g/dL (ref 30.0–36.0)
MCV: 93.4 fL (ref 80.0–100.0)
Platelets: 223 10*3/uL (ref 150–400)
RBC: 4.82 MIL/uL (ref 3.87–5.11)
RDW: 14.3 % (ref 11.5–15.5)
WBC: 7.6 10*3/uL (ref 4.0–10.5)
nRBC: 0 % (ref 0.0–0.2)

## 2022-03-01 LAB — MAGNESIUM: Magnesium: 2.1 mg/dL (ref 1.7–2.4)

## 2022-03-01 LAB — BASIC METABOLIC PANEL
Anion gap: 8 (ref 5–15)
BUN: 25 mg/dL — ABNORMAL HIGH (ref 8–23)
CO2: 26 mmol/L (ref 22–32)
Calcium: 8.8 mg/dL — ABNORMAL LOW (ref 8.9–10.3)
Chloride: 103 mmol/L (ref 98–111)
Creatinine, Ser: 0.85 mg/dL (ref 0.44–1.00)
GFR, Estimated: >60 mL/min (ref >60)
Glucose, Bld: 116 mg/dL — ABNORMAL HIGH (ref 70–99)
Potassium: 4 mmol/L (ref 3.5–5.1)
Sodium: 137 mmol/L (ref 135–145)

## 2022-03-01 MED ORDER — CARVEDILOL 12.5 MG PO TABS
12.5000 mg | ORAL_TABLET | ORAL | Status: AC
  Administered 2022-03-01: 12.5 mg via ORAL
  Filled 2022-03-01: qty 1

## 2022-03-01 MED ORDER — CARVEDILOL 25 MG PO TABS
25.0000 mg | ORAL_TABLET | Freq: Two times a day (BID) | ORAL | Status: DC
  Administered 2022-03-01: 25 mg via ORAL
  Filled 2022-03-01: qty 1

## 2022-03-01 MED ORDER — MELATONIN 5 MG PO TABS
5.0000 mg | ORAL_TABLET | Freq: Every day | ORAL | Status: DC
  Administered 2022-03-01 – 2022-03-05 (×5): 5 mg via ORAL
  Filled 2022-03-01 (×5): qty 1

## 2022-03-01 MED ORDER — SACUBITRIL-VALSARTAN 24-26 MG PO TABS
1.0000 | ORAL_TABLET | Freq: Two times a day (BID) | ORAL | Status: DC
  Administered 2022-03-02 – 2022-03-06 (×9): 1 via ORAL
  Filled 2022-03-01 (×9): qty 1

## 2022-03-01 MED ORDER — METHOCARBAMOL 500 MG PO TABS
500.0000 mg | ORAL_TABLET | Freq: Two times a day (BID) | ORAL | Status: DC
  Administered 2022-03-01 – 2022-03-06 (×11): 500 mg via ORAL
  Filled 2022-03-01 (×11): qty 1

## 2022-03-01 MED ORDER — ORAL CARE MOUTH RINSE
15.0000 mL | Freq: Two times a day (BID) | OROMUCOSAL | Status: DC
  Administered 2022-03-02 – 2022-03-06 (×8): 15 mL via OROMUCOSAL

## 2022-03-01 NOTE — Progress Notes (Signed)
Heart Failure Stewardship Pharmacist Progress Note   PCP: [name], MD PCP-Cardiologist: Jenkins Rouge, MD    HPI:  86 yo female with PMH significant for Afib (s/p DCCV 2014, 2018, 2019), nonobstructive CAD on LHC in 2011, chronic combined systolic and diastolic HFimpEF, chronic back pain, GERD, HLD, HTN, LBBB, NSVT in 2011, osteoarthritis, pre-DM, OSA on home CPAP, and h/o CVA.   Patient presented to [facility] ED 05/22 with chief complaint of back pain after fall 3 weeks ago, urinary frequency, weakness and dyspnea.  Found to be in Afib with RVR and started on diltiazem gtt which was transitioned to amiodarone after developing bradycardia (HR low 50s).  Volume overload evidenced by 2+ edema and increased breathing effort on exam.  Evidence of mild interstitial pulmonary edema on CT lumbar spine and CXR.  Echo 02/2013 with LVEF 25-30%, mild LVH, moderate AI / MR. Later improved to LVEF 60-65% in 05/2013 with new G1DD.  Reduced slightly in 2015 and 2017 to LVEF 55-60%, down to 45-50% in 06/2018 and back up to 55-60% in 09/2019.  Echo this admission with newly reduced LVEF 30% and normal RV.   Notably, patient found to have UTI and developed delirium - UA with +nitrites, leukocytes, and pyuria (WBC 21-50).  No urinary symptoms outside of frequency were reported by ED provider (had received IV Lasix 1 hour prior).  No urine culture collected.  Patient has history of recurrent UTI's with most recent E. Coli UTI on 04/25 that was treated with Bactrim.  Repeat CXR 05/23 showed improvement in pulmonary edema pattern from 05/22, but patient continued to report SOB and with 2+ edema on exam 05/24.  IV Lasix increased from 20 mg BID > 40 mg BID, spironolactone 12.5 mg daily and carvedilol increased to 12.5 mg BID.   Current HF Medications: Diuretic: furosemide IV 40 mg q12h Beta Blocker: carvedilol 12.5 mg twice daily ACE/ARB/ARNI: losartan 25 mg daily Aldosterone Antagonist: spironolactone 12.5 mg  daily Other: potassium 10 mEq daily  Prior to admission HF Medications: Diuretic: furosemide 20 mg twice daily Beta blocker: carvedilol 25 mg twice daily ACE/ARB/ARNI: losartan 25 mg daily Other: potassium 10 mEq twice daily  Pertinent Lab Values: Serum creatinine 0.85, BUN 25, Potassium 4 (27mEq daily), Sodium 137, BNP 1365.1, Magnesium 2.1, A1c 5.7% (01/2022), Digoxin 0.4 - not on PTA  Vital Signs: Weight: 281 lbs (admission weight: 282.6 lbs) Blood pressure: 130-40s/80-100s Heart rate: labile 70-110s in Afib  I/O: -3.1 L yesterday; net -4.33 L  Medication Assistance / Insurance Benefits Check: Does the patient have prescription insurance?  Yes Type of insurance plan: [facility] Medicare  Outpatient Pharmacy:  Prior to admission outpatient pharmacy[facility] [place] Is the patient willing to use [facility] at discharge? Yes Is the patient willing to transition their outpatient pharmacy to utilize a [facility] outpatient pharmacy?   No - wanted to keep [facility]    Assessment: 1. Acute on chronic combined diastolic and systolic CHF (LVEF [id number]), likely secondary to uncontrolled Afib.  NYHA class II symptoms. - Continue IV furosemide to 40 mg BID - good uop of 3L yesterday, but required 1-3L [place] overnight - Currently taking carvedilol 12.5 mg BID - HR in 100s  - Currently taking losartan 25 mg daily - Continue spironolactone 12.5 mg daily - No SGLT2i with history of recurrent UTI's - Continue potassium 10 mEq daily to maintain K > 4    Plan: 1) Medication changes recommended at this time: - Increase carvedilol to  target dose of 25 mg BID - STOP losartan 25 mg daily. START Entresto 24-26 mg twice daily with elevated BP and stable renal function.   2) Patient assistance: Delene Loll = $47 copay  - Wanted to think about applying for assistance as she didn't know if she wanted to stay on this medication after discharge.   3)  Education  - Patient has been educated on current HF  medications and potential additions to HF medication regimen - Patient verbalizes understanding that over the next few months, these medication doses may change and more medications may be added to optimize HF regimen - Patient has been educated on basic disease state pathophysiology and goals of therapy  Laurey Arrow, PharmD PGY1 Pharmacy Resident 03/01/2022  9:30 AM

## 2022-03-01 NOTE — Progress Notes (Signed)
Heart Failure Navigator Progress Note  Assessed for Heart & Vascular TOC clinic readiness.  Patient does not meet criteria due to patient has a scheduled appointment with cardiology on 03/13/22.per Dr. Gae Bon, BSN, RN Heart Failure Nurse Navigator Secure Chat Only

## 2022-03-01 NOTE — Progress Notes (Signed)
PROGRESS NOTE    Charlotte [name]  [id number] DOB: 20-Dec-1935 DOA: 02/26/2022 PCP: [name], MD  86 y.o. female with medical history significant of PAF on Xarelto, chronic combined HFrEF with recovered LVEF and HFpEF, nonobstructive CAD, HTN, HLD, chronic back pain secondary to degenerative lumbar spine arthritis with distant L3 compression and kyphoplasty, obstructive sleep apnea, morbid obesity.  Patient presented to the ED with worsening back pain, unable to ambulate or get out of bed without assistance, her back pain became so bad over the past 24 hours that EMS was called and needed a Hoyer lift to get her into the gurney to bring to the ED.  She states this pain is radiating both caudally and cranially down the midline as well as somewhat laterally at her mid back and feels sharp and shocking at times with motion/position changes.   -In the ED patient was noted to be tachycardic with A-fib and RVR, also noted to be volume overloaded   Subjective: -Continues to report lower back pain, breathing better, occasional dyspnea reported  Assessment and Plan:  Acute on chronic back pain, h/o fall -T-spine and L-spine CT unremarkable for acute fractures -Multilevel degenerative changes noted and old fractures -PT OT consult completed, not felt to be a CIR candidate, SNF recommended -Continue lidocaine patch, Tylenol -Add Robaxin, avoid sedating meds if possible   A-fib with RVR -Diltiazem drip discontinued in the setting of bradycardia -Continue carvedilol, dose increased further -Continue Xarelto,   Acute on chronic systolic and diastolic CHF Acute hypoxic respiratory failure  -Echo now notes drop in EF to 30% with regional wall motion abnormality, down from 55% in 12/20, pulmonary edema on admission -Diuresing on IV Lasix, she is 4.3 L negative, still appears slightly volume overloaded -Started on Aldactone, Entresto -Appreciate cardiology input, ischemic cardiomyopathy felt  to be less likely, prior ischemic eval were negative  UTI Mild delirium -Started on IV ceftriaxone overnight, unfortunately urine cultures were not sent 48 hours ago, continue ceftriaxone for 3 to 4 days  Chronic obstructive sleep apnea, complex -Continue CPAP   HTN uncontrolled -Continue Coreg/furosemide   Morbid obesity -Lifestyle modification needed   DVT prophylaxis: Xarelto Code Status: DNR Family Communication: called and updated dtr Disposition Plan: To be determined, may need rehab  Consultants:  Cardiology  Procedures:   Antimicrobials:    Objective: Vitals:   02/28/22 2327 03/01/22 0259 03/01/22 0647 03/01/22 0717  BP: 128/83 (!) 135/111  136/85  Pulse: 75 (!) 50 (!) 115 (!) 101  Resp: 17 (!) 23  20  Temp: (!) 97.5 F (36.4 C) 98 F (36.7 C)  97.8 F (36.6 C)  TempSrc: Oral Oral  Oral  SpO2: 100% 100%  95%  Weight:  127.5 kg    Height:        Intake/Output Summary (Last 24 hours) at 03/01/2022 1159 Last data filed at 03/01/2022 0754 Gross per 24 hour  Intake 623 ml  Output 3000 ml  Net -2377 ml   Filed Weights   02/27/22 1145 02/28/22 0255 03/01/22 0259  Weight: 128.2 kg 129.4 kg 127.5 kg    Examination:  General exam: Pleasant elderly female laying in bed, AAO x2, mild cognitive deficits noted HEENT: No JVD CVS: S1-S2, regular rhythm Lungs: Few basilar rales Abdomen: Soft, obese, nontender, bowel sounds present Extremities: 1+ edema Skin: No rashes on exposed skin Psychiatry: Poor insight     Data Reviewed:   CBC: Recent Labs  Lab 02/26/22 1405 02/28/22 0557 03/01/22 0415  WBC 8.5 8.7 7.6  NEUTROABS 6.1  --   --   HGB 13.7 13.9 14.3  HCT 41.8 43.3 45.0  MCV 93.9 93.1 93.4  PLT 192 227 [id number]   Basic Metabolic Panel: Recent Labs  Lab 02/26/22 1405 02/27/22 0309 02/28/22 0557 03/01/22 0415  NA 139 139 140 137  K 4.0 3.3* 4.2 4.0  CL 109 107 105 103  CO2 22 25 26 26   GLUCOSE 120* 110* 126* 116*  BUN 23 18 22  25*   CREATININE 0.92 0.90 0.84 0.85  CALCIUM 8.9 8.3* 8.9 8.8*  MG  --  1.8 2.2 2.1   GFR: Estimated Creatinine Clearance: 64.1 mL/min (by C-G formula based on SCr of 0.85 mg/dL). Liver Function Tests: Recent Labs  Lab 02/26/22 1405  AST 28  ALT 33  ALKPHOS 107  BILITOT 1.1  PROT 6.9  ALBUMIN 3.2*   No results for input(s): LIPASE, AMYLASE in the last 168 hours. No results for input(s): AMMONIA in the last 168 hours. Coagulation Profile: No results for input(s): INR, PROTIME in the last 168 hours. Cardiac Enzymes: No results for input(s): CKTOTAL, CKMB, CKMBINDEX, TROPONINI in the last 168 hours. BNP (last 3 results) No results for input(s): PROBNP in the last 8760 hours. HbA1C: No results for input(s): HGBA1C in the last 72 hours. CBG: No results for input(s): GLUCAP in the last 168 hours. Lipid Profile: No results for input(s): CHOL, HDL, LDLCALC, TRIG, CHOLHDL, LDLDIRECT in the last 72 hours. Thyroid Function Tests: No results for input(s): TSH, T4TOTAL, FREET4, T3FREE, THYROIDAB in the last 72 hours. Anemia Panel: No results for input(s): VITAMINB12, FOLATE, FERRITIN, TIBC, IRON, RETICCTPCT in the last 72 hours. Urine analysis:    Component Value Date/Time   COLORURINE AMBER (A) 02/26/2022 1620   APPEARANCEUR CLOUDY (A) 02/26/2022 1620   LABSPEC 1.028 02/26/2022 1620   PHURINE 5.0 02/26/2022 1620   GLUCOSEU NEGATIVE 02/26/2022 1620   HGBUR NEGATIVE 02/26/2022 1620   BILIRUBINUR NEGATIVE 02/26/2022 1620   BILIRUBINUR negative 11/11/2021 1413   KETONESUR NEGATIVE 02/26/2022 1620   PROTEINUR NEGATIVE 02/26/2022 1620   UROBILINOGEN 1.0 11/11/2021 1413   UROBILINOGEN 0.2 03/23/2008 1050   NITRITE POSITIVE (A) 02/26/2022 1620   LEUKOCYTESUR LARGE (A) 02/26/2022 1620   Sepsis Labs: @LABRCNTIP (procalcitonin:4,lacticidven:4)  )No results found for this or any previous visit (from the past 240 hour(s)).   Radiology Studies: No results found.   Scheduled Meds:   acetaminophen  1,000 mg Oral TID   atorvastatin  40 mg Oral QHS   carvedilol  12.5 mg Oral NOW   carvedilol  25 mg Oral BID WC   DULoxetine  30 mg Oral Daily   ferrous sulfate  325 mg Oral QODAY   furosemide  40 mg Intravenous BID   lidocaine  1 patch Transdermal Q24H   melatonin  5 mg Oral QHS   methocarbamol  500 mg Oral BID   pantoprazole  40 mg Oral Daily   potassium chloride  10 mEq Oral Daily   rivaroxaban  20 mg Oral Q supper   [START ON 03/02/2022] sacubitril-valsartan  1 tablet Oral BID   sodium chloride flush  3 mL Intravenous Q12H   spironolactone  12.5 mg Oral Daily   Continuous Infusions:  sodium chloride     cefTRIAXone (ROCEPHIN)  IV 200 mL/hr at 03/01/22 0754     LOS: 3 days    Time spent: 63min  Domenic Polite, MD [facility]   03/01/2022, 11:59 AM

## 2022-03-01 NOTE — Progress Notes (Addendum)
Progress Note  Patient Name: Charlotte [name] Date of Encounter: 03/01/2022  [facility] Cardiologist: Jenkins Rouge, MD   Subjective   Feels better today.  Not feeling much in the way of palpitations.  Inpatient Medications    Scheduled Meds:  acetaminophen  1,000 mg Oral TID   atorvastatin  40 mg Oral QHS   carvedilol  12.5 mg Oral BID WC   DULoxetine  30 mg Oral Daily   ferrous sulfate  325 mg Oral QODAY   furosemide  40 mg Intravenous BID   lidocaine  1 patch Transdermal Q24H   melatonin  5 mg Oral QHS   methocarbamol  500 mg Oral BID   pantoprazole  40 mg Oral Daily   potassium chloride  10 mEq Oral Daily   rivaroxaban  20 mg Oral Q supper   [START ON 03/02/2022] sacubitril-valsartan  1 tablet Oral BID   sodium chloride flush  3 mL Intravenous Q12H   spironolactone  12.5 mg Oral Daily   Continuous Infusions:  sodium chloride     cefTRIAXone (ROCEPHIN)  IV 200 mL/hr at 03/01/22 0754   PRN Meds: sodium chloride, ondansetron (ZOFRAN) IV, polyvinyl alcohol, sodium chloride flush   Vital Signs    Vitals:   02/28/22 2327 03/01/22 0259 03/01/22 0647 03/01/22 0717  BP: 128/83 (!) 135/111  136/85  Pulse: 75 (!) 50 (!) 115 (!) 101  Resp: 17 (!) 23  20  Temp: (!) 97.5 F (36.4 C) 98 F (36.7 C)  97.8 F (36.6 C)  TempSrc: Oral Oral  Oral  SpO2: 100% 100%  95%  Weight:  127.5 kg    Height:        Intake/Output Summary (Last 24 hours) at 03/01/2022 1151 Last data filed at 03/01/2022 0754 Gross per 24 hour  Intake 623 ml  Output 3000 ml  Net -2377 ml      03/01/2022    2:59 AM 02/28/2022    2:55 AM 02/27/2022   11:45 AM  Last 3 Weights  Weight (lbs) 281 lb 1.4 oz 285 lb 4.4 oz 282 lb 10.1 oz  Weight (kg) 127.5 kg 129.4 kg 128.2 kg      Telemetry    Atrial fibrillation with intermittent rapid ventricular response- Personally Reviewed  ECG      Physical Exam   GEN: No acute distress.  Obese Neck: No JVD Cardiac: Irregularly irregular, tachycardic, no  murmurs, rubs, or gallops.  Respiratory: Clear to auscultation bilaterally. GI: Soft, nontender, non-distended  MS: Bilateral lower extremity edema; No deformity. Neuro:  Nonfocal  Psych: Normal affect   Labs    High Sensitivity Troponin:   Recent Labs  Lab 02/26/22 1405 02/26/22 1742  TROPONINIHS 7 9     Chemistry Recent Labs  Lab 02/26/22 1405 02/27/22 0309 02/28/22 0557 03/01/22 0415  NA 139 139 140 137  K 4.0 3.3* 4.2 4.0  CL 109 107 105 103  CO2 22 25 26 26   GLUCOSE 120* 110* 126* 116*  BUN 23 18 22  25*  CREATININE 0.92 0.90 0.84 0.85  CALCIUM 8.9 8.3* 8.9 8.8*  MG  --  1.8 2.2 2.1  PROT 6.9  --   --   --   ALBUMIN 3.2*  --   --   --   AST 28  --   --   --   ALT 33  --   --   --   ALKPHOS 107  --   --   --  BILITOT 1.1  --   --   --   GFRNONAA >60 >60 >60 >60  ANIONGAP 8 7 9 8     Lipids No results for input(s): CHOL, TRIG, HDL, LABVLDL, LDLCALC, CHOLHDL in the last 168 hours.  Hematology Recent Labs  Lab 02/26/22 1405 02/28/22 0557 03/01/22 0415  WBC 8.5 8.7 7.6  RBC 4.45 4.65 4.82  HGB 13.7 13.9 14.3  HCT 41.8 43.3 45.0  MCV 93.9 93.1 93.4  MCH 30.8 29.9 29.7  MCHC 32.8 32.1 31.8  RDW 14.6 14.3 14.3  PLT 192 227 223   Thyroid No results for input(s): TSH, FREET4 in the last 168 hours.  BNP Recent Labs  Lab 02/26/22 1405  BNP 1,365.1*    DDimer No results for input(s): DDIMER in the last 168 hours.   Radiology    No results found.  Cardiac Studies   EF 30%  Patient Profile     86 y.o. female with systolic dysfunction, likely related to tachycardia induced cardiomyopathy  Assessment & Plan    Atrial fibrillation: Coreg increased yesterday.  Will increase carvedilol to 25 mg twice a day.  Systolic heart failure: Losartan changed to Entresto.  Continue IV Lasix.  Creatinine has been stable.  As long as renal function stays stable, can diurese.  Not using SGLT2 inhibitor as the patient has a history of recurrent UTIs.  No ischemic  symptoms.  Prior ischemic evaluations have been negative.  Would not pursue ischemic testing at this time.  Xarelto for stroke prevention/ acquired thrombophilia.     For questions or updates, please contact [place] Please consult [email or web address] for contact info under        Signed, Larae Grooms, MD  03/01/2022, 11:51 AM

## 2022-03-01 NOTE — Progress Notes (Signed)
Patient remains confused and difficult to redirect; cannot maintain telemetry leads without patient interference. In accordance with the delirium precautions order, will allow patient to sleep and attempt to resume telemetry when able.

## 2022-03-01 NOTE — TOC Benefit Eligibility Note (Signed)
Patient Teacher, English as a foreign language completed.    The patient is currently admitted and upon discharge could be taking Brilinta 90 mg.  The current 30 day co-pay is, $47.00.   The patient is insured through [place], [place] Patient Advocate Specialist [place] Patient Advocate Team Direct Number: [phone]  Fax: [phone]

## 2022-03-01 NOTE — Care Management Important Message (Signed)
Important Message  Patient Details  Name: Charlotte [name] MRN: [id number] Date of Birth: 1936/06/22   Medicare Important Message Given:  Yes     Renie Ora 03/01/2022, 12:32 PM

## 2022-03-01 NOTE — Progress Notes (Addendum)
Physician notified of accelerated heart rate appearing to be possible RVR. Per cardiology note, her RVR may be driven by her pain. Repositioned patient for comfort and provided scheduled 0800 1g tylenol early. Received verbal order to give 0800 12.5mg  coreg as well. Per on-call physician, patient will be assessed by "a colleague".  Heart rate has improved to mid 90s-110s since providing interventions. Update given to patient family during report.

## 2022-03-01 NOTE — Progress Notes (Signed)
Mobility Specialist Progress Note:   03/01/22 1500  Mobility  Activity Ambulated with assistance in room;Transferred from bed to chair  Level of Assistance Minimal assist, patient does 75% or more  Assistive Device Front wheel walker  Distance Ambulated (ft) 6 ft  Activity Response Tolerated well  $Mobility charge 1 Mobility   Pt received in bed needing to have her bed changed. Complaints of back pain. MinA to stand and step to chair. MinA to stand from chair and step to bed. Left in bewd with call bell in reach and all needs met.   [facility] Morna Flud Mobility Specialist

## 2022-03-02 DIAGNOSIS — I5042 Chronic combined systolic (congestive) and diastolic (congestive) heart failure: Secondary | ICD-10-CM | POA: Diagnosis not present

## 2022-03-02 DIAGNOSIS — I4891 Unspecified atrial fibrillation: Secondary | ICD-10-CM | POA: Diagnosis not present

## 2022-03-02 LAB — BASIC METABOLIC PANEL
Anion gap: 8 (ref 5–15)
BUN: 31 mg/dL — ABNORMAL HIGH (ref 8–23)
CO2: 28 mmol/L (ref 22–32)
Calcium: 9 mg/dL (ref 8.9–10.3)
Chloride: 103 mmol/L (ref 98–111)
Creatinine, Ser: 0.95 mg/dL (ref 0.44–1.00)
GFR, Estimated: 59 mL/min — ABNORMAL LOW (ref >60)
Glucose, Bld: 123 mg/dL — ABNORMAL HIGH (ref 70–99)
Potassium: 3.8 mmol/L (ref 3.5–5.1)
Sodium: 139 mmol/L (ref 135–145)

## 2022-03-02 MED ORDER — CARVEDILOL 12.5 MG PO TABS
12.5000 mg | ORAL_TABLET | Freq: Two times a day (BID) | ORAL | Status: DC
  Administered 2022-03-02 – 2022-03-06 (×9): 12.5 mg via ORAL
  Filled 2022-03-02 (×9): qty 1

## 2022-03-02 MED ORDER — SODIUM CHLORIDE 0.9 % IV SOLN
2.0000 g | INTRAVENOUS | Status: AC
  Administered 2022-03-02: 2 g via INTRAVENOUS
  Filled 2022-03-02: qty 20

## 2022-03-02 MED ORDER — FUROSEMIDE 40 MG PO TABS
40.0000 mg | ORAL_TABLET | Freq: Two times a day (BID) | ORAL | Status: DC
  Administered 2022-03-02 – 2022-03-05 (×6): 40 mg via ORAL
  Filled 2022-03-02 (×6): qty 1

## 2022-03-02 NOTE — Consult Note (Signed)
[facility] CM Inpatient Consult   03/02/2022  Charlotte [name] 04/23/1936 [id number]  [facility] [[facility]]  Accountable Care Organization [ACO] Patient: [name]  Primary Care Provider:  Metheney, Catherine D, MD, [facility],  is an embedded provider with a Chronic Care Management team and program, and is listed for the transition of care follow up and appointments.  Patient was screened for Embedded practice service needs for chronic care management.  Met with the patient at the bedside, sitting up in recliner, to check for post hospital follow up Embedded care coordination needs, SDOH reviewed for food insecurity and transportation, no needs assessed. Patient was given a reminder care for post hospital follow up and a 24 hour nurse advise line magnet.  Plan: Continue to follow for needs for Embedded Care Management and made aware of TOC needs for post hospital needs.  Please contact for further questions,   , RN BSN CCM [facility] Hospital Liaison  [phone] business mobile phone Toll free office [phone]  Fax number: [phone] [email or web address]      [facility] CM Inpatient Consult   03/02/2022  Charlotte [name] 04/23/1936 [id number]  [facility] [[facility]]  Accountable Care Organization [ACO] Patient: [name]  Primary Care Provider:  Metheney, Catherine D, MD, [facility],  is an embedded provider with a Chronic Care Management team and program, and is listed for the transition of care follow up and appointments.  Patient was screened for Embedded practice service needs for chronic care management.  Met with the patient at the bedside, sitting up in recliner, to check for post hospital follow up Embedded care coordination needs, SDOH reviewed for food insecurity and transportation, no needs assessed. Patient was given a reminder care for post hospital follow up and a 24 hour nurse advise line magnet.  Plan: Continue to follow for needs for Embedded Care Management and made aware of TOC needs for post hospital needs.  Please contact for further questions,   , RN BSN CCM [facility] Hospital Liaison  [phone] business mobile phone Toll free office [phone]  Fax number: [phone] [email or web address]    

## 2022-03-02 NOTE — Progress Notes (Signed)
Physical Therapy Treatment Patient Details Name: Charlotte [name] MRN: [id number] DOB: 14-Dec-1935 Today's Date: 03/02/2022   History of Present Illness 86 yo female with onset of increasing SOB and heart palpitations was admitted 5/22.  Baseline walker to amb, but has been having increasing back pain and above symptoms. Had a mechanical fall a week before admission.  LE edema, a-fib, desaturating with mobility, lung congestion, UTI. New hypoxia as sleeping with O2 being used for this.  Has worsening of CHF.  PMHx:  PAF, CHF, CAD, HTN, back pain, lumbar and thoracic DJD, atherosclerosis, chronic L3 compression fracture    PT Comments    The pt was seen for continued mobility progression this morning and was able to make good progress in ambulation distance. However, she remains limited by back pain and HR elevation to max 145bpm with exertion and is limited to bouts of 10-12 ft. The pt was able to demo improved ability to complete sit-stand transfers with min-modA to rise but max cues for hand placement and technique. The pt will continue to benefit from skilled PT to progress LE strength, endurance, and dynamic stability, remains highly motivated and eager to pursue acute inpatient rehab.     Recommendations for follow up therapy are one component of a multi-disciplinary discharge planning process, led by the attending physician.  Recommendations may be updated based on patient status, additional functional criteria and insurance authorization.  Follow Up Recommendations  Acute inpatient rehab (3hours/day)     Assistance Recommended at Discharge Frequent or constant Supervision/Assistance  Patient can return home with the following Two people to help with walking and/or transfers;A lot of help with bathing/dressing/bathroom;Assistance with cooking/housework;Direct supervision/assist for medications management;Direct supervision/assist for financial management;Assist for transportation;Help with stairs  or ramp for entrance   Equipment Recommendations  None recommended by PT    Recommendations for Other Services       Precautions / Restrictions Precautions Precautions: Fall;Back Precaution Booklet Issued: No Precaution Comments: back for protection of chronic back fracture, pt with no recall from prior session Restrictions Weight Bearing Restrictions: No     Mobility  Bed Mobility Overal bed mobility: Needs Assistance Bed Mobility: Rolling, Sidelying to Sit Rolling: Min assist Sidelying to sit: Mod assist       General bed mobility comments: minA to initiate roll, modA to complete transfer, HOB slightly elevated    Transfers Overall transfer level: Needs assistance Equipment used: Rolling walker (2 wheels) Transfers: Sit to/from Stand Sit to Stand: Mod assist           General transfer comment: mod to power up to walker    Ambulation/Gait Ambulation/Gait assistance: Min assist Gait Distance (Feet): 12 Feet (+ 12 ft) Assistive device: Rolling walker (2 wheels) Gait Pattern/deviations: Step-to pattern, Decreased stride length, Wide base of support, Trunk flexed Gait velocity: decreased     General Gait Details: pt prefers to stand with bilat forearms resting on RW, able to correct with max cues. slow, unsteady gait wtih wide BOS and trunk flexion    Balance Overall balance assessment: Needs assistance, History of Falls Sitting-balance support: Feet supported Sitting balance-Leahy Scale: Fair     Standing balance support: During functional activity, Bilateral upper extremity supported Standing balance-Leahy Scale: Poor Standing balance comment: pt requires walker support to stand                            Cognition Arousal/Alertness: Awake/alert Behavior During Therapy: Flat affect Overall  Cognitive Status: History of cognitive impairments - at baseline                                 General Comments: pt able to express  needs and clal staff appropriately.        Exercises General Exercises - Lower Extremity Long Arc Quad: AROM, Both, 10 reps, Seated (with 3 sec hold in knee extension) Heel Raises: AROM, Both, 10 reps, Seated    General Comments General comments (skin integrity, edema, etc.): SpO2 stable on RA, HR to 145bpm with activity      Pertinent Vitals/Pain Pain Assessment Pain Assessment: Faces Faces Pain Scale: Hurts even more Pain Location: back with standing and walking Pain Descriptors / Indicators: Guarding, Grimacing, Moaning Pain Intervention(s): Limited activity within patient's tolerance, Monitored during session, Repositioned     PT Goals (current goals can now be found in the care plan section) Acute Rehab PT Goals Patient Stated Goal: to walk again PT Goal Formulation: With patient Time For Goal Achievement: 03/14/22 Potential to Achieve Goals: Good Progress towards PT goals: Progressing toward goals    Frequency    Min 3X/week      PT Plan Current plan remains appropriate       AM-PAC PT "6 Clicks" Mobility   Outcome Measure  Help needed turning from your back to your side while in a flat bed without using bedrails?: A Lot Help needed moving from lying on your back to sitting on the side of a flat bed without using bedrails?: A Lot Help needed moving to and from a bed to a chair (including a wheelchair)?: A Lot Help needed standing up from a chair using your arms (e.g., wheelchair or bedside chair)?: A Lot Help needed to walk in hospital room?: Total (unable to complete 20 ft) Help needed climbing 3-5 steps with a railing? : Total 6 Click Score: 10    End of Session Equipment Utilized During Treatment: Gait belt Activity Tolerance: Patient limited by fatigue;Patient limited by pain Patient left: in chair;with call bell/phone within reach;with chair alarm set Nurse Communication: Mobility status (need for purewick, pt not calling out when soiled of  urine) PT Visit Diagnosis: Unsteadiness on feet (R26.81);Muscle weakness (generalized) (M62.81);Difficulty in walking, not elsewhere classified (R26.2);Pain Pain - Right/Left:  (back) Pain - part of body:  (back)     Time: 1012-1040 PT Time Calculation (min) (ACUTE ONLY): 28 min  Charges:  $Therapeutic Exercise: 8-22 mins $Therapeutic Activity: 8-22 mins                     Vickki Muff, PT, DPT   Acute Rehabilitation Department Pager #: [phone]   Ronnie Derby 03/02/2022, 11:48 AM

## 2022-03-02 NOTE — Progress Notes (Signed)
Placed pt on Cpap with 2L bleed in. PT tolerating well. No resp distress noted. Will continue to monitor.

## 2022-03-02 NOTE — Progress Notes (Signed)
Progress Note  Patient Name: RADENE LIMONE Date of Encounter: 03/02/2022  [facility] Cardiologist: Jenkins Rouge, MD   Subjective   Feels well  Inpatient Medications    Scheduled Meds:  acetaminophen  1,000 mg Oral TID   atorvastatin  40 mg Oral QHS   carvedilol  12.5 mg Oral BID WC   DULoxetine  30 mg Oral Daily   ferrous sulfate  325 mg Oral QODAY   furosemide  40 mg Intravenous BID   lidocaine  1 patch Transdermal Q24H   mouth rinse  15 mL Mouth Rinse BID   melatonin  5 mg Oral QHS   methocarbamol  500 mg Oral BID   pantoprazole  40 mg Oral Daily   potassium chloride  10 mEq Oral Daily   rivaroxaban  20 mg Oral Q supper   sacubitril-valsartan  1 tablet Oral BID   sodium chloride flush  3 mL Intravenous Q12H   spironolactone  12.5 mg Oral Daily   Continuous Infusions:  sodium chloride     cefTRIAXone (ROCEPHIN)  IV 2 g (03/01/22 2135)   PRN Meds: sodium chloride, ondansetron (ZOFRAN) IV, polyvinyl alcohol, sodium chloride flush   Vital Signs    Vitals:   03/01/22 2341 03/02/22 0103 03/02/22 0436 03/02/22 0845  BP:  121/70 (!) 97/56 118/83  Pulse: 80 89 84   Resp: (!) 23 20 20 18   Temp:  (!) 96.5 F (35.8 C) 97.6 F (36.4 C) 97.7 F (36.5 C)  TempSrc:  Axillary Oral Oral  SpO2: 94% 93% 97% 95%  Weight:  126.3 kg    Height:        Intake/Output Summary (Last 24 hours) at 03/02/2022 1137 Last data filed at 03/02/2022 1100 Gross per 24 hour  Intake 1030 ml  Output 1500 ml  Net -470 ml      03/02/2022    1:03 AM 03/01/2022    2:59 AM 02/28/2022    2:55 AM  Last 3 Weights  Weight (lbs) 278 lb 7.1 oz 281 lb 1.4 oz 285 lb 4.4 oz  Weight (kg) 126.3 kg 127.5 kg 129.4 kg      Telemetry    AFib, intermittent RVR - Personally Reviewed  ECG      Physical Exam   GEN: No acute distress.   Neck: No JVD Cardiac: Irregularly irregular, no murmurs, rubs, or gallops.  Respiratory: Clear to auscultation bilaterally. GI: Soft, nontender, non-distended   MS: No edema; No deformity. Neuro:  Nonfocal  Psych: Normal affect   Labs    High Sensitivity Troponin:   Recent Labs  Lab 02/26/22 1405 02/26/22 1742  TROPONINIHS 7 9     Chemistry Recent Labs  Lab 02/26/22 1405 02/27/22 0309 02/28/22 0557 03/01/22 0415 03/02/22 0408  NA 139 139 140 137 139  K 4.0 3.3* 4.2 4.0 3.8  CL 109 107 105 103 103  CO2 22 25 26 26 28   GLUCOSE 120* 110* 126* 116* 123*  BUN 23 18 22  25* 31*  CREATININE 0.92 0.90 0.84 0.85 0.95  CALCIUM 8.9 8.3* 8.9 8.8* 9.0  MG  --  1.8 2.2 2.1  --   PROT 6.9  --   --   --   --   ALBUMIN 3.2*  --   --   --   --   AST 28  --   --   --   --   ALT 33  --   --   --   --  ALKPHOS 107  --   --   --   --   BILITOT 1.1  --   --   --   --   GFRNONAA >60 >60 >60 >60 59*  ANIONGAP 8 7 9 8 8     Lipids No results for input(s): CHOL, TRIG, HDL, LABVLDL, LDLCALC, CHOLHDL in the last 168 hours.  Hematology Recent Labs  Lab 02/26/22 1405 02/28/22 0557 03/01/22 0415  WBC 8.5 8.7 7.6  RBC 4.45 4.65 4.82  HGB 13.7 13.9 14.3  HCT 41.8 43.3 45.0  MCV 93.9 93.1 93.4  MCH 30.8 29.9 29.7  MCHC 32.8 32.1 31.8  RDW 14.6 14.3 14.3  PLT 192 227 223   Thyroid No results for input(s): TSH, FREET4 in the last 168 hours.  BNP Recent Labs  Lab 02/26/22 1405  BNP 1,365.1*    DDimer No results for input(s): DDIMER in the last 168 hours.   Radiology    No results found.  Cardiac Studies   EF 30%  Patient Profile     86 y.o. female with A-fib and volume overload  Assessment & Plan    Atrial fibrillation: Carvedilol was increased to 25 mg twice a day.  Had a borderline blood pressure this morning so it was decreased back to 12.5 mg twice a day.  Could consider 18.75 mg twice daily if heart rate stays too high.  Just try to keep her heart rate below 100 at rest.  Not looking for resting heart rate close to 60 as she is not terribly active.  Xarelto for stroke prevention.  Acute systolic heart failure: Volume status  appears much improved.  Switch Lasix from IV to p.o., 40 mg p.o. twice daily.  Continue to monitor electrolytes.  Entresto started.  Getting antibiotics for UTI.     For questions or updates, please contact Bailey Please consult [email or web address] for contact info under        Signed, Larae Grooms, MD  03/02/2022, 11:37 AM

## 2022-03-02 NOTE — Progress Notes (Addendum)
PROGRESS NOTE    Charlotte [name]  JSH:[id number] DOB: 06-08-1936 DOA: 02/26/2022 PCP: Agapito Games, MD  86 y.o. female with medical history significant of PAF on Xarelto, chronic combined HFrEF with recovered LVEF and HFpEF, nonobstructive CAD, HTN, HLD, chronic back pain secondary to degenerative lumbar spine arthritis with distant L3 compression and kyphoplasty, obstructive sleep apnea, morbid obesity.  Patient presented to the ED with worsening back pain, unable to ambulate or get out of bed without assistance, her back pain became so bad over the past 24 hours that EMS was called and needed a Hoyer lift to get her into the gurney to bring to the ED.  She states this pain is radiating both caudally and cranially down the midline as well as somewhat laterally at her mid back and feels sharp and shocking at times with motion/position changes.   -In the ED patient was noted to be tachycardic with A-fib and RVR, also noted to be volume overloaded   Subjective: -Continues to report lower back pain, breathing better, occasional dyspnea reported  Assessment and Plan:  Acute on chronic back pain, h/o fall -T-spine and L-spine CT unremarkable for acute fractures -Multilevel degenerative changes noted and old fractures -PT OT consult completed, not felt to be a CIR candidate, SNF recommended-pt is having second thoughts about SNF now, ToC consulted -Continue lidocaine patch, Tylenol -Started on Robaxin, overall improving, avoid sedating meds as tolerated   A-fib with RVR -Diltiazem drip discontinued in the setting of bradycardia -Continue carvedilol, dose increased further -Continue Xarelto,   Acute on chronic systolic and diastolic CHF Acute hypoxic respiratory failure  -Echo now notes drop in EF to 30% with regional wall motion abnormality, down from 55% in 12/20, pulmonary edema on admission -Diuresing on IV Lasix, she is 4.6 L negative, transition to oral Lasix 40 Mg twice  daily -Started on Aldactone, Entresto -Appreciate cardiology input, ischemic cardiomyopathy felt to be less likely, prior ischemic eval were negative  UTI Mild delirium -Started on IV ceftriaxone overnight, unfortunately urine cultures were not sent on admission, discontinue ceftriaxone after today's dose  Chronic obstructive sleep apnea, complex -Continue CPAP   HTN uncontrolled -Continue Coreg/furosemide   Morbid obesity -Lifestyle modification needed   DVT prophylaxis: Xarelto Code Status: DNR Family Communication: called and updated dtr yesterday Disposition Plan: To be determined, may need rehab  Consultants:  Cardiology  Procedures:   Antimicrobials:    Objective: Vitals:   03/01/22 2341 03/02/22 0103 03/02/22 0436 03/02/22 0845  BP:  121/70 (!) 97/56 118/83  Pulse: 80 89 84   Resp: (!) 23 20 20 18   Temp:  (!) 96.5 F (35.8 C) 97.6 F (36.4 C) 97.7 F (36.5 C)  TempSrc:  Axillary Oral Oral  SpO2: 94% 93% 97% 95%  Weight:  126.3 kg    Height:        Intake/Output Summary (Last 24 hours) at 03/02/2022 1143 Last data filed at 03/02/2022 1100 Gross per 24 hour  Intake 1030 ml  Output 1500 ml  Net -470 ml   Filed Weights   02/28/22 0255 03/01/22 0259 03/02/22 0103  Weight: 129.4 kg 127.5 kg 126.3 kg    Examination:  General exam: Pleasant elderly female laying in bed, AAOx3, mild cognitive deficits HEENT: Neck obese unable to assess JVD CVS: S1-S2, regular rhythm Lungs: Few basilar rales Abdomen: Soft, obese, nontender, bowel sounds present Extremities: 1+ edema  Skin: No rashes on exposed skin Psychiatry: Poor insight     Data Reviewed:  CBC: Recent Labs  Lab 02/26/22 1405 02/28/22 0557 03/01/22 0415  WBC 8.5 8.7 7.6  NEUTROABS 6.1  --   --   HGB 13.7 13.9 14.3  HCT 41.8 43.3 45.0  MCV 93.9 93.1 93.4  PLT 192 227 223   Basic Metabolic Panel: Recent Labs  Lab 02/26/22 1405 02/27/22 0309 02/28/22 0557 03/01/22 0415  03/02/22 0408  NA 139 139 140 137 139  K 4.0 3.3* 4.2 4.0 3.8  CL 109 107 105 103 103  CO2 22 25 26 26 28   GLUCOSE 120* 110* 126* 116* 123*  BUN 23 18 22  25* 31*  CREATININE 0.92 0.90 0.84 0.85 0.95  CALCIUM 8.9 8.3* 8.9 8.8* 9.0  MG  --  1.8 2.2 2.1  --    GFR: Estimated Creatinine Clearance: 57.1 mL/min (by C-G formula based on SCr of 0.95 mg/dL). Liver Function Tests: Recent Labs  Lab 02/26/22 1405  AST 28  ALT 33  ALKPHOS 107  BILITOT 1.1  PROT 6.9  ALBUMIN 3.2*   No results for input(s): LIPASE, AMYLASE in the last 168 hours. No results for input(s): AMMONIA in the last 168 hours. Coagulation Profile: No results for input(s): INR, PROTIME in the last 168 hours. Cardiac Enzymes: No results for input(s): CKTOTAL, CKMB, CKMBINDEX, TROPONINI in the last 168 hours. BNP (last 3 results) No results for input(s): PROBNP in the last 8760 hours. HbA1C: No results for input(s): HGBA1C in the last 72 hours. CBG: No results for input(s): GLUCAP in the last 168 hours. Lipid Profile: No results for input(s): CHOL, HDL, LDLCALC, TRIG, CHOLHDL, LDLDIRECT in the last 72 hours. Thyroid Function Tests: No results for input(s): TSH, T4TOTAL, FREET4, T3FREE, THYROIDAB in the last 72 hours. Anemia Panel: No results for input(s): VITAMINB12, FOLATE, FERRITIN, TIBC, IRON, RETICCTPCT in the last 72 hours. Urine analysis:    Component Value Date/Time   COLORURINE AMBER (A) 02/26/2022 1620   APPEARANCEUR CLOUDY (A) 02/26/2022 1620   LABSPEC 1.028 02/26/2022 1620   PHURINE 5.0 02/26/2022 1620   GLUCOSEU NEGATIVE 02/26/2022 1620   HGBUR NEGATIVE 02/26/2022 1620   BILIRUBINUR NEGATIVE 02/26/2022 1620   BILIRUBINUR negative 11/11/2021 1413   KETONESUR NEGATIVE 02/26/2022 1620   PROTEINUR NEGATIVE 02/26/2022 1620   UROBILINOGEN 1.0 11/11/2021 1413   UROBILINOGEN 0.2 03/23/2008 1050   NITRITE POSITIVE (A) 02/26/2022 1620   LEUKOCYTESUR LARGE (A) 02/26/2022 1620   Sepsis  Labs: @LABRCNTIP (procalcitonin:4,lacticidven:4)  )No results found for this or any previous visit (from the past 240 hour(s)).   Radiology Studies: No results found.   Scheduled Meds:  acetaminophen  1,000 mg Oral TID   atorvastatin  40 mg Oral QHS   carvedilol  12.5 mg Oral BID WC   DULoxetine  30 mg Oral Daily   ferrous sulfate  325 mg Oral QODAY   furosemide  40 mg Intravenous BID   lidocaine  1 patch Transdermal Q24H   mouth rinse  15 mL Mouth Rinse BID   melatonin  5 mg Oral QHS   methocarbamol  500 mg Oral BID   pantoprazole  40 mg Oral Daily   potassium chloride  10 mEq Oral Daily   rivaroxaban  20 mg Oral Q supper   sacubitril-valsartan  1 tablet Oral BID   sodium chloride flush  3 mL Intravenous Q12H   spironolactone  12.5 mg Oral Daily   Continuous Infusions:  sodium chloride     cefTRIAXone (ROCEPHIN)  IV 2 g (03/01/22 2135)  LOS: 4 days    Time spent:  Zannie Cove, MD [facility]   03/02/2022, 11:43 AM

## 2022-03-02 NOTE — Telephone Encounter (Signed)
Pt is currently admitted.  Please verify appropriateness of refills.  Tiajuana Amass, CMA

## 2022-03-02 NOTE — Progress Notes (Signed)
Heart Failure Stewardship Pharmacist Progress Note   PCP: Agapito Games, MD PCP-Cardiologist: Charlton Haws, MD    HPI:  86 yo female with PMH significant for Afib (s/p DCCV 2014, 2018, 2019), nonobstructive CAD on LHC in 2011, chronic combined systolic and diastolic HFimpEF, chronic back pain, GERD, HLD, HTN, LBBB, NSVT in 2011, osteoarthritis, pre-DM, OSA on home CPAP, and h/o CVA.   Patient [facility] ED [facility]/22 with chief complaint of back pain after fall 3 weeks ago, urinary frequency, weakness and dyspnea.  Found to be in Afib with RVR and started on diltiazem gtt which was transitioned to amiodarone after developing bradycardia (HR low 50s).  Volume overload evidenced by 2+ edema and increased breathing effort on exam.  Evidence of mild interstitial pulmonary edema on CT lumbar spine and CXR.  Echo 02/2013 with LVEF 25-30%, mild LVH, moderate AI / MR. Later improved to LVEF 60-65% in 05/2013 with new G1DD.  Reduced slightly in 2015 and 2017 to LVEF 55-60%, down to 45-50% in 06/2018 and back up to 55-60% in 09/2019.  Echo this admission with newly reduced LVEF 30% and normal RV.   Notably, patient found to have UTI and developed delirium - UA with +nitrites, leukocytes, and pyuria (WBC 21-50).  No urinary symptoms outside of frequency were reported by ED provider (had received IV Lasix 1 hour prior).  No urine culture collected.  Patient has history of recurrent UTI's with most recent E. Coli UTI on 04/25 that was treated with Bactrim.  Repeat CXR 05/23 showed improvement in pulmonary edema pattern from 05/22, but patient continued to report SOB and with 2+ edema on exam 05/24.  IV Lasix increased from 20 mg BID > 40 mg BID, spironolactone 12.5 mg daily and carvedilol increased to 12.5 mg BID.  Patient remained in Afib 05/25, carvedilol titrated to 25 mg BID but had soft BP and dose reduced to 12.5 mg BID; losartan changed to Entresto.   Current HF Medications: Diuretic: furosemide  IV 40 mg q12h Beta Blocker: carvedilol 12.5 mg twice daily ACE/ARB/ARNI: losartan 25 mg daily Aldosterone Antagonist: spironolactone 12.5 mg daily Other: potassium 10 mEq daily  Prior to admission HF Medications: Diuretic: furosemide 20 mg twice daily Beta blocker: carvedilol 25 mg twice daily ACE/ARB/ARNI: losartan 25 mg daily Other: potassium 10 mEq twice daily  Pertinent Lab Values: Serum creatinine 0.95, BUN 31, Potassium 3.8 ( daily), Sodium 139, BNP 1365.1, Magnesium 2.1, A1c 5.7% (01/2022), Digoxin 0.4 - not on PTA  Vital Signs: Weight: 278 lbs (admission weight: 282.6 lbs) Blood pressure: 100-120/80s Heart rate: 80-90s - Afib with intermittent RVR I/O: -1.65 L yesterday; net -4.59 L  Medication Assistance / Insurance Benefits Check: Does the patient have prescription insurance?  Yes Type of insurance plan: [facility] Medicare  Outpatient Pharmacy:  Prior to admission [facility] Is the patient willing to use [facility] TOC pharmacy at discharge? Yes Is the patient willing to transition their outpatient pharmacy to utilize a [facility] outpatient pharmacy?   No - wanted to keep [facility]    Assessment: 1. Acute on chronic combined diastolic and systolic CHF (LVEF 30%), likely secondary to uncontrolled Afib.  NYHA class II symptoms. - Currently Lasix IV 40 mg BID - uop slowed 3.1 > 1.65L yesterday, down 3 lbs and now on RA - Continue carvedilol 12.5 mg BID - soft BP limiting further titration - Continue Entresto 24-26 mg BID - Continue spironolactone 12.5 mg daily - No SGLT2i with history  of recurrent UTI's - Continue potassium 10 mEq daily to maintain K > 4  Plan: 1) Medication changes recommended at this time: - Agree with transition to Lasix PO 40 mg twice daily  2) Patient assistance: Sherryll Burger = $47 copay  - She wanted to think about applying for assistance as she didn't know if she wanted to stay on this medication after discharge.   3)   Education  - Patient has been educated on current HF medications and potential additions to HF medication regimen - Patient verbalizes understanding that over the next few months, these medication doses may change and more medications may be added to optimize HF regimen - Patient has been educated on basic disease state pathophysiology and goals of therapy  Filbert Schilder, PharmD PGY1 Pharmacy Resident 03/02/2022  8:10 AM

## 2022-03-02 NOTE — Progress Notes (Signed)
Occupational Therapy Treatment Patient Details Name: Charlotte [name] MRN: [id number] DOB: Sep 11, 1936 Today's Date: 03/02/2022   History of present illness 86 yo female with onset of increasing SOB and heart palpitations was admitted 5/22.  Baseline walker to amb, but has been having increasing back pain and above symptoms. Had a mechanical fall a week before admission.  LE edema, a-fib, desaturating with mobility, lung congestion, UTI. New hypoxia as sleeping with O2 being used for this.  Has worsening of CHF.  PMHx:  PAF, CHF, CAD, HTN, back pain, lumbar and thoracic DJD, atherosclerosis, chronic L3 compression fracture   OT comments  Pt progressing toward established OT goals. Pt currently requires modA to stand from recliner and minA for functional mobility at RW level and with standing at sink level to complete grooming. While standing at the sink pt was reliant on forearm support for pain management and stability. HR briefly 130bpm with standing grooming task. Pt will continue to benefit from skilled OT services both acutely and at AIR facility.    Recommendations for follow up therapy are one component of a multi-disciplinary discharge planning process, led by the attending physician.  Recommendations may be updated based on patient status, additional functional criteria and insurance authorization.    Follow Up Recommendations  Acute inpatient rehab (3hours/day)    Assistance Recommended at Discharge Frequent or constant Supervision/Assistance  Patient can return home with the following  A lot of help with walking and/or transfers;A lot of help with bathing/dressing/bathroom;Assistance with cooking/housework;Help with stairs or ramp for entrance;Assist for transportation;Direct supervision/assist for financial management;Direct supervision/assist for medications management   Equipment Recommendations  None recommended by OT    Recommendations for Other Services      Precautions /  Restrictions Precautions Precautions: Fall;Back Precaution Booklet Issued: No Precaution Comments: back for protection of chronic back fracture, pt with no recall from prior session Restrictions Weight Bearing Restrictions: No       Mobility Bed Mobility                    Transfers Overall transfer level: Needs assistance Equipment used: Rolling walker (2 wheels) Transfers: Sit to/from Stand Sit to Stand: Mod assist           General transfer comment: mod to power up to walker     Balance Overall balance assessment: Needs assistance, History of Falls Sitting-balance support: Feet supported Sitting balance-Leahy Scale: Fair     Standing balance support: During functional activity, Bilateral upper extremity supported Standing balance-Leahy Scale: Poor Standing balance comment: requires at least single UE Support in standing                           ADL either performed or assessed with clinical judgement   ADL Overall ADL's : Needs assistance/impaired     Grooming: Min guard;Standing;Wash/dry face;Oral care;Wash/dry hands Grooming Details (indicate cue type and reason): at sink level, attempted to educate pt on importance of standing upright, but pt reports increased comfort with at least support on forearms with grooming task             Lower Body Dressing: Maximal assistance;Sit to/from stand   Toilet Transfer: Moderate assistance;Rolling walker (2 wheels) Toilet Transfer Details (indicate cue type and reason): for short distance ambulation to bathroom, modA to boost into standing Toileting- Clothing Manipulation and Hygiene: Minimal assistance;Sit to/from stand       Functional mobility during ADLs: Minimal assistance;Rolling walker (  2 wheels) General ADL Comments: pt limited by decreased activity tolerance, pain and HR;HR up to 130bpm with standing at sink during grooming ADL    Extremity/Trunk Assessment Upper Extremity  Assessment Upper Extremity Assessment: Generalized weakness   Lower Extremity Assessment Lower Extremity Assessment: Defer to PT evaluation        Vision       Perception     Praxis      Cognition Arousal/Alertness: Awake/alert Behavior During Therapy: Flat affect Overall Cognitive Status: History of cognitive impairments - at baseline                                          Exercises      Shoulder Instructions       General Comments HR up to 130bpm with standing to complete groomming, SpO2 >95% on RA    Pertinent Vitals/ Pain       Pain Assessment Pain Assessment: 0-10 Pain Score: 7  Pain Location: back with standing and walking Pain Descriptors / Indicators: Guarding, Grimacing, Moaning Pain Intervention(s): Limited activity within patient's tolerance, Monitored during session  Home Living                                          Prior Functioning/Environment              Frequency  Min 2X/week        Progress Toward Goals  OT Goals(current goals can now be found in the care plan section)  Progress towards OT goals: Progressing toward goals  Acute Rehab OT Goals OT Goal Formulation: With patient Time For Goal Achievement: 03/14/22 Potential to Achieve Goals: Good ADL Goals Pt Will Perform Grooming: with supervision;standing Pt Will Perform Upper Body Dressing: with set-up;sitting Pt Will Perform Lower Body Dressing: with min guard assist;sit to/from stand Pt Will Transfer to Toilet: with min assist;ambulating;regular height toilet  Plan Discharge plan remains appropriate    Co-evaluation                 AM-PAC OT "6 Clicks" Daily Activity     Outcome Measure   Help from another person eating meals?: None Help from another person taking care of personal grooming?: None Help from another person toileting, which includes using toliet, bedpan, or urinal?: A Lot Help from another person bathing  (including washing, rinsing, drying)?: A Lot Help from another person to put on and taking off regular upper body clothing?: A Little Help from another person to put on and taking off regular lower body clothing?: A Lot 6 Click Score: 17    End of Session Equipment Utilized During Treatment: Rolling walker (2 wheels)  OT Visit Diagnosis: Unsteadiness on feet (R26.81);Muscle weakness (generalized) (M62.81);History of falling (Z91.81);Other symptoms and signs involving cognitive function   Activity Tolerance Patient tolerated treatment well;Patient limited by pain   Patient Left in chair;with call bell/phone within reach;with chair alarm set   Nurse Communication Mobility status        Time: [id number] OT Time Calculation (min): 18 min  Charges: OT General Charges $OT Visit: 1 Visit OT Treatments $Self Care/Home Management : 8-22 mins  Rosey Bath OTR/L Acute Rehabilitation Services Office: [phone]   Rebeca Alert 03/02/2022, 3:16 PM

## 2022-03-03 LAB — BASIC METABOLIC PANEL
Anion gap: 7 (ref 5–15)
BUN: 34 mg/dL — ABNORMAL HIGH (ref 8–23)
CO2: 27 mmol/L (ref 22–32)
Calcium: 8.9 mg/dL (ref 8.9–10.3)
Chloride: 105 mmol/L (ref 98–111)
Creatinine, Ser: 1.04 mg/dL — ABNORMAL HIGH (ref 0.44–1.00)
GFR, Estimated: 53 mL/min — ABNORMAL LOW (ref >60)
Glucose, Bld: 124 mg/dL — ABNORMAL HIGH (ref 70–99)
Potassium: 3.5 mmol/L (ref 3.5–5.1)
Sodium: 139 mmol/L (ref 135–145)

## 2022-03-03 NOTE — TOC Progression Note (Addendum)
Transition of Care [facility]) - Progression Note    Patient Details  Name: Charlotte [name] MRN: [id number] Date of Birth: 05-28-1936  Transition of Care [facility]) CM/SW Contact  Lawerance Sabal, RN Phone Number: 03/03/2022, 11:29 AM  Clinical Narrative:   [name] w patient at bedside. Discussed HH recommendations, encouraged HH, however patient declined.   Attempted to call daughter to discuss, no answer, unidentified VM [name], [name] Daughter [phone]      Expected Discharge Plan: Home w Home Health Services Barriers to Discharge: Continued Medical Work up  Expected Discharge Plan and Services Expected Discharge Plan: Home w Home Health Services In-house Referral: NA Discharge Planning Services: CM Consult   Living arrangements for the past 2 months: Single Family Home                   DME Agency: NA       HH Arranged: NA           Social Determinants of Health (SDOH) Interventions    Readmission Risk Interventions     View : No data to display.

## 2022-03-03 NOTE — Progress Notes (Signed)
Physical Therapy Treatment Patient Details Name: Charlotte [name] MRN: [id number] DOB: Jan 29, 1936 Today's Date: 03/03/2022   History of Present Illness 86 yo female with onset of increasing SOB and heart palpitations was admitted 5/22.  Baseline walker to amb, but has been having increasing back pain and above symptoms. Had a mechanical fall a week before admission.  LE edema, a-fib, desaturating with mobility, lung congestion, UTI. New hypoxia as sleeping with O2 being used for this.  Has worsening of CHF.  PMHx:  PAF, CHF, CAD, HTN, back pain, lumbar and thoracic DJD, atherosclerosis, chronic L3 compression fracture    PT Comments    Pt making slow, steady progress with mobility. Pt has lift chairs at home to make getting to standing easier. Pt is not interested in SNF or even in HHPT. She has an aide and her daughter to assist at home.    Recommendations for follow up therapy are one component of a multi-disciplinary discharge planning process, led by the attending physician.  Recommendations may be updated based on patient status, additional functional criteria and insurance authorization.  Follow Up Recommendations  Home health PT (pt refusing SNF and HH)     Assistance Recommended at Discharge Frequent or constant Supervision/Assistance  Patient can return home with the following Assistance with cooking/housework;Direct supervision/assist for medications management;Direct supervision/assist for financial management;Assist for transportation;Help with stairs or ramp for entrance;A little help with walking and/or transfers;A little help with bathing/dressing/bathroom   Equipment Recommendations  None recommended by PT    Recommendations for Other Services       Precautions / Restrictions Precautions Precautions: Fall;Back Precaution Booklet Issued: No Precaution Comments: back for protection of chronic back fracture Restrictions Weight Bearing Restrictions: No     Mobility  Bed  Mobility               General bed mobility comments: Pt up in chair    Transfers Overall transfer level: Needs assistance Equipment used: Rolling walker (2 wheels) Transfers: Sit to/from Stand Sit to Stand: Min guard           General transfer comment: Incr time and effort but no physical asssist    Ambulation/Gait Ambulation/Gait assistance: Min guard Gait Distance (Feet): 20 Feet Assistive device: Rolling walker (2 wheels) Gait Pattern/deviations: Decreased stride length, Wide base of support, Trunk flexed, Step-through pattern Gait velocity: decreased Gait velocity interpretation: <1.31 ft/sec, indicative of household ambulator   General Gait Details: Remained upright with hands on walker and not forearms. Assist for safety   Stairs             Wheelchair Mobility    Modified Rankin (Stroke Patients Only)       Balance Overall balance assessment: Needs assistance, History of Falls Sitting-balance support: Feet supported Sitting balance-Leahy Scale: Fair     Standing balance support: During functional activity, Bilateral upper extremity supported Standing balance-Leahy Scale: Poor Standing balance comment: walker and min guard                            Cognition Arousal/Alertness: Awake/alert Behavior During Therapy: Flat affect Overall Cognitive Status: History of cognitive impairments - at baseline                                          Exercises      General Comments General  comments (skin integrity, edema, etc.): HR to 140 with amb      Pertinent Vitals/Pain Pain Assessment Pain Assessment: Faces Faces Pain Scale: Hurts even more Pain Location: back with standing and walking Pain Descriptors / Indicators: Guarding, Grimacing, Moaning Pain Intervention(s): Limited activity within patient's tolerance    Home Living                          Prior Function            PT Goals  (current goals can now be found in the care plan section) Acute Rehab PT Goals Patient Stated Goal: to walk again Progress towards PT goals: Progressing toward goals    Frequency    Min 3X/week      PT Plan Discharge plan needs to be updated    Co-evaluation              AM-PAC PT "6 Clicks" Mobility   Outcome Measure  Help needed turning from your back to your side while in a flat bed without using bedrails?: A Lot Help needed moving from lying on your back to sitting on the side of a flat bed without using bedrails?: A Lot Help needed moving to and from a bed to a chair (including a wheelchair)?: A Little Help needed standing up from a chair using your arms (e.g., wheelchair or bedside chair)?: A Little Help needed to walk in hospital room?: A Little Help needed climbing 3-5 steps with a railing? : Total 6 Click Score: 14    End of Session Equipment Utilized During Treatment: Gait belt Activity Tolerance: Patient limited by fatigue Patient left: in chair;with call bell/phone within reach;with chair alarm set Nurse Communication: Mobility status (need for purewick, pt not calling out when soiled of urine) PT Visit Diagnosis: Unsteadiness on feet (R26.81);Muscle weakness (generalized) (M62.81);Difficulty in walking, not elsewhere classified (R26.2);Pain Pain - part of body:  (back)     Time: [id number] PT Time Calculation (min) (ACUTE ONLY): 16 min  Charges:  $Gait Training: 8-22 mins                     [place] [facility] [place] 03/03/2022, 5:36 PM

## 2022-03-03 NOTE — Progress Notes (Signed)
Progress Note  Patient Name: Charlotte [name] Date of Encounter: 03/03/2022  [facility] Cardiologist: Jenkins Rouge, MD   Subjective   No acute events, feeling like swelling is getting better. She   Inpatient Medications    Scheduled Meds:  acetaminophen  1,000 mg Oral TID   atorvastatin  40 mg Oral QHS   carvedilol  12.5 mg Oral BID WC   DULoxetine  30 mg Oral Daily   ferrous sulfate  325 mg Oral QODAY   furosemide  40 mg Oral BID   lidocaine  1 patch Transdermal Q24H   mouth rinse  15 mL Mouth Rinse BID   melatonin  5 mg Oral QHS   methocarbamol  500 mg Oral BID   pantoprazole  40 mg Oral Daily   potassium chloride  10 mEq Oral Daily   rivaroxaban  20 mg Oral Q supper   sacubitril-valsartan  1 tablet Oral BID   sodium chloride flush  3 mL Intravenous Q12H   spironolactone  12.5 mg Oral Daily   Continuous Infusions:  sodium chloride     PRN Meds: sodium chloride, ondansetron (ZOFRAN) IV, polyvinyl alcohol, sodium chloride flush   Vital Signs    Vitals:   03/03/22 0035 03/03/22 0512 03/03/22 0513 03/03/22 0745  BP: 119/72  127/79 104/82  Pulse: 79  (!) 54 85  Resp: 17  15 17   Temp:   97.8 F (36.6 C) (!) 97.5 F (36.4 C)  TempSrc:   Axillary Oral  SpO2: 95%  95% 92%  Weight:  125.9 kg    Height:        Intake/Output Summary (Last 24 hours) at 03/03/2022 1221 Last data filed at 03/03/2022 [id number] Gross per 24 hour  Intake 240 ml  Output 700 ml  Net -460 ml      03/03/2022    5:12 AM 03/02/2022    1:03 AM 03/01/2022    2:59 AM  Last 3 Weights  Weight (lbs) 277 lb 9 oz 278 lb 7.1 oz 281 lb 1.4 oz  Weight (kg) 125.9 kg 126.3 kg 127.5 kg      Telemetry    Atrial fibrillation - Personally Reviewed  ECG    No new since 5/22 - Personally Reviewed  Physical Exam   GEN: No acute distress.   Neck: No JVD Cardiac: irregularly irregular, no murmurs, rubs, or gallops.  Respiratory: Clear to auscultation bilaterally. GI: Soft, nontender, non-distended   MS: bilateral trace to 1+ LE edema; No deformity. Neuro:  Nonfocal  Psych: Normal affect   Labs    High Sensitivity Troponin:   Recent Labs  Lab 02/26/22 1405 02/26/22 1742  TROPONINIHS 7 9     Chemistry Recent Labs  Lab 02/26/22 1405 02/27/22 0309 02/28/22 0557 03/01/22 0415 03/02/22 0408 03/03/22 0434  NA 139 139 140 137 139 139  K 4.0 3.3* 4.2 4.0 3.8 3.5  CL 109 107 105 103 103 105  CO2 22 25 26 26 28 27   GLUCOSE 120* 110* 126* 116* 123* 124*  BUN 23 18 22  25* 31* 34*  CREATININE 0.92 0.90 0.84 0.85 0.95 1.04*  CALCIUM 8.9 8.3* 8.9 8.8* 9.0 8.9  MG  --  1.8 2.2 2.1  --   --   PROT 6.9  --   --   --   --   --   ALBUMIN 3.2*  --   --   --   --   --   AST 28  --   --   --   --   --  ALT 33  --   --   --   --   --   ALKPHOS 107  --   --   --   --   --   BILITOT 1.1  --   --   --   --   --   GFRNONAA >60 >60 >60 >60 59* 53*  ANIONGAP 8 7 9 8 8 7     Lipids No results for input(s): CHOL, TRIG, HDL, LABVLDL, LDLCALC, CHOLHDL in the last 168 hours.  Hematology Recent Labs  Lab 02/26/22 1405 02/28/22 0557 03/01/22 0415  WBC 8.5 8.7 7.6  RBC 4.45 4.65 4.82  HGB 13.7 13.9 14.3  HCT 41.8 43.3 45.0  MCV 93.9 93.1 93.4  MCH 30.8 29.9 29.7  MCHC 32.8 32.1 31.8  RDW 14.6 14.3 14.3  PLT 192 227 223   Thyroid No results for input(s): TSH, FREET4 in the last 168 hours.  BNP Recent Labs  Lab 02/26/22 1405  BNP 1,365.1*    DDimer No results for input(s): DDIMER in the last 168 hours.   Radiology    No results found.  Cardiac Studies   Echo 02/27/22 1. Left ventricular ejection fraction, by estimation, is 30%. The left  ventricle has severely decreased function. The left ventricle demonstrates  regional wall motion abnormalities (abnormal septal motion). Left  ventricular diastolic parameters are  indeterminate.   2. Right ventricular systolic function is normal. The right ventricular  size is normal.   3. Left atrial size was mildly dilated.   4. The  mitral valve is grossly normal. No evidence of mitral valve  regurgitation. The mean mitral valve gradient is 2.0 mmHg.   5. The aortic valve was not well visualized. Aortic valve regurgitation  is trivial. No aortic stenosis is present.   Comparison(s): Technically difficult study due to difficult PLAX views.  Consider Caridac MRI if clinically indicated.   Patient Profile     86 y.o. female with history of atrial fibrillation on rivaroxaban, chronic combined systolic and diastolic heart failure who is being seen for the same at the request of Dr. Broadus John.  Assessment & Plan    Atrial fibrillation -CHA2DS2/VAS Stroke Risk Points=7  -continue rivaroxaban -continue carvedilol 12.5 mg BID -avoid diltiazem given reduced EF and prior bradycardia with this -aiming for HR <100 at rest  Acute on chronic systolic and diastolic heart failure -EF newly reduced to 30% this admission, has history of recovered EF in the past -admission weight 128.2 kg, current weight 125.9 kg, net negative ~5L -carvedilol as above -started on entresto 24-26 mg BID this admission, no BP room to uptitrate -tolerating furosemide oral 40 mg BID -tolerating spironolactone 12.5 mg BID -no SGLT2i as she is being treated for UTI    [facility] will sign off.   Medication Recommendations: discharge on current inpatient cardiac meds No change: atorvastatin 40 mg, rivaroxaban 20 mg daily Changed: carvedilol decreased to 12.5 mg BID, furosemide increased to 40 mg BID. Losartan stopped, entresto started, spironolactone started Other recommendations (labs, testing, etc):  none Follow up as an outpatient:  Has outpatient follow up with Dr. Stanford Breed on 03/13/22  For questions or updates, please contact [facility] Please consult [email or web address] for contact info under        Signed, Buford Dresser, MD  03/03/2022, 12:21 PM

## 2022-03-03 NOTE — Progress Notes (Signed)
PROGRESS NOTE    Charlotte [name]  [id number] DOB: 1936/01/15 DOA: 02/26/2022 PCP: [name], MD  86 y.o. female with medical history significant of PAF on Xarelto, chronic combined HFrEF with recovered LVEF and HFpEF, nonobstructive CAD, HTN, HLD, chronic back pain secondary to degenerative lumbar spine arthritis with distant L3 compression and kyphoplasty, obstructive sleep apnea, morbid obesity.  Patient presented to the ED with worsening back pain, unable to ambulate or get out of bed without assistance, her back pain became so bad over the past 24 hours that EMS was called and needed a Hoyer lift to get her into the gurney to bring to the ED.  She states this pain is radiating both caudally and cranially down the midline as well as somewhat laterally at her mid back and feels sharp and shocking at times with motion/position changes.   -In the ED patient was noted to be tachycardic with A-fib and RVR, also noted to be volume overloaded   Subjective: -Continues to report lower back pain, breathing better, occasional dyspnea reported  Assessment and Plan:  Acute on chronic back pain, h/o fall -T-spine and L-spine CT unremarkable for acute fractures -Multilevel degenerative changes noted and old fractures -PT OT consult completed, ? CIR candidate, SNF recommended-pt is wanting to go home instead -Improving on current regimen of Robaxin, scheduled Tylenol and lidocaine patch -Continue to increase mobility as tolerated, encouraged her to consider short-term rehab   A-fib with RVR -Diltiazem drip discontinued in the setting of bradycardia -Continue carvedilol, dose increased further -Continue Xarelto,   Acute on chronic systolic and diastolic CHF Acute hypoxic respiratory failure  -Echo now notes drop in EF to 30% with regional wall motion abnormality, down from 55% in 12/20, pulmonary edema on admission -Diuresing on IV Lasix, she is 5.5 L negative, weight down 8 lbs,  transitioned to oral Lasix, kidney function is stable -Started on Aldactone, Entresto -Appreciate cardiology input, ischemic cardiomyopathy felt to be less likely, prior ischemic eval were negative -Discharge planning, will need cards follow-up  UTI Mild delirium -Started on IV ceftriaxone overnight, unfortunately urine cultures were not sent on admission, completed 4 days of ceftriaxone yesterday  Chronic obstructive sleep apnea, complex -Continue CPAP   HTN  -Continue Coreg/furosemide   Morbid obesity -Lifestyle modification needed   DVT prophylaxis: Xarelto Code Status: DNR Family Communication: called and updated dtr 5/25 Disposition Plan: To be determined  Consultants:  Cardiology  Procedures:   Antimicrobials:    Objective: Vitals:   03/03/22 0035 03/03/22 0512 03/03/22 0513 03/03/22 0745  BP: 119/72  127/79 104/82  Pulse: 79  (!) 54 85  Resp: 17  15 17   Temp:   97.8 F (36.6 C) (!) 97.5 F (36.4 C)  TempSrc:   Axillary Oral  SpO2: 95%  95% 92%  Weight:  125.9 kg    Height:        Intake/Output Summary (Last 24 hours) at 03/03/2022 0817 Last data filed at 03/03/2022 [id number] Gross per 24 hour  Intake 480 ml  Output 1150 ml  Net -670 ml   Filed Weights   03/01/22 0259 03/02/22 0103 03/03/22 0512  Weight: 127.5 kg 126.3 kg 125.9 kg    Examination:  General exam: Obese pleasant elderly female laying in bed, AAOx3, mild cognitive deficits HEENT: Neck obese unable to assess JVD CVS: S1-S2, regular rhythm Lungs: Decreased breath sounds at bases Abdomen: Soft, obese, nontender, bowel sounds present Extremities: Trace edema Skin: No rashes on exposed skin Psychiatry:  Poor insight     Data Reviewed:   CBC: Recent Labs  Lab 02/26/22 1405 02/28/22 0557 03/01/22 0415  WBC 8.5 8.7 7.6  NEUTROABS 6.1  --   --   HGB 13.7 13.9 14.3  HCT 41.8 43.3 45.0  MCV 93.9 93.1 93.4  PLT 192 227 [id number]   Basic Metabolic Panel: Recent Labs  Lab 02/27/22 0309  02/28/22 0557 03/01/22 0415 03/02/22 0408 03/03/22 0434  NA 139 140 137 139 139  K 3.3* 4.2 4.0 3.8 3.5  CL 107 105 103 103 105  CO2 25 26 26 28 27   GLUCOSE 110* 126* 116* 123* 124*  BUN 18 22 25* 31* 34*  CREATININE 0.90 0.84 0.85 0.95 1.04*  CALCIUM 8.3* 8.9 8.8* 9.0 8.9  MG 1.8 2.2 2.1  --   --    GFR: Estimated Creatinine Clearance: 52 mL/min (A) (by C-G formula based on SCr of 1.04 mg/dL (H)). Liver Function Tests: Recent Labs  Lab 02/26/22 1405  AST 28  ALT 33  ALKPHOS 107  BILITOT 1.1  PROT 6.9  ALBUMIN 3.2*   No results for input(s): LIPASE, AMYLASE in the last 168 hours. No results for input(s): AMMONIA in the last 168 hours. Coagulation Profile: No results for input(s): INR, PROTIME in the last 168 hours. Cardiac Enzymes: No results for input(s): CKTOTAL, CKMB, CKMBINDEX, TROPONINI in the last 168 hours. BNP (last 3 results) No results for input(s): PROBNP in the last 8760 hours. HbA1C: No results for input(s): HGBA1C in the last 72 hours. CBG: No results for input(s): GLUCAP in the last 168 hours. Lipid Profile: No results for input(s): CHOL, HDL, LDLCALC, TRIG, CHOLHDL, LDLDIRECT in the last 72 hours. Thyroid Function Tests: No results for input(s): TSH, T4TOTAL, FREET4, T3FREE, THYROIDAB in the last 72 hours. Anemia Panel: No results for input(s): VITAMINB12, FOLATE, FERRITIN, TIBC, IRON, RETICCTPCT in the last 72 hours. Urine analysis:    Component Value Date/Time   COLORURINE AMBER (A) 02/26/2022 1620   APPEARANCEUR CLOUDY (A) 02/26/2022 1620   LABSPEC 1.028 02/26/2022 1620   PHURINE 5.0 02/26/2022 1620   GLUCOSEU NEGATIVE 02/26/2022 1620   HGBUR NEGATIVE 02/26/2022 1620   BILIRUBINUR NEGATIVE 02/26/2022 1620   BILIRUBINUR negative 11/11/2021 1413   KETONESUR NEGATIVE 02/26/2022 1620   PROTEINUR NEGATIVE 02/26/2022 1620   UROBILINOGEN 1.0 11/11/2021 1413   UROBILINOGEN 0.2 03/23/2008 1050   NITRITE POSITIVE (A) 02/26/2022 1620    LEUKOCYTESUR LARGE (A) 02/26/2022 1620   Sepsis Labs: @LABRCNTIP (procalcitonin:4,lacticidven:4)  )No results found for this or any previous visit (from the past 240 hour(s)).   Radiology Studies: No results found.   Scheduled Meds:  acetaminophen  1,000 mg Oral TID   atorvastatin  40 mg Oral QHS   carvedilol  12.5 mg Oral BID WC   DULoxetine  30 mg Oral Daily   ferrous sulfate  325 mg Oral QODAY   furosemide  40 mg Oral BID   lidocaine  1 patch Transdermal Q24H   mouth rinse  15 mL Mouth Rinse BID   melatonin  5 mg Oral QHS   methocarbamol  500 mg Oral BID   pantoprazole  40 mg Oral Daily   potassium chloride  10 mEq Oral Daily   rivaroxaban  20 mg Oral Q supper   sacubitril-valsartan  1 tablet Oral BID   sodium chloride flush  3 mL Intravenous Q12H   spironolactone  12.5 mg Oral Daily   Continuous Infusions:  sodium chloride  LOS: 5 days    Time spent: 46min  Domenic Polite, MD [facility]   03/03/2022, 8:17 AM

## 2022-03-04 DIAGNOSIS — I480 Paroxysmal atrial fibrillation: Secondary | ICD-10-CM

## 2022-03-04 LAB — BASIC METABOLIC PANEL
Anion gap: 10 (ref 5–15)
BUN: 34 mg/dL — ABNORMAL HIGH (ref 8–23)
CO2: 27 mmol/L (ref 22–32)
Calcium: 9 mg/dL (ref 8.9–10.3)
Chloride: 104 mmol/L (ref 98–111)
Creatinine, Ser: 0.99 mg/dL (ref 0.44–1.00)
GFR, Estimated: 56 mL/min — ABNORMAL LOW (ref >60)
Glucose, Bld: 115 mg/dL — ABNORMAL HIGH (ref 70–99)
Potassium: 3.7 mmol/L (ref 3.5–5.1)
Sodium: 141 mmol/L (ref 135–145)

## 2022-03-04 MED ORDER — POTASSIUM CHLORIDE CRYS ER 20 MEQ PO TBCR
40.0000 meq | EXTENDED_RELEASE_TABLET | Freq: Every day | ORAL | Status: DC
  Administered 2022-03-04 – 2022-03-06 (×3): 40 meq via ORAL
  Filled 2022-03-04 (×3): qty 2

## 2022-03-04 NOTE — NC FL2 (Signed)
[place] MEDICAID FL2 LEVEL OF CARE SCREENING TOOL     IDENTIFICATION  Patient Name: Charlotte [name] Birthdate: 22-Oct-1935 Sex: female Admission Date (Current Location): 02/26/2022  [facility] and [place] Number:  Herbalist and Address:  [facility], [place] [address], [place], [place] [address]      Provider Number: [id number]  Attending Physician Name and Address:  Domenic Polite, MD  Relative Name and Phone Number:  Butch Penny [phone]    Current Level of Care: Hospital Recommended Level of Care: [place] Prior Approval Number:    Date Approved/Denied:   PASRR Number: [id number]  Discharge Plan: SNF    Current Diagnoses: Patient Active Problem List   Diagnosis Date Noted   CHF (congestive heart failure) ([place]) 02/26/2022   Encounter for counseling on adaptive servo-ventilation (ASV) use 02/06/2022   Memory impairment 11/14/2021   Yeast infection of the skin 10/31/2021   Macular degeneration 06/07/2021   Sleep-related hypoxia 07/09/2020   Severe sleep apnea 07/09/2020   Complex sleep apnea syndrome 06/23/2020   Iron deficiency anemia due to chronic blood loss 06/16/2020   Sleeps in sitting position due to orthopnea 05/12/2020   Chronic combined systolic and diastolic congestive heart failure ([place]) 05/12/2020   Morbid obesity ([place]) 05/12/2020   Excessive daytime sleepiness 05/12/2020   Nocturia 01/07/2020   Lacunar stroke ([place]) 09/28/2019   Stasis, venous 04/13/2019   NICM (nonischemic cardiomyopathy) ([place]) 07/12/2017   Low back pain without sciatica 06/05/2017   Compression fracture of L3 lumbar vertebra 06/02/2017   Low back pain 06/01/2017   Falls 06/01/2017   Intractable back pain 06/01/2017   Arteriosclerosis of abdominal aorta (HCC) 05/30/2017   Lumbosacral strain 05/29/2017   Symptomatic anemia 05/21/2017   Hyponatremia 05/21/2017   Gastrointestinal hemorrhage    Chronic constipation 05/10/2015   Atrial  fibrillation ([place]) 11/10/2014   Severe obesity (BMI >= 40) ([place]) 06/18/2014   Spinal stenosis of lumbar region without neurogenic claudication 03/30/2014   Restless legs syndrome (RLS) 03/30/2014   Idiopathic progressive polyneuropathy 03/30/2014   Aortic regurgitation 03/03/2014   Diabetes mellitus type 2 in obese ([place]) 12/09/2013   Long term current use of anticoagulant therapy 05/14/2013   VITAMIN D DEFICIENCY 10/31/2010   VENTRICULAR TACHYCARDIA 05/31/2010   DYSPNEA 04/24/2010   CATARACTS 04/20/2010   Edema 02/28/2010   ELECTROCARDIOGRAM, ABNORMAL 09/19/2007   Osteoarthritis of both ankles 01/01/2007   Hyperlipidemia 08/13/2006   Essential hypertension 07/16/2006   RHINITIS, ALLERGIC 07/16/2006   GASTROESOPHAGEAL REFLUX, NO ESOPHAGITIS 07/16/2006   IRRITABLE BOWEL SYNDROME 07/16/2006   OSTEOPENIA 07/16/2006    Orientation RESPIRATION BLADDER Height & Weight     Self, Time, Situation  Normal Incontinent, External catheter Weight: 276 lb 0.3 oz (125.2 kg) Height:  5' 2.99" (160 cm)  BEHAVIORAL SYMPTOMS/MOOD NEUROLOGICAL BOWEL NUTRITION STATUS      Continent Diet (See DC summary)  AMBULATORY STATUS COMMUNICATION OF NEEDS Skin   Limited Assist Verbally Other (Comment) (erythema redness, arm)                       Personal Care Assistance Level of Assistance  Bathing, Feeding, Dressing Bathing Assistance: Limited assistance Feeding assistance: Independent Dressing Assistance: Limited assistance     Functional Limitations Info  Sight, Hearing, Speech Sight Info: Impaired Hearing Info: Adequate Speech Info: Adequate    SPECIAL CARE FACTORS FREQUENCY  PT (By licensed PT), OT (By licensed OT)     PT Frequency:  5x per week OT Frequency: 5x per week            Contractures Contractures Info: Not present    Additional Factors Info  Code Status, Allergies Code Status Info: DNR Allergies Info: Metformin And Related, Amlodipine           Current  Medications (03/04/2022):  This is the current hospital active medication list Current Facility-Administered Medications  Medication Dose Route Frequency Provider Last Rate Last Admin   0.9 %  sodium chloride infusion  250 mL Intravenous PRN Wynetta Fines T, MD       acetaminophen (TYLENOL) tablet 1,000 mg  1,000 mg Oral TID Wynetta Fines T, MD   1,000 mg at 03/04/22 1329   atorvastatin (LIPITOR) tablet 40 mg  40 mg Oral QHS Wynetta Fines T, MD   40 mg at 03/03/22 2100   carvedilol (COREG) tablet 12.5 mg  12.5 mg Oral BID WC Reino Bellis B, NP   12.5 mg at 03/04/22 0918   DULoxetine (CYMBALTA) DR capsule 30 mg  30 mg Oral Daily Wynetta Fines T, MD   30 mg at 03/04/22 [id number]   ferrous sulfate tablet 325 mg  325 mg Oral Tamsen Meek T, MD   325 mg at 03/03/22 0827   furosemide (LASIX) tablet 40 mg  40 mg Oral BID Jettie Booze, MD   40 mg at 03/04/22 0918   lidocaine (LIDODERM) 5 % 1 patch  1 patch Transdermal Q24H Wynetta Fines T, MD   1 patch at 03/03/22 1700   MEDLINE mouth rinse  15 mL Mouth Rinse BID Domenic Polite, MD   15 mL at 03/04/22 1000   melatonin tablet 5 mg  5 mg Oral QHS Domenic Polite, MD   5 mg at 03/03/22 2100   methocarbamol (ROBAXIN) tablet 500 mg  500 mg Oral BID Domenic Polite, MD   500 mg at 03/04/22 0918   ondansetron (ZOFRAN) injection 4 mg  4 mg Intravenous Q6H PRN Wynetta Fines T, MD       pantoprazole (PROTONIX) EC tablet 40 mg  40 mg Oral Daily Wynetta Fines T, MD   40 mg at 03/04/22 [id number]   polyvinyl alcohol (LIQUIFILM TEARS) 1.4 % ophthalmic solution 1 drop  1 drop Both Eyes PRN Wynetta Fines T, MD       potassium chloride SA (KLOR-CON M) CR tablet 40 mEq  40 mEq Oral Daily Domenic Polite, MD   40 mEq at 03/04/22 1328   rivaroxaban (XARELTO) tablet 20 mg  20 mg Oral Q supper Wynetta Fines T, MD   20 mg at 03/03/22 1658   sacubitril-valsartan (ENTRESTO) 24-26 mg per tablet  1 tablet Oral BID Jettie Booze, MD   1 tablet at 03/04/22 0918   sodium chloride flush  (NS) 0.9 % injection 3 mL  3 mL Intravenous Q12H Wynetta Fines T, MD   3 mL at 03/04/22 1000   sodium chloride flush (NS) 0.9 % injection 3 mL  3 mL Intravenous PRN Wynetta Fines T, MD       spironolactone (ALDACTONE) tablet 12.5 mg  12.5 mg Oral Daily Domenic Polite, MD   12.5 mg at 03/04/22 [id number]     Discharge Medications: Please see discharge summary for a list of discharge medications.  Relevant Imaging Results:  Relevant Lab Results:   Additional Information SSN# [id number]  Bary Castilla, LCSW

## 2022-03-04 NOTE — Progress Notes (Signed)
Mobility Specialist: Progress Note   03/04/22 0955  Mobility  Activity Ambulated with assistance in room  Level of Assistance Minimal assist, patient does 75% or more  Assistive Device Front wheel walker  Distance Ambulated (ft) 20 ft  Activity Response Tolerated well  $Mobility charge 1 Mobility   Pre-Mobility: 106 HR During Mobility: 121 HR Post-Mobility: 103 HR  Pt received in the bed and agreeable to ambulation. Distance limited secondary to back and bilateral ankle pain she rated 7/10. To the recliner after session with call bell and phone in reach.   [facility] Yohance Hathorne Mobility Specialist Mobility Specialist 5 North: [phone] Mobility Specialist 6 North: [phone]

## 2022-03-04 NOTE — Progress Notes (Signed)
PROGRESS NOTE    Charlotte [name]  [id number] DOB: 18-Sep-1936 DOA: 02/26/2022 PCP: [name], MD  86 y.o. female with medical history significant of PAF on Xarelto, chronic combined HFrEF with recovered LVEF and HFpEF, nonobstructive CAD, HTN, HLD, chronic back pain secondary to degenerative lumbar spine arthritis with distant L3 compression and kyphoplasty, obstructive sleep apnea, morbid obesity.  Patient presented to the ED with worsening back pain, unable to ambulate or get out of bed without assistance, her back pain became so bad over the past 24 hours that EMS was called and needed a Hoyer lift to get her into the gurney to bring to the ED.  She states this pain is radiating both caudally and cranially down the midline as well as somewhat laterally at her mid back and feels sharp and shocking at times with motion/position changes.   -In the ED patient was noted to be tachycardic with A-fib and RVR, also noted to be volume overloaded -Improving on diuretics and rate control meds, echo with drop in EF to 30%   Subjective: -Feels okay overall, breathing better, some lower back discomfort with activity  Assessment and Plan:  Acute on chronic back pain, h/o fall -T-spine and L-spine CT unremarkable for acute fractures -Multilevel degenerative changes noted and old fractures -PT OT consult completed, ? CIR candidate, SNF recommended-pt is wanting to go home instead -Improving on current regimen of Robaxin, scheduled Tylenol and lidocaine patch -Continue to increase mobility as tolerated, patient declines short-term rehab and home health PT, her family wants her to consider short-term rehab and will be coming by and talking to her about this today -Discharge planning, home tomorrow with [facility] if she continues to decline rehab   A-fib with RVR -Diltiazem drip discontinued in the setting of bradycardia -Continue carvedilol, dose increased further -Continue Xarelto,   Acute on  chronic systolic and diastolic CHF Acute hypoxic respiratory failure  -Echo now notes drop in EF to 30% with regional wall motion abnormality, down from 55% in 12/20, pulmonary edema on admission -Diuresing on IV Lasix, she is 5.5 L negative, weight down 8 lbs, transitioned to oral Lasix, kidney function is stable -Started on Aldactone, Entresto -Appreciate cardiology input, ischemic cardiomyopathy felt to be less likely, prior ischemic eval were negative -Remains stable, has a cardiology follow-up on 6/6  UTI Mild delirium -Started on IV ceftriaxone overnight, unfortunately urine cultures were not sent on admission, completed 4 days of ceftriaxone  Chronic obstructive sleep apnea, complex -Continue CPAP   HTN  -Continue Coreg/furosemide   Morbid obesity -Lifestyle modification needed   DVT prophylaxis: Xarelto Code Status: DNR Family Communication: called and updated dtr today Disposition Plan: To be determined, family coming in to discuss rehab with the patient this morning  Consultants:  Cardiology  Procedures:   Antimicrobials:    Objective: Vitals:   03/03/22 2327 03/04/22 0128 03/04/22 0545 03/04/22 0822  BP:  119/86 126/83 (!) 115/92  Pulse:  78 89 90  Resp: 19 19 19 17   Temp:  (!) 97.5 F (36.4 C) 97.8 F (36.6 C) 97.9 F (36.6 C)  TempSrc:  Oral Oral Oral  SpO2:  98% 96% 96%  Weight:  125.9 kg 125.2 kg   Height:        Intake/Output Summary (Last 24 hours) at 03/04/2022 0942 Last data filed at 03/04/2022 [id number] Gross per 24 hour  Intake 720 ml  Output 825 ml  Net -105 ml   Filed Weights   03/03/22 0512  03/04/22 0128 03/04/22 0545  Weight: 125.9 kg 125.9 kg 125.2 kg    Examination:  General exam: Obese pleasant elderly female sitting up in bed, AAOx3, mild cognitive deficits HEENT: Neck obese unable to assess JVD CVS: S1-S2, regular rhythm Lungs: Decreased breath sounds the bases otherwise clear Abdomen: Soft, obese, nontender, bowel sounds  present Extremities: Trace edema only Skin: No rashes on exposed skin Psychiatry: Poor insight     Data Reviewed:   CBC: Recent Labs  Lab 02/26/22 1405 02/28/22 0557 03/01/22 0415  WBC 8.5 8.7 7.6  NEUTROABS 6.1  --   --   HGB 13.7 13.9 14.3  HCT 41.8 43.3 45.0  MCV 93.9 93.1 93.4  PLT 192 227 [id number]   Basic Metabolic Panel: Recent Labs  Lab 02/27/22 0309 02/28/22 0557 03/01/22 0415 03/02/22 0408 03/03/22 0434 03/04/22 0312  NA 139 140 137 139 139 141  K 3.3* 4.2 4.0 3.8 3.5 3.7  CL 107 105 103 103 105 104  CO2 25 26 26 28 27 27   GLUCOSE 110* 126* 116* 123* 124* 115*  BUN 18 22 25* 31* 34* 34*  CREATININE 0.90 0.84 0.85 0.95 1.04* 0.99  CALCIUM 8.3* 8.9 8.8* 9.0 8.9 9.0  MG 1.8 2.2 2.1  --   --   --    GFR: Estimated Creatinine Clearance: 54.4 mL/min (by C-G formula based on SCr of 0.99 mg/dL). Liver Function Tests: Recent Labs  Lab 02/26/22 1405  AST 28  ALT 33  ALKPHOS 107  BILITOT 1.1  PROT 6.9  ALBUMIN 3.2*   No results for input(s): LIPASE, AMYLASE in the last 168 hours. No results for input(s): AMMONIA in the last 168 hours. Coagulation Profile: No results for input(s): INR, PROTIME in the last 168 hours. Cardiac Enzymes: No results for input(s): CKTOTAL, CKMB, CKMBINDEX, TROPONINI in the last 168 hours. BNP (last 3 results) No results for input(s): PROBNP in the last 8760 hours. HbA1C: No results for input(s): HGBA1C in the last 72 hours. CBG: No results for input(s): GLUCAP in the last 168 hours. Lipid Profile: No results for input(s): CHOL, HDL, LDLCALC, TRIG, CHOLHDL, LDLDIRECT in the last 72 hours. Thyroid Function Tests: No results for input(s): TSH, T4TOTAL, FREET4, T3FREE, THYROIDAB in the last 72 hours. Anemia Panel: No results for input(s): VITAMINB12, FOLATE, FERRITIN, TIBC, IRON, RETICCTPCT in the last 72 hours. Urine analysis:    Component Value Date/Time   COLORURINE AMBER (A) 02/26/2022 1620   APPEARANCEUR CLOUDY (A)  02/26/2022 1620   LABSPEC 1.028 02/26/2022 1620   PHURINE 5.0 02/26/2022 1620   GLUCOSEU NEGATIVE 02/26/2022 1620   HGBUR NEGATIVE 02/26/2022 1620   BILIRUBINUR NEGATIVE 02/26/2022 1620   BILIRUBINUR negative 11/11/2021 1413   KETONESUR NEGATIVE 02/26/2022 1620   PROTEINUR NEGATIVE 02/26/2022 1620   UROBILINOGEN 1.0 11/11/2021 1413   UROBILINOGEN 0.2 03/23/2008 1050   NITRITE POSITIVE (A) 02/26/2022 1620   LEUKOCYTESUR LARGE (A) 02/26/2022 1620   Sepsis Labs: @LABRCNTIP (procalcitonin:4,lacticidven:4)  )No results found for this or any previous visit (from the past 240 hour(s)).   Radiology Studies: No results found.   Scheduled Meds:  acetaminophen  1,000 mg Oral TID   atorvastatin  40 mg Oral QHS   carvedilol  12.5 mg Oral BID WC   DULoxetine  30 mg Oral Daily   ferrous sulfate  325 mg Oral QODAY   furosemide  40 mg Oral BID   lidocaine  1 patch Transdermal Q24H   mouth rinse  15 mL Mouth Rinse BID  melatonin  5 mg Oral QHS   methocarbamol  500 mg Oral BID   pantoprazole  40 mg Oral Daily   potassium chloride  10 mEq Oral Daily   rivaroxaban  20 mg Oral Q supper   sacubitril-valsartan  1 tablet Oral BID   sodium chloride flush  3 mL Intravenous Q12H   spironolactone  12.5 mg Oral Daily   Continuous Infusions:  sodium chloride       LOS: 6 days    Time spent: 46min  Domenic Polite, MD [facility]   03/04/2022, 9:42 AM

## 2022-03-04 NOTE — TOC Transition Note (Signed)
Transition of Care [facility]) - CM/SW Discharge Note   Patient Details  Name: MAIANA HENNIGAN MRN: [id number] Date of Birth: 09-04-1936  [facility]) CM/SW Contact:  Lawerance Sabal, RN Phone Number: 03/04/2022, 2:27 PM   Clinical Narrative:    [name] w patient and her daughter [name] (via speaker from the patent's phone) at bedside. Lengthy discussion regarding disposition plans and safety of patient going back home alone. Patient is agreeable to SNF, depending on the facility. Preference for [facility] and [facility] agreeable to send out to [employer]. CSW requested to workup for SNF. Ordered bari 3/1 to be shipped to the house per daughter's request. Will also leave pur wick information at bedside for daughter who plans to visit tomorrow around 10am       Barriers to Discharge: Continued Medical Work up   Patient Goals and CMS Choice Patient states their goals for this hospitalization and ongoing recovery are:: return home   Choice offered to / list presented to : NA  Discharge Placement                       Discharge Plan and Services In-house Referral: NA Discharge Planning Services: CM Consult              DME Agency: NA       HH Arranged: NA          Social Determinants of Health (SDOH) Interventions     Readmission Risk Interventions     View : No data to display.

## 2022-03-04 NOTE — TOC Progression Note (Signed)
Transition of Care [facility]) - Progression Note    Patient Details  Name: Charlotte [name] MRN: [id number] Date of Birth: Apr 09, 1936  [facility]) CM/SW Contact  Patrice Paradise, LCSW Phone Number: 03/04/2022, 4:14 PM  Clinical Narrative:     Per RN CM pt is now agreeable to SNF. Pt prefers [facility] or [facility]. SNF w/u completed.  TOC team will continue to assist with discharge planning needs.  Expected Discharge Plan: Skilled Nursing Facility (Pt now agreeable to SNF.) Barriers to Discharge: Continued Medical Work up  Expected Discharge Plan and Services Expected Discharge Plan: Skilled Nursing Facility (Pt now agreeable to SNF.) In-house Referral: NA Discharge Planning Services: CM Consult   Living arrangements for the past 2 months: Single Family Home                   DME Agency: NA       HH Arranged: NA           Social Determinants of Health (SDOH) Interventions    Readmission Risk Interventions     View : No data to display.

## 2022-03-05 LAB — BASIC METABOLIC PANEL
Anion gap: 7 (ref 5–15)
BUN: 34 mg/dL — ABNORMAL HIGH (ref 8–23)
CO2: 27 mmol/L (ref 22–32)
Calcium: 8.9 mg/dL (ref 8.9–10.3)
Chloride: 103 mmol/L (ref 98–111)
Creatinine, Ser: 1.2 mg/dL — ABNORMAL HIGH (ref 0.44–1.00)
GFR, Estimated: 45 mL/min — ABNORMAL LOW (ref >60)
Glucose, Bld: 123 mg/dL — ABNORMAL HIGH (ref 70–99)
Potassium: 4.1 mmol/L (ref 3.5–5.1)
Sodium: 137 mmol/L (ref 135–145)

## 2022-03-05 MED ORDER — FUROSEMIDE 40 MG PO TABS
40.0000 mg | ORAL_TABLET | Freq: Every day | ORAL | Status: DC
  Administered 2022-03-06: 40 mg via ORAL
  Filled 2022-03-05: qty 1

## 2022-03-05 NOTE — Progress Notes (Signed)
PROGRESS NOTE    Charlotte [name]  [id number] DOB: 1935/12/02 DOA: 02/26/2022 PCP: [name], MD  86 y.o. female with medical history significant of PAF on Xarelto, chronic combined HFrEF with recovered LVEF and HFpEF, nonobstructive CAD, HTN, HLD, chronic back pain secondary to degenerative lumbar spine arthritis with distant L3 compression and kyphoplasty, obstructive sleep apnea, morbid obesity.  Patient presented to the ED with worsening back pain, unable to ambulate or get out of bed without assistance, her back pain became so bad over the past 24 hours that EMS was called and needed a Hoyer lift to get her into the gurney to bring to the ED.  She states this pain is radiating both caudally and cranially down the midline as well as somewhat laterally at her mid back and feels sharp and shocking at times with motion/position changes.   -In the ED patient was noted to be tachycardic with A-fib and RVR, also noted to be volume overloaded -Improving on diuretics and rate control meds, echo with drop in EF to 30%, cards recommended medical management   Subjective: -Still with occasional back discomfort, ambulating better, denies any dyspnea  Assessment and Plan:  Acute on chronic back pain, h/o fall -T-spine and L-spine CT unremarkable for acute fractures -Multilevel degenerative changes noted and old fractures -PT OT consult completed, plan for SNF -Improving on current regimen of Robaxin, scheduled Tylenol and lidocaine patch -Finally after much back-and-forth is agreeable to short-term rehab now, [facility] consulting, remains medically stable   A-fib with RVR -Diltiazem drip discontinued in the setting of bradycardia -Continue carvedilol, dose increased -Continue Xarelto,   Acute on chronic systolic and diastolic CHF Acute hypoxic respiratory failure  -Echo now notes drop in EF to 30% with regional wall motion abnormality, down from 55% in 12/20, pulmonary edema on  admission -Diuresing on IV Lasix, she is 5.5 L negative, weight down 8 lbs, transitioned to oral Lasix, mild bump in creatinine, will decrease Lasix dose -Started on Aldactone, Entresto -Appreciate cardiology input, ischemic cardiomyopathy felt to be less likely, prior ischemic eval were negative -Remains stable, has a cardiology follow-up on 6/6  UTI Mild delirium -Started on IV ceftriaxone overnight, unfortunately urine cultures were not sent on admission, completed 4 days of ceftriaxone  Chronic obstructive sleep apnea, complex -Continue CPAP   HTN  -Continue Coreg/furosemide   Morbid obesity -Lifestyle modification needed   DVT prophylaxis: Xarelto Code Status: DNR Family Communication: called and updated dtr yesterday Disposition Plan: SNF when bed available  Consultants:  Cardiology  Procedures:   Antimicrobials:    Objective: Vitals:   03/05/22 0247 03/05/22 0534 03/05/22 0711 03/05/22 0915  BP: 113/83 (!) 144/77 136/79 130/78  Pulse:  90 90 84  Resp: 18 16 20    Temp: 98.4 F (36.9 C) 97.9 F (36.6 C) 98 F (36.7 C)   TempSrc: Oral Oral Oral   SpO2: 95% 94% 96% 95%  Weight: 124.9 kg     Height:        Intake/Output Summary (Last 24 hours) at 03/05/2022 0928 Last data filed at 03/05/2022 0816 Gross per 24 hour  Intake 600 ml  Output 1000 ml  Net -400 ml   Filed Weights   03/04/22 0128 03/04/22 0545 03/05/22 0247  Weight: 125.9 kg 125.2 kg 124.9 kg    Examination:  General exam: Obese pleasant female sitting up in bed, AAOx3, no distress, very mild cognitive deficits HEENT: Neck obese unable to assess JVD CVS: S1-S2, regular rhythm Lungs:  Improving air movement, diminished at the bases Abdomen: Soft, obese, nontender, bowel sounds present Extremities: Trace edema  Skin: No rashes on exposed skin Psychiatry: Poor insight     Data Reviewed:   CBC: Recent Labs  Lab 02/26/22 1405 02/28/22 0557 03/01/22 0415  WBC 8.5 8.7 7.6  NEUTROABS  6.1  --   --   HGB 13.7 13.9 14.3  HCT 41.8 43.3 45.0  MCV 93.9 93.1 93.4  PLT 192 227 [id number]   Basic Metabolic Panel: Recent Labs  Lab 02/27/22 0309 02/28/22 0557 03/01/22 0415 03/02/22 0408 03/03/22 0434 03/04/22 0312 03/05/22 0139  NA 139 140 137 139 139 141 137  K 3.3* 4.2 4.0 3.8 3.5 3.7 4.1  CL 107 105 103 103 105 104 103  CO2 25 26 26 28 27 27 27   GLUCOSE 110* 126* 116* 123* 124* 115* 123*  BUN 18 22 25* 31* 34* 34* 34*  CREATININE 0.90 0.84 0.85 0.95 1.04* 0.99 1.20*  CALCIUM 8.3* 8.9 8.8* 9.0 8.9 9.0 8.9  MG 1.8 2.2 2.1  --   --   --   --    GFR: Estimated Creatinine Clearance: 44.8 mL/min (A) (by C-G formula based on SCr of 1.2 mg/dL (H)). Liver Function Tests: Recent Labs  Lab 02/26/22 1405  AST 28  ALT 33  ALKPHOS 107  BILITOT 1.1  PROT 6.9  ALBUMIN 3.2*   No results for input(s): LIPASE, AMYLASE in the last 168 hours. No results for input(s): AMMONIA in the last 168 hours. Coagulation Profile: No results for input(s): INR, PROTIME in the last 168 hours. Cardiac Enzymes: No results for input(s): CKTOTAL, CKMB, CKMBINDEX, TROPONINI in the last 168 hours. BNP (last 3 results) No results for input(s): PROBNP in the last 8760 hours. HbA1C: No results for input(s): HGBA1C in the last 72 hours. CBG: No results for input(s): GLUCAP in the last 168 hours. Lipid Profile: No results for input(s): CHOL, HDL, LDLCALC, TRIG, CHOLHDL, LDLDIRECT in the last 72 hours. Thyroid Function Tests: No results for input(s): TSH, T4TOTAL, FREET4, T3FREE, THYROIDAB in the last 72 hours. Anemia Panel: No results for input(s): VITAMINB12, FOLATE, FERRITIN, TIBC, IRON, RETICCTPCT in the last 72 hours. Urine analysis:    Component Value Date/Time   COLORURINE AMBER (A) 02/26/2022 1620   APPEARANCEUR CLOUDY (A) 02/26/2022 1620   LABSPEC 1.028 02/26/2022 1620   PHURINE 5.0 02/26/2022 1620   GLUCOSEU NEGATIVE 02/26/2022 1620   HGBUR NEGATIVE 02/26/2022 1620   BILIRUBINUR  NEGATIVE 02/26/2022 1620   BILIRUBINUR negative 11/11/2021 1413   KETONESUR NEGATIVE 02/26/2022 1620   PROTEINUR NEGATIVE 02/26/2022 1620   UROBILINOGEN 1.0 11/11/2021 1413   UROBILINOGEN 0.2 03/23/2008 1050   NITRITE POSITIVE (A) 02/26/2022 1620   LEUKOCYTESUR LARGE (A) 02/26/2022 1620   Sepsis Labs: @LABRCNTIP (procalcitonin:4,lacticidven:4)  )No results found for this or any previous visit (from the past 240 hour(s)).   Radiology Studies: No results found.   Scheduled Meds:  acetaminophen  1,000 mg Oral TID   atorvastatin  40 mg Oral QHS   carvedilol  12.5 mg Oral BID WC   DULoxetine  30 mg Oral Daily   ferrous sulfate  325 mg Oral QODAY   furosemide  40 mg Oral BID   lidocaine  1 patch Transdermal Q24H   mouth rinse  15 mL Mouth Rinse BID   melatonin  5 mg Oral QHS   methocarbamol  500 mg Oral BID   pantoprazole  40 mg Oral Daily   potassium chloride  40 mEq Oral Daily   rivaroxaban  20 mg Oral Q supper   sacubitril-valsartan  1 tablet Oral BID   sodium chloride flush  3 mL Intravenous Q12H   spironolactone  12.5 mg Oral Daily   Continuous Infusions:  sodium chloride       LOS: 7 days    Time spent: 21min  Domenic Polite, MD [facility]   03/05/2022, 9:28 AM

## 2022-03-05 NOTE — Progress Notes (Signed)
Mobility Specialist Progress Note:   03/05/22 1420  Mobility  Activity Transferred from chair to bed  Level of Assistance Minimal assist, patient does 75% or more  Assistive Device Front wheel walker  Distance Ambulated (ft) 4 ft  Activity Response Tolerated well  $Mobility charge 1 Mobility   Pt received in chair asking to go back to bed. MinA to stand from chair. Left in bed with call bell in reach and all needs met.   [facility] Jahki Witham Mobility Specialist

## 2022-03-05 NOTE — TOC Progression Note (Addendum)
Transition of Care [facility]) - Progression Note    Patient Details  Name: Charlotte [name] MRN: [id number] Date of Birth: 1936/07/04  Transition of Care [facility]) CM/SW [place], [place] Phone Number: 03/05/2022, 11:39 AM  Clinical Narrative:     Met with pt and pt daughter bedside. Provided bed offers. Pt ambivalent about SNF but daughter encourages her to choose facility. Pt wants more time for choice. CSW will follow up later today.   1110: Daughter called and notified CSW that pt wants [facility]. CSW went to pt room and confirmed with pt. Contacted [facility]; pt can admit tomorrow pending auth. CSW updated daughter and pt.   CSW submitted SNF auth request in navi portal; auth pending.   1320: CSW is notified by [facility] that after their DON reviewed referral, they determined they would not be able to meet pt's needs due to staffing and can no longer offer a bed. CSW notified pt daughter and pt. Both are agreeable for [facility]. CSW notified [place] liaison. CSW attempted to call Navi to change facility on pending SNF auth. Call goes straight to voicemail; CSW left message requesting call back for facility change on the pending auth.   1345: Received call back from [place] and informed facility cannot be changed and that CSW would have to resubmit auth. CSW requested that auth for [facility] be cancelled; navi rep states "I will try and submit a ticket for someone to cancel that auth."  CSW submitted new SNF auth request to navi portal for [facility]; auth pending.   Expected Discharge Plan: Skilled Nursing Facility Barriers to Discharge: Insurance Authorization  Expected Discharge Plan and Services Expected Discharge Plan: [place] In-house Referral: NA Discharge Planning Services: CM Consult   Living arrangements for the past 2 months: Single Family Home                   DME Agency: NA       HH Arranged: NA           Social Determinants  of Health (SDOH) Interventions    Readmission Risk Interventions     View : No data to display.

## 2022-03-05 NOTE — Progress Notes (Signed)
Occupational Therapy Treatment Patient Details Name: Charlotte [name] MRN: [id number] DOB: 23-Nov-1935 Today's Date: 03/05/2022   History of present illness 86 yo female with onset of increasing SOB and heart palpitations was admitted 5/22.  Baseline walker to amb, but has been having increasing back pain and above symptoms. Had a mechanical fall a week before admission.  LE edema, a-fib, desaturating with mobility, lung congestion, UTI. New hypoxia as sleeping with O2 being used for this.  Has worsening of CHF.  PMHx:  PAF, CHF, CAD, HTN, back pain, lumbar and thoracic DJD, atherosclerosis, chronic L3 compression fracture   OT comments  Patient had soiled her bed upon arrival and was agreeable to OT assisting if nursing tech would assist for comfort. Patient required assistance from bed pads to get to EOB and stood for peri area and LB bathing while standing with max assist. Patient able to transfer to recliner and performed dressing seated and stood to pull up brief. Patient performed grooming seated due to fatigue from standing with self care. Patient to continue to be followed by acute OT.    Recommendations for follow up therapy are one component of a multi-disciplinary discharge planning process, led by the attending physician.  Recommendations may be updated based on patient status, additional functional criteria and insurance authorization.    Follow Up Recommendations  Acute inpatient rehab (3hours/day)    Assistance Recommended at Discharge Frequent or constant Supervision/Assistance  Patient can return home with the following  A lot of help with bathing/dressing/bathroom;Assistance with cooking/housework;Help with stairs or ramp for entrance;Assist for transportation;Direct supervision/assist for financial management;Direct supervision/assist for medications management;A little help with walking and/or transfers   Equipment Recommendations  None recommended by OT    Recommendations for  Other Services      Precautions / Restrictions Precautions Precautions: Fall;Back Precaution Booklet Issued: No Precaution Comments: back for protection of chronic back fracture Restrictions Weight Bearing Restrictions: No       Mobility Bed Mobility Overal bed mobility: Needs Assistance Bed Mobility: Rolling, Sidelying to Sit Rolling: Min assist Sidelying to sit: Mod assist       General bed mobility comments: bed pads used to assist with getting to EOB    Transfers Overall transfer level: Needs assistance Equipment used: Rolling walker (2 wheels) Transfers: Sit to/from Stand, Bed to chair/wheelchair/BSC Sit to Stand: Min guard     Step pivot transfers: Min assist     General transfer comment: min assist for safety due to standing for self care     Balance Overall balance assessment: Needs assistance, History of Falls Sitting-balance support: Feet supported Sitting balance-[name] Scale: Fair     Standing balance support: During functional activity, Bilateral upper extremity supported Standing balance-[name] Scale: Poor Standing balance comment: dependent on UE support                           ADL either performed or assessed with clinical judgement   ADL Overall ADL's : Needs assistance/impaired     Grooming: Wash/dry hands;Wash/dry face;Oral care;Set up;Sitting Grooming Details (indicate cue type and reason): performed seated due to fatigue from standing for self care     Lower Body Bathing: Maximal assistance;Sit to/from stand Lower Body Bathing Details (indicate cue type and reason): patient stood from EOB to RW with min assist from raised bed and max assist to clean following BM in bed. Upper Body Dressing : Minimal assistance;Sitting Upper Body Dressing Details (indicate cue type  and reason): donned gown Lower Body Dressing: Moderate assistance;Maximal assistance;Sit to/from stand Lower Body Dressing Details (indicate cue type and reason):  max assist to doff and donn socks and mod assist to donn pull up brief with patient able to thread legs through brief and assistance to pull up               General ADL Comments: Patient had soiled the bed upon arrival and addressed LB bathing standing from EOB and transferrred to recliner to perform dressing    Extremity/Trunk Assessment              Vision       Perception     Praxis      Cognition Arousal/Alertness: Awake/alert Behavior During Therapy: Flat affect Overall Cognitive Status: History of cognitive impairments - at baseline                                 General Comments: able to follow directions and express needs        Exercises      Shoulder Instructions       General Comments      Pertinent Vitals/ Pain       Pain Assessment Pain Assessment: Faces Faces Pain Scale: Hurts little more Pain Location: back with standing and walking Pain Descriptors / Indicators: Guarding, Grimacing, Moaning Pain Intervention(s): Limited activity within patient's tolerance, Monitored during session, Repositioned  Home Living                                          Prior Functioning/Environment              Frequency  Min 2X/week        Progress Toward Goals  OT Goals(current goals can now be found in the care plan section)  Progress towards OT goals: Progressing toward goals  Acute Rehab OT Goals OT Goal Formulation: With patient Time For Goal Achievement: 03/14/22 Potential to Achieve Goals: Good ADL Goals Pt Will Perform Grooming: with supervision;standing Pt Will Perform Upper Body Dressing: with set-up;sitting Pt Will Perform Lower Body Dressing: with min guard assist;sit to/from stand Pt Will Transfer to Toilet: with min assist;ambulating;regular height toilet  Plan Discharge plan remains appropriate    Co-evaluation                 AM-PAC OT "6 Clicks" Daily Activity     Outcome  Measure   Help from another person eating meals?: None Help from another person taking care of personal grooming?: None Help from another person toileting, which includes using toliet, bedpan, or urinal?: A Lot Help from another person bathing (including washing, rinsing, drying)?: A Lot Help from another person to put on and taking off regular upper body clothing?: A Little Help from another person to put on and taking off regular lower body clothing?: A Lot 6 Click Score: 17    End of Session Equipment Utilized During Treatment: Rolling walker (2 wheels)  OT Visit Diagnosis: Unsteadiness on feet (R26.81);Muscle weakness (generalized) (M62.81);History of falling (Z91.81);Other symptoms and signs involving cognitive function   Activity Tolerance Patient tolerated treatment well;Patient limited by fatigue   Patient Left in chair;with call bell/phone within reach;with chair alarm set   Nurse Communication Mobility status        Time: [phone]  OT Time Calculation (min): 31 min  Charges: OT General Charges $OT Visit: 1 Visit OT Treatments $Self Care/Home Management : 23-37 mins  Alfonse Flavors, OTA Acute Rehabilitation Services  Pager [phone] Office [phone]   Dewain Penning 03/05/2022, 10:45 AM

## 2022-03-06 ENCOUNTER — Ambulatory Visit: Payer: Medicare Other | Admitting: Neurology

## 2022-03-06 DIAGNOSIS — G8929 Other chronic pain: Secondary | ICD-10-CM | POA: Diagnosis not present

## 2022-03-06 DIAGNOSIS — I480 Paroxysmal atrial fibrillation: Secondary | ICD-10-CM | POA: Diagnosis not present

## 2022-03-06 DIAGNOSIS — W19XXXA Unspecified fall, initial encounter: Secondary | ICD-10-CM | POA: Diagnosis not present

## 2022-03-06 DIAGNOSIS — I5023 Acute on chronic systolic (congestive) heart failure: Secondary | ICD-10-CM | POA: Diagnosis not present

## 2022-03-06 DIAGNOSIS — Z9181 History of falling: Secondary | ICD-10-CM | POA: Diagnosis not present

## 2022-03-06 DIAGNOSIS — M5442 Lumbago with sciatica, left side: Secondary | ICD-10-CM | POA: Diagnosis not present

## 2022-03-06 DIAGNOSIS — Z7401 Bed confinement status: Secondary | ICD-10-CM | POA: Diagnosis not present

## 2022-03-06 DIAGNOSIS — M5441 Lumbago with sciatica, right side: Secondary | ICD-10-CM | POA: Diagnosis not present

## 2022-03-06 DIAGNOSIS — R262 Difficulty in walking, not elsewhere classified: Secondary | ICD-10-CM | POA: Diagnosis not present

## 2022-03-06 DIAGNOSIS — M545 Low back pain, unspecified: Secondary | ICD-10-CM | POA: Diagnosis not present

## 2022-03-06 DIAGNOSIS — R1312 Dysphagia, oropharyngeal phase: Secondary | ICD-10-CM | POA: Diagnosis not present

## 2022-03-06 DIAGNOSIS — M6281 Muscle weakness (generalized): Secondary | ICD-10-CM | POA: Diagnosis not present

## 2022-03-06 DIAGNOSIS — F32A Depression, unspecified: Secondary | ICD-10-CM | POA: Diagnosis not present

## 2022-03-06 DIAGNOSIS — I509 Heart failure, unspecified: Secondary | ICD-10-CM | POA: Diagnosis not present

## 2022-03-06 DIAGNOSIS — S32000A Wedge compression fracture of unspecified lumbar vertebra, initial encounter for closed fracture: Secondary | ICD-10-CM | POA: Diagnosis not present

## 2022-03-06 DIAGNOSIS — I119 Hypertensive heart disease without heart failure: Secondary | ICD-10-CM | POA: Diagnosis not present

## 2022-03-06 DIAGNOSIS — I5042 Chronic combined systolic (congestive) and diastolic (congestive) heart failure: Secondary | ICD-10-CM | POA: Diagnosis not present

## 2022-03-06 DIAGNOSIS — I2510 Atherosclerotic heart disease of native coronary artery without angina pectoris: Secondary | ICD-10-CM | POA: Diagnosis not present

## 2022-03-06 DIAGNOSIS — I10 Essential (primary) hypertension: Secondary | ICD-10-CM | POA: Diagnosis not present

## 2022-03-06 DIAGNOSIS — G4733 Obstructive sleep apnea (adult) (pediatric): Secondary | ICD-10-CM | POA: Diagnosis not present

## 2022-03-06 DIAGNOSIS — R2689 Other abnormalities of gait and mobility: Secondary | ICD-10-CM | POA: Diagnosis not present

## 2022-03-06 DIAGNOSIS — I4821 Permanent atrial fibrillation: Secondary | ICD-10-CM | POA: Diagnosis not present

## 2022-03-06 DIAGNOSIS — Z789 Other specified health status: Secondary | ICD-10-CM | POA: Diagnosis not present

## 2022-03-06 DIAGNOSIS — I4891 Unspecified atrial fibrillation: Secondary | ICD-10-CM | POA: Diagnosis not present

## 2022-03-06 DIAGNOSIS — L304 Erythema intertrigo: Secondary | ICD-10-CM | POA: Diagnosis not present

## 2022-03-06 DIAGNOSIS — E119 Type 2 diabetes mellitus without complications: Secondary | ICD-10-CM | POA: Diagnosis not present

## 2022-03-06 DIAGNOSIS — I420 Dilated cardiomyopathy: Secondary | ICD-10-CM | POA: Diagnosis not present

## 2022-03-06 LAB — BASIC METABOLIC PANEL
Anion gap: 8 (ref 5–15)
BUN: 34 mg/dL — ABNORMAL HIGH (ref 8–23)
CO2: 22 mmol/L (ref 22–32)
Calcium: 8.8 mg/dL — ABNORMAL LOW (ref 8.9–10.3)
Chloride: 105 mmol/L (ref 98–111)
Creatinine, Ser: 0.92 mg/dL (ref 0.44–1.00)
GFR, Estimated: >60 mL/min (ref >60)
Glucose, Bld: 110 mg/dL — ABNORMAL HIGH (ref 70–99)
Potassium: 4.1 mmol/L (ref 3.5–5.1)
Sodium: 135 mmol/L (ref 135–145)

## 2022-03-06 MED ORDER — POTASSIUM CHLORIDE ER 10 MEQ PO TBCR: EXTENDED_RELEASE_TABLET | ORAL | 3 refills | Status: DC

## 2022-03-06 MED ORDER — METHOCARBAMOL 500 MG PO TABS: 500.0000 mg | ORAL_TABLET | Freq: Three times a day (TID) | ORAL | Status: DC | PRN

## 2022-03-06 MED ORDER — SPIRONOLACTONE 25 MG PO TABS: 12.5000 mg | ORAL_TABLET | Freq: Every day | ORAL | Status: DC

## 2022-03-06 MED ORDER — SACUBITRIL-VALSARTAN 24-26 MG PO TABS: 1.0000 | ORAL_TABLET | Freq: Two times a day (BID) | ORAL | 0 refills | Status: DC

## 2022-03-06 MED ORDER — LIDOCAINE 5 % EX PTCH: 1.0000 | MEDICATED_PATCH | CUTANEOUS | 0 refills | Status: DC

## 2022-03-06 MED ORDER — CARVEDILOL 12.5 MG PO TABS: 12.5000 mg | ORAL_TABLET | Freq: Two times a day (BID) | ORAL | Status: DC

## 2022-03-06 MED ORDER — MELATONIN 5 MG PO TABS: 5.0000 mg | ORAL_TABLET | Freq: Every day | ORAL | 0 refills | Status: DC

## 2022-03-06 MED ORDER — FUROSEMIDE 20 MG PO TABS: 40.0000 mg | ORAL_TABLET | Freq: Two times a day (BID) | ORAL | Status: DC

## 2022-03-06 NOTE — Progress Notes (Signed)
WHQ:PRFFMB-WG atrial fibrillation. Catheterization September 2011 showed mild plaque in the LAD at 20 to 30%. Previously had a cardiomyopathy which did improve. Nuclear study August 2019 showed ejection fraction 48% with fixed anteroseptal defect felt secondary to left bundle branch block. No ischemia. Echocardiogram repeated December 2020 and showed normal LV function, mild left ventricular hypertrophy, grade 1 diastolic dysfunction, mild aortic insufficiency. Patient admitted May 2021 with GI bleed and syncope. Initial hemoglobin 6.9.  EGD showed multiple clean-based superficial gastric ulcers. She was transfused 2 units packed red blood cells and treated with IV Protonix.  Xarelto was held. Also had transient renal insufficiency with creatinine of 1.84 which improved prior to discharge.  Monitor December 2022 showed atrial fibrillation rate elevated at times.  Echocardiogram repeated May 2023 and showed ejection fraction 30%, mild left atrial enlargement and trace aortic insufficiency.  Since last seen her dyspnea has improved since being hospitalized.  She denies chest pain, palpitations or syncope.  Her pedal edema has also improved.  She has lost approximately 20 pounds since being admitted.  Current Outpatient Medications  Medication Sig Dispense Refill   acetaminophen (TYLENOL) 500 MG tablet Take 1,000 mg by mouth in the morning and at bedtime.     atorvastatin (LIPITOR) 40 MG tablet TAKE 1 TABLET BY MOUTH  DAILY 100 tablet 1   carvedilol (COREG) 12.5 MG tablet Take 1 tablet (12.5 mg total) by mouth 2 (two) times daily with a meal.     DULoxetine (CYMBALTA) 30 MG capsule TAKE 1 CAPSULE BY MOUTH DAILY 90 capsule 3   Ferrous Sulfate (IRON PO) Take 1 tablet by mouth every other day.     furosemide (LASIX) 20 MG tablet Take 2 tablets (40 mg total) by mouth 2 (two) times daily.     lidocaine (LIDODERM) 5 % Place 1 patch onto the skin daily. Remove & Discard patch within 12 hours or as directed  by MD 30 patch 0   melatonin 5 MG TABS Take 1 tablet (5 mg total) by mouth at bedtime.  0   methocarbamol (ROBAXIN) 500 MG tablet Take 1 tablet (500 mg total) by mouth every 8 (eight) hours as needed for muscle spasms (or back pain).     Multiple Vitamins-Minerals (SYSTANE ICAPS AREDS2) CAPS Take 1 capsule by mouth daily.     nystatin (MYCOSTATIN/NYSTOP) powder Apply 1 application topically 3 (three) times daily. 60 g 3   nystatin cream (MYCOSTATIN) Apply to affected area 2 times daily 60 g 4   omeprazole (PRILOSEC) 40 MG capsule TAKE 1 CAPSULE BY MOUTH  TWICE DAILY IN THE MORNING  AND AT BEDTIME 180 capsule 3   potassium chloride (KLOR-CON) 10 MEQ tablet TAKE 2 TABLET (20 MEQ TOTAL) BY MOUTH 2 (TWO) TIMES DAILY. 180 tablet 3   sacubitril-valsartan (ENTRESTO) 24-26 MG Take 1 tablet by mouth 2 (two) times daily. 60 tablet 0   spironolactone (ALDACTONE) 25 MG tablet Take 0.5 tablets (12.5 mg total) by mouth daily.     XARELTO 20 MG TABS tablet TAKE 1 TABLET BY MOUTH  DAILY WITH SUPPER 100 tablet 1   No current facility-administered medications for this visit.     Past Medical History:  Diagnosis Date   Aortic insufficiency    Atrial fibrillation ([place])    a. s/p DCCV 03/2013   CAD (coronary artery disease)    LHC 06/2010: EF 55%, mild plaque in the LAD 20-30%, otherwise normal coronary arteries   CATARACTS    Chronic  systolic heart failure ([place])    in setting of AFib;  Echocardiogram 02/2013: Moderate LVH, EF 25-30%, anteroseptal and apical HK, moderate AI, MAC, moderate MR, moderate LAE, mild RAE, PASP 32 => after DCCV f/u Echo 8/14: Moderate LVH, EF 60-65%, normal wall motion, grade 1 diastolic dysfunction, moderate AI, moderate LAE   Complication of anesthesia 2009   nausea and vomitting   GERD (gastroesophageal reflux disease)    HYPERLIPIDEMIA, MILD    HYPERTENSION, BENIGN SYSTEMIC    Irritable bowel syndrome    LBBB (left bundle branch block)    MEDIAL EPICONDYLITIS    NSVT  (nonsustained ventricular tachycardia) ([place])    during [place] Echo 2011 => normal cath   OSTEOARTHRITIS    OSTEOPENIA    PONV (postoperative nausea and vomiting)    Pre-diabetes    RHINITIS, ALLERGIC    Stroke ([place])    SYNCOPE    VITAMIN D DEFICIENCY     Past Surgical History:  Procedure Laterality Date   BIOPSY  03/01/2020   Procedure: BIOPSY;  Surgeon: Otis Brace, MD;  Location: [place];  Service: Gastroenterology;;   CARDIOVERSION N/A 04/01/2013   Procedure: CARDIOVERSION;  Surgeon: Josue Hector, MD;  Location: [facility] ENDOSCOPY;  Service: Cardiovascular;  Laterality: N/A;   CARDIOVERSION N/A 07/16/2017   Procedure: CARDIOVERSION;  Surgeon: Pixie Casino, MD;  Location: [facility] ENDOSCOPY;  Service: Cardiovascular;  Laterality: N/A;   CARDIOVERSION N/A 07/07/2018   Procedure: CARDIOVERSION;  Surgeon: Pixie Casino, MD;  Location: [place];  Service: Cardiovascular;  Laterality: N/A;   CATARACT EXTRACTION, BILATERAL  2012   Dr. Herbert Deaner   COLONOSCOPY WITH PROPOFOL N/A 05/23/2017   Procedure: COLONOSCOPY WITH PROPOFOL;  Surgeon: Wonda Horner, MD;  Location: [facility] ENDOSCOPY;  Service: Endoscopy;  Laterality: N/A;   ESOPHAGOGASTRODUODENOSCOPY N/A 03/01/2020   Procedure: ESOPHAGOGASTRODUODENOSCOPY (EGD);  Surgeon: Otis Brace, MD;  Location: [facility] ENDOSCOPY;  Service: Gastroenterology;  Laterality: N/A;   ESOPHAGOGASTRODUODENOSCOPY (EGD) WITH PROPOFOL N/A 05/23/2017   Procedure: ESOPHAGOGASTRODUODENOSCOPY (EGD) WITH PROPOFOL;  Surgeon: Wonda Horner, MD;  Location: [facility] ENDOSCOPY;  Service: Endoscopy;  Laterality: N/A;   IR KYPHO LUMBAR INC FX REDUCE BONE BX UNI/BIL CANNULATION INC/IMAGING  06/06/2017   IR RADIOLOGIST EVAL & MGMT  07/01/2017   RADICAL HYSTERECTOMY     REPLACEMENT TOTAL KNEE     11/03/07 right....... 03/29/08 left    Social History   Socioeconomic History   Marital status: Divorced    Spouse name: Not on file   Number of children: 1   Years of education: 12    Highest education level: High school graduate  Occupational History   Occupation: Retired    Fish farm manager: RETIRED  Tobacco Use   Smoking status: Former    Types: Cigarettes    Quit date: 10/08/1970    Years since quitting: 51.4   Smokeless tobacco: Never  Vaping Use   Vaping Use: Never used  Substance and Sexual Activity   Alcohol use: No   Drug use: No   Sexual activity: Not on file  Other Topics Concern   Not on file  Social History Narrative   Lives by herself at home, next to her daughter. She enjoys watching tv in her free time.   Social Determinants of Health   Financial Resource Strain: Not on file  Food Insecurity: No Food Insecurity   Worried About Charity fundraiser in the Last Year: Never true   Ran Out of Food in the Last Year: Never true  Transportation Needs: No Data processing manager (Medical): No   Lack of Transportation (Non-Medical): No  Physical Activity: Not on file  Stress: Not on file  Social Connections: Not on file  Intimate Partner Violence: Not on file    Family History  Problem Relation Age of Onset   Arrhythmia Mother        afib   Alzheimer's disease Mother    Stroke Mother    Colon cancer Father    Heart attack Father 52   Diabetes Sister    Arrhythmia Sister        afib   Stroke Maternal Grandmother     ROS: no fevers or chills, productive cough, hemoptysis, dysphasia, odynophagia, melena, hematochezia, dysuria, hematuria, rash, seizure activity, orthopnea, PND, claudication. Remaining systems are negative.  Physical Exam: Well-developed obese in no acute distress.  Skin is warm and dry.  HEENT is normal.  Neck is supple.  Chest is clear to auscultation with normal expansion.  Cardiovascular exam is regular rate and rhythm.  Abdominal exam nontender or distended. No masses palpated. Extremities show trace edema. neuro grossly intact  A/P  1 permanent atrial fibrillation-plan to continue carvedilol at  present dose for rate control. Continue Xarelto.  I wonder if tachycardia may have contributed to her newly reduced LV function.  We will arrange repeat 3-day Zio patch to make sure that her heart rate is controlled.  If not we will add digoxin.  2 cardiomyopathy/chronic systolic congestive heart failure-LV function is again reduced.  Question if this may have been tachycardia mediated.  Plan monitor as outlined above.  Continue carvedilol and Entresto.  Continue spironolactone and Lasix.  Check potassium and renal function.  Note we have not added [place] or Jardiance as she is obese and I am concerned about the risk of peritoneal infection with her pannus.  We will need to repeat echocardiogram once it is clear her heart rate is controlled and all medications titrated.  3 hypertension-patient's blood pressure is controlled today.  Continue present medical regimen.  4 hyperlipidemia-continue statin.  5 coronary artery disease-plan to continue medical therapy with statin therapy.  No aspirin given need for Xarelto.  6 history of CVA-patient will need lifelong anticoagulation.  Continue Xarelto.  7 lower extremity edema-this is controlled.  We will continue diuretics at present dose.  Kirk Ruths, MD

## 2022-03-06 NOTE — Discharge Summary (Signed)
Physician Discharge Summary  Charlotte [name] [id number] DOB: [birth year] DOA: 02/26/2022  PCP: [name], MD  Admit date: 02/26/2022 Discharge date: 03/06/2022  Time spent: 45 minutes  Recommendations for Outpatient Follow-up:  Cardiology follow-up on 6/6, please check BMP at follow-up PCP in 1 to 2 weeks   Discharge Diagnoses:  Acute systolic CHF A-fib with RVR Chronic back pain Obstructive sleep apnea CAD, mild nonobstructive UTI Delirium Obesity   Atrial fibrillation ([place])   Chronic combined systolic and diastolic congestive heart failure [facility])   Discharge Condition: Stable  Diet recommendation: Low-sodium, heart healthy  Filed Weights   03/04/22 0545 03/05/22 0247 03/06/22 0010  Weight: 125.2 kg 124.9 kg 127.1 kg    History of present illness:  86 y.o. female with medical history significant of PAF on Xarelto, chronic combined HFrEF with recovered LVEF and HFpEF, nonobstructive CAD, HTN, HLD, chronic back pain secondary to degenerative lumbar spine arthritis with distant L3 compression and kyphoplasty, obstructive sleep apnea, morbid obesity.  Patient presented to the ED with worsening back pain, unable to ambulate or get out of bed without assistance, her back pain became so bad over the past 24 hours that EMS was called and needed a Hoyer lift to get her into the gurney to bring to the ED.  She states this pain is radiating both caudally and cranially down the midline as well as somewhat laterally at her mid back and feels sharp and shocking at times with motion/position changes.   -In the ED patient was noted to be tachycardic with A-fib and RVR, also noted to be volume overloaded  Hospital Course:   Acute on chronic back pain, h/o fall -T-spine and L-spine CT unremarkable for acute fractures -Multilevel degenerative changes noted and old fractures -PT OT consult completed, plan for SNF -Improving on current regimen of Robaxin, scheduled Tylenol and  lidocaine patch -Finally after much back-and-forth is agreeable to short-term rehab now,  -stable for DC today, FU with PCP in 1 week   A-fib with RVR -Diltiazem drip discontinued in the setting of bradycardia -Continue carvedilol, dose changed to 12.5 mg twice daily -Continue Xarelto,    Acute on chronic systolic and diastolic CHF Acute hypoxic respiratory failure  -Echo now notes drop in EF to 30% with regional wall motion abnormality, down from 55% in 12/20, pulmonary edema on admission -Diuresed on IV Lasix, she is 7 L negative, weight down 8 lbs, transitioned to oral Lasix 40 Mg twice daily also started on Aldactone, Entresto -Followed by cardiology in consultation, ischemic cardiomyopathy felt to be less likely, prior ischemic eval were negative -Remains stable, has a cardiology follow-up on 6/6   UTI Mild delirium -Started on IV ceftriaxone overnight, unfortunately urine cultures were not sent on admission, completed 4 days of ceftriaxone   Chronic obstructive sleep apnea, complex -Continue CPAP   HTN  -Continue Coreg/furosemide   Morbid obesity -Lifestyle modification needed   Code Status: DNR  Consultations: Cardiology  Discharge Exam: Vitals:   03/06/22 0345 03/06/22 0744  BP: 135/89 138/87  Pulse: 90 82  Resp: 18 20  Temp: 98.1 F (36.7 C) 97.7 F (36.5 C)  SpO2: 95% 94%  General exam: Obese pleasant female sitting up in bed, AAOx3, no distress, very mild cognitive deficits HEENT: Neck obese unable to assess JVD CVS: S1-S2, regular rhythm Lungs: Improving air movement, diminished at the bases Abdomen: Soft, obese, nontender, bowel sounds present Extremities: Trace edema  Skin: No rashes on exposed skin Psychiatry: Poor insight  Discharge Instructions   Discharge Instructions     Diet - low sodium heart healthy   Complete by: As directed    Increase activity slowly   Complete by: As directed       Allergies as of 03/06/2022        Reactions   Metformin And Related Other (See Comments)   Renal failure   Amlodipine Other (See Comments)   HEADACHE        Medication List     STOP taking these medications    losartan 25 MG tablet Commonly known as: COZAAR   sulfamethoxazole-trimethoprim 800-160 MG tablet Commonly known as: BACTRIM DS       TAKE these medications    acetaminophen 500 MG tablet Commonly known as: TYLENOL Take 1,000 mg by mouth in the morning and at bedtime.   atorvastatin 40 MG tablet Commonly known as: LIPITOR TAKE 1 TABLET BY MOUTH  DAILY   carvedilol 12.5 MG tablet Commonly known as: COREG Take 1 tablet (12.5 mg total) by mouth 2 (two) times daily with a meal. What changed:  medication strength how much to take   DULoxetine 30 MG capsule Commonly known as: CYMBALTA TAKE 1 CAPSULE BY MOUTH DAILY   furosemide 20 MG tablet Commonly known as: LASIX Take 2 tablets (40 mg total) by mouth 2 (two) times daily. What changed:  how much to take when to take this   IRON PO Take 1 tablet by mouth every other day.   lidocaine 5 % Commonly known as: LIDODERM Place 1 patch onto the skin daily. Remove & Discard patch within 12 hours or as directed by MD   melatonin 5 MG Tabs Take 1 tablet (5 mg total) by mouth at bedtime.   methocarbamol 500 MG tablet Commonly known as: ROBAXIN Take 1 tablet (500 mg total) by mouth every 8 (eight) hours as needed for muscle spasms (or back pain).   nystatin powder Commonly known as: MYCOSTATIN/NYSTOP Apply 1 application topically 3 (three) times daily.   nystatin cream Commonly known as: MYCOSTATIN Apply to affected area 2 times daily   omeprazole 40 MG capsule Commonly known as: PRILOSEC TAKE 1 CAPSULE BY MOUTH  TWICE DAILY IN THE MORNING  AND AT BEDTIME   potassium chloride 10 MEQ tablet Commonly known as: KLOR-CON TAKE 2 TABLET (20 MEQ TOTAL) BY MOUTH 2 (TWO) TIMES DAILY. What changed: additional instructions    sacubitril-valsartan 24-26 MG Commonly known as: ENTRESTO Take 1 tablet by mouth 2 (two) times daily.   spironolactone 25 MG tablet Commonly known as: ALDACTONE Take 0.5 tablets (12.5 mg total) by mouth daily. Start taking on: Mar 07, 2022   Systane ICaps AREDS2 Caps Take 1 capsule by mouth daily.   Xarelto 20 MG Tabs tablet Generic drug: rivaroxaban TAKE 1 TABLET BY MOUTH  DAILY WITH SUPPER               Durable Medical Equipment  (From admission, onward)           Start     Ordered   03/04/22 1438  For home use only DME 3 n 1  Once       Comments: Bariatric Weight 125.2 KG Body habitus  requires bariatric equipment   03/04/22 1440           Allergies  Allergen Reactions   Metformin And Related Other (See Comments)    Renal failure   Amlodipine Other (See Comments)    HEADACHE  The results of significant diagnostics from this hospitalization (including imaging, microbiology, ancillary and laboratory) are listed below for reference.    Significant Diagnostic Studies: CT THORACIC SPINE WO CONTRAST  Result Date: 02/27/2022 CLINICAL DATA:  Mid back pain with no prior imaging. Mid to upper back pain with history of falls but no recent trauma EXAM: CT THORACIC SPINE WITHOUT CONTRAST TECHNIQUE: Multidetector CT images of the thoracic were obtained using the standard protocol without intravenous contrast. RADIATION DOSE REDUCTION: This exam was performed according to the departmental dose-optimization program which includes automated exposure control, adjustment of the mA and/or kV according to patient size and/or use of iterative reconstruction technique. COMPARISON:  None Available. FINDINGS: Alignment: Mild exaggerated thoracic kyphosis.  No listhesis. Vertebrae: No acute fracture or focal pathologic process. Paraspinal and other soft tissues: Negative. Disc levels: Less than typical degenerative changes in the thoracic spine with only mild midthoracic disc  space narrowing and endplate spurring. Negative facets. No visible impingement IMPRESSION: No acute finding. Less than typical thoracic degenerative change for age. Electronically Signed   By: Jorje Guild M.D.   On: 02/27/2022 11:38   CT Lumbar Spine Wo Contrast  Result Date: 02/26/2022 CLINICAL DATA:  Low back pain.  No injury. EXAM: CT LUMBAR SPINE WITHOUT CONTRAST TECHNIQUE: Multidetector CT imaging of the lumbar spine was performed without intravenous contrast administration. Multiplanar CT image reconstructions were also generated. RADIATION DOSE REDUCTION: This exam was performed according to the departmental dose-optimization program which includes automated exposure control, adjustment of the mA and/or kV according to patient size and/or use of iterative reconstruction technique. COMPARISON:  Lumbar spine x-rays dated January 31, 2021. MRI lumbar spine dated April 19, 2018. FINDINGS: Segmentation: 5 lumbar type vertebrae. Alignment: Unchanged mild levoscoliosis. Unchanged trace anterolisthesis at L4-L5. New trace anterolisthesis at L2-L3. Vertebrae: No acute fracture focal pathologic process. Chronic L3 compression deformity status post cement augmentation. Asymmetric left-sided degenerative endplate sclerosis at 075-GRM. Paraspinal and other soft tissues: Trace right pleural effusion. Mild interlobular septal thickening in the included lung bases. Aortoiliac atherosclerotic vascular disease. Disc levels: Mild disc bulging from L1-L2 through L4-L5. Similar mild spinal canal stenosis at L1-L2 and L2-L3. Lower lumbar facet arthropathy, severe on the left at L5-S1. Multilevel neuroforaminal stenosis, severe on the right at L2-L3 and L3-L4, and moderate bilaterally at L4-L5. This has progressed since 2019. IMPRESSION: 1. No acute osseous abnormality. 2. Chronic L3 compression deformity status post cement augmentation. 3. Multilevel degenerative changes as described above, progressed since 2018. Severe right  neuroforaminal stenosis at L2-L3 and L3-L4. 4. Trace right pleural effusion with mild interstitial pulmonary edema in the included lung bases. 5. Aortic Atherosclerosis (ICD10-I70.0). Electronically Signed   By: Titus Dubin M.D.   On: 02/26/2022 15:14   DG CHEST PORT 1 VIEW  Result Date: 02/27/2022 CLINICAL DATA:  CHF.  Back pain. EXAM: PORTABLE CHEST 1 VIEW COMPARISON:  02/26/2022 FINDINGS: 0704 hours. The cardio pericardial silhouette is enlarged. There is pulmonary vascular congestion without overt pulmonary edema. Interstitial pulmonary edema pattern seen previously has improved in the interval. No substantial pleural effusion. Telemetry leads overlie the chest. IMPRESSION: Interval improvement in interstitial pulmonary edema pattern. Electronically Signed   By: Misty Stanley M.D.   On: 02/27/2022 07:27   DG Chest Port 1 View  Result Date: 02/26/2022 CLINICAL DATA:  Tachycardia. EXAM: PORTABLE CHEST 1 VIEW COMPARISON:  02/29/2020 FINDINGS: Cardiac enlargement. Pulmonary vascular congestion and mild diffuse interstitial edema identified. No sign of pleural effusion or airspace consolidation. Visualized  osseous structures appear intact. IMPRESSION: Cardiac enlargement and mild interstitial edema. Electronically Signed   By: Kerby Moors M.D.   On: 02/26/2022 16:20   ECHOCARDIOGRAM COMPLETE  Result Date: 02/27/2022    ECHOCARDIOGRAM REPORT   Patient Name:   Charlotte [name] Date of Exam: 02/27/2022 Medical Rec #:  [id number]    Height:       63.0 in Accession #:    [id number]   Weight:       297.5 lb Date of Birth:  [birth year]     BSA:          2.289 m Patient Age:    27 years     BP:           122/73 mmHg Patient Gender: F            HR:           74 bpm. Exam Location:  Inpatient Procedure: 2D Echo, Cardiac Doppler, Color Doppler and Intracardiac            Opacification Agent Indications:    CHF  History:        Patient has prior history of Echocardiogram examinations, most                 recent  10/08/2019. CHF, CAD, Stroke, Arrythmias:LBBB,                 Signs/Symptoms:Murmur and Syncope; Risk Factors:Dyslipidemia and                 Hypertension. 07/07/2018 cardioversion.  Sonographer:    Luisa Hart RDCS Referring Phys: [id number] Lequita Halt  Sonographer Comments: Technically difficult study due to poor echo windows and patient is morbidly obese. Image acquisition challenging due to patient body habitus. IMPRESSIONS  1. Left ventricular ejection fraction, by estimation, is 30%. The left ventricle has severely decreased function. The left ventricle demonstrates regional wall motion abnormalities (abnormal septal motion). Left ventricular diastolic parameters are indeterminate.  2. Right ventricular systolic function is normal. The right ventricular size is normal.  3. Left atrial size was mildly dilated.  4. The mitral valve is grossly normal. No evidence of mitral valve regurgitation. The mean mitral valve gradient is 2.0 mmHg.  5. The aortic valve was not well visualized. Aortic valve regurgitation is trivial. No aortic stenosis is present. Comparison(s): Technically difficult study due to difficult PLAX views. Consider Caridac MRI if clinically indicated. FINDINGS  Left Ventricle: Left ventricular ejection fraction, by estimation, is 30%. The left ventricle has severely decreased function. The left ventricle demonstrates regional wall motion abnormalities. Definity contrast agent was given IV to delineate the left  ventricular endocardial borders. The left ventricular internal cavity size was normal in size. Suboptimal image quality limits for assessment of left ventricular hypertrophy. Abnormal (paradoxical) septal motion, consistent with left bundle branch block. Left ventricular diastolic parameters are indeterminate.  LV Wall Scoring: The entire septum is hypokinetic. Right Ventricle: The right ventricular size is normal. No increase in right ventricular wall thickness. Right ventricular systolic  function is normal. Left Atrium: Left atrial size was mildly dilated. Right Atrium: Right atrial size was normal in size. Pericardium: Trivial pericardial effusion is present. Presence of epicardial fat layer. Mitral Valve: The mitral valve is grossly normal. No evidence of mitral valve regurgitation. MV peak gradient, 6.0 mmHg. The mean mitral valve gradient is 2.0 mmHg. Tricuspid Valve: The tricuspid valve is normal in structure. Tricuspid valve regurgitation is not demonstrated. Aortic Valve:  The aortic valve was not well visualized. Aortic valve regurgitation is trivial. No aortic stenosis is present. Aortic valve mean gradient measures 3.3 mmHg. Aortic valve peak gradient measures 6.8 mmHg. Aortic valve area, by VTI measures 2.50 cm. Pulmonic Valve: The pulmonic valve was not well visualized. Pulmonic valve regurgitation is not visualized. No evidence of pulmonic stenosis. Aorta: The aortic root and ascending aorta are structurally normal, with no evidence of dilitation. IAS/Shunts: No atrial level shunt detected by color flow Doppler.  LEFT VENTRICLE PLAX 2D LVIDd:         4.20 cm LVIDs:         3.80 cm LV PW:         1.50 cm LV IVS:        1.40 cm LVOT diam:     2.10 cm LV SV:         56 LV SV Index:   24 LVOT Area:     3.46 cm  RIGHT VENTRICLE RV Basal diam:  2.60 cm RV Mid diam:    2.10 cm RV S prime:     11.50 cm/s LEFT ATRIUM             Index        RIGHT ATRIUM           Index LA diam:        4.50 cm 1.97 cm/m   RA Area:     20.00 cm LA Vol (A2C):   40.9 ml 17.87 ml/m  RA Volume:   54.00 ml  23.59 ml/m LA Vol (A4C):   96.8 ml 42.29 ml/m LA Biplane Vol: 67.2 ml 29.36 ml/m  AORTIC VALVE                    PULMONIC VALVE AV Area (Vmax):    2.34 cm     PV Vmax:       0.84 m/s AV Area (Vmean):   2.33 cm     PV Peak grad:  2.8 mmHg AV Area (VTI):     2.50 cm AV Vmax:           130.00 cm/s AV Vmean:          86.600 cm/s AV VTI:            0.224 m AV Peak Grad:      6.8 mmHg AV Mean Grad:      3.3  mmHg LVOT Vmax:         87.80 cm/s LVOT Vmean:        58.350 cm/s LVOT VTI:          0.162 m LVOT/AV VTI ratio: 0.72  AORTA Ao Asc diam: 2.90 cm MITRAL VALVE              TRICUSPID VALVE MV Peak grad: 6.0 mmHg    TR Peak grad:   43.6 mmHg MV Mean grad: 2.0 mmHg    TR Vmax:        330.00 cm/s MV Vmax:      1.22 m/s MV Vmean:     67.3 cm/s   SHUNTS MR Peak grad: 80.3 mmHg   Systemic VTI:  0.16 m MR Mean grad: 52.0 mmHg   Systemic Diam: 2.10 cm MR Vmax:      448.00 cm/s MR Vmean:     342.0 cm/s Rudean Haskell MD Electronically signed by Rudean Haskell MD Signature Date/Time: 02/27/2022/11:27:08 AM    Final  Microbiology: No results found for this or any previous visit (from the past 240 hour(s)).   Labs: Basic Metabolic Panel: Recent Labs  Lab 02/28/22 0557 03/01/22 0415 03/02/22 0408 03/03/22 0434 03/04/22 0312 03/05/22 0139 03/06/22 0335  NA 140 137 139 139 141 137 135  K 4.2 4.0 3.8 3.5 3.7 4.1 4.1  CL 105 103 103 105 104 103 105  CO2 26 26 28 27 27 27 22   GLUCOSE 126* 116* 123* 124* 115* 123* 110*  BUN 22 25* 31* 34* 34* 34* 34*  CREATININE 0.84 0.85 0.95 1.04* 0.99 1.20* 0.92  CALCIUM 8.9 8.8* 9.0 8.9 9.0 8.9 8.8*  MG 2.2 2.1  --   --   --   --   --    Liver Function Tests: No results for input(s): AST, ALT, ALKPHOS, BILITOT, PROT, ALBUMIN in the last 168 hours. No results for input(s): LIPASE, AMYLASE in the last 168 hours. No results for input(s): AMMONIA in the last 168 hours. CBC: Recent Labs  Lab 02/28/22 0557 03/01/22 0415  WBC 8.7 7.6  HGB 13.9 14.3  HCT 43.3 45.0  MCV 93.1 93.4  PLT 227 223   Cardiac Enzymes: No results for input(s): CKTOTAL, CKMB, CKMBINDEX, TROPONINI in the last 168 hours. BNP: BNP (last 3 results) Recent Labs    01/30/22 0000 02/26/22 1405  BNP 578* 1,365.1*    ProBNP (last 3 results) No results for input(s): PROBNP in the last 8760 hours.  CBG: No results for input(s): GLUCAP in the last 168  hours.     Signed:  Domenic Polite MD.  [facility] 03/06/2022, 10:17 AM

## 2022-03-06 NOTE — Consult Note (Signed)
[facility] CM Inpatient Consult   03/06/2022  ANJELICA GORNIAK 15-Jan-1936 [id number]  [facility] Follow Up:   Per review, current disposition is for skilled nursing facility. No [facility] CM post hospital chronic care coordination needs as needs will be met at SNF level of care.  Of note, [facility] Care Management services does not replace or interfere with any services that are arranged by inpatient case management or social work.   Netta Cedars, MSN, RN [facility] Liaison Toll free office [phone]

## 2022-03-06 NOTE — Progress Notes (Signed)
Heart Failure Stewardship Pharmacist Progress Note   PCP: [name], MD PCP-Cardiologist: Jenkins Rouge, MD    HPI:  86 yo female with PMH significant for Afib (s/p DCCV 2014, 2018, 2019) on Xarelto, nonobstructive CAD on LHC in 2011, chronic combined systolic and diastolic HFimpEF, chronic back pain, GERD, HLD, HTN, LBBB, NSVT in 2011, osteoarthritis, pre-DM, OSA on home CPAP, and h/o CVA.   Patient presented to [facility] ED 05/22 with chief complaint of back pain after fall 3 weeks ago in addition to urinary frequency, weakness and dyspnea.  Found to be in Afib with RVR and started on diltiazem gtt which was transitioned to amiodarone after developing bradycardia (HR low 50s).  Volume overload evidenced by 2+ edema and increased breathing effort on exam.  Evidence of mild interstitial pulmonary edema on CT lumbar spine and CXR.  Echo 02/2013 with LVEF 25-30%, mild LVH, moderate AI / MR. Later improved to LVEF 60-65% in 05/2013 with new G1DD.  Reduced slightly in 2015 and 2017 to LVEF 55-60%, down to 45-50% in 06/2018 and back up to 55-60% in 09/2019.  Echo this admission shows newly reduced LVEF 30% and normal RV.   Notably, patient found to have UTI and developed delirium this admission - UA with +nitrites, leukocytes, and pyuria (WBC 21-50).  No urinary symptoms outside of frequency were reported by ED provider (had received IV Lasix 1 hour prior).  No urine culture collected.  Patient has history of recurrent UTI's with most recent E. Coli UTI on 04/25 that was treated with Bactrim.  Repeat CXR 05/23 showed improvement in pulmonary edema pattern from 05/22, but patient continued to report SOB and with 2+ edema on exam 05/24.  IV Lasix increased from 20 mg BID > 40 mg BID, spironolactone 12.5 mg daily and carvedilol increased to 12.5 mg BID.  Patient remained in Afib 05/25, carvedilol titrated to 25 mg BID but had soft BP and dose reduced to 12.5 mg BID; losartan changed to Entresto.     Current HF Medications: Diuretic: furosemide PO 40 mg daily Beta Blocker: carvedilol 12.5 mg twice daily ACE/ARB/ARNI: Entresto 24-26 mg BID Aldosterone Antagonist: spironolactone 12.5 mg daily Other: potassium 40 mEq daily  Prior to admission HF Medications: Diuretic: furosemide 20 mg twice daily Beta blocker: carvedilol 25 mg twice daily ACE/ARB/ARNI: losartan 25 mg daily Other: potassium 10 mEq twice daily  Pertinent Lab Values: Serum creatinine 0.93, BUN 34, Potassium 4.1 (on 37mEq daily), Sodium 135, BNP 1365.1, Magnesium 2.1, A1c 5.7% (01/2022), Digoxin 0.4 - not on PTA  Vital Signs: Weight: 280 lbs (admission weight: 282.6 lbs) Blood pressure: 100-120/80s Heart rate: 80-90s - Afib with intermittent RVR I/O: -1.05 L yesterday; net -5.923 L  Medication Assistance / Insurance Benefits Check: Does the patient have prescription insurance?  Yes Type of insurance plan: [facility] Medicare  Outpatient Pharmacy:  Prior to admission outpatient pharmacy: [facility] [place] Is the patient willing to use [place] at discharge? Yes Is the patient willing to transition their outpatient pharmacy to utilize a [facility] outpatient pharmacy?   No - wanted to keep [facility]    Assessment: 1. Acute on chronic combined diastolic and systolic CHF with newly reduced LVEF of 30%, likely secondary to uncontrolled Afib.  NYHA class II symptoms. - Continue furosemide PO 40 mg daily - Continue carvedilol 12.5 mg BID - soft BP limiting further titration - Continue Entresto 24-26 mg BID - Continue spironolactone 12.5 mg daily - No SGLT2i with history of  recurrent UTI's - Currently on potassium 40 mEq daily to maintain K > 4  Plan: 1) Medication changes recommended at this time: - Agree with transition to Lasix PO 40 mg twice daily - Reduce potassium supplementation to 20 mEq daily  2) Patient assistance: Delene Loll = $47 copay  - She wanted to think about applying for assistance as she  didn't know if she wanted to stay on this medication after discharge.   3)  Education  - Patient has been educated on current HF medications and potential additions to HF medication regimen - Patient verbalizes understanding that over the next few months, these medication doses may change and more medications may be added to optimize HF regimen - Patient has been educated on basic disease state pathophysiology and goals of therapy  Laurey Arrow, PharmD PGY1 Pharmacy Resident 03/06/2022  8:42 AM

## 2022-03-06 NOTE — TOC Transition Note (Signed)
Transition of Care [facility]) - CM/SW Discharge Note   Patient Details  Name: Charlotte [name] MRN: [id number] Date of Birth: 05-07-1936  Transition of Care [facility]) CM/SW Contact:  Bethann Berkshire, [place] Phone Number: 03/06/2022, 11:00 AM   Clinical Narrative:     Auth approved 5/30 - 6/1 [id number]  Patient will DC [id number] Anticipated DC date: 03/06/22 Family notified: Daughter Butch Penny Transport by: Corey Harold   Per MD patient ready for DC to [place]. RN, patient, patient's family, and facility notified of DC. Discharge Summary and FL2 sent to facility. RN to call report prior to discharge [phone]  ). DC packet on chart. Ambulance transport requested for patient.   CSW will sign off for now as social work intervention is no longer needed. Please consult [place] again if new needs arise.   Final next level of care: Skilled Nursing Facility Barriers to Discharge: No Barriers Identified   Patient Goals and CMS Choice Patient states their goals for this hospitalization and ongoing recovery are:: return home   Choice offered to / list presented to : NA  Discharge Placement              Patient chooses bed at: [facility] Patient to be transferred to facility by: [place] Name of family member notified: Daughter Butch Penny Patient and family notified of of transfer: 03/06/22  Discharge Plan and Services In-house Referral: NA Discharge Planning Services: CM Consult              DME Agency: NA       HH Arranged: NA          Social Determinants of Health (SDOH) Interventions     Readmission Risk Interventions     View : No data to display.

## 2022-03-08 ENCOUNTER — Other Ambulatory Visit: Payer: Self-pay | Admitting: *Deleted

## 2022-03-08 DIAGNOSIS — R262 Difficulty in walking, not elsewhere classified: Secondary | ICD-10-CM | POA: Diagnosis not present

## 2022-03-08 DIAGNOSIS — M545 Low back pain, unspecified: Secondary | ICD-10-CM | POA: Diagnosis not present

## 2022-03-08 DIAGNOSIS — Z9181 History of falling: Secondary | ICD-10-CM | POA: Diagnosis not present

## 2022-03-08 DIAGNOSIS — E119 Type 2 diabetes mellitus without complications: Secondary | ICD-10-CM | POA: Diagnosis not present

## 2022-03-08 DIAGNOSIS — M6281 Muscle weakness (generalized): Secondary | ICD-10-CM | POA: Diagnosis not present

## 2022-03-08 DIAGNOSIS — G4733 Obstructive sleep apnea (adult) (pediatric): Secondary | ICD-10-CM | POA: Diagnosis not present

## 2022-03-08 DIAGNOSIS — I509 Heart failure, unspecified: Secondary | ICD-10-CM | POA: Diagnosis not present

## 2022-03-08 NOTE — Patient Outreach (Signed)

## 2022-03-09 DIAGNOSIS — R262 Difficulty in walking, not elsewhere classified: Secondary | ICD-10-CM | POA: Diagnosis not present

## 2022-03-09 DIAGNOSIS — M6281 Muscle weakness (generalized): Secondary | ICD-10-CM | POA: Diagnosis not present

## 2022-03-09 DIAGNOSIS — I509 Heart failure, unspecified: Secondary | ICD-10-CM | POA: Diagnosis not present

## 2022-03-09 DIAGNOSIS — E119 Type 2 diabetes mellitus without complications: Secondary | ICD-10-CM | POA: Diagnosis not present

## 2022-03-09 DIAGNOSIS — M545 Low back pain, unspecified: Secondary | ICD-10-CM | POA: Diagnosis not present

## 2022-03-09 DIAGNOSIS — Z9181 History of falling: Secondary | ICD-10-CM | POA: Diagnosis not present

## 2022-03-09 DIAGNOSIS — G4733 Obstructive sleep apnea (adult) (pediatric): Secondary | ICD-10-CM | POA: Diagnosis not present

## 2022-03-13 ENCOUNTER — Ambulatory Visit: Payer: Medicare Other | Admitting: Cardiology

## 2022-03-13 ENCOUNTER — Ambulatory Visit (INDEPENDENT_AMBULATORY_CARE_PROVIDER_SITE_OTHER): Payer: Medicare Other

## 2022-03-13 ENCOUNTER — Encounter: Payer: Self-pay | Admitting: Cardiology

## 2022-03-13 VITALS — BP 118/68 | HR 65 | Ht 63.0 in | Wt 281.0 lb

## 2022-03-13 DIAGNOSIS — I10 Essential (primary) hypertension: Secondary | ICD-10-CM | POA: Diagnosis not present

## 2022-03-13 DIAGNOSIS — I420 Dilated cardiomyopathy: Secondary | ICD-10-CM

## 2022-03-13 DIAGNOSIS — I2510 Atherosclerotic heart disease of native coronary artery without angina pectoris: Secondary | ICD-10-CM | POA: Diagnosis not present

## 2022-03-13 DIAGNOSIS — I4821 Permanent atrial fibrillation: Secondary | ICD-10-CM

## 2022-03-13 DIAGNOSIS — Z9181 History of falling: Secondary | ICD-10-CM | POA: Diagnosis not present

## 2022-03-13 DIAGNOSIS — R262 Difficulty in walking, not elsewhere classified: Secondary | ICD-10-CM | POA: Diagnosis not present

## 2022-03-13 DIAGNOSIS — M6281 Muscle weakness (generalized): Secondary | ICD-10-CM | POA: Diagnosis not present

## 2022-03-13 DIAGNOSIS — G4733 Obstructive sleep apnea (adult) (pediatric): Secondary | ICD-10-CM | POA: Diagnosis not present

## 2022-03-13 DIAGNOSIS — I509 Heart failure, unspecified: Secondary | ICD-10-CM | POA: Diagnosis not present

## 2022-03-13 DIAGNOSIS — M545 Low back pain, unspecified: Secondary | ICD-10-CM | POA: Diagnosis not present

## 2022-03-13 DIAGNOSIS — E119 Type 2 diabetes mellitus without complications: Secondary | ICD-10-CM | POA: Diagnosis not present

## 2022-03-13 NOTE — Patient Instructions (Signed)
Testing/Procedures:  Bryn Gulling- Long Term Monitor Instructions  Your physician has requested you [place] a ZIO patch monitor for 3 days.  This is a single patch monitor. [employer] supplies one patch monitor per enrollment. Additional stickers are not available. Please do not apply patch if you will be having a Nuclear Stress Test,  Echocardiogram, Cardiac CT, MRI, or Chest Xray during the period you would be wearing the  monitor. The patch cannot be worn during these tests. You cannot remove and re-apply the  ZIO XT patch monitor.  Your ZIO patch monitor will be mailed 3 day USPS to your address on file. It may take 3-5 days  to receive your monitor after you have been enrolled.  Once you have received your monitor, please review the enclosed instructions. Your monitor  has already been registered assigning a specific monitor serial # to you.  Billing and Patient Assistance Program Information  We have supplied [employer] with any of your insurance information on file for billing purposes. [employer] offers a sliding scale Patient Assistance Program for patients that do not have  insurance, or whose insurance does not completely cover the cost of the ZIO monitor.  You must apply for the Patient Assistance Program to qualify for this discounted rate.  To apply, please call [employer] at [phone], select option 4, select option 2, ask to apply for  Patient Assistance Program. Theodore Demark will ask your household income, and how many people  are in your household. They will quote your out-of-pocket cost based on that information.  [employer] will also be able to set up a 46-month interest-free payment plan if needed.  Applying the monitor   Shave hair from upper left chest.  Hold abrader disc by orange tab. Rub abrader in 40 strokes over the upper left chest as  indicated in your monitor instructions.  Clean area with 4 enclosed alcohol pads. Let dry.  Apply patch as indicated in monitor instructions.  Patch will be placed under collarbone on left  side of chest with arrow pointing upward.  Rub patch adhesive wings for 2 minutes. Remove white label marked "1". Remove the white  label marked "2". Rub patch adhesive wings for 2 additional minutes.  While looking in a mirror, press and release button in center of patch. A small green light will  flash 3-4 times. This will be your only indicator that the monitor has been turned on.  Do not shower for the first 24 hours. You may shower after the first 24 hours.  Press the button if you feel a symptom. You will hear a small click. Record Date, Time and  Symptom in the Patient Logbook.  When you are ready to remove the patch, follow instructions on the last 2 pages of Patient  Logbook. Stick patch monitor onto the last page of Patient Logbook.  Place Patient Logbook in the blue and white box. Use locking tab on box and tape box closed  securely. The blue and white box has prepaid postage on it. Please place it in the mailbox as  soon as possible. Your physician should have your test results approximately 7 days after the  monitor has been [facility]  Call IMonsonat [phone] you have questions regarding  your ZIO XT patch monitor. Call them immediately if you see an orange light blinking on your  monitor.  If your monitor falls off in less than 4 days, contact our Monitor department at [phone]  If your monitor becomes loose or falls off after 4 days call [employer] at [phone] for  suggestions on securing your monitor    Follow-Up: At [facility], you and your health needs are our priority.  As part of our continuing mission to provide you with exceptional heart care, we have created designated Provider Care Teams.  These Care Teams include your primary Cardiologist (physician) and Advanced Practice Providers (APPs -  Physician Assistants and Nurse Practitioners) who all work together  to provide you with the care you need, when you need it.  We recommend signing up for the patient portal called "MyChart".  Sign up information is provided on this After Visit Summary.  MyChart is used to connect with patients for Virtual Visits (Telemedicine).  Patients are able to view lab/test results, encounter notes, upcoming appointments, etc.  Non-urgent messages can be sent to your provider as well.   To learn more about what you can do with MyChart, go to [email or web address].    Your next appointment:   6-8 week(s)  The format for your next appointment:   In Person  Provider:    ANY APP    Important Information About Sugar

## 2022-03-13 NOTE — Progress Notes (Unsigned)
Enrolled for [employer] to mail a ZIO XT long term holter monitor to the patients address on file.  

## 2022-03-14 DIAGNOSIS — I10 Essential (primary) hypertension: Secondary | ICD-10-CM | POA: Diagnosis not present

## 2022-03-14 DIAGNOSIS — M5441 Lumbago with sciatica, right side: Secondary | ICD-10-CM | POA: Diagnosis not present

## 2022-03-14 DIAGNOSIS — I480 Paroxysmal atrial fibrillation: Secondary | ICD-10-CM | POA: Diagnosis not present

## 2022-03-14 DIAGNOSIS — M5442 Lumbago with sciatica, left side: Secondary | ICD-10-CM | POA: Diagnosis not present

## 2022-03-14 DIAGNOSIS — G8929 Other chronic pain: Secondary | ICD-10-CM | POA: Diagnosis not present

## 2022-03-14 DIAGNOSIS — F32A Depression, unspecified: Secondary | ICD-10-CM | POA: Diagnosis not present

## 2022-03-14 DIAGNOSIS — W19XXXA Unspecified fall, initial encounter: Secondary | ICD-10-CM | POA: Diagnosis not present

## 2022-03-14 DIAGNOSIS — I5023 Acute on chronic systolic (congestive) heart failure: Secondary | ICD-10-CM | POA: Diagnosis not present

## 2022-03-14 DIAGNOSIS — G4733 Obstructive sleep apnea (adult) (pediatric): Secondary | ICD-10-CM | POA: Diagnosis not present

## 2022-03-14 LAB — BASIC METABOLIC PANEL
BUN/Creatinine Ratio: 23 (ref 12–28)
BUN: 28 mg/dL — ABNORMAL HIGH (ref 8–27)
CO2: 20 mmol/L (ref 20–29)
Calcium: 9.2 mg/dL (ref 8.7–10.3)
Chloride: 103 mmol/L (ref 96–106)
Creatinine, Ser: 1.23 mg/dL — ABNORMAL HIGH (ref 0.57–1.00)
Glucose: 122 mg/dL — ABNORMAL HIGH (ref 70–99)
Potassium: 4.7 mmol/L (ref 3.5–5.2)
Sodium: 138 mmol/L (ref 134–144)
eGFR: 43 mL/min/{1.73_m2} — ABNORMAL LOW (ref >59)

## 2022-03-15 ENCOUNTER — Encounter: Payer: Self-pay | Admitting: Family Medicine

## 2022-03-15 ENCOUNTER — Encounter: Payer: Self-pay | Admitting: *Deleted

## 2022-03-16 DIAGNOSIS — R262 Difficulty in walking, not elsewhere classified: Secondary | ICD-10-CM | POA: Diagnosis not present

## 2022-03-16 DIAGNOSIS — Z789 Other specified health status: Secondary | ICD-10-CM | POA: Diagnosis not present

## 2022-03-16 DIAGNOSIS — I5023 Acute on chronic systolic (congestive) heart failure: Secondary | ICD-10-CM | POA: Diagnosis not present

## 2022-03-16 DIAGNOSIS — I480 Paroxysmal atrial fibrillation: Secondary | ICD-10-CM | POA: Diagnosis not present

## 2022-03-16 DIAGNOSIS — L304 Erythema intertrigo: Secondary | ICD-10-CM | POA: Diagnosis not present

## 2022-03-16 DIAGNOSIS — I119 Hypertensive heart disease without heart failure: Secondary | ICD-10-CM | POA: Diagnosis not present

## 2022-03-16 DIAGNOSIS — S32000A Wedge compression fracture of unspecified lumbar vertebra, initial encounter for closed fracture: Secondary | ICD-10-CM | POA: Diagnosis not present

## 2022-03-16 DIAGNOSIS — G4733 Obstructive sleep apnea (adult) (pediatric): Secondary | ICD-10-CM | POA: Diagnosis not present

## 2022-03-17 DIAGNOSIS — G4733 Obstructive sleep apnea (adult) (pediatric): Secondary | ICD-10-CM | POA: Diagnosis not present

## 2022-03-17 DIAGNOSIS — M5441 Lumbago with sciatica, right side: Secondary | ICD-10-CM | POA: Diagnosis not present

## 2022-03-17 DIAGNOSIS — M5442 Lumbago with sciatica, left side: Secondary | ICD-10-CM | POA: Diagnosis not present

## 2022-03-17 DIAGNOSIS — I509 Heart failure, unspecified: Secondary | ICD-10-CM | POA: Diagnosis not present

## 2022-03-17 DIAGNOSIS — G8929 Other chronic pain: Secondary | ICD-10-CM | POA: Diagnosis not present

## 2022-03-17 DIAGNOSIS — M6281 Muscle weakness (generalized): Secondary | ICD-10-CM | POA: Diagnosis not present

## 2022-03-17 DIAGNOSIS — E119 Type 2 diabetes mellitus without complications: Secondary | ICD-10-CM | POA: Diagnosis not present

## 2022-03-17 DIAGNOSIS — I480 Paroxysmal atrial fibrillation: Secondary | ICD-10-CM | POA: Diagnosis not present

## 2022-03-17 DIAGNOSIS — Z9181 History of falling: Secondary | ICD-10-CM | POA: Diagnosis not present

## 2022-03-17 DIAGNOSIS — M545 Low back pain, unspecified: Secondary | ICD-10-CM | POA: Diagnosis not present

## 2022-03-17 DIAGNOSIS — I5023 Acute on chronic systolic (congestive) heart failure: Secondary | ICD-10-CM | POA: Diagnosis not present

## 2022-03-17 DIAGNOSIS — R262 Difficulty in walking, not elsewhere classified: Secondary | ICD-10-CM | POA: Diagnosis not present

## 2022-03-18 DIAGNOSIS — I4821 Permanent atrial fibrillation: Secondary | ICD-10-CM

## 2022-03-19 DIAGNOSIS — M545 Low back pain, unspecified: Secondary | ICD-10-CM | POA: Diagnosis not present

## 2022-03-19 DIAGNOSIS — I509 Heart failure, unspecified: Secondary | ICD-10-CM | POA: Diagnosis not present

## 2022-03-19 DIAGNOSIS — M6281 Muscle weakness (generalized): Secondary | ICD-10-CM | POA: Diagnosis not present

## 2022-03-19 DIAGNOSIS — G4733 Obstructive sleep apnea (adult) (pediatric): Secondary | ICD-10-CM | POA: Diagnosis not present

## 2022-03-19 DIAGNOSIS — R262 Difficulty in walking, not elsewhere classified: Secondary | ICD-10-CM | POA: Diagnosis not present

## 2022-03-19 DIAGNOSIS — Z9181 History of falling: Secondary | ICD-10-CM | POA: Diagnosis not present

## 2022-03-19 DIAGNOSIS — E119 Type 2 diabetes mellitus without complications: Secondary | ICD-10-CM | POA: Diagnosis not present

## 2022-03-20 MED ORDER — FLUCONAZOLE 150 MG PO TABS
150.0000 mg | ORAL_TABLET | Freq: Once | ORAL | 0 refills | Status: AC
Start: 2022-03-20 — End: 2022-03-20

## 2022-03-20 NOTE — Addendum Note (Signed)
Addended by: [name] D on: 03/20/2022 12:38 PM   Modules accepted: Orders

## 2022-03-20 NOTE — Telephone Encounter (Signed)
Meds ordered this encounter  Medications   fluconazole (DIFLUCAN) 150 MG tablet    Sig: Take 1 tablet (150 mg total) by mouth once for 1 dose.    Dispense:  2 tablet    Refill:  0

## 2022-03-21 ENCOUNTER — Other Ambulatory Visit: Payer: Self-pay

## 2022-03-21 ENCOUNTER — Other Ambulatory Visit: Payer: Self-pay | Admitting: *Deleted

## 2022-03-21 DIAGNOSIS — I5042 Chronic combined systolic (congestive) and diastolic (congestive) heart failure: Secondary | ICD-10-CM | POA: Diagnosis not present

## 2022-03-21 DIAGNOSIS — M47816 Spondylosis without myelopathy or radiculopathy, lumbar region: Secondary | ICD-10-CM | POA: Diagnosis not present

## 2022-03-21 DIAGNOSIS — G8929 Other chronic pain: Secondary | ICD-10-CM | POA: Diagnosis not present

## 2022-03-21 DIAGNOSIS — I480 Paroxysmal atrial fibrillation: Secondary | ICD-10-CM | POA: Diagnosis not present

## 2022-03-21 DIAGNOSIS — I110 Hypertensive heart disease with heart failure: Secondary | ICD-10-CM | POA: Diagnosis not present

## 2022-03-21 DIAGNOSIS — D649 Anemia, unspecified: Secondary | ICD-10-CM | POA: Diagnosis not present

## 2022-03-21 NOTE — Patient Outreach (Signed)
[facility] Post- Acute Care Coordinator follow up. Member screened for potential [facility] services as a benefit of Ms. [employer] insurance plan.   Verified in [facility] that Ms. Koepp transitioned from [facility] to home on 03/19/22. Previously agreed to [facility] Care Management follow up.  Telephone call made to daughter/DPR Lizanne Erker [phone]. Patient identifiers confirmed. [name] endorses member is at home. [facility] to visit today. [name] states she is familiar with [facility] mobile meals program. States member could benefit from meals.    [name] endorses member's PCP appointment is July 11th and that she takes Ms. Jun to her appointments. [name] reports Ms. Geist's phone usually does not accept unknown callers. [name] asks that Ms. Snavely be contacted first at [phone]. If unable to reach, call [name] (daughter/DPR) at [phone]. [name] states she will be able to put the contact numbers in Ms. Bezek's phone.   Confirmed address on file is correct for member.   Will make referral to [facility] RNCM for care coordination. Ms. Vogler has medical history of CHF, atrial fib with RVR, CAD, HTN.   Will make referral to [facility] Care Guide for SDOH for mobile meals (heart friendly meals) thru [facility].    Raiford Noble, MSN, RN,BSN [facility] Post Acute Care Coordinator [phone] [facility]) [phone]  (Toll free office)

## 2022-03-22 ENCOUNTER — Telehealth: Payer: Self-pay

## 2022-03-22 ENCOUNTER — Telehealth: Payer: Self-pay | Admitting: Neurology

## 2022-03-22 ENCOUNTER — Telehealth: Payer: Self-pay | Admitting: *Deleted

## 2022-03-22 NOTE — Telephone Encounter (Signed)
Misty with [facility] (OT) called for vo for OT 1 x week for 4 weeks for balance/patient education. [phone]. Tried to call number provided with no answer and vm full. Called [facility] at [phone] and gave verbal order for OT.

## 2022-03-22 NOTE — Chronic Care Management (AMB) (Signed)
  Care Management   Outreach Note  03/22/2022 Name: Charlotte [name] MRN: [id number] DOB: 06-27-1936  Referred by: Agapito Games, MD Reason for referral : Care Coordination (Initial outreach to schedule referral with [facility] )   An unsuccessful telephone outreach was attempted today. The patient was referred to the case management team for assistance with care management and care coordination.   Follow Up Plan:  A HIPAA compliant phone message was left for the patient providing contact information and requesting a return call.   Burman Nieves, CCMA Care Guide, Embedded Care Coordination [facility]  Care Management  Direct Dial: [phone]

## 2022-03-22 NOTE — Telephone Encounter (Signed)
Telephone encounter was:  Unsuccessful.  03/22/2022 Name: Charlotte [name] MRN: [id number] DOB: 1936-04-08  Unsuccessful outbound call made today to assist with:   Mom's Meals.  Outreach Attempt:  1st Attempt  A HIPAA compliant voice message was left requesting a return call.  Instructed patient to call back at [phone]. Left message on voicemail to inform patient that a request has been sent to [employer].  Attempted to call patient's daughter Tamike Seggerman but she was unable to talk.  Kwesi Sangha, AAS Paralegal, [facility] Care Guide  Embedded Care Coordination [place]  Care Management  [address] [place], [place] [address] ??[email or web address]  ?? [phone]   [email or web address]

## 2022-03-23 ENCOUNTER — Encounter: Payer: Self-pay | Admitting: Family Medicine

## 2022-03-23 ENCOUNTER — Telehealth: Payer: Self-pay | Admitting: *Deleted

## 2022-03-23 NOTE — Telephone Encounter (Signed)
Brad with [facility] called asking for VO for PT. 1 week -7.  VO given.Marland Kitchen

## 2022-03-27 ENCOUNTER — Telehealth: Payer: Self-pay

## 2022-03-27 DIAGNOSIS — I509 Heart failure, unspecified: Secondary | ICD-10-CM | POA: Diagnosis not present

## 2022-03-27 DIAGNOSIS — I10 Essential (primary) hypertension: Secondary | ICD-10-CM | POA: Diagnosis not present

## 2022-03-27 NOTE — Telephone Encounter (Signed)
Telephone encounter was:  Successful.  03/27/2022 Name: Charlotte [name] MRN: [id number] DOB: 1936/07/26  Andres Shad is a 86 y.o. year old female who is a primary care patient of Metheney, Barbarann Ehlers, MD . The community resource team was consulted for assistance with  Mom's Meals.  Care guide performed the following interventions: Received email message from Barth Kirks at [employer], she has left a message for patient's daughter Charlotte [name] to return her call at home and cell phone numbers.  [name] called back and we set up the benefit.  Follow Up Plan:  No further follow up planned at this time. The patient has been provided with needed resources.  Aristide Waggle, AAS Paralegal, [facility] Care Guide  Embedded Care Coordination [place]  Care Management  [address] [place], [place] [address] ??[email or web address]  ?? [phone]   [email or web address]

## 2022-03-28 DIAGNOSIS — I4821 Permanent atrial fibrillation: Secondary | ICD-10-CM | POA: Diagnosis not present

## 2022-03-29 NOTE — Chronic Care Management (AMB) (Signed)
  Care Management   Outreach Note  03/29/2022 Name: KRISTOL ALMANZAR MRN: [id number] DOB: [birth year]  Referred by: Agapito Games, MD Reason for referral : Care Coordination (Initial outreach to schedule referral with [facility] )   A second unsuccessful telephone outreach was attempted today. The patient was referred to the case management team for assistance with care management and care coordination.   Follow Up Plan:  A HIPAA compliant phone message was left for the patient providing contact information and requesting a return call.   Burman Nieves, CCMA Care Guide, Embedded Care Coordination [facility]  Care Management  Direct Dial: [phone]

## 2022-03-30 DIAGNOSIS — I110 Hypertensive heart disease with heart failure: Secondary | ICD-10-CM | POA: Diagnosis not present

## 2022-03-30 DIAGNOSIS — M47816 Spondylosis without myelopathy or radiculopathy, lumbar region: Secondary | ICD-10-CM | POA: Diagnosis not present

## 2022-03-30 DIAGNOSIS — I5042 Chronic combined systolic (congestive) and diastolic (congestive) heart failure: Secondary | ICD-10-CM | POA: Diagnosis not present

## 2022-03-30 DIAGNOSIS — G8929 Other chronic pain: Secondary | ICD-10-CM | POA: Diagnosis not present

## 2022-03-30 DIAGNOSIS — D649 Anemia, unspecified: Secondary | ICD-10-CM | POA: Diagnosis not present

## 2022-03-30 DIAGNOSIS — I480 Paroxysmal atrial fibrillation: Secondary | ICD-10-CM | POA: Diagnosis not present

## 2022-04-01 ENCOUNTER — Encounter: Payer: Self-pay | Admitting: Cardiology

## 2022-04-02 MED ORDER — SPIRONOLACTONE 25 MG PO TABS: 12.5000 mg | ORAL_TABLET | Freq: Every day | ORAL | 3 refills | Status: DC

## 2022-04-02 MED ORDER — SACUBITRIL-VALSARTAN 24-26 MG PO TABS: 1.0000 | ORAL_TABLET | Freq: Two times a day (BID) | ORAL | 11 refills | Status: DC

## 2022-04-02 NOTE — Telephone Encounter (Signed)
Form completed and placed in Charlotte [name] box 

## 2022-04-03 ENCOUNTER — Encounter: Payer: Self-pay | Admitting: Family Medicine

## 2022-04-03 DIAGNOSIS — I5042 Chronic combined systolic (congestive) and diastolic (congestive) heart failure: Secondary | ICD-10-CM | POA: Diagnosis not present

## 2022-04-03 DIAGNOSIS — I480 Paroxysmal atrial fibrillation: Secondary | ICD-10-CM | POA: Diagnosis not present

## 2022-04-03 DIAGNOSIS — D649 Anemia, unspecified: Secondary | ICD-10-CM | POA: Diagnosis not present

## 2022-04-03 DIAGNOSIS — I110 Hypertensive heart disease with heart failure: Secondary | ICD-10-CM | POA: Diagnosis not present

## 2022-04-03 DIAGNOSIS — M47816 Spondylosis without myelopathy or radiculopathy, lumbar region: Secondary | ICD-10-CM | POA: Diagnosis not present

## 2022-04-03 DIAGNOSIS — G8929 Other chronic pain: Secondary | ICD-10-CM | POA: Diagnosis not present

## 2022-04-03 DIAGNOSIS — G4731 Primary central sleep apnea: Secondary | ICD-10-CM | POA: Diagnosis not present

## 2022-04-03 MED ORDER — METHOCARBAMOL 500 MG PO TABS: 500.0000 mg | ORAL_TABLET | Freq: Three times a day (TID) | ORAL | 1 refills | Status: DC | PRN

## 2022-04-03 MED ORDER — METHOCARBAMOL 500 MG PO TABS
500.0000 mg | ORAL_TABLET | Freq: Three times a day (TID) | ORAL | 1 refills | Status: DC | PRN
Start: 2022-04-03 — End: 2022-04-03

## 2022-04-04 ENCOUNTER — Other Ambulatory Visit: Payer: Self-pay | Admitting: Cardiology

## 2022-04-05 ENCOUNTER — Telehealth: Payer: Self-pay | Admitting: *Deleted

## 2022-04-05 NOTE — Telephone Encounter (Signed)
FMLA Form for pt's daughter completed,faxed,confirmation received and scanned into patient's chart.

## 2022-04-13 DIAGNOSIS — I110 Hypertensive heart disease with heart failure: Secondary | ICD-10-CM | POA: Diagnosis not present

## 2022-04-13 DIAGNOSIS — D649 Anemia, unspecified: Secondary | ICD-10-CM | POA: Diagnosis not present

## 2022-04-13 DIAGNOSIS — M47816 Spondylosis without myelopathy or radiculopathy, lumbar region: Secondary | ICD-10-CM | POA: Diagnosis not present

## 2022-04-13 DIAGNOSIS — I5042 Chronic combined systolic (congestive) and diastolic (congestive) heart failure: Secondary | ICD-10-CM | POA: Diagnosis not present

## 2022-04-13 DIAGNOSIS — I480 Paroxysmal atrial fibrillation: Secondary | ICD-10-CM | POA: Diagnosis not present

## 2022-04-13 DIAGNOSIS — G8929 Other chronic pain: Secondary | ICD-10-CM | POA: Diagnosis not present

## 2022-04-14 ENCOUNTER — Other Ambulatory Visit: Payer: Self-pay | Admitting: Family Medicine

## 2022-04-14 DIAGNOSIS — K279 Peptic ulcer, site unspecified, unspecified as acute or chronic, without hemorrhage or perforation: Secondary | ICD-10-CM

## 2022-04-16 DIAGNOSIS — D649 Anemia, unspecified: Secondary | ICD-10-CM | POA: Diagnosis not present

## 2022-04-16 DIAGNOSIS — I5042 Chronic combined systolic (congestive) and diastolic (congestive) heart failure: Secondary | ICD-10-CM | POA: Diagnosis not present

## 2022-04-16 DIAGNOSIS — I480 Paroxysmal atrial fibrillation: Secondary | ICD-10-CM | POA: Diagnosis not present

## 2022-04-16 DIAGNOSIS — M47816 Spondylosis without myelopathy or radiculopathy, lumbar region: Secondary | ICD-10-CM | POA: Diagnosis not present

## 2022-04-16 DIAGNOSIS — I110 Hypertensive heart disease with heart failure: Secondary | ICD-10-CM | POA: Diagnosis not present

## 2022-04-16 DIAGNOSIS — G8929 Other chronic pain: Secondary | ICD-10-CM | POA: Diagnosis not present

## 2022-04-17 ENCOUNTER — Inpatient Hospital Stay: Payer: Medicare Other | Admitting: Family Medicine

## 2022-04-19 NOTE — Chronic Care Management (AMB) (Signed)
  Care Management   Outreach Note  04/19/2022 Name: Charlotte [name] MRN: [id number] DOB: Sep 08, 1936  Referred by: Agapito Games, MD Reason for referral : Care Coordination (Initial outreach to schedule referral with [facility] )   Third unsuccessful telephone outreach was attempted today. The patient was referred to the case management team for assistance with care management and care coordination. The patient's primary care provider has been notified of our unsuccessful attempts to make or maintain contact with the patient. The care management team is pleased to engage with this patient at any time in the future should he/she be interested in assistance from the care management team.   Follow Up Plan:  We have been unable to make contact with the patient for follow up. The care management team is available to follow up with the patient after provider conversation with the patient regarding recommendation for care management engagement and subsequent re-referral to the care management team.   Burman Nieves, [facility] Care Coordination Care Guide Direct Dial: [phone]

## 2022-04-20 DIAGNOSIS — I110 Hypertensive heart disease with heart failure: Secondary | ICD-10-CM | POA: Diagnosis not present

## 2022-04-20 DIAGNOSIS — D649 Anemia, unspecified: Secondary | ICD-10-CM | POA: Diagnosis not present

## 2022-04-20 DIAGNOSIS — I480 Paroxysmal atrial fibrillation: Secondary | ICD-10-CM | POA: Diagnosis not present

## 2022-04-20 DIAGNOSIS — M47816 Spondylosis without myelopathy or radiculopathy, lumbar region: Secondary | ICD-10-CM | POA: Diagnosis not present

## 2022-04-20 DIAGNOSIS — G8929 Other chronic pain: Secondary | ICD-10-CM | POA: Diagnosis not present

## 2022-04-20 DIAGNOSIS — I5042 Chronic combined systolic (congestive) and diastolic (congestive) heart failure: Secondary | ICD-10-CM | POA: Diagnosis not present

## 2022-04-21 DIAGNOSIS — M47816 Spondylosis without myelopathy or radiculopathy, lumbar region: Secondary | ICD-10-CM | POA: Diagnosis not present

## 2022-04-21 DIAGNOSIS — G8929 Other chronic pain: Secondary | ICD-10-CM | POA: Diagnosis not present

## 2022-04-22 ENCOUNTER — Other Ambulatory Visit: Payer: Self-pay | Admitting: Cardiology

## 2022-04-24 ENCOUNTER — Encounter: Payer: Self-pay | Admitting: Nurse Practitioner

## 2022-04-24 ENCOUNTER — Ambulatory Visit: Payer: Medicare Other | Admitting: Nurse Practitioner

## 2022-04-24 VITALS — BP 104/66 | HR 79 | Ht 63.0 in | Wt 275.8 lb

## 2022-04-24 DIAGNOSIS — I5042 Chronic combined systolic (congestive) and diastolic (congestive) heart failure: Secondary | ICD-10-CM | POA: Diagnosis not present

## 2022-04-24 DIAGNOSIS — E785 Hyperlipidemia, unspecified: Secondary | ICD-10-CM

## 2022-04-24 DIAGNOSIS — G4733 Obstructive sleep apnea (adult) (pediatric): Secondary | ICD-10-CM

## 2022-04-24 DIAGNOSIS — Z8673 Personal history of transient ischemic attack (TIA), and cerebral infarction without residual deficits: Secondary | ICD-10-CM

## 2022-04-24 DIAGNOSIS — I10 Essential (primary) hypertension: Secondary | ICD-10-CM

## 2022-04-24 DIAGNOSIS — I4821 Permanent atrial fibrillation: Secondary | ICD-10-CM | POA: Diagnosis not present

## 2022-04-24 DIAGNOSIS — I2510 Atherosclerotic heart disease of native coronary artery without angina pectoris: Secondary | ICD-10-CM

## 2022-04-24 MED ORDER — SACUBITRIL-VALSARTAN 24-26 MG PO TABS: 1.0000 | ORAL_TABLET | Freq: Two times a day (BID) | ORAL | 3 refills | Status: DC

## 2022-04-24 MED ORDER — SPIRONOLACTONE 25 MG PO TABS: 12.5000 mg | ORAL_TABLET | Freq: Every day | ORAL | 3 refills | Status: DC

## 2022-04-24 NOTE — Patient Instructions (Addendum)
Medication Instructions:  Lasix 40 mg twice daily Potassium 20 mEq  twice daily  *If you need a refill on your cardiac medications before your next appointment, please call your pharmacy*   Lab Work: NONE ordered at this time of appointment   If you have labs (blood work) drawn today and your tests are completely normal, you will receive your results only by: MyChart Message (if you have MyChart) OR A paper copy in the mail If you have any lab test that is abnormal or we need to change your treatment, we will call you to review the results.   Testing/Procedures: Your physician has requested that you have an echocardiogram September 2023. Echocardiography is a painless test that uses sound waves to create images of your heart. It provides your doctor with information about the size and shape of your heart and how well your heart's chambers and valves are working. This procedure takes approximately one hour. There are no restrictions for this procedure.    Follow-Up: At [facility], you and your health needs are our priority.  As part of our continuing mission to provide you with exceptional heart care, we have created designated Provider Care Teams.  These Care Teams include your primary Cardiologist (physician) and Advanced Practice Providers (APPs -  Physician Assistants and Nurse Practitioners) who all work together to provide you with the care you need, when you need it.  We recommend signing up for the patient portal called "MyChart".  Sign up information is provided on this After Visit Summary.  MyChart is used to connect with patients for Virtual Visits (Telemedicine).  Patients are able to view lab/test results, encounter notes, upcoming appointments, etc.  Non-urgent messages can be sent to your provider as well.   To learn more about what you can do with MyChart, go to [email or web address].    Your next appointment:   3-4 month(s)  The format for your next appointment:    In Person  Provider:   Dr. Jens Som  or Bernadene Person, NP        Other Instructions   Important Information About Sugar

## 2022-04-24 NOTE — Progress Notes (Signed)
Office Visit    Patient Name: Charlotte [name] Date of Encounter: 04/24/2022  Primary Care Provider:  [name], MD Primary Cardiologist:  Jenkins Rouge, MD/Brian Stanford Breed, MD  Chief Complaint    86 year old female with a history of nonobstructive CAD, permanent atrial fibrillation, chronic combined systolic and diastolic heart failure, aortic valve regurgitation, hypertension, hyperlipidemia, OSA on CPAP, CVA, GERD, GI bleed, and OA who presents for follow-up related to atrial fibrillation and heart failure.  Past Medical History    Past Medical History:  Diagnosis Date   Aortic insufficiency    Atrial fibrillation ([place])    a. s/p DCCV 03/2013   CAD (coronary artery disease)    LHC 06/2010: EF 55%, mild plaque in the LAD 20-30%, otherwise normal coronary arteries   CATARACTS    Chronic systolic heart failure ([place])    in setting of AFib;  Echocardiogram 02/2013: Moderate LVH, EF 25-30%, anteroseptal and apical HK, moderate AI, MAC, moderate MR, moderate LAE, mild RAE, PASP 32 => after DCCV f/u Echo 8/14: Moderate LVH, EF 60-65%, normal wall motion, grade 1 diastolic dysfunction, moderate AI, moderate LAE   Complication of anesthesia 2009   nausea and vomitting   GERD (gastroesophageal reflux disease)    HYPERLIPIDEMIA, MILD    HYPERTENSION, BENIGN SYSTEMIC    Irritable bowel syndrome    LBBB (left bundle branch block)    MEDIAL EPICONDYLITIS    NSVT (nonsustained ventricular tachycardia) ([place])    during Dob Echo 2011 => normal cath   OSTEOARTHRITIS    OSTEOPENIA    PONV (postoperative nausea and vomiting)    Pre-diabetes    RHINITIS, ALLERGIC    Stroke ([place])    SYNCOPE    VITAMIN D DEFICIENCY    Past Surgical History:  Procedure Laterality Date   BIOPSY  03/01/2020   Procedure: BIOPSY;  Surgeon: Otis Brace, MD;  Location: [facility];  Service: Gastroenterology;;   CARDIOVERSION N/A 04/01/2013   Procedure: CARDIOVERSION;  Surgeon: Josue Hector, MD;   Location: [facility] ENDOSCOPY;  Service: Cardiovascular;  Laterality: N/A;   CARDIOVERSION N/A 07/16/2017   Procedure: CARDIOVERSION;  Surgeon: Pixie Casino, MD;  Location: [facility] ENDOSCOPY;  Service: Cardiovascular;  Laterality: N/A;   CARDIOVERSION N/A 07/07/2018   Procedure: CARDIOVERSION;  Surgeon: Pixie Casino, MD;  Location: [place];  Service: Cardiovascular;  Laterality: N/A;   CATARACT EXTRACTION, BILATERAL  2012   Dr. Herbert Deaner   COLONOSCOPY WITH PROPOFOL N/A 05/23/2017   Procedure: COLONOSCOPY WITH PROPOFOL;  Surgeon: Wonda Horner, MD;  Location: [facility] ENDOSCOPY;  Service: Endoscopy;  Laterality: N/A;   ESOPHAGOGASTRODUODENOSCOPY N/A 03/01/2020   Procedure: ESOPHAGOGASTRODUODENOSCOPY (EGD);  Surgeon: Otis Brace, MD;  Location: [facility] ENDOSCOPY;  Service: Gastroenterology;  Laterality: N/A;   ESOPHAGOGASTRODUODENOSCOPY (EGD) WITH PROPOFOL N/A 05/23/2017   Procedure: ESOPHAGOGASTRODUODENOSCOPY (EGD) WITH PROPOFOL;  Surgeon: Wonda Horner, MD;  Location: [facility] ENDOSCOPY;  Service: Endoscopy;  Laterality: N/A;   IR KYPHO LUMBAR INC FX REDUCE BONE BX UNI/BIL CANNULATION INC/IMAGING  06/06/2017   IR RADIOLOGIST EVAL & MGMT  07/01/2017   RADICAL HYSTERECTOMY     REPLACEMENT TOTAL KNEE     11/03/07 right....... 03/29/08 left    Allergies  Allergies  Allergen Reactions   Metformin And Related Other (See Comments)    Renal failure   Amlodipine Other (See Comments)    HEADACHE    History of Present Illness   86 year old female with the above past medical history including nonobstructive CAD, permanent atrial fibrillation,  chronic combined systolic and diastolic heart failure, aortic valve regurgitation, hypertension, hyperlipidemia, OSA on CPAP, CVA, GERD, GI bleed, and OA.  Cardiac catheterization in September 2011 showed mild plaque in the LAD (20 to 30% stenosis).  She has a history of previous cardiomyopathy which improved.  Nuclear study in August 2019 showed EF 40%, fixed anteroseptal  defect felt to be secondary to left bundle branch block, no ischemia.  Echocardiogram in December 2020 showed normal LV function, mild LVH, G1 DD, mild aortic insufficiency.  Additionally, she has a history of atrial fibrillation, s/p DCCV with recurrence, on Xarelto.  She was hospitalized in May 2021 in the setting of GI bleed and syncope.  EGD showed multiple clean-base superficial gastric ulcers. Xarelto was briefly held. Outpatient cardiac monitor in December 2022 showed atrial fibrillation with elevated rate at times.    She was hospitalized in May 2023 in the setting of acute on chronic systolic heart failure, atrial fibrillation with RVR, UTI, delirium, and fall. Echocardiogram in May 2023 showed EF 30%, mild left atrial enlargement, trace aortic insufficiency.  She was last seen in the office on 03/13/2022 and was stable overall from a cardiac standpoint.  Her worsening cardiomyopathy/HFrEF was thought to be possibly tachy-mediated. Repeat echocardiogram was recommended once heart rate controlled and medications titrated.  Repeat cardiac monitor in June 2023 showed continuous atrial fibrillation, average heart rate 90 bpm.   She presents today for follow-up accompanied by her daughter.  Since her last visit she has been stable from a cardiac standpoint.  She denies any worsening dyspnea, edema, weight gain, PND, orthopnea, denies symptoms concerning for angina.  Overall, she reports feeling well denies any new concerns today.  Home Medications    Current Outpatient Medications  Medication Sig Dispense Refill   acetaminophen (TYLENOL) 500 MG tablet Take 1,000 mg by mouth in the morning and at bedtime.     atorvastatin (LIPITOR) 40 MG tablet TAKE 1 TABLET BY MOUTH  DAILY 100 tablet 1   carvedilol (COREG) 12.5 MG tablet Take 1 tablet (12.5 mg total) by mouth 2 (two) times daily with a meal.     DULoxetine (CYMBALTA) 30 MG capsule TAKE 1 CAPSULE BY MOUTH DAILY 90 capsule 3   Ferrous Sulfate (IRON PO)  Take 1 tablet by mouth every other day.     furosemide (LASIX) 20 MG tablet Take 1 tablet (20 mg total) by mouth daily. (Patient taking differently: Take 40 mg by mouth 2 (two) times daily.) 90 tablet 3   methocarbamol (ROBAXIN) 500 MG tablet Take 1 tablet (500 mg total) by mouth every 8 (eight) hours as needed for muscle spasms (or back pain). 90 tablet 1   Multiple Vitamins-Minerals (SYSTANE ICAPS AREDS2) CAPS Take 1 capsule by mouth daily.     nystatin (MYCOSTATIN/NYSTOP) powder Apply 1 application topically 3 (three) times daily. 60 g 3   nystatin cream (MYCOSTATIN) Apply to affected area 2 times daily 60 g 4   omeprazole (PRILOSEC) 40 MG capsule TAKE 1 CAPSULE BY MOUTH TWICE  DAILY IN THE MORNING AND AT  BEDTIME 200 capsule 2   potassium chloride (KLOR-CON) 10 MEQ tablet TAKE 1 TABLET BY MOUTH  TWICE DAILY (Patient taking differently: 20 mEq 2 (two) times daily. TAKE 1 TABLET BY MOUTH  TWICE DAILY) 180 tablet 3   XARELTO 20 MG TABS tablet TAKE 1 TABLET BY MOUTH  DAILY WITH SUPPER 100 tablet 1   sacubitril-valsartan (ENTRESTO) 24-26 MG Take 1 tablet by mouth 2 (two) times  daily. 180 tablet 3   spironolactone (ALDACTONE) 25 MG tablet Take 0.5 tablets (12.5 mg total) by mouth daily. 90 tablet 3   No current facility-administered medications for this visit.     Review of Systems    She denies chest pain, palpitations, dyspnea, pnd, orthopnea, n, v, dizziness, syncope, weight gain, or early satiety. All other systems reviewed and are otherwise negative except as noted above.   Physical Exam    VS:  BP 104/66   Pulse 79   Ht _0  (1.6 m)   Wt 275 lb 12.8 oz (125.1 kg)   SpO2 95%   BMI 48.86 kg/m  GEN: Well nourished, well developed, in no acute distress. HEENT: normal. Neck: Supple, no JVD, carotid bruits, or masses. Cardiac: RRR, no murmurs, rubs, or gallops. No clubbing, cyanosis, non-pitting bilateral lower extremity edema.  Radials/DP/PT 2+ and equal bilaterally.  Respiratory:   Respirations regular and unlabored, clear to auscultation bilaterally. GI: Soft, nontender, nondistended, BS + x 4. MS: no deformity or atrophy. Skin: warm and dry, no rash. Neuro:  Strength and sensation are intact. Psych: Normal affect.  Accessory Clinical Findings    ECG personally reviewed by me today -atrial fibrillation, 79 bpm, LAD, LBBB- no acute changes.  Lab Results  Component Value Date   WBC 7.6 03/01/2022   HGB 14.3 03/01/2022   HCT 45.0 03/01/2022   MCV 93.4 03/01/2022   PLT 223 03/01/2022   Lab Results  Component Value Date   CREATININE 1.23 (H) 03/13/2022   BUN 28 (H) 03/13/2022   NA 138 03/13/2022   K 4.7 03/13/2022   CL 103 03/13/2022   CO2 20 03/13/2022   Lab Results  Component Value Date   ALT 33 02/26/2022   AST 28 02/26/2022   ALKPHOS 107 02/26/2022   BILITOT 1.1 02/26/2022   Lab Results  Component Value Date   CHOL 131 01/31/2021   HDL 47 (L) 01/31/2021   LDLCALC 67 01/31/2021   TRIG 90 01/31/2021   CHOLHDL 2.8 01/31/2021    Lab Results  Component Value Date   HGBA1C 5.7 (A) 01/30/2022    Assessment & Plan    1. Permanent atrial fibrillation: Rate controlled.  Continue carvedilol, Xarelto.  2. Chronic combined systolic and diastolic heart failure: Echo in May 2023 showed EF 30%, mild left atrial enlargement, trace aortic insufficiency. Her worsening cardiomyopathy/HFrEF was thought to be possibly tachy-mediated.  She does have nonpitting bilateral lower extremity edema, however, she states it is much improved.  Otherwise, euvolemic and well compensated on exam. Further escalation of GDMT limited in the setting of soft BP. Additionally, given sedentary lifestyle and obesity, I do not think she would be a good candidate for SGLT2 inhibitor given high risk of infection.  Will plan for repeat echo in September 2023.  Continue carvedilol, Entresto, spironolactone, Lasix.  3. Nonobstructive CAD: Cath in September 2011 showed mild plaque in the  LAD (20 to 30% stenosis).  Normal stress test in 2019.  Stable with no anginal symptoms. No indication for ischemic evaluation.  Continue current medications as above.  4. Aortic valve regurgitation: Most recent echo as above.   5. Hypertension: BP borderline soft.  Asymptomatic.  Continue current antihypertensive regimen.  6. Hyperlipidemia: LDL was 67 in April 2022.  Continue Lipitor.  7. OSA: On CPAP.  Denies any concerns.  8. H/o CVA: On chronic Xarelto.  No recurrence.  9. Disposition: Follow-up in 3-4 months.   Lenna Sciara, NP 04/24/2022,  6:16 PM

## 2022-04-25 DIAGNOSIS — G8929 Other chronic pain: Secondary | ICD-10-CM | POA: Diagnosis not present

## 2022-04-25 DIAGNOSIS — M47816 Spondylosis without myelopathy or radiculopathy, lumbar region: Secondary | ICD-10-CM | POA: Diagnosis not present

## 2022-04-25 DIAGNOSIS — I5042 Chronic combined systolic (congestive) and diastolic (congestive) heart failure: Secondary | ICD-10-CM | POA: Diagnosis not present

## 2022-04-25 DIAGNOSIS — D649 Anemia, unspecified: Secondary | ICD-10-CM | POA: Diagnosis not present

## 2022-04-25 DIAGNOSIS — I480 Paroxysmal atrial fibrillation: Secondary | ICD-10-CM | POA: Diagnosis not present

## 2022-04-25 DIAGNOSIS — I110 Hypertensive heart disease with heart failure: Secondary | ICD-10-CM | POA: Diagnosis not present

## 2022-04-27 ENCOUNTER — Telehealth: Payer: Self-pay | Admitting: Family Medicine

## 2022-04-27 NOTE — Telephone Encounter (Signed)
Fax received from [employer] in regards to [facility] for daughter.  They requested additional information for part C.  This was completed and placed in Tonya B's basket.

## 2022-05-01 ENCOUNTER — Ambulatory Visit (INDEPENDENT_AMBULATORY_CARE_PROVIDER_SITE_OTHER): Payer: Medicare Other | Admitting: Family Medicine

## 2022-05-01 ENCOUNTER — Encounter: Payer: Self-pay | Admitting: Family Medicine

## 2022-05-01 VITALS — BP 102/60 | HR 87 | Ht 63.0 in | Wt 276.0 lb

## 2022-05-01 DIAGNOSIS — I5042 Chronic combined systolic (congestive) and diastolic (congestive) heart failure: Secondary | ICD-10-CM | POA: Diagnosis not present

## 2022-05-01 DIAGNOSIS — L723 Sebaceous cyst: Secondary | ICD-10-CM | POA: Diagnosis not present

## 2022-05-01 DIAGNOSIS — M6740 Ganglion, unspecified site: Secondary | ICD-10-CM

## 2022-05-01 MED ORDER — FUROSEMIDE 40 MG PO TABS: 40.0000 mg | ORAL_TABLET | Freq: Two times a day (BID) | ORAL | 3 refills | Status: DC

## 2022-05-01 MED ORDER — SACUBITRIL-VALSARTAN 24-26 MG PO TABS: 1.0000 | ORAL_TABLET | Freq: Two times a day (BID) | ORAL | 3 refills | Status: DC

## 2022-05-01 MED ORDER — POTASSIUM CHLORIDE ER 20 MEQ PO TBCR: 20.0000 meq | EXTENDED_RELEASE_TABLET | Freq: Two times a day (BID) | ORAL | 3 refills | Status: DC

## 2022-05-01 NOTE — Progress Notes (Unsigned)
Established Patient Office Visit  Subjective   Patient ID: Charlotte [name], female    DOB: 04/26/1936  Age: 86 y.o. MRN: [id number]  Chief Complaint  Patient presents with   Hospitalization Follow-up    UTI,A-fib    HPI Here today for hospital follow-up.  She was admitted May 22 for acute systolic heart failure with A-fib and RVR.  She was discharged on May 30.  She went to the ED complaining of back pain and sudden weakness.  She was brought in by EMS.  She was very volume overloaded.  She had had a drop in her EF down to 30%.  She has followed up with cardiology twice since discharge.  They did check a BMP after discharge as they had increased her Lasix to 40 mg twice a day as well as potassium to 20 mill equivalents twice a day.  She does need updated prescription sent to her mail order she is also on Entresto and seems to be tolerating it well.  She needs that 1 sent to mail order as well.  She feels like her lower extremity swelling is much improved.  She is getting assistance for 5 hours in the morning and 5 hours at night.  She reports that overall her swelling is much improved.  She still not able to Parveen her compression stockings they tend to roll down during the day and she cannot fix them herself.  He does have a cyst or knot on the left anterior wrist that she would like me to look at today as well as 1 on her left scalp.  Its not painful or bothersome.     ROS    Objective:     BP 102/60   Pulse 87   Ht 5\' 3"  (1.6 m)   Wt 276 lb (125.2 kg)   SpO2 96%   BMI 48.89 kg/m    Physical Exam Vitals and nursing note reviewed.  Constitutional:      Appearance: She is well-developed.  HENT:     Head: Normocephalic and atraumatic.  Cardiovascular:     Rate and Rhythm: Normal rate and regular rhythm.     Heart sounds: Normal heart sounds.  Pulmonary:     Effort: Pulmonary effort is normal.     Breath sounds: Normal breath sounds.  Skin:    General: Skin is warm and dry.   Neurological:     Mental Status: She is alert and oriented to person, place, and time.  Psychiatric:        Behavior: Behavior normal.      No results found for any visits on 05/01/22.    The ASCVD Risk score (Arnett DK, et al., 2019) failed to calculate for the following reasons:   The 2019 ASCVD risk score is only valid for ages 60 to 41    Assessment & Plan:   Problem List Items Addressed This Visit       Cardiovascular and Mediastinum   Chronic combined systolic and diastolic congestive heart failure (HCC)    She feels stable on her current regimen she has not noticed any increase in swelling and she has been weighing herself.  Just continue to weigh daily and if weight goes up or down a couple pounds she is welcome to call 76 if she has any concerns or if we need to adjust her diuretic.  Prescription corrected for Lasix and potassium and I did resend her Entresto.  It looks like it was supposed  to be sent but was clicked as no print instead of going to mail order.      Relevant Medications   sacubitril-valsartan (ENTRESTO) 24-26 MG   furosemide (LASIX) 40 MG tablet   Other Visit Diagnoses     Sebaceous cyst    -  Primary   Ganglion cyst           Cyst on the wrist is most consistent with a ganglion cyst and these are benign and not harmful she says it really does not bother her so we will just continue to monitor.  Cyst on the scalp most consistent with a sebaceous cyst.  Also not bothersome so we will just continue to monitor.  Meds ordered this encounter  Medications   potassium chloride 20 MEQ TBCR    Sig: Take 20 mEq by mouth 2 (two) times daily.    Dispense:  180 tablet    Refill:  3    Requesting 1 year supply   sacubitril-valsartan (ENTRESTO) 24-26 MG    Sig: Take 1 tablet by mouth 2 (two) times daily.    Dispense:  180 tablet    Refill:  3   furosemide (LASIX) 40 MG tablet    Sig: Take 1 tablet (40 mg total) by mouth 2 (two) times daily.     Dispense:  180 tablet    Refill:  3    Requesting 1 year supply     Return in about 3 months (around 08/01/2022) for Heart failure.   I spent 35 minutes on the day of the encounter to include pre-visit record review, face-to-face time with the patient and post visit ordering of test.   [name], MD

## 2022-05-02 ENCOUNTER — Ambulatory Visit (INDEPENDENT_AMBULATORY_CARE_PROVIDER_SITE_OTHER): Payer: Medicare Other | Admitting: Family Medicine

## 2022-05-02 ENCOUNTER — Encounter: Payer: Self-pay | Admitting: Family Medicine

## 2022-05-02 DIAGNOSIS — Z0000 Encounter for general adult medical examination without abnormal findings: Secondary | ICD-10-CM | POA: Diagnosis not present

## 2022-05-02 DIAGNOSIS — G8929 Other chronic pain: Secondary | ICD-10-CM | POA: Diagnosis not present

## 2022-05-02 DIAGNOSIS — I5042 Chronic combined systolic (congestive) and diastolic (congestive) heart failure: Secondary | ICD-10-CM | POA: Diagnosis not present

## 2022-05-02 DIAGNOSIS — I480 Paroxysmal atrial fibrillation: Secondary | ICD-10-CM | POA: Diagnosis not present

## 2022-05-02 DIAGNOSIS — I110 Hypertensive heart disease with heart failure: Secondary | ICD-10-CM | POA: Diagnosis not present

## 2022-05-02 DIAGNOSIS — M47816 Spondylosis without myelopathy or radiculopathy, lumbar region: Secondary | ICD-10-CM | POA: Diagnosis not present

## 2022-05-02 DIAGNOSIS — D649 Anemia, unspecified: Secondary | ICD-10-CM | POA: Diagnosis not present

## 2022-05-02 NOTE — Progress Notes (Signed)
MEDICARE ANNUAL WELLNESS VISIT  05/02/2022  Telephone Visit Disclaimer This Medicare AWV was conducted by telephone due to national recommendations for restrictions regarding the COVID-19 Pandemic (e.g. social distancing).  I verified, using two identifiers, that I am speaking with Charlotte [name] or their authorized healthcare agent. I discussed the limitations, risks, security, and privacy concerns of performing an evaluation and management service by telephone and the potential availability of an in-person appointment in the future. The patient expressed understanding and agreed to proceed.  Location of Patient: Home Location of Provider (nurse):  In the office.  Subjective:    Charlotte [name] is a 86 y.o. female patient of Metheney, Rene Kocher, MD who had a Medicare Annual Wellness Visit today via telephone. Charlotte [name] is Retired and lives alone. she has 1 child. she reports that she is socially active and does interact with friends/family regularly. she is minimally physically active and enjoys readingcrocheting and knitting.  Patient Care Team: [name], MD as PCP - General Josue Hector, MD as PCP - Cardiology (Cardiology) Leonia Corona, MD as Referring Physician (Ophthalmology)     05/02/2022   11:05 AM 02/27/2022   12:00 PM 02/17/2021    2:39 PM 03/30/2020    8:56 PM 02/29/2020   10:17 PM 02/29/2020    2:34 PM 01/03/2019   11:22 AM  Advanced Directives  Does Patient Have a Medical Advance Directive? Yes Yes Yes No No No Yes  Type of Advance Directive Living will [place];Living will Living will;Healthcare Power of [place]  Does patient want to make changes to medical advance directive? No - Patient declined No - Patient declined No - Patient declined      Copy of [place] in Chart?   No - copy requested      Would patient like information on creating a medical advance directive?     No - Patient  declined  No - Patient declined    Hospital Utilization Over the Past 12 Months: # of hospitalizations or ER visits: 3 # of surgeries: 0  Review of Systems    Patient reports that her overall health is worse compared to last year.  History obtained from chart review and the patient  Patient Reported Readings (BP, Pulse, CBG, Weight, etc) none  Pain Assessment Pain : No/denies pain     Current Medications & Allergies (verified) Allergies as of 05/02/2022       Reactions   Metformin And Related Other (See Comments)   Renal failure   Amlodipine Other (See Comments)   HEADACHE        Medication List        Accurate as of May 02, 2022 11:11 AM. If you have any questions, ask your nurse or doctor.          acetaminophen 500 MG tablet Commonly known as: TYLENOL Take 1,000 mg by mouth in the morning and at bedtime.   atorvastatin 40 MG tablet Commonly known as: LIPITOR TAKE 1 TABLET BY MOUTH  DAILY   carvedilol 12.5 MG tablet Commonly known as: COREG Take 1 tablet (12.5 mg total) by mouth 2 (two) times daily with a meal.   DULoxetine 30 MG capsule Commonly known as: CYMBALTA TAKE 1 CAPSULE BY MOUTH DAILY   furosemide 40 MG tablet Commonly known as: LASIX Take 1 tablet (40 mg total) by mouth 2 (two) times daily.   IRON PO Take  1 tablet by mouth every other day.   methocarbamol 500 MG tablet Commonly known as: ROBAXIN Take 1 tablet (500 mg total) by mouth every 8 (eight) hours as needed for muscle spasms (or back pain).   nystatin powder Commonly known as: MYCOSTATIN/NYSTOP Apply 1 application topically 3 (three) times daily.   nystatin cream Commonly known as: MYCOSTATIN Apply to affected area 2 times daily   omeprazole 40 MG capsule Commonly known as: PRILOSEC TAKE 1 CAPSULE BY MOUTH TWICE  DAILY IN THE MORNING AND AT  BEDTIME   Potassium Chloride ER 20 MEQ Tbcr Take 20 mEq by mouth 2 (two) times daily.   sacubitril-valsartan 24-26  MG Commonly known as: ENTRESTO Take 1 tablet by mouth 2 (two) times daily.   spironolactone 25 MG tablet Commonly known as: ALDACTONE Take 0.5 tablets (12.5 mg total) by mouth daily.   Systane ICaps AREDS2 Caps Take 1 capsule by mouth daily.   Xarelto 20 MG Tabs tablet Generic drug: rivaroxaban TAKE 1 TABLET BY MOUTH  DAILY WITH SUPPER        History (reviewed): Past Medical History:  Diagnosis Date   Aortic insufficiency    Atrial fibrillation ([place])    a. s/p DCCV 03/2013   CAD (coronary artery disease)    LHC 06/2010: EF 55%, mild plaque in the LAD 20-30%, otherwise normal coronary arteries   CATARACTS    Chronic systolic heart failure ([place])    in setting of AFib;  Echocardiogram 02/2013: Moderate LVH, EF 25-30%, anteroseptal and apical HK, moderate AI, MAC, moderate MR, moderate LAE, mild RAE, PASP 32 => after DCCV f/u Echo 8/14: Moderate LVH, EF 60-65%, normal wall motion, grade 1 diastolic dysfunction, moderate AI, moderate LAE   Complication of anesthesia 2009   nausea and vomitting   GERD (gastroesophageal reflux disease)    HYPERLIPIDEMIA, MILD    HYPERTENSION, BENIGN SYSTEMIC    Irritable bowel syndrome    LBBB (left bundle branch block)    MEDIAL EPICONDYLITIS    NSVT (nonsustained ventricular tachycardia) ([place])    during Dob Echo 2011 => normal cath   OSTEOARTHRITIS    OSTEOPENIA    PONV (postoperative nausea and vomiting)    Pre-diabetes    RHINITIS, ALLERGIC    Stroke ([place])    SYNCOPE    VITAMIN D DEFICIENCY    Past Surgical History:  Procedure Laterality Date   BIOPSY  03/01/2020   Procedure: BIOPSY;  Surgeon: Otis Brace, MD;  Location: [place];  Service: Gastroenterology;;   CARDIOVERSION N/A 04/01/2013   Procedure: CARDIOVERSION;  Surgeon: Josue Hector, MD;  Location: [facility] ENDOSCOPY;  Service: Cardiovascular;  Laterality: N/A;   CARDIOVERSION N/A 07/16/2017   Procedure: CARDIOVERSION;  Surgeon: Pixie Casino, MD;  Location: [facility]  ENDOSCOPY;  Service: Cardiovascular;  Laterality: N/A;   CARDIOVERSION N/A 07/07/2018   Procedure: CARDIOVERSION;  Surgeon: Pixie Casino, MD;  Location: [place];  Service: Cardiovascular;  Laterality: N/A;   CATARACT EXTRACTION, BILATERAL  2012   Dr. Herbert Deaner   COLONOSCOPY WITH PROPOFOL N/A 05/23/2017   Procedure: COLONOSCOPY WITH PROPOFOL;  Surgeon: Wonda Horner, MD;  Location: [facility] ENDOSCOPY;  Service: Endoscopy;  Laterality: N/A;   ESOPHAGOGASTRODUODENOSCOPY N/A 03/01/2020   Procedure: ESOPHAGOGASTRODUODENOSCOPY (EGD);  Surgeon: Otis Brace, MD;  Location: [facility] ENDOSCOPY;  Service: Gastroenterology;  Laterality: N/A;   ESOPHAGOGASTRODUODENOSCOPY (EGD) WITH PROPOFOL N/A 05/23/2017   Procedure: ESOPHAGOGASTRODUODENOSCOPY (EGD) WITH PROPOFOL;  Surgeon: Wonda Horner, MD;  Location: [facility] ENDOSCOPY;  Service: Endoscopy;  Laterality: N/A;   IR KYPHO LUMBAR INC FX REDUCE BONE BX UNI/BIL CANNULATION INC/IMAGING  06/06/2017   IR RADIOLOGIST EVAL & MGMT  07/01/2017   RADICAL HYSTERECTOMY     REPLACEMENT TOTAL KNEE     11/03/07 right....... 03/29/08 left   Family History  Problem Relation Age of Onset   Arrhythmia Mother        afib   Alzheimer's disease Mother    Stroke Mother    Colon cancer Father    Heart attack Father 2   Diabetes Sister    Arrhythmia Sister        afib   Stroke Maternal Grandmother    Social History   Socioeconomic History   Marital status: Divorced    Spouse name: Not on file   Number of children: 1   Years of education: 12   Highest education level: High school graduate  Occupational History   Occupation: Retired    Fish farm manager: RETIRED  Tobacco Use   Smoking status: Former    Types: Cigarettes    Quit date: 10/08/1970    Years since quitting: 51.6   Smokeless tobacco: Never  Vaping Use   Vaping Use: Never used  Substance and Sexual Activity   Alcohol use: No   Drug use: No   Sexual activity: Not on file  Other Topics Concern   Not on file  Social  History Narrative   Lives by herself at home, next to her daughter. She enjoys reading, crocheting and knitting.   Social Determinants of Health   Financial Resource Strain: Low Risk  (05/02/2022)   Overall Financial Resource Strain (CARDIA)    Difficulty of Paying Living Expenses: Not hard at all  Food Insecurity: No Food Insecurity (05/02/2022)   Hunger Vital Sign    Worried About Running Out of Food in the Last Year: Never true    Ran Out of Food in the Last Year: Never true  Recent Concern: Food Insecurity - Food Insecurity Present (03/21/2022)   Hunger Vital Sign    Worried About Running Out of Food in the Last Year: Sometimes true    Ran Out of Food in the Last Year: Sometimes true  Transportation Needs: No Transportation Needs (05/02/2022)   PRAPARE - Hydrologist (Medical): No    Lack of Transportation (Non-Medical): No  Physical Activity: Inactive (05/02/2022)   Exercise Vital Sign    Days of Exercise per Week: 0 days    Minutes of Exercise per Session: 0 min  Stress: No Stress Concern Present (05/02/2022)   [place]    Feeling of Stress : Not at all  Social Connections: Socially Isolated (05/02/2022)   Social Connection and Isolation Panel [NHANES]    Frequency of Communication with Friends and Family: More than three times a week    Frequency of Social Gatherings with Friends and Family: More than three times a week    Attends Religious Services: Never    Marine scientist or Organizations: No    Attends Archivist Meetings: Never    Marital Status: Divorced    Activities of Daily Living    05/02/2022   11:09 AM 02/27/2022   12:00 PM  In your present state of health, do you have any difficulty performing the following activities:  Hearing? 0 0  Vision? 0 1  Difficulty concentrating or making decisions? 1 0  Comment sometime has difficulty remembering.  Walking or climbing stairs? 1 1  Comment walking and difficulty going on stairs.   Dressing or bathing? 0 1  Comment she states it usually just takes longer   Doing errands, shopping? 1 1  Comment her daughter usually takes her.   Preparing Food and eating ? Y   Comment she said somebody usually does that.   Using the Toilet? Y   Comment uses a walker to go to the toilet.   In the past six months, have you accidently leaked urine? Y   Comment sometimes   Do you have problems with loss of bowel control? Y   Comment sometimes   Managing your Medications? N   Managing your Finances? Y   Comment her daughter helps with that.   Housekeeping or managing your Housekeeping? Y   Comment her daughter helps with that.     Patient Education/ Literacy How often do you need to have someone help you when you read instructions, pamphlets, or other written materials from your doctor or pharmacy?: 1 - Never What is the last grade level you completed in school?: 12th grade  Exercise Current Exercise Habits: The patient does not participate in regular exercise at present, Exercise limited by: None identified  Diet Patient reports consuming 3 meals a day and 1 snack(s) a day Patient reports that her primary diet is: Regular Patient reports that she does have regular access to food.   Depression Screen    05/02/2022   11:06 AM 11/14/2021    3:05 PM 10/31/2021    3:36 PM 02/17/2021    2:37 PM 01/31/2021    2:53 PM 09/15/2020    4:10 PM 04/12/2020    4:29 PM  PHQ 2/9 Scores  PHQ - 2 Score 0 0 0 0 1 4 0  PHQ- 9 Score      9      Fall Risk    05/02/2022   11:05 AM 11/14/2021    3:04 PM 10/31/2021    3:35 PM 02/17/2021    2:37 PM 05/12/2020    2:56 PM  [place] in the past year? 1 1 0 0 0  Number falls in past yr: 1 0 0 0   Injury with Fall? 1 0 0 0   Risk for fall due to : Impaired balance/gait;History of fall(s) Impaired balance/gait;Impaired mobility Impaired mobility No Fall Risks    Follow up Falls evaluation completed;Education provided;Falls prevention discussed Falls prevention discussed;Falls evaluation completed Falls evaluation completed Falls evaluation completed      Objective:  Charlotte [name] seemed alert and oriented and she participated appropriately during our telephone visit.  Blood Pressure Weight BMI  BP Readings from Last 3 Encounters:  05/01/22 102/60  04/24/22 104/66  03/13/22 118/68   Wt Readings from Last 3 Encounters:  05/01/22 276 lb (125.2 kg)  04/24/22 275 lb 12.8 oz (125.1 kg)  03/13/22 281 lb (127.5 kg)   BMI Readings from Last 1 Encounters:  05/01/22 48.89 kg/m    *Unable to obtain current vital signs, weight, and BMI due to telephone visit type  Hearing/Vision  Charlotte [name] did not seem to have difficulty with hearing/understanding during the telephone conversation Reports that she has had a formal eye exam by an eye care professional within the past year Reports that she has not had a formal hearing evaluation within the past year *Unable to fully assess hearing and vision during telephone visit type  Cognitive Function:    02/17/2021  2:44 PM  6CIT Screen  What Year? 0 points  What month? 0 points  What time? 0 points  Count back from 20 0 points  Months in reverse 0 points  Repeat phrase 0 points  Total Score 0 points   (Normal:0-7, Significant for Dysfunction: >8)  Normal Cognitive Function Screening: Yes   Immunization & Health Maintenance Record Immunization History  Administered Date(s) Administered   Fluad Quad(high Dose 65+) 06/16/2020, 06/06/2021   Influenza Whole 08/07/2006, 08/05/2007, 07/08/2009, 07/25/2009, 06/30/2010   Influenza, High Dose Seasonal PF 07/24/2016, 07/04/2017, 07/13/2019   Influenza,inj,Quad PF,6+ Mos 06/11/2013, 06/18/2014, 06/20/2015   Influenza-Unspecified 08/11/2018   Moderna Sars-Covid-2 Vaccination 12/06/2019, 01/09/2020   PFIZER(Purple Top)SARS-COV-2 Vaccination 09/10/2020   Pfizer  Covid-19 Vaccine Bivalent Booster 8yr & up 12/23/2021   Pneumococcal Conjugate-13 07/19/2014   Pneumococcal Polysaccharide-23 10/08/2002, 02/25/2017   Td 07/25/2009   Zoster Recombinat (Shingrix) 10/28/2018, 04/30/2019   Zoster, Live 09/21/2009    Health Maintenance  Topic Date Due   TETANUS/TDAP  07/26/2019   COVID-19 Vaccine (5 - Moderna series) 04/24/2022   OPHTHALMOLOGY EXAM  04/19/2022   DEXA SCAN  10/31/2022 (Originally 07/11/2021)   INFLUENZA VACCINE  05/08/2022   HEMOGLOBIN A1C  08/01/2022   Diabetic kidney evaluation - Urine ACR  01/31/2023   FOOT EXAM  01/31/2023   Diabetic kidney evaluation - GFR measurement  03/14/2023   Pneumonia Vaccine 86 Years old  Completed   Zoster Vaccines- Shingrix  Completed   HPV VACCINES  Aged Out       Assessment  This is a routine wellness examination for Charlotte [name].  Health Maintenance: Due or Overdue Health Maintenance Due  Topic Date Due   TETANUS/TDAP  07/26/2019   COVID-19 Vaccine (5 - Moderna series) 04/24/2022   OPHTHALMOLOGY EXAM  04/19/2022    Charlotte [name] does not need a referral for Community Assistance: Care Management:   no Social Work:    no Prescription Assistance:  no Nutrition/Diabetes Education:  no   Plan:  Personalized Goals  Goals Addressed               This Visit's Progress     Patient Stated (pt-stated)        Patient states she just wants to feel better.       Personalized Health Maintenance & Screening Recommendations  Td vaccine Bone densitometry screening Eye exam- need records  Lung Cancer Screening Recommended: no (Low Dose CT Chest recommended if Age 84-80years, 30 pack-year currently smoking OR have quit w/in past 15 years) Hepatitis C Screening recommended: no HIV Screening recommended: no  Advanced Directives: Written information was not prepared per patient's request.  Referrals & Orders No orders of the defined types were placed in this encounter.   Follow-up  Plan Follow-up with MHali Marry MD as planned Patient would like to bone density scan with PCP at next appointment.  Please have your eye exam results and the tetanus shot date faxed to our office. Medicare wellness visit in one year. AVS printed and mailed to the patient.   I have personally reviewed and noted the following in the patient's chart:   Medical and social history Use of alcohol, tobacco or illicit drugs  Current medications and supplements Functional ability and status Nutritional status Physical activity Advanced directives List of other physicians Hospitalizations, surgeries, and ER visits in previous 12 months Vitals Screenings to include cognitive, depression, and falls Referrals and appointments  In addition, I have reviewed and  discussed with Charlotte [name] certain preventive protocols, quality metrics, and best practice recommendations. A written personalized care plan for preventive services as well as general preventive health recommendations is available and can be mailed to the patient at her request.      [name], RN BSN  05/02/2022

## 2022-05-02 NOTE — Assessment & Plan Note (Addendum)
She feels stable on her current regimen she has not noticed any increase in swelling and she has been weighing herself.  Just continue to weigh daily and if weight goes up or down a couple pounds she is welcome to call us if she has any concerns or if we need to adjust her diuretic.  Prescription corrected for Lasix and potassium and I did resend her Entresto.  It looks like it was supposed to be sent but was clicked as no print instead of going to mail order.

## 2022-05-02 NOTE — Patient Instructions (Addendum)
MEDICARE ANNUAL WELLNESS VISIT Health Maintenance Summary and Written Plan of Care  [name] ,  Thank you for allowing me to perform your Medicare Annual Wellness Visit and for your ongoing commitment to your health.   Health Maintenance & Immunization History Health Maintenance  Topic Date Due  . OPHTHALMOLOGY EXAM  05/02/2022 (Originally 04/19/2022)  . COVID-19 Vaccine (5 - Moderna series) 05/18/2022 (Originally 04/24/2022)  . DEXA SCAN  10/31/2022 (Originally 07/11/2021)  . TETANUS/TDAP  05/03/2023 (Originally 07/26/2019)  . INFLUENZA VACCINE  05/08/2022  . HEMOGLOBIN A1C  08/01/2022  . Diabetic kidney evaluation - Urine ACR  01/31/2023  . FOOT EXAM  01/31/2023  . Diabetic kidney evaluation - GFR measurement  03/14/2023  . Pneumonia Vaccine 19+ Years old  Completed  . Zoster Vaccines- Shingrix  Completed  . HPV VACCINES  Aged Out   Immunization History  Administered Date(s) Administered  . Fluad Quad(high Dose 65+) 06/16/2020, 06/06/2021  . Influenza Whole 08/07/2006, 08/05/2007, 07/08/2009, 07/25/2009, 06/30/2010  . Influenza, High Dose Seasonal PF 07/24/2016, 07/04/2017, 07/13/2019  . Influenza,inj,Quad PF,6+ Mos 06/11/2013, 06/18/2014, 06/20/2015  . Influenza-Unspecified 08/11/2018  . Moderna Sars-Covid-2 Vaccination 12/06/2019, 01/09/2020  . PFIZER(Purple Top)SARS-COV-2 Vaccination 09/10/2020  . [employer], [employer] 31yrs & up 12/23/2021  . Pneumococcal Conjugate-13 07/19/2014  . Pneumococcal Polysaccharide-23 10/08/2002, 02/25/2017  . Td 07/25/2009  . Zoster Recombinat (Shingrix) 10/28/2018, 04/30/2019  . Zoster, Live 09/21/2009    These are the patient goals that we discussed:  Goals Addressed              This Visit's Progress   .  Patient Stated (pt-stated)        Patient states she just wants to feel better.        This is a list of Health Maintenance Items that are overdue or due now: Td vaccine Bone densitometry screening Eye  exam-need records   Orders/Referrals Placed Today: No orders of the defined types were placed in this encounter.  (Contact our referral department at [phone] if you have not spoken with someone about your referral appointment within the next 5 days)    Follow-up Plan Follow-up with Agapito Games, MD as planned Patient would like to bone density scan with PCP at next appointment.  Please have your eye exam results and the tetanus shot date faxed to our office. Medicare wellness visit in one year. AVS printed and mailed to the patient.      Health Maintenance, Female Adopting a healthy lifestyle and getting preventive care are important in promoting health and wellness. Ask your health care provider about: The right schedule for you to have regular tests and exams. Things you can do on your own to prevent diseases and keep yourself healthy. What should I know about diet, weight, and exercise? Eat a healthy diet  Eat a diet that includes plenty of vegetables, fruits, low-fat dairy products, and lean protein. Do not eat a lot of foods that are high in solid fats, added sugars, or sodium. Maintain a healthy weight Body mass index (BMI) is used to identify weight problems. It estimates body fat based on height and weight. Your health care provider can help determine your BMI and help you achieve or maintain a healthy weight. Get regular exercise Get regular exercise. This is one of the most important things you can do for your health. Most adults should: Exercise for at least 150 minutes each week. The exercise should increase your heart rate and make you  sweat (moderate-intensity exercise). Do strengthening exercises at least twice a week. This is in addition to the moderate-intensity exercise. Spend less time sitting. Even light physical activity can be beneficial. Watch cholesterol and blood lipids Have your blood tested for lipids and cholesterol at 84 years of age, then  have this test every 5 years. Have your cholesterol levels checked more often if: Your lipid or cholesterol levels are high. You are older than 84 years of age. You are at high risk for heart disease. What should I know about cancer screening? Depending on your health history and family history, you may need to have cancer screening at various ages. This may include screening for: Breast cancer. Cervical cancer. Colorectal cancer. Skin cancer. Lung cancer. What should I know about heart disease, diabetes, and high blood pressure? Blood pressure and heart disease High blood pressure causes heart disease and increases the risk of stroke. This is more likely to develop in people who have high blood pressure readings or are overweight. Have your blood pressure checked: Every 3-5 years if you are 70-42 years of age. Every year if you are 70 years old or older. Diabetes Have regular diabetes screenings. This checks your fasting blood sugar level. Have the screening done: Once every three years after age 40 if you are at a normal weight and have a low risk for diabetes. More often and at a younger age if you are overweight or have a high risk for diabetes. What should I know about preventing infection? Hepatitis B If you have a higher risk for hepatitis B, you should be screened for this virus. Talk with your health care provider to find out if you are at risk for hepatitis B infection. Hepatitis C Testing is recommended for: Everyone born from 1 through 1963. Anyone with known risk factors for hepatitis C. Sexually transmitted infections (STIs) Get screened for STIs, including gonorrhea and chlamydia, if: You are sexually active and are younger than 84 years of age. You are older than 84 years of age and your health care provider tells you that you are at risk for this type of infection. Your sexual activity has changed since you were last screened, and you are at increased risk for  chlamydia or gonorrhea. Ask your health care provider if you are at risk. Ask your health care provider about whether you are at high risk for HIV. Your health care provider may recommend a prescription medicine to help prevent HIV infection. If you choose to take medicine to prevent HIV, you should first get tested for HIV. You should then be tested every 3 months for as long as you are taking the medicine. Pregnancy If you are about to stop having your period (premenopausal) and you may become pregnant, seek counseling before you get pregnant. Take 400 to 800 micrograms (mcg) of folic acid every day if you become pregnant. Ask for birth control (contraception) if you want to prevent pregnancy. Osteoporosis and menopause Osteoporosis is a disease in which the bones lose minerals and strength with aging. This can result in bone fractures. If you are 30 years old or older, or if you are at risk for osteoporosis and fractures, ask your health care provider if you should: Be screened for bone loss. Take a calcium or vitamin D supplement to lower your risk of fractures. Be given hormone replacement therapy (HRT) to treat symptoms of menopause. Follow these instructions at home: Alcohol use Do not drink alcohol if: Your health care provider  tells you not to drink. You are pregnant, may be pregnant, or are planning to become pregnant. If you drink alcohol: Limit how much you have to: 0-1 drink a day. Know how much alcohol is in your drink. In the [place] one drink equals one 12 oz bottle of beer (355 mL), one 5 oz glass of wine (148 mL), or one 1 oz glass of hard liquor (44 mL). Lifestyle Do not use any products that contain nicotine or tobacco. These products include cigarettes, chewing tobacco, and vaping devices, such as e-cigarettes. If you need help quitting, ask your health care provider. Do not use street drugs. Do not share needles. Ask your health care provider for help if you need support  or information about quitting drugs. General instructions Schedule regular health, dental, and eye exams. Stay current with your vaccines. Tell your health care provider if: You often feel depressed. You have ever been abused or do not feel safe at home. Summary Adopting a healthy lifestyle and getting preventive care are important in promoting health and wellness. Follow your health care provider's instructions about healthy diet, exercising, and getting tested or screened for diseases. Follow your health care provider's instructions on monitoring your cholesterol and blood pressure. This information is not intended to replace advice given to you by your health care provider. Make sure you discuss any questions you have with your health care provider. Document Revised: 02/13/2021 Document Reviewed: 02/13/2021 Elsevier Patient Education  2023 [employer].

## 2022-05-03 NOTE — Telephone Encounter (Signed)
Forms were faxed and confirmation received.

## 2022-05-07 ENCOUNTER — Encounter: Payer: Self-pay | Admitting: Cardiology

## 2022-05-08 DIAGNOSIS — I480 Paroxysmal atrial fibrillation: Secondary | ICD-10-CM | POA: Diagnosis not present

## 2022-05-08 DIAGNOSIS — G8929 Other chronic pain: Secondary | ICD-10-CM | POA: Diagnosis not present

## 2022-05-08 DIAGNOSIS — I110 Hypertensive heart disease with heart failure: Secondary | ICD-10-CM | POA: Diagnosis not present

## 2022-05-08 DIAGNOSIS — D649 Anemia, unspecified: Secondary | ICD-10-CM | POA: Diagnosis not present

## 2022-05-08 DIAGNOSIS — I5042 Chronic combined systolic (congestive) and diastolic (congestive) heart failure: Secondary | ICD-10-CM | POA: Diagnosis not present

## 2022-05-08 DIAGNOSIS — M47816 Spondylosis without myelopathy or radiculopathy, lumbar region: Secondary | ICD-10-CM | POA: Diagnosis not present

## 2022-05-15 DIAGNOSIS — G8929 Other chronic pain: Secondary | ICD-10-CM | POA: Diagnosis not present

## 2022-05-15 DIAGNOSIS — D649 Anemia, unspecified: Secondary | ICD-10-CM | POA: Diagnosis not present

## 2022-05-15 DIAGNOSIS — I480 Paroxysmal atrial fibrillation: Secondary | ICD-10-CM | POA: Diagnosis not present

## 2022-05-15 DIAGNOSIS — M47816 Spondylosis without myelopathy or radiculopathy, lumbar region: Secondary | ICD-10-CM | POA: Diagnosis not present

## 2022-05-15 DIAGNOSIS — I5042 Chronic combined systolic (congestive) and diastolic (congestive) heart failure: Secondary | ICD-10-CM | POA: Diagnosis not present

## 2022-05-15 DIAGNOSIS — I110 Hypertensive heart disease with heart failure: Secondary | ICD-10-CM | POA: Diagnosis not present

## 2022-05-22 ENCOUNTER — Telehealth: Payer: Self-pay | Admitting: *Deleted

## 2022-05-22 MED ORDER — SACUBITRIL-VALSARTAN 24-26 MG PO TABS: 1.0000 | ORAL_TABLET | Freq: Two times a day (BID) | ORAL | 3 refills | Status: DC

## 2022-05-22 NOTE — Telephone Encounter (Signed)
[facility] Assistance faxed.

## 2022-06-12 DIAGNOSIS — H524 Presbyopia: Secondary | ICD-10-CM | POA: Diagnosis not present

## 2022-06-12 DIAGNOSIS — Z961 Presence of intraocular lens: Secondary | ICD-10-CM | POA: Diagnosis not present

## 2022-06-12 DIAGNOSIS — H43813 Vitreous degeneration, bilateral: Secondary | ICD-10-CM | POA: Diagnosis not present

## 2022-06-12 DIAGNOSIS — H04123 Dry eye syndrome of bilateral lacrimal glands: Secondary | ICD-10-CM | POA: Diagnosis not present

## 2022-06-12 DIAGNOSIS — H353131 Nonexudative age-related macular degeneration, bilateral, early dry stage: Secondary | ICD-10-CM | POA: Diagnosis not present

## 2022-06-12 DIAGNOSIS — H527 Unspecified disorder of refraction: Secondary | ICD-10-CM | POA: Diagnosis not present

## 2022-06-12 DIAGNOSIS — H02831 Dermatochalasis of right upper eyelid: Secondary | ICD-10-CM | POA: Diagnosis not present

## 2022-06-12 DIAGNOSIS — E119 Type 2 diabetes mellitus without complications: Secondary | ICD-10-CM | POA: Diagnosis not present

## 2022-06-12 DIAGNOSIS — H02834 Dermatochalasis of left upper eyelid: Secondary | ICD-10-CM | POA: Diagnosis not present

## 2022-06-12 LAB — HM DIABETES EYE EXAM

## 2022-06-26 ENCOUNTER — Ambulatory Visit (HOSPITAL_COMMUNITY): Payer: Medicare Other | Attending: Nurse Practitioner

## 2022-06-26 DIAGNOSIS — I5042 Chronic combined systolic (congestive) and diastolic (congestive) heart failure: Secondary | ICD-10-CM | POA: Diagnosis not present

## 2022-06-26 LAB — ECHOCARDIOGRAM COMPLETE
P 1/2 time: 612 msec
S' Lateral: 3.7 cm

## 2022-06-28 ENCOUNTER — Telehealth: Payer: Self-pay

## 2022-06-28 NOTE — Telephone Encounter (Signed)
Lmom, waiting on a return call to discuss echo results.  

## 2022-07-01 ENCOUNTER — Other Ambulatory Visit: Payer: Self-pay | Admitting: Cardiology

## 2022-07-01 DIAGNOSIS — I4819 Other persistent atrial fibrillation: Secondary | ICD-10-CM

## 2022-07-11 NOTE — Telephone Encounter (Signed)
Lmom, waiting on a return call to discuss echo results.  

## 2022-07-17 ENCOUNTER — Ambulatory Visit: Payer: Medicare Other | Attending: Nurse Practitioner | Admitting: Nurse Practitioner

## 2022-07-17 ENCOUNTER — Encounter: Payer: Self-pay | Admitting: Nurse Practitioner

## 2022-07-17 VITALS — BP 110/78 | HR 80 | Ht 63.0 in | Wt 272.0 lb

## 2022-07-17 DIAGNOSIS — I351 Nonrheumatic aortic (valve) insufficiency: Secondary | ICD-10-CM

## 2022-07-17 DIAGNOSIS — I4821 Permanent atrial fibrillation: Secondary | ICD-10-CM

## 2022-07-17 DIAGNOSIS — E785 Hyperlipidemia, unspecified: Secondary | ICD-10-CM | POA: Diagnosis not present

## 2022-07-17 DIAGNOSIS — Z8673 Personal history of transient ischemic attack (TIA), and cerebral infarction without residual deficits: Secondary | ICD-10-CM

## 2022-07-17 DIAGNOSIS — G4733 Obstructive sleep apnea (adult) (pediatric): Secondary | ICD-10-CM | POA: Diagnosis not present

## 2022-07-17 DIAGNOSIS — I10 Essential (primary) hypertension: Secondary | ICD-10-CM

## 2022-07-17 DIAGNOSIS — I5042 Chronic combined systolic (congestive) and diastolic (congestive) heart failure: Secondary | ICD-10-CM | POA: Diagnosis not present

## 2022-07-17 DIAGNOSIS — I2510 Atherosclerotic heart disease of native coronary artery without angina pectoris: Secondary | ICD-10-CM

## 2022-07-17 NOTE — Patient Instructions (Signed)
Medication Instructions:  Your physician recommends that you continue on your current medications as directed. Please refer to the Current Medication list given to you today.   *If you need a refill on your cardiac medications before your next appointment, please call your pharmacy*   Lab Work: NONE ordered at this time of appointment   If you have labs (blood work) drawn today and your tests are completely normal, you will receive your results only by: MyChart Message (if you have MyChart) OR A paper copy in the mail If you have any lab test that is abnormal or we need to change your treatment, we will call you to review the results.   Testing/Procedures: NONE ordered at this time of appointment     Follow-Up: At  [facility], you and your health needs are our priority.  As part of our continuing mission to provide you with exceptional heart care, we have created designated Provider Care Teams.  These Care Teams include your primary Cardiologist (physician) and Advanced Practice Providers (APPs -  Physician Assistants and Nurse Practitioners) who all work together to provide you with the care you need, when you need it.  We recommend signing up for the patient portal called "MyChart".  Sign up information is provided on this After Visit Summary.  MyChart is used to connect with patients for Virtual Visits (Telemedicine).  Patients are able to view lab/test results, encounter notes, upcoming appointments, etc.  Non-urgent messages can be sent to your provider as well.   To learn more about what you can do with MyChart, go to [email or web address].    Your next appointment:   3-4 month(s)  The format for your next appointment:   In Person  Provider:   Brian Crenshaw, MD     Other Instructions   Important Information About Sugar       

## 2022-07-17 NOTE — Progress Notes (Signed)
Office Visit    Patient Name: Charlotte [name] Date of Encounter: 07/17/2022  Primary Care Provider:  [name], MD Primary Cardiologist:  Kirk Ruths, MD  Chief Complaint    86 year old female with a history of nonobstructive CAD, permanent atrial fibrillation, chronic combined systolic and diastolic heart failure, aortic valve regurgitation, hypertension, hyperlipidemia, OSA on CPAP, CVA, GERD, GI bleed, and OA who presents for follow-up related to atrial fibrillation and heart failure.  Past Medical History    Past Medical History:  Diagnosis Date   Aortic insufficiency    Atrial fibrillation ([place])    a. s/p DCCV 03/2013   CAD (coronary artery disease)    LHC 06/2010: EF 55%, mild plaque in the LAD 20-30%, otherwise normal coronary arteries   CATARACTS    Chronic systolic heart failure ([place])    in setting of AFib;  Echocardiogram 02/2013: Moderate LVH, EF 25-30%, anteroseptal and apical HK, moderate AI, MAC, moderate MR, moderate LAE, mild RAE, PASP 32 => after DCCV f/u Echo 8/14: Moderate LVH, EF 60-65%, normal wall motion, grade 1 diastolic dysfunction, moderate AI, moderate LAE   Complication of anesthesia 2009   nausea and vomitting   GERD (gastroesophageal reflux disease)    HYPERLIPIDEMIA, MILD    HYPERTENSION, BENIGN SYSTEMIC    Irritable bowel syndrome    LBBB (left bundle branch block)    MEDIAL EPICONDYLITIS    NSVT (nonsustained ventricular tachycardia) ([place])    during Dob Echo 2011 => normal cath   OSTEOARTHRITIS    OSTEOPENIA    PONV (postoperative nausea and vomiting)    Pre-diabetes    RHINITIS, ALLERGIC    Stroke ([place])    SYNCOPE    VITAMIN D DEFICIENCY    Past Surgical History:  Procedure Laterality Date   BIOPSY  03/01/2020   Procedure: BIOPSY;  Surgeon: Otis Brace, MD;  Location: [place];  Service: Gastroenterology;;   CARDIOVERSION N/A 04/01/2013   Procedure: CARDIOVERSION;  Surgeon: Josue Hector, MD;  Location: [facility]  ENDOSCOPY;  Service: Cardiovascular;  Laterality: N/A;   CARDIOVERSION N/A 07/16/2017   Procedure: CARDIOVERSION;  Surgeon: Pixie Casino, MD;  Location: [facility] ENDOSCOPY;  Service: Cardiovascular;  Laterality: N/A;   CARDIOVERSION N/A 07/07/2018   Procedure: CARDIOVERSION;  Surgeon: Pixie Casino, MD;  Location: [place];  Service: Cardiovascular;  Laterality: N/A;   CATARACT EXTRACTION, BILATERAL  2012   Dr. Herbert Deaner   COLONOSCOPY WITH PROPOFOL N/A 05/23/2017   Procedure: COLONOSCOPY WITH PROPOFOL;  Surgeon: Wonda Horner, MD;  Location: [facility] ENDOSCOPY;  Service: Endoscopy;  Laterality: N/A;   ESOPHAGOGASTRODUODENOSCOPY N/A 03/01/2020   Procedure: ESOPHAGOGASTRODUODENOSCOPY (EGD);  Surgeon: Otis Brace, MD;  Location: [facility] ENDOSCOPY;  Service: Gastroenterology;  Laterality: N/A;   ESOPHAGOGASTRODUODENOSCOPY (EGD) WITH PROPOFOL N/A 05/23/2017   Procedure: ESOPHAGOGASTRODUODENOSCOPY (EGD) WITH PROPOFOL;  Surgeon: Wonda Horner, MD;  Location: [facility] ENDOSCOPY;  Service: Endoscopy;  Laterality: N/A;   IR KYPHO LUMBAR INC FX REDUCE BONE BX UNI/BIL CANNULATION INC/IMAGING  06/06/2017   IR RADIOLOGIST EVAL & MGMT  07/01/2017   RADICAL HYSTERECTOMY     REPLACEMENT TOTAL KNEE     11/03/07 right....... 03/29/08 left    Allergies  Allergies  Allergen Reactions   Metformin And Related Other (See Comments)    Renal failure   Amlodipine Other (See Comments)    HEADACHE    History of Present Illness    86 year old female with the above past medical history including nonobstructive CAD, permanent atrial fibrillation, chronic  combined systolic and diastolic heart failure, aortic valve regurgitation, hypertension, hyperlipidemia, OSA on CPAP, CVA, GERD, GI bleed, and OA.   Cardiac catheterization in September 2011 showed mild plaque in the LAD (20 to 30% stenosis).  She has a history of previous cardiomyopathy which improved.  Nuclear study in August 2019 showed EF 40%, fixed anteroseptal defect felt  to be secondary to left bundle branch block, no ischemia.  Echocardiogram in December 2020 showed normal LV function, mild LVH, G1 DD, mild aortic insufficiency.  Additionally, she has a history of atrial fibrillation, s/p DCCV with recurrence, on Xarelto.  She was hospitalized in May 2021 in the setting of GI bleed and syncope.  EGD showed multiple clean-base superficial gastric ulcers. Xarelto was briefly held. Outpatient cardiac monitor in December 2022 showed atrial fibrillation with elevated rate at times.     She was hospitalized in May 2023 in the setting of acute on chronic systolic heart failure, atrial fibrillation with RVR, UTI, delirium, and fall. Echocardiogram in May 2023 showed EF 30%, mild left atrial enlargement, trace aortic insufficiency.  Her worsening cardiomyopathy/HFrEF was thought to be possibly tachy-mediated. Repeat echocardiogram was recommended once heart rate controlled and medications titrated.  Repeat cardiac monitor in June 2023 showed continuous atrial fibrillation, average heart rate 90 bpm.  She was last seen in the office on 04/24/2022 and was stable from a cardiac standpoint.  Echocardiogram in 06/2022 showed EF 30 to 35%, LV dyssynchrony secondary to LBBB, moderately decreased LV function, LV global hypokinesis, indeterminate diastolic parameters, normal RV systolic function, mild aortic valve regurgitation, unchanged from prior.    She presents today for follow-up accompanied by her daughter. Since her last visit she has been stable from a cardiac standpoint.  Her lower extremity edema has dramatically improved, weight is down. She is watching her sodium intake.  She notes occasional mild dyspnea on exertion, this is stable.  She denies chest pain, palpitations, PND, orthopnea. Overall, she reports feeling well and denies any additional concerns today.  Home Medications    Current Outpatient Medications  Medication Sig Dispense Refill   acetaminophen (TYLENOL) 500 MG  tablet Take 1,000 mg by mouth in the morning and at bedtime.     atorvastatin (LIPITOR) 40 MG tablet TAKE 1 TABLET BY MOUTH  DAILY 100 tablet 1   carvedilol (COREG) 12.5 MG tablet Take 1 tablet (12.5 mg total) by mouth 2 (two) times daily with a meal.     DULoxetine (CYMBALTA) 30 MG capsule TAKE 1 CAPSULE BY MOUTH DAILY 90 capsule 3   Ferrous Sulfate (IRON PO) Take 1 tablet by mouth every other day.     furosemide (LASIX) 40 MG tablet Take 1 tablet (40 mg total) by mouth 2 (two) times daily. 180 tablet 3   methocarbamol (ROBAXIN) 500 MG tablet Take 1 tablet (500 mg total) by mouth every 8 (eight) hours as needed for muscle spasms (or back pain). 90 tablet 1   Multiple Vitamins-Minerals (SYSTANE ICAPS AREDS2) CAPS Take 1 capsule by mouth daily.     nystatin (MYCOSTATIN/NYSTOP) powder Apply 1 application topically 3 (three) times daily. 60 g 3   nystatin cream (MYCOSTATIN) Apply to affected area 2 times daily 60 g 4   omeprazole (PRILOSEC) 40 MG capsule TAKE 1 CAPSULE BY MOUTH TWICE  DAILY IN THE MORNING AND AT  BEDTIME 200 capsule 2   potassium chloride 20 MEQ TBCR Take 20 mEq by mouth 2 (two) times daily. 180 tablet 3   sacubitril-valsartan (ENTRESTO)  24-26 MG Take 1 tablet by mouth 2 (two) times daily. 180 tablet 3   spironolactone (ALDACTONE) 25 MG tablet Take 0.5 tablets (12.5 mg total) by mouth daily. 90 tablet 3   XARELTO 20 MG TABS tablet TAKE 1 TABLET BY MOUTH  DAILY WITH SUPPER 100 tablet 1   No current facility-administered medications for this visit.     Review of Systems    She denies chest pain, palpitations, dyspnea, pnd, orthopnea, n, v, dizziness, syncope, edema, weight gain, or early satiety. All other systems reviewed and are otherwise negative except as noted above.   Physical Exam    VS:  BP 110/78   Pulse 80   Ht _0  (1.6 m)   Wt 272 lb (123.4 kg)   SpO2 98%   BMI 48.18 kg/m  GEN: Well nourished, well developed, in no acute distress. HEENT: normal. Neck:  Supple, no JVD, carotid bruits, or masses. Cardiac: RRR, no murmurs, rubs, or gallops. No clubbing, cyanosis, non-pitting bilateral lower extremity edema.  Radials/DP/PT 2+ and equal bilaterally.  Respiratory:  Respirations regular and unlabored, clear to auscultation bilaterally. GI: Obese, soft, nontender, nondistended, BS + x 4. MS: no deformity or atrophy. Skin: warm and dry, no rash. Neuro:  Strength and sensation are intact. Psych: Normal affect.  Accessory Clinical Findings    ECG personally reviewed by me today -atrial fibrillation, 80 bpm, LAD, LBBB- no acute changes.   Lab Results  Component Value Date   WBC 7.6 03/01/2022   HGB 14.3 03/01/2022   HCT 45.0 03/01/2022   MCV 93.4 03/01/2022   PLT 223 03/01/2022   Lab Results  Component Value Date   CREATININE 1.23 (H) 03/13/2022   BUN 28 (H) 03/13/2022   NA 138 03/13/2022   K 4.7 03/13/2022   CL 103 03/13/2022   CO2 20 03/13/2022   Lab Results  Component Value Date   ALT 33 02/26/2022   AST 28 02/26/2022   ALKPHOS 107 02/26/2022   BILITOT 1.1 02/26/2022   Lab Results  Component Value Date   CHOL 131 01/31/2021   HDL 47 (L) 01/31/2021   LDLCALC 67 01/31/2021   TRIG 90 01/31/2021   CHOLHDL 2.8 01/31/2021    Lab Results  Component Value Date   HGBA1C 5.7 (A) 01/30/2022    Assessment & Plan    1. Permanent atrial fibrillation: Rate controlled.  Continue carvedilol, Xarelto.   2. Chronic combined systolic and diastolic heart failure: Echo in May 2023 showed EF 30%, mild left atrial enlargement, trace aortic insufficiency. Her worsening cardiomyopathy/HFrEF was thought to be possibly tachy-mediated.  Repeat echo in 06/2022 showed no real improvement in EF (30-35%).  Symptomatically, patient has improved.  She does have some ongoing nonpitting lower extremity edema, improved.  Stable chronic dyspnea on exertion.  Otherwise, euvolemic and well compensated on exam. Further escalation of GDMT limited in the setting  of soft BP. Additionally, given sedentary lifestyle and obesity, I do not think she would be a good candidate for SGLT2 inhibitor given high risk of infection. Continue carvedilol, Entresto, spironolactone, and Lasix.   3. Nonobstructive CAD: Cath in September 2011 showed mild plaque in the LAD (20 to 30% stenosis).  Normal stress test in 2019.  Stable with no anginal symptoms. Continue current medications as above.   4. Aortic valve regurgitation: Mild on most recent echo.     5. Hypertension: BP borderline soft.  Asymptomatic.  Continue current antihypertensive regimen.   6. Hyperlipidemia: LDL was 67  in April 2022.  Continue Lipitor.   7. OSA: On CPAP.  Denies any concerns.   8. H/o CVA: On chronic Xarelto.  No recurrence.   9. Disposition: Follow-up in 3-4 months.      Lenna Sciara, NP 07/17/2022, 3:54 PM

## 2022-07-31 ENCOUNTER — Ambulatory Visit: Payer: Medicare Other | Admitting: Family Medicine

## 2022-08-14 ENCOUNTER — Encounter: Payer: Self-pay | Admitting: Family Medicine

## 2022-08-14 ENCOUNTER — Ambulatory Visit (INDEPENDENT_AMBULATORY_CARE_PROVIDER_SITE_OTHER): Payer: Medicare Other | Admitting: Family Medicine

## 2022-08-14 VITALS — BP 117/58 | HR 64 | Ht 63.0 in | Wt 276.0 lb

## 2022-08-14 DIAGNOSIS — I10 Essential (primary) hypertension: Secondary | ICD-10-CM | POA: Diagnosis not present

## 2022-08-14 DIAGNOSIS — E669 Obesity, unspecified: Secondary | ICD-10-CM

## 2022-08-14 DIAGNOSIS — G4730 Sleep apnea, unspecified: Secondary | ICD-10-CM

## 2022-08-14 DIAGNOSIS — I5042 Chronic combined systolic (congestive) and diastolic (congestive) heart failure: Secondary | ICD-10-CM | POA: Diagnosis not present

## 2022-08-14 DIAGNOSIS — E1169 Type 2 diabetes mellitus with other specified complication: Secondary | ICD-10-CM

## 2022-08-14 DIAGNOSIS — Z23 Encounter for immunization: Secondary | ICD-10-CM

## 2022-08-14 LAB — POCT GLYCOSYLATED HEMOGLOBIN (HGB A1C): Hemoglobin A1C: 6 % — AB (ref 4.0–5.6)

## 2022-08-14 NOTE — Assessment & Plan Note (Signed)
Bp awesome today.

## 2022-08-14 NOTE — Assessment & Plan Note (Signed)
No sign of volume overload today.  Continue to weigh regularly.  She is to continue with the [facility] she actually had a follow-up echo about a month ago with no changes to her medication regimen per cardiology.

## 2022-08-14 NOTE — Assessment & Plan Note (Signed)
A1c up a little bit from previous but otherwise looks great today.  We will continue current regimen.  Continue to work on food choices through the holidays.

## 2022-08-14 NOTE — Progress Notes (Addendum)
Established Patient Office Visit  Subjective   Patient ID: Charlotte [name], female    DOB: 09/04/1936  Age: 86 y.o. MRN: [id number]  Chief Complaint  Patient presents with   Congestive Heart Failure   Diabetes    Sent letter for records release to Dr. Larose Kells for eye exam    HPI  F/U CHF - she is doing well overall. She is weighing herself every other day.  No major changes. Says our scale is 4 lb more than hers.  She feels like her weight has been stable at home and that she is not experiencing any excess swelling.  Hypertension- Pt denies chest pain, SOB, dizziness, or heart palpitations.  Taking meds as directed w/o problems.  Denies medication side effects.    She previously saw Dr. Rosaria Ferries for her last eye exam.  Diabetes - no hypoglycemic events. No wounds or sores that are not healing well. No increased thirst or urination. Checking glucose at home. Taking medications as prescribed without any side effects.  She also tapered gradually off the methocarbamol and did not notice any difference without the medication so would like to discontinue it completely.  Also she has been sleeping better since using her CPAP more consistently.  She still wetting herself at night but not waking up to urinate so sleep has been much more consistent and restful.     ROS    Objective:     BP (!) 117/58   Pulse 64   Ht 5\' 3"  (1.6 m)   Wt 276 lb (125.2 kg)   SpO2 96%   BMI 48.89 kg/m    Physical Exam Vitals and nursing note reviewed.  Constitutional:      Appearance: She is well-developed.  HENT:     Head: Normocephalic and atraumatic.  Cardiovascular:     Rate and Rhythm: Normal rate and regular rhythm.     Heart sounds: Normal heart sounds.  Pulmonary:     Effort: Pulmonary effort is normal.     Breath sounds: Normal breath sounds.  Skin:    General: Skin is warm and dry.  Neurological:     Mental Status: She is alert and oriented to person, place, and time.   Psychiatric:        Behavior: Behavior normal.      Results for orders placed or performed in visit on 08/14/22  POCT glycosylated hemoglobin (Hb A1C)  Result Value Ref Range   Hemoglobin A1C 6.0 (A) 4.0 - 5.6 %   HbA1c POC (<> result, manual entry)     HbA1c, POC (prediabetic range)     HbA1c, POC (controlled diabetic range)        The ASCVD Risk score (Arnett DK, et al., 2019) failed to calculate for the following reasons:   The 2019 ASCVD risk score is only valid for ages 26 to 28    Assessment & Plan:   Problem List Items Addressed This Visit       Cardiovascular and Mediastinum   Essential hypertension    Bp awesome today.       Chronic combined systolic and diastolic congestive heart failure (HCC) - Primary    No sign of volume overload today.  Continue to weigh regularly.  She is to continue with the [facility] she actually had a follow-up echo about a month ago with no changes to her medication regimen per cardiology.        Respiratory   Severe sleep apnea  Much better since wearing her CPAP consistently she is sleeping better at night and not getting up multiple times.        Endocrine   Diabetes mellitus type 2 in obese (HCC)    A1c up a little bit from previous but otherwise looks great today.  We will continue current regimen.  Continue to work on food choices through the holidays.      Relevant Orders   POCT glycosylated hemoglobin (Hb A1C) (Completed)   Other Visit Diagnoses     Encounter for immunization       Relevant Orders   Pfizer Fall 2023 Covid-19 Vaccine 6yrs and older (Completed)       Return in about 4 months (around 12/13/2022) for Diabetes follow-up, Hypertension.    [name], MD

## 2022-08-14 NOTE — Assessment & Plan Note (Signed)
Much better since wearing her CPAP consistently she is sleeping better at night and not getting up multiple times.

## 2022-09-04 ENCOUNTER — Encounter: Payer: Self-pay | Admitting: *Deleted

## 2022-09-05 ENCOUNTER — Encounter: Payer: Self-pay | Admitting: Family Medicine

## 2022-09-10 ENCOUNTER — Encounter: Payer: Self-pay | Admitting: Family Medicine

## 2022-09-10 NOTE — Telephone Encounter (Signed)
Forms completed, copy sent to scan, copy to hold. Given to front desk to collect payment.   

## 2022-09-13 ENCOUNTER — Encounter: Payer: Self-pay | Admitting: Family Medicine

## 2022-09-15 DIAGNOSIS — G4731 Primary central sleep apnea: Secondary | ICD-10-CM | POA: Diagnosis not present

## 2022-09-24 ENCOUNTER — Encounter: Payer: Self-pay | Admitting: *Deleted

## 2022-10-17 ENCOUNTER — Telehealth: Payer: Self-pay | Admitting: Family Medicine

## 2022-10-17 NOTE — Telephone Encounter (Signed)
Please cal pt. She really needs to be on a bone builder since her vertebral fractue to reduce her risk of complications form her osteoporosis.  If she is ok with this please let me know.

## 2022-10-17 NOTE — Telephone Encounter (Signed)
Atttempted  call to patient. Left voice mail requesting a return call.

## 2022-10-18 MED ORDER — ALENDRONATE SODIUM 70 MG PO TABS: 70.0000 mg | ORAL_TABLET | ORAL | 3 refills | Status: DC

## 2022-10-18 NOTE — Telephone Encounter (Signed)
Patient daughter Butch Penny) responded to Mychart message and message was forwarded to Dr. Madilyn Fireman.

## 2022-10-18 NOTE — Addendum Note (Signed)
Addended by: Beatrice Lecher D on: 10/18/2022 05:03 PM   Modules accepted: Orders

## 2022-10-18 NOTE — Telephone Encounter (Signed)
Attempted call to patient. Left voice mail  that was also going to forward the message via Mychart .  Message was forwarded to Mychart for patient review - also asked for a response to provider question about starting the bone builder medication.

## 2022-10-31 ENCOUNTER — Other Ambulatory Visit: Payer: Self-pay | Admitting: Family Medicine

## 2022-10-31 DIAGNOSIS — B372 Candidiasis of skin and nail: Secondary | ICD-10-CM

## 2022-11-05 NOTE — Progress Notes (Signed)
HPI: Follow-up atrial fibrillation. Catheterization September 2011 showed mild plaque in the LAD at 20 to 30%. Previously had a cardiomyopathy which did improve. Nuclear study August 2019 showed ejection fraction 48% with fixed anteroseptal defect felt secondary to left bundle branch block. No ischemia. Echocardiogram repeated December 2020 and showed normal LV function, mild left ventricular hypertrophy, grade 1 diastolic dysfunction, mild aortic insufficiency. Patient admitted May 2021 with GI bleed and syncope. Initial hemoglobin 6.9.  EGD showed multiple clean-based superficial gastric ulcers. She was transfused 2 units packed red blood cells and treated with IV Protonix.  Xarelto was held. Also had transient renal insufficiency with creatinine of 1.84 which improved prior to discharge. Monitor June 2023 showed atrial fibrillation rate controlled.  Echocardiogram September 2023 showed ejection fraction 30 to 35% with prominent dyssynchrony, moderate left atrial enlargement, mild aortic insufficiency.  Since last seen she has dyspnea with activities.  She denies orthopnea or PND.  Occasional minimal pedal edema.  She denies chest pain or syncope.  No bleeding.  Current Outpatient Medications  Medication Sig Dispense Refill   acetaminophen (TYLENOL) 500 MG tablet Take 1,000 mg by mouth in the morning and at bedtime.     alendronate (FOSAMAX) 70 MG tablet Take 1 tablet (70 mg total) by mouth every 7 (seven) days. Take with a full glass of water on an empty stomach. 12 tablet 3   atorvastatin (LIPITOR) 40 MG tablet TAKE 1 TABLET BY MOUTH  DAILY 100 tablet 1   carvedilol (COREG) 12.5 MG tablet Take 1 tablet (12.5 mg total) by mouth 2 (two) times daily with a meal.     DULoxetine (CYMBALTA) 30 MG capsule TAKE 1 CAPSULE BY MOUTH DAILY 90 capsule 3   Ferrous Sulfate (IRON PO) Take 1 tablet by mouth every other day.     furosemide (LASIX) 40 MG tablet Take 1 tablet (40 mg total) by mouth 2 (two) times  daily. 180 tablet 3   Multiple Vitamins-Minerals (SYSTANE ICAPS AREDS2) CAPS Take 1 capsule by mouth daily.     omeprazole (PRILOSEC) 40 MG capsule TAKE 1 CAPSULE BY MOUTH TWICE  DAILY IN THE MORNING AND AT  BEDTIME 200 capsule 2   potassium chloride 20 MEQ TBCR Take 20 mEq by mouth 2 (two) times daily. 180 tablet 3   sacubitril-valsartan (ENTRESTO) 24-26 MG Take 1 tablet by mouth 2 (two) times daily. 180 tablet 3   spironolactone (ALDACTONE) 25 MG tablet Take 0.5 tablets (12.5 mg total) by mouth daily. 90 tablet 3   XARELTO 20 MG TABS tablet TAKE 1 TABLET BY MOUTH  DAILY WITH SUPPER 100 tablet 1   No current facility-administered medications for this visit.     Past Medical History:  Diagnosis Date   Aortic insufficiency    Atrial fibrillation ([place])    a. s/p DCCV 03/2013   CAD (coronary artery disease)    LHC 06/2010: EF 55%, mild plaque in the LAD 20-30%, otherwise normal coronary arteries   CATARACTS    Chronic systolic heart failure ([place])    in setting of AFib;  Echocardiogram 02/2013: Moderate LVH, EF 25-30%, anteroseptal and apical HK, moderate AI, MAC, moderate MR, moderate LAE, mild RAE, PASP 32 => after DCCV f/u Echo 8/14: Moderate LVH, EF 60-65%, normal wall motion, grade 1 diastolic dysfunction, moderate AI, moderate LAE   Complication of anesthesia 2009   nausea and vomitting   GERD (gastroesophageal reflux disease)    HYPERLIPIDEMIA, MILD    HYPERTENSION, BENIGN SYSTEMIC  Irritable bowel syndrome    LBBB (left bundle branch block)    MEDIAL EPICONDYLITIS    NSVT (nonsustained ventricular tachycardia) (HCC)    during Dob Echo 2011 => normal cath   OSTEOARTHRITIS    OSTEOPENIA    PONV (postoperative nausea and vomiting)    Pre-diabetes    RHINITIS, ALLERGIC    Stroke ([place])    SYNCOPE    VITAMIN D DEFICIENCY     Past Surgical History:  Procedure Laterality Date   BIOPSY  03/01/2020   Procedure: BIOPSY;  Surgeon: Otis Brace, MD;  Location: [facility];   Service: Gastroenterology;;   CARDIOVERSION N/A 04/01/2013   Procedure: CARDIOVERSION;  Surgeon: Josue Hector, MD;  Location: [facility] ENDOSCOPY;  Service: Cardiovascular;  Laterality: N/A;   CARDIOVERSION N/A 07/16/2017   Procedure: CARDIOVERSION;  Surgeon: Pixie Casino, MD;  Location: [facility] ENDOSCOPY;  Service: Cardiovascular;  Laterality: N/A;   CARDIOVERSION N/A 07/07/2018   Procedure: CARDIOVERSION;  Surgeon: Pixie Casino, MD;  Location: [facility];  Service: Cardiovascular;  Laterality: N/A;   CATARACT EXTRACTION, BILATERAL  2012   Dr. Herbert Deaner   COLONOSCOPY WITH PROPOFOL N/A 05/23/2017   Procedure: COLONOSCOPY WITH PROPOFOL;  Surgeon: Wonda Horner, MD;  Location: [facility] ENDOSCOPY;  Service: Endoscopy;  Laterality: N/A;   ESOPHAGOGASTRODUODENOSCOPY N/A 03/01/2020   Procedure: ESOPHAGOGASTRODUODENOSCOPY (EGD);  Surgeon: Otis Brace, MD;  Location: [facility] ENDOSCOPY;  Service: Gastroenterology;  Laterality: N/A;   ESOPHAGOGASTRODUODENOSCOPY (EGD) WITH PROPOFOL N/A 05/23/2017   Procedure: ESOPHAGOGASTRODUODENOSCOPY (EGD) WITH PROPOFOL;  Surgeon: Wonda Horner, MD;  Location: [facility] ENDOSCOPY;  Service: Endoscopy;  Laterality: N/A;   IR KYPHO LUMBAR INC FX REDUCE BONE BX UNI/BIL CANNULATION INC/IMAGING  06/06/2017   IR RADIOLOGIST EVAL & MGMT  07/01/2017   RADICAL HYSTERECTOMY     REPLACEMENT TOTAL KNEE     11/03/07 right....... 03/29/08 left    Social History   Socioeconomic History   Marital status: Divorced    Spouse name: Not on file   Number of children: 1   Years of education: 12   Highest education level: High school graduate  Occupational History   Occupation: Retired    Fish farm manager: RETIRED  Tobacco Use   Smoking status: Former    Types: Cigarettes    Quit date: 10/08/1970    Years since quitting: 52.1   Smokeless tobacco: Never  Vaping Use   Vaping Use: Never used  Substance and Sexual Activity   Alcohol use: No   Drug use: No   Sexual activity: Not on file  Other Topics  Concern   Not on file  Social History Narrative   Lives by herself at home, next to her daughter. She enjoys reading, crocheting and knitting.   Social Determinants of Health   Financial Resource Strain: Low Risk  (05/02/2022)   Overall Financial Resource Strain (CARDIA)    Difficulty of Paying Living Expenses: Not hard at all  Food Insecurity: No Food Insecurity (05/02/2022)   Hunger Vital Sign    Worried About Running Out of Food in the Last Year: Never true    Ran Out of Food in the Last Year: Never true  Recent Concern: Food Insecurity - Food Insecurity Present (03/21/2022)   Hunger Vital Sign    Worried About Running Out of Food in the Last Year: Sometimes true    Ran Out of Food in the Last Year: Sometimes true  Transportation Needs: No Transportation Needs (05/02/2022)   PRAPARE - Transportation    Lack  of Transportation (Medical): No    Lack of Transportation (Non-Medical): No  Physical Activity: Inactive (05/02/2022)   Exercise Vital Sign    Days of Exercise per Week: 0 days    Minutes of Exercise per Session: 0 min  Stress: No Stress Concern Present (05/02/2022)   [place]    Feeling of Stress : Not at all  Social Connections: Socially Isolated (05/02/2022)   Social Connection and Isolation Panel [NHANES]    Frequency of Communication with Friends and Family: More than three times a week    Frequency of Social Gatherings with Friends and Family: More than three times a week    Attends Religious Services: Never    Marine scientist or Organizations: No    Attends Archivist Meetings: Never    Marital Status: Divorced  Human resources officer Violence: Not At Risk (05/02/2022)   Humiliation, Afraid, Rape, and Kick questionnaire    Fear of Current or Ex-Partner: No    Emotionally Abused: No    Physically Abused: No    Sexually Abused: No    Family History  Problem Relation Age of Onset    Arrhythmia Mother        afib   Alzheimer's disease Mother    Stroke Mother    Colon cancer Father    Heart attack Father 19   Diabetes Sister    Arrhythmia Sister        afib   Stroke Maternal Grandmother     ROS: no fevers or chills, productive cough, hemoptysis, dysphasia, odynophagia, melena, hematochezia, dysuria, hematuria, rash, seizure activity, orthopnea, PND, pedal edema, claudication. Remaining systems are negative.  Physical Exam: Well-developed obese in no acute distress.  Skin is warm and dry.  HEENT is normal.  Neck is supple.  Chest is clear to auscultation with normal expansion.  Cardiovascular exam is irregular Abdominal exam nontender or distended. No masses palpated. Extremities show trace edema. neuro grossly intact  A/P  1 permanent atrial fibrillation-continue carvedilol and Xarelto at present dose.  Heart rate controlled on recent monitor.  Check hemoglobin and renal function.  Will likely need to decrease Xarelto to 15 mg daily pending renal function.  2 cardiomyopathy/chronic systolic congestive heart failure-we will continue Entresto, carvedilol, spironolactone and Lasix.  W[place] Glade not added due to large pannus and risk of perineal infection.  Note she does have significant dyssynchrony on her echocardiogram which is a potential cause of her cardiomyopathy.  Given age and comorbidities I have not referred for biventricular ICD as her CHF symptoms are reasonly well-controlled.  3 hypertension-blood pressure controlled.  Continue present medications.  4 hyperlipidemia-continue statin.  Check lipids and liver.  5 coronary artery disease-continue statin.  She is not on aspirin given need for anticoagulation.  6 prior CVA-continue Xarelto.  7 lower extremity edema-continue present dose of diuretics.  Kirk Ruths, MD

## 2022-11-14 NOTE — Telephone Encounter (Signed)
error 

## 2022-11-15 ENCOUNTER — Encounter (HOSPITAL_COMMUNITY): Payer: Self-pay | Admitting: *Deleted

## 2022-11-16 ENCOUNTER — Ambulatory Visit: Payer: Medicare Other | Attending: Cardiology | Admitting: Cardiology

## 2022-11-16 ENCOUNTER — Encounter: Payer: Self-pay | Admitting: Cardiology

## 2022-11-16 VITALS — BP 113/71 | HR 80 | Ht 63.0 in | Wt 284.0 lb

## 2022-11-16 DIAGNOSIS — I420 Dilated cardiomyopathy: Secondary | ICD-10-CM | POA: Diagnosis not present

## 2022-11-16 DIAGNOSIS — E785 Hyperlipidemia, unspecified: Secondary | ICD-10-CM | POA: Diagnosis not present

## 2022-11-16 DIAGNOSIS — I4821 Permanent atrial fibrillation: Secondary | ICD-10-CM

## 2022-11-16 DIAGNOSIS — I10 Essential (primary) hypertension: Secondary | ICD-10-CM | POA: Diagnosis not present

## 2022-11-16 DIAGNOSIS — I5042 Chronic combined systolic (congestive) and diastolic (congestive) heart failure: Secondary | ICD-10-CM

## 2022-11-16 DIAGNOSIS — I2510 Atherosclerotic heart disease of native coronary artery without angina pectoris: Secondary | ICD-10-CM

## 2022-11-16 NOTE — Patient Instructions (Signed)
    Follow-Up: At [facility], you and your health needs are our priority.  As part of our continuing mission to provide you with exceptional heart care, we have created designated Provider Care Teams.  These Care Teams include your primary Cardiologist (physician) and Advanced Practice Providers (APPs -  Physician Assistants and Nurse Practitioners) who all work together to provide you with the care you need, when you need it.  We recommend signing up for the patient portal called "MyChart".  Sign up information is provided on this After Visit Summary.  MyChart is used to connect with patients for Virtual Visits (Telemedicine).  Patients are able to view lab/test results, encounter notes, upcoming appointments, etc.  Non-urgent messages can be sent to your provider as well.   To learn more about what you can do with MyChart, go to [email or web address].    Your next appointment:   6 month(s)  Provider:   Brian Crenshaw, MD      

## 2022-11-17 LAB — COMPREHENSIVE METABOLIC PANEL
ALT: 18 IU/L (ref 0–32)
AST: 20 IU/L (ref 0–40)
Albumin/Globulin Ratio: 1.2 (ref 1.2–2.2)
Albumin: 3.7 g/dL (ref 3.7–4.7)
Alkaline Phosphatase: 97 IU/L (ref 44–121)
BUN/Creatinine Ratio: 22 (ref 12–28)
BUN: 23 mg/dL (ref 8–27)
Bilirubin Total: 0.6 mg/dL (ref 0.0–1.2)
CO2: 25 mmol/L (ref 20–29)
Calcium: 9.1 mg/dL (ref 8.7–10.3)
Chloride: 102 mmol/L (ref 96–106)
Creatinine, Ser: 1.05 mg/dL — ABNORMAL HIGH (ref 0.57–1.00)
Globulin, Total: 3 g/dL (ref 1.5–4.5)
Glucose: 106 mg/dL — ABNORMAL HIGH (ref 70–99)
Potassium: 4.5 mmol/L (ref 3.5–5.2)
Sodium: 139 mmol/L (ref 134–144)
Total Protein: 6.7 g/dL (ref 6.0–8.5)
eGFR: 52 mL/min/{1.73_m2} — ABNORMAL LOW (ref >59)

## 2022-11-17 LAB — CBC
Hematocrit: 41.6 % (ref 34.0–46.6)
Hemoglobin: 13.3 g/dL (ref 11.1–15.9)
MCH: 30.3 pg (ref 26.6–33.0)
MCHC: 32 g/dL (ref 31.5–35.7)
MCV: 95 fL (ref 79–97)
Platelets: 210 10*3/uL (ref 150–450)
RBC: 4.39 x10E6/uL (ref 3.77–5.28)
RDW: 12.6 % (ref 11.7–15.4)
WBC: 6.1 10*3/uL (ref 3.4–10.8)

## 2022-11-17 LAB — LIPID PANEL
Chol/HDL Ratio: 3.3 ratio (ref 0.0–4.4)
Cholesterol, Total: 128 mg/dL (ref 100–199)
HDL: 39 mg/dL — ABNORMAL LOW (ref >39)
LDL Chol Calc (NIH): 72 mg/dL (ref 0–99)
Triglycerides: 87 mg/dL (ref 0–149)
VLDL Cholesterol Cal: 17 mg/dL (ref 5–40)

## 2022-11-20 ENCOUNTER — Encounter: Payer: Self-pay | Admitting: Family Medicine

## 2022-11-20 DIAGNOSIS — B372 Candidiasis of skin and nail: Secondary | ICD-10-CM

## 2022-11-20 MED ORDER — NYSTATIN 100000 UNIT/GM EX CREA: TOPICAL_CREAM | CUTANEOUS | 4 refills | Status: DC

## 2022-11-20 MED ORDER — NYSTATIN 100000 UNIT/GM EX POWD: 1.0000 | Freq: Three times a day (TID) | CUTANEOUS | 3 refills | Status: DC

## 2022-11-20 NOTE — Telephone Encounter (Signed)
Meds ordered this encounter  Medications   nystatin (MYCOSTATIN/NYSTOP) powder    Sig: Apply 1 Application topically 3 (three) times daily.    Dispense:  60 g    Refill:  3   nystatin cream (MYCOSTATIN)    Sig: Apply to affected area 2 times daily    Dispense:  60 g    Refill:  4   Sent to [facility].           I accidentally sent it to [facility] as well so I apologize but if we could call [facility] and cancel those prescription.

## 2022-11-21 NOTE — Telephone Encounter (Signed)
Cancelled prescriptions at [facility].

## 2022-12-16 ENCOUNTER — Other Ambulatory Visit: Payer: Self-pay | Admitting: Family Medicine

## 2022-12-16 DIAGNOSIS — G603 Idiopathic progressive neuropathy: Secondary | ICD-10-CM

## 2022-12-18 ENCOUNTER — Ambulatory Visit (INDEPENDENT_AMBULATORY_CARE_PROVIDER_SITE_OTHER): Payer: Medicare Other | Admitting: Family Medicine

## 2022-12-18 ENCOUNTER — Encounter: Payer: Self-pay | Admitting: Family Medicine

## 2022-12-18 VITALS — BP 120/79 | HR 45 | Ht 63.0 in | Wt 288.0 lb

## 2022-12-18 DIAGNOSIS — J3489 Other specified disorders of nose and nasal sinuses: Secondary | ICD-10-CM

## 2022-12-18 DIAGNOSIS — J301 Allergic rhinitis due to pollen: Secondary | ICD-10-CM | POA: Diagnosis not present

## 2022-12-18 DIAGNOSIS — E669 Obesity, unspecified: Secondary | ICD-10-CM | POA: Diagnosis not present

## 2022-12-18 DIAGNOSIS — I5043 Acute on chronic combined systolic (congestive) and diastolic (congestive) heart failure: Secondary | ICD-10-CM

## 2022-12-18 DIAGNOSIS — G4730 Sleep apnea, unspecified: Secondary | ICD-10-CM

## 2022-12-18 DIAGNOSIS — K649 Unspecified hemorrhoids: Secondary | ICD-10-CM

## 2022-12-18 DIAGNOSIS — E1169 Type 2 diabetes mellitus with other specified complication: Secondary | ICD-10-CM | POA: Diagnosis not present

## 2022-12-18 DIAGNOSIS — I10 Essential (primary) hypertension: Secondary | ICD-10-CM

## 2022-12-18 LAB — POCT GLYCOSYLATED HEMOGLOBIN (HGB A1C): Hemoglobin A1C: 4.7 % (ref 4.0–5.6)

## 2022-12-18 MED ORDER — TRIAMCINOLONE ACETONIDE 0.1 % EX CREA: 1.0000 | TOPICAL_CREAM | Freq: Two times a day (BID) | CUTANEOUS | 0 refills | Status: DC

## 2022-12-18 MED ORDER — FLUTICASONE PROPIONATE 50 MCG/ACT NA SUSP: 2.0000 | Freq: Every day | NASAL | 2 refills | Status: DC

## 2022-12-18 NOTE — Progress Notes (Signed)
Established Patient Office Visit  Subjective   Patient ID: Charlotte [name], female    DOB: 1936-09-20  Age: 87 y.o. MRN: [id number]  Chief Complaint  Patient presents with   Diabetes   Hemorrhoids    X 1 month. She does experience some bleeding. Not using anything for this.     HPI Hypertension- Pt denies chest pain, SOB, dizziness, or heart palpitations.  Taking meds as directed w/o problems.  Denies medication side effects.    Diabetes - no hypoglycemic events. No wounds or sores that are not healing well. No increased thirst or urination. Checking glucose at home. Taking medications as prescribed without any side effects.  Hemorrhoid have been more bothersome over the last months.    Still has chronic cough.  He feels like its gotten a little worse in the last 6 months.  Maybe a little bit more of a productive cough.  She has not had any shortness of breath or chest pain or chest tightness she has been using a little bit of Mucinex.  More recently she has had a runny nose in the last 2 weeks.  Since the tree pollens increased.  She is happy with her Cymbalta.  She does feel like it helps with the pain in her legs.    ROS    Objective:     BP 120/79   Pulse (!) 45   Ht '5\' 3"'$  (1.6 m)   Wt 288 lb (130.6 kg)   SpO2 96%   BMI 51.02 kg/m    Physical Exam   Results for orders placed or performed in visit on 12/18/22  POCT glycosylated hemoglobin (Hb A1C)  Result Value Ref Range   Hemoglobin A1C 4.7 4.0 - 5.6 %   HbA1c POC (<> result, manual entry)     HbA1c, POC (prediabetic range)     HbA1c, POC (controlled diabetic range)        The ASCVD Risk score (Arnett DK, et al., 2019) failed to calculate for the following reasons:   The 2019 ASCVD risk score is only valid for ages 61 to 44    Assessment & Plan:   Problem List Items Addressed This Visit       Cardiovascular and Mediastinum   Essential hypertension    Well controlled. Continue current regimen. Follow  up in  26mo      CHF (congestive heart failure) (HCC)    No sign of volume overload today.  Weight have been stable at home. Sees wrinkles in her loewr leg in the AMs        Respiratory   Severe sleep apnea    Wearing her CPAP regularly and sleeping for about 10 hours a night. Much less sleep disruption/        Allergic rhinitis    Recommend trial of flonase.         Endocrine   Diabetes mellitus type 2 in obese ([place] - Primary   Relevant Orders   POCT glycosylated hemoglobin (Hb A1C) (Completed)   Other Visit Diagnoses     Bleeding hemorrhoid       Relevant Medications   triamcinolone cream (KENALOG) 0.1 %   Rhinorrhea       Relevant Medications   fluticasone (FLONASE) 50 MCG/ACT nasal spray      Rhinorrhea-since it started a couple of weeks ago when the pollen is increased recommend a trial of fluticasone.  If not improving then please let me know.  Bleeding hemorrhoids-she  does not feel like she struggling with hard bowel movements.  She thinks she may be wiping excessively.  We discussed some over-the-counter treatments that can be helpful such as witch hazel.  Also sent over topical steroid cream to help soothe the area she can also pick up some over-the-counter lidocaine.  If they continue or persist that there are surgical interventions that can be looked at.  Return in about 6 months (around 06/20/2023) for Hypertension.    Beatrice Lecher, MD

## 2022-12-18 NOTE — Assessment & Plan Note (Signed)
No sign of volume overload today.  Weight have been stable at home. Sees wrinkles in her loewr leg in the AMs

## 2022-12-18 NOTE — Assessment & Plan Note (Signed)
Recommend trial of flonase.

## 2022-12-18 NOTE — Assessment & Plan Note (Signed)
Wearing her CPAP regularly and sleeping for about 10 hours a night. Much less sleep disruption/

## 2022-12-18 NOTE — Assessment & Plan Note (Signed)
Well controlled. Continue current regimen. Follow up in  6 mo  

## 2022-12-19 DIAGNOSIS — G4731 Primary central sleep apnea: Secondary | ICD-10-CM | POA: Diagnosis not present

## 2023-01-12 ENCOUNTER — Other Ambulatory Visit: Payer: Self-pay | Admitting: Family Medicine

## 2023-01-24 ENCOUNTER — Other Ambulatory Visit: Payer: Self-pay | Admitting: *Deleted

## 2023-01-24 MED ORDER — ALENDRONATE SODIUM 70 MG PO TABS: 70.0000 mg | ORAL_TABLET | ORAL | 3 refills | Status: DC

## 2023-02-15 ENCOUNTER — Encounter: Payer: Self-pay | Admitting: Family Medicine

## 2023-02-15 ENCOUNTER — Telehealth: Payer: Self-pay | Admitting: Family Medicine

## 2023-02-15 DIAGNOSIS — I4891 Unspecified atrial fibrillation: Secondary | ICD-10-CM

## 2023-02-15 MED ORDER — RIVAROXABAN 20 MG PO TABS: 20.0000 mg | ORAL_TABLET | Freq: Every day | ORAL | 0 refills | Status: DC

## 2023-02-15 MED ORDER — RIVAROXABAN 20 MG PO TABS: 20.0000 mg | ORAL_TABLET | Freq: Every day | ORAL | 1 refills | Status: DC

## 2023-02-15 NOTE — Telephone Encounter (Signed)
Medication has been sent.  

## 2023-02-15 NOTE — Telephone Encounter (Signed)
Patient needing Xarelto supply 10 days ASAP to local pharmacy.Charlotte [name]

## 2023-02-18 NOTE — Progress Notes (Signed)
PATIENT: Charlotte [name] DOB: [birth year]  REASON FOR VISIT: follow up HISTORY FROM: patient  No chief complaint on file.    HISTORY OF PRESENT ILLNESS:  02/18/23 ALL: Charlotte [name] returns for follow up for complex sleep apnea on ASV. She was last seen by me 12/2021 and titration study advised. Titration study 12/18/2021 showed apnea best managed with ASV. Chin strap advised to help with leak. She was seen in follow up with Dr Vickey Huger 02/2022 and doing well. Since,     12/05/2021 ALL: Charlotte [name] returns for follow up for complex sleep apnea on ASV. She was last seen by Dr Vickey Huger 11/2020. She was advised to work with [employer] in person to correct mask fit/leak. Her daughter called in 09/2021 stating that they were not seen in person and Charlotte [name] continued to have difficulty with compliance. We called [employer] again to ask that she be sen in person. Daughther reports that RT called them but did not feel in person visit was needed and stated that current dream Urich mask was best fit for her. She feels that she has mask sealed correctly but notes significant air blowing from top of mask. Daughter is concerned that pressure setting are too strong for her to tolerate. She does note significant improvement in alertness when her mother is able to use ASV. She remains very tired during the day. She will fall asleep anytime she gets still. ESS completed, today, however daughter feels that multiple questions do not apply and no answers are documented. I suspect ESS is higher than stated responses.   She continues atorvastatin 40mg  daily. She is on Xarelto for afib. She continues to use her Rolator. No falls. No stroke symptoms. She is follwed regularly by PCP.     10/20/2020 ALL:  Charlotte [name] is a 87 y.o. female here today for follow up for recently diagnosed severe complex sleep apnea started on ASV therapy.  HST showed AHI 67/hr with 29 central events. She had hypoxemia for > 60 minutes. Titration study on 9/29  showed events were well controlled on ASV (Dreamwear FFM, small).  She reports that she is trying to adjust to therapy.  She had a very difficult time in the beginning with air leaking from the DreamWear mask. She has recently changed to a different fullface mask that covers the bridge of her nose.  She feels that she is doing better with this mask.  She does continue to hear air that prevents her from being able to go to sleep.  Compliance report dated 09/19/2020 through 10/18/2020 reveals that she used ASV 22 of the past 30 days for compliance of 73%.  She used ASV greater than 4 hours to of the past 30 days for compliance of 7%.  Average usage on days used was 1 hour and 27 minutes.  Residual AHI was 8.2 with a EPAP of 10 cm of water and pressure support of 4-15.  There was a significant leak noted in the 95th percentile of 37.1 L/min.   She was last seen by Dr Lucia Gaskins for stroke follow up in 03/2020. She continues Xarelto. She was referred to GI for gastric bleeds but never seen. She has been doing well on pantoprazole. She also continues atorvastatin 40mg .    HISTORY: (copied from previous note)  Charlotte [name] is a 87 year- old Caucasian female  Stroke patient and seen here upon referral on 05/12/2020 from Dr Lucia Gaskins.   Chief concern according to patient :  Charlotte [name] reports excessive daytime sleepiness being more sleepy than she usually used to be.  She has very frequent nocturia and this is not a new issue but may have been present for 2-3 or even 4 years.  Multiple bathroom breaks means up to 7 nocturia's.  She also has reached a stage of physical deconditioning that makes it harder for her to not get short of breath while she walks or performs rather simple tasks.  She is using a walker for ambulation, but she reports that she sleeps because of orthopnea better than her back is elevated so a recliner is more conducive to sleep than her bed. Family members have not heard her snore and there is a good  chance that this patient has central apnea due to her congestive heart failure history associated with atrial fibrillation.  She also may be much more unrefreshed because of anemia and chronic blood loss in the setting of chronic anticoagulation.  She had suffered a GI bleed in the past.  There was no alternative to resuming at least some level of anticoagulation safely.  Remains on Xarelto and  No longer on ASA.    I have the pleasure of seeing Charlotte [name] today, a right -handed White or Caucasian female with a possible sleep disorder.  She has a  has a past medical history of Aortic insufficiency, Atrial fibrillation (HCC), CAD (coronary artery disease), CATARACTS, Chronic systolic heart failure (HCC), Complication of anesthesia (2009), GERD (gastroesophageal reflux disease), HYPERLIPIDEMIA, MILD, HYPERTENSION, BENIGN SYSTEMIC, Irritable bowel syndrome, LBBB (left bundle branch block), MEDIAL EPICONDYLITIS, NSVT (nonsustained ventricular tachycardia) (HCC), OSTEOARTHRITIS, OSTEOPENIA, PONV (postoperative nausea and vomiting), Pre-diabetes, RHINITIS, ALLERGIC, Stroke (HCC), SYNCOPE, and VITAMIN D DEFICIENCY.   Social history:  Patient is retired from [employer], and lives in a household with alone.  Family status is widowed  with adult children, her daughter is her neighbour.   She likes puzzles and sudoko. No longer reads.  Pets are not present. Tobacco use "not in 50 years ".  ETOH use ;none ,  Caffeine intake in form of Coffee( some) Soda( /) Tea ( some) or energy drinks.   Sleep habits are as follows: The patient's dinner time is between 6 PM.  The patient goes to bed at 10.30 PM and continues to sleep for one hour at a time , fragmented  hourly, wakes for nocturia- bathroom breaks. The preferred sleep position is in bed in a lateral or supine position, but she changes soon to the recliner-  About 4-5 AM.  9 AM is the usual rise time. The patient wakes up spontaneously.  She  reports not feeling refreshed or restored in AM, with symptoms such as dry mouth and residual fatigue.  Naps are taken frequently,  She will doze off - lasting from 20-60 minutes and are more refreshing than nocturnal sleep.   REVIEW OF SYSTEMS: Out of a complete 14 system review of symptoms, the patient complains only of the following symptoms, fatigue, shob, insomnia and all other reviewed systems are negative.  ESS: 11, did not answer question regarding sitting in public place or riding in car for 1 hour    ALLERGIES: Allergies  Allergen Reactions   Metformin And Related Other (See Comments)    Renal failure   Amlodipine Other (See Comments)    HEADACHE    HOME MEDICATIONS: Outpatient Medications Prior to Visit  Medication Sig Dispense Refill   acetaminophen (TYLENOL) 500 MG tablet Take 1,000 mg  by mouth in the morning and at bedtime.     alendronate (FOSAMAX) 70 MG tablet Take 1 tablet (70 mg total) by mouth every 7 (seven) days. Take with a full glass of water on an empty stomach. 12 tablet 3   atorvastatin (LIPITOR) 40 MG tablet TAKE 1 TABLET BY MOUTH  DAILY 100 tablet 1   carvedilol (COREG) 12.5 MG tablet Take 1 tablet (12.5 mg total) by mouth 2 (two) times daily with a meal.     DULoxetine (CYMBALTA) 30 MG capsule TAKE 1 CAPSULE BY MOUTH DAILY 90 capsule 3   Ferrous Sulfate (IRON PO) Take 1 tablet by mouth every other day.     fluticasone (FLONASE) 50 MCG/ACT nasal spray Place 2 sprays into both nostrils daily. 16 g 2   furosemide (LASIX) 40 MG tablet TAKE 1 TABLET BY MOUTH TWICE  DAILY 200 tablet 1   Multiple Vitamins-Minerals (SYSTANE ICAPS AREDS2) CAPS Take 1 capsule by mouth daily.     nystatin (MYCOSTATIN/NYSTOP) powder Apply 1 Application topically 3 (three) times daily. 60 g 3   nystatin cream (MYCOSTATIN) Apply to affected area 2 times daily 60 g 4   omeprazole (PRILOSEC) 40 MG capsule TAKE 1 CAPSULE BY MOUTH TWICE  DAILY IN THE MORNING AND AT  BEDTIME 200 capsule 2    potassium chloride 20 MEQ TBCR Take 20 mEq by mouth 2 (two) times daily. 180 tablet 3   rivaroxaban (XARELTO) 20 MG TABS tablet Take 1 tablet (20 mg total) by mouth daily with supper. 14 tablet 0   sacubitril-valsartan (ENTRESTO) 24-26 MG Take 1 tablet by mouth 2 (two) times daily. 180 tablet 3   spironolactone (ALDACTONE) 25 MG tablet Take 0.5 tablets (12.5 mg total) by mouth daily. 90 tablet 3   triamcinolone cream (KENALOG) 0.1 % Apply 1-2 Applications topically 2 (two) times daily. Given skin a break after 2 weeks. 30 g 0   No facility-administered medications prior to visit.    PAST MEDICAL HISTORY: Past Medical History:  Diagnosis Date   Aortic insufficiency    Atrial fibrillation (HCC)    a. s/p DCCV 03/2013   CAD (coronary artery disease)    LHC 06/2010: EF 55%, mild plaque in the LAD 20-30%, otherwise normal coronary arteries   CATARACTS    Chronic systolic heart failure (HCC)    in setting of AFib;  Echocardiogram 02/2013: Moderate LVH, EF 25-30%, anteroseptal and apical HK, moderate AI, MAC, moderate MR, moderate LAE, mild RAE, PASP 32 => after DCCV f/u Echo 8/14: Moderate LVH, EF 60-65%, normal wall motion, grade 1 diastolic dysfunction, moderate AI, moderate LAE   Complication of anesthesia 2009   nausea and vomitting   GERD (gastroesophageal reflux disease)    HYPERLIPIDEMIA, MILD    HYPERTENSION, BENIGN SYSTEMIC    Irritable bowel syndrome    LBBB (left bundle branch block)    MEDIAL EPICONDYLITIS    NSVT (nonsustained ventricular tachycardia) (HCC)    during Dob Echo 2011 => normal cath   OSTEOARTHRITIS    OSTEOPENIA    PONV (postoperative nausea and vomiting)    Pre-diabetes    RHINITIS, ALLERGIC    Stroke (HCC)    SYNCOPE    VITAMIN D DEFICIENCY     PAST SURGICAL HISTORY: Past Surgical History:  Procedure Laterality Date   BIOPSY  03/01/2020   Procedure: BIOPSY;  Surgeon: Kathi Der, MD;  Location: [facility] ENDOSCOPY;  Service: Gastroenterology;;    CARDIOVERSION N/A 04/01/2013   Procedure: CARDIOVERSION;  Surgeon:  Wendall Stade, MD;  Location: [facility] ENDOSCOPY;  Service: Cardiovascular;  Laterality: N/A;   CARDIOVERSION N/A 07/16/2017   Procedure: CARDIOVERSION;  Surgeon: Chrystie Nose, MD;  Location: [facility] ENDOSCOPY;  Service: Cardiovascular;  Laterality: N/A;   CARDIOVERSION N/A 07/07/2018   Procedure: CARDIOVERSION;  Surgeon: Chrystie Nose, MD;  Location: [facility] ENDOSCOPY;  Service: Cardiovascular;  Laterality: N/A;   CATARACT EXTRACTION, BILATERAL  2012   Dr. Elmer Picker   COLONOSCOPY WITH PROPOFOL N/A 05/23/2017   Procedure: COLONOSCOPY WITH PROPOFOL;  Surgeon: Graylin Shiver, MD;  Location: [facility] ENDOSCOPY;  Service: Endoscopy;  Laterality: N/A;   ESOPHAGOGASTRODUODENOSCOPY N/A 03/01/2020   Procedure: ESOPHAGOGASTRODUODENOSCOPY (EGD);  Surgeon: Kathi Der, MD;  Location: [facility] ENDOSCOPY;  Service: Gastroenterology;  Laterality: N/A;   ESOPHAGOGASTRODUODENOSCOPY (EGD) WITH PROPOFOL N/A 05/23/2017   Procedure: ESOPHAGOGASTRODUODENOSCOPY (EGD) WITH PROPOFOL;  Surgeon: Graylin Shiver, MD;  Location: [facility] ENDOSCOPY;  Service: Endoscopy;  Laterality: N/A;   IR KYPHO LUMBAR INC FX REDUCE BONE BX UNI/BIL CANNULATION INC/IMAGING  06/06/2017   IR RADIOLOGIST EVAL & MGMT  07/01/2017   RADICAL HYSTERECTOMY     REPLACEMENT TOTAL KNEE     11/03/07 right....... 03/29/08 left    FAMILY HISTORY: Family History  Problem Relation Age of Onset   Arrhythmia Mother        afib   Alzheimer's disease Mother    Stroke Mother    Colon cancer Father    Heart attack Father 39   Diabetes Sister    Arrhythmia Sister        afib   Stroke Maternal Grandmother     SOCIAL HISTORY: Social History   Socioeconomic History   Marital status: Divorced    Spouse name: Not on file   Number of children: 1   Years of education: 12   Highest education level: High school graduate  Occupational History   Occupation: Retired    Associate Professor: RETIRED  Tobacco Use   Smoking  status: Former    Types: Cigarettes    Quit date: 10/08/1970    Years since quitting: 52.4   Smokeless tobacco: Never  Vaping Use   Vaping Use: Never used  Substance and Sexual Activity   Alcohol use: No   Drug use: No   Sexual activity: Not on file  Other Topics Concern   Not on file  Social History Narrative   Lives by herself at home, next to her daughter. She enjoys reading, crocheting and knitting.   Social Determinants of Health   Financial Resource Strain: Low Risk  (05/02/2022)   Overall Financial Resource Strain (CARDIA)    Difficulty of Paying Living Expenses: Not hard at all  Food Insecurity: No Food Insecurity (05/02/2022)   Hunger Vital Sign    Worried About Running Out of Food in the Last Year: Never true    Ran Out of Food in the Last Year: Never true  Recent Concern: Food Insecurity - Food Insecurity Present (03/21/2022)   Hunger Vital Sign    Worried About Running Out of Food in the Last Year: Sometimes true    Ran Out of Food in the Last Year: Sometimes true  Transportation Needs: No Transportation Needs (05/02/2022)   PRAPARE - Administrator, Civil Service (Medical): No    Lack of Transportation (Non-Medical): No  Physical Activity: Inactive (05/02/2022)   Exercise Vital Sign    Days of Exercise per Week: 0 days    Minutes of Exercise per Session: 0 min  Stress:  No Stress Concern Present (05/02/2022)   [employer] of Occupational Health - Occupational Stress Questionnaire    Feeling of Stress : Not at all  Social Connections: Socially Isolated (05/02/2022)   Social Connection and Isolation Panel [NHANES]    Frequency of Communication with Friends and Family: More than three times a week    Frequency of Social Gatherings with Friends and Family: More than three times a week    Attends Religious Services: Never    Database administrator or Organizations: No    Attends Banker Meetings: Never    Marital Status: Divorced  Careers information officer Violence: Not At Risk (05/02/2022)   Humiliation, Afraid, Rape, and Kick questionnaire    Fear of Current or Ex-Partner: No    Emotionally Abused: No    Physically Abused: No    Sexually Abused: No     PHYSICAL EXAM  There were no vitals filed for this visit.   There is no height or weight on file to calculate BMI.  Generalized: Well developed, in no acute distress  Cardiology: normal rate and rhythm, no murmur noted Respiratory: clear to auscultation bilaterally  Neurological examination  Mentation: Alert oriented to time, place, history taking. Follows all commands speech and language fluent Cranial nerve II-XII: Pupils were equal round reactive to light. Extraocular movements were full, visual field were full  Motor: The motor testing reveals 5 over 5 strength of all 4 extremities. Good symmetric motor tone is noted throughout.  Gait and station: Gait is stable with Rollator   DIAGNOSTIC DATA (LABS, IMAGING, TESTING) - I reviewed patient records, labs, notes, testing and imaging myself where available.      No data to display           Lab Results  Component Value Date   WBC 6.1 11/16/2022   HGB 13.3 11/16/2022   HCT 41.6 11/16/2022   MCV 95 11/16/2022   PLT 210 11/16/2022      Component Value Date/Time   NA 139 11/16/2022 1102   K 4.5 11/16/2022 1102   CL 102 11/16/2022 1102   CO2 25 11/16/2022 1102   GLUCOSE 106 (H) 11/16/2022 1102   GLUCOSE 110 (H) 03/06/2022 0335   BUN 23 11/16/2022 1102   CREATININE 1.05 (H) 11/16/2022 1102   CREATININE 0.93 01/30/2022 0000   CALCIUM 9.1 11/16/2022 1102   PROT 6.7 11/16/2022 1102   ALBUMIN 3.7 11/16/2022 1102   AST 20 11/16/2022 1102   ALT 18 11/16/2022 1102   ALKPHOS 97 11/16/2022 1102   BILITOT 0.6 11/16/2022 1102   GFRNONAA >60 03/06/2022 0335   GFRNONAA 61 01/31/2021 0000   GFRAA 70 01/31/2021 0000   Lab Results  Component Value Date   CHOL 128 11/16/2022   HDL 39 (L) 11/16/2022   LDLCALC 72  11/16/2022   TRIG 87 11/16/2022   CHOLHDL 3.3 11/16/2022   Lab Results  Component Value Date   HGBA1C 4.7 12/18/2022   Lab Results  Component Value Date   VITAMINB12 319 05/22/2017   Lab Results  Component Value Date   TSH 2.67 07/18/2021     ASSESSMENT AND PLAN 87 y.o. year old female  has a past medical history of Aortic insufficiency, Atrial fibrillation (HCC), CAD (coronary artery disease), CATARACTS, Chronic systolic heart failure (HCC), Complication of anesthesia (2009), GERD (gastroesophageal reflux disease), HYPERLIPIDEMIA, MILD, HYPERTENSION, BENIGN SYSTEMIC, Irritable bowel syndrome, LBBB (left bundle branch block), MEDIAL EPICONDYLITIS, NSVT (nonsustained ventricular tachycardia) (HCC), OSTEOARTHRITIS, OSTEOPENIA, PONV (  postoperative nausea and vomiting), Pre-diabetes, RHINITIS, ALLERGIC, Stroke (HCC), SYNCOPE, and VITAMIN D DEFICIENCY. here with     ICD-10-CM   1. Complex sleep apnea syndrome  G47.31     2. Encounter for counseling on adaptive servo-ventilation (ASV) use  Z71.89        Charlotte [name] continues to have difficulty with ASV therapy. Compliance report reveals sup optimal daily and 4 hour usage. She has had a difficult time with hearing air leaking from her mask and feels pressure settings are too strong. After discussion with sleep lab, I feel it is best to have her return for supervised titration study.  Risks of untreated sleep apnea review and education materials provided. Healthy lifestyle habits encouraged.  She will continue close follow-up with primary care for stroke prevention.  She will follow up in 3 months, sooner if needed.  She verbalizes understanding and agreement with this plan.    No orders of the defined types were placed in this encounter.    No orders of the defined types were placed in this encounter.     I spent 30 minutes with the patient. 50% of this time was spent counseling and educating patient on plan of care and medications.     Shawnie Dapper, FNP-C 02/18/2023, 4:40 PM [facility] [address] [place], [place] [address] [phone]

## 2023-02-18 NOTE — Patient Instructions (Incomplete)
Please continue using your ASV regularly. While your insurance requires that you use ASV at least 4 hours each night on 70% of the nights, I recommend, that you not skip any nights and use it throughout the night if you can. Getting used to ASV and staying with the treatment long term does take time and patience and discipline. Untreated obstructive sleep apnea when it is moderate to severe can have an adverse impact on cardiovascular health and raise her risk for heart disease, arrhythmias, hypertension, congestive heart failure, stroke and diabetes. Untreated obstructive sleep apnea causes sleep disruption, nonrestorative sleep, and sleep deprivation. This can have an impact on your day to day functioning and cause daytime sleepiness and impairment of cognitive function, memory loss, mood disturbance, and problems focussing. Using ASV regularly can improve these symptoms.  We will update supply orders, today. Please try to tighten your mask at home. Look for the green smiley face to tell you that there is a good seal.   Follow up in 1 year

## 2023-02-19 ENCOUNTER — Encounter: Payer: Self-pay | Admitting: Family Medicine

## 2023-02-19 ENCOUNTER — Telehealth: Payer: Self-pay

## 2023-02-19 ENCOUNTER — Ambulatory Visit: Payer: Medicare Other | Admitting: Family Medicine

## 2023-02-19 VITALS — BP 140/67 | HR 76 | Ht 62.0 in | Wt 290.0 lb

## 2023-02-19 DIAGNOSIS — G4731 Primary central sleep apnea: Secondary | ICD-10-CM

## 2023-02-19 DIAGNOSIS — Z7189 Other specified counseling: Secondary | ICD-10-CM

## 2023-02-19 NOTE — Telephone Encounter (Signed)
Patients daughter Keiyanna Markson came by office to drop off FMLA Paperwork to be completed by PCP, forms placed in Dr. Shelah Lewandowsky box, thanks.

## 2023-02-20 NOTE — Telephone Encounter (Signed)
Forms completed and placed in Tonya B basket 

## 2023-02-21 ENCOUNTER — Other Ambulatory Visit: Payer: Self-pay

## 2023-02-21 MED ORDER — ATORVASTATIN CALCIUM 40 MG PO TABS: 40.0000 mg | ORAL_TABLET | Freq: Every day | ORAL | 2 refills | Status: DC

## 2023-02-21 NOTE — Telephone Encounter (Signed)
Forms copied, scanned and pt's daughter notified.   Pt will need to be contacted about payment.

## 2023-03-28 ENCOUNTER — Encounter (INDEPENDENT_AMBULATORY_CARE_PROVIDER_SITE_OTHER): Payer: Medicare Other | Admitting: Family Medicine

## 2023-03-28 DIAGNOSIS — B379 Candidiasis, unspecified: Secondary | ICD-10-CM

## 2023-03-28 MED ORDER — FLUCONAZOLE 150 MG PO TABS: 150.0000 mg | ORAL_TABLET | Freq: Once | ORAL | 0 refills | Status: AC

## 2023-03-28 NOTE — Telephone Encounter (Signed)
Please see the MyChart message reply(ies) for my assessment and plan.    This patient gave consent for this Medical Advice Message and is aware that it may result in a bill to [employer], as well as the possibility of receiving a bill for a co-payment or deductible. They are an established patient, but are not seeking medical advice exclusively about a problem treated during an in person or video visit in the last seven days. I did not recommend an in person or video visit within seven days of my reply.    I spent a total of 7 minutes cumulative time within 7 days through [employer].  Charlton Amor, DO

## 2023-04-07 ENCOUNTER — Other Ambulatory Visit: Payer: Self-pay | Admitting: Family Medicine

## 2023-04-18 ENCOUNTER — Other Ambulatory Visit: Payer: Self-pay | Admitting: Cardiology

## 2023-04-18 ENCOUNTER — Other Ambulatory Visit: Payer: Self-pay | Admitting: Family Medicine

## 2023-04-18 DIAGNOSIS — K279 Peptic ulcer, site unspecified, unspecified as acute or chronic, without hemorrhage or perforation: Secondary | ICD-10-CM

## 2023-04-18 DIAGNOSIS — I4819 Other persistent atrial fibrillation: Secondary | ICD-10-CM

## 2023-04-19 ENCOUNTER — Other Ambulatory Visit: Payer: Self-pay | Admitting: Cardiology

## 2023-04-19 DIAGNOSIS — I4819 Other persistent atrial fibrillation: Secondary | ICD-10-CM

## 2023-04-25 ENCOUNTER — Telehealth: Payer: Self-pay | Admitting: Family Medicine

## 2023-04-25 NOTE — Telephone Encounter (Signed)
Can you please attach copy of most recent compliance report, please? TY!

## 2023-05-01 NOTE — Progress Notes (Signed)
HPI: Follow-up atrial fibrillation. Catheterization September 2011 showed mild plaque in the LAD at 20 to 30%. Previously had a cardiomyopathy which did improve. Nuclear study August 2019 showed ejection fraction 48% with fixed anteroseptal defect felt secondary to left bundle branch block. No ischemia. Echocardiogram repeated December 2020 and showed normal LV function, mild left ventricular hypertrophy, grade 1 diastolic dysfunction, mild aortic insufficiency. Patient admitted May 2021 with GI bleed and syncope. Initial hemoglobin 6.9.  EGD showed multiple clean-based superficial gastric ulcers. She was transfused 2 units packed red blood cells and treated with IV Protonix.  Xarelto was held. Also had transient renal insufficiency with creatinine of 1.84 which improved prior to discharge. Monitor June 2023 showed atrial fibrillation rate controlled.  Echocardiogram September 2023 showed ejection fraction 30 to 35% with prominent dyssynchrony, moderate left atrial enlargement, mild aortic insufficiency.  Since last seen she denies dyspnea, chest pain, palpitations or syncope.  She has chronic mild pedal edema.  No bleeding.  Current Outpatient Medications  Medication Sig Dispense Refill   acetaminophen (TYLENOL) 500 MG tablet Take 1,000 mg by mouth in the morning and at bedtime.     alendronate (FOSAMAX) 70 MG tablet Take 1 tablet (70 mg total) by mouth every 7 (seven) days. Take with a full glass of water on an empty stomach. 12 tablet 3   atorvastatin (LIPITOR) 40 MG tablet Take 1 tablet (40 mg total) by mouth daily. 100 tablet 2   carvedilol (COREG) 12.5 MG tablet Take 1 tablet (12.5 mg total) by mouth 2 (two) times daily with a meal.     DULoxetine (CYMBALTA) 30 MG capsule TAKE 1 CAPSULE BY MOUTH DAILY 90 capsule 3   Ferrous Sulfate (IRON PO) Take 1 tablet by mouth every other day.     fluticasone (FLONASE) 50 MCG/ACT nasal spray USE 2 SPRAYS IN BOTH NOSTRILS  DAILY 48 g 1   furosemide  (LASIX) 40 MG tablet TAKE 1 TABLET BY MOUTH TWICE  DAILY 200 tablet 1   Multiple Vitamins-Minerals (SYSTANE ICAPS AREDS2) CAPS Take 1 capsule by mouth daily.     nystatin (MYCOSTATIN/NYSTOP) powder Apply 1 Application topically 3 (three) times daily. 60 g 3   nystatin cream (MYCOSTATIN) Apply to affected area 2 times daily 60 g 4   omeprazole (PRILOSEC) 40 MG capsule TAKE 1 CAPSULE BY MOUTH TWICE  DAILY IN THE MORNING AND AT  BEDTIME 200 capsule 2   Potassium Chloride ER 20 MEQ TBCR TAKE 1 TABLET BY MOUTH TWICE  DAILY 200 tablet 2   rivaroxaban (XARELTO) 20 MG TABS tablet Take 1 tablet (20 mg total) by mouth daily with supper. 14 tablet 0   sacubitril-valsartan (ENTRESTO) 24-26 MG Take 1 tablet by mouth 2 (two) times daily. 180 tablet 3   spironolactone (ALDACTONE) 25 MG tablet TAKE ONE-HALF TABLET BY MOUTH  DAILY 50 tablet 2   triamcinolone cream (KENALOG) 0.1 % APPLY 1 TO 2 APPLICATIONS  TOPICALLY TWICE DAILY GIVEN SKIN A BREAK AFTER 2 WEEKS 30 g 0   No current facility-administered medications for this visit.     Past Medical History:  Diagnosis Date   Aortic insufficiency    Atrial fibrillation (HCC)    a. s/p DCCV 03/2013   CAD (coronary artery disease)    LHC 06/2010: EF 55%, mild plaque in the LAD 20-30%, otherwise normal coronary arteries   CATARACTS    Chronic systolic heart failure (HCC)    in setting of AFib;  Echocardiogram 02/2013: Moderate  LVH, EF 25-30%, anteroseptal and apical HK, moderate AI, MAC, moderate MR, moderate LAE, mild RAE, PASP 32 => after DCCV f/u Echo 8/14: Moderate LVH, EF 60-65%, normal wall motion, grade 1 diastolic dysfunction, moderate AI, moderate LAE   Complication of anesthesia 2009   nausea and vomitting   GERD (gastroesophageal reflux disease)    HYPERLIPIDEMIA, MILD    HYPERTENSION, BENIGN SYSTEMIC    Irritable bowel syndrome    LBBB (left bundle branch block)    MEDIAL EPICONDYLITIS    NSVT (nonsustained ventricular tachycardia) (HCC)    during  Dob Echo 2011 => normal cath   OSTEOARTHRITIS    OSTEOPENIA    PONV (postoperative nausea and vomiting)    Pre-diabetes    RHINITIS, ALLERGIC    Stroke (HCC)    SYNCOPE    VITAMIN D DEFICIENCY     Past Surgical History:  Procedure Laterality Date   BIOPSY  03/01/2020   Procedure: BIOPSY;  Surgeon: Kathi Der, MD;  Location: [facility] ENDOSCOPY;  Service: Gastroenterology;;   CARDIOVERSION N/A 04/01/2013   Procedure: CARDIOVERSION;  Surgeon: Wendall Stade, MD;  Location: [facility] ENDOSCOPY;  Service: Cardiovascular;  Laterality: N/A;   CARDIOVERSION N/A 07/16/2017   Procedure: CARDIOVERSION;  Surgeon: Chrystie Nose, MD;  Location: [facility] ENDOSCOPY;  Service: Cardiovascular;  Laterality: N/A;   CARDIOVERSION N/A 07/07/2018   Procedure: CARDIOVERSION;  Surgeon: Chrystie Nose, MD;  Location: [facility] ENDOSCOPY;  Service: Cardiovascular;  Laterality: N/A;   CATARACT EXTRACTION, BILATERAL  2012   Dr. Elmer Picker   COLONOSCOPY WITH PROPOFOL N/A 05/23/2017   Procedure: COLONOSCOPY WITH PROPOFOL;  Surgeon: Graylin Shiver, MD;  Location: [facility] ENDOSCOPY;  Service: Endoscopy;  Laterality: N/A;   ESOPHAGOGASTRODUODENOSCOPY N/A 03/01/2020   Procedure: ESOPHAGOGASTRODUODENOSCOPY (EGD);  Surgeon: Kathi Der, MD;  Location: [facility] ENDOSCOPY;  Service: Gastroenterology;  Laterality: N/A;   ESOPHAGOGASTRODUODENOSCOPY (EGD) WITH PROPOFOL N/A 05/23/2017   Procedure: ESOPHAGOGASTRODUODENOSCOPY (EGD) WITH PROPOFOL;  Surgeon: Graylin Shiver, MD;  Location: [facility] ENDOSCOPY;  Service: Endoscopy;  Laterality: N/A;   IR KYPHO LUMBAR INC FX REDUCE BONE BX UNI/BIL CANNULATION INC/IMAGING  06/06/2017   IR RADIOLOGIST EVAL & MGMT  07/01/2017   RADICAL HYSTERECTOMY     REPLACEMENT TOTAL KNEE     11/03/07 right....... 03/29/08 left    Social History   Socioeconomic History   Marital status: Divorced    Spouse name: Not on file   Number of children: 1   Years of education: 12   Highest education level: High school graduate   Occupational History   Occupation: Retired    Associate Professor: RETIRED  Tobacco Use   Smoking status: Former    Current packs/day: 0.00    Types: Cigarettes    Quit date: 10/08/1970    Years since quitting: 52.6   Smokeless tobacco: Never  Vaping Use   Vaping status: Never Used  Substance and Sexual Activity   Alcohol use: No   Drug use: No   Sexual activity: Not on file  Other Topics Concern   Not on file  Social History Narrative   Lives by herself at home, next to her daughter. She enjoys reading.   Social Determinants of Health   Financial Resource Strain: Low Risk  (05/07/2023)   Overall Financial Resource Strain (CARDIA)    Difficulty of Paying Living Expenses: Not hard at all  Food Insecurity: No Food Insecurity (05/07/2023)   Hunger Vital Sign    Worried About Running Out of Food in the Last Year: Never  true    Ran Out of Food in the Last Year: Never true  Transportation Needs: No Transportation Needs (05/07/2023)   PRAPARE - Administrator, Civil Service (Medical): No    Lack of Transportation (Non-Medical): No  Physical Activity: Inactive (05/07/2023)   Exercise Vital Sign    Days of Exercise per Week: 0 days    Minutes of Exercise per Session: 0 min  Stress: No Stress Concern Present (05/07/2023)   [employer] of Occupational Health - Occupational Stress Questionnaire    Feeling of Stress : Not at all  Social Connections: Socially Isolated (05/07/2023)   Social Connection and Isolation Panel [NHANES]    Frequency of Communication with Friends and Family: More than three times a week    Frequency of Social Gatherings with Friends and Family: More than three times a week    Attends Religious Services: Never    Database administrator or Organizations: No    Attends Banker Meetings: Never    Marital Status: Divorced  Catering manager Violence: Not At Risk (05/07/2023)   Humiliation, Afraid, Rape, and Kick questionnaire    Fear of Current or  Ex-Partner: No    Emotionally Abused: No    Physically Abused: No    Sexually Abused: No    Family History  Problem Relation Age of Onset   Arrhythmia Mother        afib   Alzheimer's disease Mother    Stroke Mother    Colon cancer Father    Heart attack Father 49   Diabetes Sister    Arrhythmia Sister        afib   Stroke Maternal Grandmother     ROS: no fevers or chills, productive cough, hemoptysis, dysphasia, odynophagia, melena, hematochezia, dysuria, hematuria, rash, seizure activity, orthopnea, PND, pedal edema, claudication. Remaining systems are negative.  Physical Exam: Well-developed obese in no acute distress.  Skin is warm and dry.  HEENT is normal.  Neck is supple.  Chest is clear to auscultation with normal expansion.  Cardiovascular exam is irregular Abdominal exam nontender or distended. No masses palpated. Extremities show no edema. Varicosities noted. neuro grossly intact  EKG Interpretation Date/Time:  Tuesday May 14 2023 14:05:48 EDT Ventricular Rate:  81 PR Interval:    QRS Duration:  132 QT Interval:  384 QTC Calculation: 446 R Axis:   -58  Text Interpretation: Atrial fibrillation Left bundle branch block Confirmed by Olga Millers ([id number]) on 05/14/2023 2:17:39 PM    A/P  1 permanent atrial fibrillation-plan to continue carvedilol and Xarelto.  Check hemoglobin and renal function.  2 cardiomyopathy/chronic systolic congestive heart failure-continue Entresto, spironolactone, carvedilol and Lasix.  We have not added SGLT2 inhibitor due to large pannus and risk of infection.  Patient has significant dyssynchrony on her echocardiogram which is a potential cause of her cardiomyopathy.  However given her age and comorbidities we have not referred for biventricular ICD.  Her CHF symptoms are controlled.  Repeat echocardiogram.  3 coronary artery disease-continue statin.  No aspirin given need for Xarelto.  4 hypertension-patient's blood pressure  is controlled.  Continue present medical regimen.  5 hyperlipidemia-continue statin.  6 history of CVA-continue anticoagulation.  7 lower extremity edema-recently well-controlled.  Continue diuretics.  Olga Millers, MD

## 2023-05-07 ENCOUNTER — Other Ambulatory Visit: Payer: Self-pay | Admitting: Family Medicine

## 2023-05-07 ENCOUNTER — Other Ambulatory Visit: Payer: Self-pay | Admitting: Cardiology

## 2023-05-07 ENCOUNTER — Ambulatory Visit (INDEPENDENT_AMBULATORY_CARE_PROVIDER_SITE_OTHER): Payer: Medicare Other | Admitting: Family Medicine

## 2023-05-07 VITALS — Ht 63.0 in | Wt 284.6 lb

## 2023-05-07 DIAGNOSIS — I4819 Other persistent atrial fibrillation: Secondary | ICD-10-CM

## 2023-05-07 DIAGNOSIS — K649 Unspecified hemorrhoids: Secondary | ICD-10-CM

## 2023-05-07 DIAGNOSIS — Z780 Asymptomatic menopausal state: Secondary | ICD-10-CM

## 2023-05-07 DIAGNOSIS — Z0000 Encounter for general adult medical examination without abnormal findings: Secondary | ICD-10-CM

## 2023-05-07 DIAGNOSIS — J3489 Other specified disorders of nose and nasal sinuses: Secondary | ICD-10-CM

## 2023-05-07 NOTE — Progress Notes (Signed)
MEDICARE ANNUAL WELLNESS VISIT  05/07/2023  Telephone Visit Disclaimer This Medicare AWV was conducted by telephone due to national recommendations for restrictions regarding the COVID-19 Pandemic (e.g. social distancing).  I verified, using two identifiers, that I am speaking with Charlotte [name] or their authorized healthcare agent. I discussed the limitations, risks, security, and privacy concerns of performing an evaluation and management service by telephone and the potential availability of an in-person appointment in the future. The patient expressed understanding and agreed to proceed.  Location of Patient: Home with daughter, Charlotte [name] Location of Provider (nurse):  In the office.  Subjective:    Charlotte [name] is a 87 y.o. female patient of [place], Charlotte Ehlers, MD who had a Medicare Annual Wellness Visit today via telephone. Charlotte [name] is Retired and lives alone. she has 1 child. she reports that she is socially active and does interact with friends/family regularly. she is minimally physically active and enjoys reading.  Patient Care Team: Charlotte Games, MD as PCP - General Charlotte Som Madolyn Frieze, MD as PCP - Cardiology (Cardiology) Charlotte Saupe, MD as Referring Physician (Ophthalmology)     05/07/2023   11:11 AM 05/02/2022   11:05 AM 02/27/2022   12:00 PM 02/17/2021    2:39 PM 03/30/2020    8:56 PM 02/29/2020   10:17 PM 02/29/2020    2:34 PM  Advanced Directives  Does Patient Have a Medical Advance Directive? Yes Yes Yes Yes No No No  Type of Advance Directive Living will Living will Healthcare Power of [place];Living will Living will;Healthcare Power of Attorney     Does patient want to make changes to medical advance directive? No - Patient declined No - Patient declined No - Patient declined No - Patient declined     Copy of Healthcare Power of Attorney in Chart?    No - copy requested     Would patient like information on creating a medical advance directive?      No -  Patient declined     Hospital Utilization Over the Past 12 Months: # of hospitalizations or ER visits: 0 # of surgeries: 0  Review of Systems    Patient reports that her overall health is unchanged compared to last year.  History obtained from chart review and the patient  Patient Reported Readings (BP, Pulse, CBG, Weight, etc) Weight: 284 lb Weight: 44f3  Per patient no change in vitals since last visit, unable to obtain new vitals due to telehealth visit and lack of equipment.  Pain Assessment Pain : No/denies pain     Current Medications & Allergies (verified) Allergies as of 05/07/2023       Reactions   Metformin And Related Other (See Comments)   Renal failure   Amlodipine Other (See Comments)   HEADACHE        Medication List        Accurate as of May 07, 2023 11:23 AM. If you have any questions, ask your nurse or doctor.          acetaminophen 500 MG tablet Commonly known as: TYLENOL Take 1,000 mg by mouth in the morning and at bedtime.   alendronate 70 MG tablet Commonly known as: FOSAMAX Take 1 tablet (70 mg total) by mouth every 7 (seven) days. Take with a full glass of water on an empty stomach.   atorvastatin 40 MG tablet Commonly known as: LIPITOR Take 1 tablet (40 mg total) by mouth daily.   carvedilol 12.5 MG tablet Commonly  known as: COREG Take 1 tablet (12.5 mg total) by mouth 2 (two) times daily with a meal.   DULoxetine 30 MG capsule Commonly known as: CYMBALTA TAKE 1 CAPSULE BY MOUTH DAILY   fluticasone 50 MCG/ACT nasal spray Commonly known as: FLONASE Place 2 sprays into both nostrils daily.   furosemide 40 MG tablet Commonly known as: LASIX TAKE 1 TABLET BY MOUTH TWICE  DAILY   IRON PO Take 1 tablet by mouth every other day.   nystatin powder Commonly known as: MYCOSTATIN/NYSTOP Apply 1 Application topically 3 (three) times daily.   nystatin cream Commonly known as: MYCOSTATIN Apply to affected area 2 times daily    omeprazole 40 MG capsule Commonly known as: PRILOSEC TAKE 1 CAPSULE BY MOUTH TWICE  DAILY IN THE MORNING AND AT  BEDTIME   Potassium Chloride ER 20 MEQ Tbcr TAKE 1 TABLET BY MOUTH TWICE  DAILY   rivaroxaban 20 MG Tabs tablet Commonly known as: Xarelto Take 1 tablet (20 mg total) by mouth daily with supper.   sacubitril-valsartan 24-26 MG Commonly known as: ENTRESTO Take 1 tablet by mouth 2 (two) times daily.   spironolactone 25 MG tablet Commonly known as: ALDACTONE Take 0.5 tablets (12.5 mg total) by mouth daily.   Systane ICaps AREDS2 Caps Take 1 capsule by mouth daily.   triamcinolone cream 0.1 % Commonly known as: KENALOG Apply 1-2 Applications topically 2 (two) times daily. Given skin a break after 2 weeks.        History (reviewed): Past Medical History:  Diagnosis Date   Aortic insufficiency    Atrial fibrillation (HCC)    a. s/p DCCV 03/2013   CAD (coronary artery disease)    LHC 06/2010: EF 55%, mild plaque in the LAD 20-30%, otherwise normal coronary arteries   CATARACTS    Chronic systolic heart failure (HCC)    in setting of AFib;  Echocardiogram 02/2013: Moderate LVH, EF 25-30%, anteroseptal and apical HK, moderate AI, MAC, moderate MR, moderate LAE, mild RAE, PASP 32 => after DCCV f/u Echo 8/14: Moderate LVH, EF 60-65%, normal wall motion, grade 1 diastolic dysfunction, moderate AI, moderate LAE   Complication of anesthesia 2009   nausea and vomitting   GERD (gastroesophageal reflux disease)    HYPERLIPIDEMIA, MILD    HYPERTENSION, BENIGN SYSTEMIC    Irritable bowel syndrome    LBBB (left bundle branch block)    MEDIAL EPICONDYLITIS    NSVT (nonsustained ventricular tachycardia) (HCC)    during Dob Echo 2011 => normal cath   OSTEOARTHRITIS    OSTEOPENIA    PONV (postoperative nausea and vomiting)    Pre-diabetes    RHINITIS, ALLERGIC    Stroke (HCC)    SYNCOPE    VITAMIN D DEFICIENCY    Past Surgical History:  Procedure Laterality Date    BIOPSY  03/01/2020   Procedure: BIOPSY;  Surgeon: Kathi Der, MD;  Location: [facility] ENDOSCOPY;  Service: Gastroenterology;;   CARDIOVERSION N/A 04/01/2013   Procedure: CARDIOVERSION;  Surgeon: Wendall Stade, MD;  Location: [facility] ENDOSCOPY;  Service: Cardiovascular;  Laterality: N/A;   CARDIOVERSION N/A 07/16/2017   Procedure: CARDIOVERSION;  Surgeon: Chrystie Nose, MD;  Location: [facility] ENDOSCOPY;  Service: Cardiovascular;  Laterality: N/A;   CARDIOVERSION N/A 07/07/2018   Procedure: CARDIOVERSION;  Surgeon: Chrystie Nose, MD;  Location: [facility] ENDOSCOPY;  Service: Cardiovascular;  Laterality: N/A;   CATARACT EXTRACTION, BILATERAL  2012   Dr. Elmer Picker   COLONOSCOPY WITH PROPOFOL N/A 05/23/2017   Procedure: COLONOSCOPY  WITH PROPOFOL;  Surgeon: Graylin Shiver, MD;  Location: [facility] ENDOSCOPY;  Service: Endoscopy;  Laterality: N/A;   ESOPHAGOGASTRODUODENOSCOPY N/A 03/01/2020   Procedure: ESOPHAGOGASTRODUODENOSCOPY (EGD);  Surgeon: Kathi Der, MD;  Location: [facility] ENDOSCOPY;  Service: Gastroenterology;  Laterality: N/A;   ESOPHAGOGASTRODUODENOSCOPY (EGD) WITH PROPOFOL N/A 05/23/2017   Procedure: ESOPHAGOGASTRODUODENOSCOPY (EGD) WITH PROPOFOL;  Surgeon: Graylin Shiver, MD;  Location: [facility] ENDOSCOPY;  Service: Endoscopy;  Laterality: N/A;   IR KYPHO LUMBAR INC FX REDUCE BONE BX UNI/BIL CANNULATION INC/IMAGING  06/06/2017   IR RADIOLOGIST EVAL & MGMT  07/01/2017   RADICAL HYSTERECTOMY     REPLACEMENT TOTAL KNEE     11/03/07 right....... 03/29/08 left   Family History  Problem Relation Age of Onset   Arrhythmia Mother        afib   Alzheimer's disease Mother    Stroke Mother    Colon cancer Father    Heart attack Father 59   Diabetes Sister    Arrhythmia Sister        afib   Stroke Maternal Grandmother    Social History   Socioeconomic History   Marital status: Divorced    Spouse name: Not on file   Number of children: 1   Years of education: 12   Highest education level: High school graduate   Occupational History   Occupation: Retired    Associate Professor: RETIRED  Tobacco Use   Smoking status: Former    Current packs/day: 0.00    Types: Cigarettes    Quit date: 10/08/1970    Years since quitting: 52.6   Smokeless tobacco: Never  Vaping Use   Vaping status: Never Used  Substance and Sexual Activity   Alcohol use: No   Drug use: No   Sexual activity: Not on file  Other Topics Concern   Not on file  Social History Narrative   Lives by herself at home, next to her daughter. She enjoys reading.   Social Determinants of Health   Financial Resource Strain: Low Risk  (05/07/2023)   Overall Financial Resource Strain (CARDIA)    Difficulty of Paying Living Expenses: Not hard at all  Food Insecurity: No Food Insecurity (05/07/2023)   Hunger Vital Sign    Worried About Running Out of Food in the Last Year: Never true    Ran Out of Food in the Last Year: Never true  Transportation Needs: No Transportation Needs (05/07/2023)   PRAPARE - Administrator, Civil Service (Medical): No    Lack of Transportation (Non-Medical): No  Physical Activity: Inactive (05/07/2023)   Exercise Vital Sign    Days of Exercise per Week: 0 days    Minutes of Exercise per Session: 0 min  Stress: No Stress Concern Present (05/07/2023)   [employer] of Occupational Health - Occupational Stress Questionnaire    Feeling of Stress : Not at all  Social Connections: Socially Isolated (05/07/2023)   Social Connection and Isolation Panel [NHANES]    Frequency of Communication with Friends and Family: More than three times a week    Frequency of Social Gatherings with Friends and Family: More than three times a week    Attends Religious Services: Never    Database administrator or Organizations: No    Attends Banker Meetings: Never    Marital Status: Divorced    Activities of Daily Living    05/07/2023   11:15 AM  In your present state of health, do you have any difficulty  performing the following activities:  Hearing? 0  Vision? 0  Difficulty concentrating or making decisions? 0  Walking or climbing stairs? 1  Dressing or bathing? 1  Doing errands, shopping? 1  Preparing Food and eating ? N  Using the Toilet? Y  In the past six months, have you accidently leaked urine? Y  Do you have problems with loss of bowel control? Y  Managing your Medications? Y  Managing your Finances? Y  Housekeeping or managing your Housekeeping? Y    Patient Education/ Literacy How often do you need to have someone help you when you read instructions, pamphlets, or other written materials from your doctor or pharmacy?: 1 - Never What is the last grade level you completed in school?: 12th grade  Exercise    Diet Patient reports consuming 3 meals a day and 1 snack(s) a day Patient reports that her primary diet is: Regular Patient reports that she does have regular access to food.   Depression Screen    05/07/2023   11:11 AM 12/18/2022    1:54 PM 08/14/2022    3:46 PM 05/02/2022   11:06 AM 11/14/2021    3:05 PM 10/31/2021    3:36 PM 02/17/2021    2:37 PM  PHQ 2/9 Scores  PHQ - 2 Score 0 3 2 0 0 0 0  PHQ- 9 Score  8 5         Fall Risk    05/07/2023   11:11 AM 12/18/2022    1:31 PM 05/02/2022   11:05 AM 11/14/2021    3:04 PM 10/31/2021    3:35 PM  Fall Risk   Falls in the past year? 1 1 1 1  0  Number falls in past yr: 1 1 1  0 0  Injury with Fall? 0 1 1 0 0  Risk for fall due to : History of fall(s);Impaired mobility History of fall(s);Impaired mobility Impaired balance/gait;History of fall(s) Impaired balance/gait;Impaired mobility Impaired mobility  Follow up Falls evaluation completed;Education provided;Falls prevention discussed Falls evaluation completed;Falls prevention discussed;Education provided Falls evaluation completed;Education provided;Falls prevention discussed Falls prevention discussed;Falls evaluation completed Falls evaluation completed      Objective:  Charlotte [name] seemed alert and oriented and she participated appropriately during our telephone visit.  Blood Pressure Weight BMI  BP Readings from Last 3 Encounters:  02/19/23 (!) 140/67  12/18/22 120/79  11/16/22 113/71   Wt Readings from Last 3 Encounters:  05/07/23 284 lb 9.6 oz (129.1 kg)  02/19/23 290 lb (131.5 kg)  12/18/22 288 lb (130.6 kg)   BMI Readings from Last 1 Encounters:  05/07/23 50.41 kg/m    *Unable to obtain current vital signs, weight, and BMI due to telephone visit type  Hearing/Vision  Charlotte [name] did not seem to have difficulty with hearing/understanding during the telephone conversation Reports that she has had a formal eye exam by an eye care professional within the past year Reports that she has not had a formal hearing evaluation within the past year *Unable to fully assess hearing and vision during telephone visit type  Cognitive Function:    05/07/2023   11:16 AM 05/02/2022   11:11 AM 02/17/2021    2:44 PM  6CIT Screen  What Year? 0 points 0 points 0 points  What month? 0 points 0 points 0 points  What time? 0 points 0 points 0 points  Count back from 20 0 points 0 points 0 points  Months in reverse 0 points 0 points 0 points  Repeat  phrase 0 points 0 points 0 points  Total Score 0 points 0 points 0 points   (Normal:0-7, Significant for Dysfunction: >8)  Normal Cognitive Function Screening: Yes   Immunization & Health Maintenance Record Immunization History  Administered Date(s) Administered   COVID-19, mRNA, vaccine(Comirnaty)12 years and older 08/14/2022   Fluad Quad(high Dose 65+) 06/16/2020, 06/06/2021, 08/14/2022   Influenza Whole 08/07/2006, 08/05/2007, 07/08/2009, 07/25/2009, 06/30/2010   Influenza, High Dose Seasonal PF 07/24/2016, 07/04/2017, 07/13/2019   Influenza,inj,Quad PF,6+ Mos 06/11/2013, 06/18/2014, 06/20/2015   Influenza-Unspecified 08/11/2018   Moderna Sars-Covid-2 Vaccination 12/06/2019, 01/09/2020    PFIZER(Purple Top)SARS-COV-2 Vaccination 09/10/2020   Pfizer Covid-19 Vaccine Bivalent Booster 55yrs & up 12/23/2021   Pneumococcal Conjugate-13 07/19/2014   Pneumococcal Polysaccharide-23 10/08/2002, 02/25/2017   Td 07/25/2009   Tdap 12/23/2021   Zoster Recombinant(Shingrix) 10/28/2018, 04/30/2019   Zoster, Live 09/21/2009    Health Maintenance  Topic Date Due   Diabetic kidney evaluation - Urine ACR  05/08/2023 (Originally 01/31/2023)   FOOT EXAM  05/08/2023 (Originally 01/31/2023)   COVID-19 Vaccine (6 - 2023-24 season) 05/23/2023 (Originally 12/13/2022)   DEXA SCAN  05/06/2024 (Originally 07/11/2021)   INFLUENZA VACCINE  05/09/2023   OPHTHALMOLOGY EXAM  06/13/2023   HEMOGLOBIN A1C  06/20/2023   Diabetic kidney evaluation - eGFR measurement  11/17/2023   Medicare Annual Wellness (AWV)  05/06/2024   DTaP/Tdap/Td (3 - Td or Tdap) 12/24/2031   Pneumonia Vaccine 51+ Years old  Completed   Zoster Vaccines- Shingrix  Completed   HPV VACCINES  Aged Out       Assessment  This is a routine wellness examination for Charlotte [name].  Health Maintenance: Due or Overdue There are no preventive care reminders to display for this patient.   Charlotte [name] does not need a referral for Community Assistance: Care Management:   no Social Work:    no Prescription Assistance:  no Nutrition/Diabetes Education:  no   Plan:  Personalized Goals  Goals Addressed               This Visit's Progress     Patient Stated (pt-stated)        Patient stated that she would like to loose weight.       Personalized Health Maintenance & Screening Recommendations  Bone densitometry screening Foot exam - can be done at the next in office visit with Dr. Linford Arnold Urine ACR -  can be done at the next in office visit with Dr. Linford Arnold  Lung Cancer Screening Recommended: no (Low Dose CT Chest recommended if Age 70-80 years, 20 pack-year currently smoking OR have quit w/in past 15 years) Hepatitis C  Screening recommended: no HIV Screening recommended: no  Advanced Directives: Written information was not prepared per patient's request.  Referrals & Orders Orders Placed This Encounter  Procedures   DEXAScan    Follow-up Plan Follow-up with Charlotte Games, MD as planned Medicare wellness visit in one year.  Patient will access AVS on my chart.    I have personally reviewed and noted the following in the patient's chart:   Medical and social history Use of alcohol, tobacco or illicit drugs  Current medications and supplements Functional ability and status Nutritional status Physical activity Advanced directives List of other physicians Hospitalizations, surgeries, and ER visits in previous 12 months Vitals Screenings to include cognitive, depression, and falls Referrals and appointments  In addition, I have reviewed and discussed with Charlotte [name] certain preventive protocols, quality metrics, and best practice recommendations.  A written personalized care plan for preventive services as well as general preventive health recommendations is available and can be mailed to the patient at her request.      Modesto Charon, RN BSN  05/07/2023

## 2023-05-07 NOTE — Patient Instructions (Addendum)
MEDICARE ANNUAL WELLNESS VISIT Health Maintenance Summary and Written Plan of Care  Charlotte [name] ,  Thank you for allowing me to perform your Medicare Annual Wellness Visit and for your ongoing commitment to your health.   Health Maintenance & Immunization History Health Maintenance  Topic Date Due   Diabetic kidney evaluation - Urine ACR  05/08/2023 (Originally 01/31/2023)   FOOT EXAM  05/08/2023 (Originally 01/31/2023)   COVID-19 Vaccine (6 - 2023-24 season) 05/23/2023 (Originally 12/13/2022)   DEXA SCAN  05/06/2024 (Originally 07/11/2021)   INFLUENZA VACCINE  05/09/2023   OPHTHALMOLOGY EXAM  06/13/2023   HEMOGLOBIN A1C  06/20/2023   Diabetic kidney evaluation - eGFR measurement  11/17/2023   Medicare Annual Wellness (AWV)  05/06/2024   DTaP/Tdap/Td (3 - Td or Tdap) 12/24/2031   Pneumonia Vaccine 74+ Years old  Completed   Zoster Vaccines- Shingrix  Completed   HPV VACCINES  Aged Out   Immunization History  Administered Date(s) Administered   COVID-19, mRNA, vaccine(Comirnaty)12 years and older 08/14/2022   Fluad Quad(high Dose 65+) 06/16/2020, 06/06/2021, 08/14/2022   Influenza Whole 08/07/2006, 08/05/2007, 07/08/2009, 07/25/2009, 06/30/2010   Influenza, High Dose Seasonal PF 07/24/2016, 07/04/2017, 07/13/2019   Influenza,inj,Quad PF,6+ Mos 06/11/2013, 06/18/2014, 06/20/2015   Influenza-Unspecified 08/11/2018   Moderna Sars-Covid-2 Vaccination 12/06/2019, 01/09/2020   PFIZER(Purple Top)SARS-COV-2 Vaccination 09/10/2020   Pfizer Covid-19 Vaccine Bivalent Booster 14yrs & up 12/23/2021   Pneumococcal Conjugate-13 07/19/2014   Pneumococcal Polysaccharide-23 10/08/2002, 02/25/2017   Td 07/25/2009   Tdap 12/23/2021   Zoster Recombinant(Shingrix) 10/28/2018, 04/30/2019   Zoster, Live 09/21/2009    These are the patient goals that we discussed:  Goals Addressed               This Visit's Progress     Patient Stated (pt-stated)        Patient stated that she would like to  loose weight.         This is a list of Health Maintenance Items that are overdue or due now: Bone densitometry screening Foot exam - can be done at the next in office visit with Dr. Linford Arnold Urine ACR -  can be done at the next in office visit with Dr. Linford Arnold    Orders/Referrals Placed Today: Orders Placed This Encounter  Procedures   DEXAScan    Standing Status:   Future    Standing Expiration Date:   05/06/2024    Scheduling Instructions:     Please call patient to schedule    Order Specific Question:   Reason for exam:    Answer:   post menoapusal    Order Specific Question:   Preferred imaging location?    Answer:   [facility]   (Contact our referral department at [phone] if you have not spoken with someone about your referral appointment within the next 5 days)    Follow-up Plan Follow-up with Agapito Games, MD as planned Medicare wellness visit in one year.  Patient will access AVS on my chart.      Health Maintenance, Female Adopting a healthy lifestyle and getting preventive care are important in promoting health and wellness. Ask your health care provider about: The right schedule for you to have regular tests and exams. Things you can do on your own to prevent diseases and keep yourself healthy. What should I know about diet, weight, and exercise? Eat a healthy diet  Eat a diet that includes plenty of vegetables, fruits, low-fat dairy products, and lean protein. Do not eat  a lot of foods that are high in solid fats, added sugars, or sodium. Maintain a healthy weight Body mass index (BMI) is used to identify weight problems. It estimates body fat based on height and weight. Your health care provider can help determine your BMI and help you achieve or maintain a healthy weight. Get regular exercise Get regular exercise. This is one of the most important things you can do for your health. Most adults should: Exercise for at least 150  minutes each week. The exercise should increase your heart rate and make you sweat (moderate-intensity exercise). Do strengthening exercises at least twice a week. This is in addition to the moderate-intensity exercise. Spend less time sitting. Even light physical activity can be beneficial. Watch cholesterol and blood lipids Have your blood tested for lipids and cholesterol at 85 years of age, then have this test every 5 years. Have your cholesterol levels checked more often if: Your lipid or cholesterol levels are high. You are older than 84 years of age. You are at high risk for heart disease. What should I know about cancer screening? Depending on your health history and family history, you may need to have cancer screening at various ages. This may include screening for: Breast cancer. Cervical cancer. Colorectal cancer. Skin cancer. Lung cancer. What should I know about heart disease, diabetes, and high blood pressure? Blood pressure and heart disease High blood pressure causes heart disease and increases the risk of stroke. This is more likely to develop in people who have high blood pressure readings or are overweight. Have your blood pressure checked: Every 3-5 years if you are 75-10 years of age. Every year if you are 51 years old or older. Diabetes Have regular diabetes screenings. This checks your fasting blood sugar level. Have the screening done: Once every three years after age 68 if you are at a normal weight and have a low risk for diabetes. More often and at a younger age if you are overweight or have a high risk for diabetes. What should I know about preventing infection? Hepatitis B If you have a higher risk for hepatitis B, you should be screened for this virus. Talk with your health care provider to find out if you are at risk for hepatitis B infection. Hepatitis C Testing is recommended for: Everyone born from [birth year] through 1963. Anyone with known risk factors  for hepatitis C. Sexually transmitted infections (STIs) Get screened for STIs, including gonorrhea and chlamydia, if: You are sexually active and are younger than 85 years of age. You are older than 85 years of age and your health care provider tells you that you are at risk for this type of infection. Your sexual activity has changed since you were last screened, and you are at increased risk for chlamydia or gonorrhea. Ask your health care provider if you are at risk. Ask your health care provider about whether you are at high risk for HIV. Your health care provider may recommend a prescription medicine to help prevent HIV infection. If you choose to take medicine to prevent HIV, you should first get tested for HIV. You should then be tested every 3 months for as long as you are taking the medicine. Pregnancy If you are about to stop having your period (premenopausal) and you may become pregnant, seek counseling before you get pregnant. Take 400 to 800 micrograms (mcg) of folic acid every day if you become pregnant. Ask for birth control (contraception) if you want to  prevent pregnancy. Osteoporosis and menopause Osteoporosis is a disease in which the bones lose minerals and strength with aging. This can result in bone fractures. If you are 69 years old or older, or if you are at risk for osteoporosis and fractures, ask your health care provider if you should: Be screened for bone loss. Take a calcium or vitamin D supplement to lower your risk of fractures. Be given hormone replacement therapy (HRT) to treat symptoms of menopause. Follow these instructions at home: Alcohol use Do not drink alcohol if: Your health care provider tells you not to drink. You are pregnant, may be pregnant, or are planning to become pregnant. If you drink alcohol: Limit how much you have to: 0-1 drink a day. Know how much alcohol is in your drink. In the [place] one drink equals one 12 oz bottle of beer (355 mL),  one 5 oz glass of wine (148 mL), or one 1 oz glass of hard liquor (44 mL). Lifestyle Do not use any products that contain nicotine or tobacco. These products include cigarettes, chewing tobacco, and vaping devices, such as e-cigarettes. If you need help quitting, ask your health care provider. Do not use street drugs. Do not share needles. Ask your health care provider for help if you need support or information about quitting drugs. General instructions Schedule regular health, dental, and eye exams. Stay current with your vaccines. Tell your health care provider if: You often feel depressed. You have ever been abused or do not feel safe at home. Summary Adopting a healthy lifestyle and getting preventive care are important in promoting health and wellness. Follow your health care provider's instructions about healthy diet, exercising, and getting tested or screened for diseases. Follow your health care provider's instructions on monitoring your cholesterol and blood pressure. This information is not intended to replace advice given to you by your health care provider. Make sure you discuss any questions you have with your health care provider. Document Revised: 02/13/2021 Document Reviewed: 02/13/2021 Elsevier Patient Education  2024 [employer].

## 2023-05-08 ENCOUNTER — Other Ambulatory Visit: Payer: Self-pay | Admitting: Nurse Practitioner

## 2023-05-14 ENCOUNTER — Encounter: Payer: Self-pay | Admitting: Cardiology

## 2023-05-14 ENCOUNTER — Ambulatory Visit: Payer: Medicare Other | Attending: Cardiology | Admitting: Cardiology

## 2023-05-14 VITALS — BP 116/78 | HR 89 | Ht 62.0 in | Wt 290.6 lb

## 2023-05-14 DIAGNOSIS — I4821 Permanent atrial fibrillation: Secondary | ICD-10-CM | POA: Diagnosis not present

## 2023-05-14 DIAGNOSIS — E785 Hyperlipidemia, unspecified: Secondary | ICD-10-CM

## 2023-05-14 DIAGNOSIS — I10 Essential (primary) hypertension: Secondary | ICD-10-CM | POA: Diagnosis not present

## 2023-05-14 DIAGNOSIS — I2510 Atherosclerotic heart disease of native coronary artery without angina pectoris: Secondary | ICD-10-CM

## 2023-05-14 DIAGNOSIS — I5042 Chronic combined systolic (congestive) and diastolic (congestive) heart failure: Secondary | ICD-10-CM

## 2023-05-14 DIAGNOSIS — I420 Dilated cardiomyopathy: Secondary | ICD-10-CM | POA: Diagnosis not present

## 2023-05-14 NOTE — Patient Instructions (Signed)
    Testing/Procedures:  Your physician has requested that you have an echocardiogram. Echocardiography is a painless test that uses sound waves to create images of your heart. It provides your doctor with information about the size and shape of your heart and how well your heart's chambers and valves are working. This procedure takes approximately one hour. There are no restrictions for this procedure. Please do NOT Schrag cologne, perfume, aftershave, or lotions (deodorant is allowed). Please arrive 15 minutes prior to your appointment time. [address] IN [place]   Follow-Up: At [facility], you and your health needs are our priority.  As part of our continuing mission to provide you with exceptional heart care, we have created designated Provider Care Teams.  These Care Teams include your primary Cardiologist (physician) and Advanced Practice Providers (APPs -  Physician Assistants and Nurse Practitioners) who all work together to provide you with the care you need, when you need it.  We recommend signing up for the patient portal called "MyChart".  Sign up information is provided on this After Visit Summary.  MyChart is used to connect with patients for Virtual Visits (Telemedicine).  Patients are able to view lab/test results, encounter notes, upcoming appointments, etc.  Non-urgent messages can be sent to your provider as well.   To learn more about what you can do with MyChart, go to [email or web address].    Your next appointment:   6 month(s)  Provider:   Olga Millers, MD

## 2023-05-17 ENCOUNTER — Encounter: Payer: Self-pay | Admitting: *Deleted

## 2023-05-29 ENCOUNTER — Telehealth: Payer: Self-pay | Admitting: Family Medicine

## 2023-06-01 ENCOUNTER — Encounter: Payer: Self-pay | Admitting: Family Medicine

## 2023-06-03 MED ORDER — FLUCONAZOLE 150 MG PO TABS
150.0000 mg | ORAL_TABLET | ORAL | 0 refills | Status: DC
  Filled 2023-06-03: qty 2, 4d supply, fill #0

## 2023-06-04 ENCOUNTER — Other Ambulatory Visit: Payer: Self-pay

## 2023-06-04 ENCOUNTER — Ambulatory Visit (HOSPITAL_COMMUNITY): Payer: Medicare Other | Attending: Cardiology

## 2023-06-04 DIAGNOSIS — I420 Dilated cardiomyopathy: Secondary | ICD-10-CM | POA: Insufficient documentation

## 2023-06-04 LAB — ECHOCARDIOGRAM COMPLETE: S' Lateral: 4.2 cm

## 2023-06-04 MED ORDER — PERFLUTREN LIPID MICROSPHERE
1.0000 mL | INTRAVENOUS | Status: AC | PRN
Start: 2023-06-04 — End: 2023-06-04
  Administered 2023-06-04: 2 mL via INTRAVENOUS

## 2023-06-05 NOTE — Telephone Encounter (Signed)
ok 

## 2023-06-17 ENCOUNTER — Encounter: Payer: Self-pay | Admitting: *Deleted

## 2023-06-18 ENCOUNTER — Encounter: Payer: Self-pay | Admitting: Family Medicine

## 2023-06-18 DIAGNOSIS — I4819 Other persistent atrial fibrillation: Secondary | ICD-10-CM

## 2023-06-18 DIAGNOSIS — I4891 Unspecified atrial fibrillation: Secondary | ICD-10-CM

## 2023-06-18 DIAGNOSIS — K279 Peptic ulcer, site unspecified, unspecified as acute or chronic, without hemorrhage or perforation: Secondary | ICD-10-CM

## 2023-06-18 DIAGNOSIS — G603 Idiopathic progressive neuropathy: Secondary | ICD-10-CM

## 2023-06-19 ENCOUNTER — Encounter: Payer: Self-pay | Admitting: Cardiology

## 2023-06-19 ENCOUNTER — Encounter: Payer: Self-pay | Admitting: Family Medicine

## 2023-06-19 MED ORDER — ATORVASTATIN CALCIUM 40 MG PO TABS: 40.0000 mg | ORAL_TABLET | Freq: Every day | ORAL | 2 refills | Status: DC

## 2023-06-19 MED ORDER — DULOXETINE HCL 30 MG PO CPEP: 30.0000 mg | ORAL_CAPSULE | Freq: Every day | ORAL | 2 refills | Status: DC

## 2023-06-19 MED ORDER — ALENDRONATE SODIUM 70 MG PO TABS: 70.0000 mg | ORAL_TABLET | ORAL | 3 refills | Status: DC

## 2023-06-19 MED ORDER — OMEPRAZOLE 40 MG PO CPDR
40.0000 mg | DELAYED_RELEASE_CAPSULE | Freq: Every day | ORAL | 2 refills | Status: DC
Start: 2023-06-19 — End: 2024-05-25

## 2023-06-19 MED ORDER — FUROSEMIDE 40 MG PO TABS: 40.0000 mg | ORAL_TABLET | Freq: Two times a day (BID) | ORAL | 1 refills | Status: DC

## 2023-06-19 MED ORDER — POTASSIUM CHLORIDE ER 20 MEQ PO TBCR: 1.0000 | EXTENDED_RELEASE_TABLET | Freq: Two times a day (BID) | ORAL | 2 refills | Status: DC

## 2023-06-22 ENCOUNTER — Other Ambulatory Visit: Payer: Self-pay | Admitting: Family Medicine

## 2023-06-25 ENCOUNTER — Ambulatory Visit: Payer: Medicare Other | Admitting: Family Medicine

## 2023-07-02 ENCOUNTER — Ambulatory Visit (INDEPENDENT_AMBULATORY_CARE_PROVIDER_SITE_OTHER): Payer: Medicare HMO | Admitting: Family Medicine

## 2023-07-02 ENCOUNTER — Encounter: Payer: Self-pay | Admitting: Family Medicine

## 2023-07-02 VITALS — BP 125/87 | HR 80 | Ht 62.0 in | Wt 295.0 lb

## 2023-07-02 DIAGNOSIS — E782 Mixed hyperlipidemia: Secondary | ICD-10-CM | POA: Diagnosis not present

## 2023-07-02 DIAGNOSIS — Z23 Encounter for immunization: Secondary | ICD-10-CM | POA: Diagnosis not present

## 2023-07-02 DIAGNOSIS — E1169 Type 2 diabetes mellitus with other specified complication: Secondary | ICD-10-CM | POA: Diagnosis not present

## 2023-07-02 DIAGNOSIS — B372 Candidiasis of skin and nail: Secondary | ICD-10-CM

## 2023-07-02 DIAGNOSIS — E669 Obesity, unspecified: Secondary | ICD-10-CM | POA: Diagnosis not present

## 2023-07-02 DIAGNOSIS — I5042 Chronic combined systolic (congestive) and diastolic (congestive) heart failure: Secondary | ICD-10-CM

## 2023-07-02 DIAGNOSIS — I480 Paroxysmal atrial fibrillation: Secondary | ICD-10-CM | POA: Diagnosis not present

## 2023-07-02 DIAGNOSIS — I10 Essential (primary) hypertension: Secondary | ICD-10-CM | POA: Diagnosis not present

## 2023-07-02 LAB — POCT GLYCOSYLATED HEMOGLOBIN (HGB A1C): Hemoglobin A1C: 6.2 % — AB (ref 4.0–5.6)

## 2023-07-02 NOTE — Assessment & Plan Note (Signed)
Continue Xarelto for anticoagulation and carvedilol for rate control.

## 2023-07-02 NOTE — Assessment & Plan Note (Signed)
Well controlled. Continue current regimen. Follow up in  6 mo  

## 2023-07-02 NOTE — Assessment & Plan Note (Addendum)
On patient assistance for Entresto.   Increase your furosemide to 2 tabs in the morning and 1 in the evening.  Weigh yourself starting tomorrow morning.  Once you are down 4 pounds total then you can go back to your regular regimen for your furosemide.  Please take an extra potassium tab as well.

## 2023-07-02 NOTE — Assessment & Plan Note (Addendum)
A1c is up a little bit today at 6.2 usually it is a little bit better than that but otherwise she is doing well.  Did not recommend any changes today continue to work on increased activity level and doing some chair exercises gave her some examples of things that she can work on.  Follow-up in 3 to 4 months.Foot exam performed today.

## 2023-07-02 NOTE — Patient Instructions (Signed)
Increase your furosemide to 2 tabs in the morning and 1 in the evening.  Weigh yourself starting tomorrow morning.  Once you are down 4 pounds total then you can go back to your regular regimen for your furosemide.  Please take an extra potassium tab as well.

## 2023-07-02 NOTE — Progress Notes (Signed)
Established Patient Office Visit  Subjective   Patient ID: Charlotte [name], female    DOB: 06-Jun-1936  Age: 87 y.o. MRN: [id number]  Chief Complaint  Patient presents with   Hypertension   Diabetes    HPI Hypertension- Pt denies chest pain, SOB, dizziness, or heart palpitations.  Taking meds as directed w/o problems.  Denies medication side effects.    Diabetes - no hypoglycemic events. No wounds or sores that are not healing well. No increased thirst or urination. Checking glucose at home. Taking medications as prescribed without any side effects.  Follow-up heart failure and her weight is actually up since Friday about 3 pounds on her home scale and about 4 pounds on our scale compared to her August weight.  Her daughter says that her weight is actually up about 15 pounds from a year ago.  She eats mostly veggies and protein she really does not eat a lot of carbs.  She has 1 Diet Coke a day.  Denies any recent onset swelling shortness of breath or wheezing.  No major dietary changes.   Still struggling with intermittent rash/yeast infection in the groin. Has a routing with powder and barrier ointments.  Took 2 tabs of diflucan this las time to resolve bad flare.   ROS    Objective:     BP 125/87   Pulse 80   Ht 5\' 2"  (1.575 m)   Wt 295 lb (133.8 kg)   SpO2 98%   BMI 53.96 kg/m    Physical Exam Vitals and nursing note reviewed.  Constitutional:      Appearance: Normal appearance.  HENT:     Head: Normocephalic and atraumatic.  Eyes:     Conjunctiva/sclera: Conjunctivae normal.  Cardiovascular:     Rate and Rhythm: Normal rate and regular rhythm.  Pulmonary:     Effort: Pulmonary effort is normal.     Breath sounds: Normal breath sounds.  Skin:    General: Skin is warm and dry.  Neurological:     Mental Status: She is alert.  Psychiatric:        Mood and Affect: Mood normal.      Results for orders placed or performed in visit on 07/02/23  POCT HgB A1C   Result Value Ref Range   Hemoglobin A1C 6.2 (A) 4.0 - 5.6 %   HbA1c POC (<> result, manual entry)     HbA1c, POC (prediabetic range)     HbA1c, POC (controlled diabetic range)        The ASCVD Risk score (Arnett DK, et al., 2019) failed to calculate for the following reasons:   The 2019 ASCVD risk score is only valid for ages 46 to 85   The patient has a prior MI or stroke diagnosis    Assessment & Plan:   Problem List Items Addressed This Visit       Cardiovascular and Mediastinum   Essential hypertension - Primary    Well controlled. Continue current regimen. Follow up in  6 mo       Chronic combined systolic and diastolic congestive heart failure (HCC)    On patient assistance for Entresto.   Increase your furosemide to 2 tabs in the morning and 1 in the evening.  Weigh yourself starting tomorrow morning.  Once you are down 4 pounds total then you can go back to your regular regimen for your furosemide.  Please take an extra potassium tab as well.      Atrial  fibrillation (HCC)    Continue Xarelto for anticoagulation and carvedilol for rate control.        Endocrine   Type 2 diabetes mellitus with obesity (HCC)    A1c is up a little bit today at 6.2 usually it is a little bit better than that but otherwise she is doing well.  Did not recommend any changes today continue to work on increased activity level and doing some chair exercises gave her some examples of things that she can work on.  Follow-up in 3 to 4 months.Foot exam performed today.        Musculoskeletal and Integument   Yeast infection of the skin   Other Visit Diagnoses     DM type 2 with diabetic mixed hyperlipidemia (HCC)       Relevant Orders   POCT HgB A1C (Completed)   Encounter for immunization       Relevant Orders   Flu Vaccine Trivalent High Dose (Fluad) (Completed)       Return in about 4 months (around 11/08/2023).    [name], MD

## 2023-07-04 ENCOUNTER — Other Ambulatory Visit: Payer: Self-pay

## 2023-07-04 MED ORDER — AMOXICILLIN 500 MG PO CAPS
500.0000 mg | ORAL_CAPSULE | Freq: Three times a day (TID) | ORAL | 0 refills | Status: DC
  Filled 2023-07-04: qty 30, 10d supply, fill #0

## 2023-07-09 ENCOUNTER — Encounter: Payer: Self-pay | Admitting: Cardiology

## 2023-07-10 ENCOUNTER — Telehealth: Payer: Self-pay | Admitting: *Deleted

## 2023-07-10 NOTE — Telephone Encounter (Signed)
   Pre-operative Risk Assessment    Patient Name: Charlotte [name]  DOB: May 12, 1936 MRN: [id number]  DATE OF LAST VISIT: 05/14/23 DR. CRENSHAW DATE OF NEXT VISIT: NONE   Request for Surgical Clearance    Procedure:  Dental Extraction - Amount of Teeth to be Pulled:  SURGICAL EXTRACTION OF 1 TOOTH (TOOTH # 14) WITH GROWTH FACTOR (GEM 21) ; WILL ALSO INVOLVE SOFT TISSUE FLAP TO BE ELEVATED AND BONE REMOVED  Date of Surgery:  Clearance TBD                                 Surgeon:  DR. Ihor Gully AND DR. DAMMLING Surgeon's Group or Practice Name:  [facility] Phone number:  [phone] Fax number:  [phone]   Type of Clearance Requested:   - Medical  - Pharmacy:  Hold Rivaroxaban (Xarelto) x 2 DAYS PRIOR   Type of Anesthesia:  Local  WITH EPI   Additional requests/questions:    Elpidio Anis   07/10/2023, 12:53 PM

## 2023-07-10 NOTE — Telephone Encounter (Signed)
Pharmacy please advise on holding Xarelto prior to extraction of tooth with soft tissue flap and bone removal scheduled for TBD. Thank you.

## 2023-07-10 NOTE — Telephone Encounter (Signed)
Patient with diagnosis of afib on Xarelto for anticoagulation.    Procedure: surgical extraction of 1 tooth with bone removal   Date of procedure: TBD  CHA2DS2-VASc Score = 9  This indicates a 12.2% annual risk of stroke. The patient's score is based upon: CHF History: 1 HTN History: 1 Diabetes History: 1 Stroke History: 2 Vascular Disease History: 1 Age Score: 2 Gender Score: 1   CrCl 28mL/min using adjusted body weight due to morbid obesity Platelet count 247K  Patient does not require pre-op antibiotics for dental procedure.  Recommend pt continue on anticoagulation for single dental extraction given elevated cardiovascular risk with a CHADS2VASc score of 9. If interruption is required, would limit to 1 day hold and resume as soon as safely possible after.  **This guidance is not considered finalized until pre-operative APP has relayed final recommendations.**

## 2023-07-10 NOTE — Telephone Encounter (Signed)
   Patient Name: Charlotte [name]  DOB: 1936/08/19 MRN: [id number]  Primary Cardiologist: Olga Millers, MD  Chart reviewed as part of pre-operative protocol coverage. Given past medical history and time since last visit, based on ACC/AHA guidelines, Charlotte [name] is at acceptable risk for the planned procedure without further cardiovascular testing.   Patient does not require pre-op antibiotics for dental procedure.   Recommend pt continue on anticoagulation for single dental extraction given elevated cardiovascular risk with a CHADS2VASc score of 9. If interruption is required, would limit to 1 day hold and resume as soon as safely possible after.   The patient was advised that if she develops new symptoms prior to surgery to contact our office to arrange for a follow-up visit, and she verbalized understanding.  I will route this recommendation to the requesting party via Epic fax function and remove from pre-op pool.  Please call with questions.  Napoleon Form, Leodis Rains, NP 07/10/2023, 3:57 PM

## 2023-07-16 DIAGNOSIS — H02831 Dermatochalasis of right upper eyelid: Secondary | ICD-10-CM | POA: Diagnosis not present

## 2023-07-16 DIAGNOSIS — H353131 Nonexudative age-related macular degeneration, bilateral, early dry stage: Secondary | ICD-10-CM | POA: Diagnosis not present

## 2023-07-16 DIAGNOSIS — H526 Other disorders of refraction: Secondary | ICD-10-CM | POA: Diagnosis not present

## 2023-07-16 DIAGNOSIS — E119 Type 2 diabetes mellitus without complications: Secondary | ICD-10-CM | POA: Diagnosis not present

## 2023-07-16 DIAGNOSIS — H43813 Vitreous degeneration, bilateral: Secondary | ICD-10-CM | POA: Diagnosis not present

## 2023-07-16 DIAGNOSIS — H02834 Dermatochalasis of left upper eyelid: Secondary | ICD-10-CM | POA: Diagnosis not present

## 2023-07-16 DIAGNOSIS — Z961 Presence of intraocular lens: Secondary | ICD-10-CM | POA: Diagnosis not present

## 2023-07-16 DIAGNOSIS — H04123 Dry eye syndrome of bilateral lacrimal glands: Secondary | ICD-10-CM | POA: Diagnosis not present

## 2023-07-16 LAB — HM DIABETES EYE EXAM

## 2023-07-23 ENCOUNTER — Encounter: Payer: Self-pay | Admitting: Cardiology

## 2023-07-23 MED ORDER — SPIRONOLACTONE 25 MG PO TABS: 12.5000 mg | ORAL_TABLET | Freq: Every day | ORAL | 3 refills | Status: DC

## 2023-07-23 MED ORDER — SACUBITRIL-VALSARTAN 24-26 MG PO TABS: 1.0000 | ORAL_TABLET | Freq: Two times a day (BID) | ORAL | 3 refills | Status: DC

## 2023-08-13 ENCOUNTER — Ambulatory Visit: Payer: Medicare Other | Admitting: Family Medicine

## 2023-08-21 ENCOUNTER — Other Ambulatory Visit: Payer: Self-pay

## 2023-08-21 DIAGNOSIS — I4891 Unspecified atrial fibrillation: Secondary | ICD-10-CM

## 2023-08-21 MED ORDER — RIVAROXABAN 20 MG PO TABS
20.0000 mg | ORAL_TABLET | Freq: Every day | ORAL | 1 refills | Status: DC
Start: 2023-08-21 — End: 2023-08-22

## 2023-08-21 NOTE — Telephone Encounter (Signed)
Prescription refill request for Xarelto received.  Indication: Afib  Last office visit: 05/14/23 Charlotte [name])  Weight: 133.8kg Age: 87 Scr: 0.91 (05/14/23)  CrCl: 95.46ml/min  Appropriate dose. Refill sent.

## 2023-08-22 ENCOUNTER — Telehealth: Payer: Self-pay | Admitting: Cardiology

## 2023-08-22 DIAGNOSIS — I4891 Unspecified atrial fibrillation: Secondary | ICD-10-CM

## 2023-08-22 MED ORDER — RIVAROXABAN 20 MG PO TABS: 20.0000 mg | ORAL_TABLET | Freq: Every day | ORAL | 1 refills | Status: DC

## 2023-08-22 NOTE — Telephone Encounter (Signed)
Prescription refill request for Xarelto received.  Indication: AF Last office visit: 05/14/23  Velta Addison MD Weight: 131.8kg Age: 87 Scr: 0.91 on 05/14/23  Epic CrCl: 94.04  Based on above findings Xarelto 20mg  daily is the appropriate dose.  Refill approved.

## 2023-08-22 NOTE — Telephone Encounter (Signed)
*  STAT* If patient is at the pharmacy, call can be transferred to refill team.   1. Which medications need to be refilled? (please list name of each medication and dose if known)   rivaroxaban (XARELTO) 20 MG TABS tablet    2. Which pharmacy/location (including street and city if local pharmacy) is medication to be sent to? [facility] -  Phone#: [phone]   Fax#: [phone]  3. Do they need a 30 day or 90 day supply?   90 day supply  Beth with [facility] is requesting to have the medication transferred to their pharmacy.

## 2023-09-07 ENCOUNTER — Other Ambulatory Visit: Payer: Self-pay | Admitting: Family Medicine

## 2023-09-07 DIAGNOSIS — B372 Candidiasis of skin and nail: Secondary | ICD-10-CM

## 2023-09-12 ENCOUNTER — Encounter: Payer: Self-pay | Admitting: *Deleted

## 2023-09-13 ENCOUNTER — Encounter: Payer: Self-pay | Admitting: *Deleted

## 2023-09-30 ENCOUNTER — Encounter: Payer: Self-pay | Admitting: Family Medicine

## 2023-09-30 DIAGNOSIS — K649 Unspecified hemorrhoids: Secondary | ICD-10-CM

## 2023-09-30 MED ORDER — TRIAMCINOLONE ACETONIDE 0.1 % EX CREA: TOPICAL_CREAM | Freq: Two times a day (BID) | CUTANEOUS | 1 refills | Status: DC

## 2023-10-20 ENCOUNTER — Encounter: Payer: Self-pay | Admitting: Cardiology

## 2023-10-20 ENCOUNTER — Encounter: Payer: Self-pay | Admitting: Family Medicine

## 2023-10-20 DIAGNOSIS — J3489 Other specified disorders of nose and nasal sinuses: Secondary | ICD-10-CM

## 2023-10-20 DIAGNOSIS — I4891 Unspecified atrial fibrillation: Secondary | ICD-10-CM

## 2023-10-20 DIAGNOSIS — G603 Idiopathic progressive neuropathy: Secondary | ICD-10-CM

## 2023-10-21 MED ORDER — SACUBITRIL-VALSARTAN 24-26 MG PO TABS: 1.0000 | ORAL_TABLET | Freq: Two times a day (BID) | ORAL | 3 refills | Status: DC

## 2023-10-21 MED ORDER — SPIRONOLACTONE 25 MG PO TABS: 12.5000 mg | ORAL_TABLET | Freq: Every day | ORAL | 3 refills | Status: DC

## 2023-10-21 MED ORDER — DULOXETINE HCL 30 MG PO CPEP: 30.0000 mg | ORAL_CAPSULE | Freq: Every day | ORAL | 0 refills | Status: DC

## 2023-10-21 MED ORDER — FUROSEMIDE 40 MG PO TABS: 40.0000 mg | ORAL_TABLET | Freq: Two times a day (BID) | ORAL | 0 refills | Status: DC

## 2023-10-21 MED ORDER — ATORVASTATIN CALCIUM 40 MG PO TABS: 40.0000 mg | ORAL_TABLET | Freq: Every day | ORAL | 0 refills | Status: DC

## 2023-10-21 MED ORDER — ALENDRONATE SODIUM 70 MG PO TABS: 70.0000 mg | ORAL_TABLET | ORAL | 3 refills | Status: DC

## 2023-10-21 MED ORDER — FLUTICASONE PROPIONATE 50 MCG/ACT NA SUSP: 1.0000 | Freq: Every day | NASAL | 0 refills | Status: AC

## 2023-10-22 ENCOUNTER — Other Ambulatory Visit: Payer: Self-pay | Admitting: Cardiology

## 2023-10-22 MED ORDER — RIVAROXABAN 20 MG PO TABS: 20.0000 mg | ORAL_TABLET | Freq: Every day | ORAL | 1 refills | Status: DC

## 2023-10-23 MED ORDER — SACUBITRIL-VALSARTAN 24-26 MG PO TABS: 1.0000 | ORAL_TABLET | Freq: Two times a day (BID) | ORAL | 2 refills | Status: DC

## 2023-11-01 ENCOUNTER — Other Ambulatory Visit: Payer: Self-pay

## 2023-11-01 MED ORDER — AMOXICILLIN 875 MG PO TABS
875.0000 mg | ORAL_TABLET | Freq: Two times a day (BID) | ORAL | 0 refills | Status: DC
  Filled 2023-11-01: qty 20, 10d supply, fill #0

## 2023-11-05 ENCOUNTER — Encounter: Payer: Self-pay | Admitting: Family Medicine

## 2023-11-05 ENCOUNTER — Ambulatory Visit (INDEPENDENT_AMBULATORY_CARE_PROVIDER_SITE_OTHER): Payer: Medicare Other | Admitting: Family Medicine

## 2023-11-05 VITALS — BP 119/65 | HR 85 | Ht 62.0 in | Wt 282.0 lb

## 2023-11-05 DIAGNOSIS — E1169 Type 2 diabetes mellitus with other specified complication: Secondary | ICD-10-CM

## 2023-11-05 DIAGNOSIS — Z23 Encounter for immunization: Secondary | ICD-10-CM | POA: Diagnosis not present

## 2023-11-05 DIAGNOSIS — E782 Mixed hyperlipidemia: Secondary | ICD-10-CM

## 2023-11-05 DIAGNOSIS — I10 Essential (primary) hypertension: Secondary | ICD-10-CM

## 2023-11-05 DIAGNOSIS — M8000XS Age-related osteoporosis with current pathological fracture, unspecified site, sequela: Secondary | ICD-10-CM | POA: Diagnosis not present

## 2023-11-05 DIAGNOSIS — I5043 Acute on chronic combined systolic (congestive) and diastolic (congestive) heart failure: Secondary | ICD-10-CM | POA: Diagnosis not present

## 2023-11-05 DIAGNOSIS — E559 Vitamin D deficiency, unspecified: Secondary | ICD-10-CM | POA: Diagnosis not present

## 2023-11-05 DIAGNOSIS — E669 Obesity, unspecified: Secondary | ICD-10-CM

## 2023-11-05 LAB — POCT GLYCOSYLATED HEMOGLOBIN (HGB A1C): Hemoglobin A1C: 6.2 % — AB (ref 4.0–5.6)

## 2023-11-05 NOTE — Assessment & Plan Note (Signed)
Well controlled. Continue current regimen. Follow up in  6 months.

## 2023-11-05 NOTE — Assessment & Plan Note (Signed)
Charlotte [name] has been well-controlled on her current regimen of furosemide she says she does not always take it every day.

## 2023-11-05 NOTE — Progress Notes (Signed)
Established Patient Office Visit  Subjective  Patient ID: Charlotte [name], female    DOB: [birth year]  Age: 88 y.o. MRN: [id number]  Chief Complaint  Patient presents with   Diabetes   Hypertension    HPI  She underwent a root canal recently unfortunately it took 3 visits over 2 weeks but it is completed she was recently on amoxicillin because of that and says that now she is feeling a lot better her pain is also under better control.    ROS    Objective:     BP 119/65   Pulse 85   Ht 5\' 2"  (1.575 m)   Wt 282 lb (127.9 kg)   SpO2 98%   BMI 51.58 kg/m    Physical Exam Vitals and nursing note reviewed.  Constitutional:      Appearance: Normal appearance.  HENT:     Head: Normocephalic and atraumatic.  Eyes:     Conjunctiva/sclera: Conjunctivae normal.  Cardiovascular:     Rate and Rhythm: Normal rate and regular rhythm.  Pulmonary:     Effort: Pulmonary effort is normal.     Breath sounds: Normal breath sounds.  Skin:    General: Skin is warm and dry.  Neurological:     Mental Status: She is alert.  Psychiatric:        Mood and Affect: Mood normal.      Results for orders placed or performed in visit on 11/05/23  POCT HgB A1C  Result Value Ref Range   Hemoglobin A1C 6.2 (A) 4.0 - 5.6 %   HbA1c POC (<> result, manual entry)     HbA1c, POC (prediabetic range)     HbA1c, POC (controlled diabetic range)        The ASCVD Risk score (Arnett DK, et al., 2019) failed to calculate for the following reasons:   The 2019 ASCVD risk score is only valid for ages 81 to 40   Risk score cannot be calculated because patient has a medical history suggesting prior/existing ASCVD    Assessment & Plan:   Problem List Items Addressed This Visit       Cardiovascular and Mediastinum   Essential hypertension   Well controlled. Continue current regimen. Follow up in  6 months       Relevant Orders   CMP14+EGFR   Lipid panel   CBC   VITAMIN D 25 Hydroxy (Vit-D  Deficiency, Fractures)   CHF (congestive heart failure) (HCC)   Welling has been well-controlled on her current regimen of furosemide she says she does not always take it every day.        Endocrine   Type 2 diabetes mellitus with obesity (HCC)   1C looks fantastic today at 6.2 which is stable from previous in September continue to work on [employer] and regular exercise.  She is actually down 13 pounds since I last saw her which is fantastic.  Again continue to work on healthy changes.        Musculoskeletal and Integument   Osteoporosis   For updated bone density.  She has been tolerating the fax maxwell she has been on it for less than a year at this point.      Relevant Orders   DG Bone Density   Other Visit Diagnoses       DM type 2 with diabetic mixed hyperlipidemia (HCC)    -  Primary   Relevant Orders   POCT HgB A1C (Completed)   CMP14+EGFR  Lipid panel   CBC   VITAMIN D 25 Hydroxy (Vit-D Deficiency, Fractures)     Encounter for immunization       Relevant Orders   Pfizer Comirnaty Covid-19 Vaccine 71yrs & older (Completed)       Return in about 4 months (around 03/04/2024).    [name], MD

## 2023-11-05 NOTE — Assessment & Plan Note (Signed)
For updated bone density.  She has been tolerating the fax maxwell she has been on it for less than a year at this point.

## 2023-11-05 NOTE — Assessment & Plan Note (Signed)
1C looks fantastic today at 6.2 which is stable from previous in September continue to work on [employer] and regular exercise.  She is actually down 13 pounds since I last saw her which is fantastic.  Again continue to work on healthy changes.

## 2023-11-06 ENCOUNTER — Encounter: Payer: Self-pay | Admitting: Family Medicine

## 2023-11-06 LAB — CBC
Hematocrit: 41.6 % (ref 34.0–46.6)
Hemoglobin: 14 g/dL (ref 11.1–15.9)
MCH: 30.8 pg (ref 26.6–33.0)
MCHC: 33.7 g/dL (ref 31.5–35.7)
MCV: 92 fL (ref 79–97)
Platelets: 248 10*3/uL (ref 150–450)
RBC: 4.54 x10E6/uL (ref 3.77–5.28)
RDW: 13 % (ref 11.7–15.4)
WBC: 6.9 10*3/uL (ref 3.4–10.8)

## 2023-11-06 LAB — CMP14+EGFR
ALT: 18 [IU]/L (ref 0–32)
AST: 22 [IU]/L (ref 0–40)
Albumin: 4 g/dL (ref 3.7–4.7)
Alkaline Phosphatase: 90 [IU]/L (ref 44–121)
BUN/Creatinine Ratio: 21 (ref 12–28)
BUN: 20 mg/dL (ref 8–27)
Bilirubin Total: 0.6 mg/dL (ref 0.0–1.2)
CO2: 20 mmol/L (ref 20–29)
Calcium: 9.1 mg/dL (ref 8.7–10.3)
Chloride: 100 mmol/L (ref 96–106)
Creatinine, Ser: 0.96 mg/dL (ref 0.57–1.00)
Globulin, Total: 3.2 g/dL (ref 1.5–4.5)
Glucose: 99 mg/dL (ref 70–99)
Potassium: 4.6 mmol/L (ref 3.5–5.2)
Sodium: 138 mmol/L (ref 134–144)
Total Protein: 7.2 g/dL (ref 6.0–8.5)
eGFR: 58 mL/min/{1.73_m2} — ABNORMAL LOW (ref >59)

## 2023-11-06 LAB — VITAMIN D 25 HYDROXY (VIT D DEFICIENCY, FRACTURES): Vit D, 25-Hydroxy: 32.5 ng/mL (ref 30.0–100.0)

## 2023-11-06 LAB — LIPID PANEL
Chol/HDL Ratio: 3 {ratio} (ref 0.0–4.4)
Cholesterol, Total: 127 mg/dL (ref 100–199)
HDL: 42 mg/dL (ref >39)
LDL Chol Calc (NIH): 68 mg/dL (ref 0–99)
Triglycerides: 87 mg/dL (ref 0–149)
VLDL Cholesterol Cal: 17 mg/dL (ref 5–40)

## 2023-11-06 NOTE — Progress Notes (Signed)
Hi Charlotte [name],  Kidney and liver function look good.  Cholesterol looks great.  Blood count is normal.  Vitamin D has dropped a little bit compared to last year so just make sure you are still taking your 25 mcg daily especially through the fall winter and spring months.

## 2023-11-11 NOTE — Progress Notes (Signed)
 Office Visit    Patient Name: Charlotte [name] Date of Encounter: 11/12/2023  Primary Care Provider:  Alvan Dorothyann BIRCH, MD Primary Cardiologist:  Redell Shallow, MD  Chief Complaint    88 year old female with a history of nonobstructive CAD, permanent atrial fibrillation, chronic combined systolic and diastolic heart failure, aortic valve regurgitation, hypertension, hyperlipidemia, OSA on CPAP, CVA, GERD, GI bleed, and OA who presents for follow-up related to atrial fibrillation and heart failure.   Past Medical History    Past Medical History:  Diagnosis Date   Aortic insufficiency    Atrial fibrillation (HCC)    a. s/p DCCV 03/2013   CAD (coronary artery disease)    LHC 06/2010: EF 55%, mild plaque in the LAD 20-30%, otherwise normal coronary arteries   CATARACTS    Chronic systolic heart failure (HCC)    in setting of AFib;  Echocardiogram 02/2013: Moderate LVH, EF 25-30%, anteroseptal and apical HK, moderate AI, MAC, moderate MR, moderate LAE, mild RAE, PASP 32 => after DCCV f/u Echo 8/14: Moderate LVH, EF 60-65%, normal wall motion, grade 1 diastolic dysfunction, moderate AI, moderate LAE   Complication of anesthesia 2009   nausea and vomitting   GERD (gastroesophageal reflux disease)    HYPERLIPIDEMIA, MILD    HYPERTENSION, BENIGN SYSTEMIC    Irritable bowel syndrome    LBBB (left bundle branch block)    MEDIAL EPICONDYLITIS    NSVT (nonsustained ventricular tachycardia) (HCC)    during Dob Echo 2011 => normal cath   OSTEOARTHRITIS    OSTEOPENIA    PONV (postoperative nausea and vomiting)    Pre-diabetes    RHINITIS, ALLERGIC    Stroke (HCC)    SYNCOPE    VITAMIN D  DEFICIENCY    Past Surgical History:  Procedure Laterality Date   BIOPSY  03/01/2020   Procedure: BIOPSY;  Surgeon: Elicia Claw, MD;  Location: [facility] ENDOSCOPY;  Service: Gastroenterology;;   CARDIOVERSION N/A 04/01/2013   Procedure: CARDIOVERSION;  Surgeon: Maude JAYSON Emmer, MD;  Location: [facility]  ENDOSCOPY;  Service: Cardiovascular;  Laterality: N/A;   CARDIOVERSION N/A 07/16/2017   Procedure: CARDIOVERSION;  Surgeon: Mona Vinie JAYSON, MD;  Location: [facility] ENDOSCOPY;  Service: Cardiovascular;  Laterality: N/A;   CARDIOVERSION N/A 07/07/2018   Procedure: CARDIOVERSION;  Surgeon: Mona Vinie JAYSON, MD;  Location: [facility] ENDOSCOPY;  Service: Cardiovascular;  Laterality: N/A;   CATARACT EXTRACTION, BILATERAL  2012   Dr. Cleatus   COLONOSCOPY WITH PROPOFOL  N/A 05/23/2017   Procedure: COLONOSCOPY WITH PROPOFOL ;  Surgeon: Lennard Lesta FALCON, MD;  Location: [facility] ENDOSCOPY;  Service: Endoscopy;  Laterality: N/A;   ESOPHAGOGASTRODUODENOSCOPY N/A 03/01/2020   Procedure: ESOPHAGOGASTRODUODENOSCOPY (EGD);  Surgeon: Elicia Claw, MD;  Location: [facility] ENDOSCOPY;  Service: Gastroenterology;  Laterality: N/A;   ESOPHAGOGASTRODUODENOSCOPY (EGD) WITH PROPOFOL  N/A 05/23/2017   Procedure: ESOPHAGOGASTRODUODENOSCOPY (EGD) WITH PROPOFOL ;  Surgeon: Lennard Lesta FALCON, MD;  Location: [facility] ENDOSCOPY;  Service: Endoscopy;  Laterality: N/A;   IR KYPHO LUMBAR INC FX REDUCE BONE BX UNI/BIL CANNULATION INC/IMAGING  06/06/2017   IR RADIOLOGIST EVAL & MGMT  07/01/2017   RADICAL HYSTERECTOMY     REPLACEMENT TOTAL KNEE     11/03/07 right....... 03/29/08 left    Allergies  Allergies  Allergen Reactions   Metformin  And Related Other (See Comments)    Renal failure   Amlodipine Other (See Comments)    HEADACHE     Labs/Other Studies Reviewed    The following studies were reviewed today:  Cardiac Studies & Procedures  STRESS TESTS  MYOCARDIAL PERFUSION IMAGING 05/22/2018  Narrative  Nuclear stress EF: 48%.  There was no ST segment deviation noted during stress.  The left ventricular ejection fraction is mildly decreased (45-54%).  This is a low risk study.  1. EF 48%, septal hypokinesis. 2. Fixed small, mild mid to apical anteroseptal perfusion defect.  I suspect the septal hypokinesis and the fixed septal defect are  due to LBBB.  There is no ischemia.  Low risk study.  ECHOCARDIOGRAM  ECHOCARDIOGRAM COMPLETE 06/04/2023  Narrative ECHOCARDIOGRAM REPORT    Patient Name:   Charlotte [name]  Date of Exam: 06/04/2023 Medical Rec #:  [id number]     Height:       62.0 in Accession #:    [id number]    Weight:       290.6 lb Date of Birth:  1936/06/18      BSA:          2.241 m Patient Age:    88 years      BP:           116/78 mmHg Patient Gender: F             HR:           98 bpm. Exam Location:  [address]  Procedure: 2D Echo, Cardiac Doppler, Color Doppler and Intracardiac Opacification Agent  Indications:    Cardiomyopathy-Unspecified I42.9  History:        Patient has prior history of Echocardiogram examinations, most recent 06/26/2022. CHF, CAD, Arrythmias:Atrial Fibrillation and LBBB; Risk Factors:Hypertension and Dyslipidemia.  Sonographer:    Augustin Seals RDCS Referring Phys: [id number] BRIAN S CRENSHAW   Sonographer Comments: Technically difficult study due to poor echo windows. IMPRESSIONS   1. Left ventricular ejection fraction, by estimation, is 30 to 35%. The left ventricle has moderately decreased function. The left ventricle demonstrates global hypokinesis. Left ventricular diastolic parameters are indeterminate. 2. Right ventricular systolic function was not well visualized. The right ventricular size is normal. 3. Left atrial size was moderately dilated. 4. The mitral valve is grossly normal. Trivial mitral valve regurgitation. No evidence of mitral stenosis. 5. The aortic valve is grossly normal. There is mild calcification of the aortic valve. There is mild thickening of the aortic valve. Aortic valve regurgitation is trivial. Aortic valve sclerosis/calcification is present, without any evidence of aortic stenosis. 6. Aortic dilatation noted. There is borderline dilatation of the ascending aorta, measuring 38 mm.  Comparison(s): No significant change from prior  study.  FINDINGS Left Ventricle: Prominent LV dyssynchrony. Left ventricular ejection fraction, by estimation, is 30 to 35%. The left ventricle has moderately decreased function. The left ventricle demonstrates global hypokinesis. Definity  contrast agent was given IV to delineate the left ventricular endocardial borders. The left ventricular internal cavity size was normal in size. Suboptimal image quality limits for assessment of left ventricular hypertrophy. Abnormal (paradoxical) septal motion, consistent with left bundle branch block. Left ventricular diastolic parameters are indeterminate.  Right Ventricle: The right ventricular size is normal. Right vetricular wall thickness was not well visualized. Right ventricular systolic function was not well visualized.  Left Atrium: Left atrial size was moderately dilated.  Right Atrium: Right atrial size was normal in size.  Pericardium: There is no evidence of pericardial effusion. Presence of epicardial fat layer.  Mitral Valve: The mitral valve is grossly normal. Trivial mitral valve regurgitation. No evidence of mitral valve stenosis.  Tricuspid Valve: The tricuspid valve is grossly normal. Tricuspid valve regurgitation is  trivial. No evidence of tricuspid stenosis.  Aortic Valve: The aortic valve is grossly normal. There is mild calcification of the aortic valve. There is mild thickening of the aortic valve. Aortic valve regurgitation is trivial. Aortic valve sclerosis/calcification is present, without any evidence of aortic stenosis.  Pulmonic Valve: The pulmonic valve was not well visualized. Pulmonic valve regurgitation is not visualized.  Aorta: Aortic dilatation noted. There is borderline dilatation of the ascending aorta, measuring 38 mm.  Venous: The inferior vena cava was not well visualized.  IAS/Shunts: The interatrial septum was not well visualized.   LEFT VENTRICLE PLAX 2D LVIDd:         5.10 cm LVIDs:         4.20  cm LV PW:         1.50 cm LV IVS:        1.00 cm LVOT diam:     2.20 cm LV SV:         49 LV SV Index:   22 LVOT Area:     3.80 cm   RIGHT VENTRICLE RV Basal diam:  2.60 cm RV Mid diam:    2.20 cm RV S prime:     7.22 cm/s  LEFT ATRIUM             Index        RIGHT ATRIUM           Index LA diam:        4.40 cm 1.96 cm/m   RA Area:     14.00 cm LA Vol (A2C):   76.7 ml 34.23 ml/m  RA Volume:   25.60 ml  11.43 ml/m LA Vol (A4C):   89.4 ml 39.90 ml/m LA Biplane Vol: 87.8 ml 39.18 ml/m AORTIC VALVE LVOT Vmax:   67.40 cm/s LVOT Vmean:  41.800 cm/s LVOT VTI:    0.129 m  AORTA Ao Root diam: 3.20 cm Ao Asc diam:  3.80 cm   SHUNTS Systemic VTI:  0.13 m Systemic Diam: 2.20 cm  Shelda Bruckner MD Electronically signed by Shelda Bruckner MD Signature Date/Time: 06/04/2023/5:38:01 PM    Final   MONITORS  LONG TERM MONITOR (3-14 DAYS) 03/28/2022  Narrative Patch Randol Time:  3 days and 5 hours (2023-06-11T12:15:38-0400 to 2023-06-14T17:49:00-0400)  Atrial Fibrillation occurred continuously (100% burden), ranging from 51-173 bpm (avg of 90 bpm). Bundle Branch Block/IVCD was present. QRS morphology changes were present throughout recording. Isolated VEs were rare (<1.0%), and no VE Couplets or VE Triplets were present.  Atrial fibrillation with PVCs or aberrantly conducted beats; rate controlled. Redell Shallow          Recent Labs: 11/05/2023: ALT 18; BUN 20; Creatinine, Ser 0.96; Hemoglobin 14.0; Platelets 248; Potassium 4.6; Sodium 138  Recent Lipid Panel    Component Value Date/Time   CHOL 127 11/05/2023 1506   TRIG 87 11/05/2023 1506   TRIG 112 06/26/2006 0000   HDL 42 11/05/2023 1506   CHOLHDL 3.0 11/05/2023 1506   CHOLHDL 2.8 01/31/2021 0000   VLDL 15 03/01/2017 1148   LDLCALC 68 11/05/2023 1506   LDLCALC 67 01/31/2021 0000    History of Present Illness     88 year old female with the above past medical history including nonobstructive  CAD, permanent atrial fibrillation, chronic combined systolic and diastolic heart failure, aortic valve regurgitation, hypertension, hyperlipidemia, OSA on CPAP, CVA, GERD, GI bleed, and OA.   Cardiac catheterization in September 2011 showed mild plaque in the LAD (20 to 30% stenosis).  She has a history of previous cardiomyopathy which improved.  Nuclear study in August 2019 showed EF 40%, fixed anteroseptal defect felt to be secondary to left bundle branch block, no ischemia.  Echocardiogram in December 2020 showed normal LV function, mild LVH, G1 DD, mild aortic insufficiency.  Additionally, she has a history of atrial fibrillation, s/p DCCV with recurrence, on Xarelto .  She was hospitalized in May 2021 in the setting of GI bleed and syncope.  EGD showed multiple clean-base superficial gastric ulcers. Xarelto  was briefly held. Outpatient cardiac monitor in December 2022 showed atrial fibrillation with elevated rate at times.  She was hospitalized in May 2023 in the setting of acute on chronic systolic heart failure, atrial fibrillation with RVR, UTI, delirium, and fall. Echocardiogram in May 2023 showed EF 30%, mild left atrial enlargement, trace aortic insufficiency.  Her worsening cardiomyopathy/HFrEF was thought to be possibly tachy-mediated.  Repeat cardiac monitor in June 2023 showed continuous atrial fibrillation, average heart rate 90 bpm. Echocardiogram in 06/2022 showed EF 30 to 35%, LV dyssynchrony secondary to LBBB, moderately decreased LV function, LV global hypokinesis, indeterminate diastolic parameters, normal RV systolic function, mild aortic valve regurgitation, unchanged from prior. She was last seen in the office on 05/14/2023 and was stable from a cardiac standpoint.  He noted stable chronic bilateral pedal edema, she denied symptoms concerning for angina.  Repeat echocardiogram in 05/2023 showed EF 30 to 35%, moderately decreased LV function, normal RV systolic function, no significant valvular  abnormalities, mild dilation of the ascending aorta measuring 38 mm, no significant change from prior study.   She presents today for follow-up accompanied by her daughter. Since her last visit she has done well from a cardiac standpoint.  He has stable nonpitting bilateral lower extremity edema, overall improved with current medication regimen.  She denies any symptoms concerning for angina, denies palpitations, dizziness, dyspnea, PND, orthopnea, weight gain.  Overall, she reports feeling well.  Home Medications    Current Outpatient Medications  Medication Sig Dispense Refill   acetaminophen  (TYLENOL ) 500 MG tablet Take 1,000 mg by mouth in the morning and at bedtime.     alendronate  (FOSAMAX ) 70 MG tablet Take 1 tablet (70 mg total) by mouth every 7 (seven) days. Take with a full glass of water on an empty stomach. 12 tablet 3   atorvastatin  (LIPITOR) 40 MG tablet Take 1 tablet (40 mg total) by mouth daily. 100 tablet 0   carvedilol  (COREG ) 12.5 MG tablet Take 1 tablet (12.5 mg total) by mouth 2 (two) times daily with a meal.     DULoxetine  (CYMBALTA ) 30 MG capsule Take 1 capsule (30 mg total) by mouth daily. 100 capsule 0   Ferrous Sulfate  (IRON  PO) Take 1 tablet by mouth every other day.     fluticasone  (FLONASE ) 50 MCG/ACT nasal spray Place 1 spray into both nostrils daily. 48 g 0   furosemide  (LASIX ) 40 MG tablet Take 1 tablet (40 mg total) by mouth 2 (two) times daily. 200 tablet 0   Multiple Vitamins-Minerals (SYSTANE ICAPS AREDS2) CAPS Take 1 capsule by mouth daily.     nystatin  (MYCOSTATIN /NYSTOP ) powder APPLY TOPICALLY 3 TIMES A DAY 60 g 3   nystatin  cream (MYCOSTATIN ) APPLY TO AFFECTED AREA TWICE A DAY 60 g 4   omeprazole  (PRILOSEC) 40 MG capsule Take 1 capsule (40 mg total) by mouth daily. 200 capsule 2   Potassium Chloride  ER 20 MEQ TBCR Take 1 tablet (20 mEq total) by mouth 2 (two) times daily.  200 tablet 2   rivaroxaban  (XARELTO ) 20 MG TABS tablet Take 1 tablet (20 mg total) by  mouth daily with supper. 90 tablet 1   sacubitril -valsartan  (ENTRESTO ) 24-26 MG Take 1 tablet by mouth 2 (two) times daily. 180 tablet 2   spironolactone  (ALDACTONE ) 25 MG tablet Take 0.5 tablets (12.5 mg total) by mouth daily. 45 tablet 3   triamcinolone  cream (KENALOG ) 0.1 % Apply topically 2 (two) times daily. 30 g 1   No current facility-administered medications for this visit.     Review of Systems    She denies chest pain, palpitations, dyspnea, pnd, orthopnea, n, v, dizziness, syncope, weight gain, or early satiety. All other systems reviewed and are otherwise negative except as noted above.   Physical Exam    VS:  BP 112/74   Pulse 77   Ht 5' 3 (1.6 m)   Wt 291 lb (132 kg)   SpO2 94%   BMI 51.55 kg/m  GEN: Well nourished, well developed, in no acute distress. HEENT: normal. Neck: Supple, no JVD, carotid bruits, or masses. Cardiac: RRR, no murmurs, rubs, or gallops. No clubbing, cyanosis, nonpitting bilateral ankle edema.  Radials/DP/PT 2+ and equal bilaterally.  Respiratory:  Respirations regular and unlabored, clear to auscultation bilaterally. GI: Soft, nontender, nondistended, BS + x 4. MS: no deformity or atrophy. Skin: warm and dry, no rash. Neuro:  Strength and sensation are intact. Psych: Normal affect.  Accessory Clinical Findings    ECG personally reviewed by me today - EKG Interpretation Date/Time:  Tuesday November 12 2023 13:44:20 EST Ventricular Rate:  77 PR Interval:    QRS Duration:  150 QT Interval:  378 QTC Calculation: 427 R Axis:   -54  Text Interpretation: Atrial fibrillation Left axis deviation Left bundle branch block When compared with ECG of 12-Nov-2023 13:43, No significant change was found Confirmed by Daneen Perkins ([id number]) on 11/12/2023 1:50:37 PM  - no acute changes.   Lab Results  Component Value Date   WBC 6.9 11/05/2023   HGB 14.0 11/05/2023   HCT 41.6 11/05/2023   MCV 92 11/05/2023   PLT 248 11/05/2023   Lab Results   Component Value Date   CREATININE 0.96 11/05/2023   BUN 20 11/05/2023   NA 138 11/05/2023   K 4.6 11/05/2023   CL 100 11/05/2023   CO2 20 11/05/2023   Lab Results  Component Value Date   ALT 18 11/05/2023   AST 22 11/05/2023   ALKPHOS 90 11/05/2023   BILITOT 0.6 11/05/2023   Lab Results  Component Value Date   CHOL 127 11/05/2023   HDL 42 11/05/2023   LDLCALC 68 11/05/2023   TRIG 87 11/05/2023   CHOLHDL 3.0 11/05/2023    Lab Results  Component Value Date   HGBA1C 6.2 (A) 11/05/2023    Assessment & Plan    1. Permanent atrial fibrillation: Rate controlled.  Denies bleeding.  Recent labs (CBC, CMET) were stable.  Continue carvedilol , Xarelto .   2. Chronic combined systolic and diastolic heart failure: Echo in May 2023 showed EF 30%, mild left atrial enlargement, trace aortic insufficiency. Her worsening cardiomyopathy/HFrEF was thought to be possibly tachy-mediated.  Repeat echocardiogram in 05/2023 showed EF 30 to 35%, moderately decreased LV function, normal RV systolic function, no significant valvular abnormalities, mild dilation of the ascending aorta measuring 38 mm, no significant change from prior study. Symptomatically, patient has improved.  She does have some ongoing nonpitting lower extremity edema, improved.  Stable chronic dyspnea on exertion.  Otherwise, euvolemic and well compensated on exam. Further escalation of GDMT limited in the setting of soft BP. Additionally, given sedentary lifestyle and obesity, I do not think she would be a good candidate for SGLT2 inhibitor given high risk of infection. Additionally, given her advanced age and comorbidities, we have not referred her for a biventricular ICD.  Continue carvedilol , Entresto , spironolactone , and Lasix .   3. Nonobstructive CAD: Cath in September 2011 showed mild plaque in the LAD (20 to 30% stenosis).  Normal stress test in 2019.  Stable with no anginal symptoms. Continue current medications as above, continue  Lipitor.   4. Aortic valve regurgitation: Trivial on most recent echo.     5. Hypertension: BP borderline soft.  Asymptomatic.  Continue current antihypertensive regimen.   6. Hyperlipidemia: LDL was 68 in 10/2023.  Continue Lipitor.   7. OSA: On CPAP.  Denies any concerns.   8. H/o CVA: On chronic Xarelto .  No recurrence.   9. Disposition: Follow-up in 6 months with Dr. Pietro.       Damien JAYSON Braver, NP 11/12/2023, 1:50 PM

## 2023-11-12 ENCOUNTER — Encounter: Payer: Self-pay | Admitting: Nurse Practitioner

## 2023-11-12 ENCOUNTER — Ambulatory Visit: Payer: Medicare Other | Attending: Nurse Practitioner | Admitting: Nurse Practitioner

## 2023-11-12 VITALS — BP 112/74 | HR 77 | Ht 63.0 in | Wt 291.0 lb

## 2023-11-12 DIAGNOSIS — I2510 Atherosclerotic heart disease of native coronary artery without angina pectoris: Secondary | ICD-10-CM | POA: Diagnosis not present

## 2023-11-12 DIAGNOSIS — I10 Essential (primary) hypertension: Secondary | ICD-10-CM | POA: Diagnosis not present

## 2023-11-12 DIAGNOSIS — I351 Nonrheumatic aortic (valve) insufficiency: Secondary | ICD-10-CM | POA: Diagnosis not present

## 2023-11-12 DIAGNOSIS — G4733 Obstructive sleep apnea (adult) (pediatric): Secondary | ICD-10-CM

## 2023-11-12 DIAGNOSIS — I4821 Permanent atrial fibrillation: Secondary | ICD-10-CM | POA: Diagnosis not present

## 2023-11-12 DIAGNOSIS — I5042 Chronic combined systolic (congestive) and diastolic (congestive) heart failure: Secondary | ICD-10-CM | POA: Diagnosis not present

## 2023-11-12 DIAGNOSIS — Z8673 Personal history of transient ischemic attack (TIA), and cerebral infarction without residual deficits: Secondary | ICD-10-CM

## 2023-11-12 NOTE — Patient Instructions (Signed)
Medication Instructions:  Your physician recommends that you continue on your current medications as directed. Please refer to the Current Medication list given to you today.  *If you need a refill on your cardiac medications before your next appointment, please call your pharmacy*   Lab Work: NONE ordered at this time of appointment    Testing/Procedures: NONE ordered at this time of appointment     Follow-Up: At [facility], you and your health needs are our priority.  As part of our continuing mission to provide you with exceptional heart care, we have created designated Provider Care Teams.  These Care Teams include your primary Cardiologist (physician) and Advanced Practice Providers (APPs -  Physician Assistants and Nurse Practitioners) who all work together to provide you with the care you need, when you need it.  We recommend signing up for the patient portal called "MyChart".  Sign up information is provided on this After Visit Summary.  MyChart is used to connect with patients for Virtual Visits (Telemedicine).  Patients are able to view lab/test results, encounter notes, upcoming appointments, etc.  Non-urgent messages can be sent to your provider as well.   To learn more about what you can do with MyChart, go to [email or web address].    Your next appointment:   6 month(s)  Provider:   Brian Crenshaw, MD     Other Instructions   

## 2023-12-06 ENCOUNTER — Encounter: Payer: Self-pay | Admitting: Cardiology

## 2023-12-06 MED ORDER — SACUBITRIL-VALSARTAN 24-26 MG PO TABS
1.0000 | ORAL_TABLET | Freq: Two times a day (BID) | ORAL | 3 refills | Status: AC
Start: 2023-12-06 — End: ?

## 2023-12-22 ENCOUNTER — Other Ambulatory Visit: Payer: Self-pay | Admitting: Family Medicine

## 2023-12-22 DIAGNOSIS — G603 Idiopathic progressive neuropathy: Secondary | ICD-10-CM

## 2023-12-26 ENCOUNTER — Encounter: Payer: Self-pay | Admitting: Family Medicine

## 2023-12-26 DIAGNOSIS — G4739 Other sleep apnea: Secondary | ICD-10-CM

## 2023-12-30 ENCOUNTER — Other Ambulatory Visit: Payer: Self-pay | Admitting: Cardiology

## 2023-12-30 DIAGNOSIS — I4891 Unspecified atrial fibrillation: Secondary | ICD-10-CM

## 2023-12-30 NOTE — Telephone Encounter (Signed)
 Prescription refill request for Xarelto received.  Indication: Afib  Last office visit: 11/12/23 ([place])  Weight: 132kg Age: 88 Scr: 0.96 (11/05/23)   CrCl: 87.53ml/min  Appropriate dose. Refill sent.

## 2024-01-01 NOTE — Addendum Note (Signed)
 Addended by: Raynald Kemp A on: 01/01/2024 05:30 PM   Modules accepted: Orders

## 2024-01-22 ENCOUNTER — Encounter: Payer: Self-pay | Admitting: Family Medicine

## 2024-01-22 ENCOUNTER — Telehealth: Payer: Self-pay | Admitting: Family Medicine

## 2024-01-22 NOTE — Telephone Encounter (Signed)
 Forms completed,faxed and scanned into chart.   Mychart messaged sent advising pt of this

## 2024-01-22 NOTE — Telephone Encounter (Signed)
   Completed FMLA for [employer]. Placed in [place] B box

## 2024-02-17 ENCOUNTER — Other Ambulatory Visit (HOSPITAL_BASED_OUTPATIENT_CLINIC_OR_DEPARTMENT_OTHER)

## 2024-02-24 NOTE — Progress Notes (Unsigned)
 Charlotte [name]

## 2024-02-24 NOTE — Progress Notes (Unsigned)
 PATIENT: Charlotte [name] DOB: [birth year]  REASON FOR VISIT: follow up HISTORY FROM: patient  Chief Complaint  Patient presents with   RM2/BiPAP    Pt is here with her Daughter. Pt's daughter states that pt has been doing okay with her BiPAP Machine.     HISTORY OF PRESENT ILLNESS:  02/25/24 ALL: Charlotte [name] returns for follow up for complex sleep apnea on ASV. She reports doing well. She is using therapy nightly for about 8 hours, on average. She did have a root canal in 11/2023 and did not use therapy for about a week. She is feeling better rested. Seems more alert during the day. Cognitive functioning improved. Managed with [facility].     02/19/2023 ALL:  Charlotte [name] returns for follow up for complex sleep apnea on ASV. She was last seen by me 12/2021 and titration study advised. Titration study 12/18/2021 showed apnea best managed with ASV. Chin strap advised to help with leak. She was seen in follow up with Dr Charlotte [name] 02/2022 and doing well. Since, she reports doing well. She has not noted any significant difference in how she feels but her daughter reports she can tell. She seems more alert and less sleepy. She is no longer waking multiple times to urinate at night. Daughter reports red frown face with mask seal most nights. It seems that seal is good until she gets to sleep then tosses and turns. She is using a dream Charlotte [name] style FFM. Charlotte [name] has not noticed much air leaking. She has two caregivers who stay with her throughout the day (split times). She is alone at night. No falls. She is able to get around with her walker at home but uses wheelchair for longer distances.        12/05/2021 ALL: Charlotte [name] returns for follow up for complex sleep apnea on ASV. She was last seen by Dr Charlotte [name] 11/2020. She was advised to work with [facility] in person to correct mask fit/leak. Her daughter called in 09/2021 stating that they were not seen in person and Mrs Charlotte [name] continued to have difficulty with compliance. We  called [facility] again to ask that she be sen in person. Daughther reports that RT called them but did not feel in person visit was needed and stated that current dream Charlotte [name] mask was best fit for her. She feels that she has mask sealed correctly but notes significant air blowing from top of mask. Daughter is concerned that pressure setting are too strong for her to tolerate. She does note significant improvement in alertness when her mother is able to use ASV. She remains very tired during the day. She will fall asleep anytime she gets still. ESS completed, today, however daughter feels that multiple questions do not apply and no answers are documented. I suspect ESS is higher than stated responses.   She continues atorvastatin  40mg  daily. She is on Xarelto  for afib. She continues to use her Rolator. No falls. No stroke symptoms. She is follwed regularly by PCP.     10/20/2020 ALL:  Charlotte [name] is a 88 y.o. female here today for follow up for recently diagnosed severe complex sleep apnea started on ASV therapy.  HST showed AHI 67/hr with 29 central events. She had hypoxemia for > 60 minutes. Titration study on 9/29 showed events were well controlled on ASV (Dreamwear FFM, small).  She reports that she is trying to adjust to therapy.  She had a very difficult time in the beginning with air leaking from  the DreamWear mask. She has recently changed to a different fullface mask that covers the bridge of her nose.  She feels that she is doing better with this mask.  She does continue to hear air that prevents her from being able to go to sleep.  Compliance report dated 09/19/2020 through 10/18/2020 reveals that she used ASV 22 of the past 30 days for compliance of 73%.  She used ASV greater than 4 hours to of the past 30 days for compliance of 7%.  Average usage on days used was 1 hour and 27 minutes.  Residual AHI was 8.2 with a EPAP of 10 cm of water and pressure support of 4-15.  There was a significant leak  noted in the 95th percentile of 37.1 L/min.   She was last seen by Dr Charlotte [name] for stroke follow up in 03/2020. She continues Xarelto . She was referred to GI for gastric bleeds but never seen. She has been doing well on pantoprazole . She also continues atorvastatin  40mg .    HISTORY: (copied from previous note)  Charlotte [name] is a 88 year- old Caucasian female  Stroke patient and seen here upon referral on 05/12/2020 from Dr Charlotte [name].   Chief concern according to patient :    Charlotte [name] reports excessive daytime sleepiness being more sleepy than she usually used to be.  She has very frequent nocturia and this is not a new issue but may have been present for 2-3 or even 4 years.  Multiple bathroom breaks means up to 7 nocturia's.  She also has reached a stage of physical deconditioning that makes it harder for her to not get short of breath while she walks or performs rather simple tasks.  She is using a walker for ambulation, but she reports that she sleeps because of orthopnea better than her back is elevated so a recliner is more conducive to sleep than her bed. Family members have not heard her snore and there is a good chance that this patient has central apnea due to her congestive heart failure history associated with atrial fibrillation.  She also may be much more unrefreshed because of anemia and chronic blood loss in the setting of chronic anticoagulation.  She had suffered a GI bleed in the past.  There was no alternative to resuming at least some level of anticoagulation safely.  Remains on Xarelto  and  No longer on ASA.    I have the pleasure of seeing Charlotte [name] today, a right -handed White or Caucasian female with a possible sleep disorder.  She has a  has a past medical history of Aortic insufficiency, Atrial fibrillation (HCC), CAD (coronary artery disease), CATARACTS, Chronic systolic heart failure (HCC), Complication of anesthesia (2009), GERD (gastroesophageal reflux disease), HYPERLIPIDEMIA,  MILD, HYPERTENSION, BENIGN SYSTEMIC, Irritable bowel syndrome, LBBB (left bundle branch block), MEDIAL EPICONDYLITIS, NSVT (nonsustained ventricular tachycardia) (HCC), OSTEOARTHRITIS, OSTEOPENIA, PONV (postoperative nausea and vomiting), Pre-diabetes, RHINITIS, ALLERGIC, Stroke (HCC), SYNCOPE, and VITAMIN D  DEFICIENCY.   Social history:  Patient is retired from [employer], and lives in a household with alone.  Family status is widowed  with adult children, her daughter is her neighbour.   She likes puzzles and sudoko. No longer reads.  Pets are not present. Tobacco use "not in 50 years ".  ETOH use ;none ,  Caffeine intake in form of Coffee( some) Soda( /) Tea ( some) or energy drinks.   Sleep habits are as follows: The patient's dinner time is between  6 PM.  The patient goes to bed at 10.30 PM and continues to sleep for one hour at a time , fragmented  hourly, wakes for nocturia- bathroom breaks. The preferred sleep position is in bed in a lateral or supine position, but she changes soon to the recliner-  About 4-5 AM.  9 AM is the usual rise time. The patient wakes up spontaneously.  She reports not feeling refreshed or restored in AM, with symptoms such as dry mouth and residual fatigue.  Naps are taken frequently,  She will doze off - lasting from 20-60 minutes and are more refreshing than nocturnal sleep.   REVIEW OF SYSTEMS: Out of a complete 14 system review of symptoms, the patient complains only of the following symptoms, fatigue, shob, insomnia and all other reviewed systems are negative.  ESS: 6/24   ALLERGIES: Allergies  Allergen Reactions   Metformin  And Related Other (See Comments)    Renal failure   Amlodipine Other (See Comments)    HEADACHE    HOME MEDICATIONS: Outpatient Medications Prior to Visit  Medication Sig Dispense Refill   acetaminophen  (TYLENOL ) 500 MG tablet Take 1,000 mg by mouth in the morning and at bedtime.     alendronate  (FOSAMAX )  70 MG tablet Take 1 tablet (70 mg total) by mouth every 7 (seven) days. Take with a full glass of water on an empty stomach. 12 tablet 3   atorvastatin  (LIPITOR) 40 MG tablet TAKE 1 TABLET BY MOUTH DAILY 10 tablet 35   carvedilol  (COREG ) 12.5 MG tablet Take 1 tablet (12.5 mg total) by mouth 2 (two) times daily with a meal.     DULoxetine  (CYMBALTA ) 30 MG capsule TAKE 1 CAPSULE BY MOUTH DAILY 90 capsule 3   Ferrous Sulfate  (IRON  PO) Take 1 tablet by mouth every other day.     fluticasone  (FLONASE ) 50 MCG/ACT nasal spray Place 1 spray into both nostrils daily. 48 g 0   furosemide  (LASIX ) 40 MG tablet TAKE 1 TABLET BY MOUTH TWICE  DAILY 20 tablet 35   Multiple Vitamins-Minerals (SYSTANE ICAPS AREDS2) CAPS Take 1 capsule by mouth daily.     nystatin  (MYCOSTATIN /NYSTOP ) powder APPLY TOPICALLY 3 TIMES A DAY 60 g 3   nystatin  cream (MYCOSTATIN ) APPLY TO AFFECTED AREA TWICE A DAY 60 g 4   omeprazole  (PRILOSEC) 40 MG capsule Take 1 capsule (40 mg total) by mouth daily. 200 capsule 2   Potassium Chloride  ER 20 MEQ TBCR Take 1 tablet (20 mEq total) by mouth 2 (two) times daily. 200 tablet 2   rivaroxaban  (XARELTO ) 20 MG TABS tablet TAKE 1 TABLET BY MOUTH DAILY  WITH SUPPER 100 tablet 1   sacubitril -valsartan  (ENTRESTO ) 24-26 MG Take 1 tablet by mouth 2 (two) times daily. 180 tablet 3   spironolactone  (ALDACTONE ) 25 MG tablet Take 0.5 tablets (12.5 mg total) by mouth daily. 45 tablet 3   triamcinolone  cream (KENALOG ) 0.1 % Apply topically 2 (two) times daily. 30 g 1   No facility-administered medications prior to visit.    PAST MEDICAL HISTORY: Past Medical History:  Diagnosis Date   Aortic insufficiency    Atrial fibrillation (HCC)    a. s/p DCCV 03/2013   CAD (coronary artery disease)    LHC 06/2010: EF 55%, mild plaque in the LAD 20-30%, otherwise normal coronary arteries   CATARACTS    Chronic systolic heart failure (HCC)    in setting of AFib;  Echocardiogram 02/2013: Moderate LVH, EF 25-30%,  anteroseptal and apical HK,  moderate AI, MAC, moderate MR, moderate LAE, mild RAE, PASP 32 => after DCCV f/u Echo 8/14: Moderate LVH, EF 60-65%, normal wall motion, grade 1 diastolic dysfunction, moderate AI, moderate LAE   Complication of anesthesia 2009   nausea and vomitting   GERD (gastroesophageal reflux disease)    HYPERLIPIDEMIA, MILD    HYPERTENSION, BENIGN SYSTEMIC    Irritable bowel syndrome    LBBB (left bundle branch block)    MEDIAL EPICONDYLITIS    NSVT (nonsustained ventricular tachycardia) (HCC)    during Dob Echo 2011 => normal cath   OSTEOARTHRITIS    OSTEOPENIA    PONV (postoperative nausea and vomiting)    Pre-diabetes    RHINITIS, ALLERGIC    Stroke (HCC)    SYNCOPE    VITAMIN D  DEFICIENCY     PAST SURGICAL HISTORY: Past Surgical History:  Procedure Laterality Date   BIOPSY  03/01/2020   Procedure: BIOPSY;  Surgeon: Felecia Hopper, MD;  Location: [facility] ENDOSCOPY;  Service: Gastroenterology;;   CARDIOVERSION N/A 04/01/2013   Procedure: CARDIOVERSION;  Surgeon: Loyde Rule, MD;  Location: [facility] ENDOSCOPY;  Service: Cardiovascular;  Laterality: N/A;   CARDIOVERSION N/A 07/16/2017   Procedure: CARDIOVERSION;  Surgeon: Hazle Lites, MD;  Location: [facility] ENDOSCOPY;  Service: Cardiovascular;  Laterality: N/A;   CARDIOVERSION N/A 07/07/2018   Procedure: CARDIOVERSION;  Surgeon: Hazle Lites, MD;  Location[facility] ENDOSCOPY;  Service: Cardiovascular;  Laterality: N/A;   CATARACT EXTRACTION, BILATERAL  2012   Dr. Lasandra Points   COLONOSCOPY WITH PROPOFOL  N/A 05/23/2017   Procedure: COLONOSCOPY WITH PROPOFOL ;  Surgeon: Celedonio Coil, MD;  Location: [facility] ENDOSCOPY;  Service: Endoscopy;  Laterality: N/A;   ESOPHAGOGASTRODUODENOSCOPY N/A 03/01/2020   Procedure: ESOPHAGOGASTRODUODENOSCOPY (EGD);  Surgeon: Felecia Hopper, MD;  Location: [facility] ENDOSCOPY;  Service: Gastroenterology;  Laterality: N/A;   ESOPHAGOGASTRODUODENOSCOPY (EGD) WITH PROPOFOL  N/A 05/23/2017   Procedure:  ESOPHAGOGASTRODUODENOSCOPY (EGD) WITH PROPOFOL ;  Surgeon: Celedonio Coil, MD;  Location: [facility] ENDOSCOPY;  Service: Endoscopy;  Laterality: N/A;   IR KYPHO LUMBAR INC FX REDUCE BONE BX UNI/BIL CANNULATION INC/IMAGING  06/06/2017   IR RADIOLOGIST EVAL & MGMT  07/01/2017   RADICAL HYSTERECTOMY     REPLACEMENT TOTAL KNEE     11/03/07 right....... 03/29/08 left    FAMILY HISTORY: Family History  Problem Relation Age of Onset   Arrhythmia Mother        afib   Alzheimer's disease Mother    Stroke Mother    Colon cancer Father    Heart attack Father 91   Diabetes Sister    Arrhythmia Sister        afib   Stroke Maternal Grandmother     SOCIAL HISTORY: Social History   Socioeconomic History   Marital status: Divorced    Spouse name: Not on file   Number of children: 1   Years of education: 12   Highest education level: High school graduate  Occupational History   Occupation: Retired    Associate Professor: RETIRED  Tobacco Use   Smoking status: Former    Current packs/day: 0.00    Types: Cigarettes    Quit date: 10/08/1970    Years since quitting: 53.4   Smokeless tobacco: Never  Vaping Use   Vaping status: Never Used  Substance and Sexual Activity   Alcohol  use: No   Drug use: No   Sexual activity: Not on file  Other Topics Concern   Not on file  Social History Narrative   Lives by herself at  home, next to her daughter. She enjoys reading.   Social Drivers of Corporate investment banker Strain: Low Risk  (05/07/2023)   Overall Financial Resource Strain (CARDIA)    Difficulty of Paying Living Expenses: Not hard at all  Food Insecurity: No Food Insecurity (05/07/2023)   Hunger Vital Sign    Worried About Running Out of Food in the Last Year: Never true    Ran Out of Food in the Last Year: Never true  Transportation Needs: No Transportation Needs (05/07/2023)   PRAPARE - Administrator, Civil Service (Medical): No    Lack of Transportation (Non-Medical): No  Physical  Activity: Inactive (05/07/2023)   Exercise Vital Sign    Days of Exercise per Week: 0 days    Minutes of Exercise per Session: 0 min  Stress: No Stress Concern Present (05/07/2023)   [employer] of Occupational Health - Occupational Stress Questionnaire    Feeling of Stress : Not at all  Social Connections: Socially Isolated (05/07/2023)   Social Connection and Isolation Panel [NHANES]    Frequency of Communication with Friends and Family: More than three times a week    Frequency of Social Gatherings with Friends and Family: More than three times a week    Attends Religious Services: Never    Database administrator or Organizations: No    Attends Banker Meetings: Never    Marital Status: Divorced  Catering manager Violence: Not At Risk (05/07/2023)   Humiliation, Afraid, Rape, and Kick questionnaire    Fear of Current or Ex-Partner: No    Emotionally Abused: No    Physically Abused: No    Sexually Abused: No     PHYSICAL EXAM  Vitals:   02/25/24 1340  BP: 114/61  Pulse: 85  Weight: 287 lb (130.2 kg)  Height: 5\' 2"  (1.575 m)      Body mass index is 52.49 kg/m.  Generalized: Well developed, in no acute distress  Cardiology: normal rate and rhythm, no murmur noted Respiratory: clear to auscultation bilaterally  Neurological examination  Mentation: Alert oriented to time, place, history taking. Follows all commands speech and language fluent Cranial nerve II-XII: Pupils were equal round reactive to light. Extraocular movements were full, visual field were full  Motor: The motor testing reveals 5 over 5 strength of all 4 extremities. Good symmetric motor tone is noted throughout.  Gait and station: Gait not assess, using wheelchair today    DIAGNOSTIC DATA (LABS, IMAGING, TESTING) - I reviewed patient records, labs, notes, testing and imaging myself where available.      No data to display           Lab Results  Component Value Date   WBC 6.9  11/05/2023   HGB 14.0 11/05/2023   HCT 41.6 11/05/2023   MCV 92 11/05/2023   PLT 248 11/05/2023      Component Value Date/Time   NA 138 11/05/2023 1506   K 4.6 11/05/2023 1506   CL 100 11/05/2023 1506   CO2 20 11/05/2023 1506   GLUCOSE 99 11/05/2023 1506   GLUCOSE 110 (H) 03/06/2022 0335   BUN 20 11/05/2023 1506   CREATININE 0.96 11/05/2023 1506   CREATININE 0.93 01/30/2022 0000   CALCIUM  9.1 11/05/2023 1506   PROT 7.2 11/05/2023 1506   ALBUMIN 4.0 11/05/2023 1506   AST 22 11/05/2023 1506   ALT 18 11/05/2023 1506   ALKPHOS 90 11/05/2023 1506   BILITOT 0.6 11/05/2023 1506  GFRNONAA >60 03/06/2022 0335   GFRNONAA 61 01/31/2021 0000   GFRAA 70 01/31/2021 0000   Lab Results  Component Value Date   CHOL 127 11/05/2023   HDL 42 11/05/2023   LDLCALC 68 11/05/2023   TRIG 87 11/05/2023   CHOLHDL 3.0 11/05/2023   Lab Results  Component Value Date   HGBA1C 6.2 (A) 11/05/2023   Lab Results  Component Value Date   VITAMINB12 319 05/22/2017   Lab Results  Component Value Date   TSH 2.67 07/18/2021     ASSESSMENT AND PLAN 88 y.o. year old female  has a past medical history of Aortic insufficiency, Atrial fibrillation (HCC), CAD (coronary artery disease), CATARACTS, Chronic systolic heart failure (HCC), Complication of anesthesia (2009), GERD (gastroesophageal reflux disease), HYPERLIPIDEMIA, MILD, HYPERTENSION, BENIGN SYSTEMIC, Irritable bowel syndrome, LBBB (left bundle branch block), MEDIAL EPICONDYLITIS, NSVT (nonsustained ventricular tachycardia) (HCC), OSTEOARTHRITIS, OSTEOPENIA, PONV (postoperative nausea and vomiting), Pre-diabetes, RHINITIS, ALLERGIC, Stroke (HCC), SYNCOPE, and VITAMIN D  DEFICIENCY. here with     ICD-10-CM   1. Complex sleep apnea syndrome  G47.39 For home use only DME Bipap    CANCELED: For home use only DME Bipap      Nivedita B Custer reports doing well with ASV therapy. Compliance report reveals excellent daily and optimal 4 hour usage. She  continues to have a large air leak and will focus on this at home. Now 9/hr. Risks of untreated sleep apnea review and education materials provided. Healthy lifestyle habits encouraged.  She will continue close follow-up with primary care for stroke prevention.  She will follow up in 1 year, sooner if needed.  She verbalizes understanding and agreement with this plan.    Orders Placed This Encounter  Procedures   For home use only DME Bipap    Supplies    Length of Need:   Lifetime    Inspiratory pressure:   OTHER SEE COMMENTS    Expiratory pressure:   OTHER SEE COMMENTS     No orders of the defined types were placed in this encounter.     Terrilyn Fick, FNP-C 02/25/2024, 2:08 PM [facility] [address], [address] [place], [place] [address] [phone]

## 2024-02-24 NOTE — Patient Instructions (Addendum)
Please continue using your ASV regularly. While your insurance requires that you use ASV at least 4 hours each night on 70% of the nights, I recommend, that you not skip any nights and use it throughout the night if you can. Getting used to ASV and staying with the treatment long term does take time and patience and discipline. Untreated obstructive sleep apnea when it is moderate to severe can have an adverse impact on cardiovascular health and raise her risk for heart disease, arrhythmias, hypertension, congestive heart failure, stroke and diabetes. Untreated obstructive sleep apnea causes sleep disruption, nonrestorative sleep, and sleep deprivation. This can have an impact on your day to day functioning and cause daytime sleepiness and impairment of cognitive function, memory loss, mood disturbance, and problems focussing. Using ASV regularly can improve these symptoms.  We will update supply orders, today.    Follow up in 1 year

## 2024-02-25 ENCOUNTER — Encounter: Payer: Self-pay | Admitting: Family Medicine

## 2024-02-25 ENCOUNTER — Ambulatory Visit: Payer: Medicare Other | Admitting: Family Medicine

## 2024-02-25 ENCOUNTER — Telehealth: Payer: Self-pay

## 2024-02-25 VITALS — BP 114/61 | HR 85 | Ht 62.0 in | Wt 287.0 lb

## 2024-02-25 DIAGNOSIS — G4739 Other sleep apnea: Secondary | ICD-10-CM

## 2024-02-25 NOTE — Telephone Encounter (Signed)
 Faxed BiPAP orders to [facility] [phone] on 02/25/2024

## 2024-03-03 ENCOUNTER — Encounter: Payer: Self-pay | Admitting: Family Medicine

## 2024-03-03 ENCOUNTER — Ambulatory Visit (INDEPENDENT_AMBULATORY_CARE_PROVIDER_SITE_OTHER): Payer: Medicare Other | Admitting: Family Medicine

## 2024-03-03 VITALS — BP 129/74 | HR 77 | Ht 62.0 in | Wt 296.0 lb

## 2024-03-03 DIAGNOSIS — E1169 Type 2 diabetes mellitus with other specified complication: Secondary | ICD-10-CM

## 2024-03-03 DIAGNOSIS — G4739 Other sleep apnea: Secondary | ICD-10-CM | POA: Diagnosis not present

## 2024-03-03 DIAGNOSIS — K649 Unspecified hemorrhoids: Secondary | ICD-10-CM | POA: Insufficient documentation

## 2024-03-03 DIAGNOSIS — E782 Mixed hyperlipidemia: Secondary | ICD-10-CM | POA: Diagnosis not present

## 2024-03-03 DIAGNOSIS — I5043 Acute on chronic combined systolic (congestive) and diastolic (congestive) heart failure: Secondary | ICD-10-CM | POA: Diagnosis not present

## 2024-03-03 DIAGNOSIS — I10 Essential (primary) hypertension: Secondary | ICD-10-CM

## 2024-03-03 DIAGNOSIS — E669 Obesity, unspecified: Secondary | ICD-10-CM

## 2024-03-03 LAB — POCT GLYCOSYLATED HEMOGLOBIN (HGB A1C): Hemoglobin A1C: 6 % — AB (ref 4.0–5.6)

## 2024-03-03 NOTE — Assessment & Plan Note (Signed)
 We did discuss not using the steroid cream for more than a week at a time and then giving the skin a break.  She can use things like lidocaine  or even Tucks pads in between if needed.

## 2024-03-03 NOTE — Patient Instructions (Signed)
 Weigh yourself on your home scale and if you are up more than 2 pounds then your baseline weight then go ahead and bump up your diuretic for the next 2 to 3 days and see if you are able to get that weight back down.

## 2024-03-03 NOTE — Progress Notes (Unsigned)
 Established Patient Office Visit  Subjective  Patient ID: Charlotte [name], female    DOB: 1936-05-06  Age: 88 y.o. MRN: [id number]  Chief Complaint  Patient presents with   Diabetes    HPI  Diabetes - no hypoglycemic events. No wounds or sores that are not healing well. No increased thirst or urination. Checking glucose at home. Taking medications as prescribed without any side effects.  DEXA scan scheduled for next months  How often to use the topical steroid cream for hemorrhoids.  Still having some loose stools but not quite as bad as they were previously they are just more frequent than anything.    ROS    Objective:     BP 129/74   Pulse 77   Ht 5\' 2"  (1.575 m)   Wt 296 lb 0.6 oz (134.3 kg)   SpO2 96%   BMI 54.15 kg/m    Physical Exam Vitals and nursing note reviewed.  Constitutional:      Appearance: Normal appearance.  HENT:     Head: Normocephalic and atraumatic.  Eyes:     Conjunctiva/sclera: Conjunctivae normal.  Cardiovascular:     Rate and Rhythm: Normal rate and regular rhythm.  Pulmonary:     Effort: Pulmonary effort is normal.     Breath sounds: Normal breath sounds.  Skin:    General: Skin is warm and dry.  Neurological:     Mental Status: She is alert.  Psychiatric:        Mood and Affect: Mood normal.      Results for orders placed or performed in visit on 03/03/24  CMP14+EGFR  Result Value Ref Range   Glucose 101 (H) 70 - 99 mg/dL   BUN 22 8 - 27 mg/dL   Creatinine, Ser 1.30 0.57 - 1.00 mg/dL   eGFR 66 >86 VH/QIO/9.62   BUN/Creatinine Ratio 26 12 - 28   Sodium 140 134 - 144 mmol/L   Potassium 4.2 3.5 - 5.2 mmol/L   Chloride 104 96 - 106 mmol/L   CO2 20 20 - 29 mmol/L   Calcium  8.8 8.7 - 10.3 mg/dL   Total Protein 6.8 6.0 - 8.5 g/dL   Albumin 3.8 3.7 - 4.7 g/dL   Globulin, Total 3.0 1.5 - 4.5 g/dL   Bilirubin Total 0.5 0.0 - 1.2 mg/dL   Alkaline Phosphatase 104 44 - 121 IU/L   AST 22 0 - 40 IU/L   ALT 16 0 - 32 IU/L  POCT  HgB A1C  Result Value Ref Range   Hemoglobin A1C 6.0 (A) 4.0 - 5.6 %   HbA1c POC (<> result, manual entry)     HbA1c, POC (prediabetic range)     HbA1c, POC (controlled diabetic range)        The ASCVD Risk score (Arnett DK, et al., 2019) failed to calculate for the following reasons:   The 2019 ASCVD risk score is only valid for ages 26 to 53   Risk score cannot be calculated because patient has a medical history suggesting prior/existing ASCVD    Assessment & Plan:   Problem List Items Addressed This Visit       Cardiovascular and Mediastinum   Hemorrhoids   We did discuss not using the steroid cream for more than a week at a time and then giving the skin a break.  She can use things like lidocaine  or even Tucks pads in between if needed.      Essential hypertension   Well  controlled. Continue current regimen. Follow up in  6 mo       Relevant Orders   POCT HgB A1C (Completed)   CMP14+EGFR (Completed)   CHF (congestive heart failure) (HCC)   Currently getting her Entresto  through patient assistance.        Respiratory   Complex sleep apnea syndrome   She is on BiPAP with [facility]        Endocrine   Type 2 diabetes mellitus with obesity (HCC)   A1c looks fantastic today at 6.0.  Continue to current regimen call if any concerns or problems.      Relevant Orders   CMP14+EGFR (Completed)   Other Visit Diagnoses       DM type 2 with diabetic mixed hyperlipidemia (HCC)    -  Primary   Relevant Orders   POCT HgB A1C (Completed)       Return in about 4 months (around 07/04/2024) for Diabetes follow-up.    Duaine German, MD

## 2024-03-03 NOTE — Assessment & Plan Note (Signed)
 She is on BiPAP with [facility]

## 2024-03-03 NOTE — Assessment & Plan Note (Signed)
 A1c looks fantastic today at 6.0.  Continue to current regimen call if any concerns or problems.

## 2024-03-03 NOTE — Assessment & Plan Note (Signed)
 Currently getting her Entresto  through patient assistance.

## 2024-03-03 NOTE — Assessment & Plan Note (Signed)
 Well controlled. Continue current regimen. Follow up in  6 mo

## 2024-03-04 ENCOUNTER — Ambulatory Visit: Payer: Self-pay | Admitting: Family Medicine

## 2024-03-04 LAB — CMP14+EGFR
ALT: 16 IU/L (ref 0–32)
AST: 22 IU/L (ref 0–40)
Albumin: 3.8 g/dL (ref 3.7–4.7)
Alkaline Phosphatase: 104 IU/L (ref 44–121)
BUN/Creatinine Ratio: 26 (ref 12–28)
BUN: 22 mg/dL (ref 8–27)
Bilirubin Total: 0.5 mg/dL (ref 0.0–1.2)
CO2: 20 mmol/L (ref 20–29)
Calcium: 8.8 mg/dL (ref 8.7–10.3)
Chloride: 104 mmol/L (ref 96–106)
Creatinine, Ser: 0.86 mg/dL (ref 0.57–1.00)
Globulin, Total: 3 g/dL (ref 1.5–4.5)
Glucose: 101 mg/dL — ABNORMAL HIGH (ref 70–99)
Potassium: 4.2 mmol/L (ref 3.5–5.2)
Sodium: 140 mmol/L (ref 134–144)
Total Protein: 6.8 g/dL (ref 6.0–8.5)
eGFR: 66 mL/min/{1.73_m2} (ref >59)

## 2024-03-04 NOTE — Progress Notes (Signed)
 Your lab work is within acceptable range and there are no concerning findings.   ?

## 2024-03-18 ENCOUNTER — Ambulatory Visit

## 2024-03-18 DIAGNOSIS — Z13820 Encounter for screening for osteoporosis: Secondary | ICD-10-CM | POA: Diagnosis not present

## 2024-03-18 DIAGNOSIS — M8589 Other specified disorders of bone density and structure, multiple sites: Secondary | ICD-10-CM | POA: Diagnosis not present

## 2024-03-18 DIAGNOSIS — Z780 Asymptomatic menopausal state: Secondary | ICD-10-CM | POA: Diagnosis not present

## 2024-03-18 DIAGNOSIS — M8000XS Age-related osteoporosis with current pathological fracture, unspecified site, sequela: Secondary | ICD-10-CM

## 2024-03-20 ENCOUNTER — Ambulatory Visit: Payer: Self-pay | Admitting: Family Medicine

## 2024-03-20 NOTE — Progress Notes (Signed)
 Hi Charlotte [name], your bone density test shows a T-score of -1.9 in the hip.  You have had a decrease in bone density of 17% in the hip and 12% in the forearm.  Make sure you are still getting adequate calcium  and vitamin D  daily in your diet.  I did not have a calcium  or vitamin D  on your med list so I just wanted to make sure that you are getting enough and either through your diet or with a supplement.  There are great products like Caltrate D that can help you do that.  In addition to continuing with the Fosamax .  Also really encourage you to work on increasing activity and weightbearing exercises and using things like bands and light weights to help improve bone strength.  Recommend repeat bone density in 2 years.  Let us  know where you had your last eye exam so we can get your chart updated as well.

## 2024-03-27 ENCOUNTER — Other Ambulatory Visit: Payer: Self-pay | Admitting: Family Medicine

## 2024-04-28 ENCOUNTER — Other Ambulatory Visit: Payer: Self-pay | Admitting: Family Medicine

## 2024-04-28 DIAGNOSIS — K649 Unspecified hemorrhoids: Secondary | ICD-10-CM

## 2024-05-02 ENCOUNTER — Encounter: Payer: Self-pay | Admitting: Family Medicine

## 2024-05-02 ENCOUNTER — Encounter: Payer: Self-pay | Admitting: Cardiology

## 2024-05-02 DIAGNOSIS — B372 Candidiasis of skin and nail: Secondary | ICD-10-CM

## 2024-05-02 DIAGNOSIS — I4819 Other persistent atrial fibrillation: Secondary | ICD-10-CM

## 2024-05-04 ENCOUNTER — Encounter: Payer: Self-pay | Admitting: Family Medicine

## 2024-05-04 MED ORDER — NYSTATIN 100000 UNIT/GM EX POWD: Freq: Two times a day (BID) | CUTANEOUS | 3 refills | Status: AC

## 2024-05-04 MED ORDER — CARVEDILOL 12.5 MG PO TABS: 12.5000 mg | ORAL_TABLET | Freq: Two times a day (BID) | ORAL | 3 refills | Status: AC

## 2024-05-04 MED ORDER — NYSTATIN 100000 UNIT/GM EX CREA: TOPICAL_CREAM | CUTANEOUS | 4 refills | Status: AC

## 2024-05-14 ENCOUNTER — Ambulatory Visit

## 2024-05-14 VITALS — Ht 62.0 in | Wt 292.0 lb

## 2024-05-14 DIAGNOSIS — Z0000 Encounter for general adult medical examination without abnormal findings: Secondary | ICD-10-CM | POA: Diagnosis not present

## 2024-05-14 NOTE — Progress Notes (Signed)
 Subjective:   Charlotte [name] is a 88 y.o. female who presents for Medicare Annual (Subsequent) preventive examination.  Visit Complete: Virtual I connected with  Charlotte [name] on 05/14/24 by a audio enabled telemedicine application and verified that I am speaking with the correct person using two identifiers.  Patient Location: Home  Provider Location: Office/Clinic  I discussed the limitations of evaluation and management by telemedicine. The patient expressed understanding and agreed to proceed.  Vital Signs: Because this visit was a virtual/telehealth visit, some criteria may be missing or patient reported. Any vitals not documented were not able to be obtained and vitals that have been documented are patient reported.  Patient Medicare AWV questionnaire was completed by the patient on 05/12/2024; I have confirmed that all information answered by patient is correct and no changes since this date.  Cardiac Risk Factors include: advanced age (>16men, >47 women);obesity (BMI >30kg/m2);sedentary lifestyle;smoking/ tobacco exposure;hypertension;dyslipidemia;family history of premature cardiovascular disease     Objective:    Today's Vitals   05/14/24 1104 05/14/24 1105  Weight: 292 lb (132.5 kg)   Height: 5' 2 (1.575 m)   PainSc:  6    Body mass index is 53.41 kg/m.     05/14/2024   11:14 AM 05/07/2023   11:11 AM 05/02/2022   11:05 AM 02/27/2022   12:00 PM 02/17/2021    2:39 PM 03/30/2020    8:56 PM 02/29/2020   10:17 PM  Advanced Directives  Does Patient Have a Medical Advance Directive? Yes Yes Yes Yes Yes No No  Type of Estate agent of [place];Living will Living will Living will Healthcare Power of [place];Living will Living will;Healthcare Power of Attorney    Does patient want to make changes to medical advance directive? No - Patient declined No - Patient declined No - Patient declined No - Patient declined No - Patient declined    Copy of Healthcare  Power of Attorney in Chart? No - copy requested    No - copy requested    Would patient like information on creating a medical advance directive?       No - Patient declined    Current Medications (verified) Outpatient Encounter Medications as of 05/14/2024  Medication Sig   acetaminophen  (TYLENOL ) 500 MG tablet Take 1,000 mg by mouth in the morning and at bedtime.   alendronate  (FOSAMAX ) 70 MG tablet Take 1 tablet (70 mg total) by mouth every 7 (seven) days. Take with a full glass of water on an empty stomach.   atorvastatin  (LIPITOR) 40 MG tablet TAKE 1 TABLET BY MOUTH DAILY   carvedilol  (COREG ) 12.5 MG tablet Take 1 tablet (12.5 mg total) by mouth 2 (two) times daily with a meal.   DULoxetine  (CYMBALTA ) 30 MG capsule TAKE 1 CAPSULE BY MOUTH DAILY   Ferrous Sulfate  (IRON  PO) Take 1 tablet by mouth every other day.   fluticasone  (FLONASE ) 50 MCG/ACT nasal spray Place 1 spray into both nostrils daily.   furosemide  (LASIX ) 40 MG tablet TAKE 1 TABLET BY MOUTH TWICE  DAILY   Multiple Vitamins-Minerals (SYSTANE ICAPS AREDS2) CAPS Take 1 capsule by mouth daily.   nystatin  (MYCOSTATIN /NYSTOP ) powder Apply topically 2 (two) times daily.   nystatin  cream (MYCOSTATIN ) APPLY TO AFFECTED AREA TWICE A DAY   omeprazole  (PRILOSEC) 40 MG capsule Take 1 capsule (40 mg total) by mouth daily.   Potassium Chloride  ER 20 MEQ TBCR TAKE 1 TABLET BY MOUTH TWICE  DAILY   rivaroxaban  (XARELTO ) 20 MG  TABS tablet TAKE 1 TABLET BY MOUTH DAILY  WITH SUPPER   sacubitril -valsartan  (ENTRESTO ) 24-26 MG Take 1 tablet by mouth 2 (two) times daily.   spironolactone  (ALDACTONE ) 25 MG tablet Take 0.5 tablets (12.5 mg total) by mouth daily.   triamcinolone  cream (KENALOG ) 0.1 % APPLY 1 TO 2 APPLICATIONS  TOPICALLY TWICE DAILY ; GIVE  SKIN A BREAK AFTER 2 WEEKS   No facility-administered encounter medications on file as of 05/14/2024.    Allergies (verified) Metformin  and related and Amlodipine   History: Past Medical History:   Diagnosis Date   Aortic insufficiency    Atrial fibrillation (HCC)    a. s/p DCCV 03/2013   CAD (coronary artery disease)    LHC 06/2010: EF 55%, mild plaque in the LAD 20-30%, otherwise normal coronary arteries   CATARACTS    Chronic systolic heart failure (HCC)    in setting of AFib;  Echocardiogram 02/2013: Moderate LVH, EF 25-30%, anteroseptal and apical HK, moderate AI, MAC, moderate MR, moderate LAE, mild RAE, PASP 32 => after DCCV f/u Echo 8/14: Moderate LVH, EF 60-65%, normal wall motion, grade 1 diastolic dysfunction, moderate AI, moderate LAE   Complication of anesthesia 2009   nausea and vomitting   GERD (gastroesophageal reflux disease)    HYPERLIPIDEMIA, MILD    HYPERTENSION, BENIGN SYSTEMIC    Irritable bowel syndrome    LBBB (left bundle branch block)    MEDIAL EPICONDYLITIS    NSVT (nonsustained ventricular tachycardia) (HCC)    during Dob Echo 2011 => normal cath   OSTEOARTHRITIS    OSTEOPENIA    PONV (postoperative nausea and vomiting)    Pre-diabetes    RHINITIS, ALLERGIC    Stroke (HCC)    SYNCOPE    VITAMIN D  DEFICIENCY    Past Surgical History:  Procedure Laterality Date   BIOPSY  03/01/2020   Procedure: BIOPSY;  Surgeon: Elicia Claw, MD;  Location: [facility] ENDOSCOPY;  Service: Gastroenterology;;   CARDIOVERSION N/A 04/01/2013   Procedure: CARDIOVERSION;  Surgeon: Maude JAYSON Emmer, MD;  Location: [facility] ENDOSCOPY;  Service: Cardiovascular;  Laterality: N/A;   CARDIOVERSION N/A 07/16/2017   Procedure: CARDIOVERSION;  Surgeon: Mona Vinie JAYSON, MD;  Location: [facility] ENDOSCOPY;  Service: Cardiovascular;  Laterality: N/A;   CARDIOVERSION N/A 07/07/2018   Procedure: CARDIOVERSION;  Surgeon: Mona Vinie JAYSON, MD;  Location: [facility] ENDOSCOPY;  Service: Cardiovascular;  Laterality: N/A;   CATARACT EXTRACTION, BILATERAL  2012   Dr. Cleatus   COLONOSCOPY WITH PROPOFOL  N/A 05/23/2017   Procedure: COLONOSCOPY WITH PROPOFOL ;  Surgeon: Lennard Lesta FALCON, MD;  Location: [facility] ENDOSCOPY;   Service: Endoscopy;  Laterality: N/A;   ESOPHAGOGASTRODUODENOSCOPY N/A 03/01/2020   Procedure: ESOPHAGOGASTRODUODENOSCOPY (EGD);  Surgeon: Elicia Claw, MD;  Location: [facility] ENDOSCOPY;  Service: Gastroenterology;  Laterality: N/A;   ESOPHAGOGASTRODUODENOSCOPY (EGD) WITH PROPOFOL  N/A 05/23/2017   Procedure: ESOPHAGOGASTRODUODENOSCOPY (EGD) WITH PROPOFOL ;  Surgeon: Lennard Lesta FALCON, MD;  Location: [facility] ENDOSCOPY;  Service: Endoscopy;  Laterality: N/A;   IR KYPHO LUMBAR INC FX REDUCE BONE BX UNI/BIL CANNULATION INC/IMAGING  06/06/2017   IR RADIOLOGIST EVAL & MGMT  07/01/2017   RADICAL HYSTERECTOMY     REPLACEMENT TOTAL KNEE     11/03/07 right....... 03/29/08 left   Family History  Problem Relation Age of Onset   Arrhythmia Mother        afib   Alzheimer's disease Mother    Stroke Mother    Colon cancer Father    Heart attack Father 49   Diabetes Sister  Arrhythmia Sister        afib   Stroke Maternal Grandmother    Social History   Socioeconomic History   Marital status: Divorced    Spouse name: Not on file   Number of children: 1   Years of education: 12   Highest education level: 12th grade  Occupational History   Occupation: Retired    Associate Professor: RETIRED  Tobacco Use   Smoking status: Former    Current packs/day: 0.00    Types: Cigarettes    Quit date: 10/08/1970    Years since quitting: 53.6   Smokeless tobacco: Never  Vaping Use   Vaping status: Never Used  Substance and Sexual Activity   Alcohol  use: No   Drug use: No   Sexual activity: Not on file  Other Topics Concern   Not on file  Social History Narrative   Lives by herself at home, next to her daughter. She enjoys reading. She doesn't read as much anymore.    Social Drivers of Corporate investment banker Strain: Low Risk  (05/14/2024)   Overall Financial Resource Strain (CARDIA)    Difficulty of Paying Living Expenses: Not hard at all  Food Insecurity: No Food Insecurity (05/14/2024)   Hunger Vital Sign     Worried About Running Out of Food in the Last Year: Never true    Ran Out of Food in the Last Year: Never true  Transportation Needs: No Transportation Needs (05/14/2024)   PRAPARE - Administrator, Civil Service (Medical): No    Lack of Transportation (Non-Medical): No  Physical Activity: Inactive (05/14/2024)   Exercise Vital Sign    Days of Exercise per Week: 0 days    Minutes of Exercise per Session: 0 min  Stress: No Stress Concern Present (05/14/2024)   [employer] of Occupational Health - Occupational Stress Questionnaire    Feeling of Stress: Not at all  Social Connections: Socially Isolated (05/14/2024)   Social Connection and Isolation Panel    Frequency of Communication with Friends and Family: Three times a week    Frequency of Social Gatherings with Friends and Family: Once a week    Attends Religious Services: Never    Database administrator or Organizations: No    Attends Engineer, structural: Never    Marital Status: Divorced    Tobacco Counseling Counseling given: Not Answered   Clinical Intake:  Pre-visit preparation completed: Yes  Pain : 0-10 Pain Score: 6  Pain Type: Chronic pain Pain Location: Knee Pain Descriptors / Indicators: Stabbing Pain Onset: More than a month ago Pain Frequency: Intermittent Pain Relieving Factors: tylenol   Pain Relieving Factors: tylenol   BMI - recorded: 53.41 Nutritional Status: BMI > 30  Obese Nutritional Risks: None Diabetes: No  How often do you need to have someone help you when you read instructions, pamphlets, or other written materials from your doctor or pharmacy?: 1 - Never What is the last grade level you completed in school?: 12  Interpreter Needed?: No      Activities of Daily Living    05/14/2024   11:08 AM 05/12/2024   11:15 AM  In your present state of health, do you have any difficulty performing the following activities:  Hearing? 0 0  Vision? 0 0  Difficulty concentrating or  making decisions? 0 0  Walking or climbing stairs? 1 1  Dressing or bathing? 1 1  Doing errands, shopping? 1 1  Preparing Food and eating ?  Y Y  Using the Toilet? Y Y  In the past six months, have you accidently leaked urine? Y Y  Do you have problems with loss of bowel control? Y Y  Managing your Medications? Y Y  Managing your Finances? Y Y  Housekeeping or managing your Housekeeping? CINDERELLA CINDERELLA    Patient Care Team: Alvan Dorothyann BIRCH, MD as PCP - General Pietro Redell RAMAN, MD as PCP - Cardiology (Cardiology) Gennie Dover, MD as Referring Physician (Ophthalmology) Elnor Mulders, MD as Referring Physician (Ophthalmology) Cary No, NP as Nurse Practitioner (Neurology)  Indicate any recent Medical Services you may have received from other than [facility] providers in the past year (date may be approximate).     Assessment:   This is a routine wellness examination for [place].  Hearing/Vision screen No results found.   Goals Addressed             This Visit's Progress    Patient Stated       Patient states she would like to lose weight.        Depression Screen    05/14/2024   11:12 AM 03/03/2024    3:16 PM 05/07/2023   11:11 AM 12/18/2022    1:54 PM 08/14/2022    3:46 PM 05/02/2022   11:06 AM 11/14/2021    3:05 PM  PHQ 2/9 Scores  PHQ - 2 Score 1 4 0 3 2 0 0  PHQ- 9 Score  7  8 5       Fall Risk    05/14/2024   11:14 AM 05/12/2024   11:15 AM 05/07/2023   11:11 AM 12/18/2022    1:31 PM 05/02/2022   11:05 AM  Fall Risk   Falls in the past year? 1 1 1 1 1   Number falls in past yr: 0 0 1 1 1   Injury with Fall? 0 0 0 1 1  Risk for fall due to : Impaired mobility;Impaired balance/gait  History of fall(s);Impaired mobility History of fall(s);Impaired mobility Impaired balance/gait;History of fall(s)  Follow up Falls evaluation completed  Falls evaluation completed;Education provided;Falls prevention discussed Falls evaluation completed;Falls prevention discussed;Education  provided Falls evaluation completed;Education provided;Falls prevention discussed      Data saved with a previous flowsheet row definition    MEDICARE RISK AT HOME: Medicare Risk at Home Any stairs in or around the home?: No If so, are there any without handrails?: No Home free of loose throw rugs in walkways, pet beds, electrical cords, etc?: Yes Adequate lighting in your home to reduce risk of falls?: Yes Life alert?: No Use of a cane, walker or w/c?: Yes Grab bars in the bathroom?: Yes Shower chair or bench in shower?: Yes Elevated toilet seat or a handicapped toilet?: Yes  TIMED UP AND GO:  Was the test performed?  No    Cognitive Function:        05/14/2024   11:14 AM 05/07/2023   11:16 AM 05/02/2022   11:11 AM 02/17/2021    2:44 PM  6CIT Screen  What Year? 0 points 0 points 0 points 0 points  What month? 0 points 0 points 0 points 0 points  What time? 0 points 0 points 0 points 0 points  Count back from 20 0 points 0 points 0 points 0 points  Months in reverse 0 points 0 points 0 points 0 points  Repeat phrase 0 points 0 points 0 points 0 points  Total Score 0 points 0 points 0 points 0 points  Immunizations Immunization History  Administered Date(s) Administered   Fluad Quad(high Dose 65+) 06/16/2020, 06/06/2021, 08/14/2022   Fluad Trivalent(High Dose 65+) 07/02/2023   Influenza Whole 08/07/2006, 08/05/2007, 07/08/2009, 07/25/2009, 06/30/2010   Influenza, High Dose Seasonal PF 07/24/2016, 07/04/2017, 07/13/2019   Influenza,inj,Quad PF,6+ Mos 06/11/2013, 06/18/2014, 06/20/2015   Influenza-Unspecified 08/11/2018   Moderna Sars-Covid-2 Vaccination 12/06/2019, 01/09/2020   PFIZER(Purple Top)SARS-COV-2 Vaccination 09/10/2020   Pfizer Covid-19 Vaccine Bivalent Booster 74yrs & up 12/23/2021   Pfizer(Comirnaty)Fall Seasonal Vaccine 12 years and older 08/14/2022, 11/05/2023   Pneumococcal Conjugate-13 07/19/2014   Pneumococcal Polysaccharide-23 10/08/2002, 02/25/2017    Respiratory Syncytial Virus Vaccine,Recomb Aduvanted(Arexvy) 08/20/2023   Td 07/25/2009   Tdap 12/23/2021   Zoster Recombinant(Shingrix) 10/28/2018, 04/30/2019   Zoster, Live 09/21/2009    TDAP status: Up to date  Flu Vaccine status: Up to date  Pneumococcal vaccine status: Up to date  Covid-19 vaccine status: Completed vaccines  Qualifies for Shingles Vaccine? Yes   Zostavax completed Yes   Shingrix Completed?: Yes  Screening Tests Health Maintenance  Topic Date Due   OPHTHALMOLOGY EXAM  06/13/2023   COVID-19 Vaccine (7 - 2024-25 season) 05/04/2024   INFLUENZA VACCINE  05/08/2024   FOOT EXAM  07/01/2024   HEMOGLOBIN A1C  09/03/2024   Medicare Annual Wellness (AWV)  05/14/2025   DEXA SCAN  03/18/2029   DTaP/Tdap/Td (3 - Td or Tdap) 12/24/2031   Pneumococcal Vaccine: 50+ Years  Completed   Zoster Vaccines- Shingrix  Completed   Hepatitis B Vaccines  Aged Out   HPV VACCINES  Aged Out   Meningococcal B Vaccine  Aged Out    Health Maintenance  Health Maintenance Due  Topic Date Due   OPHTHALMOLOGY EXAM  06/13/2023   COVID-19 Vaccine (7 - 2024-25 season) 05/04/2024   INFLUENZA VACCINE  05/08/2024    Colorectal cancer screening: No longer required.   Mammogram status: No longer required due to age.  Bone Density status: Completed 03/18/2024. Results reflect: Bone density results: OSTEOPOROSIS. Repeat every 2 years.  Lung Cancer Screening: (Low Dose CT Chest recommended if Age 68-80 years, 20 pack-year currently smoking OR have quit w/in 15years.) does not qualify.   Lung Cancer Screening Referral: n/a  Additional Screening:  Hepatitis C Screening: does not qualify; Completed   Vision Screening: Recommended annual ophthalmology exams for early detection of glaucoma and other disorders of the eye. Is the patient up to date with their annual eye exam?  Yes  Who is the provider or what is the name of the office in which the patient attends annual eye exams?  [facility]. If pt is not established with a provider, would they like to be referred to a provider to establish care? N/a.   Dental Screening: Recommended annual dental exams for proper oral hygiene  Diabetic Foot Exam: Diabetic Foot Exam: Completed 07/02/2023  Community Resource Referral / Chronic Care Management: CRR required this visit?  No   CCM required this visit?  No     Plan:     I have personally reviewed and noted the following in the patient's chart:   Medical and social history Use of alcohol , tobacco or illicit drugs  Current medications and supplements including opioid prescriptions. Patient is not currently taking opioid prescriptions. Functional ability and status Nutritional status Physical activity Advanced directives List of other physicians Hospitalizations, surgeries, and ER visits in previous 12 months. None Vitals Screenings to include cognitive, depression, and falls Referrals and appointments  In addition, I have reviewed and discussed  with patient certain preventive protocols, quality metrics, and best practice recommendations. A written personalized care plan for preventive services as well as general preventive health recommendations were provided to patient.     Bonny Jon Mayor, CMA   05/14/2024   After Visit Summary: (MyChart) Due to this being a telephonic visit, the after visit summary with patients personalized plan was offered to patient via MyChart   Nurse Notes:   ALYSSA ROTONDO is a 88 y.o. female patient of Metheney, Dorothyann BIRCH, MD who had a Medicare Annual Wellness Visit today via telephone. Ferol is Retired and lives alone. she has 1 child. She reports that she is socially active and does interact with friends/family regularly. She is minimally physically active.

## 2024-05-14 NOTE — Patient Instructions (Signed)
  Charlotte [name] , Thank you for taking time to come for your Medicare Wellness Visit. I appreciate your ongoing commitment to your health goals. Please review the following plan we discussed and let me know if I can assist you in the future.   These are the goals we discussed:  Goals       Patient Stated (pt-stated)      02/17/2021 AWV Goal: Exercise for General Health  Patient will verbalize understanding of the benefits of increased physical activity: Exercising regularly is important. It will improve your overall fitness, flexibility, and endurance. Regular exercise also will improve your overall health. It can help you control your weight, reduce stress, and improve your bone density. Over the next year, patient will increase physical activity as tolerated with a goal of at least 150 minutes of moderate physical activity per week.  You can tell that you are exercising at a moderate intensity if your heart starts beating faster and you start breathing faster but can still hold a conversation. Moderate-intensity exercise ideas include: Walking 1 mile (1.6 km) in about 15 minutes Biking Hiking Golfing Dancing Water aerobics Patient will verbalize understanding of everyday activities that increase physical activity by providing examples like the following: Yard work, such as: Insurance underwriter Gardening Washing windows or floors Patient will be able to explain general safety guidelines for exercising:  Before you start a new exercise program, talk with your health care provider. Do not exercise so much that you hurt yourself, feel dizzy, or get very short of breath. [place] comfortable clothes and Laws shoes with good support. Drink plenty of water while you exercise to prevent dehydration or heat stroke. Work out until your breathing and your heartbeat get faster.       Patient Stated (pt-stated)       Patient states she just wants to feel better.      Patient Stated (pt-stated)      Patient stated that she would like to loose weight.      Patient Stated      Patient states she would like to lose weight.         This is a list of the screening recommended for you and due dates:  Health Maintenance  Topic Date Due   Eye exam for diabetics  06/13/2023   COVID-19 Vaccine (7 - 2024-25 season) 05/04/2024   Flu Shot  05/08/2024   Complete foot exam   07/01/2024   Hemoglobin A1C  09/03/2024   Medicare Annual Wellness Visit  05/14/2025   DEXA scan (bone density measurement)  03/18/2029   DTaP/Tdap/Td vaccine (3 - Td or Tdap) 12/24/2031   Pneumococcal Vaccine for age over 83  Completed   Zoster (Shingles) Vaccine  Completed   Hepatitis B Vaccine  Aged Out   HPV Vaccine  Aged Out   Meningitis B Vaccine  Aged Out

## 2024-05-22 ENCOUNTER — Other Ambulatory Visit: Payer: Self-pay | Admitting: Family Medicine

## 2024-05-22 DIAGNOSIS — K279 Peptic ulcer, site unspecified, unspecified as acute or chronic, without hemorrhage or perforation: Secondary | ICD-10-CM

## 2024-06-22 ENCOUNTER — Other Ambulatory Visit: Payer: Self-pay

## 2024-06-22 DIAGNOSIS — M5459 Other low back pain: Secondary | ICD-10-CM | POA: Diagnosis not present

## 2024-06-22 DIAGNOSIS — Z79899 Other long term (current) drug therapy: Secondary | ICD-10-CM | POA: Diagnosis not present

## 2024-06-22 DIAGNOSIS — M545 Low back pain, unspecified: Secondary | ICD-10-CM | POA: Diagnosis not present

## 2024-06-22 DIAGNOSIS — G2581 Restless legs syndrome: Secondary | ICD-10-CM | POA: Diagnosis not present

## 2024-06-22 DIAGNOSIS — I351 Nonrheumatic aortic (valve) insufficiency: Secondary | ICD-10-CM | POA: Insufficient documentation

## 2024-06-22 LAB — CBC WITH DIFFERENTIAL/PLATELET
Abs Immature Granulocytes: 0.03 K/uL (ref 0.00–0.07)
Basophils Absolute: 0 K/uL (ref 0.0–0.1)
Basophils Relative: 0 %
Eosinophils Absolute: 0.1 K/uL (ref 0.0–0.5)
Eosinophils Relative: 1 %
HCT: 42.1 % (ref 36.0–46.0)
Hemoglobin: 13.9 g/dL (ref 12.0–15.0)
Immature Granulocytes: 0 %
Lymphocytes Relative: 23 %
Lymphs Abs: 1.9 K/uL (ref 0.7–4.0)
MCH: 30.3 pg (ref 26.0–34.0)
MCHC: 33 g/dL (ref 30.0–36.0)
MCV: 91.7 fL (ref 80.0–100.0)
Monocytes Absolute: 0.6 K/uL (ref 0.1–1.0)
Monocytes Relative: 8 %
Neutro Abs: 5.6 K/uL (ref 1.7–7.7)
Neutrophils Relative %: 68 %
Platelets: 217 K/uL (ref 150–400)
RBC: 4.59 MIL/uL (ref 3.87–5.11)
RDW: 13.7 % (ref 11.5–15.5)
WBC: 8.3 K/uL (ref 4.0–10.5)
nRBC: 0 % (ref 0.0–0.2)

## 2024-06-22 LAB — COMPREHENSIVE METABOLIC PANEL WITH GFR
ALT: 13 U/L (ref 0–44)
AST: 20 U/L (ref 15–41)
Albumin: 4.2 g/dL (ref 3.5–5.0)
Alkaline Phosphatase: 108 U/L (ref 38–126)
Anion gap: 15 (ref 5–15)
BUN: 26 mg/dL — ABNORMAL HIGH (ref 8–23)
CO2: 21 mmol/L — ABNORMAL LOW (ref 22–32)
Calcium: 9.8 mg/dL (ref 8.9–10.3)
Chloride: 101 mmol/L (ref 98–111)
Creatinine, Ser: 1.27 mg/dL — ABNORMAL HIGH (ref 0.44–1.00)
GFR, Estimated: 41 mL/min — ABNORMAL LOW (ref >60)
Glucose, Bld: 117 mg/dL — ABNORMAL HIGH (ref 70–99)
Potassium: 4.4 mmol/L (ref 3.5–5.1)
Sodium: 136 mmol/L (ref 135–145)
Total Bilirubin: 0.6 mg/dL (ref 0.0–1.2)
Total Protein: 7.9 g/dL (ref 6.5–8.1)

## 2024-06-22 LAB — URINALYSIS, W/ REFLEX TO CULTURE (INFECTION SUSPECTED)
Bacteria, UA: NONE SEEN
Bilirubin Urine: NEGATIVE
Glucose, UA: NEGATIVE mg/dL
Hgb urine dipstick: NEGATIVE
Ketones, ur: NEGATIVE mg/dL
Nitrite: NEGATIVE
Protein, ur: NEGATIVE mg/dL
Specific Gravity, Urine: 1.017 (ref 1.005–1.030)
pH: 5 (ref 5.0–8.0)

## 2024-06-22 NOTE — ED Triage Notes (Signed)
 Pt POV reporting lower back pain x1 week, worse on right, denies dysuria or increased frequency. Daughter also reports increased weakness.

## 2024-06-22 NOTE — ED Notes (Signed)
 Pt advised that urine sample is needed, brought pt to back and placed 2 hats in toilet to try and collect urine.  Delay explained.

## 2024-06-23 ENCOUNTER — Emergency Department (HOSPITAL_BASED_OUTPATIENT_CLINIC_OR_DEPARTMENT_OTHER)

## 2024-06-23 ENCOUNTER — Emergency Department (HOSPITAL_BASED_OUTPATIENT_CLINIC_OR_DEPARTMENT_OTHER)
Admission: EM | Admit: 2024-06-23 | Discharge: 2024-06-23 | Disposition: A | Discharge status: Home / Self Care | Attending: Emergency Medicine | Admitting: Emergency Medicine

## 2024-06-23 DIAGNOSIS — M4856XA Collapsed vertebra, not elsewhere classified, lumbar region, initial encounter for fracture: Secondary | ICD-10-CM | POA: Diagnosis not present

## 2024-06-23 DIAGNOSIS — M47814 Spondylosis without myelopathy or radiculopathy, thoracic region: Secondary | ICD-10-CM | POA: Diagnosis not present

## 2024-06-23 DIAGNOSIS — M47816 Spondylosis without myelopathy or radiculopathy, lumbar region: Secondary | ICD-10-CM | POA: Diagnosis not present

## 2024-06-23 DIAGNOSIS — M5136 Other intervertebral disc degeneration, lumbar region with discogenic back pain only: Secondary | ICD-10-CM | POA: Diagnosis not present

## 2024-06-23 DIAGNOSIS — M545 Low back pain, unspecified: Secondary | ICD-10-CM

## 2024-06-23 MED ORDER — HYDROCODONE-ACETAMINOPHEN 5-325 MG PO TABS
2.0000 | ORAL_TABLET | Freq: Once | ORAL | Status: AC
  Administered 2024-06-23: 2 via ORAL
  Filled 2024-06-23: qty 2

## 2024-06-23 MED ORDER — HYDROCODONE-ACETAMINOPHEN 5-325 MG PO TABS: 1.0000 | ORAL_TABLET | Freq: Four times a day (QID) | ORAL | 0 refills | Status: DC | PRN

## 2024-06-23 MED ORDER — CEPHALEXIN 250 MG PO CAPS
500.0000 mg | ORAL_CAPSULE | Freq: Once | ORAL | Status: AC
  Administered 2024-06-23: 500 mg via ORAL
  Filled 2024-06-23: qty 2

## 2024-06-23 MED ORDER — CEPHALEXIN 500 MG PO CAPS: 500.0000 mg | ORAL_CAPSULE | Freq: Three times a day (TID) | ORAL | 0 refills | Status: DC

## 2024-06-23 NOTE — Discharge Instructions (Addendum)
 Begin taking hydrocodone  as prescribed as needed for pain.  Begin taking Keflex  as prescribed.  Rest.  Follow-up with primary doctor if not improving in the next week.

## 2024-06-23 NOTE — ED Provider Notes (Signed)
 [place] [facility]MENT AT [facility] Provider Note   CSN: [id number] Arrival date & time: 06/22/24  1933     Patient presents with: Back Pain   Charlotte [name] is a 88 y.o. female.   Patient is an 88 year old female with history of prior compression fracture requiring kyphoplasty, anemia, aortic regurgitation, restless legs.  Patient presenting today with complaints of back pain.  She went to bed just over 1 week ago, then woke up with pain to her left lower back the next morning.  This pain has been persistent and unrelieved with Tylenol .  She denies any radiation of the pain into her legs.  She denies any bowel or bladder complaints.       Prior to Admission medications   Medication Sig Start Date End Date Taking? Authorizing Provider  acetaminophen  (TYLENOL ) 500 MG tablet Take 1,000 mg by mouth in the morning and at bedtime.    [provider]  alendronate  (FOSAMAX ) 70 MG tablet Take 1 tablet (70 mg total) by mouth every 7 (seven) days. Take with a full glass of water on an empty stomach. 10/21/23   Alvan Dorothyann BIRCH, MD  atorvastatin  (LIPITOR) 40 MG tablet TAKE 1 TABLET BY MOUTH DAILY 12/23/23   Alvan Dorothyann BIRCH, MD  carvedilol  (COREG ) 12.5 MG tablet Take 1 tablet (12.5 mg total) by mouth 2 (two) times daily with a meal. 05/04/24   Pietro Redell RAMAN, MD  DULoxetine  (CYMBALTA ) 30 MG capsule TAKE 1 CAPSULE BY MOUTH DAILY 12/23/23   Alvan Dorothyann BIRCH, MD  Ferrous Sulfate  (IRON  PO) Take 1 tablet by mouth every other day.    [provider]  fluticasone  (FLONASE ) 50 MCG/ACT nasal spray Place 1 spray into both nostrils daily. 10/21/23   Alvan Dorothyann BIRCH, MD  furosemide  (LASIX ) 40 MG tablet TAKE 1 TABLET BY MOUTH TWICE  DAILY 12/23/23   Metheney, Catherine D, MD  Multiple Vitamins-Minerals (SYSTANE ICAPS AREDS2) CAPS Take 1 capsule by mouth daily.    [provider]  nystatin  (MYCOSTATIN /NYSTOP ) powder Apply topically 2 (two) times  daily. 05/04/24   Alvan Dorothyann BIRCH, MD  nystatin  cream (MYCOSTATIN ) APPLY TO AFFECTED AREA TWICE A DAY 05/04/24   Alvan Dorothyann BIRCH, MD  omeprazole  (PRILOSEC) 40 MG capsule TAKE 1 CAPSULE BY MOUTH TWICE  DAILY IN THE MORNING AND AT  BEDTIME 05/25/24   Alvan Dorothyann BIRCH, MD  Potassium Chloride  ER 20 MEQ TBCR TAKE 1 TABLET BY MOUTH TWICE  DAILY 03/30/24   Alvan Dorothyann BIRCH, MD  rivaroxaban  (XARELTO ) 20 MG TABS tablet TAKE 1 TABLET BY MOUTH DAILY  WITH SUPPER 12/30/23   Pietro Redell RAMAN, MD  sacubitril -valsartan  (ENTRESTO ) 24-26 MG Take 1 tablet by mouth 2 (two) times daily. 12/06/23   Pietro Redell RAMAN, MD  spironolactone  (ALDACTONE ) 25 MG tablet Take 0.5 tablets (12.5 mg total) by mouth daily. 10/21/23 10/20/24  Pietro Redell RAMAN, MD  triamcinolone  cream (KENALOG ) 0.1 % APPLY 1 TO 2 APPLICATIONS  TOPICALLY TWICE DAILY ; GIVE  SKIN A BREAK AFTER 2 WEEKS 04/28/24   Alvan Dorothyann BIRCH, MD    Allergies: Metformin  and related and Amlodipine    Review of Systems  All other systems reviewed and are negative.   Updated Vital Signs BP (!) 153/98 (BP Location: Left Arm)   Pulse 98   Temp 98.1 F (36.7 C) (Oral)   Resp 20   Ht 5' 2 (1.575 m)   Wt 133.4 kg   SpO2 96%   BMI 53.77  kg/m   Physical Exam Vitals and nursing note reviewed.  Constitutional:      General: She is not in acute distress.    Appearance: She is well-developed. She is not diaphoretic.  HENT:     Head: Normocephalic and atraumatic.  Cardiovascular:     Rate and Rhythm: Normal rate and regular rhythm.     Heart sounds: No murmur heard.    No friction rub. No gallop.  Pulmonary:     Effort: Pulmonary effort is normal. No respiratory distress.     Breath sounds: Normal breath sounds. No wheezing.  Abdominal:     General: Bowel sounds are normal. There is no distension.     Palpations: Abdomen is soft.     Tenderness: There is no abdominal tenderness.  Musculoskeletal:        General: Normal range of  motion.     Cervical back: Normal range of motion and neck supple.  Skin:    General: Skin is warm and dry.  Neurological:     General: No focal deficit present.     Mental Status: She is alert and oriented to person, place, and time.     Comments: Strength is 5 out of 5 in both lower extremities and sensation is intact throughout both lower extremities.     (all labs ordered are listed, but only abnormal results are displayed) Labs Reviewed  COMPREHENSIVE METABOLIC PANEL WITH GFR - Abnormal; Notable for the following components:      Result Value   CO2 21 (*)    Glucose, Bld 117 (*)    BUN 26 (*)    Creatinine, Ser 1.27 (*)    GFR, Estimated 41 (*)    All other components within normal limits  URINALYSIS, W/ REFLEX TO CULTURE (INFECTION SUSPECTED) - Abnormal; Notable for the following components:   Leukocytes,Ua MODERATE (*)    All other components within normal limits  URINE CULTURE  CBC WITH DIFFERENTIAL/PLATELET    EKG: None  Radiology: No results found.   Procedures   Medications Ordered in the ED - No data to display                                  Medical Decision Making Amount and/or Complexity of Data Reviewed Labs: ordered. Radiology: ordered.  Risk Prescription drug management.   Patient is an 88 year old female presenting with complaints of low back pain as described in the HPI.  Patient arrives here with stable vital signs and is afebrile.  There is some tenderness in the soft tissues of the left lower lumbar region, but no bony tenderness or palpable abnormality.  She is neurovascularly intact to both lower extremities.  Laboratory studies obtained including CBC, CMP, both of which are unremarkable.  Urinalysis showing moderate leukocytes but negative nitrate.  CT scan of the thoracic and lumbar spine showing degenerative changes, but no acute compression fracture or other abnormality.  Patient given hydrocodone  for pain and seems to be feeling  somewhat better.  At this point, nothing appears emergent and suspect a muscular etiology.  I will prescribe pain medication and recommend follow-up in 1 week with primary doctor if not improving.  There are no red flags that would suggest an emergent situation based on history, exam, and radiographic findings.  Her urinalysis is equivocal for UTI, so we will treat with Keflex .     Final diagnoses:  None  ED Discharge Orders     None          Geroldine Berg, MD 06/23/24 [phone]

## 2024-06-24 LAB — URINE CULTURE

## 2024-07-07 ENCOUNTER — Ambulatory Visit (INDEPENDENT_AMBULATORY_CARE_PROVIDER_SITE_OTHER): Admitting: Family Medicine

## 2024-07-07 ENCOUNTER — Encounter: Payer: Self-pay | Admitting: Family Medicine

## 2024-07-07 DIAGNOSIS — I10 Essential (primary) hypertension: Secondary | ICD-10-CM

## 2024-07-07 DIAGNOSIS — K2100 Gastro-esophageal reflux disease with esophagitis, without bleeding: Secondary | ICD-10-CM

## 2024-07-07 DIAGNOSIS — Z23 Encounter for immunization: Secondary | ICD-10-CM

## 2024-07-07 DIAGNOSIS — K279 Peptic ulcer, site unspecified, unspecified as acute or chronic, without hemorrhage or perforation: Secondary | ICD-10-CM

## 2024-07-07 DIAGNOSIS — R7989 Other specified abnormal findings of blood chemistry: Secondary | ICD-10-CM

## 2024-07-07 DIAGNOSIS — E1169 Type 2 diabetes mellitus with other specified complication: Secondary | ICD-10-CM | POA: Diagnosis not present

## 2024-07-07 DIAGNOSIS — E782 Mixed hyperlipidemia: Secondary | ICD-10-CM | POA: Diagnosis not present

## 2024-07-07 LAB — POCT GLYCOSYLATED HEMOGLOBIN (HGB A1C): Hemoglobin A1C: 6.3 % — AB (ref 4.0–5.6)

## 2024-07-07 MED ORDER — OMEPRAZOLE 40 MG PO CPDR: 40.0000 mg | DELAYED_RELEASE_CAPSULE | Freq: Every day | ORAL | 0 refills | Status: AC

## 2024-07-07 NOTE — Progress Notes (Signed)
 Established Patient Office Visit  Subjective  Patient ID: Charlotte [name], female    DOB: 07-20-1936  Age: 88 y.o. MRN: [id number]  Chief Complaint  Patient presents with   Diabetes   Hypertension    HPI  Discussed the use of AI scribe software for clinical note transcription with the patient, who gave verbal consent to proceed.  History of Present Illness Charlotte [name] is an 88 year old female who presents for follow-up after a recent fall and backache. She is accompanied by a caregiver who assists her at home.  Fall and gait stability - Experienced a fall on July 8th while getting into the shower due to slipping on an unsecured mat - Fell against caregiver's knees, did not sustain injuries or hit her head - Firemen assisted her to stand, ambulated back to living room without further incident - Home safety measures in place: grab bars, walk-in shower, shower chair  Back pain - Developed backache approximately two weeks ago, unrelated to the prior fall - Presented to emergency room for evaluation - CT lumbar and thoracic spine performed.  - Prescribed pain medication, which resolved symptoms - History of L3 compression fracture treated with cement  Glycemic control - Hemoglobin A1c currently 6.3, increased from previous 6.0 - Attributes increase to high corn intake - No consumption of candy or sweets, but does eat bread and corn  Edema - Swelling has remained unchanged  Medication management and recent antibiotic use - Takes Entresto , received through patient assistance program - Takes Xarelto , affordable at last refill - Takes omeprazole  twice daily for many years, recently reduced to one a day and doing well.  - Treated for urinary tract infection in emergency room - Underwent tooth extraction last week, resulting in two weeks of antibiotic therapy      ROS    Objective:     BP 120/76   Pulse 76   Ht 5' 2 (1.575 m)   Wt 294 lb (133.4 kg)   SpO2 97%   BMI  53.77 kg/m    Physical Exam Vitals and nursing note reviewed.  Constitutional:      Appearance: Normal appearance.  HENT:     Head: Normocephalic and atraumatic.  Eyes:     Conjunctiva/sclera: Conjunctivae normal.  Cardiovascular:     Rate and Rhythm: Normal rate and regular rhythm.  Pulmonary:     Effort: Pulmonary effort is normal.     Breath sounds: Normal breath sounds.  Skin:    General: Skin is warm and dry.  Neurological:     Mental Status: She is alert.  Psychiatric:        Mood and Affect: Mood normal.      Results for orders placed or performed in visit on 07/07/24  POCT HgB A1C  Result Value Ref Range   Hemoglobin A1C 6.3 (A) 4.0 - 5.6 %   HbA1c POC (<> result, manual entry)     HbA1c, POC (prediabetic range)     HbA1c, POC (controlled diabetic range)        The ASCVD Risk score (Arnett DK, et al., 2019) failed to calculate for the following reasons:   The 2019 ASCVD risk score is only valid for ages 43 to 36   Risk score cannot be calculated because patient has a medical history suggesting prior/existing ASCVD    Assessment & Plan:   Problem List Items Addressed This Visit       Cardiovascular and Mediastinum   Essential  hypertension   Stable. No changes.         Digestive   GASTROESOPHAGEAL REFLUX, NO ESOPHAGITIS    Gastroesophageal reflux disease (GERD) Symptom control achieved with omeprazole  once daily. Long-term high doses pose risks. - Adjust omeprazole  to once daily. - Consider reducing to every other day or dec to 20mg  if symptoms controlled.      Relevant Medications   omeprazole  (PRILOSEC) 40 MG capsule     Endocrine   Type 2 diabetes mellitus associated with morbid obesity (HCC)   A1c at 6.3, slightly increased but within acceptable range. - Monitor A1c levels regularly. - Advise moderation in starchy food consumption.      Relevant Orders   POCT HgB A1C (Completed)   CMP14+EGFR     Other   Morbid obesity (HCC) -  Primary   Other Visit Diagnoses       Encounter for immunization       Relevant Orders   Flu vaccine HIGH DOSE PF(Fluzone Trivalent) (Completed)     Peptic ulcer disease       Relevant Medications   omeprazole  (PRILOSEC) 40 MG capsule     Elevated serum creatinine       Relevant Orders   CMP14+EGFR     Encounter for immunization       Relevant Orders   Pfizer Comirnaty Covid-19 Vaccine 14yrs & older (Completed)      Assessment and Plan Assessment & Plan   Edema Stable, no changes.  Vertebral compression fracture, status post vertebroplasty Recent backache muscular, no new fractures.  Tooth extraction Completed antibiotics post-extraction.  Recent labs showed elevated Cr at the ED. Recommend repeat today.   Return in about 18 weeks (around 11/10/2024) for Diabetes follow-up, Hypertension.    Dorothyann Byars, MD

## 2024-07-07 NOTE — Assessment & Plan Note (Signed)
 A1c at 6.3, slightly increased but within acceptable range. - Monitor A1c levels regularly. - Advise moderation in starchy food consumption.

## 2024-07-07 NOTE — Assessment & Plan Note (Signed)
 Stable No changes

## 2024-07-07 NOTE — Assessment & Plan Note (Addendum)
  Gastroesophageal reflux disease (GERD) Symptom control achieved with omeprazole  once daily. Long-term high doses pose risks. - Adjust omeprazole  to once daily. - Consider reducing to every other day or dec to 20mg  if symptoms controlled.

## 2024-07-08 ENCOUNTER — Ambulatory Visit: Payer: Self-pay | Admitting: Family Medicine

## 2024-07-08 LAB — CMP14+EGFR
ALT: 17 IU/L (ref 0–32)
AST: 21 IU/L (ref 0–40)
Albumin: 4 g/dL (ref 3.7–4.7)
Alkaline Phosphatase: 116 IU/L (ref 48–129)
BUN/Creatinine Ratio: 23 (ref 12–28)
BUN: 21 mg/dL (ref 8–27)
Bilirubin Total: 0.6 mg/dL (ref 0.0–1.2)
CO2: 21 mmol/L (ref 20–29)
Calcium: 8.9 mg/dL (ref 8.7–10.3)
Chloride: 102 mmol/L (ref 96–106)
Creatinine, Ser: 0.92 mg/dL (ref 0.57–1.00)
Globulin, Total: 2.8 g/dL (ref 1.5–4.5)
Glucose: 95 mg/dL (ref 70–99)
Potassium: 4.5 mmol/L (ref 3.5–5.2)
Sodium: 138 mmol/L (ref 134–144)
Total Protein: 6.8 g/dL (ref 6.0–8.5)
eGFR: 61 mL/min/1.73 (ref >59)

## 2024-07-08 NOTE — Progress Notes (Signed)
 Kidneys look back to normal.

## 2024-08-02 ENCOUNTER — Other Ambulatory Visit: Payer: Self-pay | Admitting: Family Medicine

## 2024-08-04 DIAGNOSIS — H43813 Vitreous degeneration, bilateral: Secondary | ICD-10-CM | POA: Diagnosis not present

## 2024-08-04 DIAGNOSIS — Z961 Presence of intraocular lens: Secondary | ICD-10-CM | POA: Diagnosis not present

## 2024-08-04 DIAGNOSIS — H02834 Dermatochalasis of left upper eyelid: Secondary | ICD-10-CM | POA: Diagnosis not present

## 2024-08-04 DIAGNOSIS — H353131 Nonexudative age-related macular degeneration, bilateral, early dry stage: Secondary | ICD-10-CM | POA: Diagnosis not present

## 2024-08-04 DIAGNOSIS — H527 Unspecified disorder of refraction: Secondary | ICD-10-CM | POA: Diagnosis not present

## 2024-08-04 DIAGNOSIS — H02831 Dermatochalasis of right upper eyelid: Secondary | ICD-10-CM | POA: Diagnosis not present

## 2024-08-04 DIAGNOSIS — E119 Type 2 diabetes mellitus without complications: Secondary | ICD-10-CM | POA: Diagnosis not present

## 2024-08-04 DIAGNOSIS — H04123 Dry eye syndrome of bilateral lacrimal glands: Secondary | ICD-10-CM | POA: Diagnosis not present

## 2024-08-04 LAB — OPHTHALMOLOGY REPORT-SCANNED

## 2024-08-14 ENCOUNTER — Other Ambulatory Visit: Payer: Self-pay | Admitting: Cardiology

## 2024-08-14 DIAGNOSIS — I4891 Unspecified atrial fibrillation: Secondary | ICD-10-CM

## 2024-08-14 NOTE — Telephone Encounter (Signed)
 Xarelto  20mg  refill request received. Pt is 88 years old, weight-133.4kg, Crea-0.92 on 07/07/24, last seen by Damien Braver on 11/12/23, Diagnosis-Afib, CrCl- 92.44 mL/min; Dose is appropriate based on dosing criteria. Will send in refill to requested pharmacy.

## 2024-10-19 ENCOUNTER — Other Ambulatory Visit: Payer: Self-pay | Admitting: Cardiology

## 2024-11-10 ENCOUNTER — Ambulatory Visit: Admitting: Family Medicine

## 2024-12-08 ENCOUNTER — Ambulatory Visit: Admitting: Family Medicine

## 2024-12-15 ENCOUNTER — Ambulatory Visit: Admitting: Cardiology

## 2025-03-02 ENCOUNTER — Ambulatory Visit: Admitting: Family Medicine

## 2025-05-18 ENCOUNTER — Ambulatory Visit
